# Patient Record
Sex: Male | Born: 1955 | Race: Black or African American | Hispanic: No | Marital: Married | Smoking: Current every day smoker
Health system: Southern US, Community
[De-identification: ages and names within clinical notes are randomized; demographics above are authoritative.]

## PROBLEM LIST (undated history)

## (undated) DIAGNOSIS — M1990 Unspecified osteoarthritis, unspecified site: Secondary | ICD-10-CM

## (undated) DIAGNOSIS — D649 Anemia, unspecified: Secondary | ICD-10-CM

## (undated) DIAGNOSIS — F419 Anxiety disorder, unspecified: Secondary | ICD-10-CM

## (undated) DIAGNOSIS — R7303 Prediabetes: Secondary | ICD-10-CM

## (undated) DIAGNOSIS — N186 End stage renal disease: Secondary | ICD-10-CM

## (undated) DIAGNOSIS — F1910 Other psychoactive substance abuse, uncomplicated: Secondary | ICD-10-CM

## (undated) DIAGNOSIS — M549 Dorsalgia, unspecified: Secondary | ICD-10-CM

## (undated) DIAGNOSIS — G4730 Sleep apnea, unspecified: Secondary | ICD-10-CM

## (undated) DIAGNOSIS — J189 Pneumonia, unspecified organism: Secondary | ICD-10-CM

## (undated) DIAGNOSIS — I2510 Atherosclerotic heart disease of native coronary artery without angina pectoris: Secondary | ICD-10-CM

## (undated) DIAGNOSIS — I10 Essential (primary) hypertension: Secondary | ICD-10-CM

## (undated) DIAGNOSIS — U071 COVID-19: Secondary | ICD-10-CM

## (undated) DIAGNOSIS — Z992 Dependence on renal dialysis: Secondary | ICD-10-CM

## (undated) DIAGNOSIS — R0600 Dyspnea, unspecified: Secondary | ICD-10-CM

## (undated) DIAGNOSIS — J449 Chronic obstructive pulmonary disease, unspecified: Secondary | ICD-10-CM

## (undated) DIAGNOSIS — J40 Bronchitis, not specified as acute or chronic: Secondary | ICD-10-CM

## (undated) DIAGNOSIS — I219 Acute myocardial infarction, unspecified: Secondary | ICD-10-CM

## (undated) DIAGNOSIS — N189 Chronic kidney disease, unspecified: Secondary | ICD-10-CM

## (undated) DIAGNOSIS — R011 Cardiac murmur, unspecified: Secondary | ICD-10-CM

## (undated) DIAGNOSIS — K219 Gastro-esophageal reflux disease without esophagitis: Secondary | ICD-10-CM

## (undated) HISTORY — PX: BACK SURGERY: SHX140

---

## 2011-08-12 DIAGNOSIS — K219 Gastro-esophageal reflux disease without esophagitis: Secondary | ICD-10-CM | POA: Insufficient documentation

## 2013-02-22 ENCOUNTER — Ambulatory Visit: Payer: Self-pay

## 2013-02-28 ENCOUNTER — Ambulatory Visit: Payer: Self-pay | Attending: Family Medicine

## 2013-03-25 ENCOUNTER — Ambulatory Visit: Payer: Self-pay

## 2013-05-05 ENCOUNTER — Ambulatory Visit: Payer: No Typology Code available for payment source | Admitting: Family Medicine

## 2013-05-20 ENCOUNTER — Emergency Department (HOSPITAL_COMMUNITY): Payer: No Typology Code available for payment source

## 2013-05-20 ENCOUNTER — Encounter (HOSPITAL_COMMUNITY): Payer: Self-pay | Admitting: Emergency Medicine

## 2013-05-20 ENCOUNTER — Emergency Department (HOSPITAL_COMMUNITY)
Admission: EM | Admit: 2013-05-20 | Discharge: 2013-05-20 | Disposition: A | Discharge status: Home / Self Care | Payer: No Typology Code available for payment source | Attending: Emergency Medicine | Admitting: Emergency Medicine

## 2013-05-20 DIAGNOSIS — R509 Fever, unspecified: Secondary | ICD-10-CM | POA: Insufficient documentation

## 2013-05-20 DIAGNOSIS — R05 Cough: Secondary | ICD-10-CM | POA: Insufficient documentation

## 2013-05-20 DIAGNOSIS — R52 Pain, unspecified: Secondary | ICD-10-CM | POA: Insufficient documentation

## 2013-05-20 DIAGNOSIS — F17200 Nicotine dependence, unspecified, uncomplicated: Secondary | ICD-10-CM | POA: Insufficient documentation

## 2013-05-20 DIAGNOSIS — B9789 Other viral agents as the cause of diseases classified elsewhere: Secondary | ICD-10-CM | POA: Insufficient documentation

## 2013-05-20 DIAGNOSIS — R059 Cough, unspecified: Secondary | ICD-10-CM | POA: Insufficient documentation

## 2013-05-20 DIAGNOSIS — Z8719 Personal history of other diseases of the digestive system: Secondary | ICD-10-CM | POA: Insufficient documentation

## 2013-05-20 DIAGNOSIS — B349 Viral infection, unspecified: Secondary | ICD-10-CM

## 2013-05-20 DIAGNOSIS — Z79899 Other long term (current) drug therapy: Secondary | ICD-10-CM | POA: Insufficient documentation

## 2013-05-20 DIAGNOSIS — I10 Essential (primary) hypertension: Secondary | ICD-10-CM | POA: Insufficient documentation

## 2013-05-20 DIAGNOSIS — R197 Diarrhea, unspecified: Secondary | ICD-10-CM

## 2013-05-20 DIAGNOSIS — Z8709 Personal history of other diseases of the respiratory system: Secondary | ICD-10-CM | POA: Insufficient documentation

## 2013-05-20 HISTORY — DX: Gastro-esophageal reflux disease without esophagitis: K21.9

## 2013-05-20 HISTORY — DX: Essential (primary) hypertension: I10

## 2013-05-20 HISTORY — DX: Dorsalgia, unspecified: M54.9

## 2013-05-20 HISTORY — DX: Bronchitis, not specified as acute or chronic: J40

## 2013-05-20 LAB — COMPREHENSIVE METABOLIC PANEL
ALT: 36 U/L (ref 0–53)
BUN: 31 mg/dL — ABNORMAL HIGH (ref 6–23)
CO2: 22 mEq/L (ref 19–32)
Calcium: 9 mg/dL (ref 8.4–10.5)
Chloride: 95 mEq/L — ABNORMAL LOW (ref 96–112)
Creatinine, Ser: 1.84 mg/dL — ABNORMAL HIGH (ref 0.50–1.35)
GFR calc Af Amer: 46 mL/min — ABNORMAL LOW (ref >90)
GFR calc non Af Amer: 40 mL/min — ABNORMAL LOW (ref >90)
Sodium: 130 mEq/L — ABNORMAL LOW (ref 135–145)
Total Bilirubin: 0.5 mg/dL (ref 0.3–1.2)
Total Protein: 7.6 g/dL (ref 6.0–8.3)

## 2013-05-20 LAB — GI PATHOGEN PANEL BY PCR, STOOL
Campylobacter by PCR: NEGATIVE
E coli (ETEC) LT/ST: NEGATIVE
E coli (STEC): NEGATIVE
G lamblia by PCR: NEGATIVE
Norovirus GI/GII: NEGATIVE
Rotavirus A by PCR: NEGATIVE
Salmonella by PCR: NEGATIVE

## 2013-05-20 LAB — URINE MICROSCOPIC-ADD ON

## 2013-05-20 LAB — URINALYSIS, ROUTINE W REFLEX MICROSCOPIC
Leukocytes, UA: NEGATIVE
Nitrite: NEGATIVE
Protein, ur: 30 mg/dL — AB
Specific Gravity, Urine: 1.02 (ref 1.005–1.030)
Urobilinogen, UA: 1 mg/dL (ref 0.0–1.0)

## 2013-05-20 LAB — CBC WITH DIFFERENTIAL/PLATELET
Eosinophils Absolute: 0 10*3/uL (ref 0.0–0.7)
Eosinophils Relative: 1 % (ref 0–5)
HCT: 40.2 % (ref 39.0–52.0)
Lymphocytes Relative: 12 % (ref 12–46)
Lymphs Abs: 0.5 10*3/uL — ABNORMAL LOW (ref 0.7–4.0)
MCH: 28.5 pg (ref 26.0–34.0)
MCV: 82.4 fL (ref 78.0–100.0)
Monocytes Absolute: 0.3 10*3/uL (ref 0.1–1.0)
Monocytes Relative: 7 % (ref 3–12)
Neutro Abs: 3.2 10*3/uL (ref 1.7–7.7)
Neutrophils Relative %: 79 % — ABNORMAL HIGH (ref 43–77)
RDW: 13.7 % (ref 11.5–15.5)
WBC Morphology: INCREASED
WBC: 4 10*3/uL (ref 4.0–10.5)

## 2013-05-20 MED ORDER — ONDANSETRON 8 MG PO TBDP: 8.0000 mg | ORAL_TABLET | Freq: Three times a day (TID) | ORAL | Status: DC | PRN

## 2013-05-20 MED ORDER — SODIUM CHLORIDE 0.9 % IV BOLUS (SEPSIS)
1000.0000 mL | Freq: Once | INTRAVENOUS | Status: AC
  Administered 2013-05-20: 1000 mL via INTRAVENOUS

## 2013-05-20 NOTE — ED Notes (Signed)
Bed: [id number] Expected date:  Expected time:  Means of arrival:  Comments: EMS/fever >104

## 2013-05-20 NOTE — ED Provider Notes (Signed)
CSN: [id number]     Arrival date & time 05/20/13  0236 History   First MD Initiated Contact with Patient 05/20/13 0254     Chief Complaint  Patient presents with  . Fever  . Diarrhea  . Generalized Body Aches   (Consider location/radiation/quality/duration/timing/severity/associated sxs/prior Treatment) HPI 57 yo male presents to the ER from home via EMS with complaint of fever, body aches, cough, and diarrhea.  Sxs ongoing for the last 3 days.  No travel, no sick contacts.  Pt received his flu shot 7 days ago.  No recent abx.  Pt has been taking tylenol for fevers.  When fever was almost 104, he and wife became concerned.  He reports recent steroid injection for his chronic back pain.  He reports decreased appetite over the last day.  No n/v.  Diffuse abd pain from frequent diarrhea, worse with coughing.  No prior h/o diverticulitis.  No blood in stool.   Past Medical History  Diagnosis Date  . Hypertension   . GERD (gastroesophageal reflux disease)   . Bronchitis   . Back pain    History reviewed. No pertinent past surgical history. History reviewed. No pertinent family history. History  Substance Use Topics  . Smoking status: Current Every Day Smoker    Types: Cigarettes  . Smokeless tobacco: Never Used  . Alcohol Use: 0.6 oz/week    1 Cans of beer per week     Comment: 24 oz can of beer per day    Review of Systems  All other systems reviewed and are negative.    Allergies  Review of patient's allergies indicates no known allergies.  Home Medications   Current Outpatient Rx  Name  Route  Sig  Dispense  Refill  . cloNIDine (CATAPRES) 0.1 MG tablet   Oral   Take 0.1 mg by mouth daily.         Marland Kitchen DM-APAP-CPM (CORICIDIN HBP FLU) 15-500-2 MG TABS   Oral   Take 1 tablet by mouth every 6 (six) hours as needed (flu symptoms).         . enalapril (VASOTEC) 20 MG tablet   Oral   Take 20 mg by mouth 2 (two) times daily.         Marland Kitchen etodolac (LODINE XL) 600 MG 24 hr  tablet   Oral   Take 600 mg by mouth daily.         Marland Kitchen gabapentin (NEURONTIN) 300 MG capsule   Oral   Take 300 mg by mouth 3 (three) times daily.         . hydrochlorothiazide (HYDRODIURIL) 25 MG tablet   Oral   Take 25 mg by mouth daily.          BP 141/66  Pulse 93  Temp(Src) 100.2 F (37.9 C) (Oral)  Resp 18  SpO2 94% Physical Exam  Nursing note and vitals reviewed. Constitutional: He is oriented to person, place, and time. He appears well-developed and well-nourished.  HENT:  Head: Normocephalic and atraumatic.  Right Ear: External ear normal.  Left Ear: External ear normal.  Nose: Nose normal.  Mouth/Throat: Oropharynx is clear and moist.  Eyes: Conjunctivae and EOM are normal. Pupils are equal, round, and reactive to light.  Neck: Normal range of motion. Neck supple. No JVD present. No tracheal deviation present. No thyromegaly present.  Cardiovascular: Normal rate, regular rhythm, normal heart sounds and intact distal pulses.  Exam reveals no gallop and no friction rub.   No murmur  heard. Pulmonary/Chest: Effort normal and breath sounds normal. No stridor. No respiratory distress. He has no wheezes. He has no rales. He exhibits no tenderness.  cough  Abdominal: Soft. He exhibits no distension and no mass. There is tenderness. There is no rebound and no guarding.  Increased bowel sounds, diffuse mild abd pain with palpation  Musculoskeletal: Normal range of motion. He exhibits no edema and no tenderness.  Lymphadenopathy:    He has no cervical adenopathy.  Neurological: He is alert and oriented to person, place, and time. He has normal reflexes. No cranial nerve deficit. He exhibits normal muscle tone. Coordination normal.  Skin: Skin is warm and dry. No rash noted. No erythema. No pallor.  Psychiatric: He has a normal mood and affect. His behavior is normal. Judgment and thought content normal.    ED Course  Procedures (including critical care time) Labs  Review Labs Reviewed  CBC WITH DIFFERENTIAL - Abnormal; Notable for the following:    Neutrophils Relative % 79 (*)    Lymphs Abs 0.5 (*)    All other components within normal limits  COMPREHENSIVE METABOLIC PANEL - Abnormal; Notable for the following:    Sodium 130 (*)    Chloride 95 (*)    Glucose, Bld 115 (*)    BUN 31 (*)    Creatinine, Ser 1.84 (*)    Albumin 3.4 (*)    GFR calc non Af Amer 40 (*)    GFR calc Af Amer 46 (*)    All other components within normal limits  URINALYSIS, ROUTINE W REFLEX MICROSCOPIC - Abnormal; Notable for the following:    APPearance CLOUDY (*)    Bilirubin Urine LARGE (*)    Protein, ur 30 (*)    All other components within normal limits  URINE MICROSCOPIC-ADD ON - Abnormal; Notable for the following:    Casts GRANULAR CAST (*)    All other components within normal limits  CULTURE, BLOOD (ROUTINE X 2)  CULTURE, BLOOD (ROUTINE X 2)  GI PATHOGEN PANEL BY PCR, STOOL   Imaging Review Dg Chest 2 View  05/20/2013   *RADIOLOGY REPORT*  Clinical Data: Fever and cough.  CHEST - 2 VIEW  Comparison: None.  Findings: The lungs are well-aerated and clear.  There is no evidence of focal opacification, pleural effusion or pneumothorax.  The heart is normal in size; the mediastinal contour is within normal limits.  No acute osseous abnormalities are seen. Anterior bridging osteophytes are noted along the thoracic spine, compatible with DISH.  IMPRESSION: No acute cardiopulmonary process seen.   Original Report Authenticated By: Santa Lighter, M.D.    EKG Interpretation   None       MDM   1. Viral syndrome   2. Fever   3. Diarrhea    57 yo male with fever, diarrhea, myalgias.  Will check labs, cxr.  Will give ns bolus.  Fever already improving with tylenol given by EMS  6:02 AM Pt feeling better.  Blood cultures and stool studies pending.  Some increased bands and elevated bun/creatinine.  No prior to compare.  Pt given precautions for return, reports  he is seen at [facility] in [place].    Kalman Drape, MD 05/20/13 [phone]

## 2013-05-20 NOTE — ED Notes (Signed)
Pt c/o fever, body aches, chills, diarrhea which started about 3 days ago. Pt states he had flu vaccine last week. Pt was given tylenol 500 mg po per ems for fever of 103.2. Pt also took 325mg  tylenol at [place] himself with benadryl.

## 2013-05-26 LAB — CULTURE, BLOOD (ROUTINE X 2): Culture: NO GROWTH

## 2013-05-30 ENCOUNTER — Ambulatory Visit: Payer: No Typology Code available for payment source | Attending: Internal Medicine | Admitting: Internal Medicine

## 2013-05-30 ENCOUNTER — Encounter: Payer: Self-pay | Admitting: Internal Medicine

## 2013-05-30 VITALS — BP 156/81 | HR 84 | Temp 98.4°F | Resp 16 | Ht 69.0 in | Wt 201.0 lb

## 2013-05-30 DIAGNOSIS — R52 Pain, unspecified: Secondary | ICD-10-CM

## 2013-05-30 DIAGNOSIS — G609 Hereditary and idiopathic neuropathy, unspecified: Secondary | ICD-10-CM

## 2013-05-30 DIAGNOSIS — G629 Polyneuropathy, unspecified: Secondary | ICD-10-CM | POA: Insufficient documentation

## 2013-05-30 DIAGNOSIS — I10 Essential (primary) hypertension: Secondary | ICD-10-CM | POA: Insufficient documentation

## 2013-05-30 DIAGNOSIS — K029 Dental caries, unspecified: Secondary | ICD-10-CM | POA: Insufficient documentation

## 2013-05-30 DIAGNOSIS — N189 Chronic kidney disease, unspecified: Secondary | ICD-10-CM | POA: Insufficient documentation

## 2013-05-30 DIAGNOSIS — M545 Low back pain: Secondary | ICD-10-CM

## 2013-05-30 DIAGNOSIS — M5412 Radiculopathy, cervical region: Secondary | ICD-10-CM | POA: Insufficient documentation

## 2013-05-30 DIAGNOSIS — G8929 Other chronic pain: Secondary | ICD-10-CM | POA: Insufficient documentation

## 2013-05-30 DIAGNOSIS — I129 Hypertensive chronic kidney disease with stage 1 through stage 4 chronic kidney disease, or unspecified chronic kidney disease: Secondary | ICD-10-CM | POA: Insufficient documentation

## 2013-05-30 MED ORDER — TRAMADOL HCL 50 MG PO TABS: 50.0000 mg | ORAL_TABLET | Freq: Three times a day (TID) | ORAL | Status: DC | PRN

## 2013-05-30 NOTE — Progress Notes (Signed)
Patient ID: Ronald Reeves, male   DOB: 1956-07-05, 57 y.o.   MRN: [id number] Patient Demographics  Ronald Reeves, is a 57 y.o. male  [id number]  [id number]  DOB - Jul 10, 1956  CC:  Chief Complaint  Patient presents with  . Establish Care       HPI: Ronald Reeves is a 57 y.o. male here today to establish medical care. Patient has extensive medical history significant for hypertension, GERD, bronchitis, chronic back pain, chronic kidney disease and peripheral neuropathy. His major concern today is for referral to orthopedic surgery for his low back pain. He has been advised in the past that he needs fusion surgery, he thinks he is ready now. He also wants a referral to a dentist. His current medications are listed below. He smokes about half a pack per day, drinks alcohol daily.  Patient has No headache, No chest pain, No abdominal pain - No Nausea, No new weakness tingling or numbness, No Cough - SOB.  No Known Allergies Past Medical History  Diagnosis Date  . Hypertension   . GERD (gastroesophageal reflux disease)   . Bronchitis   . Back pain    Current Outpatient Prescriptions on File Prior to Visit  Medication Sig Dispense Refill  . cloNIDine (CATAPRES) 0.1 MG tablet Take 0.1 mg by mouth daily.      . enalapril (VASOTEC) 20 MG tablet Take 20 mg by mouth 2 (two) times daily.      Marland Kitchen etodolac (LODINE XL) 600 MG 24 hr tablet Take 600 mg by mouth daily.      Marland Kitchen gabapentin (NEURONTIN) 300 MG capsule Take 300 mg by mouth 3 (three) times daily.      . hydrochlorothiazide (HYDRODIURIL) 25 MG tablet Take 25 mg by mouth daily.      Marland Kitchen DM-APAP-CPM (CORICIDIN HBP FLU) 15-500-2 MG TABS Take 1 tablet by mouth every 6 (six) hours as needed (flu symptoms).      . ondansetron (ZOFRAN ODT) 8 MG disintegrating tablet Take 1 tablet (8 mg total) by mouth every 8 (eight) hours as needed for nausea.  20 tablet  0   No current facility-administered medications on file prior to visit.   History reviewed. No  pertinent family history. History   Social History  . Marital Status: Single    Spouse Name: N/A    Number of Children: N/A  . Years of Education: N/A   Occupational History  . Not on file.   Social History Main Topics  . Smoking status: Current Every Day Smoker -- 0.50 packs/day    Types: Cigarettes  . Smokeless tobacco: Never Used  . Alcohol Use: 0.6 oz/week    1 Cans of beer per week     Comment: 24 oz can of beer per day  . Drug Use: Yes    Special: Cocaine     Comment: pt states he uses crack cocaine at least 2xweek.   Marland Kitchen Sexual Activity: Yes   Other Topics Concern  . Not on file   Social History Narrative  . No narrative on file    Review of Systems: Constitutional: Negative for fever, chills, diaphoresis, activity change, appetite change and fatigue. HENT: Negative for ear pain, nosebleeds, congestion, facial swelling, rhinorrhea, neck pain, neck stiffness and ear discharge.  Eyes: Negative for pain, discharge, redness, itching and visual disturbance. Respiratory: Negative for cough, choking, chest tightness, shortness of breath, wheezing and stridor.  Cardiovascular: Negative for chest pain, palpitations and leg swelling. Gastrointestinal: Negative for abdominal distention.  Genitourinary: Negative for dysuria, urgency, frequency, hematuria, flank pain, decreased urine volume, difficulty urinating and dyspareunia.  Musculoskeletal: Negative for back pain, joint swelling, arthralgia and gait problem. Neurological: Negative for dizziness, tremors, seizures, syncope, facial asymmetry, speech difficulty, weakness, light-headedness, numbness and headaches.  Hematological: Negative for adenopathy. Does not bruise/bleed easily. Psychiatric/Behavioral: Negative for hallucinations, behavioral problems, confusion, dysphoric mood, decreased concentration and agitation.    Objective:   Filed Vitals:   05/30/13 1728  BP: 156/81  Pulse: 84  Temp: 98.4 F (36.9 C)  Resp:  16    Physical Exam: Constitutional: Patient appears well-developed and well-nourished. No distress. HENT: Normocephalic, atraumatic, External right and left ear normal. Oropharynx is clear and moist. Dental caries Eyes: Conjunctivae and EOM are normal. PERRLA, no scleral icterus. Neck: Normal ROM. Neck supple. No JVD. No tracheal deviation. No thyromegaly. CVS: RRR, S1/S2 +, no murmurs, no gallops, no carotid bruit.  Pulmonary: Effort and breath sounds normal, no stridor, rhonchi, wheezes, rales.  Abdominal: Soft. BS +, no distension, tenderness, rebound or guarding.  Musculoskeletal: Point tenderness at the low lumbar spine area  Lymphadenopathy: No lymphadenopathy noted, cervical, inguinal or axillary Neuro: Alert. Normal reflexes, muscle tone coordination. No cranial nerve deficit. Skin: Skin is warm and dry. No rash noted. Not diaphoretic. No erythema. No pallor. Psychiatric: Normal mood and affect. Behavior, judgment, thought content normal.  Lab Results  Component Value Date   WBC 4.0 05/20/2013   HGB 13.9 05/20/2013   HCT 40.2 05/20/2013   MCV 82.4 05/20/2013   PLT PLATELET CLUMPS NOTED ON SMEAR 05/20/2013   Lab Results  Component Value Date   CREATININE 1.84* 05/20/2013   BUN 31* 05/20/2013   NA 130* 05/20/2013   K 3.7 05/20/2013   CL 95* 05/20/2013   CO2 22 05/20/2013    No results found for this basename: HGBA1C   Lipid Panel  No results found for this basename: chol, trig, hdl, cholhdl, vldl, ldlcalc       Assessment and plan:   Patient Active Problem List   Diagnosis Date Noted  . Peripheral neuropathy 05/30/2013  . Chronic kidney disease 05/30/2013  . Essential hypertension, malignant 05/30/2013  . Cervical radiculopathy 05/30/2013  . Acute exacerbation of chronic low back pain 05/30/2013  . Dental caries 05/30/2013    Plan: Comprehensive metabolic panel today to reevaluate for the previous abnormal results  Patient encouraged to be compliant  with medications Blood pressure goals would discuss in the clinic today as well as complications of uncontrolled hypertension and consequences of noncompliance Patient was extensively counseled about smoking cessation Patient was counseled about nutrition and exercise  Ambulatory referral to orthopedic surgery Ambulatory referral to dentistry     Follow up in 2 months or when necessary  The patient was given clear instructions to go to ER or return to medical center if symptoms don't improve, worsen or new problems develop. The patient verbalized understanding. The patient was told to call to get lab results if they haven't heard anything in the next week.     Angelica Chessman, MD, [place], [place], [place], [place]   05/30/2013, 5:54 PM

## 2013-05-30 NOTE — Patient Instructions (Signed)
Back Pain, Adult Low back pain is very common. About 1 in 5 people have back pain.The cause of low back pain is rarely dangerous. The pain often gets better over time.About half of people with a sudden onset of back pain feel better in just 2 weeks. About 8 in 10 people feel better by 6 weeks.  CAUSES Some common causes of back pain include:  Strain of the muscles or ligaments supporting the spine.  Wear and tear (degeneration) of the spinal discs.  Arthritis.  Direct injury to the back. DIAGNOSIS Most of the time, the direct cause of low back pain is not known.However, back pain can be treated effectively even when the exact cause of the pain is unknown.Answering your caregiver's questions about your overall health and symptoms is one of the most accurate ways to make sure the cause of your pain is not dangerous. If your caregiver needs more information, he or she may order lab work or imaging tests (X-rays or MRIs).However, even if imaging tests show changes in your back, this usually does not require surgery. HOME CARE INSTRUCTIONS For many people, back pain returns.Since low back pain is rarely dangerous, it is often a condition that people can learn to [facility] their own.   Remain active. It is stressful on the back to sit or stand in one place. Do not sit, drive, or stand in one place for more than 30 minutes at a time. Take short walks on level surfaces as soon as pain allows.Try to increase the length of time you walk each day.  Do not stay in bed.Resting more than 1 or 2 days can delay your recovery.  Do not avoid exercise or work.Your body is made to move.It is not dangerous to be active, even though your back may hurt.Your back will likely heal faster if you return to being active before your pain is gone.  Pay attention to your body when you bend and lift. Many people have less discomfortwhen lifting if they bend their knees, keep the load close to their bodies,and  avoid twisting. Often, the most comfortable positions are those that put less stress on your recovering back.  Find a comfortable position to sleep. Use a firm mattress and lie on your side with your knees slightly bent. If you lie on your back, put a pillow under your knees.  Only take over-the-counter or prescription medicines as directed by your caregiver. Over-the-counter medicines to reduce pain and inflammation are often the most helpful.Your caregiver may prescribe muscle relaxant drugs.These medicines help dull your pain so you can more quickly return to your normal activities and healthy exercise.  Put ice on the injured area.  Put ice in a plastic bag.  Place a towel between your skin and the bag.  Leave the ice on for 15-20 minutes, 3-4 times a day for the first 2 to 3 days. After that, ice and heat may be alternated to reduce pain and spasms.  Ask your caregiver about trying back exercises and gentle massage. This may be of some benefit.  Avoid feeling anxious or stressed.Stress increases muscle tension and can worsen back pain.It is important to recognize when you are anxious or stressed and learn ways to manage it.Exercise is a great option. SEEK MEDICAL CARE IF:  You have pain that is not relieved with rest or medicine.  You have pain that does not improve in 1 week.  You have new symptoms.  You are generally not feeling well. SEEK  IMMEDIATE MEDICAL CARE IF:   You have pain that radiates from your back into your legs.  You develop new bowel or bladder control problems.  You have unusual weakness or numbness in your arms or legs.  You develop nausea or vomiting.  You develop abdominal pain.  You feel faint. Document Released: 07/28/2005 Document Revised: 01/27/2012 Document Reviewed: 12/16/2010 [facility] Patient Information 2014 [place], [place]. Cervical Radiculopathy Cervical radiculopathy happens when a nerve in the neck is pinched or bruised by a slipped  (herniated) disk or by arthritic changes in the bones of the cervical spine. This can occur due to an injury or as part of the normal aging process. Pressure on the cervical nerves can cause pain or numbness that runs from your neck all the way down into your arm and fingers. CAUSES  There are many possible causes, including:  Injury.  Muscle tightness in the neck from overuse.  Swollen, painful joints (arthritis).  Breakdown or degeneration in the bones and joints of the spine (spondylosis) due to aging.  Bone spurs that may develop near the cervical nerves. SYMPTOMS  Symptoms include pain, weakness, or numbness in the affected arm and hand. Pain can be severe or irritating. Symptoms may be worse when extending or turning the neck. DIAGNOSIS  Your caregiver will ask about your symptoms and do a physical exam. He or she may test your strength and reflexes. X-rays, CT scans, and MRI scans may be needed in cases of injury or if the symptoms do not go away after a period of time. Electromyography (EMG) or nerve conduction testing may be done to study how your nerves and muscles are working. TREATMENT  Your caregiver may recommend certain exercises to help relieve your symptoms. Cervical radiculopathy can, and often does, get better with time and treatment. If your problems continue, treatment options may include:  Wearing a soft collar for short periods of time.  Physical therapy to strengthen the neck muscles.  Medicines, such as nonsteroidal anti-inflammatory drugs (NSAIDs), oral corticosteroids, or spinal injections.  Surgery. Different types of surgery may be done depending on the cause of your problems. HOME CARE INSTRUCTIONS   Put ice on the affected area.  Put ice in a plastic bag.  Place a towel between your skin and the bag.  Leave the ice on for 15-20 minutes, 3-4 times a day or as directed by your caregiver.  If ice does not help, you can try using heat. Take a warm  shower or bath, or use a hot water bottle as directed by your caregiver.  You may try a gentle neck and shoulder massage.  Use a flat pillow when you sleep.  Only take over-the-counter or prescription medicines for pain, discomfort, or fever as directed by your caregiver.  If physical therapy was prescribed, follow your caregiver's directions.  If a soft collar was prescribed, use it as directed. SEEK IMMEDIATE MEDICAL CARE IF:   Your pain gets much worse and cannot be controlled with medicines.  You have weakness or numbness in your hand, arm, face, or leg.  You have a high fever or a stiff, rigid neck.  You lose bowel or bladder control (incontinence).  You have trouble with walking, balance, or speaking. MAKE SURE YOU:   Understand these instructions.  Will watch your condition.  Will get help right away if you are not doing well or get worse. Document Released: 04/22/2001 Document Revised: 10/20/2011 Document Reviewed: 03/11/2011 [facility] Patient Information 2014 [place], [place].

## 2013-05-30 NOTE — Progress Notes (Signed)
Pt is here to establish care. Pt is requesting a referral to a dentist and an orthopedic doctor. Pt has back surgery scheduled of Nov 17th. Pt has pinched nerves in his neck and lower back. His lower back has been giving him pain since 2005.  Now the pain is in his arms and knees.

## 2013-05-31 ENCOUNTER — Encounter: Payer: Self-pay | Admitting: Internal Medicine

## 2013-05-31 LAB — CMP AND LIVER
Albumin: 4.3 g/dL (ref 3.5–5.2)
Alkaline Phosphatase: 101 U/L (ref 39–117)
BUN: 16 mg/dL (ref 6–23)
Calcium: 9.5 mg/dL (ref 8.4–10.5)
Chloride: 103 mEq/L (ref 96–112)
Creat: 1.23 mg/dL (ref 0.50–1.35)
Glucose, Bld: 111 mg/dL — ABNORMAL HIGH (ref 70–99)
Indirect Bilirubin: 0.3 mg/dL (ref 0.0–0.9)
Potassium: 4.4 mEq/L (ref 3.5–5.3)

## 2013-06-03 ENCOUNTER — Telehealth: Payer: Self-pay

## 2013-06-03 NOTE — Telephone Encounter (Signed)
Patient is aware of his lab results 

## 2013-06-03 NOTE — Telephone Encounter (Signed)
Message copied by Dorothe Pea on Fri Jun 03, 2013  4:46 PM ------      Message from: Tresa Garter      Created: Fri Jun 03, 2013  4:25 PM       Please inform patient that his lab results come back mostly normal ------

## 2013-07-11 ENCOUNTER — Ambulatory Visit: Payer: No Typology Code available for payment source | Admitting: Sports Medicine

## 2013-09-01 ENCOUNTER — Ambulatory Visit: Payer: No Typology Code available for payment source | Attending: Internal Medicine | Admitting: Internal Medicine

## 2013-09-01 ENCOUNTER — Ambulatory Visit: Payer: No Typology Code available for payment source | Attending: Internal Medicine

## 2013-09-01 ENCOUNTER — Encounter: Payer: Self-pay | Admitting: Internal Medicine

## 2013-09-01 VITALS — BP 172/102 | HR 80 | Temp 97.6°F | Resp 16 | Wt 200.0 lb

## 2013-09-01 DIAGNOSIS — M25569 Pain in unspecified knee: Secondary | ICD-10-CM | POA: Insufficient documentation

## 2013-09-01 DIAGNOSIS — I10 Essential (primary) hypertension: Secondary | ICD-10-CM | POA: Insufficient documentation

## 2013-09-01 LAB — COMPLETE METABOLIC PANEL WITH GFR
ALT: 20 U/L (ref 0–53)
AST: 20 U/L (ref 0–37)
Albumin: 4.2 g/dL (ref 3.5–5.2)
Alkaline Phosphatase: 107 U/L (ref 39–117)
BUN: 12 mg/dL (ref 6–23)
CHLORIDE: 103 meq/L (ref 96–112)
CO2: 28 mEq/L (ref 19–32)
CREATININE: 1.08 mg/dL (ref 0.50–1.35)
Calcium: 9.3 mg/dL (ref 8.4–10.5)
GFR, Est African American: 89 mL/min
GFR, Est Non African American: 77 mL/min
GLUCOSE: 113 mg/dL — AB (ref 70–99)
Potassium: 4.3 mEq/L (ref 3.5–5.3)
Sodium: 137 mEq/L (ref 135–145)
Total Bilirubin: 0.5 mg/dL (ref 0.3–1.2)
Total Protein: 7.5 g/dL (ref 6.0–8.3)

## 2013-09-01 MED ORDER — CLONIDINE HCL 0.1 MG PO TABS
0.2000 mg | ORAL_TABLET | Freq: Once | ORAL | Status: AC
  Administered 2013-09-01: 0.2 mg via ORAL

## 2013-09-01 MED ORDER — HYDROCHLOROTHIAZIDE 25 MG PO TABS: 25.0000 mg | ORAL_TABLET | Freq: Every day | ORAL | Status: DC

## 2013-09-01 MED ORDER — AMOXICILLIN-POT CLAVULANATE 875-125 MG PO TABS: 1.0000 | ORAL_TABLET | Freq: Two times a day (BID) | ORAL | Status: DC

## 2013-09-01 MED ORDER — CLONIDINE HCL 0.1 MG PO TABS: 0.1000 mg | ORAL_TABLET | Freq: Every day | ORAL | Status: DC

## 2013-09-01 MED ORDER — ENALAPRIL MALEATE 20 MG PO TABS: 20.0000 mg | ORAL_TABLET | Freq: Two times a day (BID) | ORAL | Status: DC

## 2013-09-01 NOTE — Progress Notes (Signed)
Patient ID: Ronald Reeves, male   DOB: 22-Jan-1956, 58 y.o.   MRN: [id number]   CC:  HPI: 58 year old male with a history of chronic back pain, chronic bilateral knee pain, who is here for a followup. He states that he had a laminectomy L2 L3-L4 at the [facility] in [place]. He has a primary care doctor at the [place]. He is trying to extend the amount of his disability from the [place], and needs to see an orthopedic surgeon outside of the [place] system who will give an independent opinion about the patient's condition of the knee, and possible disability. He claims that he has injured his left leg in the line of duty. He has a limp, he uses a brace and a cane to ambulate. He also claims that he has right knee pain. According to him he had plain radiographs done at the [facility]. Currently he is on gabapentin and etodolac for his back surgery.    No Known Allergies Past Medical History  Diagnosis Date  . Hypertension   . GERD (gastroesophageal reflux disease)   . Bronchitis   . Back pain    Current Outpatient Prescriptions on File Prior to Visit  Medication Sig Dispense Refill  . DM-APAP-CPM (CORICIDIN HBP FLU) 15-500-2 MG TABS Take 1 tablet by mouth every 6 (six) hours as needed (flu symptoms).      Marland Kitchen etodolac (LODINE XL) 600 MG 24 hr tablet Take 600 mg by mouth daily.      Marland Kitchen gabapentin (NEURONTIN) 300 MG capsule Take 300 mg by mouth 3 (three) times daily.      . ondansetron (ZOFRAN ODT) 8 MG disintegrating tablet Take 1 tablet (8 mg total) by mouth every 8 (eight) hours as needed for nausea.  20 tablet  0  . traMADol (ULTRAM) 50 MG tablet Take 1 tablet (50 mg total) by mouth every 8 (eight) hours as needed for pain.  45 tablet  0   No current facility-administered medications on file prior to visit.   History reviewed. No pertinent family history. History   Social History  . Marital Status: Single    Spouse Name: N/A    Number of Children: N/A  . Years of Education: N/A   Occupational History   . Not on file.   Social History Main Topics  . Smoking status: Current Every Day Smoker -- 0.50 packs/day    Types: Cigarettes  . Smokeless tobacco: Never Used  . Alcohol Use: 0.6 oz/week    1 Cans of beer per week     Comment: 24 oz can of beer per day  . Drug Use: Yes    Special: Cocaine     Comment: pt states he uses crack cocaine at least 2xweek.   Marland Kitchen Sexual Activity: Yes   Other Topics Concern  . Not on file   Social History Narrative  . No narrative on file    Review of Systems  Constitutional: As in history of present illness HENT: Negative for ear pain, nosebleeds, congestion, facial swelling, rhinorrhea, neck pain, neck stiffness and ear discharge.   Eyes: Negative for pain, discharge, redness, itching and visual disturbance.  Respiratory: Negative for cough, choking, chest tightness, shortness of breath, wheezing and stridor.   Cardiovascular: Negative for chest pain, palpitations and leg swelling.  Gastrointestinal: Negative for abdominal distention.  Genitourinary: Negative for dysuria, urgency, frequency, hematuria, flank pain, decreased urine volume, difficulty urinating and dyspareunia.  Musculoskeletal: As in history of present illness Neurological: Negative for  dizziness, tremors, seizures, syncope, facial asymmetry, speech difficulty, weakness, light-headedness, numbness and headaches.  Hematological: Negative for adenopathy. Does not bruise/bleed easily.  Psychiatric/Behavioral: Negative for hallucinations, behavioral problems, confusion, dysphoric mood, decreased concentration and agitation.    Objective:   Filed Vitals:   09/01/13 1559  BP: 172/102  Pulse: 80  Temp: 97.6 F (36.4 C)  Resp: 16    Physical Exam  Constitutional: Appears well-developed and well-nourished. No distress.  HENT: Normocephalic. External right and left ear normal. Oropharynx is clear and moist.  Eyes: Conjunctivae and EOM are normal. PERRLA, no scleral icterus.  Neck:  Normal ROM. Neck supple. No JVD. No tracheal deviation. No thyromegaly.  CVS: RRR, S1/S2 +, no murmurs, no gallops, no carotid bruit.  Pulmonary: Effort and breath sounds normal, no stridor, rhonchi, wheezes, rales.  Abdominal: Soft. BS +,  no distension, tenderness, rebound or guarding.  Musculoskeletal: Normal range of motion. No edema and no tenderness.  Lymphadenopathy: No lymphadenopathy noted, cervical, inguinal. Neuro: Alert. Normal reflexes, muscle tone coordination. No cranial nerve deficit. Skin: Skin is warm and dry. No rash noted. Not diaphoretic. No erythema. No pallor.  Psychiatric: Normal mood and affect. Behavior, judgment, thought content normal.   Lab Results  Component Value Date   WBC 4.0 05/20/2013   HGB 13.9 05/20/2013   HCT 40.2 05/20/2013   MCV 82.4 05/20/2013   PLT PLATELET CLUMPS NOTED ON SMEAR 05/20/2013   Lab Results  Component Value Date   CREATININE 1.23 05/30/2013   BUN 16 05/30/2013   NA 137 05/30/2013   K 4.4 05/30/2013   CL 103 05/30/2013   CO2 25 05/30/2013    No results found for this basename: HGBA1C   Lipid Panel  No results found for this basename: chol, trig, hdl, cholhdl, vldl, ldlcalc       Assessment and plan:   Patient Active Problem List   Diagnosis Date Noted  . Peripheral neuropathy 05/30/2013  . Chronic kidney disease 05/30/2013  . Essential hypertension, malignant 05/30/2013  . Cervical radiculopathy 05/30/2013  . Acute exacerbation of chronic low back pain 05/30/2013  . Dental caries 05/30/2013       Bilateral knee pain Will not repeat x-rays of the patient had x-ray in October at the [facility] He has an appointment with an orthopedic surgeon at the [facility] in [place] on 2/3 He is requesting an orthopedic surgeon outside of the hospital Provide him the referral Pain appears to be controlled  Hypertension uncontrolled Give 0.2 mg of clonidine Refills on his antihypertensive medications Patient endorses  compliance Renal panel   Dentist referral for dental cavity, Augmentin for 10 days   Followup in 2-3 months    The patient was given clear instructions to go to ER or return to medical center if symptoms don't improve, worsen or new problems develop. The patient verbalized understanding. The patient was told to call to get any lab results if not heard anything in the next week.

## 2013-09-01 NOTE — Progress Notes (Signed)
Patient here for follow up Needs referral to ortho and dentist

## 2013-09-03 ENCOUNTER — Telehealth: Payer: Self-pay | Admitting: *Deleted

## 2013-09-03 NOTE — Telephone Encounter (Signed)
Left a voicemail for pt to give us a call back. 

## 2013-09-03 NOTE — Telephone Encounter (Signed)
Message copied by Arlind Klingerman, [place] R on Sat Sep 03, 2013  9:49 AM ------      Message from: Allyson Sabal MD, Ascencion Dike      Created: Fri Sep 02, 2013  5:13 PM       Notify patient if labs are normal ------

## 2013-09-19 ENCOUNTER — Ambulatory Visit: Payer: No Typology Code available for payment source | Admitting: Family Medicine

## 2013-10-07 ENCOUNTER — Ambulatory Visit: Payer: No Typology Code available for payment source | Admitting: Sports Medicine

## 2013-10-17 ENCOUNTER — Ambulatory Visit: Payer: No Typology Code available for payment source | Admitting: Sports Medicine

## 2013-11-30 ENCOUNTER — Ambulatory Visit: Payer: No Typology Code available for payment source | Admitting: Internal Medicine

## 2014-07-05 ENCOUNTER — Emergency Department (HOSPITAL_COMMUNITY)
Admission: EM | Admit: 2014-07-05 | Discharge: 2014-07-05 | Disposition: A | Discharge status: Home / Self Care | Payer: Non-veteran care | Attending: Emergency Medicine | Admitting: Emergency Medicine

## 2014-07-05 ENCOUNTER — Encounter (HOSPITAL_COMMUNITY): Payer: Self-pay | Admitting: Emergency Medicine

## 2014-07-05 DIAGNOSIS — Z79899 Other long term (current) drug therapy: Secondary | ICD-10-CM | POA: Insufficient documentation

## 2014-07-05 DIAGNOSIS — M545 Low back pain: Secondary | ICD-10-CM | POA: Insufficient documentation

## 2014-07-05 DIAGNOSIS — Z792 Long term (current) use of antibiotics: Secondary | ICD-10-CM | POA: Insufficient documentation

## 2014-07-05 DIAGNOSIS — IMO0001 Reserved for inherently not codable concepts without codable children: Secondary | ICD-10-CM

## 2014-07-05 DIAGNOSIS — Z9889 Other specified postprocedural states: Secondary | ICD-10-CM | POA: Insufficient documentation

## 2014-07-05 DIAGNOSIS — Z8719 Personal history of other diseases of the digestive system: Secondary | ICD-10-CM | POA: Insufficient documentation

## 2014-07-05 DIAGNOSIS — Z791 Long term (current) use of non-steroidal anti-inflammatories (NSAID): Secondary | ICD-10-CM | POA: Insufficient documentation

## 2014-07-05 DIAGNOSIS — R030 Elevated blood-pressure reading, without diagnosis of hypertension: Secondary | ICD-10-CM

## 2014-07-05 DIAGNOSIS — Z720 Tobacco use: Secondary | ICD-10-CM | POA: Insufficient documentation

## 2014-07-05 DIAGNOSIS — Z8709 Personal history of other diseases of the respiratory system: Secondary | ICD-10-CM | POA: Insufficient documentation

## 2014-07-05 DIAGNOSIS — I10 Essential (primary) hypertension: Secondary | ICD-10-CM | POA: Insufficient documentation

## 2014-07-05 DIAGNOSIS — G8929 Other chronic pain: Secondary | ICD-10-CM

## 2014-07-05 DIAGNOSIS — M549 Dorsalgia, unspecified: Secondary | ICD-10-CM

## 2014-07-05 MED ORDER — OXYCODONE HCL 5 MG PO TABS: 5.0000 mg | ORAL_TABLET | ORAL | Status: DC | PRN

## 2014-07-05 NOTE — ED Provider Notes (Signed)
CSN: [id number]     Arrival date & time 07/05/14  1649 History   First MD Initiated Contact with Patient 07/05/14 1713     Chief Complaint  Patient presents with  . Back Pain     (Consider location/radiation/quality/duration/timing/severity/associated sxs/prior Treatment) HPI Comments: Patient with a history of chronic lower back pain presents today with lower back pain.  Pain has been present for years, but worsened just prior to arrival when he was arrested by [employer] for a probation violation.  He reports that during the arrest he was handcuffed, which made his back pain worse and also pain in his left arm.  Back pain radiates down his left leg, which he also reports is chronic.  He does have a history of previous laminectomy of the lumbar spine that was done at the [facility] last year.  He also reports that he had surgery on his cervical spine two months ago.  He denies any new numbness or tingling.  He denies fever, chills, or bowel/bladder incontinence.  He has not taken anything for pain prior to arrival.  He states that he has taken Oxycodone for his pain in the past, but ran out.  Patient also found to have an elevated blood pressure, but states that he has not taken his medication in 2 days.  Denies headache, vision changes, or chest pain.    Patient is a 58 y.o. male presenting with back pain. The history is provided by the patient.  Back Pain   Past Medical History  Diagnosis Date  . Hypertension   . GERD (gastroesophageal reflux disease)   . Bronchitis   . Back pain    History reviewed. No pertinent past surgical history. History reviewed. No pertinent family history. History  Substance Use Topics  . Smoking status: Current Every Day Smoker -- 0.50 packs/day    Types: Cigarettes  . Smokeless tobacco: Never Used  . Alcohol Use: 0.6 oz/week    1 Cans of beer per week     Comment: 24 oz can of beer per day    Review of Systems  Musculoskeletal: Positive for back pain.   All other systems reviewed and are negative.     Allergies  Review of patient's allergies indicates no known allergies.  Home Medications   Prior to Admission medications   Medication Sig Start Date End Date Taking? Authorizing Provider  cloNIDine (CATAPRES) 0.1 MG tablet Take 1 tablet (0.1 mg total) by mouth daily. 09/01/13  Yes Reyne Dumas, MD  enalapril (VASOTEC) 20 MG tablet Take 1 tablet (20 mg total) by mouth 2 (two) times daily. 09/01/13  Yes Reyne Dumas, MD  etodolac (LODINE XL) 600 MG 24 hr tablet Take 600 mg by mouth daily.   Yes Historical Provider, MD  gabapentin (NEURONTIN) 300 MG capsule Take 300 mg by mouth 3 (three) times daily.   Yes Historical Provider, MD  hydrochlorothiazide (HYDRODIURIL) 25 MG tablet Take 1 tablet (25 mg total) by mouth daily. 09/01/13  Yes Reyne Dumas, MD  ondansetron (ZOFRAN ODT) 8 MG disintegrating tablet Take 1 tablet (8 mg total) by mouth every 8 (eight) hours as needed for nausea. 05/20/13  Yes Kalman Drape, MD  oxycodone (OXY-IR) 5 MG capsule Take 5 mg by mouth every 4 (four) hours as needed for pain.   Yes Historical Provider, MD  amoxicillin-clavulanate (AUGMENTIN) 875-125 MG per tablet Take 1 tablet by mouth 2 (two) times daily. Patient not taking: Reported on 07/05/2014 09/01/13   Reyne Dumas,  MD  traMADol (ULTRAM) 50 MG tablet Take 1 tablet (50 mg total) by mouth every 8 (eight) hours as needed for pain. Patient not taking: Reported on 07/05/2014 05/30/13   Tresa Garter, MD   BP 189/86 mmHg  Pulse 67  Temp(Src) 97.6 F (36.4 C) (Oral)  Resp 18  SpO2 100% Physical Exam  Constitutional: He appears well-developed and well-nourished.  HENT:  Head: Normocephalic and atraumatic.  Mouth/Throat: Oropharynx is clear and moist.  Neck: Normal range of motion. Neck supple.  Cardiovascular: Normal rate, regular rhythm and normal heart sounds.   Pulmonary/Chest: Effort normal and breath sounds normal.  Musculoskeletal: Normal range of  motion.       Lumbar back: He exhibits tenderness and bony tenderness. He exhibits normal range of motion, no swelling, no edema and no deformity.  Decreased ROM of the left shoulder with abduction, which patient reports is baseline.   Neurological: He is alert. Gait normal.  Distal sensation of both feet intact Muscle strength lower extremities equal and symmetric.  Skin: Skin is warm and dry.  Nursing note and vitals reviewed.   ED Course  Procedures (including critical care time) Labs Review Labs Reviewed - No data to display  Imaging Review No results found.   EKG Interpretation None      MDM   Final diagnoses:  None   Patient with chronic lower back pain.  No neurological deficits and normal neuro exam.  Patient can walk but states is painful.  No loss of bowel or bladder control.  No concern for cauda equina.  No fever, night sweats, weight loss, h/o cancer, IVDU.  RICE protocol and pain medicine indicated and discussed with patient. Patient stable for discharge.  Return precautions given.     Hyman Bible, PA-C 07/06/14 [id number]  Ephraim Hamburger, MD 07/06/14 [phone]
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
## 2014-07-05 NOTE — Discharge Instructions (Signed)
Followup with orthopedics if symptoms continue. Use conservative methods at home including heat therapy and cold therapy as we discussed. More information on cold therapy is listed below.  It is not recommended to use heat treatment directly after an acute injury.  Take pain medication as needed.  Do not drive or operate heavy machinery for 4-6 hours after taking medication.  SEEK IMMEDIATE MEDICAL ATTENTION IF: New numbness, tingling, weakness, or problem with the use of your arms or legs.  Severe back pain not relieved with medications.  Change in bowel or bladder control.  Increasing pain in any areas of the body (such as chest or abdominal pain).  Shortness of breath, dizziness or fainting.  Nausea (feeling sick to your stomach), vomiting, fever, or sweats.  COLD THERAPY DIRECTIONS:  Ice or gel packs can be used to reduce both pain and swelling. Ice is the most helpful within the first 24 to 48 hours after an injury or flareup from overusing a muscle or joint.  Ice is effective, has very few side effects, and is safe for most people to use.   If you expose your skin to cold temperatures for too long or without the proper protection, you can damage your skin or nerves. Watch for signs of skin damage due to cold.   HOME CARE INSTRUCTIONS  Follow these tips to use ice and cold packs safely.  Place a dry or damp towel between the ice and skin. A damp towel will cool the skin more quickly, so you may need to shorten the time that the ice is used.  For a more rapid response, add gentle compression to the ice.  Ice for no more than 10 to 20 minutes at a time. The bonier the area you are icing, the less time it will take to get the benefits of ice.  Check your skin after 5 minutes to make sure there are no signs of a poor response to cold or skin damage.  Rest 20 minutes or more in between uses.  Once your skin is numb, you can end your treatment. You can test numbness by very lightly touching your  skin. The touch should be so light that you do not see the skin dimple from the pressure of your fingertip. When using ice, most people will feel these normal sensations in this order: cold, burning, aching, and numbness.  Do not use ice on someone who cannot communicate their responses to pain, such as small children or people with dementia.   HOW TO MAKE AN ICE PACK  To make an ice pack, do one of the following:  Place crushed ice or a bag of frozen vegetables in a sealable plastic bag. Squeeze out the excess air. Place this bag inside another plastic bag. Slide the bag into a pillowcase or place a damp towel between your skin and the bag.  Mix 3 parts water with 1 part rubbing alcohol. Freeze the mixture in a sealable plastic bag. When you remove the mixture from the freezer, it will be slushy. Squeeze out the excess air. Place this bag inside another plastic bag. Slide the bag into a pillowcase or place a damp towel between your skin and the bag.   SEEK MEDICAL CARE IF:  You develop white spots on your skin. This may give the skin a blotchy (mottled) appearance.  Your skin turns blue or pale.  Your skin becomes waxy or hard.  Your swelling gets worse.  MAKE SURE YOU:  Understand  these instructions.  Will watch your condition.  Will get help right away if you are not doing well or get worse.    Chronic Pain Discharge Instructions  Emergency care providers appreciate that many patients coming to [place] are in severe pain and we wish to address their pain in the safest, most responsible manner.  It is important to recognize however, that the proper treatment of chronic pain differs from that of the pain of injuries and acute illnesses.  Our goal is to provide quality, safe, personalized care and we thank you for giving [place] the opportunity to serve you. The use of narcotics and related agents for chronic pain syndromes may lead to additional physical and psychological problems.  Nearly as many people  die from prescription narcotics each year as die from car crashes.  Additionally, this risk is increased if such prescriptions are obtained from a variety of sources.  Therefore, only your primary care physician or a pain management specialist is able to safely treat such syndromes with narcotic medications long-term.    Documentation revealing such prescriptions have been sought from multiple sources may prohibit [place] from providing a refill or different narcotic medication.  Your name may be checked first through the [facility].  This database is a record of controlled substance medication prescriptions that the patient has received.  This has been established by [facility] in an effort to eliminate the dangerous, and often life threatening, practice of obtaining multiple prescriptions from different medical providers.   If you have a chronic pain syndrome (i.e. chronic headaches, recurrent back or neck pain, dental pain, abdominal or pelvis pain without a specific diagnosis, or neuropathic pain such as fibromyalgia) or recurrent visits for the same condition without an acute diagnosis, you may be treated with non-narcotics and other non-addictive medicines.  Allergic reactions or negative side effects that may be reported by a patient to such medications will not typically lead to the use of a narcotic analgesic or other controlled substance as an alternative.   Patients managing chronic pain with a personal physician should have provisions in place for breakthrough pain.  If you are in crisis, you should call your physician.  If your physician directs you to the emergency department, please have the doctor call and speak to our attending physician concerning your care.   When patients come to the Emergency Department (ED) with acute medical conditions in which the Emergency Department physician feels appropriate to prescribe narcotic or sedating pain  medication, the physician will prescribe these in very limited quantities.  The amount of these medications will last only until you can see your primary care physician in his/her office.  Any patient who returns to the ED seeking refills should expect only non-narcotic pain medications.   In the event of an acute medical condition exists and the emergency physician feels it is necessary that the patient be given a narcotic or sedating medication -  a responsible adult driver should be present in the room prior to the medication being given by the nurse.   Prescriptions for narcotic or sedating medications that have been lost, stolen or expired will not be refilled in the Emergency Department.    Patients who have chronic pain may receive non-narcotic prescriptions until seen by their primary care physician.  It is every patients personal responsibility to maintain active prescriptions with his or her primary care physician or specialist.

## 2014-07-05 NOTE — ED Notes (Signed)
Per EMS- picked up by [employer] d/t breaking warrant. History of chronic lower back pain. Was pulled to standing position-began c/o lower back pain radiating to bilateral lower pain. [employer] reports pain increased per patient when EMS drove up. Takes oxycodone for pain. Did drink alcohol today. Hx C-spine surgery and lower back surgery in 2015. Has been off HTN medication x2 days. VS: BP 208/90 HR 80 RR 18 CBG 79.

## 2014-08-09 ENCOUNTER — Encounter (HOSPITAL_COMMUNITY): Payer: Self-pay | Admitting: Emergency Medicine

## 2014-08-09 ENCOUNTER — Emergency Department (HOSPITAL_COMMUNITY)
Admission: EM | Admit: 2014-08-09 | Discharge: 2014-08-10 | Disposition: A | Discharge status: Home / Self Care | Payer: Non-veteran care | Attending: Emergency Medicine | Admitting: Emergency Medicine

## 2014-08-09 DIAGNOSIS — Z79899 Other long term (current) drug therapy: Secondary | ICD-10-CM | POA: Diagnosis not present

## 2014-08-09 DIAGNOSIS — W010XXA Fall on same level from slipping, tripping and stumbling without subsequent striking against object, initial encounter: Secondary | ICD-10-CM | POA: Insufficient documentation

## 2014-08-09 DIAGNOSIS — Z720 Tobacco use: Secondary | ICD-10-CM | POA: Diagnosis not present

## 2014-08-09 DIAGNOSIS — Y9289 Other specified places as the place of occurrence of the external cause: Secondary | ICD-10-CM | POA: Diagnosis not present

## 2014-08-09 DIAGNOSIS — W108XXA Fall (on) (from) other stairs and steps, initial encounter: Secondary | ICD-10-CM | POA: Diagnosis not present

## 2014-08-09 DIAGNOSIS — Z9889 Other specified postprocedural states: Secondary | ICD-10-CM | POA: Diagnosis not present

## 2014-08-09 DIAGNOSIS — M549 Dorsalgia, unspecified: Secondary | ICD-10-CM

## 2014-08-09 DIAGNOSIS — Y9389 Activity, other specified: Secondary | ICD-10-CM | POA: Diagnosis not present

## 2014-08-09 DIAGNOSIS — Y998 Other external cause status: Secondary | ICD-10-CM | POA: Insufficient documentation

## 2014-08-09 DIAGNOSIS — Z8719 Personal history of other diseases of the digestive system: Secondary | ICD-10-CM | POA: Diagnosis not present

## 2014-08-09 DIAGNOSIS — S3992XA Unspecified injury of lower back, initial encounter: Secondary | ICD-10-CM | POA: Diagnosis not present

## 2014-08-09 DIAGNOSIS — S199XXA Unspecified injury of neck, initial encounter: Secondary | ICD-10-CM | POA: Insufficient documentation

## 2014-08-09 DIAGNOSIS — I10 Essential (primary) hypertension: Secondary | ICD-10-CM | POA: Diagnosis not present

## 2014-08-09 DIAGNOSIS — Z791 Long term (current) use of non-steroidal anti-inflammatories (NSAID): Secondary | ICD-10-CM | POA: Insufficient documentation

## 2014-08-09 DIAGNOSIS — Z8709 Personal history of other diseases of the respiratory system: Secondary | ICD-10-CM | POA: Diagnosis not present

## 2014-08-09 DIAGNOSIS — W19XXXA Unspecified fall, initial encounter: Secondary | ICD-10-CM

## 2014-08-09 DIAGNOSIS — Z792 Long term (current) use of antibiotics: Secondary | ICD-10-CM | POA: Insufficient documentation

## 2014-08-09 NOTE — ED Notes (Signed)
Pt reports that he fell at 2130, slipped going down 2-3 stairs coming off of a porch. Pt reports that he hurt his back, previous surgery to upper and lower back. Pt placed brace on that he had from home after fall. Pt ambulatory with cane without difficulty at this time.

## 2014-08-10 ENCOUNTER — Emergency Department (HOSPITAL_COMMUNITY): Payer: Non-veteran care

## 2014-08-10 MED ORDER — HYDROMORPHONE HCL 1 MG/ML IJ SOLN
1.0000 mg | Freq: Once | INTRAMUSCULAR | Status: AC
  Administered 2014-08-10: 1 mg via INTRAMUSCULAR
  Filled 2014-08-10: qty 1

## 2014-08-10 MED ORDER — DIAZEPAM 5 MG PO TABS
5.0000 mg | ORAL_TABLET | Freq: Once | ORAL | Status: AC
  Administered 2014-08-10: 5 mg via ORAL
  Filled 2014-08-10: qty 1

## 2014-08-10 MED ORDER — METHOCARBAMOL 500 MG PO TABS: 500.0000 mg | ORAL_TABLET | Freq: Two times a day (BID) | ORAL | Status: DC

## 2014-08-10 MED ORDER — OXYCODONE HCL 5 MG PO TABS: 5.0000 mg | ORAL_TABLET | ORAL | Status: DC | PRN

## 2014-08-10 NOTE — ED Provider Notes (Signed)
CSN: [id number]     Arrival date & time 08/09/14  2346 History   First MD Initiated Contact with Patient 08/10/14 0015     Chief Complaint  Patient presents with  . Fall    (Consider location/radiation/quality/duration/timing/severity/associated sxs/prior Treatment) HPI Comments: Patient is a 58 year old male with a history of hypertension, GERD, and back pain s/p back surgery at the [facility] hospital. He presents to the emergency department today for worsening back pain secondary to a fall prior to arrival. Patient states that he slipped on some stairs at 2130 and came down on his low back. He states that he has had constant pain since this time that has not been relieved with NSAIDs and Robaxin. Patient states the pain will intermittently travel down his left leg. Pain is worse with ambulation. He has been able to ambulate with a cane; states he intermittently would use this cane prior to the fall. He is also complaining of some neck pain from the fall. This is on the left side of his neck and has been improving since onset. He denies any associated fever, LOC, bowel/bladder incontinence, extremity numbness. No hx of CA, no hx of IVDU.  The history is provided by the patient. No language interpreter was used.    Past Medical History  Diagnosis Date  . Hypertension   . GERD (gastroesophageal reflux disease)   . Bronchitis   . Back pain    Past Surgical History  Procedure Laterality Date  . Back surgery     History reviewed. No pertinent family history. History  Substance Use Topics  . Smoking status: Current Every Day Smoker -- 0.50 packs/day    Types: Cigarettes  . Smokeless tobacco: Never Used  . Alcohol Use: 0.6 oz/week    1 Cans of beer per week     Comment: 24 oz can of beer per day    Review of Systems  Constitutional: Negative for fever.  Genitourinary:       Negative for incontinence  Musculoskeletal: Positive for back pain and neck pain.  Neurological: Negative for  numbness.  All other systems reviewed and are negative.   Allergies  Review of patient's allergies indicates no known allergies.  Home Medications   Prior to Admission medications   Medication Sig Start Date End Date Taking? Authorizing Provider  amoxicillin-clavulanate (AUGMENTIN) 875-125 MG per tablet Take 1 tablet by mouth 2 (two) times daily. Patient not taking: Reported on 07/05/2014 09/01/13   Reyne Dumas, MD  cloNIDine (CATAPRES) 0.1 MG tablet Take 1 tablet (0.1 mg total) by mouth daily. 09/01/13   Reyne Dumas, MD  enalapril (VASOTEC) 20 MG tablet Take 1 tablet (20 mg total) by mouth 2 (two) times daily. 09/01/13   Reyne Dumas, MD  etodolac (LODINE XL) 600 MG 24 hr tablet Take 600 mg by mouth daily.    Historical Provider, MD  gabapentin (NEURONTIN) 300 MG capsule Take 300 mg by mouth 3 (three) times daily.    Historical Provider, MD  hydrochlorothiazide (HYDRODIURIL) 25 MG tablet Take 1 tablet (25 mg total) by mouth daily. 09/01/13   Reyne Dumas, MD  methocarbamol (ROBAXIN) 500 MG tablet Take 1 tablet (500 mg total) by mouth 2 (two) times daily. 08/10/14   Antonietta Breach, PA-C  ondansetron (ZOFRAN ODT) 8 MG disintegrating tablet Take 1 tablet (8 mg total) by mouth every 8 (eight) hours as needed for nausea. 05/20/13   Kalman Drape, MD  oxyCODONE (ROXICODONE) 5 MG immediate release tablet Take 1 tablet (  5 mg total) by mouth every 4 (four) hours as needed for severe pain. 08/10/14   Antonietta Breach, PA-C  traMADol (ULTRAM) 50 MG tablet Take 1 tablet (50 mg total) by mouth every 8 (eight) hours as needed for pain. Patient not taking: Reported on 07/05/2014 05/30/13   Tresa Garter, MD   BP 164/83 mmHg  Pulse 67  Temp(Src) 97.9 F (36.6 C) (Oral)  Resp 18  Ht 5\' 11"  (1.803 m)  Wt 190 lb (86.183 kg)  BMI 26.51 kg/m2  SpO2 100%   Physical Exam  Constitutional: He is oriented to person, place, and time. He appears well-developed and well-nourished. No distress.  Nontoxic/nonseptic  appearing  HENT:  Head: Normocephalic and atraumatic.  Eyes: Conjunctivae and EOM are normal. No scleral icterus.  Neck: Normal range of motion.  No cervical midline tenderness, bony deformities, step-off, or crepitus  Cardiovascular: Normal rate, regular rhythm and intact distal pulses.   DP and PT pulses 2+ b/l  Pulmonary/Chest: Effort normal. No respiratory distress.  Respirations even and unlabored  Musculoskeletal: Normal range of motion. He exhibits tenderness.       Thoracic back: Normal.       Lumbar back: He exhibits tenderness, bony tenderness and pain. He exhibits no swelling, no edema, no deformity, no laceration and no spasm.       Back:  Well healed midline incision to lumbar midline. TTP to lumbar midline at L3/4. No bony deformities, step offs, or crepitus.  Neurological: He is alert and oriented to person, place, and time. He exhibits normal muscle tone. Coordination normal.  Sensation to light touch intact in b/l lower extremities. Patient ambulatory with cane with steady gait. Patellar and Achilles reflexes 2+.  Skin: Skin is warm and dry. No rash noted. He is not diaphoretic. No erythema. No pallor.  Psychiatric: He has a normal mood and affect. His behavior is normal.  Nursing note and vitals reviewed.   ED Course  Procedures (including critical care time) Labs Review Labs Reviewed - No data to display  Imaging Review Dg Lumbar Spine Complete  08/10/2014   CLINICAL DATA:  Patient fell down steps 12/30 late pm, low back pain with left leg numbness since, patient had back surgery about one year ago.  EXAM: LUMBAR SPINE - COMPLETE 4+ VIEW  COMPARISON:  None.  FINDINGS: Sacroiliac joints are symmetric. The attempted lateral view is mildly oblique. Vertebral body height is maintained. No gross malalignment is identified. Loss of intervertebral disc height at L4-5 and L5-S1. There may be a transitional S1 vertebral body with a diminutive S1-2 disc. Advanced spondylosis,  with prominent endplate osteophytes. Facet arthropathy at L4 through S1.  IMPRESSION: Advanced spondylosis, without acute vertebral body height loss.  Mild degradation secondary to obliquity on the lateral view.   Electronically Signed   By: Abigail Miyamoto M.D.   On: 08/10/2014 01:20     EKG Interpretation None      MDM   Final diagnoses:  Fall  Back pain    58 year old male presents to the emergency department for further evaluation of back pain secondary to a fall at 2130. Patient also complaining of left-sided neck pain which has been improving since his fall. Patient is neurovascularly intact. No red flags or signs concerning for cauda equina. No tenderness to the cervical midline; patient clears Nexus criteria. He has tenderness to his lumbar midline without bony deformities, step-offs, or crepitus. Imaging today shows advanced spondylosis without vertebral body height loss. No evidence  of acute fracture or bony deformity.  Patient treated in ED with Dilaudid and Valium. Will manage symptoms as outpatient at this time with oxycodone and Robaxin. No indication for further emergent workup or imaging at this time. Strict return precautions discussed with the patient. These were also provided at discharge. Patient agreeable to plan with no unaddressed concerns. Patient discharged in good condition; VSS.   Filed Vitals:   08/09/14 2349 08/10/14 0146  BP: 177/81 164/83  Pulse: 86 67  Temp: 97.9 F (36.6 C)   TempSrc: Oral   Resp: 20 18  Height: 5\' 11"  (1.803 m)   Weight: 190 lb (86.183 kg)   SpO2: 98% 100%      Antonietta Breach, PA-C 08/10/14 2058  Carmin Muskrat, MD 08/11/14 0002

## 2014-08-10 NOTE — Discharge Instructions (Signed)
Recommend that you take Oxicodone and Robaxin for symptoms. Apply ice and heat to your back 3-4 times per day. Follow up with your neurosurgeon. Return to the ED if you lose sensation in your legs, are unable to walk, experience bowel/bladder incontinence, or develop fever.  Back Pain, Adult Low back pain is very common. About 1 in 5 people have back pain.The cause of low back pain is rarely dangerous. The pain often gets better over time.About half of people with a sudden onset of back pain feel better in just 2 weeks. About 8 in 10 people feel better by 6 weeks.  CAUSES Some common causes of back pain include:  Strain of the muscles or ligaments supporting the spine.  Wear and tear (degeneration) of the spinal discs.  Arthritis.  Direct injury to the back. DIAGNOSIS Most of the time, the direct cause of low back pain is not known.However, back pain can be treated effectively even when the exact cause of the pain is unknown.Answering your caregiver's questions about your overall health and symptoms is one of the most accurate ways to make sure the cause of your pain is not dangerous. If your caregiver needs more information, he or she may order lab work or imaging tests (X-rays or MRIs).However, even if imaging tests show changes in your back, this usually does not require surgery. HOME CARE INSTRUCTIONS For many people, back pain returns.Since low back pain is rarely dangerous, it is often a condition that people can learn to [facility] their own.   Remain active. It is stressful on the back to sit or stand in one place. Do not sit, drive, or stand in one place for more than 30 minutes at a time. Take short walks on level surfaces as soon as pain allows.Try to increase the length of time you walk each day.  Do not stay in bed.Resting more than 1 or 2 days can delay your recovery.  Do not avoid exercise or work.Your body is made to move.It is not dangerous to be active, even though  your back may hurt.Your back will likely heal faster if you return to being active before your pain is gone.  Pay attention to your body when you bend and lift. Many people have less discomfortwhen lifting if they bend their knees, keep the load close to their bodies,and avoid twisting. Often, the most comfortable positions are those that put less stress on your recovering back.  Find a comfortable position to sleep. Use a firm mattress and lie on your side with your knees slightly bent. If you lie on your back, put a pillow under your knees.  Only take over-the-counter or prescription medicines as directed by your caregiver. Over-the-counter medicines to reduce pain and inflammation are often the most helpful.Your caregiver may prescribe muscle relaxant drugs.These medicines help dull your pain so you can more quickly return to your normal activities and healthy exercise.  Put ice on the injured area.  Put ice in a plastic bag.  Place a towel between your skin and the bag.  Leave the ice on for 15-20 minutes, 03-04 times a day for the first 2 to 3 days. After that, ice and heat may be alternated to reduce pain and spasms.  Ask your caregiver about trying back exercises and gentle massage. This may be of some benefit.  Avoid feeling anxious or stressed.Stress increases muscle tension and can worsen back pain.It is important to recognize when you are anxious or stressed and learn ways  to manage it.Exercise is a great option. SEEK MEDICAL CARE IF:  You have pain that is not relieved with rest or medicine.  You have pain that does not improve in 1 week.  You have new symptoms.  You are generally not feeling well. SEEK IMMEDIATE MEDICAL CARE IF:   You have pain that radiates from your back into your legs.  You develop new bowel or bladder control problems.  You have unusual weakness or numbness in your arms or legs.  You develop nausea or vomiting.  You develop abdominal  pain.  You feel faint. Document Released: 07/28/2005 Document Revised: 01/27/2012 Document Reviewed: 11/29/2013 [facility] Patient Information 2015 [place], [place]. This information is not intended to replace advice given to you by your health care provider. Make sure you discuss any questions you have with your health care provider.  Back Injury Prevention Back injuries can be extremely painful and difficult to heal. After having one back injury, you are much more likely to experience another later on. It is important to learn how to avoid injuring or re-injuring your back. The following tips can help you to prevent a back injury. PHYSICAL FITNESS  Exercise regularly and try to develop good tone in your abdominal muscles. Your abdominal muscles provide a lot of the support needed by your back.  Do aerobic exercises (walking, jogging, biking, swimming) regularly.  Do exercises that increase balance and strength (tai chi, yoga) regularly. This can decrease your risk of falling and injuring your back.  Stretch before and after exercising.  Maintain a healthy weight. The more you weigh, the more stress is placed on your back. For every pound of weight, 10 times that amount of pressure is placed on the back. DIET  Talk to your caregiver about how much calcium and vitamin D you need per day. These nutrients help to prevent weakening of the bones (osteoporosis). Osteoporosis can cause broken (fractured) bones that lead to back pain.  Include good sources of calcium in your diet, such as dairy products, green, leafy vegetables, and products with calcium added (fortified).  Include good sources of vitamin D in your diet, such as milk and foods that are fortified with vitamin D.  Consider taking a nutritional supplement or a multivitamin if needed.  Stop smoking if you smoke. POSTURE  Sit and stand up straight. Avoid leaning forward when you sit or hunching over when you stand.  Choose chairs  with good low back (lumbar) support.  If you work at a desk, sit close to your work so you do not need to lean over. Keep your chin tucked in. Keep your neck drawn back and elbows bent at a right angle. Your arms should look like the letter "L."  Sit high and close to the steering wheel when you drive. Add a lumbar support to your car seat if needed.  Avoid sitting or standing in one position for too long. Take breaks to get up, stretch, and walk around at least once every hour. Take breaks if you are driving for long periods of time.  Sleep on your side with your knees slightly bent, or sleep on your back with a pillow under your knees. Do not sleep on your stomach. LIFTING, TWISTING, AND REACHING  Avoid heavy lifting, especially repetitive lifting. If you must do heavy lifting:  Stretch before lifting.  Work slowly.  Rest between lifts.  Use carts and dollies to move objects when possible.  Make several small trips instead of carrying  1 heavy load.  Ask for help when you need it.  Ask for help when moving big, awkward objects.  Follow these steps when lifting:  Stand with your feet shoulder-width apart.  Get as close to the object as you can. Do not try to pick up heavy objects that are far from your body.  Use handles or lifting straps if they are available.  Bend at your knees. Squat down, but keep your heels off the floor.  Keep your shoulders pulled back, your chin tucked in, and your back straight.  Lift the object slowly, tightening the muscles in your legs, abdomen, and buttocks. Keep the object as close to the center of your body as possible.  When you put a load down, use these same guidelines in reverse.  Do not:  Lift the object above your waist.  Twist at the waist while lifting or carrying a load. Move your feet if you need to turn, not your waist.  Bend over without bending at your knees.  Avoid reaching over your head, across a table, or for an  object on a high surface. OTHER TIPS  Avoid wet floors and keep sidewalks clear of ice to prevent falls.  Do not sleep on a mattress that is too soft or too hard.  Keep items that are used frequently within easy reach.  Put heavier objects on shelves at waist level and lighter objects on lower or higher shelves.  Find ways to decrease your stress, such as exercise, massage, or relaxation techniques. Stress can build up in your muscles. Tense muscles are more vulnerable to injury.  Seek treatment for depression or anxiety if needed. These conditions can increase your risk of developing back pain. SEEK MEDICAL CARE IF:  You injure your back.  You have questions about diet, exercise, or other ways to prevent back injuries. MAKE SURE YOU:  Understand these instructions.  Will watch your condition.  Will get help right away if you are not doing well or get worse. Document Released: 09/04/2004 Document Revised: 10/20/2011 Document Reviewed: 09/08/2011 [facility] Patient Information 2015 [place], [place]. This information is not intended to replace advice given to you by your health care provider. Make sure you discuss any questions you have with your health care provider.

## 2015-05-04 ENCOUNTER — Emergency Department (HOSPITAL_COMMUNITY)
Admission: EM | Admit: 2015-05-04 | Discharge: 2015-05-04 | Disposition: A | Discharge status: Home / Self Care | Payer: No Typology Code available for payment source | Attending: Emergency Medicine | Admitting: Emergency Medicine

## 2015-05-04 DIAGNOSIS — Y998 Other external cause status: Secondary | ICD-10-CM | POA: Insufficient documentation

## 2015-05-04 DIAGNOSIS — S3992XA Unspecified injury of lower back, initial encounter: Secondary | ICD-10-CM | POA: Diagnosis present

## 2015-05-04 DIAGNOSIS — Y9389 Activity, other specified: Secondary | ICD-10-CM | POA: Diagnosis not present

## 2015-05-04 DIAGNOSIS — Z8719 Personal history of other diseases of the digestive system: Secondary | ICD-10-CM | POA: Insufficient documentation

## 2015-05-04 DIAGNOSIS — Z9889 Other specified postprocedural states: Secondary | ICD-10-CM | POA: Diagnosis not present

## 2015-05-04 DIAGNOSIS — Y92410 Unspecified street and highway as the place of occurrence of the external cause: Secondary | ICD-10-CM | POA: Diagnosis not present

## 2015-05-04 DIAGNOSIS — I10 Essential (primary) hypertension: Secondary | ICD-10-CM | POA: Insufficient documentation

## 2015-05-04 DIAGNOSIS — M545 Low back pain, unspecified: Secondary | ICD-10-CM

## 2015-05-04 DIAGNOSIS — Z8709 Personal history of other diseases of the respiratory system: Secondary | ICD-10-CM | POA: Insufficient documentation

## 2015-05-04 MED ORDER — CYCLOBENZAPRINE HCL 10 MG PO TABS: 10.0000 mg | ORAL_TABLET | Freq: Three times a day (TID) | ORAL | Status: DC | PRN

## 2015-05-04 MED ORDER — IBUPROFEN 600 MG PO TABS: 600.0000 mg | ORAL_TABLET | Freq: Three times a day (TID) | ORAL | Status: DC | PRN
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
## 2015-05-04 NOTE — Discharge Instructions (Signed)
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
## 2015-12-09 ENCOUNTER — Emergency Department (HOSPITAL_COMMUNITY): Payer: Non-veteran care

## 2015-12-09 ENCOUNTER — Emergency Department (HOSPITAL_COMMUNITY)
Admission: EM | Admit: 2015-12-09 | Discharge: 2015-12-09 | Disposition: A | Discharge status: Home / Self Care | Payer: Non-veteran care | Attending: Emergency Medicine | Admitting: Emergency Medicine

## 2015-12-09 ENCOUNTER — Encounter (HOSPITAL_COMMUNITY): Payer: Self-pay | Admitting: *Deleted

## 2015-12-09 DIAGNOSIS — I10 Essential (primary) hypertension: Secondary | ICD-10-CM | POA: Insufficient documentation

## 2015-12-09 DIAGNOSIS — M545 Low back pain: Secondary | ICD-10-CM | POA: Diagnosis not present

## 2015-12-09 DIAGNOSIS — Z8719 Personal history of other diseases of the digestive system: Secondary | ICD-10-CM | POA: Insufficient documentation

## 2015-12-09 DIAGNOSIS — Z79899 Other long term (current) drug therapy: Secondary | ICD-10-CM | POA: Insufficient documentation

## 2015-12-09 DIAGNOSIS — Z8709 Personal history of other diseases of the respiratory system: Secondary | ICD-10-CM | POA: Diagnosis not present

## 2015-12-09 DIAGNOSIS — F17210 Nicotine dependence, cigarettes, uncomplicated: Secondary | ICD-10-CM | POA: Insufficient documentation

## 2015-12-09 DIAGNOSIS — M549 Dorsalgia, unspecified: Secondary | ICD-10-CM

## 2015-12-09 LAB — URINALYSIS, ROUTINE W REFLEX MICROSCOPIC
Bilirubin Urine: NEGATIVE
GLUCOSE, UA: NEGATIVE mg/dL
Hgb urine dipstick: NEGATIVE
KETONES UR: NEGATIVE mg/dL
LEUKOCYTES UA: NEGATIVE
NITRITE: NEGATIVE
PH: 5.5 (ref 5.0–8.0)
Protein, ur: >300 mg/dL — AB
Specific Gravity, Urine: 1.027 (ref 1.005–1.030)

## 2015-12-09 LAB — URINE MICROSCOPIC-ADD ON

## 2015-12-09 MED ORDER — HYDROCODONE-ACETAMINOPHEN 5-325 MG PO TABS: ORAL_TABLET | ORAL | Status: DC

## 2015-12-09 MED ORDER — HYDROCODONE-ACETAMINOPHEN 5-325 MG PO TABS
1.0000 | ORAL_TABLET | Freq: Once | ORAL | Status: AC
  Administered 2015-12-09: 1 via ORAL
  Filled 2015-12-09: qty 1

## 2015-12-09 MED ORDER — HYDROCODONE-ACETAMINOPHEN 5-325 MG PO TABS: 1.0000 | ORAL_TABLET | Freq: Once | ORAL | Status: DC

## 2015-12-09 NOTE — ED Notes (Addendum)
Pt complains of a flair up in back pain radiating down leg starting 2 days ago that is worse with movement. Pt states he has hx of back surgery in 2015 and has had intermittent back pain since.

## 2015-12-09 NOTE — ED Notes (Signed)
Patient was alert, oriented and stable upon discharge. RN went over AVS and patient had no further questions.  

## 2015-12-09 NOTE — ED Provider Notes (Signed)
CSN: [id number]     Arrival date & time 12/09/15  1214 History  By signing my name below, I, Julien Nordmann, attest that this documentation has been prepared under the direction and in the presence of [employer], PA-C. Electronically Signed: Julien Nordmann, ED Scribe. 12/09/2015. 1:00 PM.    Chief Complaint  Patient presents with  . Back Pain      The history is provided by the patient. No language interpreter was used.   HPI Comments: Ronald Reeves is a 60 y.o. male who has PMHx of back pain and HTN presents to the Emergency Department complaining of a constant, gradual worsening, moderate, left sided lower back pain that radiates to his LLQ and into his left leg onset one week ago. Pt notes he has a mild issue with urinary frequency and holding his urine. He states he had back surgery two years ago through the [place]. Pt ambulates with a cane and he expresses increased pain upon exacerbation. Pt takes gabapentin, methocarbamol and cyclobenzaprine to alleviate his pain with no relief. He denies bladder incontinence, burning with urination and urinary urgency. Pt is a smoker.   Past Medical History  Diagnosis Date  . Hypertension   . GERD (gastroesophageal reflux disease)   . Bronchitis   . Back pain    Past Surgical History  Procedure Laterality Date  . Back surgery     No family history on file. Social History  Substance Use Topics  . Smoking status: Current Every Day Smoker -- 0.50 packs/day    Types: Cigarettes  . Smokeless tobacco: Never Used  . Alcohol Use: 0.6 oz/week    1 Cans of beer per week     Comment: 24 oz can of beer per day    Review of Systems  A complete 10 system review of systems was obtained and all systems are negative except as noted in the HPI and PMH.    Allergies  Review of patient's allergies indicates no known allergies.  Home Medications   Prior to Admission medications   Medication Sig Start Date End Date Taking? Authorizing Provider   cloNIDine (CATAPRES) 0.1 MG tablet Take 1 tablet (0.1 mg total) by mouth daily. 09/01/13   Reyne Dumas, MD  cyclobenzaprine (FLEXERIL) 10 MG tablet Take 1 tablet (10 mg total) by mouth 3 (three) times daily as needed for muscle spasms. 05/04/15   Jola Schmidt, MD  enalapril (VASOTEC) 20 MG tablet Take 1 tablet (20 mg total) by mouth 2 (two) times daily. 09/01/13   Reyne Dumas, MD  gabapentin (NEURONTIN) 300 MG capsule Take 300 mg by mouth 3 (three) times daily.    Historical Provider, MD  hydrochlorothiazide (HYDRODIURIL) 25 MG tablet Take 1 tablet (25 mg total) by mouth daily. 09/01/13   Reyne Dumas, MD  ibuprofen (ADVIL,MOTRIN) 600 MG tablet Take 1 tablet (600 mg total) by mouth every 8 (eight) hours as needed. 05/04/15   Jola Schmidt, MD  ondansetron (ZOFRAN ODT) 8 MG disintegrating tablet Take 1 tablet (8 mg total) by mouth every 8 (eight) hours as needed for nausea. 05/20/13   Linton Flemings, MD   Triage vitals: BP 189/83 mmHg  Pulse 82  Temp(Src) 97.8 F (36.6 C) (Oral)  Resp 19  SpO2 96% Physical Exam  Constitutional: He is oriented to person, place, and time. He appears well-developed and well-nourished. No distress.  HENT:  Head: Normocephalic.  Mouth/Throat: Oropharynx is clear and moist.  Eyes: Conjunctivae and EOM are normal.  Neck: Normal range  of motion.  Cardiovascular: Normal rate, regular rhythm and intact distal pulses.   Pulmonary/Chest: Effort normal and breath sounds normal. No stridor. No respiratory distress. He has no wheezes. He has no rales. He exhibits no tenderness.  Abdominal: Soft. Bowel sounds are normal. He exhibits no distension and no mass. There is no tenderness. There is no rebound and no guarding.  Musculoskeletal: Normal range of motion. He exhibits tenderness.       Back:  Neurological: He is alert and oriented to person, place, and time.  No point tenderness to percussion of lumbar spinal processes.  No TTP or paraspinal muscular spasm. Strength is 5  out of 5 to bilateral lower extremities at hip and knee; extensor hallucis longus 5 out of 5. Ankle strength 5 out of 5, no clonus, neurovascularly intact. No saddle anaesthesia. Patellar reflexes are 2+ bilaterally.    In place with cane, straight leg raise is positive bilaterally at approximately 45.   Psychiatric: He has a normal mood and affect.  Nursing note and vitals reviewed.   ED Course  Procedures  DIAGNOSTIC STUDIES: Oxygen Saturation is 96% on RA, normal by my interpretation.  COORDINATION OF CARE:  12:53 PM Will order CT of back. Discussed treatment plan which includes urinalysis with pt at bedside and pt agreed to plan.  Labs Review Labs Reviewed - No data to display  Imaging Review No results found. I have personally reviewed and evaluated these images and lab results as part of my medical decision-making.   EKG Interpretation None      MDM   Final diagnoses:  Back pain    Filed Vitals:   12/09/15 1220  BP: 189/83  Pulse: 82  Temp: 97.8 F (36.6 C)  TempSrc: Oral  Resp: 19  SpO2: 96%    Medications  HYDROcodone-acetaminophen (NORCO/VICODIN) 5-325 MG per tablet 1 tablet (1 tablet Oral Given 12/09/15 1312)    Ronald Reeves is 60 y.o. male presenting with What initially described as exacerbation of his chronic low back pain, however, he also states is radiating around to the left groin area which isn't typical for him, abdominal exam is benign with no tenderness to palpation or pulsatile masses appreciated: Distally neurovascular intact with equal pulses to the bilateral lower extremities. Patient reports urinary frequency which is not normal for him. Will check CT renal stone and UA, patient given Vicodin for pain control.  CT without acute abnormality, UA without signs of infection or hematuria. Patient given prescription for Vicodin, advised him it is safe to combine it with his typical pain medications. Advised him to follow closely with primary care  physician. Patient verbalizes understanding, we have had an extended discussion about fall precautions.  Evaluation does not show pathology that would require ongoing emergent intervention or inpatient treatment. Pt is hemodynamically stable and mentating appropriately. Discussed findings and plan with patient/guardian, who agrees with care plan. All questions answered. Return precautions discussed and outpatient follow up given.   New Prescriptions   HYDROCODONE-ACETAMINOPHEN (NORCO/VICODIN) 5-325 MG TABLET    Take 1-2 tablets by mouth every 6 hours as needed for pain.     I personally performed the services described in this documentation, which was scribed in my presence. The recorded information has been reviewed and is accurate.   Monico Blitz, PA-C 12/09/15 [place], MD 12/09/15 [phone]

## 2015-12-09 NOTE — Discharge Instructions (Signed)
Take vicodin for breakthrough pain, do not drink alcohol, drive, care for children or do other critical tasks while taking vicodin.  Please be very careful not to fall! The pain medication and back pain puts you at risk for falls. Please rest as much as possible and try to not stay alone.   Please follow with your primary care doctor in the next 2 days for a check-up. They must obtain records for further management.   Do not hesitate to return to the Emergency Department for any new, worsening or concerning symptoms.    Back Pain, Adult Back pain is very common in adults.The cause of back pain is rarely dangerous and the pain often gets better over time.The cause of your back pain may not be known. Some common causes of back pain include:  Strain of the muscles or ligaments supporting the spine.  Wear and tear (degeneration) of the spinal disks.  Arthritis.  Direct injury to the back. For many people, back pain may return. Since back pain is rarely dangerous, most people can learn to manage this condition on their own. HOME CARE INSTRUCTIONS Watch your back pain for any changes. The following actions may help to lessen any discomfort you are feeling:  Remain active. It is stressful on your back to sit or stand in one place for long periods of time. Do not sit, drive, or stand in one place for more than 30 minutes at a time. Take short walks on even surfaces as soon as you are able.Try to increase the length of time you walk each day.  Exercise regularly as directed by your health care provider. Exercise helps your back heal faster. It also helps avoid future injury by keeping your muscles strong and flexible.  Do not stay in bed.Resting more than 1-2 days can delay your recovery.  Pay attention to your body when you bend and lift. The most comfortable positions are those that put less stress on your recovering back. Always use proper lifting techniques, including:  Bending your  knees.  Keeping the load close to your body.  Avoiding twisting.  Find a comfortable position to sleep. Use a firm mattress and lie on your side with your knees slightly bent. If you lie on your back, put a pillow under your knees.  Avoid feeling anxious or stressed.Stress increases muscle tension and can worsen back pain.It is important to recognize when you are anxious or stressed and learn ways to manage it, such as with exercise.  Take medicines only as directed by your health care provider. Over-the-counter medicines to reduce pain and inflammation are often the most helpful.Your health care provider may prescribe muscle relaxant drugs.These medicines help dull your pain so you can more quickly return to your normal activities and healthy exercise.  Apply ice to the injured area:  Put ice in a plastic bag.  Place a towel between your skin and the bag.  Leave the ice on for 20 minutes, 2-3 times a day for the first 2-3 days. After that, ice and heat may be alternated to reduce pain and spasms.  Maintain a healthy weight. Excess weight puts extra stress on your back and makes it difficult to maintain good posture. SEEK MEDICAL CARE IF:  You have pain that is not relieved with rest or medicine.  You have increasing pain going down into the legs or buttocks.  You have pain that does not improve in one week.  You have night pain.  You lose  weight.  You have a fever or chills. SEEK IMMEDIATE MEDICAL CARE IF:   You develop new bowel or bladder control problems.  You have unusual weakness or numbness in your arms or legs.  You develop nausea or vomiting.  You develop abdominal pain.  You feel faint.   This information is not intended to replace advice given to you by your health care provider. Make sure you discuss any questions you have with your health care provider.   Document Released: 07/28/2005 Document Revised: 08/18/2014 Document Reviewed: 11/29/2013 Elsevier  Interactive Patient Education [employer].

## 2017-04-30 ENCOUNTER — Encounter: Payer: Self-pay | Admitting: *Deleted

## 2017-04-30 DIAGNOSIS — Z23 Encounter for immunization: Secondary | ICD-10-CM

## 2017-08-05 DIAGNOSIS — M4802 Spinal stenosis, cervical region: Secondary | ICD-10-CM | POA: Insufficient documentation

## 2017-08-05 DIAGNOSIS — M47812 Spondylosis without myelopathy or radiculopathy, cervical region: Secondary | ICD-10-CM | POA: Insufficient documentation

## 2017-08-20 ENCOUNTER — Encounter (HOSPITAL_COMMUNITY): Payer: Self-pay | Admitting: Emergency Medicine

## 2017-08-20 ENCOUNTER — Other Ambulatory Visit: Payer: Self-pay

## 2017-08-20 ENCOUNTER — Inpatient Hospital Stay (HOSPITAL_COMMUNITY)
Admission: EM | Admit: 2017-08-20 | Discharge: 2017-08-22 | DRG: 313 | Disposition: A | Discharge status: Home / Self Care | Payer: Non-veteran care | Attending: Family Medicine | Admitting: Family Medicine

## 2017-08-20 ENCOUNTER — Emergency Department (HOSPITAL_COMMUNITY): Payer: Self-pay

## 2017-08-20 DIAGNOSIS — I10 Essential (primary) hypertension: Secondary | ICD-10-CM | POA: Diagnosis not present

## 2017-08-20 DIAGNOSIS — N189 Chronic kidney disease, unspecified: Secondary | ICD-10-CM | POA: Diagnosis not present

## 2017-08-20 DIAGNOSIS — R079 Chest pain, unspecified: Secondary | ICD-10-CM | POA: Diagnosis not present

## 2017-08-20 DIAGNOSIS — F1490 Cocaine use, unspecified, uncomplicated: Secondary | ICD-10-CM | POA: Diagnosis present

## 2017-08-20 DIAGNOSIS — M549 Dorsalgia, unspecified: Secondary | ICD-10-CM | POA: Diagnosis present

## 2017-08-20 DIAGNOSIS — G8929 Other chronic pain: Secondary | ICD-10-CM | POA: Diagnosis not present

## 2017-08-20 DIAGNOSIS — K219 Gastro-esophageal reflux disease without esophagitis: Secondary | ICD-10-CM | POA: Diagnosis present

## 2017-08-20 DIAGNOSIS — F1410 Cocaine abuse, uncomplicated: Secondary | ICD-10-CM | POA: Diagnosis present

## 2017-08-20 DIAGNOSIS — M545 Low back pain: Secondary | ICD-10-CM | POA: Diagnosis not present

## 2017-08-20 DIAGNOSIS — I129 Hypertensive chronic kidney disease with stage 1 through stage 4 chronic kidney disease, or unspecified chronic kidney disease: Secondary | ICD-10-CM | POA: Diagnosis present

## 2017-08-20 DIAGNOSIS — N183 Chronic kidney disease, stage 3 (moderate): Secondary | ICD-10-CM | POA: Diagnosis present

## 2017-08-20 DIAGNOSIS — Z7982 Long term (current) use of aspirin: Secondary | ICD-10-CM

## 2017-08-20 DIAGNOSIS — Z791 Long term (current) use of non-steroidal anti-inflammatories (NSAID): Secondary | ICD-10-CM

## 2017-08-20 DIAGNOSIS — F17210 Nicotine dependence, cigarettes, uncomplicated: Secondary | ICD-10-CM | POA: Diagnosis present

## 2017-08-20 DIAGNOSIS — Z79899 Other long term (current) drug therapy: Secondary | ICD-10-CM

## 2017-08-20 LAB — CBC
HEMATOCRIT: 42.9 % (ref 39.0–52.0)
HEMOGLOBIN: 14.5 g/dL (ref 13.0–17.0)
MCH: 28.4 pg (ref 26.0–34.0)
MCHC: 33.8 g/dL (ref 30.0–36.0)
MCV: 84 fL (ref 78.0–100.0)
Platelets: 201 10*3/uL (ref 150–400)
RBC: 5.11 MIL/uL (ref 4.22–5.81)
RDW: 14.5 % (ref 11.5–15.5)
WBC: 5.7 10*3/uL (ref 4.0–10.5)

## 2017-08-20 LAB — TROPONIN I
Troponin I: 0.06 ng/mL (ref <0.03)
Troponin I: 0.05 ng/mL (ref <0.03)
Troponin I: 0.05 ng/mL (ref <0.03)

## 2017-08-20 LAB — BASIC METABOLIC PANEL
ANION GAP: 4 — AB (ref 5–15)
BUN: 24 mg/dL — ABNORMAL HIGH (ref 6–20)
CO2: 24 mmol/L (ref 22–32)
Calcium: 9.1 mg/dL (ref 8.9–10.3)
Chloride: 109 mmol/L (ref 101–111)
Creatinine, Ser: 1.7 mg/dL — ABNORMAL HIGH (ref 0.61–1.24)
GFR calc Af Amer: 49 mL/min — ABNORMAL LOW (ref >60)
GFR calc non Af Amer: 42 mL/min — ABNORMAL LOW (ref >60)
GLUCOSE: 123 mg/dL — AB (ref 65–99)
POTASSIUM: 4.1 mmol/L (ref 3.5–5.1)
Sodium: 137 mmol/L (ref 135–145)

## 2017-08-20 LAB — I-STAT TROPONIN, ED: Troponin i, poc: 0.04 ng/mL (ref 0.00–0.08)

## 2017-08-20 MED ORDER — ASPIRIN 81 MG PO CHEW
324.0000 mg | CHEWABLE_TABLET | Freq: Once | ORAL | Status: AC
  Administered 2017-08-20: 324 mg via ORAL
  Filled 2017-08-20: qty 4

## 2017-08-20 MED ORDER — HYDROCHLOROTHIAZIDE 25 MG PO TABS
25.0000 mg | ORAL_TABLET | Freq: Every day | ORAL | Status: DC
  Administered 2017-08-20: 25 mg via ORAL
  Filled 2017-08-20: qty 1

## 2017-08-20 MED ORDER — AMLODIPINE BESYLATE 5 MG PO TABS
5.0000 mg | ORAL_TABLET | Freq: Every day | ORAL | Status: DC
  Administered 2017-08-20: 5 mg via ORAL
  Filled 2017-08-20: qty 1

## 2017-08-20 MED ORDER — ONDANSETRON HCL 4 MG/2ML IJ SOLN: 4.0000 mg | Freq: Four times a day (QID) | INTRAMUSCULAR | Status: DC | PRN

## 2017-08-20 MED ORDER — ENALAPRIL MALEATE 10 MG PO TABS
20.0000 mg | ORAL_TABLET | Freq: Two times a day (BID) | ORAL | Status: DC
  Administered 2017-08-20: 20 mg via ORAL
  Filled 2017-08-20 (×2): qty 2

## 2017-08-20 MED ORDER — ACETAMINOPHEN 325 MG PO TABS: 650.0000 mg | ORAL_TABLET | ORAL | Status: DC | PRN

## 2017-08-20 MED ORDER — ASPIRIN EC 81 MG PO TBEC
81.0000 mg | DELAYED_RELEASE_TABLET | Freq: Every day | ORAL | Status: DC
  Administered 2017-08-20 – 2017-08-22 (×3): 81 mg via ORAL
  Filled 2017-08-20 (×3): qty 1

## 2017-08-20 MED ORDER — SODIUM CHLORIDE 0.9 % IV SOLN
INTRAVENOUS | Status: DC
  Administered 2017-08-20: 16:00:00 via INTRAVENOUS

## 2017-08-20 MED ORDER — GABAPENTIN 300 MG PO CAPS
300.0000 mg | ORAL_CAPSULE | Freq: Three times a day (TID) | ORAL | Status: DC
  Administered 2017-08-20 – 2017-08-22 (×4): 300 mg via ORAL
  Filled 2017-08-20 (×4): qty 1

## 2017-08-20 MED ORDER — METHOCARBAMOL 500 MG PO TABS: 500.0000 mg | ORAL_TABLET | Freq: Three times a day (TID) | ORAL | Status: DC | PRN

## 2017-08-20 NOTE — H&P (Signed)
History and Physical    Ronald Reeves PNT:[id number] DOB: [birth year] DOA: 08/20/2017  PCP: Gerome Sam, MD  Patient coming from:  home  Chief Complaint:  Chest pressure and sob  HPI: [name] is a 62 y.o. male with medical history significant of HTN, GERD comes in with progressive worsening substernal chest pressure with associated sob since September 2018 that occurs and is worse with exertion , relieved with rest.  These episodes are happening more frequently and is to the point of happening almost daily when he walks up his stairs in his home.  The pressure lasts up to 15 minutes and is always relieved with rest.  No radiation of pain.  No fevers. No cough.  No le edema or pain.  Pt has seen his PCP at the [place] and has had a recent normal myoview stress test in October 2018, but his symptoms still have worsened.  Pt is chest pain free now, the last episode was this am.  He is being referred for admission for consideration of further definitive cardiac work up.  Review of Systems: As per HPI otherwise 10 point review of systems negative.   Past Medical History:  Diagnosis Date  . Back pain   . Bronchitis   . GERD (gastroesophageal reflux disease)   . Hypertension     Past Surgical History:  Procedure Laterality Date  . BACK SURGERY       reports that he has been smoking cigarettes.  He has been smoking about 0.50 packs per day. he has never used smokeless tobacco. He reports that he drinks about 0.6 oz of alcohol per week. He reports that he uses drugs. Drug: Cocaine.  No Known Allergies  No family history on file.  No premature CAD  Prior to Admission medications   Medication Sig Start Date End Date Taking? Authorizing Provider  amLODipine (NORVASC) 10 MG tablet Take 5 mg by mouth daily.   Yes [provider]  aspirin EC 81 MG tablet Take 81 mg by mouth daily.   Yes [provider]  enalapril (VASOTEC) 20 MG tablet Take 1 tablet (20 mg total) by mouth  2 (two) times daily. 09/01/13  Yes Reyne Dumas, MD  gabapentin (NEURONTIN) 300 MG capsule Take 300 mg by mouth 3 (three) times daily. For moods and anxiety-may also help with chronic pain   Yes [provider]  hydrochlorothiazide (HYDRODIURIL) 25 MG tablet Take 1 tablet (25 mg total) by mouth daily. 09/01/13  Yes Reyne Dumas, MD  meloxicam (MOBIC) 15 MG tablet Take 15 mg by mouth daily.   Yes [provider]  methocarbamol (ROBAXIN) 750 MG tablet Take 500 mg by mouth every 8 (eight) hours as needed for muscle spasms (neck and back pain).    Yes [provider]  cloNIDine (CATAPRES) 0.1 MG tablet Take 1 tablet (0.1 mg total) by mouth daily. Patient not taking: Reported on 08/20/2017 09/01/13   Reyne Dumas, MD  HYDROcodone-acetaminophen (NORCO/VICODIN) 5-325 MG tablet Take 1-2 tablets by mouth every 6 hours as needed for pain. Patient not taking: Reported on 08/20/2017 12/09/15   Waynetta Pean    Physical Exam: Vitals:   08/20/17 0930 08/20/17 1123 08/20/17 1319  BP: (!) 165/76 (!) 158/74 (!) 148/79  Pulse: 74 62 63  Resp: 19 19 16   Temp: 97.6 F (36.4 C) (!) 97.4 F (36.3 C)   TempSrc: Oral Oral   SpO2: 100% 99% 99%      Constitutional: NAD, calm, comfortable Vitals:  08/20/17 0930 08/20/17 1123 08/20/17 1319  BP: (!) 165/76 (!) 158/74 (!) 148/79  Pulse: 74 62 63  Resp: 19 19 16   Temp: 97.6 F (36.4 C) (!) 97.4 F (36.3 C)   TempSrc: Oral Oral   SpO2: 100% 99% 99%   Eyes: PERRL, lids and conjunctivae normal ENMT: Mucous membranes are moist. Posterior pharynx clear of any exudate or lesions.Normal dentition.  Neck: normal, supple, no masses, no thyromegaly Respiratory: clear to auscultation bilaterally, no wheezing, no crackles. Normal respiratory effort. No accessory muscle use.  Cardiovascular: Regular rate and rhythm, no murmurs / rubs / gallops. No extremity edema. 2+ pedal pulses. No carotid bruits.  Abdomen: no tenderness, no  masses palpated. No hepatosplenomegaly. Bowel sounds positive.  Musculoskeletal: no clubbing / cyanosis. No joint deformity upper and lower extremities. Good ROM, no contractures. Normal muscle tone.  Skin: no rashes, lesions, ulcers. No induration Neurologic: CN 2-12 grossly intact. Sensation intact, DTR normal. Strength 5/5 in all 4.  Psychiatric: Normal judgment and insight. Alert and oriented x 3. Normal mood.    Labs on Admission: I have personally reviewed following labs and imaging studies  CBC: Recent Labs  Lab 08/20/17 0935  WBC 5.7  HGB 14.5  HCT 42.9  MCV 84.0  PLT 814   Basic Metabolic Panel: Recent Labs  Lab 08/20/17 0935  NA 137  K 4.1  CL 109  CO2 24  GLUCOSE 123*  BUN 24*  CREATININE 1.70*  CALCIUM 9.1   GFR: CrCl cannot be calculated (Unknown ideal weight.). Liver Function Tests: No results for input(s): AST, ALT, ALKPHOS, BILITOT, PROT, ALBUMIN in the last 168 hours. No results for input(s): LIPASE, AMYLASE in the last 168 hours. No results for input(s): AMMONIA in the last 168 hours. Coagulation Profile: No results for input(s): INR, PROTIME in the last 168 hours. Cardiac Enzymes: No results for input(s): CKTOTAL, CKMB, CKMBINDEX, TROPONINI in the last 168 hours. BNP (last 3 results) No results for input(s): PROBNP in the last 8760 hours. HbA1C: No results for input(s): HGBA1C in the last 72 hours. CBG: No results for input(s): GLUCAP in the last 168 hours. Lipid Profile: No results for input(s): CHOL, HDL, LDLCALC, TRIG, CHOLHDL, LDLDIRECT in the last 72 hours. Thyroid Function Tests: No results for input(s): TSH, T4TOTAL, FREET4, T3FREE, THYROIDAB in the last 72 hours. Anemia Panel: No results for input(s): VITAMINB12, FOLATE, FERRITIN, TIBC, IRON, RETICCTPCT in the last 72 hours. Urine analysis:    Component Value Date/Time   COLORURINE YELLOW 12/09/2015 [place] 12/09/2015 1358   LABSPEC 1.027 12/09/2015 1358    PHURINE 5.5 12/09/2015 1358   GLUCOSEU NEGATIVE 12/09/2015 1358   HGBUR NEGATIVE 12/09/2015 1358   BILIRUBINUR NEGATIVE 12/09/2015 1358   KETONESUR NEGATIVE 12/09/2015 1358   PROTEINUR >300 (A) 12/09/2015 1358   UROBILINOGEN 1.0 05/20/2013 0532   NITRITE NEGATIVE 12/09/2015 1358   LEUKOCYTESUR NEGATIVE 12/09/2015 1358   Sepsis Labs: !!!!!!!!!!!!!!!!!!!!!!!!!!!!!!!!!!!!!!!!!!!! @LABRCNTIP (procalcitonin:4,lacticidven:4) )No results found for this or any previous visit (from the past 240 hour(s)).   Radiological Exams on Admission: Dg Chest 2 View  Result Date: 08/20/2017 CLINICAL DATA:  62 year old male with new onset of mid left-sided chest pain and dizziness since 7:30 a.m. EXAM: CHEST  2 VIEW COMPARISON:  Chest x-ray 05/20/2013. FINDINGS: Lung volumes are normal. No consolidative airspace disease. No pleural effusions. No pneumothorax. No pulmonary nodule or mass noted. Pulmonary vasculature and the cardiomediastinal silhouette are within normal limits. Atherosclerosis in the thoracic aorta. IMPRESSION: 1.  No radiographic evidence of acute cardiopulmonary disease. 2. Aortic atherosclerosis. Electronically Signed   By: Vinnie Langton M.D.   On: 08/20/2017 09:54    EKG: Independently reviewed. nsr twi inflat leads Old records reviewed from [place] recent myoview done in oct was neg Case discussed with edp Case discussed with dr [name] with cardiology for nonemergent consultation  Assessment/Plan 62 yo male with HTN comes in with typical anginal symptoms with recent normal cardiac stress testing  Principal Problem:   Chest pain- aspirin.  Currently chest pain free.  ekg with tw inversions, initial trop in normal.  Have called dr [name] with cardiology who will do cath in the am.  Keep npo after midnight.  Ck flp in the am.  If his pain worsens while here or trop elevates start ntg and heparin.  Active Problems:   Chronic kidney disease-  Unknown baseline.  Cr 1.7.  ivf overnight  before cath tom   Essential hypertension, malignant- stable, cont home meds   Chronic back pain- cont home meds    DVT prophylaxis:  scds Code Status:  full Family Communication:  none  Disposition Plan:  Per day team Consults called:  cardiology Admission status:  observation   Elease Swarm A MD [facility]  If 7PM-7AM, please contact night-coverage [email or web address] Password [email or web address]  08/20/2017, 1:41 PM

## 2017-08-20 NOTE — ED Provider Notes (Signed)
[facility] Provider Note   CSN: [id number] Arrival date & time: 08/20/17  0915     History   Chief Complaint Chief Complaint  Patient presents with  . Chest Pain  . Shortness of Breath    HPI Ronald Reeves is a 62 y.o. male.  HPI   62yo male with history of hypertension, hyperlipidemia, smoking presents with chest pain.  Reports he has exertional chest pressure that has been going on since October. He had stress test done at the [place], was told by his PCP that he had "plaques" in his heart and she recommended medications for it. Has not seen Cardiology.  Reports that chest pressure has been becoming more severe and frequent. Today he was getting ready for work around 730AM and was walking up the stairs when he developed severe, pressing pain, lightheadedness, near-syncope and dyspnea.  Reports it lasted about 20 minutes prior to improving. Is CP free now.  Reports he has had CP, lightheadedness, diaphoresis with exertion over last few mos and it is worsening.   Past Medical History:  Diagnosis Date  . Back pain   . Bronchitis   . GERD (gastroesophageal reflux disease)   . Hypertension     Patient Active Problem List   Diagnosis Date Noted  . Chest pain 08/20/2017  . Chronic back pain 08/20/2017  . Peripheral neuropathy 05/30/2013  . Chronic kidney disease 05/30/2013  . Essential hypertension, malignant 05/30/2013  . Cervical radiculopathy 05/30/2013  . Acute exacerbation of chronic low back pain 05/30/2013  . Dental caries 05/30/2013    Past Surgical History:  Procedure Laterality Date  . BACK SURGERY         Home Medications    Prior to Admission medications   Medication Sig Start Date End Date Taking? Authorizing Provider  amLODipine (NORVASC) 10 MG tablet Take 5 mg by mouth daily.   Yes [provider]  aspirin EC 81 MG tablet Take 81 mg by mouth daily.   Yes [provider]  enalapril (VASOTEC) 20 MG  tablet Take 1 tablet (20 mg total) by mouth 2 (two) times daily. 09/01/13  Yes Reyne Dumas, MD  gabapentin (NEURONTIN) 300 MG capsule Take 300 mg by mouth 3 (three) times daily. For moods and anxiety-may also help with chronic pain   Yes [provider]  hydrochlorothiazide (HYDRODIURIL) 25 MG tablet Take 1 tablet (25 mg total) by mouth daily. 09/01/13  Yes Reyne Dumas, MD  meloxicam (MOBIC) 15 MG tablet Take 15 mg by mouth daily.   Yes [provider]  methocarbamol (ROBAXIN) 750 MG tablet Take 500 mg by mouth every 8 (eight) hours as needed for muscle spasms (neck and back pain).    Yes [provider]  cloNIDine (CATAPRES) 0.1 MG tablet Take 1 tablet (0.1 mg total) by mouth daily. Patient not taking: Reported on 08/20/2017 09/01/13   Reyne Dumas, MD  HYDROcodone-acetaminophen (NORCO/VICODIN) 5-325 MG tablet Take 1-2 tablets by mouth every 6 hours as needed for pain. Patient not taking: Reported on 08/20/2017 12/09/15   Pisciotta, Charna Elizabeth    Family History No family history on file.  Social History Social History   Tobacco Use  . Smoking status: Current Every Day Smoker    Packs/day: 0.50    Types: Cigarettes  . Smokeless tobacco: Never Used  Substance Use Topics  . Alcohol use: Yes    Alcohol/week: 0.6 oz    Types: 1 Cans of beer per  week    Comment: 24 oz can of beer per day  . Drug use: Yes    Types: Cocaine    Comment: pt states he uses crack cocaine at least 2xweek.      Allergies   Patient has no known allergies.   Review of Systems Review of Systems  Constitutional: Positive for diaphoresis and fatigue. Negative for fever.  HENT: Negative for sore throat.   Eyes: Negative for visual disturbance.  Respiratory: Positive for shortness of breath.   Cardiovascular: Positive for chest pain.  Gastrointestinal: Negative for abdominal pain, nausea and vomiting.  Genitourinary: Negative for difficulty urinating.  Musculoskeletal: Negative  for back pain and neck stiffness.  Skin: Negative for rash.  Neurological: Positive for light-headedness. Negative for syncope and headaches.     Physical Exam Updated Vital Signs BP (!) 159/77   Pulse 68   Temp 98.3 F (36.8 C)   Resp 18   Ht 5\' 10"  (1.778 m)   Wt 86.2 kg (190 lb)   SpO2 100%   BMI 27.26 kg/m   Physical Exam  Constitutional: He is oriented to person, place, and time. He appears well-developed and well-nourished. No distress.  HENT:  Head: Normocephalic and atraumatic.  Eyes: Conjunctivae and EOM are normal.  Neck: Normal range of motion.  Cardiovascular: Normal rate, regular rhythm, normal heart sounds and intact distal pulses. Exam reveals no gallop and no friction rub.  No murmur heard. Pulmonary/Chest: Effort normal and breath sounds normal. No respiratory distress. He has no wheezes. He has no rales.  Abdominal: Soft. He exhibits no distension. There is no tenderness. There is no guarding.  Musculoskeletal: He exhibits no edema.  Neurological: He is alert and oriented to person, place, and time.  Skin: Skin is warm and dry. He is not diaphoretic.  Nursing note and vitals reviewed.    ED Treatments / Results  Labs (all labs ordered are listed, but only abnormal results are displayed) Labs Reviewed  BASIC METABOLIC PANEL - Abnormal; Notable for the following components:      Result Value   Glucose, Bld 123 (*)    BUN 24 (*)    Creatinine, Ser 1.70 (*)    GFR calc non Af Amer 42 (*)    GFR calc Af Amer 49 (*)    Anion gap 4 (*)    All other components within normal limits  TROPONIN I - Abnormal; Notable for the following components:   Troponin I 0.06 (*)    All other components within normal limits  TROPONIN I - Abnormal; Notable for the following components:   Troponin I 0.05 (*)    All other components within normal limits  CBC  HIV ANTIBODY (ROUTINE TESTING)  TROPONIN I  LIPID PANEL  I-STAT TROPONIN, ED    EKG  EKG  Interpretation  Date/Time:  Thursday August 20 2017 09:29:43 EST Ventricular Rate:  77 PR Interval:    QRS Duration: 93 QT Interval:  388 QTC Calculation: 440 R Axis:   46 Text Interpretation:  Sinus rhythm LVH with secondary repolarization abnormality Probable anterior infarct, age indeterminate No previous ECGs in our system, similar to [place] ECG seen at bedside Confirmed by Gareth Morgan [phone]) on 08/20/2017 10:12:21 AM       Radiology Dg Chest 2 View  Result Date: 08/20/2017 CLINICAL DATA:  62 year old male with new onset of mid left-sided chest pain and dizziness since 7:30 a.m. EXAM: CHEST  2 VIEW COMPARISON:  Chest x-ray 05/20/2013. FINDINGS:  Lung volumes are normal. No consolidative airspace disease. No pleural effusions. No pneumothorax. No pulmonary nodule or mass noted. Pulmonary vasculature and the cardiomediastinal silhouette are within normal limits. Atherosclerosis in the thoracic aorta. IMPRESSION: 1.  No radiographic evidence of acute cardiopulmonary disease. 2. Aortic atherosclerosis. Electronically Signed   By: Vinnie Langton M.D.   On: 08/20/2017 09:54    Procedures Procedures (including critical care time)  Medications Ordered in ED Medications  amLODipine (NORVASC) tablet 5 mg (5 mg Oral Given 08/20/17 1730)  aspirin EC tablet 81 mg (81 mg Oral Given 08/20/17 1730)  enalapril (VASOTEC) tablet 20 mg (not administered)  gabapentin (NEURONTIN) capsule 300 mg (not administered)  hydrochlorothiazide (HYDRODIURIL) tablet 25 mg (25 mg Oral Given 08/20/17 1730)  methocarbamol (ROBAXIN) tablet 500 mg (not administered)  acetaminophen (TYLENOL) tablet 650 mg (not administered)  ondansetron (ZOFRAN) injection 4 mg (not administered)  0.9 %  sodium chloride infusion ( Intravenous New Bag/Given 08/20/17 1623)  aspirin chewable tablet 324 mg (324 mg Oral Given 08/20/17 1150)     Initial Impression / Assessment and Plan / ED Course  I have reviewed the triage vital signs  and the nursing notes.  Pertinent labs & imaging results that were available during my care of the patient were reviewed by me and considered in my medical decision making (see chart for details).     62yo male with history of hypertension, hyperlipidemia, smoking presents with chest pain. EKG shows LVH. Troponin negative. History, physical, not consistent with PE or dissection.  History concerning for escalating anginal symptoms. He has had stress test, however catheterization is likely next evaluation given worsening exertional symptoms.  Given aspirin. Chest pain free at this time. He is high risk HEART score. Will admit for further chest pain evaluation.   Final Clinical Impressions(s) / ED Diagnoses   Final diagnoses:  Chest pain with high risk for cardiac etiology    ED Discharge Orders    None       Gareth Morgan, MD 08/20/17 2208

## 2017-08-20 NOTE — ED Triage Notes (Signed)
Pt complaint of left chest pain and SOB with exertion onset 0730. Called primary care related to hx of "heart problems.

## 2017-08-20 NOTE — ED Notes (Signed)
ED TO INPATIENT HANDOFF REPORT  Name/Age/Gender Ronald Reeves 62 y.o. male  Code Status Code Status History    This patient does not have a recorded code status. Please follow your organizational policy for patients in this situation.    Advance Directive Documentation     Most Recent Value  Type of Advance Directive  Healthcare Power of Attorney  Pre-existing out of facility DNR order (yellow form or pink MOST form)  No data  "MOST" Form in Place?  No data      Home/SNF/Other Home  Chief Complaint chest pain, sob  Level of Care/Admitting Diagnosis ED Disposition    ED Disposition Condition Comment   Admit  Hospital Area: [facility] [[id number]]  Level of Care: Telemetry [5]  Admit to tele based on following criteria: Monitor for Ischemic changes  Diagnosis: Chest pain [093235]  Admitting Physician: Phillips Grout [[id number]]  Attending Physician: Derrill Kay A [[id number]]  PT Class (Do Not Modify): Observation [104]  PT Acc Code (Do Not Modify): Observation [10022]       Medical History Past Medical History:  Diagnosis Date  . Back pain   . Bronchitis   . GERD (gastroesophageal reflux disease)   . Hypertension     Allergies No Known Allergies  IV Location/Drains/Wounds Patient Lines/Drains/Airways Status   Active Line/Drains/Airways    Name:   Placement date:   Placement time:   Site:   Days:   Peripheral IV 08/20/17 Right Hand   08/20/17    1512    Hand   less than 1          Labs/Imaging Results for orders placed or performed during the hospital encounter of 08/20/17 (from the past 48 hour(s))  Basic metabolic panel     Status: Abnormal   Collection Time: 08/20/17  9:35 AM  Result Value Ref Range   Sodium 137 135 - 145 mmol/L   Potassium 4.1 3.5 - 5.1 mmol/L   Chloride 109 101 - 111 mmol/L   CO2 24 22 - 32 mmol/L   Glucose, Bld 123 (H) 65 - 99 mg/dL   BUN 24 (H) 6 - 20 mg/dL   Creatinine, Ser 1.70 (H) 0.61 - 1.24 mg/dL   Calcium 9.1  8.9 - 10.3 mg/dL   GFR calc non Af Amer 42 (L) >60 mL/min   GFR calc Af Amer 49 (L) >60 mL/min    Comment: (NOTE) The eGFR has been calculated using the CKD EPI equation. This calculation has not been validated in all clinical situations. eGFR's persistently <60 mL/min signify possible Chronic Kidney Disease.    Anion gap 4 (L) 5 - 15  CBC     Status: None   Collection Time: 08/20/17  9:35 AM  Result Value Ref Range   WBC 5.7 4.0 - 10.5 K/uL   RBC 5.11 4.22 - 5.81 MIL/uL   Hemoglobin 14.5 13.0 - 17.0 g/dL   HCT 42.9 39.0 - 52.0 %   MCV 84.0 78.0 - 100.0 fL   MCH 28.4 26.0 - 34.0 pg   MCHC 33.8 30.0 - 36.0 g/dL   RDW 14.5 11.5 - 15.5 %   Platelets 201 150 - 400 K/uL  I-stat troponin, ED     Status: None   Collection Time: 08/20/17  9:46 AM  Result Value Ref Range   Troponin i, poc 0.04 0.00 - 0.08 ng/mL   Comment 3            Comment: Due to  the release kinetics of cTnI, a negative result within the first hours of the onset of symptoms does not rule out myocardial infarction with certainty. If myocardial infarction is still suspected, repeat the test at appropriate intervals.    Dg Chest 2 View  Result Date: 08/20/2017 CLINICAL DATA:  63 year old male with new onset of mid left-sided chest pain and dizziness since 7:30 a.m. EXAM: CHEST  2 VIEW COMPARISON:  Chest x-ray 05/20/2013. FINDINGS: Lung volumes are normal. No consolidative airspace disease. No pleural effusions. No pneumothorax. No pulmonary nodule or mass noted. Pulmonary vasculature and the cardiomediastinal silhouette are within normal limits. Atherosclerosis in the thoracic aorta. IMPRESSION: 1.  No radiographic evidence of acute cardiopulmonary disease. 2. Aortic atherosclerosis. Electronically Signed   By: Vinnie Langton M.D.   On: 08/20/2017 09:54    Pending Labs [employer] (From admission, onward)   Start     Ordered   Signed and Held  HIV antibody (Routine Testing)  Once,   R     Signed and Held    Signed and Held  Troponin I-serum (0, 3, 6 hours)  Now then every 3 hours,   TIMED     Signed and Held   Signed and Held  Lipid panel  Tomorrow morning,   R     Signed and Held      Vitals/Pain Today's Vitals   08/20/17 1319 08/20/17 1345 08/20/17 1430 08/20/17 1515  BP: (!) 148/79 (!) 167/81 (!) 160/79 (!) 172/76  Pulse: 63 64 (!) 56 (!) 59  Resp: '16 19 15 13  '$ Temp:      TempSrc:      SpO2: 99% 100% 99% 100%  PainSc:        Isolation Precautions No active isolations  Medications Medications  0.9 %  sodium chloride infusion (not administered)  aspirin chewable tablet 324 mg (324 mg Oral Given 08/20/17 1150)    Mobility walks with device

## 2017-08-20 NOTE — ED Notes (Signed)
RN called [facility] to ask them to please send the results of the echocardigram and the stress test.

## 2017-08-20 NOTE — Progress Notes (Signed)
Critical lab value: troponin = 0.06 Notified: MD Steward Ros at [phone] Action taken: acknowledged

## 2017-08-21 ENCOUNTER — Encounter (HOSPITAL_COMMUNITY): Payer: Self-pay | Admitting: Cardiology

## 2017-08-21 ENCOUNTER — Observation Stay (HOSPITAL_BASED_OUTPATIENT_CLINIC_OR_DEPARTMENT_OTHER)
Admit: 2017-08-21 | Discharge: 2017-08-21 | Disposition: A | Discharge status: Home / Self Care | Payer: Self-pay | Attending: Cardiology | Admitting: Cardiology

## 2017-08-21 ENCOUNTER — Ambulatory Visit (HOSPITAL_COMMUNITY)
Admit: 2017-08-21 | Discharge: 2017-08-21 | Disposition: A | Discharge status: Home / Self Care | Payer: Self-pay | Attending: Cardiology | Admitting: Cardiology

## 2017-08-21 DIAGNOSIS — R079 Chest pain, unspecified: Principal | ICD-10-CM

## 2017-08-21 DIAGNOSIS — R072 Precordial pain: Secondary | ICD-10-CM

## 2017-08-21 DIAGNOSIS — I10 Essential (primary) hypertension: Secondary | ICD-10-CM

## 2017-08-21 DIAGNOSIS — I351 Nonrheumatic aortic (valve) insufficiency: Secondary | ICD-10-CM

## 2017-08-21 DIAGNOSIS — M545 Low back pain: Secondary | ICD-10-CM

## 2017-08-21 DIAGNOSIS — N183 Chronic kidney disease, stage 3 (moderate): Secondary | ICD-10-CM

## 2017-08-21 DIAGNOSIS — G8929 Other chronic pain: Secondary | ICD-10-CM

## 2017-08-21 LAB — COMPREHENSIVE METABOLIC PANEL
ALK PHOS: 115 U/L (ref 38–126)
ALT: 18 U/L (ref 17–63)
ANION GAP: 5 (ref 5–15)
AST: 24 U/L (ref 15–41)
Albumin: 3.2 g/dL — ABNORMAL LOW (ref 3.5–5.0)
BUN: 22 mg/dL — ABNORMAL HIGH (ref 6–20)
CALCIUM: 8.4 mg/dL — AB (ref 8.9–10.3)
CO2: 22 mmol/L (ref 22–32)
Chloride: 108 mmol/L (ref 101–111)
Creatinine, Ser: 1.73 mg/dL — ABNORMAL HIGH (ref 0.61–1.24)
GFR, EST AFRICAN AMERICAN: 48 mL/min — AB (ref >60)
GFR, EST NON AFRICAN AMERICAN: 41 mL/min — AB (ref >60)
Glucose, Bld: 99 mg/dL (ref 65–99)
Potassium: 4.2 mmol/L (ref 3.5–5.1)
Sodium: 135 mmol/L (ref 135–145)
TOTAL PROTEIN: 6.5 g/dL (ref 6.5–8.1)
Total Bilirubin: 0.5 mg/dL (ref 0.3–1.2)

## 2017-08-21 LAB — NM MYOCAR MULTI W/SPECT W/WALL MOTION / EF
CHL CUP MPHR: 161 {beats}/min
CHL CUP RESTING HR STRESS: 50 {beats}/min
Estimated workload: 1 METS
Exercise duration (min): 5 min
Peak HR: 77 {beats}/min
Percent HR: 47 %

## 2017-08-21 LAB — ECHOCARDIOGRAM COMPLETE
HEIGHTINCHES: 70 in
WEIGHTICAEL: 3040 [oz_av]

## 2017-08-21 LAB — LIPID PANEL
CHOL/HDL RATIO: 2.9 ratio
Cholesterol: 148 mg/dL (ref 0–200)
HDL: 51 mg/dL (ref >40)
LDL CALC: 86 mg/dL (ref 0–99)
Triglycerides: 55 mg/dL (ref <150)
VLDL: 11 mg/dL (ref 0–40)

## 2017-08-21 LAB — TSH: TSH: 1.02 u[IU]/mL (ref 0.350–4.500)

## 2017-08-21 LAB — HIV ANTIBODY (ROUTINE TESTING W REFLEX): HIV SCREEN 4TH GENERATION: NONREACTIVE

## 2017-08-21 LAB — MAGNESIUM: MAGNESIUM: 2 mg/dL (ref 1.7–2.4)

## 2017-08-21 MED ORDER — ENOXAPARIN SODIUM 40 MG/0.4ML ~~LOC~~ SOLN
40.0000 mg | Freq: Every day | SUBCUTANEOUS | Status: DC
  Administered 2017-08-21: 40 mg via SUBCUTANEOUS
  Filled 2017-08-21 (×2): qty 0.4

## 2017-08-21 MED ORDER — ATORVASTATIN CALCIUM 40 MG PO TABS
40.0000 mg | ORAL_TABLET | Freq: Every day | ORAL | Status: DC
  Administered 2017-08-21: 40 mg via ORAL
  Filled 2017-08-21: qty 1

## 2017-08-21 MED ORDER — TECHNETIUM TC 99M TETROFOSMIN IV KIT
10.0000 | PACK | Freq: Once | INTRAVENOUS | Status: AC | PRN
  Administered 2017-08-21: 10 via INTRAVENOUS

## 2017-08-21 MED ORDER — ISOSORBIDE MONONITRATE ER 60 MG PO TB24
60.0000 mg | ORAL_TABLET | Freq: Every day | ORAL | Status: DC
  Administered 2017-08-21 – 2017-08-22 (×2): 60 mg via ORAL
  Filled 2017-08-21 (×2): qty 1

## 2017-08-21 MED ORDER — AMLODIPINE BESYLATE 10 MG PO TABS
10.0000 mg | ORAL_TABLET | Freq: Every day | ORAL | Status: DC
  Administered 2017-08-22: 10 mg via ORAL
  Filled 2017-08-21 (×2): qty 1

## 2017-08-21 MED ORDER — REGADENOSON 0.4 MG/5ML IV SOLN
INTRAVENOUS | Status: AC
  Filled 2017-08-21: qty 5

## 2017-08-21 MED ORDER — HYDRALAZINE HCL 20 MG/ML IJ SOLN
10.0000 mg | INTRAMUSCULAR | Status: DC | PRN
  Administered 2017-08-21: 10 mg via INTRAVENOUS
  Filled 2017-08-21: qty 1

## 2017-08-21 MED ORDER — ISOSORBIDE MONONITRATE ER 30 MG PO TB24: 30.0000 mg | ORAL_TABLET | Freq: Every day | ORAL | Status: DC

## 2017-08-21 MED ORDER — REGADENOSON 0.4 MG/5ML IV SOLN
0.4000 mg | Freq: Once | INTRAVENOUS | Status: AC
  Administered 2017-08-21: 0.4 mg via INTRAVENOUS

## 2017-08-21 MED ORDER — TECHNETIUM TC 99M TETROFOSMIN IV KIT
30.0000 | PACK | Freq: Once | INTRAVENOUS | Status: AC | PRN
  Administered 2017-08-21: 30 via INTRAVENOUS

## 2017-08-21 NOTE — Progress Notes (Signed)
  Echocardiogram 2D Echocardiogram has been performed.  Merrie Roof F 08/21/2017, 1:31 PM

## 2017-08-21 NOTE — Consult Note (Signed)
Cardiology Consultation:   Patient ID: Ronald Reeves; [id number]; [birth year]   Admit date: 08/20/2017 Date of Consult: 08/21/2017  Primary Care Provider: Gerome Sam, MD Primary Cardiologist: No primary care provider on file. new Dr. [name] Primary Electrophysiologist:  NA    Patient Profile:   Ronald Reeves is a 62 y.o. male with a hx of HTN, GERD, and back pain who is being seen today for the evaluation of chest pain and SOB  at the request of Dr. Bonner Puna.  History of Present Illness:   Ronald Reeves has a hx of HTN, GERD, and back pain now presents with chest pain 08/20/17.  He began having chest pressure with SOB 04/2017 increases with exertion and relieved with rest.  This has progressed to almost daily occurrence with walking up stairs in his home. Pt tells me he had Stress test and echo due to abnormal EKG and pre-op clearance for his back surgery though he is difficult historian at times.  Yesterday was coming down stairs and developed chest pressure and SOB and his head felt strange and with some confusion, he fell or had to sit on stairs.  The pressure lasted 10-20 min.      He did have a normal myoview stress test in 05/2017.   [facility] also told him he had a thick heart and ? Valve calcification.  He dose smoke- small cigars 5-7 per day and has used cocaine last time 1 week ago.  + FH premature CAD with sister who died of MI at 29.  EKG I personally reviewed with SR LVH, Q waves V1-3 and T wave inversion lateral leads may be related to LVH.  Today SB at 64 LVH and T waves no longer inverted. Tele I personally reviewed SR to SB with 5 beats of tachycardia.      Troponin poc 0.04 Troponin I 0.06; 0.05; 0.05 K+ 4.1, Cr 1.70 Hgb 14.5 LDL 86 HDL 51  CXR 2 V with no cardiopulmonary disease, +aortic atherosclerosis   Currently no chest pain, complains of hunger.  No SOB or feeling confused now.  We discussed stopping tobacco and cocaine. Fluids at 75 cc/hr overnight.    Past  Medical History:  Diagnosis Date  . Back pain   . Bronchitis   . GERD (gastroesophageal reflux disease)   . Hypertension     Past Surgical History:  Procedure Laterality Date  . BACK SURGERY       Home Medications:  Prior to Admission medications   Medication Sig Start Date End Date Taking? Authorizing Provider  amLODipine (NORVASC) 10 MG tablet Take 5 mg by mouth daily.   Yes [provider]  aspirin EC 81 MG tablet Take 81 mg by mouth daily.   Yes [provider]  enalapril (VASOTEC) 20 MG tablet Take 1 tablet (20 mg total) by mouth 2 (two) times daily. 09/01/13  Yes Reyne Dumas, MD  gabapentin (NEURONTIN) 300 MG capsule Take 300 mg by mouth 3 (three) times daily. For moods and anxiety-may also help with chronic pain   Yes [provider]  hydrochlorothiazide (HYDRODIURIL) 25 MG tablet Take 1 tablet (25 mg total) by mouth daily. 09/01/13  Yes Reyne Dumas, MD  meloxicam (MOBIC) 15 MG tablet Take 15 mg by mouth daily.   Yes [provider]  methocarbamol (ROBAXIN) 750 MG tablet Take 500 mg by mouth every 8 (eight) hours as needed for muscle spasms (neck and back pain).    Yes [provider]  cloNIDine (CATAPRES) 0.1 MG tablet Take 1 tablet (0.1 mg total) by mouth daily. Patient not taking: Reported on 08/20/2017 09/01/13   Reyne Dumas, MD  HYDROcodone-acetaminophen (NORCO/VICODIN) 5-325 MG tablet Take 1-2 tablets by mouth every 6 hours as needed for pain. Patient not taking: Reported on 08/20/2017 12/09/15   Pisciotta, Elmyra Ricks, PA-C    Inpatient Medications: Scheduled Meds: . amLODipine  5 mg Oral Daily  . aspirin EC  81 mg Oral Daily  . enalapril  20 mg Oral BID  . gabapentin  300 mg Oral TID  . hydrochlorothiazide  25 mg Oral Daily   Continuous Infusions: . sodium chloride 75 mL/hr at 08/20/17 1623   PRN Meds: acetaminophen, methocarbamol, ondansetron (ZOFRAN) IV  Allergies:   No Known Allergies  Social History:   Social  History   Socioeconomic History  . Marital status: Single    Spouse name: Not on file  . Number of children: Not on file  . Years of education: Not on file  . Highest education level: Not on file  Social Needs  . Financial resource strain: Not on file  . Food insecurity - worry: Not on file  . Food insecurity - inability: Not on file  . Transportation needs - medical: Not on file  . Transportation needs - non-medical: Not on file  Occupational History  . Not on file  Tobacco Use  . Smoking status: Current Every Day Smoker    Packs/day: 0.50    Types: Cigarettes  . Smokeless tobacco: Never Used  Substance and Sexual Activity  . Alcohol use: Yes    Alcohol/week: 4.2 oz    Types: 7 Cans of beer per week    Comment: 24 oz can of beer per day  . Drug use: Yes    Types: Cocaine    Comment: pt states he uses crack cocaine at least 2xweek.   Marland Kitchen Sexual activity: Yes  Other Topics Concern  . Not on file  Social History Narrative  . Not on file    Family History:    Family History  Problem Relation Age of Onset  . Heart disease Mother   . Heart attack Sister 69     ROS:  Please see the history of present illness.  ROS  General:no colds or fevers, no weight changes Skin:no rashes or ulcers HEENT:no blurred vision, no congestion CV:see HPI PUL:see HPI GI:no diarrhea constipation or melena, no indigestion GU:no hematuria, no dysuria MS:no joint pain, no claudication Neuro:no syncope, no lightheadedness Endo:no diabetes, no thyroid disease    Physical Exam/Data:   Vitals:   08/20/17 1623 08/20/17 1746 08/20/17 2118 08/21/17 0527  BP: (!) 159/77 (!) 159/77 (!) 156/67 (!) 160/80  Pulse: 68 68 60 (!) 57  Resp: 18 18 20 18   Temp: 98.3 F (36.8 C) 98.3 F (36.8 C) 98.2 F (36.8 C) 98.1 F (36.7 C)  TempSrc: Oral  Oral Oral  SpO2: 100%  99% 100%  Weight:  190 lb (86.2 kg)    Height:  5\' 10"  (1.778 m)      Intake/Output Summary (Last 24 hours) at 08/21/2017  0941 Last data filed at 08/21/2017 0839 Gross per 24 hour  Intake 1776.25 ml  Output 775 ml  Net 1001.25 ml   Filed Weights   08/20/17 1746  Weight: 190 lb (86.2 kg)   Body mass index is 27.26 kg/m.  General:  Well nourished, well developed, in no acute distress, uses cane to walk for balance   HEENT:  normal Lymph: no adenopathy Neck: no JVD Endocrine:  No thryomegaly Vascular: No carotid bruits; 2+ pedal pulses bil Cardiac:  normal S1, S2; RRR; no murmur gallup rub or click Lungs:  clear to auscultation bilaterally, no wheezing, rhonchi or rales  Abd: soft, nontender, no hepatomegaly  Ext: no edema Musculoskeletal:  No deformities, BUE and BLE strength normal and equal Skin: warm and dry  Neuro:  Alert and oriented X 3 MAE follows commands, no focal abnormalities noted Psych:  Normal affect    Relevant CV Studies: None here - done at [facility]   Laboratory Data:  Chemistry Recent Labs  Lab 08/20/17 0935  NA 137  K 4.1  CL 109  CO2 24  GLUCOSE 123*  BUN 24*  CREATININE 1.70*  CALCIUM 9.1  GFRNONAA 42*  GFRAA 49*  ANIONGAP 4*    No results for input(s): PROT, ALBUMIN, AST, ALT, ALKPHOS, BILITOT in the last 168 hours. Hematology Recent Labs  Lab 08/20/17 0935  WBC 5.7  RBC 5.11  HGB 14.5  HCT 42.9  MCV 84.0  MCH 28.4  MCHC 33.8  RDW 14.5  PLT 201   Cardiac Enzymes Recent Labs  Lab 08/20/17 1629 08/20/17 1914 08/20/17 2203  TROPONINI 0.06* 0.05* 0.05*    Recent Labs  Lab 08/20/17 0946  TROPIPOC 0.04    BNPNo results for input(s): BNP, PROBNP in the last 168 hours.  DDimer No results for input(s): DDIMER in the last 168 hours.  Radiology/Studies:  Dg Chest 2 View  Result Date: 08/20/2017 CLINICAL DATA:  62 year old male with new onset of mid left-sided chest pain and dizziness since 7:30 a.m. EXAM: CHEST  2 VIEW COMPARISON:  Chest x-ray 05/20/2013. FINDINGS: Lung volumes are normal. No consolidative airspace disease. No pleural effusions. No  pneumothorax. No pulmonary nodule or mass noted. Pulmonary vasculature and the cardiomediastinal silhouette are within normal limits. Atherosclerosis in the thoracic aorta. IMPRESSION: 1.  No radiographic evidence of acute cardiopulmonary disease. 2. Aortic atherosclerosis. Electronically Signed   By: Vinnie Langton M.D.   On: 08/20/2017 09:54    Assessment and Plan:   1. Chest pain with mild bump in troponin, has had nuc study last year that is reported as normal.  ? Cath  Add statin, hold any BB due to bradycardia - is on ASA 81 mg 2. HTN elevated today on amlodipine; vasotec; HCTZ at home- will hold the vasotec and HCTZ for now for possible cath.   Will add hydralazine and imdur for now.  3. Tobacco use and cocaine use 4. LVH on EKG evaluate -echo - 5. CKD-3 holding vasotec and HCTZ- had fluids at 75/hr overnight his Cr is baseline.  6. HLD , stable currently but will add statin for now    For questions or updates, please contact [place] Please consult [email or web address] for contact info under Cardiology/STEMI.   Signed, Cecilie Kicks, NP  08/21/2017 9:41 AM As above, patient seen and examined.  Briefly he is a 62 year old male with past medical history of hypertension, gastroesophageal reflux disease, ongoing substance abuse, noncompliance for evaluation of chest pain.  Patient is an extremely difficult historian.  He apparently has chest pressure with vigorous activities at times relieved with rest.  However he does not have this at all times.  He had chest pressure with climbing stairs yesterday and apparently fell but denies syncope.  He states he was "disoriented".  He has been admitted and cardiology asked to evaluate.  Note he states he uses cocaine every  1-2 weeks.  He also states he does not take his medications occasionally. Creatinine is 1.73.  Troponins are 0.04, 0.06, 0.05 and 0.05.  Electrocardiogram shows sinus rhythm, left ventricular hypertrophy with repolarization  abnormality and prior septal infarct cannot be excluded. 1 chest pain-difficult situation.  Patient is a poor historian.  He does sound to have exertional angina but has not had symptoms at rest.  His enzymes are not consistent with an acute coronary syndrome.  I have considered cardiac catheterization.  Given baseline renal insufficiency he would be at risk for contrast nephropathy.  More importantly he has a history of substance abuse including cocaine use every 1-2 weeks.  He also states he occasionally does not take his medications.  If he underwent cardiac catheterization followed by PCI and did not take dual antiplatelet therapy he would be at risk for stent thrombosis.  I would therefore favor proceeding with a [place] nuclear study for risk stratification.  We will also check echocardiogram for LV function.  If nuclear study is normal or low risk would treat medically and have patient follow-up in office.  If he can avoid cocaine and demonstrates compliance we could proceed with catheterization in the future.  For now would treat with aspirin and statin.  Increase amlodipine to 10 mg daily.  Add isosorbide 60 mg daily.  We will avoid beta blockade given continuing cocaine use.  2 cocaine abuse-patient counseled on discontinuing.  3 tobacco abuse-patient counseled on discontinuing.  4 Hypertension-blood pressure is elevated. Increase amlodipine to 10 mg daily and add isosorbide 60 mg daily.  This should help both with blood pressure and as antianginals.  5 Chronic stage III kidney disease-patient would be at  risk for contrast nephropathy   If catheterization pursued.  His prognosis will be poor if he continues to abuse cocaine.  Kirk Ruths, MD

## 2017-08-21 NOTE — Progress Notes (Signed)
Patient was transferred to [facility] for echocardiogram and heart study at [facility]

## 2017-08-21 NOTE — Progress Notes (Signed)
Interim progress note:   Echo and stress test reviewed with Dr. Percival Spanish. No active reversible ischemia, but high risk. EF diminished. Will keep inpatient tonight, continue monitoring BP and treating prn in addition to new medications started today. I've made him NPO after midnight and will recheck Cr in AM in case decision for catheterization is made.   Vance Gather, MD 08/21/2017 5:20 PM

## 2017-08-21 NOTE — Progress Notes (Signed)
Patient came back to 5E at 1640; alert and oriented; vital signs was taken; SB and BP elevated; medication given as ordered. Will monitor patient closely.

## 2017-08-21 NOTE — Care Management Note (Signed)
Case Management Note  Patient Details  Name: Ronald Reeves MRN: [id number] Date of Birth: [birth year]  Subjective/Objective:                  Chest pain with exertion/shortness of breath.  Action/Plan: Date:  August 21, 2017 Chart reviewed for concurrent status and case management needs.  Will continue to follow patient progress.  Discharge Planning: following for needs.  None present at this time of review. Expected discharge date: August 24 2017 Velva Harman, BSN, [place], [place]   Expected Discharge Date:  (unknown)               Expected Discharge Plan:  Home/Self Care  In-House Referral:     Discharge planning Services  CM Consult  Post Acute Care Choice:    Choice offered to:     DME Arranged:    DME Agency:     HH Arranged:    HH Agency:     Status of Service:  In process, will continue to follow  If discussed at Long Length of Stay Meetings, dates discussed:    Additional Comments:  Leeroy Cha, RN 08/21/2017, 9:58 AM

## 2017-08-21 NOTE — Progress Notes (Signed)
[facility] PROGRESS NOTE  Ronald Reeves  QXI:[id number] DOB: 11-Nov-1955 DOA: 08/20/2017 PCP: Gerome Sam, MD; [place]  Brief Narrative: Ronald Reeves is a 62 y.o. male with a history of HTN, GERD, tobacco and cocaine use and incomplete medical adherence who presented to the ED with months of worsening central chest tightness becoming more frequently associated with exertion that is relieved by rest and radiates to the left chest. Episodes increasing in frequency, but worst just PTA when climbing stairs in home and an episode associated with dyspnea, and fall due to weakness and lightheadedness without LOC. He had been evaluated by PCP with reportedly normal stress test Oct 2018. On evaluation in ED here, ECG demonstrated LVH with septal q waves and lateral TWI, troponin 0.04 > 0.06. CXR demonstrated aortic atherosclerosis but no active disease. Cardiology was consulted and considered catheterization, opting instead for echocardiogram and stress myoview due to SCr 1.7.   Subjective: Hungry, no chest pain. Smokes a few black & milds per day, not interested in quitting. Uses cocaine about weekly. Has been smoking cigarettes ~1/3 - 1/2 ppd since age 66. + FH of CAD w/sister dying at 39 of MI.   Objective: BP (!) 153/71 (BP Location: Right Arm)   Pulse (!) 56   Temp 98.7 F (37.1 C) (Oral)   Resp 18   Ht 5\' 10"  (1.778 m)   Wt 86.2 kg (190 lb)   SpO2 100%   BMI 27.26 kg/m   Gen: Pleasant 61yo gentleman with cane at bedside Pulm: Clear and nonlabored on room air  CV: RRR, no murmur, no JVD, no edema GI: Soft, NT, ND, +BS  Neuro: Alert and oriented. No focal deficits. Ext: Warm, no deformities Skin: No rashes, lesions or ulcers  Assessment & Plan: Chest pain: Troponins flat maxed at 0.06. ECG with initial TWI's in lateral leads which have resolved.  - Echocardiogram and stress myoview per cardiology.  - LDL 86 > statin added per cards. - Continue ASA, no hypoxia or active  chest pain.  - Starting imdur and increasing norvasc as anti-anginals/BP control.   Stage III CKD: Presumptive Dx given no improvement w/IVF's, doubt AKI.  - Renally dose medications, HCTZ may not be effective anti-hypertensive.  - Avoid NSAIDs  HTN:  - Started imdur, increased norvasc as above - Holding enalapril and HCTZ currently. Pt not taking clonidine.  Tobacco use: Precontemplative - Brief cessation counseling provided. Urged to follow up with PCP.   Cocaine use:  - Cessation counseling provided - Avoiding beta blocker.   Chronic back pain:  - Continue neurontin, robaxin  Vance Gather, MD [facility] Pager [phone]  If 7PM-7AM, please contact night-coverage [email or web address] Password [facility] 08/21/2017, 2:29 PM

## 2017-08-22 DIAGNOSIS — I208 Other forms of angina pectoris: Secondary | ICD-10-CM

## 2017-08-22 LAB — BASIC METABOLIC PANEL
ANION GAP: 6 (ref 5–15)
BUN: 24 mg/dL — ABNORMAL HIGH (ref 6–20)
CALCIUM: 8.5 mg/dL — AB (ref 8.9–10.3)
CO2: 24 mmol/L (ref 22–32)
CREATININE: 1.9 mg/dL — AB (ref 0.61–1.24)
Chloride: 108 mmol/L (ref 101–111)
GFR, EST AFRICAN AMERICAN: 43 mL/min — AB (ref >60)
GFR, EST NON AFRICAN AMERICAN: 37 mL/min — AB (ref >60)
Glucose, Bld: 109 mg/dL — ABNORMAL HIGH (ref 65–99)
Potassium: 3.9 mmol/L (ref 3.5–5.1)
SODIUM: 138 mmol/L (ref 135–145)

## 2017-08-22 MED ORDER — HYDRALAZINE HCL 25 MG PO TABS: 25.0000 mg | ORAL_TABLET | Freq: Two times a day (BID) | ORAL | 0 refills | Status: DC

## 2017-08-22 MED ORDER — HYDRALAZINE HCL 25 MG PO TABS
25.0000 mg | ORAL_TABLET | Freq: Two times a day (BID) | ORAL | Status: DC
  Administered 2017-08-22: 25 mg via ORAL
  Filled 2017-08-22: qty 1

## 2017-08-22 MED ORDER — AMLODIPINE BESYLATE 10 MG PO TABS: 10.0000 mg | ORAL_TABLET | Freq: Every day | ORAL | 0 refills | Status: DC

## 2017-08-22 MED ORDER — ISOSORBIDE MONONITRATE ER 60 MG PO TB24: 60.0000 mg | ORAL_TABLET | Freq: Every day | ORAL | 0 refills | Status: DC

## 2017-08-22 NOTE — Progress Notes (Signed)
Progress Note  Patient Name: Ronald Reeves Date of Encounter: 08/22/2017  Primary Cardiologist: Glenetta Hew  Subjective   NO CP  NO SOB   Inpatient Medications    Scheduled Meds: . amLODipine  10 mg Oral Daily  . aspirin EC  81 mg Oral Daily  . atorvastatin  40 mg Oral q1800  . enoxaparin (LOVENOX) injection  40 mg Subcutaneous Daily  . gabapentin  300 mg Oral TID  . isosorbide mononitrate  60 mg Oral Daily   Continuous Infusions:  PRN Meds: acetaminophen, hydrALAZINE, methocarbamol, ondansetron (ZOFRAN) IV   Vital Signs    Vitals:   08/21/17 1645 08/21/17 1800 08/21/17 2222 08/22/17 0500  BP: (!) 186/73 (!) 154/54 (!) 146/73 136/62  Pulse: (!) 57 70 62 66  Resp:  18 18 18   Temp: 98.4 F (36.9 C)  97.8 F (36.6 C) 98.1 F (36.7 C)  TempSrc: Oral  Oral Oral  SpO2: 100% 100% 100% 100%  Weight:      Height:        Intake/Output Summary (Last 24 hours) at 08/22/2017 1040 Last data filed at 08/22/2017 0900 Gross per 24 hour  Intake 1015 ml  Output -  Net 1015 ml   Filed Weights   08/20/17 1746  Weight: 190 lb (86.2 kg)    Telemetry    SR - Personally Reviewed  ECG     Physical Exam   GEN: No acute distress.   Neck: No JVD Cardiac: RRR, no murmurs, rubs, or gallops.  Respiratory: Clear to auscultation bilaterally. GI: Soft, nontender, non-distended  MS: No edema; No deformity. Neuro:  Nonfocal  Psych: Normal affect   Labs    Chemistry Recent Labs  Lab 08/20/17 0935 08/21/17 0606 08/22/17 0608  NA 137 135 138  K 4.1 4.2 3.9  CL 109 108 108  CO2 24 22 24   GLUCOSE 123* 99 109*  BUN 24* 22* 24*  CREATININE 1.70* 1.73* 1.90*  CALCIUM 9.1 8.4* 8.5*  PROT  --  6.5  --   ALBUMIN  --  3.2*  --   AST  --  24  --   ALT  --  18  --   ALKPHOS  --  115  --   BILITOT  --  0.5  --   GFRNONAA 42* 41* 37*  GFRAA 49* 48* 43*  ANIONGAP 4* 5 6     Hematology Recent Labs  Lab 08/20/17 0935  WBC 5.7  RBC 5.11  HGB 14.5  HCT 42.9  MCV  84.0  MCH 28.4  MCHC 33.8  RDW 14.5  PLT 201    Cardiac Enzymes Recent Labs  Lab 08/20/17 1629 08/20/17 1914 08/20/17 2203  TROPONINI 0.06* 0.05* 0.05*    Recent Labs  Lab 08/20/17 0946  TROPIPOC 0.04     BNPNo results for input(s): BNP, PROBNP in the last 168 hours.   DDimer No results for input(s): DDIMER in the last 168 hours.   Radiology    Nm Myocar Multi W/spect W/wall Motion / Ef  Result Date: 08/21/2017 CLINICAL DATA:  Chest pain. History of hypertension. Shortness of breath. EXAM: MYOCARDIAL IMAGING WITH SPECT (REST AND PHARMACOLOGIC-STRESS) GATED LEFT VENTRICULAR WALL MOTION STUDY LEFT VENTRICULAR EJECTION FRACTION TECHNIQUE: Standard myocardial SPECT imaging was performed after resting intravenous injection of 10 mCi Tc-55m tetrofosmin. Subsequently, intravenous infusion of Lexiscan was performed under the supervision of the Cardiology staff. At peak effect of the drug, 30 mCi Tc-78m tetrofosmin was injected intravenously  and standard myocardial SPECT imaging was performed. Quantitative gated imaging was also performed to evaluate left ventricular wall motion, and estimate left ventricular ejection fraction. COMPARISON:  Chest radiographs 08/20/2017. FINDINGS: Perfusion: Large fixed inferior wall perfusion defect extending from the apex to the base. No definite reversible components allowing for adjacent subdiaphragmatic activity on the rest images. Wall Motion: Decreased wall motion and myocardial thickening in the inferior wall and septum. Moderately dilated left ventricle. Left Ventricular Ejection Fraction: 38 % End diastolic volume 549 ml End systolic volume 826 ml IMPRESSION: 1. Large area of scarring in the inferior wall consistent with previous myocardial infarction. No definite reversible components to suggest pharmacologically induced myocardial ischemia. 2. Septal and inferior wall hypokinesis. 3. Left ventricular ejection fraction 38% 4. Non invasive risk  stratification*: High *2012 Appropriate Use Criteria for Coronary Revascularization Focused Update: J Am Coll Cardiol. 4158;30(9):407-680. http://content.airportbarriers.com.aspx?articleid=[email or web address] Electronically Signed   By: Richardean Sale M.D.   On: 08/21/2017 17:05    Cardiac Studies   Echo:  LVEF 45 to 505  Hypokinesis of the base/mid inferolateral and inferior wall  Mild AI    Patient Profile     62 y.o. male history of HTN, GERD, back pain, CKD, substance abuse Presented with CP and SOB    Assessment & Plan    1  CP  Plan for lexiscan myovue   Echo yesterday   I have reviewed images  LVEF probably 50 to 55% wth mild basal inferior/inferolateral hypokinesis   Nuclear scan above consistent with inferior scar (EF underestamated   SOme may reflect diaphragm attenuation) I have reviewed with pt medical vs invasivve eval  I have stressed with him that both require compliance but especially invasive if stent placed  He understands pros/cons Pt would like to pursue medical Rx first  If has symptoms on medical therapy then would set up for cath/ possible intervention.   2  Cocaine abuse  Counselled on importance of not using  Wants to get help 3  Tobacco abuse  Counselled on quitting    4  HTN  Amlodipine increased and isorbid added 60  Will add hydralazine 25 bid    5  CKD  Stage III.  Will need to follow as outpt  WOrk on BP    6  HL  On statin now  Will follow as outpt  WIll make sure he has f/u as outpt     For questions or updates, please contact [place] Please consult [email or web address] for contact info under Cardiology/STEMI.      Signed, Dorris Carnes, MD  08/22/2017, 10:40 AM

## 2017-08-22 NOTE — Discharge Summary (Signed)
Physician Discharge Summary  Tregan Read ELF:[id number] DOB: 1955/12/16 DOA: 08/20/2017  PCP: Gerome Sam, MD  Admit date: 08/20/2017 Discharge date: 08/22/2017  Admitted From: Home Disposition: Home   Recommendations for Outpatient Follow-up:  1. Follow up with PCP in 1-2 weeks 2. Please obtain BMP/CBC in one week 3. Follow up with cardiology as an outpatient, either at the [facility] or at [facility]. If symptoms continue despite medical therapy, will need invasive evaluation.   Home Health: None Equipment/Devices: None Discharge Condition: Stable CODE STATUS: Full Diet recommendation: Heart healthy  Brief/Interim Summary: Zyair Russi is a 62 y.o. male with a history of HTN, GERD, tobacco and cocaine use and incomplete medical adherence who presented to the ED with months of worsening central chest tightness becoming more frequently associated with exertion that is relieved by rest and radiates to the left chest. Episodes increasing in frequency, but worst just PTA when climbing stairs in home and an episode associated with dyspnea, and fall due to weakness and lightheadedness without LOC. He had been evaluated by PCP with reportedly normal stress test Oct 2018. On evaluation in ED here, ECG demonstrated LVH with septal q waves and lateral TWI, troponin 0.04 > 0.06. CXR demonstrated aortic atherosclerosis but no active disease. Cardiology was consulted and considered catheterization, opting instead for echocardiogram and stress myoview due to SCr 1.7. Without reversible ischemia on stress test and resolution of symptoms, the patient has opted for medical therapy trial prior to proceeding with catheterization.   Discharge Diagnoses:  Principal Problem:   Chest pain Active Problems:   Chronic kidney disease   Essential hypertension, malignant   Chronic back pain  Chest pain: Troponins flat maxed at 0.06. ECG with initial TWI's in lateral leads which have resolved. Chest pain has  resolved. Stress test showed an area of scarring without reversibility.  - ASA, statin, started antianginals imdur, increase norvasc.  - Catheterization was considered based on stress test findings, though pt has remained asymptomatic, SCr up to 1.9 (risk of contrast nephropathy), and there is concern about thrombosis of a potential stent with h/o nonadherence and ongoing cocaine use. After discussion of options by cardiology, the patient opts for medical treatment. See their discussion below:  - Per Dr. Alan Ripper note 08/22/2017: "Echo yesterday   I have reviewed images  LVEF probably 50 to 55% wth mild basal inferior/inferolateral hypokinesis   Nuclear scan above consistent with inferior scar (EF underestamated   SOme may reflect diaphragm attenuation) I have reviewed with pt medical vs invasivve eval  I have stressed with him that both require compliance but especially invasive if stent placed  He understands pros/cons Pt would like to pursue medical Rx first  If has symptoms on medical therapy then would set up for cath/ possible intervention."    Stage III CKD: SCr 1.7 - 1.9.  - Hold ACE inhibitor until follow up with PCP. Avoid NSAIDs.   HTN:  - Started imdur, hydralazine, increased norvasc as above - Holding enalapril and HCTZ currently. Pt not taking clonidine.  Tobacco use: Precontemplative - Brief cessation counseling provided. Urged to follow up with PCP.   Cocaine use:  - Cessation counseling provided - Avoiding beta blocker.   Chronic back pain:  - Continue neurontin, robaxin  Discharge Instructions Discharge Instructions    Diet - low sodium heart healthy   Complete by:  As directed    Discharge instructions   Complete by:  As directed    You were admitted with chest pain  and found to have a scar on the inferior side of the heart, likely from an old heart attack. There was no evidence of a current heart attack and your symptoms have resolved. You will discharge today with  the following recommendations:  - STOP taking HCTZ, enelapril, and mobic - START taking hydralazine twice daily and imdur once daily - INCREASE norvasc dose from 5mg  daily to 10mg  daily.  - New prescriptions have been printed and provided at discharge.  - You will need to follow up with your PCP in the next 1 - 2 weeks for reevaluation.  - Stop using tobacco and cocaine - If your symptoms return, seek medical attention right away.   Increase activity slowly   Complete by:  As directed      Allergies as of 08/22/2017   No Known Allergies     Medication List    STOP taking these medications   cloNIDine 0.1 MG tablet Commonly known as:  CATAPRES   enalapril 20 MG tablet Commonly known as:  VASOTEC   hydrochlorothiazide 25 MG tablet Commonly known as:  HYDRODIURIL   HYDROcodone-acetaminophen 5-325 MG tablet Commonly known as:  NORCO/VICODIN   meloxicam 15 MG tablet Commonly known as:  MOBIC     TAKE these medications   amLODipine 10 MG tablet Commonly known as:  NORVASC Take 1 tablet (10 mg total) by mouth daily. What changed:  how much to take   aspirin EC 81 MG tablet Take 81 mg by mouth daily.   gabapentin 300 MG capsule Commonly known as:  NEURONTIN Take 300 mg by mouth 3 (three) times daily. For moods and anxiety-may also help with chronic pain   hydrALAZINE 25 MG tablet Commonly known as:  APRESOLINE Take 1 tablet (25 mg total) by mouth 2 (two) times daily.   isosorbide mononitrate 60 MG 24 hr tablet Commonly known as:  IMDUR Take 1 tablet (60 mg total) by mouth daily. Start taking on:  08/23/2017   methocarbamol 750 MG tablet Commonly known as:  ROBAXIN Take 500 mg by mouth every 8 (eight) hours as needed for muscle spasms (neck and back pain).      Follow-up Information    Gerome Sam, MD Follow up.   Specialty:  Internal Medicine Contact information: [address] [address] [phone]          No Known  Allergies  Consultations:  Cardiology, [facility], Drs. Crenshaw and [place].   Procedures/Studies: Dg Chest 2 View  Result Date: 08/20/2017 CLINICAL DATA:  62 year old male with new onset of mid left-sided chest pain and dizziness since 7:30 a.m. EXAM: CHEST  2 VIEW COMPARISON:  Chest x-ray 05/20/2013. FINDINGS: Lung volumes are normal. No consolidative airspace disease. No pleural effusions. No pneumothorax. No pulmonary nodule or mass noted. Pulmonary vasculature and the cardiomediastinal silhouette are within normal limits. Atherosclerosis in the thoracic aorta. IMPRESSION: 1.  No radiographic evidence of acute cardiopulmonary disease. 2. Aortic atherosclerosis. Electronically Signed   By: Vinnie Langton M.D.   On: 08/20/2017 09:54   Nm Myocar Multi W/spect W/wall Motion / Ef  Result Date: 08/21/2017 CLINICAL DATA:  Chest pain. History of hypertension. Shortness of breath. EXAM: MYOCARDIAL IMAGING WITH SPECT (REST AND PHARMACOLOGIC-STRESS) GATED LEFT VENTRICULAR WALL MOTION STUDY LEFT VENTRICULAR EJECTION FRACTION TECHNIQUE: Standard myocardial SPECT imaging was performed after resting intravenous injection of 10 mCi Tc-42m tetrofosmin. Subsequently, intravenous infusion of Lexiscan was performed under the supervision of the Cardiology staff. At peak effect  of the drug, 30 mCi Tc-25m tetrofosmin was injected intravenously and standard myocardial SPECT imaging was performed. Quantitative gated imaging was also performed to evaluate left ventricular wall motion, and estimate left ventricular ejection fraction. COMPARISON:  Chest radiographs 08/20/2017. FINDINGS: Perfusion: Large fixed inferior wall perfusion defect extending from the apex to the base. No definite reversible components allowing for adjacent subdiaphragmatic activity on the rest images. Wall Motion: Decreased wall motion and myocardial thickening in the inferior wall and septum. Moderately dilated left ventricle. Left Ventricular  Ejection Fraction: 38 % End diastolic volume 335 ml End systolic volume 456 ml IMPRESSION: 1. Large area of scarring in the inferior wall consistent with previous myocardial infarction. No definite reversible components to suggest pharmacologically induced myocardial ischemia. 2. Septal and inferior wall hypokinesis. 3. Left ventricular ejection fraction 38% 4. Non invasive risk stratification*: High *2012 Appropriate Use Criteria for Coronary Revascularization Focused Update: J Am Coll Cardiol. 2563;89(3):734-287. http://content.airportbarriers.com.aspx?articleid=[email or web address] Electronically Signed   By: Richardean Sale M.D.   On: 08/21/2017 17:05   Echocardiogram 08/21/2017: - Left ventricle: The cavity size was normal. There was moderate   concentric hypertrophy. Systolic function was mildly reduced. The   estimated ejection fraction was in the range of 45% to 50%. There   is hypokinesis of the basal-midinferolateral and inferior   myocardium. The study is not technically sufficient to allow   evaluation of LV diastolic function. - Aortic valve: There was mild regurgitation. - Mitral valve: There was trivial regurgitation. - Left atrium: The atrium was mildly dilated. - Pulmonic valve: There was trivial regurgitation.  Subjective: Feels well, wants to go home. No chest pain or dyspnea or dizziness or weakness.   Discharge Exam: Vitals:   08/21/17 2222 08/22/17 0500  BP: (!) 146/73 136/62  Pulse: 62 66  Resp: 18 18  Temp: 97.8 F (36.6 C) 98.1 F (36.7 C)  SpO2: 100% 100%   General: Pt is alert, awake, not in acute distress Cardiovascular: RRR, S1/S2 +, no rubs, no gallops Respiratory: CTA bilaterally, no wheezing, no rhonchi Abdominal: Soft, NT, ND, bowel sounds + Extremities: No edema, no cyanosis  Labs: Basic Metabolic Panel: Recent Labs  Lab 08/20/17 0935 08/21/17 0606 08/22/17 0608  NA 137 135 138  K 4.1 4.2 3.9  CL 109 108 108  CO2 24 22 24   GLUCOSE 123* 99 109*  BUN  24* 22* 24*  CREATININE 1.70* 1.73* 1.90*  CALCIUM 9.1 8.4* 8.5*  MG  --  2.0  --    Liver Function Tests: Recent Labs  Lab 08/21/17 0606  AST 24  ALT 18  ALKPHOS 115  BILITOT 0.5  PROT 6.5  ALBUMIN 3.2*   CBC: Recent Labs  Lab 08/20/17 0935  WBC 5.7  HGB 14.5  HCT 42.9  MCV 84.0  PLT 201   Cardiac Enzymes: Recent Labs  Lab 08/20/17 1629 08/20/17 1914 08/20/17 2203  TROPONINI 0.06* 0.05* 0.05*   Lipid Profile Recent Labs    08/21/17 0606  CHOL 148  HDL 51  LDLCALC 86  TRIG 55  CHOLHDL 2.9   Thyroid function studies Recent Labs    08/21/17 0606  TSH 1.020    Time coordinating discharge: Approximately 40 minutes  Vance Gather, MD  [facility] 08/22/2017, 12:17 PM Pager [phone]

## 2017-08-26 LAB — PULMONARY FUNCTION TEST
DLCO: 24.3 ml/mmHg sec
FEV1/FVC: 71 %
FEV1: 3.38 L
FVC: 4.79 L
TLC: 6.91

## 2017-09-01 ENCOUNTER — Ambulatory Visit: Payer: Non-veteran care | Admitting: Cardiovascular Disease

## 2017-09-07 ENCOUNTER — Telehealth: Payer: Self-pay | Admitting: Physician Assistant

## 2017-09-07 ENCOUNTER — Encounter: Payer: Self-pay | Admitting: *Deleted

## 2017-09-07 NOTE — Telephone Encounter (Signed)
Pt says he ineeds aletter stating that he has an appointment with Eulas Post on 09-24-17. Please call,he will give you the details if you have any questions.Pt says he need this letter asap please.

## 2017-09-07 NOTE — Telephone Encounter (Signed)
Spoke with pt, letter generated and placed at the front desk for patient pick up for the [place].

## 2017-09-15 ENCOUNTER — Ambulatory Visit: Payer: Non-veteran care | Admitting: Cardiology

## 2017-09-22 ENCOUNTER — Ambulatory Visit (INDEPENDENT_AMBULATORY_CARE_PROVIDER_SITE_OTHER): Payer: Non-veteran care | Admitting: Physician Assistant

## 2017-09-22 ENCOUNTER — Encounter: Payer: Self-pay | Admitting: Physician Assistant

## 2017-09-22 VITALS — BP 140/68 | HR 64 | Ht 71.0 in | Wt 180.6 lb

## 2017-09-22 DIAGNOSIS — I10 Essential (primary) hypertension: Secondary | ICD-10-CM | POA: Diagnosis not present

## 2017-09-22 DIAGNOSIS — N183 Chronic kidney disease, stage 3 unspecified: Secondary | ICD-10-CM

## 2017-09-22 DIAGNOSIS — R9439 Abnormal result of other cardiovascular function study: Secondary | ICD-10-CM | POA: Diagnosis not present

## 2017-09-22 LAB — BASIC METABOLIC PANEL
BUN / CREAT RATIO: 7 — AB (ref 9–20)
BUN: 12 mg/dL (ref 6–24)
CHLORIDE: 105 mmol/L (ref 96–106)
CO2: 21 mmol/L (ref 20–29)
Calcium: 9.1 mg/dL (ref 8.7–10.2)
Creatinine, Ser: 1.71 mg/dL — ABNORMAL HIGH (ref 0.76–1.27)
GFR calc non Af Amer: 43 mL/min/{1.73_m2} — ABNORMAL LOW (ref >59)
GFR, EST AFRICAN AMERICAN: 50 mL/min/{1.73_m2} — AB (ref >59)
GLUCOSE: 110 mg/dL — AB (ref 65–99)
Potassium: 4.4 mmol/L (ref 3.5–5.2)
SODIUM: 140 mmol/L (ref 134–144)

## 2017-09-22 MED ORDER — ISOSORBIDE MONONITRATE ER 30 MG PO TB24: 30.0000 mg | ORAL_TABLET | Freq: Every day | ORAL | 5 refills | Status: DC

## 2017-09-22 MED ORDER — LABETALOL HCL 100 MG PO TABS: 100.0000 mg | ORAL_TABLET | Freq: Two times a day (BID) | ORAL | 5 refills | Status: DC

## 2017-09-22 NOTE — Progress Notes (Signed)
Cardiology Office Note   Date:  09/22/2017   ID:  Ronald Reeves, DOB 1956-05-17, MRN [id number]  PCP:  Gerome Sam, MD  Cardiologist: Dr. [name], 08/21/2017 in hospital Rosaria Ferries, PA-C   Chief Complaint  Patient presents with  . Follow-up    History of Present Illness: Ronald Reeves is a 62 y.o. male with a history of GERD, HTN, back pain, cocaine use, tobacco use.  Admitted 1/10-1/06/2018 with chest pain and shortness of breath.  He had an echocardiogram and a Myoview.  Results are below.  Dr. Harrington Challenger reviewed the results.  She felt his EF was at least 50% with mild basal infero-/inferolateral hypokinesis.  She also felt his EF was underestimated on the Myoview but he does have an inferior scar although it may be partly diaphragmatic attenuation.  The patient was offered invasive evaluation but Cr 1.90 and compliance was stressed if a stent was placed and he wished to pursue medical therapy.  Ronald Reeves presents for cardiology follow up.  Pt has gotten light-headed when he exerts himself (with yard work) or with orthostatic changes. Sometimes he has to sit down and rest. He also notices pounding in his head when he exerts himself.   He has had some lower L chest pain, burning (The CP in the hospital was heaviness, mid-chest). The pain does not last long, not exertional. He has rx for Pepcid, takes that.   He has not had any of the chest heaviness since he came out of the hospital.   He is not pushing himself, gets sx when he does that.   He is having problems with cramping pain in his legs when he gets up in the morning.  The pain is in his inner thighs and goes down to his feet when he gets out of bed.  It resolves with him moving around and does not return.  He has not had numbness or calf pain with ambulation.  He does not have a days of the week pill box, but states he is compliant with his medications.  He denies cocaine use since discharge.   Past Medical  History:  Diagnosis Date  . Back pain   . Bronchitis   . GERD (gastroesophageal reflux disease)   . Hypertension     Past Surgical History:  Procedure Laterality Date  . BACK SURGERY      Current Outpatient Medications  Medication Sig Dispense Refill  . amLODipine (NORVASC) 10 MG tablet Take 1 tablet (10 mg total) by mouth daily. 30 tablet 0  . aspirin EC 81 MG tablet Take 81 mg by mouth daily.    Marland Kitchen gabapentin (NEURONTIN) 300 MG capsule Take 300 mg by mouth 3 (three) times daily. For moods and anxiety-may also help with chronic pain    . hydrALAZINE (APRESOLINE) 25 MG tablet Take 1 tablet (25 mg total) by mouth 2 (two) times daily. 60 tablet 0  . isosorbide mononitrate (IMDUR) 60 MG 24 hr tablet Take 1 tablet (60 mg total) by mouth daily. 30 tablet 0  . methocarbamol (ROBAXIN) 750 MG tablet Take 500 mg by mouth every 8 (eight) hours as needed for muscle spasms (neck and back pain).      No current facility-administered medications for this visit.     Allergies:   Patient has no known allergies.    Social History:  The patient  reports that he has been smoking cigarettes.  He has been smoking about 0.50 packs per day. he has  never used smokeless tobacco. He reports that he drinks about 4.2 oz of alcohol per week. He reports that he uses drugs. Drug: Cocaine.   Family History:  The patient's family history includes Heart attack (age of onset: 59) in his sister; Heart disease in his mother.    ROS:  Please see the history of present illness. All other systems are reviewed and negative.    PHYSICAL EXAM: VS:  BP 140/68 (BP Location: Left Arm)   Pulse 64   Ht 5\' 11"  (1.803 m)   Wt 180 lb 9.6 oz (81.9 kg)   BMI 25.19 kg/m  , BMI Body mass index is 25.19 kg/m. GEN: Well nourished, well developed, male in no acute distress  HEENT: normal for age  Neck: no JVD, no carotid bruit, no masses Cardiac: RRR; no murmur, no rubs, or gallops Respiratory:  clear to auscultation  bilaterally, normal work of breathing GI: soft, nontender, nondistended, + BS MS: no deformity or atrophy; no edema; distal pulses are 2+ in all 4 extremities   Skin: warm and dry, no rash Neuro:  Strength and sensation are intact Psych: euthymic mood, full affect   EKG:  EKG is not ordered today.   ECHO: 08/21/2017 - Left ventricle: The cavity size was normal. There was moderate   concentric hypertrophy. Systolic function was mildly reduced. The   estimated ejection fraction was in the range of 45% to 50%. There   is hypokinesis of the basal-midinferolateral and inferior   myocardium. The study is not technically sufficient to allow   evaluation of LV diastolic function. - Aortic valve: There was mild regurgitation. - Mitral valve: There was trivial regurgitation. - Left atrium: The atrium was mildly dilated. - Pulmonic valve: There was trivial regurgitation.  MYOVIEW: 08/21/2017 IMPRESSION: 1. Large area of scarring in the inferior wall consistent with previous myocardial infarction. No definite reversible components to suggest pharmacologically induced myocardial ischemia. 2. Septal and inferior wall hypokinesis. 3. Left ventricular ejection fraction 38% 4. Non invasive risk stratification*: High   Recent Labs: 08/20/2017: Hemoglobin 14.5; Platelets 201 08/21/2017: ALT 18; Magnesium 2.0; TSH 1.020 08/22/2017: BUN 24; Creatinine, Ser 1.90; Potassium 3.9; Sodium 138    Lipid Panel    Component Value Date/Time   CHOL 148 08/21/2017 0606   TRIG 55 08/21/2017 0606   HDL 51 08/21/2017 0606   CHOLHDL 2.9 08/21/2017 0606   VLDL 11 08/21/2017 0606   LDLCALC 86 08/21/2017 0606     Wt Readings from Last 3 Encounters:  09/22/17 180 lb 9.6 oz (81.9 kg)  08/20/17 190 lb (86.2 kg)  08/09/14 190 lb (86.2 kg)     Other studies Reviewed: Additional studies/ records that were reviewed today include: Hospital records and testing.  ASSESSMENT AND PLAN:  1.  Abnormal Myoview:  Patient has not had any of the chest pain that made him go to the hospital.  He reports compliance with aspirin and Imdur.  However, because of orthostatic changes in feelings of pounding in his head, I will decrease the Imdur to 30 mg a day.  He is to report if the symptoms get better.  He is encouraged to use My Chart to communicate with [place] to keep from having to come in so often as it finances are an issue.  2.  Hypertension: I reviewed his history and medications with the pharmacist.  His blood pressure is above target although it was improved on a recheck.  With the decrease in the Imdur, I will  add labetalol 100 mg twice daily.  He is encouraged to be compliant with his medications and a daily pillbox may help with this.  He thinks he has one.  He is encouraged to follow-up with the [facility] for hypertension management.  That will be cheaper for him.  If he continues to have orthostatic symptoms, the hydralazine may need to be discontinued  3.  Cocaine use: He states he has not done cocaine since he left the hospital.  He was advised that cocaine can kill him and is encouraged not to use it again.  Because of the cocaine use, a nonselective beta-blocker was selected.  4.  Chronic kidney disease: His creatinine was up on admission, the last labs we have are from 2015, when his creatinine was normal.  Recheck this today, but it is likely chronic kidney disease from uncontrolled hypertension.   Current medicines are reviewed at length with the patient today.  The patient does not have concerns regarding medicines.  The following changes have been made: Decrease Imdur, add labetalol  Labs/ tests ordered today include:   Orders Placed This Encounter  Procedures  . Basic metabolic panel     Disposition:   FU with Dr. [name]  Signed, Rosaria Ferries, PA-C  09/22/2017 10:43 AM    [facility] Phone: [phone]; Fax: [phone]  This note was written with the  assistance of speech recognition software. Please excuse any transcriptional errors.

## 2017-09-22 NOTE — Patient Instructions (Addendum)
Medication Instructions:  START Labetalol 100 mg take 1 tablet twice a day  DECREASE Imdur to 30mg  Take 1 tablet once a day  Labwork: Your physician recommends that you return for lab work in: TODAY-BMP  Testing/Procedures: None   Follow-Up: Your physician recommends that you schedule a follow-up appointment in: 3 months with Dr [name].  [facility]  MyChart instructions  DO NOT USE COCAINE.  Any Other Special Instructions Will Be Listed Below (If Applicable). If you need a refill on your cardiac medications before your next appointment, please call your pharmacy.

## 2017-09-24 ENCOUNTER — Ambulatory Visit: Payer: Non-veteran care | Admitting: Physician Assistant

## 2017-10-05 ENCOUNTER — Telehealth: Payer: Self-pay | Admitting: Physician Assistant

## 2017-10-05 NOTE — Telephone Encounter (Signed)
   Received a request to call the patient and talked to him about a heart catheterization.  History of abnormal Myoview recently, scar but no ischemia.  Called the patient.  He is concerned because it takes so long to get procedures approved through the [place].  If he needs a heart catheterization he wants to go ahead and start the process.  Explained that although he may have coronary artery disease based on his stress test, medical therapy is the best option.  This is because of his renal function.  To give him dye would stress his kidneys and we do not wish to do that unless it is absolutely necessary.  I explained that if medical therapy does not work, we can discuss cardiac catheterization again.  Otherwise, we will continue medical therapy and he is to let us know if he additional symptoms.  Rosaria Ferries, PA-C 10/05/2017 11:55 AM Beeper [phone]

## 2017-10-27 ENCOUNTER — Ambulatory Visit: Payer: Non-veteran care | Admitting: Physician Assistant

## 2017-12-23 NOTE — Progress Notes (Signed)
HPI: Follow-up chest pain.  Patient was seen January 2019 with chest pain.  Enzymes showed no clear infarct.  It was noted patient has baseline renal insufficiency and would be at risk for contrast nephropathy if catheterization performed.  It was also noted that he was not compliant with his medications at all times and therefore PCI would not be possible if he did not take dual antiplatelet therapy as prescribed if stent required. We felt if patient could avoid cocaine and demonstrate compliance then we could consider cardiac catheterization in the future.` Echocardiogram performed January 2019 interpreted as ejection fraction 45 to 50% by Dr. Harrington Challenger felt 50 to 55%.  Hypokinesis of the basal inferolateral and inferior myocardium.  Mild aortic insufficiency and mild left atrial enlargement.  Nuclear study January 2019 showed ejection fraction 38%, scar involving the inferior wall but no ischemia.  Options were reviewed with patient and he elected medical therapy.  Since last seen patient denies dyspnea, chest pain or syncope.  He states he did cocaine 1 week ago.  He continues to smoke.  He has missed his statin as he states he ran out and is waiting for prescription from [place].  Current Outpatient Medications  Medication Sig Dispense Refill  . amLODipine (NORVASC) 10 MG tablet Take 1 tablet (10 mg total) by mouth daily. 30 tablet 0  . aspirin EC 81 MG tablet Take 81 mg by mouth daily.    Marland Kitchen gabapentin (NEURONTIN) 300 MG capsule Take 300 mg by mouth 3 (three) times daily. For moods and anxiety-may also help with chronic pain    . hydrALAZINE (APRESOLINE) 25 MG tablet Take 50 mg by mouth every morning.    Marland Kitchen HYDROcodone-acetaminophen (NORCO) 5-325 MG tablet Take 1-2 tablets by mouth every 4 (four) hours as needed for severe pain. 10 tablet 0  . isosorbide mononitrate (IMDUR) 30 MG 24 hr tablet Take 1 tablet (30 mg total) by mouth daily. 30 tablet 5  . labetalol (NORMODYNE) 100 MG tablet Take 200 mg by  mouth every morning.    . methocarbamol (ROBAXIN) 500 MG tablet Take 1 tablet (500 mg total) by mouth 2 (two) times daily as needed for muscle spasms. 10 tablet 0   No current facility-administered medications for this visit.      Past Medical History:  Diagnosis Date  . Back pain   . Bronchitis   . GERD (gastroesophageal reflux disease)   . Hypertension     Past Surgical History:  Procedure Laterality Date  . BACK SURGERY      Social History   Socioeconomic History  . Marital status: Single    Spouse name: Not on file  . Number of children: Not on file  . Years of education: Not on file  . Highest education level: Not on file  Occupational History  . Not on file  Social Needs  . Financial resource strain: Not on file  . Food insecurity:    Worry: Not on file    Inability: Not on file  . Transportation needs:    Medical: Not on file    Non-medical: Not on file  Tobacco Use  . Smoking status: Current Every Day Smoker    Packs/day: 0.50    Types: Cigarettes  . Smokeless tobacco: Never Used  Substance and Sexual Activity  . Alcohol use: Yes    Alcohol/week: 4.2 oz    Types: 7 Cans of beer per week    Comment: 24 oz can of  beer per day  . Drug use: Yes    Types: Cocaine    Comment: pt states he uses crack cocaine at least 2xweek.   Marland Kitchen Sexual activity: Yes  Lifestyle  . Physical activity:    Days per week: Not on file    Minutes per session: Not on file  . Stress: Not on file  Relationships  . Social connections:    Talks on phone: Not on file    Gets together: Not on file    Attends religious service: Not on file    Active member of club or organization: Not on file    Attends meetings of clubs or organizations: Not on file    Relationship status: Not on file  . Intimate partner violence:    Fear of current or ex partner: Not on file    Emotionally abused: Not on file    Physically abused: Not on file    Forced sexual activity: Not on file  Other  Topics Concern  . Not on file  Social History Narrative  . Not on file    Family History  Problem Relation Age of Onset  . Heart disease Mother   . Heart attack Sister 64    ROS: no fevers or chills, productive cough, hemoptysis, dysphasia, odynophagia, melena, hematochezia, dysuria, hematuria, rash, seizure activity, orthopnea, PND, pedal edema, claudication. Remaining systems are negative.  Physical Exam: Well-developed well-nourished in no acute distress.  Skin is warm and dry.  HEENT is normal.  Neck is supple.  Chest is clear to auscultation with normal expansion.  Cardiovascular exam is regular rate and rhythm.  Abdominal exam nontender or distended. No masses palpated. Extremities show no edema. neuro grossly intact  A/P  1 chest pain-no further symptoms.  Previous nuclear study showed infarct but no ischemia.  We have elected to treat medically.  I am concerned about his ongoing substance abuse.  He also has a history of noncompliance with medications intermittently.  He has not been taking his statin as he states he ran out.  He is taking hydralazine and labetalol once daily instead of twice daily.  Therefore if patient underwent cardiac catheterization followed by PCI I would be concerned about risk of stent thrombosis if he was not compliant with dual antiplatelet therapy.  We can consider in the future if he demonstrates full compliance.  2 abnormal nuclear study-plan medical therapy as outlined under #1.  3 history of cocaine abuse-patient counseled on avoiding.  4 hypertension-blood pressure has improved.  I asked him to take his labetalol as prescribed at 100 mg twice daily and hydralazine 25 mg twice daily.  We can advance regimen as needed.  5 chronic stage III kidney disease-last creatinine 1.71.  6 tobacco abuse-patient counseled on discontinuing.  7 hyperlipidemia-resume Lipitor 40 mg daily.  Check lipids and liver in 4 weeks.  Kirk Ruths, MD

## 2017-12-31 ENCOUNTER — Emergency Department (HOSPITAL_COMMUNITY): Payer: Non-veteran care

## 2017-12-31 ENCOUNTER — Other Ambulatory Visit: Payer: Self-pay

## 2017-12-31 ENCOUNTER — Encounter (HOSPITAL_COMMUNITY): Payer: Self-pay

## 2017-12-31 ENCOUNTER — Emergency Department (HOSPITAL_COMMUNITY)
Admission: EM | Admit: 2017-12-31 | Discharge: 2018-01-01 | Disposition: A | Discharge status: Home / Self Care | Payer: Non-veteran care | Attending: Emergency Medicine | Admitting: Emergency Medicine

## 2017-12-31 DIAGNOSIS — I129 Hypertensive chronic kidney disease with stage 1 through stage 4 chronic kidney disease, or unspecified chronic kidney disease: Secondary | ICD-10-CM | POA: Insufficient documentation

## 2017-12-31 DIAGNOSIS — S39012A Strain of muscle, fascia and tendon of lower back, initial encounter: Secondary | ICD-10-CM | POA: Diagnosis not present

## 2017-12-31 DIAGNOSIS — Z7982 Long term (current) use of aspirin: Secondary | ICD-10-CM | POA: Insufficient documentation

## 2017-12-31 DIAGNOSIS — Y999 Unspecified external cause status: Secondary | ICD-10-CM | POA: Insufficient documentation

## 2017-12-31 DIAGNOSIS — F17210 Nicotine dependence, cigarettes, uncomplicated: Secondary | ICD-10-CM | POA: Diagnosis not present

## 2017-12-31 DIAGNOSIS — S161XXA Strain of muscle, fascia and tendon at neck level, initial encounter: Secondary | ICD-10-CM | POA: Diagnosis not present

## 2017-12-31 DIAGNOSIS — Y939 Activity, unspecified: Secondary | ICD-10-CM | POA: Diagnosis not present

## 2017-12-31 DIAGNOSIS — N189 Chronic kidney disease, unspecified: Secondary | ICD-10-CM | POA: Diagnosis not present

## 2017-12-31 DIAGNOSIS — Y92410 Unspecified street and highway as the place of occurrence of the external cause: Secondary | ICD-10-CM | POA: Diagnosis not present

## 2017-12-31 DIAGNOSIS — R51 Headache: Secondary | ICD-10-CM | POA: Diagnosis not present

## 2017-12-31 DIAGNOSIS — Z79899 Other long term (current) drug therapy: Secondary | ICD-10-CM | POA: Diagnosis not present

## 2017-12-31 DIAGNOSIS — S199XXA Unspecified injury of neck, initial encounter: Secondary | ICD-10-CM | POA: Diagnosis present

## 2017-12-31 MED ORDER — HYDROCODONE-ACETAMINOPHEN 5-325 MG PO TABS: 1.0000 | ORAL_TABLET | ORAL | 0 refills | Status: DC | PRN

## 2017-12-31 MED ORDER — METHOCARBAMOL 500 MG PO TABS: 500.0000 mg | ORAL_TABLET | Freq: Two times a day (BID) | ORAL | 0 refills | Status: DC | PRN
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
## 2017-12-31 NOTE — Discharge Instructions (Addendum)
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
## 2018-01-01 ENCOUNTER — Ambulatory Visit (INDEPENDENT_AMBULATORY_CARE_PROVIDER_SITE_OTHER): Payer: Non-veteran care | Admitting: Cardiology

## 2018-01-01 ENCOUNTER — Encounter: Payer: Self-pay | Admitting: Cardiology

## 2018-01-01 VITALS — BP 140/64 | HR 62 | Ht 70.0 in | Wt 183.8 lb

## 2018-01-01 DIAGNOSIS — I10 Essential (primary) hypertension: Secondary | ICD-10-CM | POA: Diagnosis not present

## 2018-01-01 DIAGNOSIS — I2510 Atherosclerotic heart disease of native coronary artery without angina pectoris: Secondary | ICD-10-CM

## 2018-01-01 DIAGNOSIS — F1410 Cocaine abuse, uncomplicated: Secondary | ICD-10-CM

## 2018-01-01 DIAGNOSIS — E7800 Pure hypercholesterolemia, unspecified: Secondary | ICD-10-CM

## 2018-01-01 MED ORDER — LABETALOL HCL 100 MG PO TABS: 200.0000 mg | ORAL_TABLET | ORAL | 3 refills | Status: DC

## 2018-01-01 MED ORDER — AMLODIPINE BESYLATE 10 MG PO TABS: 10.0000 mg | ORAL_TABLET | Freq: Every day | ORAL | 3 refills | Status: DC

## 2018-01-01 MED ORDER — ATORVASTATIN CALCIUM 40 MG PO TABS: 40.0000 mg | ORAL_TABLET | Freq: Every day | ORAL | 3 refills | Status: DC

## 2018-01-01 MED ORDER — HYDRALAZINE HCL 25 MG PO TABS: 50.0000 mg | ORAL_TABLET | ORAL | 3 refills | Status: DC

## 2018-01-01 MED ORDER — ISOSORBIDE MONONITRATE ER 30 MG PO TB24: 30.0000 mg | ORAL_TABLET | Freq: Every day | ORAL | 3 refills | Status: DC

## 2018-01-01 NOTE — Patient Instructions (Signed)
Medication Instructions:   RESTART ATORVASTATIN 40 MG ONCE DAILY  INCREASE LABETALOL TO 100 MG TWICE DAILY  INCREASE HYDRALAZINE TO 25 MG TWICE DAILY  Labwork:  Your physician recommends that you return for lab work in: [place]:  Your physician wants you to follow-up in: [place] will receive a reminder letter in the mail two months in advance. If you don't receive a letter, please call our office to schedule the follow-up appointment.   If you need a refill on your cardiac medications before your next appointment, please call your pharmacy.

## 2018-02-23 ENCOUNTER — Encounter: Payer: Self-pay | Admitting: *Deleted

## 2020-01-25 DIAGNOSIS — R809 Proteinuria, unspecified: Secondary | ICD-10-CM | POA: Insufficient documentation

## 2020-06-08 ENCOUNTER — Other Ambulatory Visit: Payer: Self-pay

## 2020-06-08 DIAGNOSIS — W2209XA Striking against other stationary object, initial encounter: Secondary | ICD-10-CM | POA: Diagnosis not present

## 2020-06-08 DIAGNOSIS — S93601A Unspecified sprain of right foot, initial encounter: Secondary | ICD-10-CM | POA: Insufficient documentation

## 2020-06-08 DIAGNOSIS — Z79899 Other long term (current) drug therapy: Secondary | ICD-10-CM | POA: Insufficient documentation

## 2020-06-08 DIAGNOSIS — I129 Hypertensive chronic kidney disease with stage 1 through stage 4 chronic kidney disease, or unspecified chronic kidney disease: Secondary | ICD-10-CM | POA: Diagnosis not present

## 2020-06-08 DIAGNOSIS — M79671 Pain in right foot: Secondary | ICD-10-CM | POA: Diagnosis present

## 2020-06-08 DIAGNOSIS — Z7982 Long term (current) use of aspirin: Secondary | ICD-10-CM | POA: Insufficient documentation

## 2020-06-08 DIAGNOSIS — N189 Chronic kidney disease, unspecified: Secondary | ICD-10-CM | POA: Diagnosis not present

## 2020-06-08 DIAGNOSIS — F17210 Nicotine dependence, cigarettes, uncomplicated: Secondary | ICD-10-CM | POA: Diagnosis not present

## 2020-06-09 ENCOUNTER — Emergency Department (HOSPITAL_COMMUNITY)
Admission: EM | Admit: 2020-06-09 | Discharge: 2020-06-09 | Disposition: A | Discharge status: Home / Self Care | Payer: No Typology Code available for payment source | Attending: Emergency Medicine | Admitting: Emergency Medicine

## 2020-06-09 ENCOUNTER — Other Ambulatory Visit: Payer: Self-pay

## 2020-06-09 ENCOUNTER — Emergency Department (HOSPITAL_COMMUNITY): Payer: No Typology Code available for payment source

## 2020-06-09 ENCOUNTER — Encounter (HOSPITAL_COMMUNITY): Payer: Self-pay

## 2020-06-09 DIAGNOSIS — S93601A Unspecified sprain of right foot, initial encounter: Secondary | ICD-10-CM

## 2020-06-09 NOTE — ED Triage Notes (Signed)
Pt reports he stumped his toe going to the bathroom 2 days ago.

## 2020-06-09 NOTE — ED Provider Notes (Signed)
[facility] DEPT Provider Note   CSN: [id number] Arrival date & time: 06/08/20  2342     History Chief Complaint  Patient presents with  . Foot Pain    Ronald Reeves is a 64 y.o. male.  Patient presents to the emergency department with a chief complaint of right foot pain.  He states that he stubbed his foot against a chair the other day.  He states that he has been taking ibuprofen for the pain.  States that he has had some swelling.  Reports that the pain persists.  He wants to ensure that nothing is broken.  He denies any other problems tonight.  Symptoms are worsened with palpation and movement.  The history is provided by the patient. No language interpreter was used.       Past Medical History:  Diagnosis Date  . Back pain   . Bronchitis   . GERD (gastroesophageal reflux disease)   . Hypertension     Patient Active Problem List   Diagnosis Date Noted  . Chest pain 08/20/2017  . Chronic back pain 08/20/2017  . Peripheral neuropathy 05/30/2013  . Chronic kidney disease 05/30/2013  . Essential hypertension, malignant 05/30/2013  . Cervical radiculopathy 05/30/2013  . Acute exacerbation of chronic low back pain 05/30/2013  . Dental caries 05/30/2013    Past Surgical History:  Procedure Laterality Date  . BACK SURGERY         Family History  Problem Relation Age of Onset  . Heart disease Mother   . Heart attack Sister 49    Social History   Tobacco Use  . Smoking status: Current Every Day Smoker    Packs/day: 0.50    Types: Cigarettes  . Smokeless tobacco: Never Used  Vaping Use  . Vaping Use: Never used  Substance Use Topics  . Alcohol use: Yes    Alcohol/week: 7.0 standard drinks    Types: 7 Cans of beer per week    Comment: 24 oz can of beer per day  . Drug use: Yes    Types: Cocaine    Comment: pt states he uses crack cocaine at least 2xweek.     Home Medications Prior to Admission medications   Medication  Sig Start Date End Date Taking? Authorizing Provider  amLODipine (NORVASC) 10 MG tablet Take 1 tablet (10 mg total) by mouth daily. 01/01/18   Lelon Perla, MD  aspirin EC 81 MG tablet Take 81 mg by mouth daily.    [provider]  atorvastatin (LIPITOR) 40 MG tablet Take 1 tablet (40 mg total) by mouth daily. 01/01/18 04/01/18  Lelon Perla, MD  gabapentin (NEURONTIN) 300 MG capsule Take 300 mg by mouth 3 (three) times daily. For moods and anxiety-may also help with chronic pain    [provider]  hydrALAZINE (APRESOLINE) 25 MG tablet Take 2 tablets (50 mg total) by mouth every morning. 01/01/18   Lelon Perla, MD  HYDROcodone-acetaminophen (NORCO) 5-325 MG tablet Take 1-2 tablets by mouth every 4 (four) hours as needed for severe pain. 12/31/17   Margarita Mail, PA-C  isosorbide mononitrate (IMDUR) 30 MG 24 hr tablet Take 1 tablet (30 mg total) by mouth daily. 01/01/18   Lelon Perla, MD  labetalol (NORMODYNE) 100 MG tablet Take 2 tablets (200 mg total) by mouth every morning. 01/01/18   Lelon Perla, MD  methocarbamol (ROBAXIN) 500 MG tablet Take 1 tablet (500 mg total) by mouth 2 (two) times daily  as needed for muscle spasms. 12/31/17   Margarita Mail, PA-C    Allergies    Patient has no known allergies.  Review of Systems   Review of Systems  Constitutional: Negative for chills and fever.  Musculoskeletal: Positive for arthralgias and joint swelling. Negative for gait problem.    Physical Exam Updated Vital Signs BP (!) 127/99 (BP Location: Left Arm)   Pulse 88   Temp 98.4 F (36.9 C) (Oral)   Resp 18   Ht 5\' 10"  (1.778 m)   Wt 83.9 kg   SpO2 97%   BMI 26.54 kg/m   Physical Exam Nursing note and vitals reviewed.  Constitutional: Pt appears well-developed and well-nourished. No distress.  HENT:  Head: Normocephalic and atraumatic.  Eyes: Conjunctivae are normal.  Neck: Normal range of motion.  Cardiovascular: Normal rate, regular  rhythm. Intact distal pulses.   Capillary refill < 3 sec.  Pulmonary/Chest: Effort normal and breath sounds normal.  Musculoskeletal:  Right foot Pt exhibits mild swelling and tenderness to palpation anteriorly, no bony abnormality or deformity.   ROM: 5/5  Strength: 5/5 Neurological: Pt  is alert. Coordination normal.  Sensation: 5/5 Skin: Skin is warm and dry. Pt is not diaphoretic.  No evidence of open wound or skin tenting Psychiatric: Pt has a normal mood and affect.    ED Results / Procedures / Treatments   Labs (all labs ordered are listed, but only abnormal results are displayed) Labs Reviewed - No data to display  EKG None  Radiology DG Foot Complete Right  Result Date: 06/09/2020 CLINICAL DATA:  Foot pain EXAM: RIGHT FOOT COMPLETE - 3+ VIEW COMPARISON:  None. FINDINGS: Three view radiograph right foot demonstrates normal alignment. No fracture or dislocation. Mild midfoot degenerative arthritis. Vascular calcifications are noted. There is moderate soft tissue swelling of the right forefoot. IMPRESSION: Soft tissue swelling.  No fracture or dislocation. Electronically Signed   By: Fidela Salisbury MD   On: 06/09/2020 00:28    Procedures Procedures (including critical care time)  Medications Ordered in ED Medications - No data to display  ED Course  I have reviewed the triage vital signs and the nursing notes.  Pertinent labs & imaging results that were available during my care of the patient were reviewed by me and considered in my medical decision making (see chart for details).    MDM Rules/Calculators/A&P                          Patient presents with injury to right foot.  DDx includes, fracture, strain, or sprain.  Consultants: None  Plain films reveal no fracture dislocation.  Pt advised to follow up with PCP and/or orthopedics. Patient given postop shoe while in ED, conservative therapy such as RICE recommended and discussed.   Patient will be  discharged home & is agreeable with above plan. Returns precautions discussed. Pt appears safe for discharge.  Final Clinical Impression(s) / ED Diagnoses Final diagnoses:  Sprain of right foot, initial encounter    Rx / DC Orders ED Discharge Orders    None       Montine Circle, PA-C 06/09/20 [id number]    Orpah Greek, MD 06/09/20 [phone]

## 2020-06-09 NOTE — ED Notes (Signed)
Pt ambulated from triage to Room 18 gait steady. No assistance

## 2020-06-21 ENCOUNTER — Encounter: Payer: Self-pay | Admitting: Cardiology

## 2020-06-21 ENCOUNTER — Telehealth: Payer: Self-pay | Admitting: Cardiology

## 2020-06-21 NOTE — Telephone Encounter (Signed)
  Recall expunge letter sent 

## 2020-07-17 ENCOUNTER — Emergency Department (HOSPITAL_COMMUNITY): Payer: No Typology Code available for payment source

## 2020-07-17 ENCOUNTER — Inpatient Hospital Stay (HOSPITAL_COMMUNITY)
Admission: EM | Admit: 2020-07-17 | Discharge: 2020-07-18 | DRG: 683 | Discharge status: Against Medical Advice | Payer: No Typology Code available for payment source | Attending: Internal Medicine | Admitting: Internal Medicine

## 2020-07-17 ENCOUNTER — Other Ambulatory Visit: Payer: Self-pay

## 2020-07-17 DIAGNOSIS — N179 Acute kidney failure, unspecified: Secondary | ICD-10-CM | POA: Diagnosis present

## 2020-07-17 DIAGNOSIS — R531 Weakness: Secondary | ICD-10-CM

## 2020-07-17 DIAGNOSIS — Z8249 Family history of ischemic heart disease and other diseases of the circulatory system: Secondary | ICD-10-CM

## 2020-07-17 DIAGNOSIS — I10 Essential (primary) hypertension: Secondary | ICD-10-CM | POA: Diagnosis present

## 2020-07-17 DIAGNOSIS — Z79899 Other long term (current) drug therapy: Secondary | ICD-10-CM

## 2020-07-17 DIAGNOSIS — K219 Gastro-esophageal reflux disease without esophagitis: Secondary | ICD-10-CM | POA: Diagnosis present

## 2020-07-17 DIAGNOSIS — N184 Chronic kidney disease, stage 4 (severe): Secondary | ICD-10-CM | POA: Diagnosis present

## 2020-07-17 DIAGNOSIS — R319 Hematuria, unspecified: Secondary | ICD-10-CM | POA: Diagnosis present

## 2020-07-17 DIAGNOSIS — I129 Hypertensive chronic kidney disease with stage 1 through stage 4 chronic kidney disease, or unspecified chronic kidney disease: Secondary | ICD-10-CM | POA: Diagnosis present

## 2020-07-17 DIAGNOSIS — Z20822 Contact with and (suspected) exposure to covid-19: Secondary | ICD-10-CM | POA: Diagnosis present

## 2020-07-17 DIAGNOSIS — R197 Diarrhea, unspecified: Secondary | ICD-10-CM

## 2020-07-17 DIAGNOSIS — D631 Anemia in chronic kidney disease: Secondary | ICD-10-CM | POA: Diagnosis present

## 2020-07-17 DIAGNOSIS — Z7982 Long term (current) use of aspirin: Secondary | ICD-10-CM

## 2020-07-17 DIAGNOSIS — R0602 Shortness of breath: Secondary | ICD-10-CM | POA: Diagnosis present

## 2020-07-17 DIAGNOSIS — D6959 Other secondary thrombocytopenia: Secondary | ICD-10-CM | POA: Diagnosis present

## 2020-07-17 DIAGNOSIS — D638 Anemia in other chronic diseases classified elsewhere: Secondary | ICD-10-CM | POA: Diagnosis present

## 2020-07-17 DIAGNOSIS — E785 Hyperlipidemia, unspecified: Secondary | ICD-10-CM | POA: Diagnosis present

## 2020-07-17 DIAGNOSIS — D649 Anemia, unspecified: Secondary | ICD-10-CM | POA: Diagnosis present

## 2020-07-17 DIAGNOSIS — F17210 Nicotine dependence, cigarettes, uncomplicated: Secondary | ICD-10-CM | POA: Diagnosis present

## 2020-07-17 DIAGNOSIS — K921 Melena: Secondary | ICD-10-CM | POA: Diagnosis present

## 2020-07-17 LAB — CBC
HCT: 34.9 % — ABNORMAL LOW (ref 39.0–52.0)
Hemoglobin: 10.9 g/dL — ABNORMAL LOW (ref 13.0–17.0)
MCH: 26.8 pg (ref 26.0–34.0)
MCHC: 31.2 g/dL (ref 30.0–36.0)
MCV: 85.7 fL (ref 80.0–100.0)
Platelets: 146 10*3/uL — ABNORMAL LOW (ref 150–400)
RBC: 4.07 MIL/uL — ABNORMAL LOW (ref 4.22–5.81)
RDW: 14.3 % (ref 11.5–15.5)
WBC: 7.7 10*3/uL (ref 4.0–10.5)
nRBC: 0.3 % — ABNORMAL HIGH (ref 0.0–0.2)

## 2020-07-17 LAB — BASIC METABOLIC PANEL
Anion gap: 11 (ref 5–15)
BUN: 40 mg/dL — ABNORMAL HIGH (ref 8–23)
CO2: 22 mmol/L (ref 22–32)
Calcium: 8.6 mg/dL — ABNORMAL LOW (ref 8.9–10.3)
Chloride: 99 mmol/L (ref 98–111)
Creatinine, Ser: 4.8 mg/dL — ABNORMAL HIGH (ref 0.61–1.24)
GFR, Estimated: 13 mL/min — ABNORMAL LOW (ref >60)
Glucose, Bld: 138 mg/dL — ABNORMAL HIGH (ref 70–99)
Potassium: 3.8 mmol/L (ref 3.5–5.1)
Sodium: 132 mmol/L — ABNORMAL LOW (ref 135–145)

## 2020-07-17 LAB — BRAIN NATRIURETIC PEPTIDE: B Natriuretic Peptide: 243.9 pg/mL — ABNORMAL HIGH (ref 0.0–100.0)

## 2020-07-17 LAB — RESP PANEL BY RT-PCR (FLU A&B, COVID) ARPGX2
Influenza A by PCR: NEGATIVE
Influenza B by PCR: NEGATIVE
SARS Coronavirus 2 by RT PCR: NEGATIVE

## 2020-07-17 LAB — TROPONIN I (HIGH SENSITIVITY): Troponin I (High Sensitivity): 52 ng/L — ABNORMAL HIGH (ref <18)

## 2020-07-17 MED ORDER — LACTATED RINGERS IV BOLUS
1000.0000 mL | Freq: Once | INTRAVENOUS | Status: AC
  Administered 2020-07-17: 1000 mL via INTRAVENOUS

## 2020-07-17 NOTE — ED Provider Notes (Signed)
[facility]ERGENCY DEPARTMENT Provider Note   CSN: [id number] Arrival date & time: 07/17/20  1255     History Chief Complaint  Patient presents with  . Weakness  . Shortness of Breath    Ronald Reeves is a 65 y.o. male.  The history is provided by the patient. No language interpreter was used.  Weakness Severity:  Moderate Onset quality:  Gradual Timing:  Constant Progression:  Worsening Chronicity:  New Relieved by:  Nothing Worsened by:  Nothing Ineffective treatments:  None tried Associated symptoms: shortness of breath   Shortness of Breath Pt complains of cough and shortness of breath  Pt reports he has had diarrhea several times     Past Medical History:  Diagnosis Date  . Back pain   . Bronchitis   . GERD (gastroesophageal reflux disease)   . Hypertension     Patient Active Problem List   Diagnosis Date Noted  . Chest pain 08/20/2017  . Chronic back pain 08/20/2017  . Peripheral neuropathy 05/30/2013  . Chronic kidney disease 05/30/2013  . Essential hypertension, malignant 05/30/2013  . Cervical radiculopathy 05/30/2013  . Acute exacerbation of chronic low back pain 05/30/2013  . Dental caries 05/30/2013    Past Surgical History:  Procedure Laterality Date  . BACK SURGERY         Family History  Problem Relation Age of Onset  . Heart disease Mother   . Heart attack Sister 82    Social History   Tobacco Use  . Smoking status: Current Every Day Smoker    Packs/day: 0.50    Types: Cigarettes  . Smokeless tobacco: Never Used  Vaping Use  . Vaping Use: Never used  Substance Use Topics  . Alcohol use: Yes    Alcohol/week: 7.0 standard drinks    Types: 7 Cans of beer per week    Comment: 24 oz can of beer per day  . Drug use: Yes    Types: Cocaine    Comment: pt states he uses crack cocaine at least 2xweek.     Home Medications Prior to Admission medications   Medication Sig Start Date End Date Taking? Authorizing  Provider  amLODipine (NORVASC) 10 MG tablet Take 1 tablet (10 mg total) by mouth daily. 01/01/18   Lelon Perla, MD  aspirin EC 81 MG tablet Take 81 mg by mouth daily.    [provider]  atorvastatin (LIPITOR) 40 MG tablet Take 1 tablet (40 mg total) by mouth daily. 01/01/18 04/01/18  Lelon Perla, MD  gabapentin (NEURONTIN) 300 MG capsule Take 300 mg by mouth 3 (three) times daily. For moods and anxiety-may also help with chronic pain    [provider]  hydrALAZINE (APRESOLINE) 25 MG tablet Take 2 tablets (50 mg total) by mouth every morning. 01/01/18   Lelon Perla, MD  HYDROcodone-acetaminophen (NORCO) 5-325 MG tablet Take 1-2 tablets by mouth every 4 (four) hours as needed for severe pain. 12/31/17   Margarita Mail, PA-C  isosorbide mononitrate (IMDUR) 30 MG 24 hr tablet Take 1 tablet (30 mg total) by mouth daily. 01/01/18   Lelon Perla, MD  labetalol (NORMODYNE) 100 MG tablet Take 2 tablets (200 mg total) by mouth every morning. 01/01/18   [name], Denice Bors, MD  methocarbamol (ROBAXIN) 500 MG tablet Take 1 tablet (500 mg total) by mouth 2 (two) times daily as needed for muscle spasms. 12/31/17   Margarita Mail, PA-C    Allergies  Patient has no known allergies.  Review of Systems   Review of Systems  Respiratory: Positive for shortness of breath.   Neurological: Positive for weakness.  All other systems reviewed and are negative.   Physical Exam Updated Vital Signs BP (!) 166/71   Pulse 84   Temp 98.4 F (36.9 C) (Oral)   Resp (!) 24   SpO2 97%   Physical Exam Vitals and nursing note reviewed.  Constitutional:      Appearance: He is well-developed.  HENT:     Head: Normocephalic.  Cardiovascular:     Rate and Rhythm: Normal rate and regular rhythm.  Pulmonary:     Effort: Pulmonary effort is normal.     Breath sounds: No decreased breath sounds.  Chest:     Chest wall: No mass.  Abdominal:     General: There is no distension.   Musculoskeletal:        General: Normal range of motion.     Cervical back: Normal range of motion.  Skin:    General: Skin is warm.  Neurological:     Mental Status: He is alert and oriented to person, place, and time.     ED Results / Procedures / Treatments   Labs (all labs ordered are listed, but only abnormal results are displayed) Labs Reviewed  BASIC METABOLIC PANEL - Abnormal; Notable for the following components:      Result Value   Sodium 132 (*)    Glucose, Bld 138 (*)    BUN 40 (*)    Creatinine, Ser 4.80 (*)    Calcium 8.6 (*)    GFR, Estimated 13 (*)    All other components within normal limits  CBC - Abnormal; Notable for the following components:   RBC 4.07 (*)    Hemoglobin 10.9 (*)    HCT 34.9 (*)    Platelets 146 (*)    nRBC 0.3 (*)    All other components within normal limits  RESP PANEL BY RT-PCR (FLU A&B, COVID) ARPGX2  URINALYSIS, ROUTINE W REFLEX MICROSCOPIC  BRAIN NATRIURETIC PEPTIDE  CBG MONITORING, ED  TROPONIN I (HIGH SENSITIVITY)    EKG EKG Interpretation  Date/Time:  Tuesday July 17 2020 13:26:09 EST Ventricular Rate:  74 PR Interval:  172 QRS Duration: 90 QT Interval:  392 QTC Calculation: 435 R Axis:   32 Text Interpretation: Normal sinus rhythm Right atrial enlargement Moderate voltage criteria for LVH, may be normal variant ( Sokolow-Lyon , Cornell product ) Anteroseptal infarct , age undetermined Abnormal ECG No significant change since prior 1/19 Confirmed by Aletta Edouard [phone]) on 07/17/2020 7:14:52 PM   Radiology DG Chest 2 View  Result Date: 07/17/2020 CLINICAL DATA:  Chest pain, shortness of breath EXAM: CHEST - 2 VIEW COMPARISON:  08/20/2017 FINDINGS: The heart size and mediastinal contours are within normal limits. No focal airspace consolidation, pleural effusion, or pneumothorax. Anterior bridging osteophytes throughout the thoracic spine. IMPRESSION: No active cardiopulmonary disease. Electronically Signed    By: Davina Poke D.O.   On: 07/17/2020 14:51    Procedures Procedures (including critical care time)  Medications Ordered in ED Medications - No data to display  ED Course  I have reviewed the triage vital signs and the nursing notes.  Pertinent labs & imaging results that were available during my care of the patient were reviewed by me and considered in my medical decision making (see chart for details).  Clinical Course as of Jul 17 2129  Tue Jul 17, 6024  3242 64 year old male with complaint of poor intake weakness and diarrhea going on for over a week.  Fairly nontoxic-appearing.  Labs showing a new AKI.  Will need admission for further management of this.   [MB]    Clinical Course User Index [MB] Hayden Rasmussen, MD   MDM Rules/Calculators/A&P                          MDM:  Pt's care turned over to Dr. Melina Copa at 10 pm.  Troponin pending.  Final Clinical Impression(s) / ED Diagnoses Final diagnoses:  Weakness  Diarrhea, unspecified type    Rx / DC Orders ED Discharge Orders    None       Sidney Ace 07/17/20 2212    Hayden Rasmussen, MD 07/18/20 1210

## 2020-07-17 NOTE — ED Triage Notes (Signed)
Pt reports shob, weakness, and dizziness since Friday. Endorses abdominal pain a few days ago, but none today.

## 2020-07-18 ENCOUNTER — Encounter (HOSPITAL_COMMUNITY): Payer: Self-pay | Admitting: Internal Medicine

## 2020-07-18 ENCOUNTER — Observation Stay (HOSPITAL_COMMUNITY): Payer: No Typology Code available for payment source

## 2020-07-18 DIAGNOSIS — I10 Essential (primary) hypertension: Secondary | ICD-10-CM

## 2020-07-18 DIAGNOSIS — R197 Diarrhea, unspecified: Secondary | ICD-10-CM | POA: Diagnosis present

## 2020-07-18 DIAGNOSIS — I129 Hypertensive chronic kidney disease with stage 1 through stage 4 chronic kidney disease, or unspecified chronic kidney disease: Secondary | ICD-10-CM | POA: Diagnosis present

## 2020-07-18 DIAGNOSIS — Z8249 Family history of ischemic heart disease and other diseases of the circulatory system: Secondary | ICD-10-CM | POA: Diagnosis not present

## 2020-07-18 DIAGNOSIS — N179 Acute kidney failure, unspecified: Principal | ICD-10-CM

## 2020-07-18 DIAGNOSIS — D638 Anemia in other chronic diseases classified elsewhere: Secondary | ICD-10-CM | POA: Diagnosis present

## 2020-07-18 DIAGNOSIS — F17210 Nicotine dependence, cigarettes, uncomplicated: Secondary | ICD-10-CM | POA: Diagnosis present

## 2020-07-18 DIAGNOSIS — Z79899 Other long term (current) drug therapy: Secondary | ICD-10-CM | POA: Diagnosis not present

## 2020-07-18 DIAGNOSIS — R319 Hematuria, unspecified: Secondary | ICD-10-CM | POA: Diagnosis present

## 2020-07-18 DIAGNOSIS — Z7982 Long term (current) use of aspirin: Secondary | ICD-10-CM | POA: Diagnosis not present

## 2020-07-18 DIAGNOSIS — E785 Hyperlipidemia, unspecified: Secondary | ICD-10-CM | POA: Diagnosis present

## 2020-07-18 DIAGNOSIS — K921 Melena: Secondary | ICD-10-CM | POA: Diagnosis present

## 2020-07-18 DIAGNOSIS — D649 Anemia, unspecified: Secondary | ICD-10-CM | POA: Diagnosis present

## 2020-07-18 DIAGNOSIS — N184 Chronic kidney disease, stage 4 (severe): Secondary | ICD-10-CM | POA: Diagnosis present

## 2020-07-18 DIAGNOSIS — D631 Anemia in chronic kidney disease: Secondary | ICD-10-CM | POA: Diagnosis present

## 2020-07-18 DIAGNOSIS — R0602 Shortness of breath: Secondary | ICD-10-CM | POA: Diagnosis present

## 2020-07-18 DIAGNOSIS — Z20822 Contact with and (suspected) exposure to covid-19: Secondary | ICD-10-CM | POA: Diagnosis present

## 2020-07-18 DIAGNOSIS — D6959 Other secondary thrombocytopenia: Secondary | ICD-10-CM | POA: Diagnosis present

## 2020-07-18 DIAGNOSIS — K219 Gastro-esophageal reflux disease without esophagitis: Secondary | ICD-10-CM | POA: Diagnosis present

## 2020-07-18 LAB — TROPONIN I (HIGH SENSITIVITY)
Troponin I (High Sensitivity): 53 ng/L — ABNORMAL HIGH (ref <18)
Troponin I (High Sensitivity): 57 ng/L — ABNORMAL HIGH (ref <18)
Troponin I (High Sensitivity): 39 ng/L — ABNORMAL HIGH (ref <18)

## 2020-07-18 LAB — URINALYSIS, ROUTINE W REFLEX MICROSCOPIC
Bilirubin Urine: NEGATIVE
Glucose, UA: NEGATIVE mg/dL
Ketones, ur: NEGATIVE mg/dL
Nitrite: NEGATIVE
Protein, ur: 100 mg/dL — AB
Specific Gravity, Urine: 1.008 (ref 1.005–1.030)
WBC, UA: >50 WBC/hpf — ABNORMAL HIGH (ref 0–5)
pH: 6 (ref 5.0–8.0)

## 2020-07-18 LAB — HIV ANTIBODY (ROUTINE TESTING W REFLEX): HIV Screen 4th Generation wRfx: NONREACTIVE

## 2020-07-18 MED ORDER — ADULT MULTIVITAMIN W/MINERALS CH: 1.0000 | ORAL_TABLET | Freq: Every day | ORAL | Status: DC

## 2020-07-18 MED ORDER — AMLODIPINE BESYLATE 5 MG PO TABS: 10.0000 mg | ORAL_TABLET | Freq: Every day | ORAL | Status: DC

## 2020-07-18 MED ORDER — ACETAMINOPHEN 650 MG RE SUPP: 650.0000 mg | Freq: Four times a day (QID) | RECTAL | Status: DC | PRN

## 2020-07-18 MED ORDER — FOLIC ACID 1 MG PO TABS: 1.0000 mg | ORAL_TABLET | Freq: Every day | ORAL | Status: DC

## 2020-07-18 MED ORDER — THIAMINE HCL 100 MG/ML IJ SOLN: 100.0000 mg | Freq: Every day | INTRAMUSCULAR | Status: DC

## 2020-07-18 MED ORDER — THIAMINE HCL 100 MG PO TABS: 100.0000 mg | ORAL_TABLET | Freq: Every day | ORAL | Status: DC

## 2020-07-18 MED ORDER — LORAZEPAM 2 MG/ML IJ SOLN: 1.0000 mg | INTRAMUSCULAR | Status: DC | PRN

## 2020-07-18 MED ORDER — LABETALOL HCL 200 MG PO TABS: 200.0000 mg | ORAL_TABLET | Freq: Every day | ORAL | Status: DC

## 2020-07-18 MED ORDER — HYDRALAZINE HCL 50 MG PO TABS: 50.0000 mg | ORAL_TABLET | Freq: Every day | ORAL | Status: DC

## 2020-07-18 MED ORDER — SODIUM CHLORIDE 0.9 % IV SOLN: INTRAVENOUS | Status: DC

## 2020-07-18 MED ORDER — ATORVASTATIN CALCIUM 10 MG PO TABS: 40.0000 mg | ORAL_TABLET | Freq: Every day | ORAL | Status: DC

## 2020-07-18 MED ORDER — ACETAMINOPHEN 325 MG PO TABS: 650.0000 mg | ORAL_TABLET | Freq: Four times a day (QID) | ORAL | Status: DC | PRN

## 2020-07-18 MED ORDER — LORAZEPAM 1 MG PO TABS: 1.0000 mg | ORAL_TABLET | ORAL | Status: DC | PRN

## 2020-07-18 MED ORDER — ISOSORBIDE MONONITRATE ER 30 MG PO TB24: 30.0000 mg | ORAL_TABLET | Freq: Every day | ORAL | Status: DC

## 2020-07-18 NOTE — ED Notes (Signed)
Daughter picked patient up, MD made aware, left AMA, will follow up with [facility] hospital.

## 2020-07-18 NOTE — H&P (Addendum)
History and Physical    [place] Dani BCW:[id number] DOB: 22-Aug-1955 DOA: 07/17/2020  PCP: Gerome Sam, MD  Patient coming from: Home.  Chief Complaint: Weakness cough and diarrhea.  HPI: Laurie Lovejoy is a 64 y.o. male with history of hypertension, chronic kidney disease stage IV, anemia who had a recent prostate biopsy about 5 days ago 2 days later patient has been having some diarrhea weakness and cough with sore throat-like symptoms.  The symptoms have been ongoing for last 2 days with multiple episodes of diarrhea.  Patient did receive some antibiotics for the prostate biopsy day prior and on the day and after the biopsy.  He noticed some blood in the urine and stools.  Urine feeling weak and tired and feeling dehydrated poor oral intake.  Had no diarrhea yesterday.  ED Course: In the ER patient labs show creatinine of 4.8 which increased from 2.8 in June 2021 per Care Everywhere.  And hemoglobin around 10.9 which decreased from 11.4 in June 2021.  EKG shows LVH high sensitive troponin has been stable around 52 and 53.  BNP 243 Covid test negative patient started on fluids urine is still pending.  Patient admitted for acute renal failure.  Review of Systems: As per HPI, rest all negative.   Past Medical History:  Diagnosis Date  . Back pain   . Bronchitis   . GERD (gastroesophageal reflux disease)   . Hypertension     Past Surgical History:  Procedure Laterality Date  . BACK SURGERY       reports that he has been smoking cigarettes. He has been smoking about 0.50 packs per day. He has never used smokeless tobacco. He reports current alcohol use of about 7.0 standard drinks of alcohol per week. He reports current drug use. Drug: Cocaine.  No Known Allergies  Family History  Problem Relation Age of Onset  . Heart disease Mother   . Heart attack Sister 82    Prior to Admission medications   Medication Sig Start Date End Date Taking? Authorizing Provider  amLODipine  (NORVASC) 10 MG tablet Take 1 tablet (10 mg total) by mouth daily. 01/01/18   Lelon Perla, MD  aspirin EC 81 MG tablet Take 81 mg by mouth daily.    [provider]  atorvastatin (LIPITOR) 40 MG tablet Take 1 tablet (40 mg total) by mouth daily. 01/01/18 04/01/18  Lelon Perla, MD  gabapentin (NEURONTIN) 300 MG capsule Take 300 mg by mouth 3 (three) times daily. For moods and anxiety-may also help with chronic pain    [provider]  hydrALAZINE (APRESOLINE) 25 MG tablet Take 2 tablets (50 mg total) by mouth every morning. 01/01/18   Lelon Perla, MD  HYDROcodone-acetaminophen (NORCO) 5-325 MG tablet Take 1-2 tablets by mouth every 4 (four) hours as needed for severe pain. 12/31/17   Margarita Mail, PA-C  isosorbide mononitrate (IMDUR) 30 MG 24 hr tablet Take 1 tablet (30 mg total) by mouth daily. 01/01/18   Lelon Perla, MD  labetalol (NORMODYNE) 100 MG tablet Take 2 tablets (200 mg total) by mouth every morning. 01/01/18   [name], Denice Bors, MD  methocarbamol (ROBAXIN) 500 MG tablet Take 1 tablet (500 mg total) by mouth 2 (two) times daily as needed for muscle spasms. 12/31/17   Margarita Mail, PA-C    Physical Exam: Constitutional: Moderately built and nourished. Vitals:   07/18/20 0130 07/18/20 0145 07/18/20 0200 07/18/20 0245  BP: (!) 141/53 (!) 121/57 (!) 143/56 Marland Kitchen)  150/60  Pulse: 82 85 74 72  Resp: (!) 24 (!) 22 18 17   Temp:      TempSrc:      SpO2: 97% 98% 97% 97%   Eyes: Anicteric no pallor. ENMT: No discharge from the ears eyes nose or mouth. Neck: No mass felt.  No neck rigidity. Respiratory: No rhonchi or crepitations. Cardiovascular: S1-S2 heard. Abdomen: Soft nontender bowel sounds present. Musculoskeletal: No edema. Skin: No rash. Neurologic: Alert awake oriented to time place and person.  Moves all extremities. Psychiatric: Appears normal.  Normal affect.   Labs on Admission: I have personally reviewed following labs and imaging  studies  CBC: Recent Labs  Lab 07/17/20 1338  WBC 7.7  HGB 10.9*  HCT 34.9*  MCV 85.7  PLT 809*   Basic Metabolic Panel: Recent Labs  Lab 07/17/20 1338  NA 132*  K 3.8  CL 99  CO2 22  GLUCOSE 138*  BUN 40*  CREATININE 4.80*  CALCIUM 8.6*   GFR: CrCl cannot be calculated (Unknown ideal weight.). Liver Function Tests: No results for input(s): AST, ALT, ALKPHOS, BILITOT, PROT, ALBUMIN in the last 168 hours. No results for input(s): LIPASE, AMYLASE in the last 168 hours. No results for input(s): AMMONIA in the last 168 hours. Coagulation Profile: No results for input(s): INR, PROTIME in the last 168 hours. Cardiac Enzymes: No results for input(s): CKTOTAL, CKMB, CKMBINDEX, TROPONINI in the last 168 hours. BNP (last 3 results) No results for input(s): PROBNP in the last 8760 hours. HbA1C: No results for input(s): HGBA1C in the last 72 hours. CBG: No results for input(s): GLUCAP in the last 168 hours. Lipid Profile: No results for input(s): CHOL, HDL, LDLCALC, TRIG, CHOLHDL, LDLDIRECT in the last 72 hours. Thyroid Function Tests: No results for input(s): TSH, T4TOTAL, FREET4, T3FREE, THYROIDAB in the last 72 hours. Anemia Panel: No results for input(s): VITAMINB12, FOLATE, FERRITIN, TIBC, IRON, RETICCTPCT in the last 72 hours. Urine analysis:    Component Value Date/Time   COLORURINE YELLOW 12/09/2015 [place] 12/09/2015 1358   LABSPEC 1.027 12/09/2015 1358   PHURINE 5.5 12/09/2015 1358   GLUCOSEU NEGATIVE 12/09/2015 1358   HGBUR NEGATIVE 12/09/2015 1358   BILIRUBINUR NEGATIVE 12/09/2015 1358   KETONESUR NEGATIVE 12/09/2015 1358   PROTEINUR >300 (A) 12/09/2015 1358   UROBILINOGEN 1.0 05/20/2013 0532   NITRITE NEGATIVE 12/09/2015 1358   LEUKOCYTESUR NEGATIVE 12/09/2015 1358   Sepsis Labs: @LABRCNTIP (procalcitonin:4,lacticidven:4) ) Recent Results (from the past 240 hour(s))  Resp Panel by RT-PCR (Flu A&B, Covid) Nasopharyngeal Swab      Status: None   Collection Time: 07/17/20  1:38 PM   Specimen: Nasopharyngeal Swab; Nasopharyngeal(NP) swabs in vial transport medium  Result Value Ref Range Status   SARS Coronavirus 2 by RT PCR NEGATIVE NEGATIVE Final    Comment: (NOTE) SARS-CoV-2 target nucleic acids are NOT DETECTED.  The SARS-CoV-2 RNA is generally detectable in upper respiratory specimens during the acute phase of infection. The lowest concentration of SARS-CoV-2 viral copies this assay can detect is 138 copies/mL. A negative result does not preclude SARS-Cov-2 infection and should not be used as the sole basis for treatment or other patient management decisions. A negative result may occur with  improper specimen collection/handling, submission of specimen other than nasopharyngeal swab, presence of viral mutation(s) within the areas targeted by this assay, and inadequate number of viral copies(<138 copies/mL). A negative result must be combined with clinical observations, patient history, and epidemiological information. The expected result is  Negative.  Fact Sheet for Patients:  EntrepreneurPulse.com.au  Fact Sheet for Healthcare Providers:  IncredibleEmployment.be  This test is no t yet approved or cleared by the [place] FDA and  has been authorized for detection and/or diagnosis of SARS-CoV-2 by FDA under an Emergency Use Authorization (EUA). This EUA will remain  in effect (meaning this test can be used) for the duration of the COVID-19 declaration under Section 564(b)(1) of the Act, 21 U.S.C.section 360bbb-3(b)(1), unless the authorization is terminated  or revoked sooner.       Influenza A by PCR NEGATIVE NEGATIVE Final   Influenza B by PCR NEGATIVE NEGATIVE Final    Comment: (NOTE) The Xpert Xpress SARS-CoV-2/FLU/RSV plus assay is intended as an aid in the diagnosis of influenza from Nasopharyngeal swab specimens and should not be used as a sole basis  for treatment. Nasal washings and aspirates are unacceptable for Xpert Xpress SARS-CoV-2/FLU/RSV testing.  Fact Sheet for Patients: EntrepreneurPulse.com.au  Fact Sheet for Healthcare Providers: IncredibleEmployment.be  This test is not yet approved or cleared by the [place] FDA and has been authorized for detection and/or diagnosis of SARS-CoV-2 by FDA under an Emergency Use Authorization (EUA). This EUA will remain in effect (meaning this test can be used) for the duration of the COVID-19 declaration under Section 564(b)(1) of the Act, 21 U.S.C. section 360bbb-3(b)(1), unless the authorization is terminated or revoked.  Performed at [facility], [place] [address] [place], [place] [address]      Radiological Exams on Admission: DG Chest 2 View  Result Date: 07/17/2020 CLINICAL DATA:  Chest pain, shortness of breath EXAM: CHEST - 2 VIEW COMPARISON:  08/20/2017 FINDINGS: The heart size and mediastinal contours are within normal limits. No focal airspace consolidation, pleural effusion, or pneumothorax. Anterior bridging osteophytes throughout the thoracic spine. IMPRESSION: No active cardiopulmonary disease. Electronically Signed   By: Davina Poke D.O.   On: 07/17/2020 14:51    EKG: Independently reviewed.  Normal sinus rhythm LVH right atrial enlargement.  Assessment/Plan Principal Problem:   ARF (acute renal failure) (HCC) Active Problems:   Essential hypertension, malignant   Anemia    1. Acute renal failure on chronic kidney disease stage IV with history of nephrotic range proteinuria being followed at [facility] has had biopsy done previously likely worsened because of recent diarrhea for which we will gently hydrate UA still pending.  Given the patient had recent prostate biopsy and had some pain around the pelvic area will get a CT scan.  Follow intake output metabolic panel. 2. Upper respiratory tract symptoms.  Covid  test is negative.  Patient is afebrile.  Chest x-ray unremarkable.  Patient not hypoxic.  We will continue to monitor. 3. Diarrhea likely precipitated by recent use of antibiotics.  Has not had any diarrhea last 24 hours.  Any further diarrhea will need to check stool studies. 4. Hypertension we will continue home dose of Imdur labetalol and amlodipine. 5. Hyperlipidemia on statins. 6. Anemia likely from renal disease mildly worsened could be from recent prostate biopsy with mild loss of blood.  Follow CBC closely.  Follow CT scan of the abdomen. 7. Thrombocytopenia appears to be chronic and had thrombocytopenia in the labs done in June 2021. 8. Recent prostate biopsy about 5 days ago.  Had noticed some blood in the stool and urine.  Urine is pending.  CT scanning pending.   DVT prophylaxis: SCDs for now until we make sure that patient is not having any further  decline in hemoglobin and also pending CT scan results. Code Status: Full code. Family Communication: Discussed with patient. Disposition Plan: Home. Consults called: None. Admission status: Observation.   Rise Patience MD [facility] Pager [phone].  If 7PM-7AM, please contact night-coverage [email or web address] Password [facility]  07/18/2020, 3:43 AM

## 2020-07-18 NOTE — ED Notes (Signed)
Breakfast ordered 

## 2020-07-18 NOTE — Progress Notes (Signed)
PROGRESS NOTE  Ronald Reeves  DOB: 08-05-1956  PCP: Gerome Sam, MD CZY:[id number]  DOA: 07/17/2020  LOS: 0 days   Chief Complaint  Patient presents with  . Weakness  . Shortness of Breath    Brief narrative: Ronald Reeves is a 64 y.o. male with PMH significant for HTN, CKD 4, chronic anemia GERD, bronchitis, back pain with history of surgery.   On 12/2, patient had prostatic biopsy done by Dr. Gilford Rile at [facility] urology.  Next day, patient started having URI symptoms with productive cough, low-grade fever, sore throat, diarrhea, and weakness.   On 12/7, patient presented to the ED with persistence of the symptoms. In the ED, patient was afebrile, blood pressure was elevated up to 160s. Labs with creatinine elevated to 4.8 which is high compared to 2.8 in June 2021. EKG showed left ventricular hypertrophy.   High sensitive troponin slightly elevated to 50s.  BNP 243.   Patient was started on IV hydration and admitted to hospitalist service.  Subjective: Patient was seen and examined this morning.  Pleasant middle-aged African-American male.  Looks older for his age.  Not in distress.  Diarrhea has stopped.  Last bowel movement was 2 days ago. Chart reviewed. Blood pressure in 140s overnight. No repeat labs this morning.  Assessment/Plan: AKI on CKD 4 -Baseline creatinine 2.8 in June 2021 per [place] with an elevated creatinine of 4.8, likely secondary to fluid loss from diarrhea. -Currently on normal saline at 100 mill per hour.  Continue the same. -Continue to trend creatinine. -Patient has history of nephrotic range proteinuria and follows up at [place].  He reportedly had kidney biopsy done in the past as well. Recent Labs    07/17/20 1338  BUN 40*  CREATININE 4.80*   Diarrhea -Started after the use of 2 doses of antibiotics before and after prostate biopsy. -Diarrhea apparently is already stopped now.  Recent prostate biopsy -Underwent prostate  biopsy 5 days prior to condition.  Complains of pain around the pelvic area.  Pending CT scan.    Hypertension -Home meds include Imdur, hydralazine, amlodipine and labetalol, Lasix -Lasix on hold because of AKI.  Continue others.  Continue to monitor blood pressure.  Hyperlipidemia -Continue statin  Chronic anemia -Probably related to CKD. Recent Labs    07/17/20 1338  HGB 10.9*  MCV 85.7   Chronic thrombocytopenia  Recent Labs  Lab 07/17/20 1338  PLT 146*   Mobility: Encourage ambulation. Code Status:   Code Status: Full Code  Nutritional status: There is no height or weight on file to calculate BMI.     Diet Order            Diet Heart Room service appropriate? Yes; Fluid consistency: Thin  Diet effective now                 DVT prophylaxis: SCDs Start: 07/18/20 0342   Antimicrobials:  None Fluid: Normal saline at 100 mL/h Consultants: None Family Communication:  None at bedside  Status is: Observation  The patient will require care spanning > 2 midnights and should be moved to inpatient because: Needs to stay longer for renal function monitoring and improvement.  Dispo: The patient is from: Home              Anticipated d/c is to: Home              Anticipated d/c date is: 2 to 3 days  Patient currently is not medically stable to d/c.       Infusions:  . sodium chloride 100 mL/hr at 07/18/20 0424    Scheduled Meds: . amLODipine  10 mg Oral Daily  . atorvastatin  40 mg Oral Daily  . hydrALAZINE  50 mg Oral Daily  . isosorbide mononitrate  30 mg Oral Daily  . labetalol  200 mg Oral Daily    Antimicrobials: Anti-infectives (From admission, onward)   None      PRN meds: acetaminophen **OR** acetaminophen   Objective: Vitals:   07/18/20 0615 07/18/20 0815  BP:  (!) 163/77  Pulse: 79 84  Resp:  16  Temp:    SpO2: (!) 89% 100%    Intake/Output Summary (Last 24 hours) at 07/18/2020 0953 Last data filed at 07/18/2020  0716 Gross per 24 hour  Intake --  Output 1200 ml  Net -1200 ml   There were no vitals filed for this visit. Weight change:  There is no height or weight on file to calculate BMI.   Physical Exam: General exam: Pleasant.  Not in physical distress Skin: No rashes, lesions or ulcers. HEENT: Atraumatic, normocephalic, no obvious bleeding Lungs: Clear to auscultation bilaterally CVS: Regular rate and rhythm, no murmur GI/Abd soft, nontender, nondistended, bowel sound present CNS: Alert, awake, oriented x3 Psychiatry: Mood appropriate Extremities: No pedal edema, no calf tenderness  Data Review: I have personally reviewed the laboratory data and studies available.  Recent Labs  Lab 07/17/20 1338  WBC 7.7  HGB 10.9*  HCT 34.9*  MCV 85.7  PLT 146*   Recent Labs  Lab 07/17/20 1338  NA 132*  K 3.8  CL 99  CO2 22  GLUCOSE 138*  BUN 40*  CREATININE 4.80*  CALCIUM 8.6*    F/u labs ordered.  Signed, Terrilee Croak, MD [facility] 07/18/2020

## 2020-07-19 NOTE — Discharge Summary (Signed)
Discharge AGAINST MEDICAL ADVICE  Ronald Reeves IRW:[id number] DOB: 01-Apr-1956 DOA: 07/17/2020  PCP: Gerome Sam, MD  Admit date: 07/17/2020 Discharge date: 07/19/2020 left AMA  Admitted From: Home   Code Status: Prior   Discharge Diagnosis:   Principal Problem:   ARF (acute renal failure) ([place]) Active Problems:   Essential hypertension, malignant   Anemia    History of Present Illness / Brief narrative:  Ronald Reeves is a 64 y.o. male with PMH significant for HTN, CKD 4, chronic anemia GERD, bronchitis, back pain with history of surgery.   On 12/2, patient had prostatic biopsy done by Dr. Gilford Rile at [facility].  Next day, patient started having URI symptoms with productive cough, low-grade fever, sore throat, diarrhea, and weakness.   On 12/7, patient presented to the ED with persistence of the symptoms. In the ED, patient was afebrile, blood pressure was elevated up to 160s. Labs with creatinine elevated to 4.8 which is high compared to 2.8 in June 2021. EKG showed left ventricular hypertrophy.   High sensitive troponin slightly elevated to 50s.  BNP 243.   Patient was started on IV hydration and admitted to hospitalist service.  Hospital Course:  Patient left AGAINST MEDICAL ADVICE.  Please see the progress note of that day for further details on the assessment and plan made during the hospitalization. AKI on CKD 4 Diarrhea Hypertension  Medical Consultants:    none   Discharge Exam:  Not applicable.   Discharge Instructions:   The results of significant diagnostics from this hospitalization (including imaging, microbiology, ancillary and laboratory) are listed below for reference.    Procedures and Diagnostic Studies:   DG Chest 2 View  Result Date: 07/17/2020 CLINICAL DATA:  Chest pain, shortness of breath EXAM: CHEST - 2 VIEW COMPARISON:  08/20/2017 FINDINGS: The heart size and mediastinal contours are within normal limits. No focal airspace  consolidation, pleural effusion, or pneumothorax. Anterior bridging osteophytes throughout the thoracic spine. IMPRESSION: No active cardiopulmonary disease. Electronically Signed   By: Davina Poke D.O.   On: 07/17/2020 14:51   CT RENAL STONE STUDY  Result Date: 07/18/2020 CLINICAL DATA:  Hematuria with unknown cause. Shortness of breath, weakness, dizziness for 5 days. EXAM: CT ABDOMEN AND PELVIS WITHOUT CONTRAST TECHNIQUE: Multidetector CT imaging of the abdomen and pelvis was performed following the standard protocol without IV contrast. COMPARISON:  12/09/2015 FINDINGS: Lower chest:  No contributory findings. Hepatobiliary: No focal liver abnormality.No evidence of biliary obstruction or stone. Pancreas: Unremarkable. Spleen: Unremarkable. Adrenals/Urinary Tract: Negative adrenals. No hydronephrosis or stone. Prominent bladder wall thickness with perivesicular stranding which is mainly posterior. Stomach/Bowel: No obstruction. No appendicitis. Numerous colonic diverticula. Mild edematous appearance at the mesorectum likely related to the periprostatic inflammation. Vascular/Lymphatic: No acute vascular abnormality. No mass or adenopathy. Reproductive:Fat haziness around the prostate and seminal vesicles. Other: No ascites or pneumoperitoneum. Musculoskeletal: No acute abnormalities. Bulky spondylosis with multi-level ankylosis. L3-4 PLIF with solid arthrodesis. No acute osseous finding. IMPRESSION: 1. Fat inflammation in the pelvis, suspect prostatitis/cystitis. 2. No hydronephrosis or urolithiasis. 3. Colonic diverticulosis. Electronically Signed   By: Monte Fantasia M.D.   On: 07/18/2020 04:51     Labs:   Basic Metabolic Panel: Recent Labs  Lab 07/17/20 1338  NA 132*  K 3.8  CL 99  CO2 22  GLUCOSE 138*  BUN 40*  CREATININE 4.80*  CALCIUM 8.6*   GFR CrCl cannot be calculated (Unknown ideal weight.). Liver Function Tests: No results for input(s): AST, ALT, ALKPHOS,  BILITOT, PROT,  ALBUMIN in the last 168 hours. No results for input(s): LIPASE, AMYLASE in the last 168 hours. No results for input(s): AMMONIA in the last 168 hours. Coagulation profile No results for input(s): INR, PROTIME in the last 168 hours.  CBC: Recent Labs  Lab 07/17/20 1338  WBC 7.7  HGB 10.9*  HCT 34.9*  MCV 85.7  PLT 146*   Cardiac Enzymes: No results for input(s): CKTOTAL, CKMB, CKMBINDEX, TROPONINI in the last 168 hours. BNP: Invalid input(s): POCBNP CBG: No results for input(s): GLUCAP in the last 168 hours. D-Dimer No results for input(s): DDIMER in the last 72 hours. Hgb A1c No results for input(s): HGBA1C in the last 72 hours. Lipid Profile No results for input(s): CHOL, HDL, LDLCALC, TRIG, CHOLHDL, LDLDIRECT in the last 72 hours. Thyroid function studies No results for input(s): TSH, T4TOTAL, T3FREE, THYROIDAB in the last 72 hours.  Invalid input(s): FREET3 Anemia work up No results for input(s): VITAMINB12, FOLATE, FERRITIN, TIBC, IRON, RETICCTPCT in the last 72 hours. Microbiology Recent Results (from the past 240 hour(s))  Resp Panel by RT-PCR (Flu A&B, Covid) Nasopharyngeal Swab     Status: None   Collection Time: 07/17/20  1:38 PM   Specimen: Nasopharyngeal Swab; Nasopharyngeal(NP) swabs in vial transport medium  Result Value Ref Range Status   SARS Coronavirus 2 by RT PCR NEGATIVE NEGATIVE Final    Comment: (NOTE) SARS-CoV-2 target nucleic acids are NOT DETECTED.  The SARS-CoV-2 RNA is generally detectable in upper respiratory specimens during the acute phase of infection. The lowest concentration of SARS-CoV-2 viral copies this assay can detect is 138 copies/mL. A negative result does not preclude SARS-Cov-2 infection and should not be used as the sole basis for treatment or other patient management decisions. A negative result may occur with  improper specimen collection/handling, submission of specimen other than nasopharyngeal swab, presence of viral  mutation(s) within the areas targeted by this assay, and inadequate number of viral copies(<138 copies/mL). A negative result must be combined with clinical observations, patient history, and epidemiological information. The expected result is Negative.  Fact Sheet for Patients:  EntrepreneurPulse.com.au  Fact Sheet for Healthcare Providers:  IncredibleEmployment.be  This test is no t yet approved or cleared by the [place] FDA and  has been authorized for detection and/or diagnosis of SARS-CoV-2 by FDA under an Emergency Use Authorization (EUA). This EUA will remain  in effect (meaning this test can be used) for the duration of the COVID-19 declaration under Section 564(b)(1) of the Act, 21 U.S.C.section 360bbb-3(b)(1), unless the authorization is terminated  or revoked sooner.       Influenza A by PCR NEGATIVE NEGATIVE Final   Influenza B by PCR NEGATIVE NEGATIVE Final    Comment: (NOTE) The Xpert Xpress SARS-CoV-2/FLU/RSV plus assay is intended as an aid in the diagnosis of influenza from Nasopharyngeal swab specimens and should not be used as a sole basis for treatment. Nasal washings and aspirates are unacceptable for Xpert Xpress SARS-CoV-2/FLU/RSV testing.  Fact Sheet for Patients: EntrepreneurPulse.com.au  Fact Sheet for Healthcare Providers: IncredibleEmployment.be  This test is not yet approved or cleared by the [place] FDA and has been authorized for detection and/or diagnosis of SARS-CoV-2 by FDA under an Emergency Use Authorization (EUA). This EUA will remain in effect (meaning this test can be used) for the duration of the COVID-19 declaration under Section 564(b)(1) of the Act, 21 U.S.C. section 360bbb-3(b)(1), unless the authorization is terminated or revoked.  Performed at [facility]  [facility], [place] [address] [place], [place] [address]     Signed: Terrilee Croak  [facility] 07/19/2020, 3:55 PM

## 2021-03-06 ENCOUNTER — Other Ambulatory Visit: Payer: Self-pay

## 2021-03-06 ENCOUNTER — Emergency Department (HOSPITAL_COMMUNITY)
Admission: EM | Admit: 2021-03-06 | Discharge: 2021-03-07 | Disposition: A | Discharge status: Home / Self Care | Payer: No Typology Code available for payment source | Attending: Emergency Medicine | Admitting: Emergency Medicine

## 2021-03-06 ENCOUNTER — Encounter (HOSPITAL_COMMUNITY): Payer: Self-pay

## 2021-03-06 DIAGNOSIS — Z5321 Procedure and treatment not carried out due to patient leaving prior to being seen by health care provider: Secondary | ICD-10-CM | POA: Insufficient documentation

## 2021-03-06 DIAGNOSIS — T63441A Toxic effect of venom of bees, accidental (unintentional), initial encounter: Secondary | ICD-10-CM | POA: Insufficient documentation

## 2021-03-06 NOTE — ED Provider Notes (Signed)
Emergency Medicine Provider Triage Evaluation Note  Ronald Reeves , a 65 y.o. male  was evaluated in triage.  Pt complains of multiple bee stings to L foot. Happened a couple hours ago. States he was weeding. Denies any difficulty breathing, throat closing, rash .No hives. Put calamine lotion on it. No prior allergy to bee.   Review of Systems  Positive: Bee stings  Negative:   Physical Exam  BP (!) 156/74 (BP Location: Left Arm)   Pulse 78   Temp 97.8 F (36.6 C) (Oral)   Resp 16   Ht 5\' 10"  (1.778 m)   Wt 79.4 kg   SpO2 100%   BMI 25.11 kg/m  Gen:   Awake, no distress   Resp:  Normal effort  MSK:   Moves extremities without difficulty, no obvious bites noticeable to foot. No edema. DP 2+ Other:  No angioedema. No urticaria   Medical Decision Making  Medically screening exam initiated at 8:12 PM.  Appropriate orders placed.  Sharron Simpson was informed that the remainder of the evaluation will be completed by another provider, this initial triage assessment does not replace that evaluation, and the importance of remaining in the ED until their evaluation is complete.     Alfredia Client, PA-C 03/06/21 2015    Horton, Alvin Critchley, DO 03/07/21 0015

## 2021-03-06 NOTE — ED Triage Notes (Signed)
Pt reports being stung about 15 times by bees to his left lower leg. Pt endorses pain and burning. Pt denies throat swelling and trouble breathing. No obvious swelling.

## 2021-03-11 ENCOUNTER — Emergency Department (HOSPITAL_COMMUNITY): Payer: No Typology Code available for payment source

## 2021-03-11 ENCOUNTER — Emergency Department (HOSPITAL_COMMUNITY)
Admission: EM | Admit: 2021-03-11 | Discharge: 2021-03-12 | Disposition: A | Discharge status: Home / Self Care | Payer: No Typology Code available for payment source | Attending: Physician Assistant | Admitting: Physician Assistant

## 2021-03-11 DIAGNOSIS — R531 Weakness: Secondary | ICD-10-CM | POA: Insufficient documentation

## 2021-03-11 DIAGNOSIS — M791 Myalgia, unspecified site: Secondary | ICD-10-CM | POA: Insufficient documentation

## 2021-03-11 DIAGNOSIS — R509 Fever, unspecified: Secondary | ICD-10-CM | POA: Diagnosis not present

## 2021-03-11 DIAGNOSIS — R059 Cough, unspecified: Secondary | ICD-10-CM | POA: Diagnosis not present

## 2021-03-11 DIAGNOSIS — R0602 Shortness of breath: Secondary | ICD-10-CM | POA: Diagnosis present

## 2021-03-11 DIAGNOSIS — U071 COVID-19: Secondary | ICD-10-CM | POA: Diagnosis not present

## 2021-03-11 DIAGNOSIS — N189 Chronic kidney disease, unspecified: Secondary | ICD-10-CM | POA: Diagnosis not present

## 2021-03-11 NOTE — ED Triage Notes (Signed)
Pt c/o SHOB, productive cough. Tested positive for covid today. Vaccinated but not boosted. Was advised by PCP to come to hospital due to hx CKD

## 2021-03-11 NOTE — ED Provider Notes (Signed)
Emergency Medicine Provider Triage Evaluation Note  Ronald Reeves , a 65 y.o. male  was evaluated in triage.  Pt complains of COVID symptoms including myalgias, subjective fevers, productive cough, shortness of breath, generalized weakness onset 3 days ago.  Patient reports that he tested positive for COVID on a home antigen test today.  Many others in his family are also positive.  Reports when he called his primary care physician they recommended that he come here to the emergency department.  Patient has a history of CKD and acute renal failure.  Review of Systems  Positive: Cough, congestion, myalgias, diarrhea Negative: Syncope, dysuria  Physical Exam  BP (!) 162/72 (BP Location: Right Arm)   Pulse 72   Temp 98.9 F (37.2 C) (Oral)   Resp 20   SpO2 100%  Gen:   Awake, no distress   Resp:  Normal effort, clear breath sounds anteriorly MSK:   Moves extremities without difficulty  Other:  Abdomen soft, generally tender  Medical Decision Making  Medically screening exam initiated at 10:39 PM.  Appropriate orders placed.  Ronald Reeves was informed that the remainder of the evaluation will be completed by another provider, this initial triage assessment does not replace that evaluation, and the importance of remaining in the ED until their evaluation is complete.  COVID-positive.  Basic labs and testing ordered.  No tachycardia or hypoxia here in triage.   Ronald Reeves, Gwenlyn Perking 03/11/21 [place], [place], DO 03/11/21 2319

## 2021-03-12 ENCOUNTER — Other Ambulatory Visit: Payer: Self-pay

## 2021-03-12 DIAGNOSIS — N189 Chronic kidney disease, unspecified: Secondary | ICD-10-CM

## 2021-03-12 LAB — COMPREHENSIVE METABOLIC PANEL
ALT: 33 U/L (ref 0–44)
AST: 47 U/L — ABNORMAL HIGH (ref 15–41)
Albumin: 3.8 g/dL (ref 3.5–5.0)
Alkaline Phosphatase: 85 U/L (ref 38–126)
Anion gap: 12 (ref 5–15)
BUN: 40 mg/dL — ABNORMAL HIGH (ref 8–23)
CO2: 18 mmol/L — ABNORMAL LOW (ref 22–32)
Calcium: 8.9 mg/dL (ref 8.9–10.3)
Chloride: 107 mmol/L (ref 98–111)
Creatinine, Ser: 5.24 mg/dL — ABNORMAL HIGH (ref 0.61–1.24)
GFR, Estimated: 12 mL/min — ABNORMAL LOW (ref >60)
Glucose, Bld: 85 mg/dL (ref 70–99)
Potassium: 4.4 mmol/L (ref 3.5–5.1)
Sodium: 137 mmol/L (ref 135–145)
Total Bilirubin: 0.9 mg/dL (ref 0.3–1.2)
Total Protein: 7.9 g/dL (ref 6.5–8.1)

## 2021-03-12 LAB — CBC WITH DIFFERENTIAL/PLATELET
Abs Immature Granulocytes: 0.02 10*3/uL (ref 0.00–0.07)
Basophils Absolute: 0 10*3/uL (ref 0.0–0.1)
Basophils Relative: 1 %
Eosinophils Absolute: 0.2 10*3/uL (ref 0.0–0.5)
Eosinophils Relative: 4 %
HCT: 42.8 % (ref 39.0–52.0)
Hemoglobin: 13.7 g/dL (ref 13.0–17.0)
Immature Granulocytes: 1 %
Lymphocytes Relative: 32 %
Lymphs Abs: 1.4 10*3/uL (ref 0.7–4.0)
MCH: 27.2 pg (ref 26.0–34.0)
MCHC: 32 g/dL (ref 30.0–36.0)
MCV: 84.9 fL (ref 80.0–100.0)
Monocytes Absolute: 0.7 10*3/uL (ref 0.1–1.0)
Monocytes Relative: 15 %
Neutro Abs: 2.1 10*3/uL (ref 1.7–7.7)
Neutrophils Relative %: 47 %
Platelets: 117 10*3/uL — ABNORMAL LOW (ref 150–400)
RBC: 5.04 MIL/uL (ref 4.22–5.81)
RDW: 15.6 % — ABNORMAL HIGH (ref 11.5–15.5)
Smear Review: DECREASED
WBC: 4.4 10*3/uL (ref 4.0–10.5)
nRBC: 0 % (ref 0.0–0.2)

## 2021-03-12 LAB — LIPASE, BLOOD: Lipase: 53 U/L — ABNORMAL HIGH (ref 11–51)

## 2021-03-12 NOTE — ED Notes (Addendum)
Pt called 3x no answer  

## 2021-03-26 ENCOUNTER — Ambulatory Visit (HOSPITAL_COMMUNITY)
Admission: RE | Admit: 2021-03-26 | Discharge: 2021-03-26 | Disposition: A | Discharge status: Home / Self Care | Payer: No Typology Code available for payment source | Source: Ambulatory Visit | Attending: Vascular Surgery | Admitting: Vascular Surgery

## 2021-03-26 ENCOUNTER — Other Ambulatory Visit: Payer: Self-pay

## 2021-03-26 ENCOUNTER — Ambulatory Visit (INDEPENDENT_AMBULATORY_CARE_PROVIDER_SITE_OTHER): Payer: No Typology Code available for payment source | Admitting: Vascular Surgery

## 2021-03-26 ENCOUNTER — Encounter: Payer: Self-pay | Admitting: Vascular Surgery

## 2021-03-26 ENCOUNTER — Ambulatory Visit (INDEPENDENT_AMBULATORY_CARE_PROVIDER_SITE_OTHER)
Admission: RE | Admit: 2021-03-26 | Discharge: 2021-03-26 | Disposition: A | Discharge status: Home / Self Care | Payer: No Typology Code available for payment source | Source: Ambulatory Visit | Attending: Vascular Surgery | Admitting: Vascular Surgery

## 2021-03-26 VITALS — BP 134/67 | HR 64 | Temp 98.9°F | Resp 18 | Ht 70.0 in | Wt 159.0 lb

## 2021-03-26 DIAGNOSIS — N189 Chronic kidney disease, unspecified: Secondary | ICD-10-CM

## 2021-03-26 DIAGNOSIS — N184 Chronic kidney disease, stage 4 (severe): Secondary | ICD-10-CM | POA: Diagnosis not present

## 2021-03-26 NOTE — Progress Notes (Signed)
Patient name: Ronald Reeves MRN: [id number] DOB: [birth year] Sex: male  REASON FOR CONSULT: Evaluate for permanent dialysis access  HPI: Ronald Reeves is a 65 y.o. male, with history of hypertension and stage IV CKD as well as polysubstance abuse that presents for evaluation of permanent dialysis access.  Patient states he is right-handed.  He's had no access in the past.  He would prefer access in the left arm.  No chest wall implants.  No numbness or tingling in his arms or upper extremities.  Past Medical History:  Diagnosis Date   Back pain    Bronchitis    GERD (gastroesophageal reflux disease)    Hypertension     Past Surgical History:  Procedure Laterality Date   BACK SURGERY      Family History  Problem Relation Age of Onset   Heart disease Mother    Heart attack Sister 78    SOCIAL HISTORY: Social History   Socioeconomic History   Marital status: Single    Spouse name: Not on file   Number of children: Not on file   Years of education: Not on file   Highest education level: Not on file  Occupational History   Not on file  Tobacco Use   Smoking status: Every Day    Packs/day: 0.50    Types: Cigarettes   Smokeless tobacco: Never  Vaping Use   Vaping Use: Never used  Substance and Sexual Activity   Alcohol use: Yes    Alcohol/week: 7.0 standard drinks    Types: 7 Cans of beer per week    Comment: 24 oz can of beer per day   Drug use: Yes    Types: Cocaine    Comment: pt states he uses crack cocaine at least 2xweek.    Sexual activity: Yes  Other Topics Concern   Not on file  Social History Narrative   Not on file   Social Determinants of Health   Financial Resource Strain: Not on file  Food Insecurity: Not on file  Transportation Needs: Not on file  Physical Activity: Not on file  Stress: Not on file  Social Connections: Not on file  Intimate Partner Violence: Not on file    No Known Allergies  Current Outpatient Medications  Medication Sig  Dispense Refill   amLODipine (NORVASC) 10 MG tablet Take 1 tablet (10 mg total) by mouth daily. 90 tablet 3   aspirin EC 81 MG tablet Take 81 mg by mouth daily.     Cholecalciferol 25 MCG (1000 UT) tablet Take 1,000 Units by mouth daily.     furosemide (LASIX) 20 MG tablet Take 10 mg by mouth daily.     hydrALAZINE (APRESOLINE) 25 MG tablet Take 2 tablets (50 mg total) by mouth every morning. (Patient taking differently: Take 25 mg by mouth in the morning and at bedtime.) 180 tablet 3   isosorbide mononitrate (IMDUR) 30 MG 24 hr tablet Take 1 tablet (30 mg total) by mouth daily. 90 tablet 3   labetalol (NORMODYNE) 100 MG tablet Take 2 tablets (200 mg total) by mouth every morning. 180 tablet 3   rosuvastatin (CRESTOR) 40 MG tablet Take 40 mg by mouth daily.     atorvastatin (LIPITOR) 40 MG tablet Take 1 tablet (40 mg total) by mouth daily. (Patient not taking: Reported on 07/18/2020) 90 tablet 3   HYDROcodone-acetaminophen (NORCO) 5-325 MG tablet Take 1-2 tablets by mouth every 4 (four) hours as needed for severe pain. (Patient not  taking: No sig reported) 10 tablet 0   methocarbamol (ROBAXIN) 500 MG tablet Take 1 tablet (500 mg total) by mouth 2 (two) times daily as needed for muscle spasms. (Patient not taking: No sig reported) 10 tablet 0   No current facility-administered medications for this visit.    REVIEW OF SYSTEMS:  [X]  denotes positive finding, [ ]  denotes negative finding Cardiac  Comments:  Chest pain or chest pressure:    Shortness of breath upon exertion:    Short of breath when lying flat:    Irregular heart rhythm:        Vascular    Pain in calf, thigh, or hip brought on by ambulation: x   Pain in feet at night that wakes you up from your sleep:     Blood clot in your veins:    Leg swelling:         Pulmonary    Oxygen at home:    Productive cough:     Wheezing:         Neurologic    Sudden weakness in arms or legs:     Sudden numbness in arms or legs:      Sudden onset of difficulty speaking or slurred speech:    Temporary loss of vision in one eye:     Problems with dizziness:  x       Gastrointestinal    Blood in stool:     Vomited blood:         Genitourinary    Burning when urinating:     Blood in urine:        Psychiatric    Major depression:         Hematologic    Bleeding problems:    Problems with blood clotting too easily:        Skin    Rashes or ulcers:        Constitutional    Fever or chills:      PHYSICAL EXAM: Vitals:   03/26/21 1511  BP: 134/67  Pulse: 64  Resp: 18  Temp: 98.9 F (37.2 C)  TempSrc: Temporal  SpO2: 97%  Weight: 159 lb (72.1 kg)  Height: 5\' 10"  (1.778 m)    GENERAL: The patient is a well-nourished male, in no acute distress. The vital signs are documented above. CARDIAC: There is a regular rate and rhythm.  VASCULAR:  Palpable radial brachial pulses bilateral upper extremities No chest wall implants PULMONARY: No respiratory distress ABDOMEN: Soft and non-tender MUSCULOSKELETAL: There are no major deformities or cyanosis. NEUROLOGIC: No focal weakness or paresthesias are detected. SKIN: There are no ulcers or rashes noted. PSYCHIATRIC: The patient has a normal affect.  DATA:   Arterial duplex upper extremity shows triphasic waveforms bilaterally with a high bifurcation on the right  Upper extremity vein mapping shows a good basilic vein in the left arm which is his nondominant arm  Assessment/Plan:  65 year old male with stage IV CKD that presents for evaluation of permanent dialysis access.  I discussed plans for placement in the nondominant arm which would be his left arm.  Vein mapping shows usable basilic vein.  I discussed this typically be done in two stages.  I discussed this being done outpatient at [facility].  We discussed steps of surgery.  I discussed risk and benefits including bleeding, infection, risk of steal, failure to mature.  We will get him scheduled  today.  I discussed the fistula can take up to 3  months to mature after it is placed for it to be used,  Marty Heck, MD [facility] Office: [phone]

## 2021-03-27 ENCOUNTER — Other Ambulatory Visit: Payer: Self-pay

## 2021-04-10 ENCOUNTER — Encounter (HOSPITAL_COMMUNITY): Payer: Self-pay | Admitting: Vascular Surgery

## 2021-04-10 ENCOUNTER — Other Ambulatory Visit: Payer: Self-pay

## 2021-04-11 NOTE — Anesthesia Preprocedure Evaluation (Addendum)
Anesthesia Evaluation  Patient identified by MRN, date of birth, ID band Patient awake    Reviewed: Allergy & Precautions, NPO status , Patient's Chart, lab work & pertinent test results, reviewed documented beta blocker date and time   History of Anesthesia Complications Negative for: history of anesthetic complications  Airway Mallampati: II  TM Distance: >3 FB Neck ROM: Full    Dental  (+) Dental Advisory Given, Poor Dentition, Missing, Chipped   Pulmonary sleep apnea (does not use CPAP) , Current Smoker and Patient abstained from smoking.,    breath sounds clear to auscultation       Cardiovascular hypertension, Pt. on medications and Pt. 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)     Musculoskeletal   Abdominal   Peds  Hematology  (+) Blood dyscrasia (Hb 10.9), anemia ,   Anesthesia Other Findings   Reproductive/Obstetrics                           Anesthesia Physical Anesthesia Plan  ASA: 3  Anesthesia Plan: Regional and MAC   Post-op Pain Management:    Induction:   PONV Risk Score and Plan: 0 and Treatment may vary due to age or medical condition  Airway Management Planned: Natural Airway and Simple Face Mask  Additional Equipment: None  Intra-op Plan:   Post-operative Plan:   Informed Consent: I have reviewed the patients History and Physical, chart, labs and discussed the procedure including the risks, benefits and alternatives for the proposed anesthesia with the patient or authorized representative who has indicated his/her  understanding and acceptance.     Dental advisory given  Plan Discussed with: CRNA and Surgeon  Anesthesia Plan Comments: (Plan routine monitors, interscalene block)       Anesthesia Quick Evaluation

## 2021-04-12 ENCOUNTER — Ambulatory Visit (HOSPITAL_COMMUNITY): Payer: No Typology Code available for payment source | Admitting: Anesthesiology

## 2021-04-12 ENCOUNTER — Other Ambulatory Visit: Payer: Self-pay

## 2021-04-12 ENCOUNTER — Ambulatory Visit (HOSPITAL_COMMUNITY)
Admission: RE | Admit: 2021-04-12 | Discharge: 2021-04-12 | Disposition: A | Discharge status: Home / Self Care | Payer: No Typology Code available for payment source | Attending: Vascular Surgery | Admitting: Vascular Surgery

## 2021-04-12 ENCOUNTER — Encounter (HOSPITAL_COMMUNITY): Payer: Self-pay | Admitting: Vascular Surgery

## 2021-04-12 ENCOUNTER — Encounter (HOSPITAL_COMMUNITY)
Admission: RE | Disposition: A | Discharge status: Home / Self Care | Payer: Self-pay | Source: Home / Self Care | Attending: Vascular Surgery

## 2021-04-12 DIAGNOSIS — Z79899 Other long term (current) drug therapy: Secondary | ICD-10-CM | POA: Diagnosis not present

## 2021-04-12 DIAGNOSIS — I129 Hypertensive chronic kidney disease with stage 1 through stage 4 chronic kidney disease, or unspecified chronic kidney disease: Secondary | ICD-10-CM | POA: Diagnosis not present

## 2021-04-12 DIAGNOSIS — F17210 Nicotine dependence, cigarettes, uncomplicated: Secondary | ICD-10-CM | POA: Insufficient documentation

## 2021-04-12 DIAGNOSIS — N184 Chronic kidney disease, stage 4 (severe): Secondary | ICD-10-CM | POA: Insufficient documentation

## 2021-04-12 DIAGNOSIS — Z7982 Long term (current) use of aspirin: Secondary | ICD-10-CM | POA: Diagnosis not present

## 2021-04-12 HISTORY — DX: Acute myocardial infarction, unspecified: I21.9

## 2021-04-12 HISTORY — DX: Unspecified osteoarthritis, unspecified site: M19.90

## 2021-04-12 HISTORY — DX: Anemia, unspecified: D64.9

## 2021-04-12 HISTORY — DX: Sleep apnea, unspecified: G47.30

## 2021-04-12 HISTORY — DX: Atherosclerotic heart disease of native coronary artery without angina pectoris: I25.10

## 2021-04-12 HISTORY — DX: Chronic kidney disease, unspecified: N18.9

## 2021-04-12 HISTORY — PX: AV FISTULA PLACEMENT: SHX1204

## 2021-04-12 LAB — POCT I-STAT, CHEM 8
BUN: 49 mg/dL — ABNORMAL HIGH (ref 8–23)
Calcium, Ion: 1.16 mmol/L (ref 1.15–1.40)
Chloride: 111 mmol/L (ref 98–111)
Creatinine, Ser: 4.5 mg/dL — ABNORMAL HIGH (ref 0.61–1.24)
Glucose, Bld: 86 mg/dL (ref 70–99)
HCT: 32 % — ABNORMAL LOW (ref 39.0–52.0)
Hemoglobin: 10.9 g/dL — ABNORMAL LOW (ref 13.0–17.0)
Potassium: 4.7 mmol/L (ref 3.5–5.1)
Sodium: 141 mmol/L (ref 135–145)
TCO2: 21 mmol/L — ABNORMAL LOW (ref 22–32)

## 2021-04-12 SURGERY — ARTERIOVENOUS (AV) FISTULA CREATION
Anesthesia: Monitor Anesthesia Care | Site: Arm Upper | Laterality: Left

## 2021-04-12 MED ORDER — EPHEDRINE SULFATE-NACL 50-0.9 MG/10ML-% IV SOSY
PREFILLED_SYRINGE | INTRAVENOUS | Status: DC | PRN
  Administered 2021-04-12: 10 mg via INTRAVENOUS
  Administered 2021-04-12: 5 mg via INTRAVENOUS
  Administered 2021-04-12: 10 mg via INTRAVENOUS
  Administered 2021-04-12: 5 mg via INTRAVENOUS

## 2021-04-12 MED ORDER — FENTANYL CITRATE (PF) 250 MCG/5ML IJ SOLN
INTRAMUSCULAR | Status: AC
  Filled 2021-04-12: qty 5

## 2021-04-12 MED ORDER — HEPARIN SODIUM (PORCINE) 1000 UNIT/ML IJ SOLN
INTRAMUSCULAR | Status: DC | PRN
  Administered 2021-04-12 (×2): 3000 [IU] via INTRAVENOUS

## 2021-04-12 MED ORDER — HYDROCODONE-ACETAMINOPHEN 5-325 MG PO TABS: 1.0000 | ORAL_TABLET | Freq: Four times a day (QID) | ORAL | 0 refills | Status: DC | PRN

## 2021-04-12 MED ORDER — PHENYLEPHRINE HCL-NACL 20-0.9 MG/250ML-% IV SOLN
INTRAVENOUS | Status: DC | PRN
  Administered 2021-04-12: 20 ug/min via INTRAVENOUS

## 2021-04-12 MED ORDER — MIDAZOLAM HCL 2 MG/2ML IJ SOLN: INTRAMUSCULAR | Status: DC | PRN

## 2021-04-12 MED ORDER — LIDOCAINE HCL (PF) 1 % IJ SOLN
INTRAMUSCULAR | Status: AC
  Filled 2021-04-12: qty 30

## 2021-04-12 MED ORDER — HEPARIN 6000 UNIT IRRIGATION SOLUTION
Status: DC | PRN
  Administered 2021-04-12: 1

## 2021-04-12 MED ORDER — MIDAZOLAM HCL 2 MG/2ML IJ SOLN
INTRAMUSCULAR | Status: AC
  Filled 2021-04-12: qty 2

## 2021-04-12 MED ORDER — OXYCODONE-ACETAMINOPHEN 5-325 MG PO TABS: 1.0000 | ORAL_TABLET | Freq: Four times a day (QID) | ORAL | 0 refills | Status: DC | PRN

## 2021-04-12 MED ORDER — LIDOCAINE-EPINEPHRINE (PF) 1 %-1:200000 IJ SOLN
INTRAMUSCULAR | Status: AC
  Filled 2021-04-12: qty 30

## 2021-04-12 MED ORDER — CHLORHEXIDINE GLUCONATE 0.12 % MT SOLN
15.0000 mL | Freq: Once | OROMUCOSAL | Status: AC
  Administered 2021-04-12: 15 mL via OROMUCOSAL
  Filled 2021-04-12: qty 15

## 2021-04-12 MED ORDER — LIDOCAINE 2% (20 MG/ML) 5 ML SYRINGE
INTRAMUSCULAR | Status: DC | PRN
  Administered 2021-04-12: 40 mg via INTRAVENOUS

## 2021-04-12 MED ORDER — FENTANYL CITRATE (PF) 100 MCG/2ML IJ SOLN: 25.0000 ug | INTRAMUSCULAR | Status: DC | PRN

## 2021-04-12 MED ORDER — HEPARIN 6000 UNIT IRRIGATION SOLUTION
Status: AC
  Filled 2021-04-12: qty 500

## 2021-04-12 MED ORDER — LACTATED RINGERS IV SOLN: INTRAVENOUS | Status: DC

## 2021-04-12 MED ORDER — MEPIVACAINE HCL (PF) 2 % IJ SOLN
INTRAMUSCULAR | Status: DC | PRN
  Administered 2021-04-12: 20 mL

## 2021-04-12 MED ORDER — ONDANSETRON HCL 4 MG/2ML IJ SOLN
INTRAMUSCULAR | Status: DC | PRN
  Administered 2021-04-12: 4 mg via INTRAVENOUS

## 2021-04-12 MED ORDER — MIDAZOLAM HCL 2 MG/2ML IJ SOLN: 0.5000 mg | Freq: Once | INTRAMUSCULAR | Status: DC | PRN

## 2021-04-12 MED ORDER — OXYCODONE HCL 5 MG/5ML PO SOLN
5.0000 mg | Freq: Once | ORAL | Status: DC | PRN
Start: 2021-04-12 — End: 2021-04-12

## 2021-04-12 MED ORDER — ORAL CARE MOUTH RINSE: 15.0000 mL | Freq: Once | OROMUCOSAL | Status: AC

## 2021-04-12 MED ORDER — CHLORHEXIDINE GLUCONATE 4 % EX LIQD: 60.0000 mL | Freq: Once | CUTANEOUS | Status: DC

## 2021-04-12 MED ORDER — PROMETHAZINE HCL 25 MG/ML IJ SOLN: 6.2500 mg | INTRAMUSCULAR | Status: DC | PRN

## 2021-04-12 MED ORDER — MIDAZOLAM HCL 2 MG/2ML IJ SOLN
INTRAMUSCULAR | Status: DC | PRN
  Administered 2021-04-12: 1 mg via INTRAVENOUS

## 2021-04-12 MED ORDER — ALBUMIN HUMAN 5 % IV SOLN: INTRAVENOUS | Status: DC | PRN

## 2021-04-12 MED ORDER — PROPOFOL 10 MG/ML IV BOLUS
INTRAVENOUS | Status: AC
  Filled 2021-04-12: qty 20

## 2021-04-12 MED ORDER — PROPOFOL 10 MG/ML IV BOLUS
INTRAVENOUS | Status: DC | PRN
  Administered 2021-04-12: 10 mg via INTRAVENOUS
  Administered 2021-04-12: 20 mg via INTRAVENOUS

## 2021-04-12 MED ORDER — 0.9 % SODIUM CHLORIDE (POUR BTL) OPTIME
TOPICAL | Status: DC | PRN
  Administered 2021-04-12: 1000 mL

## 2021-04-12 MED ORDER — EPHEDRINE 5 MG/ML INJ
INTRAVENOUS | Status: AC
  Filled 2021-04-12: qty 5

## 2021-04-12 MED ORDER — CEFAZOLIN SODIUM-DEXTROSE 2-4 GM/100ML-% IV SOLN
2.0000 g | INTRAVENOUS | Status: AC
  Administered 2021-04-12: 2 g via INTRAVENOUS
  Filled 2021-04-12: qty 100

## 2021-04-12 MED ORDER — FENTANYL CITRATE (PF) 250 MCG/5ML IJ SOLN
INTRAMUSCULAR | Status: DC | PRN
  Administered 2021-04-12 (×2): 50 ug via INTRAVENOUS

## 2021-04-12 MED ORDER — SODIUM CHLORIDE 0.9 % IV SOLN: INTRAVENOUS | Status: DC

## 2021-04-12 MED ORDER — OXYCODONE HCL 5 MG PO TABS
5.0000 mg | ORAL_TABLET | Freq: Once | ORAL | Status: DC | PRN
Start: 2021-04-12 — End: 2021-04-12

## 2021-04-12 MED ORDER — PROPOFOL 500 MG/50ML IV EMUL
INTRAVENOUS | Status: DC | PRN
  Administered 2021-04-12: 60 ug/kg/min via INTRAVENOUS

## 2021-04-12 SURGICAL SUPPLY — 36 items
ADH SKN CLS APL DERMABOND .7 (GAUZE/BANDAGES/DRESSINGS) ×1
AGENT HMST SPONGE THK3/8 (HEMOSTASIS)
ARMBAND PINK RESTRICT EXTREMIT (MISCELLANEOUS) ×4 IMPLANT
BAG COUNTER SPONGE SURGICOUNT (BAG) ×2 IMPLANT
BAG SPNG CNTER NS LX DISP (BAG) ×1
BLADE CLIPPER SURG (BLADE) ×2 IMPLANT
CANISTER SUCT 3000ML PPV (MISCELLANEOUS) ×2 IMPLANT
CLIP VESOCCLUDE MED 6/CT (CLIP) ×2 IMPLANT
CLIP VESOCCLUDE SM WIDE 6/CT (CLIP) ×2 IMPLANT
COVER PROBE W GEL 5X96 (DRAPES) ×2 IMPLANT
DECANTER SPIKE VIAL GLASS SM (MISCELLANEOUS) ×2 IMPLANT
DERMABOND ADVANCED (GAUZE/BANDAGES/DRESSINGS) ×1
DERMABOND ADVANCED .7 DNX12 (GAUZE/BANDAGES/DRESSINGS) ×1 IMPLANT
ELECT REM PT RETURN 9FT ADLT (ELECTROSURGICAL) ×2
ELECTRODE REM PT RTRN 9FT ADLT (ELECTROSURGICAL) ×1 IMPLANT
GLOVE SRG 8 PF TXTR STRL LF DI (GLOVE) ×1 IMPLANT
GLOVE SURG ENC MOIS LTX SZ7.5 (GLOVE) ×2 IMPLANT
GLOVE SURG UNDER POLY LF SZ8 (GLOVE) ×2
GOWN STRL REUS W/ TWL LRG LVL3 (GOWN DISPOSABLE) ×2 IMPLANT
GOWN STRL REUS W/ TWL XL LVL3 (GOWN DISPOSABLE) ×2 IMPLANT
GOWN STRL REUS W/TWL LRG LVL3 (GOWN DISPOSABLE) ×4
GOWN STRL REUS W/TWL XL LVL3 (GOWN DISPOSABLE) ×4
HEMOSTAT SPONGE AVITENE ULTRA (HEMOSTASIS) IMPLANT
KIT BASIN OR (CUSTOM PROCEDURE TRAY) ×2 IMPLANT
KIT TURNOVER KIT B (KITS) ×2 IMPLANT
NS IRRIG 1000ML POUR BTL (IV SOLUTION) ×2 IMPLANT
PACK CV ACCESS (CUSTOM PROCEDURE TRAY) ×2 IMPLANT
PAD ARMBOARD 7.5X6 YLW CONV (MISCELLANEOUS) ×4 IMPLANT
SUT MNCRL AB 4-0 PS2 18 (SUTURE) ×2 IMPLANT
SUT PROLENE 6 0 BV (SUTURE) ×5 IMPLANT
SUT PROLENE 7 0 BV 1 (SUTURE) IMPLANT
SUT VIC AB 3-0 SH 27 (SUTURE) ×2
SUT VIC AB 3-0 SH 27X BRD (SUTURE) ×1 IMPLANT
TOWEL GREEN STERILE (TOWEL DISPOSABLE) ×2 IMPLANT
UNDERPAD 30X36 HEAVY ABSORB (UNDERPADS AND DIAPERS) ×2 IMPLANT
WATER STERILE IRR 1000ML POUR (IV SOLUTION) ×2 IMPLANT

## 2021-04-12 NOTE — Anesthesia Procedure Notes (Signed)
Procedure Name: MAC Date/Time: 04/12/2021 7:36 AM Performed by: Janace Litten, CRNA Pre-anesthesia Checklist: Patient identified, Emergency Drugs available, Suction available and Patient being monitored Patient Re-evaluated:Patient Re-evaluated prior to induction Oxygen Delivery Method: Nasal cannula

## 2021-04-12 NOTE — Anesthesia Procedure Notes (Signed)
Anesthesia Regional Block: Interscalene brachial plexus block   Pre-Anesthetic Checklist: , timeout performed,  Correct Patient, Correct Site, Correct Laterality,  Correct Procedure, Correct Position, site marked,  Risks and benefits discussed,  Surgical consent,  Pre-op evaluation,  At surgeon's request and post-op pain management  Laterality: Left and Upper  Prep: chloraprep       Needles:  Injection technique: Single-shot  Needle Type: Echogenic Needle     Needle Length: 9cm  Needle Gauge: 21     Additional Needles:   Procedures:,,,, ultrasound used (permanent image in chart),,    Narrative:  Start time: 04/12/2021 7:10 AM End time: 04/12/2021 7:16 AM Injection made incrementally with aspirations every 5 mL.  Performed by: Personally  Anesthesiologist: Annye Asa, MD  Additional Notes: Pt identified in Holding room.  Monitors applied. Working IV access confirmed. Sterile prep L clavicle and neck.  #21ga ECHOgenic Arrow block needle to interscalene brachial plexus with US guidance.  20cc 2% Mepivacaine injected incrementally after negative test dose.  Patient asymptomatic, VSS, no heme aspirated, tolerated well.   Jenita Seashore, MD

## 2021-04-12 NOTE — Transfer of Care (Signed)
Immediate Anesthesia Transfer of Care Note  Patient: Ronald Reeves  Procedure(s) Performed: LEFT ARM ARTERIOVENOUS (AV) FISTULA CREATION (Left: Arm Upper)  Patient Location: PACU  Anesthesia Type:General  Level of Consciousness: drowsy, patient cooperative and responds to stimulation  Airway & Oxygen Therapy: Patient Spontanous Breathing and Patient connected to nasal cannula oxygen  Post-op Assessment: Report given to RN and Post -op Vital signs reviewed and stable  Post vital signs: Reviewed and stable  Last Vitals:  Vitals Value Taken Time  BP 135/68 04/12/21 0927  Temp    Pulse 52 04/12/21 0929  Resp 10 04/12/21 0929  SpO2 100 % 04/12/21 0929  Vitals shown include unvalidated device data.  Last Pain:  Vitals:   04/12/21 0639  TempSrc:   PainSc: 7       Patients Stated Pain Goal: 4 (31/43/88 8757)  Complications: No notable events documented.

## 2021-04-12 NOTE — H&P (Signed)
History and Physical Interval Note:  04/12/2021 7:34 AM  [name]  has presented today for surgery, with the diagnosis of Chronic Kidney Disease.  The various methods of treatment have been discussed with the patient and family. After consideration of risks, benefits and other options for treatment, the patient has consented to  Procedure(s): LEFT ARM ARTERIOVENOUS (AV) FISTULA CREATION (Left) as a surgical intervention.  The patient's history has been reviewed, patient examined, no change in status, stable for surgery.  I have reviewed the patient's chart and labs.  Questions were answered to the patient's satisfaction.     Marty Heck  Patient name: Ronald Reeves        MRN: [id number]        DOB: 02/16/1956          Sex: male   REASON FOR CONSULT: Evaluate for permanent dialysis access   HPI: Ronald Reeves is a 65 y.o. male, with history of hypertension and stage IV CKD as well as polysubstance abuse that presents for evaluation of permanent dialysis access.  Patient states he is right-handed.  He's had no access in the past.  He would prefer access in the left arm.  No chest wall implants.  No numbness or tingling in his arms or upper extremities.       Past Medical History:  Diagnosis Date   Back pain     Bronchitis     GERD (gastroesophageal reflux disease)     Hypertension             Past Surgical History:  Procedure Laterality Date   BACK SURGERY               Family History  Problem Relation Age of Onset   Heart disease Mother     Heart attack Sister 23      SOCIAL HISTORY: Social History         Socioeconomic History   Marital status: Single      Spouse name: Not on file   Number of children: Not on file   Years of education: Not on file   Highest education level: Not on file  Occupational History   Not on file  Tobacco Use   Smoking status: Every Day      Packs/day: 0.50      Types: Cigarettes   Smokeless tobacco: Never  Vaping Use   Vaping Use:  Never used  Substance and Sexual Activity   Alcohol use: Yes      Alcohol/week: 7.0 standard drinks      Types: 7 Cans of beer per week      Comment: 24 oz can of beer per day   Drug use: Yes      Types: Cocaine      Comment: pt states he uses crack cocaine at least 2xweek.    Sexual activity: Yes  Other Topics Concern   Not on file  Social History Narrative   Not on file    Social Determinants of Health    Financial Resource Strain: Not on file  Food Insecurity: Not on file  Transportation Needs: Not on file  Physical Activity: Not on file  Stress: Not on file  Social Connections: Not on file  Intimate Partner Violence: Not on file      No Known Allergies         Current Outpatient Medications  Medication Sig Dispense Refill   amLODipine (NORVASC) 10 MG tablet Take 1 tablet (10 mg  total) by mouth daily. 90 tablet 3   aspirin EC 81 MG tablet Take 81 mg by mouth daily.       Cholecalciferol 25 MCG (1000 UT) tablet Take 1,000 Units by mouth daily.       furosemide (LASIX) 20 MG tablet Take 10 mg by mouth daily.       hydrALAZINE (APRESOLINE) 25 MG tablet Take 2 tablets (50 mg total) by mouth every morning. (Patient taking differently: Take 25 mg by mouth in the morning and at bedtime.) 180 tablet 3   isosorbide mononitrate (IMDUR) 30 MG 24 hr tablet Take 1 tablet (30 mg total) by mouth daily. 90 tablet 3   labetalol (NORMODYNE) 100 MG tablet Take 2 tablets (200 mg total) by mouth every morning. 180 tablet 3   rosuvastatin (CRESTOR) 40 MG tablet Take 40 mg by mouth daily.       atorvastatin (LIPITOR) 40 MG tablet Take 1 tablet (40 mg total) by mouth daily. (Patient not taking: Reported on 07/18/2020) 90 tablet 3   HYDROcodone-acetaminophen (NORCO) 5-325 MG tablet Take 1-2 tablets by mouth every 4 (four) hours as needed for severe pain. (Patient not taking: No sig reported) 10 tablet 0   methocarbamol (ROBAXIN) 500 MG tablet Take 1 tablet (500 mg total) by mouth 2 (two) times  daily as needed for muscle spasms. (Patient not taking: No sig reported) 10 tablet 0    No current facility-administered medications for this visit.      REVIEW OF SYSTEMS:  [X]  denotes positive finding, [ ]  denotes negative finding Cardiac   Comments:  Chest pain or chest pressure:      Shortness of breath upon exertion:      Short of breath when lying flat:      Irregular heart rhythm:             Vascular      Pain in calf, thigh, or hip brought on by ambulation: x    Pain in feet at night that wakes you up from your sleep:       Blood clot in your veins:      Leg swelling:              Pulmonary      Oxygen at home:      Productive cough:       Wheezing:              Neurologic      Sudden weakness in arms or legs:       Sudden numbness in arms or legs:       Sudden onset of difficulty speaking or slurred speech:      Temporary loss of vision in one eye:       Problems with dizziness:  x           Gastrointestinal      Blood in stool:       Vomited blood:              Genitourinary      Burning when urinating:       Blood in urine:             Psychiatric      Major depression:              Hematologic      Bleeding problems:      Problems with blood clotting too easily:  Skin      Rashes or ulcers:             Constitutional      Fever or chills:          PHYSICAL EXAM:    Vitals:    03/26/21 1511  BP: 134/67  Pulse: 64  Resp: 18  Temp: 98.9 F (37.2 C)  TempSrc: Temporal  SpO2: 97%  Weight: 159 lb (72.1 kg)  Height: 5\' 10"  (1.778 m)      GENERAL: The patient is a well-nourished male, in no acute distress. The vital signs are documented above. CARDIAC: There is a regular rate and rhythm.  VASCULAR:  Palpable radial brachial pulses bilateral upper extremities No chest wall implants PULMONARY: No respiratory distress ABDOMEN: Soft and non-tender MUSCULOSKELETAL: There are no major deformities or cyanosis. NEUROLOGIC: No focal  weakness or paresthesias are detected. SKIN: There are no ulcers or rashes noted. PSYCHIATRIC: The patient has a normal affect.   DATA:    Arterial duplex upper extremity shows triphasic waveforms bilaterally with a high bifurcation on the right   Upper extremity vein mapping shows a good basilic vein in the left arm which is his nondominant arm   Assessment/Plan:   65 year old male with stage IV CKD that presents for evaluation of permanent dialysis access.  I discussed plans for placement in the nondominant arm which would be his left arm.  Vein mapping shows usable basilic vein.  I discussed this typically be done in two stages.  I discussed this being done outpatient at [facility].  We discussed steps of surgery.  I discussed risk and benefits including bleeding, infection, risk of steal, failure to mature.  We will get him scheduled today.  I discussed the fistula can take up to 3 months to mature after it is placed for it to be used,   Marty Heck, MD [facility] [place] Office: [phone]

## 2021-04-12 NOTE — Anesthesia Postprocedure Evaluation (Signed)
Anesthesia Post Note  Patient: Ronald Reeves  Procedure(s) Performed: LEFT ARM ARTERIOVENOUS (AV) FISTULA CREATION (Left: Arm Upper)     Patient location during evaluation: PACU Anesthesia Type: Regional Level of consciousness: awake and alert Pain management: pain level controlled Vital Signs Assessment: post-procedure vital signs reviewed and stable Respiratory status: spontaneous breathing, nonlabored ventilation and respiratory function stable Cardiovascular status: blood pressure returned to baseline and stable Postop Assessment: no apparent nausea or vomiting Anesthetic complications: no   No notable events documented.  Last Vitals:  Vitals:   04/12/21 0927 04/12/21 0945  BP: 135/68 138/67  Pulse:  (!) 52  Resp:  15  Temp: (!) 36.1 C   SpO2: 100% 100%    Last Pain:  Vitals:   04/12/21 0945  TempSrc:   PainSc: 0-No pain                 Lynda Rainwater

## 2021-04-12 NOTE — Op Note (Signed)
OPERATIVE NOTE   PROCEDURE: left radiocephaic arteriovenous fistula placement  PRE-OPERATIVE DIAGNOSIS: Stage IV CKD  POST-OPERATIVE DIAGNOSIS: same  SURGEON: Monica Martinez, MD  ASSISTANT(S): Roxy Horseman, PA  ANESTHESIA: regional  ESTIMATED BLOOD LOSS: Minimal  FINDING(S): 1.  Cephalic vein: 3 - 3.5 mm, acceptable 2.  Radial artery: 3 mm, disease free 3.  Venous outflow: palpable thrill  4.  Radial flow: palpable radial pulse  SPECIMEN(S):  none  INDICATIONS:   Ronald Reeves is a 65 y.o. male who presents with stage IV CKD and need for permanent hemodialysis access.  The patient is scheduled for left arm arteriovenous fistula placement.  The patient is aware the risks include but are not limited to: bleeding, infection, steal syndrome, nerve damage, ischemic monomelic neuropathy, failure to mature, and need for additional procedures.  The patient is aware of the risks of the procedure and elects to proceed forward.  DESCRIPTION: After full informed written consent was obtained from the patient, the patient was brought back to the operating room and placed supine upon the operating table.  Prior to induction, the patient received IV antibiotics.   After obtaining adequate anesthesia, the patient was then prepped and draped in the standard fashion for a left arm access procedure.   I turned my attention first to identifying the patient's distal cephalic vein and radial artery.  Using SonoSite guidance, the location of these vessels were marked out on the skin.   These appeared to be of usable caliber.  I made a longitudinal incision at the level of the wrist and dissected through the subcutaneous tissue and fascia to gain exposure of the radial artery.  This was noted to be 3 mm in diameter externally.  This was dissected out proximally and distally and controlled with vessel loops .  I then dissected out the cephalic vein.  This was noted to be 3.5 mm in diameter  externally.  The distal segment of the vein was ligated with a  2-0 silk, and the vein was transected.  The proximal segment was interrogated with serial dilators.  The vein accepted up to a 4 mm dilator without any difficulty.  I then instilled the heparinized saline into the vein and clamped it.  At this point, I reset my exposure of the radial artery.  The patient was given 3,000 units IV heparin.  I then placed the artery under tension proximally and distally.  I made an arteriotomy with a #11 blade, and then I extended the arteriotomy with a Potts scissor.  I injected heparinized saline proximal and distal to this arteriotomy.  The vein was then sewn to the artery in an end-to-side configuration with a running stitch of 6-0 Prolene.  Prior to completing this anastomosis, I allowed the vein and artery to backbleed.  There was no evidence of clot from any vessels.  I completed the anastomosis in the usual fashion and then released all vessel loops and clamps.  When I released the clamps the vein felt pulsatile.  I tried to free up any tension with no improvement.  I then elected to take-down the anastomosis and patient was given another 3,000 units IV heparin.  I then spatulated the cephalic vein more for a wider anastomosis.  I then redid the end to side configuration with 6-0 Prolene.  This time there was a palpable thrill in the venous outflow, and there was a palpable radial pulse.  At this point, I irrigated out the surgical wound.  There was no further active bleeding.  The subcutaneous tissue was reapproximated with a running stitch of 3-0 Vicryl.  The skin was then reapproximated with a running subcuticular stitch of 4-0 Monocryl.  The skin was then cleaned, dried, and reinforced with Dermabond.  The patient tolerated this procedure well.   COMPLICATIONS: None  CONDITION: Stable  Monica Martinez, MD [facility] Office: [place]   04/12/2021, 9:21 AM

## 2021-04-13 ENCOUNTER — Encounter (HOSPITAL_COMMUNITY): Payer: Self-pay | Admitting: Vascular Surgery

## 2021-04-17 ENCOUNTER — Telehealth: Payer: Self-pay

## 2021-04-17 NOTE — Telephone Encounter (Signed)
Patient had left AVF on 9/2 by Renfrow. He calls today to report that arm is swollen, numb, and painful. Sometimes it hurts/tingles from his shoulder to his fingers. He is able to move his fingers but is having difficulty grasping items. Says the incision looks okay. Advised him to continue elevation and using the hand and placed him on schedule tomorrow for evaluation.

## 2021-04-18 ENCOUNTER — Ambulatory Visit (INDEPENDENT_AMBULATORY_CARE_PROVIDER_SITE_OTHER): Payer: No Typology Code available for payment source | Admitting: Physician Assistant

## 2021-04-18 ENCOUNTER — Other Ambulatory Visit: Payer: Self-pay

## 2021-04-18 VITALS — BP 148/66 | HR 78 | Temp 98.0°F | Resp 14 | Ht 70.0 in | Wt 167.0 lb

## 2021-04-18 DIAGNOSIS — N184 Chronic kidney disease, stage 4 (severe): Secondary | ICD-10-CM

## 2021-04-18 NOTE — Progress Notes (Signed)
  POST OPERATIVE OFFICE NOTE    CC:  F/u for surgery  HPI:  This is a 65 y.o. male who is s/p left forearm AV fistula creation on 04/12/21 by Dr. Carlis Abbott.    Pt returns today for follow up.  Pt states he has had left shoulder pain with numbness.   He has a history of cervical fusion and shoulder injury on the left side.  He denise pain in the hand, loss of motor and coolness of the extremity.  No Known Allergies  Current Outpatient Medications  Medication Sig Dispense Refill   amLODipine (NORVASC) 10 MG tablet Take 1 tablet (10 mg total) by mouth daily. 90 tablet 3   aspirin EC 81 MG tablet Take 81 mg by mouth daily.     atorvastatin (LIPITOR) 40 MG tablet Take 1 tablet (40 mg total) by mouth daily. (Patient not taking: Reported on 07/18/2020) 90 tablet 3   Cholecalciferol 25 MCG (1000 UT) tablet Take 1,000 Units by mouth daily.     diclofenac Sodium (VOLTAREN) 1 % GEL Apply 2 g topically 3 (three) times daily as needed (pain).     furosemide (LASIX) 20 MG tablet Take 10 mg by mouth daily as needed for fluid or edema.     hydrALAZINE (APRESOLINE) 25 MG tablet Take 2 tablets (50 mg total) by mouth every morning. (Patient taking differently: Take 25 mg by mouth in the morning and at bedtime.) 180 tablet 3   isosorbide mononitrate (IMDUR) 30 MG 24 hr tablet Take 1 tablet (30 mg total) by mouth daily. 90 tablet 3   labetalol (NORMODYNE) 100 MG tablet Take 2 tablets (200 mg total) by mouth every morning. (Patient taking differently: Take 100 mg by mouth 2 (two) times daily.) 180 tablet 3   methocarbamol (ROBAXIN) 500 MG tablet Take 1 tablet (500 mg total) by mouth 2 (two) times daily as needed for muscle spasms. (Patient not taking: No sig reported) 10 tablet 0   Nutritional Supplements (ENSURE CLEAR) LIQD Take 1 Bottle by mouth in the morning and at bedtime.     oxyCODONE-acetaminophen (PERCOCET/ROXICET) 5-325 MG tablet Take 1 tablet by mouth every 6 (six) hours as needed. 12 tablet 0   rosuvastatin  (CRESTOR) 40 MG tablet Take 40 mg by mouth daily.     tamsulosin (FLOMAX) 0.4 MG CAPS capsule Take 0.4 mg by mouth daily.     No current facility-administered medications for this visit.     ROS:  See HPI  Physical Exam:    Incision:  well healed Extremities:  palpable radial pulse, palpable thrill in the fistula.  Doppler brisk ulnar and palmer signal.  No sign of ischemia or muscle waisting.  He has decreased sensation in the median nerve distribution    Assessment/Plan:  This is a 65 y.o. male who is s/p: s/p left forearm AV fistula creation on 04/12/21 by Dr. Carlis Abbott.    Based on his examine and his symptoms I do not think these are steal symptoms.   I gave him a squeeze ball to to help with fistula maturity and he will f/u at 4-6 weeks for duplex.   Roxy Horseman PA-C [facility] [phone]   Clinic MD:  Scot Dock

## 2021-05-09 ENCOUNTER — Other Ambulatory Visit: Payer: Self-pay

## 2021-05-09 DIAGNOSIS — N189 Chronic kidney disease, unspecified: Secondary | ICD-10-CM

## 2021-05-21 ENCOUNTER — Ambulatory Visit (INDEPENDENT_AMBULATORY_CARE_PROVIDER_SITE_OTHER): Payer: No Typology Code available for payment source | Admitting: Physician Assistant

## 2021-05-21 ENCOUNTER — Other Ambulatory Visit: Payer: Self-pay

## 2021-05-21 ENCOUNTER — Ambulatory Visit (HOSPITAL_COMMUNITY)
Admission: RE | Admit: 2021-05-21 | Discharge: 2021-05-21 | Disposition: A | Discharge status: Home / Self Care | Payer: No Typology Code available for payment source | Source: Ambulatory Visit | Attending: Vascular Surgery | Admitting: Vascular Surgery

## 2021-05-21 VITALS — BP 135/68 | HR 66 | Temp 98.2°F | Resp 16 | Ht 70.0 in | Wt 157.0 lb

## 2021-05-21 DIAGNOSIS — N189 Chronic kidney disease, unspecified: Secondary | ICD-10-CM | POA: Diagnosis present

## 2021-05-21 DIAGNOSIS — N184 Chronic kidney disease, stage 4 (severe): Secondary | ICD-10-CM

## 2021-05-21 NOTE — Progress Notes (Signed)
POST OPERATIVE DIALYSIS ACCESS OFFICE NOTE    CC:  F/u for dialysis access surgery  HPI:  This is a 65 y.o. male who is s/p left forearm radiocephalic AV fistula on April 12, 2021 by Dr. Carlis Abbott.  He presents today for routine follow-up.  He has a history of cervical fusion and left shoulder injury.  This has given him chronic tingling in his left fingers.  He denies left hand pain.  He is not yet requiring dialysis.   No Known Allergies  Current Outpatient Medications  Medication Sig Dispense Refill   amLODipine (NORVASC) 10 MG tablet Take 1 tablet (10 mg total) by mouth daily. 90 tablet 3   aspirin EC 81 MG tablet Take 81 mg by mouth daily.     calcitRIOL (ROCALTROL) 0.5 MCG capsule TAKE ONE CAPSULE BY MOUTH DAILY - STOP CHOLECALCIFEROL(VITAMIN D3)     Cholecalciferol 25 MCG (1000 UT) tablet Take 1,000 Units by mouth daily.     diclofenac Sodium (VOLTAREN) 1 % GEL Apply 2 g topically 3 (three) times daily as needed (pain).     furosemide (LASIX) 20 MG tablet Take 10 mg by mouth daily as needed for fluid or edema.     hydrALAZINE (APRESOLINE) 25 MG tablet Take 2 tablets (50 mg total) by mouth every morning. (Patient taking differently: Take 25 mg by mouth in the morning and at bedtime.) 180 tablet 3   isosorbide mononitrate (IMDUR) 30 MG 24 hr tablet Take 1 tablet (30 mg total) by mouth daily. 90 tablet 3   labetalol (NORMODYNE) 100 MG tablet Take 2 tablets (200 mg total) by mouth every morning. (Patient taking differently: Take 100 mg by mouth 2 (two) times daily.) 180 tablet 3   methocarbamol (ROBAXIN) 500 MG tablet Take 1 tablet (500 mg total) by mouth 2 (two) times daily as needed for muscle spasms. 10 tablet 0   Nutritional Supplements (ENSURE CLEAR) LIQD Take 1 Bottle by mouth in the morning and at bedtime.     rosuvastatin (CRESTOR) 40 MG tablet Take 40 mg by mouth daily.     tamsulosin (FLOMAX) 0.4 MG CAPS capsule Take 0.4 mg by mouth daily.     atorvastatin (LIPITOR) 40 MG  tablet Take 1 tablet (40 mg total) by mouth daily. (Patient not taking: Reported on 07/18/2020) 90 tablet 3   No current facility-administered medications for this visit.     ROS:  See HPI  BP 135/68 (BP Location: Right Arm, Patient Position: Sitting, Cuff Size: Normal)   Pulse 66   Temp 98.2 F (36.8 C) (Temporal)   Resp 16   Ht 5\' 10"  (1.778 m)   Wt 157 lb (71.2 kg)   SpO2 99%   BMI 22.53 kg/m    Physical Exam:  General appearance: awake, alert in NAD Respirations: unlabored; no dyspnea at rest Left upper extremity: Hand is warm with 4/5 grip strength. Motor function and sensation intact. Good bruit and thrill in fistula. 2+ radial pulse. Incision(s): Well approximated.   Dialysis duplex on 05/21/2021 Left AV fistula.  Depth of fistula averages 3 mm.  Average diameter is approximately 3.5  mm.  Assessment/Plan:   -pt does not have evidence of steal syndrome -dialysis duplex today reveals fistula to be of adequate depth for access, however diameter is still below 6 mm.  He is given an exercise ball and instructed on use. As he has not yet requiring hemodialysis, I will bring him back in 5 weeks for repeat duplex.  He  may require fistulogram with balloon angioplasty versus ligation of competing branches.  Barbie Banner, PA-C 05/21/2021 2:44 PM [facility] [phone]  Clinic MD: Dr. [name]

## 2021-05-22 ENCOUNTER — Other Ambulatory Visit: Payer: Self-pay

## 2021-05-22 DIAGNOSIS — N184 Chronic kidney disease, stage 4 (severe): Secondary | ICD-10-CM

## 2021-06-25 ENCOUNTER — Ambulatory Visit (HOSPITAL_COMMUNITY)
Admission: RE | Admit: 2021-06-25 | Discharge: 2021-06-25 | Disposition: A | Discharge status: Home / Self Care | Payer: No Typology Code available for payment source | Source: Ambulatory Visit | Attending: Vascular Surgery | Admitting: Vascular Surgery

## 2021-06-25 ENCOUNTER — Other Ambulatory Visit: Payer: Self-pay

## 2021-06-25 ENCOUNTER — Ambulatory Visit (INDEPENDENT_AMBULATORY_CARE_PROVIDER_SITE_OTHER): Payer: No Typology Code available for payment source | Admitting: Physician Assistant

## 2021-06-25 VITALS — BP 135/58 | HR 68 | Temp 98.6°F | Resp 16 | Ht 70.0 in | Wt 154.2 lb

## 2021-06-25 DIAGNOSIS — N184 Chronic kidney disease, stage 4 (severe): Secondary | ICD-10-CM | POA: Diagnosis not present

## 2021-06-25 NOTE — Progress Notes (Signed)
Established Dialysis Access   History of Present Illness   Ronald Reeves is a 65 y.o. (Jun 28, 1956) male who presents for follow up of left forearm radiocephalic AV fistula. This was created by Dr. Carlis Abbott on 04/12/21. On follow up he has not had any signs or symptoms of steal. He had some tingling in his left fingers secondary to cervical spine fusion and left shoulder surgery. His duplex showed adequate depth of his fistula but fistula had not fully matured. He was not yet initiated on dialysis. He was instructed to exercise his left upper arm and follow up in 1 month.   He presents today with repeat fistula duplex. He continues to have tingling in his left fingers. He says it is a little worse since the fistula was created. His fingers also tend to get cold easily. He also reports intermittent stabbing pains in area of incision in left forearm. He has been exercising his left arm. He is not currently on Hemodialysis.   The patient's PMH, PSH, SH, and FamHx were reviewed and are unchanged from his last visit on 05/21/21.  Current Outpatient Medications  Medication Sig Dispense Refill   amLODipine (NORVASC) 10 MG tablet Take 1 tablet (10 mg total) by mouth daily. 90 tablet 3   aspirin EC 81 MG tablet Take 81 mg by mouth daily.     calcitRIOL (ROCALTROL) 0.5 MCG capsule TAKE ONE CAPSULE BY MOUTH DAILY - STOP CHOLECALCIFEROL(VITAMIN D3)     Cholecalciferol 25 MCG (1000 UT) tablet Take 1,000 Units by mouth daily.     diclofenac Sodium (VOLTAREN) 1 % GEL Apply 2 g topically 3 (three) times daily as needed (pain).     furosemide (LASIX) 20 MG tablet Take 10 mg by mouth daily as needed for fluid or edema.     isosorbide mononitrate (IMDUR) 30 MG 24 hr tablet Take 1 tablet (30 mg total) by mouth daily. 90 tablet 3   labetalol (NORMODYNE) 100 MG tablet Take 2 tablets (200 mg total) by mouth every morning. (Patient taking differently: Take 100 mg by mouth 2 (two) times daily.) 180 tablet 3   methocarbamol  (ROBAXIN) 500 MG tablet Take 1 tablet (500 mg total) by mouth 2 (two) times daily as needed for muscle spasms. 10 tablet 0   Nutritional Supplements (ENSURE CLEAR) LIQD Take 1 Bottle by mouth in the morning and at bedtime.     rosuvastatin (CRESTOR) 40 MG tablet Take 40 mg by mouth daily.     tamsulosin (FLOMAX) 0.4 MG CAPS capsule Take 0.4 mg by mouth daily.     atorvastatin (LIPITOR) 40 MG tablet Take 1 tablet (40 mg total) by mouth daily. (Patient not taking: Reported on 07/18/2020) 90 tablet 3   hydrALAZINE (APRESOLINE) 25 MG tablet Take 2 tablets (50 mg total) by mouth every morning. (Patient not taking: Reported on 06/25/2021) 180 tablet 3   No current facility-administered medications for this visit.    On ROS today: Cervical neck and bilateral shoulder pain   Physical Examination   Vitals:   06/25/21 1433  BP: (!) 135/58  Pulse: 68  Resp: 16  Temp: 98.6 F (37 C)  TempSrc: Oral  SpO2: 99%  Weight: 154 lb 3.2 oz (69.9 kg)  Height: 5\' 10"  (1.778 m)   Body mass index is 22.13 kg/m.  General Alert, O x 3, WD, NAD  Pulmonary Non labored  Cardiac Regular rate and rhythm   Vascular Vessel Right Left  Radial Palpable Palpable  Brachial  Palpable Palpable  Ulnar Not palpable Not palpable    Musculo- skeletal Fistula is easily palpable in left forearm, good thrill. Can see competing branches in left forearm. Left hand warm M/S 5/5 throughout  , Extremities without ischemic changes    Neurologic A&O; CN grossly intact     Non-invasive Vascular Imaging   left Arm Access Duplex  (06/25/21):  Diameters:  0.33-.44 mm Depth:  <0.39 mm PSV:  381 c/s   Medical Decision Making   Hero Kulish is a 65 y.o. male who presents for follow up of left radiocephalic AV fistula. At his last visit fistula had not fully matured. Today he has some numbness and tingling in left fingers. Intermittent pains. I think these are both somewhat related to his fistula but also neuropathic in nature  from his cervical fusion and shoulder surgeries. His left upper extremity is well perfused with easily palpable radial pulse and warm hand. The repeat fistula duplex today shows no further maturation of the fistula. He has two competing branches identified on duplex. I have recommended branch ligation in the OR to hopefully improve maturation of this fistula. Will arrange this with Dr. Carlis Abbott in the near future.   Karoline Caldwell, PA-C [facility] [place] Office: [phone]  Clinic MD: Roxanne Mins

## 2021-07-02 ENCOUNTER — Other Ambulatory Visit: Payer: Self-pay

## 2021-07-12 ENCOUNTER — Encounter (HOSPITAL_COMMUNITY): Payer: Self-pay | Admitting: Vascular Surgery

## 2021-07-12 NOTE — Progress Notes (Addendum)
Mr. Maynez denies chest pain or shortness of breath. Patient denies having any s/s of Covid in his household.  Patient denies any known exposure to Covid.   Mr. Keeven is not on dialysis at this time.  I instructed patient to shower with antibiotic soap, if it is available.  Dry off with a clean towel. Do not put lotion, powder, cologne or deodorant or makeup.No jewelry or piercings. Men may shave their face and neck. Woman should not shave. No nail polish, artificial or acrylic nails. Wear clean clothes, brush your teeth. Glasses, contact lens,dentures or partials may not be worn in the OR. If you need to wear them, please bring a case for glasses, do not wear contacts or bring a case, the hospital does not have contact cases, dentures or partials will have to be removed , make sure they are clean, we will provide a denture cup to put them in. You will need some one to drive you home and a responsible person over the age of 30 to stay with you for the first 24 hours after surgery.   PCP is Dr Odella Aquas- Osval[facility] Human.

## 2021-07-15 ENCOUNTER — Ambulatory Visit (HOSPITAL_COMMUNITY): Payer: No Typology Code available for payment source | Admitting: Anesthesiology

## 2021-07-15 ENCOUNTER — Encounter (HOSPITAL_COMMUNITY)
Admission: RE | Disposition: A | Discharge status: Home / Self Care | Payer: Self-pay | Source: Home / Self Care | Attending: Vascular Surgery

## 2021-07-15 ENCOUNTER — Encounter (HOSPITAL_COMMUNITY): Payer: Self-pay | Admitting: Vascular Surgery

## 2021-07-15 ENCOUNTER — Other Ambulatory Visit: Payer: Self-pay

## 2021-07-15 ENCOUNTER — Ambulatory Visit (HOSPITAL_COMMUNITY)
Admission: RE | Admit: 2021-07-15 | Discharge: 2021-07-15 | Disposition: A | Discharge status: Home / Self Care | Payer: No Typology Code available for payment source | Attending: Vascular Surgery | Admitting: Vascular Surgery

## 2021-07-15 DIAGNOSIS — Y832 Surgical operation with anastomosis, bypass or graft as the cause of abnormal reaction of the patient, or of later complication, without mention of misadventure at the time of the procedure: Secondary | ICD-10-CM | POA: Diagnosis not present

## 2021-07-15 DIAGNOSIS — N186 End stage renal disease: Secondary | ICD-10-CM | POA: Insufficient documentation

## 2021-07-15 DIAGNOSIS — T82510A Breakdown (mechanical) of surgically created arteriovenous fistula, initial encounter: Secondary | ICD-10-CM | POA: Diagnosis present

## 2021-07-15 DIAGNOSIS — T82898A Other specified complication of vascular prosthetic devices, implants and grafts, initial encounter: Secondary | ICD-10-CM

## 2021-07-15 HISTORY — DX: Prediabetes: R73.03

## 2021-07-15 HISTORY — PX: LIGATION OF COMPETING BRANCHES OF ARTERIOVENOUS FISTULA: SHX5949

## 2021-07-15 HISTORY — PX: REVISON OF ARTERIOVENOUS FISTULA: SHX6074

## 2021-07-15 HISTORY — DX: Cardiac murmur, unspecified: R01.1

## 2021-07-15 HISTORY — DX: Other psychoactive substance abuse, uncomplicated: F19.10

## 2021-07-15 LAB — POCT I-STAT, CHEM 8
BUN: 44 mg/dL — ABNORMAL HIGH (ref 8–23)
Calcium, Ion: 1.07 mmol/L — ABNORMAL LOW (ref 1.15–1.40)
Chloride: 112 mmol/L — ABNORMAL HIGH (ref 98–111)
Creatinine, Ser: 5 mg/dL — ABNORMAL HIGH (ref 0.61–1.24)
Glucose, Bld: 120 mg/dL — ABNORMAL HIGH (ref 70–99)
HCT: 38 % — ABNORMAL LOW (ref 39.0–52.0)
Hemoglobin: 12.9 g/dL — ABNORMAL LOW (ref 13.0–17.0)
Potassium: 4.7 mmol/L (ref 3.5–5.1)
Sodium: 142 mmol/L (ref 135–145)
TCO2: 20 mmol/L — ABNORMAL LOW (ref 22–32)

## 2021-07-15 LAB — GLUCOSE, CAPILLARY: Glucose-Capillary: 137 mg/dL — ABNORMAL HIGH (ref 70–99)

## 2021-07-15 SURGERY — LIGATION OF COMPETING BRANCHES OF ARTERIOVENOUS FISTULA
Anesthesia: Monitor Anesthesia Care | Site: Arm Lower | Laterality: Left

## 2021-07-15 MED ORDER — CEFAZOLIN SODIUM-DEXTROSE 2-4 GM/100ML-% IV SOLN
2.0000 g | INTRAVENOUS | Status: AC
  Administered 2021-07-15: 2 g via INTRAVENOUS
  Filled 2021-07-15: qty 100

## 2021-07-15 MED ORDER — CHLORHEXIDINE GLUCONATE 4 % EX LIQD: 60.0000 mL | Freq: Once | CUTANEOUS | Status: DC

## 2021-07-15 MED ORDER — HYDROCODONE-ACETAMINOPHEN 5-325 MG PO TABS: 1.0000 | ORAL_TABLET | Freq: Four times a day (QID) | ORAL | 0 refills | Status: DC | PRN

## 2021-07-15 MED ORDER — LIDOCAINE HCL 1 % IJ SOLN: INTRAMUSCULAR | Status: DC | PRN

## 2021-07-15 MED ORDER — MEPIVACAINE HCL (PF) 2 % IJ SOLN
INTRAMUSCULAR | Status: DC | PRN
  Administered 2021-07-15: 20 mL

## 2021-07-15 MED ORDER — FENTANYL CITRATE (PF) 100 MCG/2ML IJ SOLN
INTRAMUSCULAR | Status: AC
  Administered 2021-07-15: 50 ug via INTRAVENOUS
  Filled 2021-07-15: qty 2

## 2021-07-15 MED ORDER — PHENYLEPHRINE HCL-NACL 20-0.9 MG/250ML-% IV SOLN
INTRAVENOUS | Status: DC | PRN
  Administered 2021-07-15: 50 ug/min via INTRAVENOUS

## 2021-07-15 MED ORDER — MIDAZOLAM HCL 2 MG/2ML IJ SOLN
INTRAMUSCULAR | Status: AC
  Administered 2021-07-15: 1 mg via INTRAVENOUS
  Filled 2021-07-15: qty 2

## 2021-07-15 MED ORDER — GLYCOPYRROLATE PF 0.2 MG/ML IJ SOSY
PREFILLED_SYRINGE | INTRAMUSCULAR | Status: DC | PRN
  Administered 2021-07-15: .2 mg via INTRAVENOUS

## 2021-07-15 MED ORDER — SODIUM CHLORIDE 0.9 % IV SOLN: INTRAVENOUS | Status: DC

## 2021-07-15 MED ORDER — HEPARIN 6000 UNIT IRRIGATION SOLUTION
Status: DC | PRN
  Administered 2021-07-15: 1

## 2021-07-15 MED ORDER — PROPOFOL 500 MG/50ML IV EMUL
INTRAVENOUS | Status: DC | PRN
  Administered 2021-07-15: 100 ug/kg/min via INTRAVENOUS

## 2021-07-15 MED ORDER — PROPOFOL 10 MG/ML IV BOLUS
INTRAVENOUS | Status: DC | PRN
  Administered 2021-07-15: 30 mg via INTRAVENOUS
  Administered 2021-07-15: 100 mg via INTRAVENOUS

## 2021-07-15 MED ORDER — LIDOCAINE HCL (PF) 1 % IJ SOLN
INTRAMUSCULAR | Status: AC
  Filled 2021-07-15: qty 30

## 2021-07-15 MED ORDER — FENTANYL CITRATE (PF) 100 MCG/2ML IJ SOLN: 50.0000 ug | Freq: Once | INTRAMUSCULAR | Status: AC

## 2021-07-15 MED ORDER — ACETAMINOPHEN 500 MG PO TABS
1000.0000 mg | ORAL_TABLET | Freq: Once | ORAL | Status: AC
  Administered 2021-07-15: 1000 mg via ORAL
  Filled 2021-07-15: qty 2

## 2021-07-15 MED ORDER — CHLORHEXIDINE GLUCONATE 0.12 % MT SOLN
15.0000 mL | Freq: Once | OROMUCOSAL | Status: AC
  Administered 2021-07-15: 15 mL via OROMUCOSAL
  Filled 2021-07-15: qty 15

## 2021-07-15 MED ORDER — ORAL CARE MOUTH RINSE: 15.0000 mL | Freq: Once | OROMUCOSAL | Status: AC

## 2021-07-15 MED ORDER — MIDAZOLAM HCL 2 MG/2ML IJ SOLN: 1.0000 mg | Freq: Once | INTRAMUSCULAR | Status: AC

## 2021-07-15 MED ORDER — 0.9 % SODIUM CHLORIDE (POUR BTL) OPTIME
TOPICAL | Status: DC | PRN
  Administered 2021-07-15: 1000 mL

## 2021-07-15 MED ORDER — HEPARIN 6000 UNIT IRRIGATION SOLUTION
Status: AC
  Filled 2021-07-15: qty 500

## 2021-07-15 SURGICAL SUPPLY — 37 items
ADH SKN CLS APL DERMABOND .7 (GAUZE/BANDAGES/DRESSINGS) ×1
AGENT HMST SPONGE THK3/8 (HEMOSTASIS)
ARMBAND PINK RESTRICT EXTREMIT (MISCELLANEOUS) ×3 IMPLANT
BAG COUNTER SPONGE SURGICOUNT (BAG) ×2 IMPLANT
BAG SPNG CNTER NS LX DISP (BAG) ×1
BAG SURGICOUNT SPONGE COUNTING (BAG) ×1
CANISTER SUCT 3000ML PPV (MISCELLANEOUS) ×3 IMPLANT
CLIP VESOCCLUDE MED 24/CT (CLIP) ×3 IMPLANT
CLIP VESOCCLUDE SM WIDE 24/CT (CLIP) ×3 IMPLANT
COVER PROBE W GEL 5X96 (DRAPES) ×3 IMPLANT
DERMABOND ADVANCED (GAUZE/BANDAGES/DRESSINGS) ×2
DERMABOND ADVANCED .7 DNX12 (GAUZE/BANDAGES/DRESSINGS) ×1 IMPLANT
ELECT REM PT RETURN 9FT ADLT (ELECTROSURGICAL) ×3
ELECTRODE REM PT RTRN 9FT ADLT (ELECTROSURGICAL) ×1 IMPLANT
GLOVE SRG 8 PF TXTR STRL LF DI (GLOVE) ×1 IMPLANT
GLOVE SURG ENC MOIS LTX SZ7.5 (GLOVE) ×5 IMPLANT
GLOVE SURG MICRO LTX SZ6 (GLOVE) ×2 IMPLANT
GLOVE SURG UNDER POLY LF SZ8 (GLOVE) ×3
GOWN STRL REUS W/ TWL LRG LVL3 (GOWN DISPOSABLE) ×2 IMPLANT
GOWN STRL REUS W/ TWL XL LVL3 (GOWN DISPOSABLE) ×2 IMPLANT
GOWN STRL REUS W/TWL LRG LVL3 (GOWN DISPOSABLE) ×6
GOWN STRL REUS W/TWL XL LVL3 (GOWN DISPOSABLE) ×6
HEMOSTAT SPONGE AVITENE ULTRA (HEMOSTASIS) IMPLANT
KIT BASIN OR (CUSTOM PROCEDURE TRAY) ×3 IMPLANT
KIT TURNOVER KIT B (KITS) ×3 IMPLANT
NS IRRIG 1000ML POUR BTL (IV SOLUTION) ×3 IMPLANT
PACK CV ACCESS (CUSTOM PROCEDURE TRAY) ×3 IMPLANT
PAD ARMBOARD 7.5X6 YLW CONV (MISCELLANEOUS) ×6 IMPLANT
SUT ETHILON 3 0 PS 1 (SUTURE) IMPLANT
SUT MNCRL AB 4-0 PS2 18 (SUTURE) ×6 IMPLANT
SUT PROLENE 6 0 BV (SUTURE) ×2 IMPLANT
SUT SILK 0 TIES 10X30 (SUTURE) ×3 IMPLANT
SUT VIC AB 3-0 SH 27 (SUTURE) ×6
SUT VIC AB 3-0 SH 27X BRD (SUTURE) ×1 IMPLANT
TOWEL GREEN STERILE (TOWEL DISPOSABLE) ×3 IMPLANT
UNDERPAD 30X36 HEAVY ABSORB (UNDERPADS AND DIAPERS) ×3 IMPLANT
WATER STERILE IRR 1000ML POUR (IV SOLUTION) ×3 IMPLANT

## 2021-07-15 NOTE — Anesthesia Preprocedure Evaluation (Signed)
Anesthesia Evaluation  Patient identified by MRN, date of birth, ID band Patient awake    Reviewed: Allergy & Precautions, NPO status , Patient's Chart, lab work & pertinent test results, reviewed documented beta blocker date and time   History of Anesthesia Complications Negative for: history of anesthetic complications  Airway Mallampati: II  TM Distance: >3 FB Neck ROM: Full    Dental  (+) Dental Advisory Given, Poor Dentition, Missing, Chipped   Pulmonary sleep apnea (does not use CPAP) , Current Smoker and Patient abstained from smoking.,    breath sounds clear to auscultation       Cardiovascular hypertension, Pt. on medications and Pt. on home beta blockers (-) angina+ CAD and + Past MI   Rhythm:Regular Rate:Normal  '19 stress: large inferior scar, EF 38%, no reversibility '19 ECHO: EF 45-50%, basal infero-lateral, inferior hypokinesis, mild MR   Neuro/Psych  Neuromuscular disease negative neurological ROS  negative psych ROS   GI/Hepatic Neg liver ROS, GERD  Controlled,  Endo/Other  negative endocrine ROS  Renal/GU ESRFRenal disease     Musculoskeletal  (+) Arthritis ,   Abdominal   Peds  Hematology negative hematology ROS (+) anemia ,   Anesthesia Other Findings Day of surgery medications reviewed with the patient.  Reproductive/Obstetrics                             Anesthesia Physical  Anesthesia Plan  ASA: 3  Anesthesia Plan: Regional and MAC   Post-op Pain Management: Regional block   Induction: Intravenous  PONV Risk Score and Plan: 0 and Treatment may vary due to age or medical condition and Propofol infusion  Airway Management Planned: Natural Airway and Simple Face Mask  Additional Equipment: None  Intra-op Plan:   Post-operative Plan:   Informed Consent: I have reviewed the patients History and Physical, chart, labs and discussed the procedure including  the risks, benefits and alternatives for the proposed anesthesia with the patient or authorized representative who has indicated his/her understanding and acceptance.     Dental advisory given  Plan Discussed with: CRNA and Surgeon  Anesthesia Plan Comments:         Anesthesia Quick Evaluation

## 2021-07-15 NOTE — Anesthesia Procedure Notes (Addendum)
Anesthesia Regional Block: Supraclavicular block   Pre-Anesthetic Checklist: , timeout performed,  Correct Patient, Correct Site, Correct Laterality,  Correct Procedure, Correct Position, site marked,  Risks and benefits discussed,  Surgical consent,  Pre-op evaluation,  At surgeon's request and post-op pain management  Laterality: Left  Prep: chloraprep       Needles:  Injection technique: Single-shot  Needle Type: Echogenic Needle     Needle Length: 9cm  Needle Gauge: 21     Additional Needles:   Procedures:,,,, ultrasound used (permanent image in chart),,    Narrative:  Start time: 07/15/2021 9:37 AM End time: 07/15/2021 9:43 AM Injection made incrementally with aspirations every 5 mL.  Performed by: Personally  Anesthesiologist: Catalina Gravel, MD  Additional Notes: No pain on injection. No increased resistance to injection. Injection made in 5cc increments.  Good needle visualization.  Patient tolerated procedure well.

## 2021-07-15 NOTE — Transfer of Care (Signed)
Immediate Anesthesia Transfer of Care Note  Patient: Ronald Reeves  Procedure(s) Performed: LIGATION OF COMPETING BRANCHES OF LEFT RADIOCEPHALIC ARTERIOVENOUS FISTULA TIMES TWO (Left: Arm Lower) REVISON OF LEFT ARTERIOVENOUS FISTULA (Left: Arm Lower)  Patient Location: PACU  Anesthesia Type:MAC combined with regional for post-op pain  Level of Consciousness: awake and alert   Airway & Oxygen Therapy: Patient Spontanous Breathing and Patient connected to nasal cannula oxygen  Post-op Assessment: Report given to RN and Post -op Vital signs reviewed and stable  Post vital signs: Reviewed and stable  Last Vitals:  Vitals Value Taken Time  BP    Temp    Pulse 63 07/15/21 1159  Resp 15 07/15/21 1159  SpO2 100 % 07/15/21 1159  Vitals shown include unvalidated device data.  Last Pain:  Vitals:   07/15/21 0851  TempSrc:   PainSc: 0-No pain         Complications: No notable events documented.

## 2021-07-15 NOTE — Discharge Instructions (Signed)
[facility]  Discharge Instructions  AV Fistula or Graft Surgery for Dialysis Access  Please refer to the following instructions for your post-procedure care. Your surgeon or physician assistant will discuss any changes with you.  Activity  You may drive the day following your surgery, if you are comfortable and no longer taking prescription pain medication. Resume full activity as the soreness in your incision resolves.  Bathing/Showering  You may shower after you go home. Keep your incision dry for 48 hours. Do not soak in a bathtub, hot tub, or swim until the incision heals completely. You may not shower if you have a hemodialysis catheter.  Incision Care  Clean your incision with mild soap and water after 48 hours. Pat the area dry with a clean towel. You do not need a bandage unless otherwise instructed. Do not apply any ointments or creams to your incision. You may have skin glue on your incision. Do not peel it off. It will come off on its own in about one week. Your arm may swell a bit after surgery. To reduce swelling use pillows to elevate your arm so it is above your heart. Your doctor will tell you if you need to lightly wrap your arm with an ACE bandage.  Diet  Resume your normal diet. There are not special food restrictions following this procedure. In order to heal from your surgery, it is CRITICAL to get adequate nutrition. Your body requires vitamins, minerals, and protein. Vegetables are the best source of vitamins and minerals. Vegetables also provide the perfect balance of protein. Processed food has little nutritional value, so try to avoid this.  Medications  Resume taking all of your medications. If your incision is causing pain, you may take over-the counter pain relievers such as acetaminophen (Tylenol). If you were prescribed a stronger pain medication, please be aware these medications can cause nausea and constipation. Prevent  nausea by taking the medication with a snack or meal. Avoid constipation by drinking plenty of fluids and eating foods with high amount of fiber, such as fruits, vegetables, and grains.  Do not take Tylenol if you are taking prescription pain medications.  Follow up Your surgeon may want to see you in the office following your access surgery. If so, this will be arranged at the time of your surgery.  Please call us immediately for any of the following conditions:  Increased pain, redness, drainage (pus) from your incision site Fever of 101 degrees or higher Severe or worsening pain at your incision site Hand pain or numbness.  Reduce your risk of vascular disease:  Stop smoking. If you would like help, call [facility] at 1-800-QUIT-NOW (559)297-0248) or [place] at [place] your cholesterol Maintain a desired weight Control your diabetes Keep your blood pressure down  Dialysis  It will take several weeks to several months for your new dialysis access to be ready for use. Your surgeon will determine when it is okay to use it. Your nephrologist will continue to direct your dialysis. You can continue to use your Permcath until your new access is ready for use.   07/15/2021 Ronald Reeves [id number] 05/25/1956  Surgeon(s): Marty Heck, MD  Procedure(s): LIGATION OF COMPETING BRANCHES OF LEFT RADIOCEPHALIC ARTERIOVENOUS FISTULA TIMES TWO REVISON OF LEFT ARTERIOVENOUS FISTULA   May stick graft immediately   May stick graft on designated area only:   x Do not stick 4weeks    If you have any questions,  please call the office at [phone].

## 2021-07-15 NOTE — Op Note (Signed)
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 Marty Heck, MD  Assistant: OR staff  Indications: Patient is a 65 year old male who has previously undergone a left radiocephalic AV fistula on 10/17/4663.  He was seen on follow-up and has a small cephalic vein that is only about 3 to 4 mm and has not matured adequately.  He was noted to have multiple sidebranches.  He presents today for sidebranch ligation of the forearm fistula after risks and benefits discussed.  Findings: Left radiocephalic AV fistula was evaluated with ultrasound and two sidebranches were found in the forearm.  One was quite large and the other was smaller.  Both of these were ligated and divided.  Good thrill at completion.  Anesthesia: Regional block  Details: Patient was taken the operating room after informed consent was obtained.  Placed on the operative table in supine position.  After anesthesia induced, the left arm was prepped draped in the standard sterile fashion.  Timeout was performed.  Antibiotics were given.  I evaluated the left radiocephalic AV fistula with ultrasound.  I found two side branches that were marked over the course of the cephalic vein in the left forearm.  I then made two small longitudinal incisions over each of the side branches and the side branches were dissected out and ligated between right angle clamps with 3-0 silk ties and divided.  Good thrill at completion.  I looked again with ultrasound and I did not see any other branches in the forearm up to the antecubital fossa.  Both incisions were irrigated out.  These were closed with 4-0 Monocryl and Dermabond.  Taken to PACU in stable condition.  Complication: None  Condition: Stable  Marty Heck, MD [facility] [place] Office:  [place]

## 2021-07-15 NOTE — H&P (Signed)
History and Physical Interval Note:  07/15/2021 10:08 AM  Ronald Reeves  has presented today for surgery, with the diagnosis of ESRD.  The various methods of treatment have been discussed with the patient and family. After consideration of risks, benefits and other options for treatment, the patient has consented to  Procedure(s): LIGATION OF COMPETING BRANCHES OF LEFT RADIOCEPHALIC ARTERIOVENOUS FISTULA (Left) as a surgical intervention.  The patient's history has been reviewed, patient examined, no change in status, stable for surgery.  I have reviewed the patient's chart and labs.  Questions were answered to the patient's satisfaction.     Marty Heck  Established Dialysis Access     History of Present Illness    Ronald Reeves is a 65 y.o. (15-Nov-1955) male who presents for follow up of left forearm radiocephalic AV fistula. This was created by Dr. Carlis Abbott on 04/12/21. On follow up he has not had any signs or symptoms of steal. He had some tingling in his left fingers secondary to cervical spine fusion and left shoulder surgery. His duplex showed adequate depth of his fistula but fistula had not fully matured. He was not yet initiated on dialysis. He was instructed to exercise his left upper arm and follow up in 1 month.    He presents today with repeat fistula duplex. He continues to have tingling in his left fingers. He says it is a little worse since the fistula was created. His fingers also tend to get cold easily. He also reports intermittent stabbing pains in area of incision in left forearm. He has been exercising his left arm. He is not currently on Hemodialysis.    The patient's PMH, PSH, SH, and FamHx were reviewed and are unchanged from his last visit on 05/21/21.         Current Outpatient Medications  Medication Sig Dispense Refill   amLODipine (NORVASC) 10 MG tablet Take 1 tablet (10 mg total) by mouth daily. 90 tablet 3   aspirin EC 81 MG tablet Take 81 mg by mouth daily.        calcitRIOL (ROCALTROL) 0.5 MCG capsule TAKE ONE CAPSULE BY MOUTH DAILY - STOP CHOLECALCIFEROL(VITAMIN D3)       Cholecalciferol 25 MCG (1000 UT) tablet Take 1,000 Units by mouth daily.       diclofenac Sodium (VOLTAREN) 1 % GEL Apply 2 g topically 3 (three) times daily as needed (pain).       furosemide (LASIX) 20 MG tablet Take 10 mg by mouth daily as needed for fluid or edema.       isosorbide mononitrate (IMDUR) 30 MG 24 hr tablet Take 1 tablet (30 mg total) by mouth daily. 90 tablet 3   labetalol (NORMODYNE) 100 MG tablet Take 2 tablets (200 mg total) by mouth every morning. (Patient taking differently: Take 100 mg by mouth 2 (two) times daily.) 180 tablet 3   methocarbamol (ROBAXIN) 500 MG tablet Take 1 tablet (500 mg total) by mouth 2 (two) times daily as needed for muscle spasms. 10 tablet 0   Nutritional Supplements (ENSURE CLEAR) LIQD Take 1 Bottle by mouth in the morning and at bedtime.       rosuvastatin (CRESTOR) 40 MG tablet Take 40 mg by mouth daily.       tamsulosin (FLOMAX) 0.4 MG CAPS capsule Take 0.4 mg by mouth daily.       atorvastatin (LIPITOR) 40 MG tablet Take 1 tablet (40 mg total) by mouth daily. (Patient not taking: Reported on 07/18/2020)  90 tablet 3   hydrALAZINE (APRESOLINE) 25 MG tablet Take 2 tablets (50 mg total) by mouth every morning. (Patient not taking: Reported on 06/25/2021) 180 tablet 3    No current facility-administered medications for this visit.      On ROS today: Cervical neck and bilateral shoulder pain     Physical Examination       Vitals:    06/25/21 1433  BP: (!) 135/58  Pulse: 68  Resp: 16  Temp: 98.6 F (37 C)  TempSrc: Oral  SpO2: 99%  Weight: 154 lb 3.2 oz (69.9 kg)  Height: 5\' 10"  (1.778 m)    Body mass index is 22.13 kg/m.   General Alert, O x 3, WD, NAD  Pulmonary Non labored  Cardiac Regular rate and rhythm   Vascular Vessel Right Left  Radial Palpable Palpable  Brachial Palpable Palpable  Ulnar Not palpable Not  palpable    Musculo- skeletal Fistula is easily palpable in left forearm, good thrill. Can see competing branches in left forearm. Left hand warm M/S 5/5 throughout  , Extremities without ischemic changes    Neurologic A&O; CN grossly intact        Non-invasive Vascular Imaging    left Arm Access Duplex  (06/25/21):  Diameters:  0.33-.44 mm Depth:  <0.39 mm PSV:  381 c/s     Medical Decision Making    Ronald Reeves is a 65 y.o. male who presents for follow up of left radiocephalic AV fistula. At his last visit fistula had not fully matured. Today he has some numbness and tingling in left fingers. Intermittent pains. I think these are both somewhat related to his fistula but also neuropathic in nature from his cervical fusion and shoulder surgeries. His left upper extremity is well perfused with easily palpable radial pulse and warm hand. The repeat fistula duplex today shows no further maturation of the fistula. He has two competing branches identified on duplex. I have recommended branch ligation in the OR to hopefully improve maturation of this fistula. Will arrange this with Dr. Carlis Abbott in the near future.    Karoline Caldwell, PA-C [facility] [place] Office: [phone]   Clinic MD: Roxanne Mins

## 2021-07-15 NOTE — Anesthesia Postprocedure Evaluation (Signed)
Anesthesia Post Note  Patient: Ronald Reeves  Procedure(s) Performed: LIGATION OF COMPETING BRANCHES OF LEFT RADIOCEPHALIC ARTERIOVENOUS FISTULA TIMES TWO (Left: Arm Lower) REVISON OF LEFT ARTERIOVENOUS FISTULA (Left: Arm Lower)     Patient location during evaluation: PACU Anesthesia Type: Regional Level of consciousness: awake and alert, awake and oriented Pain management: pain level controlled Vital Signs Assessment: post-procedure vital signs reviewed and stable Respiratory status: spontaneous breathing, nonlabored ventilation and respiratory function stable Cardiovascular status: stable and blood pressure returned to baseline Postop Assessment: no apparent nausea or vomiting Anesthetic complications: no   No notable events documented.  Last Vitals:  Vitals:   07/15/21 1230 07/15/21 1245  BP: (!) 166/87 (!) 163/98  Pulse: 63 74  Resp: 18 18  Temp: (!) 35.9 C   SpO2: 100% 100%    Last Pain:  Vitals:   07/15/21 1230  TempSrc:   PainSc: 0-No pain                 Catalina Gravel

## 2021-07-16 ENCOUNTER — Encounter (HOSPITAL_COMMUNITY): Payer: Self-pay | Admitting: Vascular Surgery

## 2021-07-22 ENCOUNTER — Other Ambulatory Visit: Payer: Self-pay | Admitting: Physician Assistant

## 2021-07-22 ENCOUNTER — Telehealth: Payer: Self-pay

## 2021-07-22 NOTE — Telephone Encounter (Signed)
See encounter note.

## 2021-07-22 NOTE — Progress Notes (Signed)
Patient called to report not getting his post operative pain medication because the [facility] sent it to the hospital instead of his house. While I was attempting to get further information, the phone cut out. Patient then walked in to the clinic. He had surgery on 12/5 and hydrocodone was sent to the [facility]. He states he went to the [facility] in person last Tuesday as well as called them this morning. Says they told him it was sent to [address] and he would have to file a police report. They also apparently told him to ask what we could do about it. I called the [facility] and was told I could not speak directly to the pharmacy, but just the main line. They have no record of patient calling since August. They also state that they have no tracking information available, but will send the request to the pharmacy and will get in touch with the patient within 24 hours. Main line representative was adamant that the RX would not have gone anywhere but to the veteran's address and that patient had not called the [facility] [place]. Patient was present during the call and is visibly upset about this. He would like his medication or at least a refund since the [facility] [place] charged him for it. I advised the patient to phone the [facility] again and request a refund and a more satisfactory response. He would like to know what documentation we need. Advised patient that if the [facility] had wrongly addressed the medication, they should be able to redress the issue themselves.

## 2021-08-13 ENCOUNTER — Ambulatory Visit (INDEPENDENT_AMBULATORY_CARE_PROVIDER_SITE_OTHER): Payer: No Typology Code available for payment source | Admitting: Physician Assistant

## 2021-08-13 ENCOUNTER — Encounter: Payer: Self-pay | Admitting: Physician Assistant

## 2021-08-13 ENCOUNTER — Other Ambulatory Visit: Payer: Self-pay

## 2021-08-13 VITALS — BP 120/61 | HR 67 | Temp 98.7°F | Resp 16 | Ht 70.0 in | Wt 152.0 lb

## 2021-08-13 DIAGNOSIS — N184 Chronic kidney disease, stage 4 (severe): Secondary | ICD-10-CM

## 2021-08-13 NOTE — Progress Notes (Signed)
° ° °  Postoperative Access Visit   History of Present Illness   Ronald Reeves is a 66 y.o. year old male who presents for postoperative follow-up for: Left radiocephalic AV fistula revision with side branch ligation x 2 by Dr. Carlis Abbott on 07/15/21.His fistula was created on 04/12/21. In follow up his fistula remained small and not maturing adequately with multiple side branches noted so it was recommended that he have these ligated in an attempt to salvage fistula and improve its maturation. The patient's wounds are well healed.  The patient notes no steal symptoms.  The patient is able to complete their activities of daily living. He does report sensitivity to cold in his left hand and a little numbness in left thumb. He says overall this is better than it was. He is not currently on Hemodialysis. He has follow up with his Nephrologist this month.  Physical Examination   Vitals:   08/13/21 1447  BP: 120/61  Pulse: 67  Resp: 16  Temp: 98.7 F (37.1 C)  TempSrc: Temporal  SpO2: 98%  Weight: 152 lb (68.9 kg)  Height: 5\' 10"  (1.778 m)   Body mass index is 21.81 kg/m.  left arm Incisions are well healed, 2+ radial pulse, hand grip is 5/5, sensation in digits is intact, faint palpable thrill, faint bruit can be auscultated.Fistula is hard to palpate in left forearm. Easier to feel thrill at [place] is a 66 y.o. year old male who presents s/p Left radiocephalic AV fistula revision with side branch ligation x 2 by Dr. Carlis Abbott on 07/15/21. Hard to tell clinically if fistula is any more mature following ligation of side branches. There is a thrill but it is faint. Patent is without signs or symptoms of steal syndrome. His previous symptoms are improved. I have recommended repeat dialysis duplex to re evaluate his fistula. He is currently not on HD. He will follow up in next 2-3 weeks with duplex.   Karoline Caldwell, PA-C [facility]  [place] Office: [phone]  Clinic MD: Roxanne Mins

## 2021-08-14 ENCOUNTER — Other Ambulatory Visit: Payer: Self-pay

## 2021-08-14 DIAGNOSIS — N184 Chronic kidney disease, stage 4 (severe): Secondary | ICD-10-CM

## 2021-09-03 ENCOUNTER — Ambulatory Visit (INDEPENDENT_AMBULATORY_CARE_PROVIDER_SITE_OTHER): Payer: No Typology Code available for payment source | Admitting: Physician Assistant

## 2021-09-03 ENCOUNTER — Other Ambulatory Visit: Payer: Self-pay

## 2021-09-03 ENCOUNTER — Ambulatory Visit (HOSPITAL_COMMUNITY)
Admission: RE | Admit: 2021-09-03 | Discharge: 2021-09-03 | Disposition: A | Discharge status: Home / Self Care | Payer: No Typology Code available for payment source | Source: Ambulatory Visit | Attending: Vascular Surgery | Admitting: Vascular Surgery

## 2021-09-03 VITALS — BP 149/59 | HR 61 | Temp 98.2°F | Ht 70.0 in | Wt 149.2 lb

## 2021-09-03 DIAGNOSIS — N184 Chronic kidney disease, stage 4 (severe): Secondary | ICD-10-CM

## 2021-09-03 NOTE — Progress Notes (Signed)
Office Note     CC:  follow up Requesting Provider:  Gerome Sam*  HPI: Ronald Reeves is a 66 y.o. (1955-09-29) male who presents for follow up of left AV fistula. He recently underwent left radiocephalic AV fistula revision with side branch ligation on 07/15/21 by Dr. Carlis Abbott. This was done to hope to help his slowly maturing fistula. His initial fistula was created on 04/12/21. His wounds are well healed. He has some sensitivity to cold in the left hand with some numbness in left thumb that has been present since the initial fistula creation. He says overall this does not interfere wit this day to day activities. He is not on Hemodialysis. He is unsure when he is to follow up with his Nephrologist  Past Medical History:  Diagnosis Date   Anemia    Arthritis    Back pain    Bronchitis    Chronic kidney disease    Coronary artery disease    GERD (gastroesophageal reflux disease)    not a current problem   Heart murmur    Hypertension    Myocardial infarction ([place])    Pre-diabetes    does not have CBG machine.   Substance abuse [facility])     Past Surgical History:  Procedure Laterality Date   AV FISTULA PLACEMENT Left 04/12/2021   Procedure: LEFT ARM ARTERIOVENOUS (AV) FISTULA CREATION;  Surgeon: Marty Heck, MD;  Location: [facility];  Service: Vascular;  Laterality: Left;   BACK SURGERY     LIGATION OF COMPETING BRANCHES OF ARTERIOVENOUS FISTULA Left 07/15/2021   Procedure: LIGATION OF COMPETING BRANCHES OF LEFT RADIOCEPHALIC ARTERIOVENOUS FISTULA TIMES TWO;  Surgeon: Marty Heck, MD;  Location: [facility] OR;  Service: Vascular;  Laterality: Left;   REVISON OF ARTERIOVENOUS FISTULA Left 07/15/2021   Procedure: REVISON OF LEFT ARTERIOVENOUS FISTULA;  Surgeon: Marty Heck, MD;  Location: [facility] OR;  Service: Vascular;  Laterality: Left;    Social History   Socioeconomic History   Marital status: Married    Spouse name: Not on file   Number of children: Not on file    Years of education: Not on file   Highest education level: Not on file  Occupational History   Not on file  Tobacco Use   Smoking status: Every Day    Types: Cigars, Cigarettes   Smokeless tobacco: Current   Tobacco comments:    3 black and mild a day  Vaping Use   Vaping Use: Never used  Substance and Sexual Activity   Alcohol use: Yes    Alcohol/week: 14.0 standard drinks    Types: 14 Cans of beer per week   Drug use: Not Currently    Frequency: 1.0 times per week    Types: Cocaine, Marijuana    Comment: marjuana-1 time a week for appetite. 07/12/21  no cocaine for 2 months   Sexual activity: Yes  Other Topics Concern   Not on file  Social History Narrative   Not on file   Social Determinants of Health   Financial Resource Strain: Not on file  Food Insecurity: Not on file  Transportation Needs: Not on file  Physical Activity: Not on file  Stress: Not on file  Social Connections: Not on file  Intimate Partner Violence: Not on file    Family History  Problem Relation Age of Onset   Heart disease Mother    Heart attack Sister 56    Current Outpatient Medications  Medication Sig Dispense Refill  amLODipine (NORVASC) 10 MG tablet Take 1 tablet (10 mg total) by mouth daily. 90 tablet 3   aspirin EC 81 MG tablet Take 81 mg by mouth daily.     diclofenac Sodium (VOLTAREN) 1 % GEL Apply 2 g topically 3 (three) times daily as needed (pain).     furosemide (LASIX) 20 MG tablet Take 10 mg by mouth daily.     hydrALAZINE (APRESOLINE) 25 MG tablet Take 2 tablets (50 mg total) by mouth every morning. (Patient taking differently: Take 25 mg by mouth 2 (two) times daily.) 180 tablet 3   isosorbide mononitrate (IMDUR) 30 MG 24 hr tablet Take 1 tablet (30 mg total) by mouth daily. 90 tablet 3   labetalol (NORMODYNE) 100 MG tablet Take 2 tablets (200 mg total) by mouth every morning. (Patient taking differently: Take 100 mg by mouth 2 (two) times daily.) 180 tablet 3   Nutritional  Supplements (ENSURE CLEAR) LIQD Take 1 Bottle by mouth in the morning and at bedtime.     Probiotic Product (PROBIOTIC PO) Take 1 capsule by mouth daily.     tamsulosin (FLOMAX) 0.4 MG CAPS capsule Take 0.4 mg by mouth daily.     HYDROcodone-acetaminophen (NORCO/VICODIN) 5-325 MG tablet Take 1 tablet by mouth every 6 (six) hours as needed for moderate pain. (Patient not taking: Reported on 09/03/2021) 10 tablet 0   ibuprofen (ADVIL) 200 MG tablet Take 400 mg by mouth every 6 (six) hours as needed for headache or moderate pain. (Patient not taking: Reported on 09/03/2021)     No current facility-administered medications for this visit.    No Known Allergies   REVIEW OF SYSTEMS:   [X]  denotes positive finding, [ ]  denotes negative finding Cardiac  Comments:  Chest pain or chest pressure:    Shortness of breath upon exertion:    Short of breath when lying flat:    Irregular heart rhythm:        Vascular    Pain in calf, thigh, or hip brought on by ambulation:    Pain in feet at night that wakes you up from your sleep:     Blood clot in your veins:    Leg swelling:         Pulmonary    Oxygen at home:    Productive cough:     Wheezing:         Neurologic    Sudden weakness in arms or legs:     Sudden numbness in arms or legs:     Sudden onset of difficulty speaking or slurred speech:    Temporary loss of vision in one eye:     Problems with dizziness:         Gastrointestinal    Blood in stool:     Vomited blood:         Genitourinary    Burning when urinating:     Blood in urine:        Psychiatric    Major depression:         Hematologic    Bleeding problems:    Problems with blood clotting too easily:        Skin    Rashes or ulcers:        Constitutional    Fever or chills:      PHYSICAL EXAMINATION:  Vitals:   09/03/21 1427  BP: (!) 149/59  Pulse: 61  Temp: 98.2 F (36.8 C)  Weight: 149 lb 3.2 oz (  67.7 kg)  Height: 5\' 10"  (1.778 m)    General:   WDWN in NAD; vital signs documented above Gait: Normal HENT: WNL, normocephalic Pulmonary: normal non-labored breathing, without wheezing Cardiac: regular HR, without Murmurs  Extremities: Left forearm incisions are well healed. 2+ radial pulse, left hand warm and well perfused. 5/5 grip strength. No palpable thrill or bruit in left forearm Neurologic: A&O X 3  Non-Invasive Vascular Imaging:  09/03/21 Findings:  +--------------------+----------+-----------------+----------+   AVF                  PSV (cm/s) Flow Vol (mL/min)  Comments    +--------------------+----------+-----------------+----------+   Native artery inflow     74            39                      +--------------------+----------+-----------------+----------+   AVF Anastomosis          0                        Thrombosed   +--------------------+----------+-----------------+----------+      +------------+----------+-------------+----------+-----------------------+   OUTFLOW VEIN PSV (cm/s) Diameter (cm) Depth (cm)        Describe           +------------+----------+-------------+----------+-----------------------+   AC Fossa                                              Compressible         +------------+----------+-------------+----------+-----------------------+   Prox Forearm                                     occluded and Thrombosed   +------------+----------+-------------+----------+-----------------------+   Mid Forearm                                      occluded and Thrombosed   +------------+----------+-------------+----------+-----------------------+   Dist Forearm                                     occluded and Thrombosed   +------------+----------+-------------+----------+-----------------------+       ASSESSMENT/PLAN:: 66 y.o. male here for follow up for left radiocephalic AV fistula. He recently underwent left radiocephalic AV fistula revision with side branch ligation on 07/15/21 by Dr. Carlis Abbott. This was  done to hope to help his slowly maturing fistula. His incisions are all healed. He has no significant steal symptoms. His duplex today unfortunately shows that his fistula is now occluded. He is right hand dominant. On prior vein mapping he had adequate bilateral basilic veins but this was performed in August so I will repeat this to see if he still has adequate conduit for a new fistula vs needing a graft - I have encouraged him to follow up with his Nephrologist  - he will return over the next couple weeks with bilateral upper extremity vein mapping   Karoline Caldwell, PA-C [facility] [phone]  Clinic MD:   Roxanne Mins

## 2021-09-10 ENCOUNTER — Other Ambulatory Visit: Payer: Self-pay | Admitting: *Deleted

## 2021-09-10 DIAGNOSIS — N184 Chronic kidney disease, stage 4 (severe): Secondary | ICD-10-CM

## 2021-09-26 ENCOUNTER — Telehealth: Payer: Self-pay | Admitting: *Deleted

## 2021-09-26 NOTE — Telephone Encounter (Signed)
Patient called states he has some soreness and throbbing in left arm. Patient denies any swelling or redness. Patient has a scheduled appt for 10/01/21 with [place]. Advised patient to keep his appt. If swelling gets worse advised patient to go to Urgent care or ER for evaluation. Patient verbalized understanding.

## 2021-09-27 NOTE — Progress Notes (Unsigned)
POST OPERATIVE OFFICE NOTE    CC:  F/u for surgery  HPI:  This is a 66 y.o. male with hx of ESRD who recently underwent left radiocephalic AV fistula revision with side branch ligation on 07/15/21 by Dr. Carlis Abbott. This was done to hope to help his slowly maturing fistula. His initial fistula was created on 04/12/21.   Pt was last seen on 09/03/2021 and at that time, unfortunately, his fistula was occluded.  He is right hand dominant.  On prior vein mapping he had adequate bilateral basilic veins but this was performed in August so BUE vein mapping was ordered as he needs new access.   Access Hx: Left RC AVF 04/12/2021 by Dr. Carlis Abbott   The pt *** on dialysis *** at *** location.   No Known Allergies  Current Outpatient Medications  Medication Sig Dispense Refill   amLODipine (NORVASC) 10 MG tablet Take 1 tablet (10 mg total) by mouth daily. 90 tablet 3   aspirin EC 81 MG tablet Take 81 mg by mouth daily.     diclofenac Sodium (VOLTAREN) 1 % GEL Apply 2 g topically 3 (three) times daily as needed (pain).     furosemide (LASIX) 20 MG tablet Take 10 mg by mouth daily.     hydrALAZINE (APRESOLINE) 25 MG tablet Take 2 tablets (50 mg total) by mouth every morning. (Patient taking differently: Take 25 mg by mouth 2 (two) times daily.) 180 tablet 3   HYDROcodone-acetaminophen (NORCO/VICODIN) 5-325 MG tablet Take 1 tablet by mouth every 6 (six) hours as needed for moderate pain. (Patient not taking: Reported on 09/03/2021) 10 tablet 0   ibuprofen (ADVIL) 200 MG tablet Take 400 mg by mouth every 6 (six) hours as needed for headache or moderate pain. (Patient not taking: Reported on 09/03/2021)     isosorbide mononitrate (IMDUR) 30 MG 24 hr tablet Take 1 tablet (30 mg total) by mouth daily. 90 tablet 3   labetalol (NORMODYNE) 100 MG tablet Take 2 tablets (200 mg total) by mouth every morning. (Patient taking differently: Take 100 mg by mouth 2 (two) times daily.) 180 tablet 3   Nutritional Supplements (ENSURE  CLEAR) LIQD Take 1 Bottle by mouth in the morning and at bedtime.     Probiotic Product (PROBIOTIC PO) Take 1 capsule by mouth daily.     tamsulosin (FLOMAX) 0.4 MG CAPS capsule Take 0.4 mg by mouth daily.     No current facility-administered medications for this visit.     ROS:  See HPI  Physical Exam:  ***  Incision:  *** Extremities:   There *** a palpable *** pulse.   Motor and sensory *** in tact.   There *** a thrill/bruit present.  The fistula/graft *** easily palpable   Dialysis Duplex on 09/27/2021: Diameter:  *** Depth:  ***   Assessment/Plan:  This is a 66 y.o. male who is s/p: Left RC AVF and ligation of side branches and now fistula is occluded and he is following up for new access with vein mapping.   -the pt does *** have evidence of steal. -the fistula/graft can be used ***. -If pt has a tunneled dialysis catheter and the access has been used successfully to the satisfaction of the dialysis center, the tunneled catheter can be scheduled to be removed at their discretion.   -discussed with pt that access does not last forever and will need intervention or even new access at some point.  -the pt will follow up ***  Leontine Locket, [facility] [phone]  Clinic MD:  Carlis Abbott

## 2021-09-30 ENCOUNTER — Other Ambulatory Visit (HOSPITAL_COMMUNITY): Payer: Self-pay | Admitting: Nephrology

## 2021-09-30 DIAGNOSIS — N185 Chronic kidney disease, stage 5: Secondary | ICD-10-CM

## 2021-10-01 ENCOUNTER — Ambulatory Visit: Payer: No Typology Code available for payment source

## 2021-10-01 ENCOUNTER — Encounter (HOSPITAL_COMMUNITY): Payer: No Typology Code available for payment source

## 2021-10-09 NOTE — Progress Notes (Signed)
3 HISTORY AND PHYSICAL     CC:  dialysis access Requesting Provider:  Gerome Sam*  HPI: This is a 66 y.o. male here for evaluation for new hemodialysis access.  Pt has hx of left RC AVF that was initially created 04/12/2021.  He was taken back to the OR on 07/15/2021 for side branch ligation to help with fistula maturation.  When he was seen back in the office, his fistula was thrombosed.  He was not on dialysis.    He was scheduled to return with repeat vein mapping and he is here today for that visit.  He states that they had to put a TDC in at [facility] and his first dialysis treatment was yesterday.    Dialysis access history: -Left RC AVF 04/12/2021 by Dr. Carlis Abbott -revision of left RC AVF with branch ligation 07/15/2021 by Dr. Carlis Abbott   The pt is right hand dominant.    Pt is on dialysis.   Days of dialysis if applicable:  T/T/S    HD center if applicable:  [place] location.   The pt is not on a statin for cholesterol management.  The pt is on a daily aspirin.  Other AC:  none The pt is on BB, CCB, hydralazine, diuretic for hypertension.  The pt is not diabetic.   Tobacco hx:  current  Past Medical History:  Diagnosis Date   Anemia    Arthritis    Back pain    Bronchitis    Chronic kidney disease    Coronary artery disease    GERD (gastroesophageal reflux disease)    not a current problem   Heart murmur    Hypertension    Myocardial infarction ([place])    Pre-diabetes    does not have CBG machine.   Substance abuse [facility])     Past Surgical History:  Procedure Laterality Date   AV FISTULA PLACEMENT Left 04/12/2021   Procedure: LEFT ARM ARTERIOVENOUS (AV) FISTULA CREATION;  Surgeon: Marty Heck, MD;  Location: [place];  Service: Vascular;  Laterality: Left;   BACK SURGERY     LIGATION OF COMPETING BRANCHES OF ARTERIOVENOUS FISTULA Left 07/15/2021   Procedure: LIGATION OF COMPETING BRANCHES OF LEFT RADIOCEPHALIC ARTERIOVENOUS FISTULA TIMES  TWO;  Surgeon: Marty Heck, MD;  Location: [place];  Service: Vascular;  Laterality: Left;   REVISON OF ARTERIOVENOUS FISTULA Left 07/15/2021   Procedure: REVISON OF LEFT ARTERIOVENOUS FISTULA;  Surgeon: Marty Heck, MD;  Location: [place];  Service: Vascular;  Laterality: Left;    No Known Allergies  Current Outpatient Medications  Medication Sig Dispense Refill   amLODipine (NORVASC) 10 MG tablet Take 1 tablet (10 mg total) by mouth daily. 90 tablet 3   aspirin EC 81 MG tablet Take 81 mg by mouth daily.     diclofenac Sodium (VOLTAREN) 1 % GEL Apply 2 g topically 3 (three) times daily as needed (pain).     furosemide (LASIX) 20 MG tablet Take 10 mg by mouth daily.     hydrALAZINE (APRESOLINE) 25 MG tablet Take 2 tablets (50 mg total) by mouth every morning. (Patient taking differently: Take 25 mg by mouth 2 (two) times daily.) 180 tablet 3   HYDROcodone-acetaminophen (NORCO/VICODIN) 5-325 MG tablet Take 1 tablet by mouth every 6 (six) hours as needed for moderate pain. (Patient not taking: Reported on 09/03/2021) 10 tablet 0   ibuprofen (ADVIL) 200 MG tablet Take 400 mg by mouth every 6 (six) hours as  needed for headache or moderate pain. (Patient not taking: Reported on 09/03/2021)     isosorbide mononitrate (IMDUR) 30 MG 24 hr tablet Take 1 tablet (30 mg total) by mouth daily. 90 tablet 3   labetalol (NORMODYNE) 100 MG tablet Take 2 tablets (200 mg total) by mouth every morning. (Patient taking differently: Take 100 mg by mouth 2 (two) times daily.) 180 tablet 3   Nutritional Supplements (ENSURE CLEAR) LIQD Take 1 Bottle by mouth in the morning and at bedtime.     Probiotic Product (PROBIOTIC PO) Take 1 capsule by mouth daily.     tamsulosin (FLOMAX) 0.4 MG CAPS capsule Take 0.4 mg by mouth daily.     No current facility-administered medications for this visit.    Family History  Problem Relation Age of Onset   Heart disease Mother    Heart attack Sister 59    Social  History   Socioeconomic History   Marital status: Married    Spouse name: Not on file   Number of children: Not on file   Years of education: Not on file   Highest education level: Not on file  Occupational History   Not on file  Tobacco Use   Smoking status: Every Day    Types: Cigars, Cigarettes   Smokeless tobacco: Current   Tobacco comments:    3 black and mild a day  Vaping Use   Vaping Use: Never used  Substance and Sexual Activity   Alcohol use: Yes    Alcohol/week: 14.0 standard drinks    Types: 14 Cans of beer per week   Drug use: Not Currently    Frequency: 1.0 times per week    Types: Cocaine, Marijuana    Comment: marjuana-1 time a week for appetite. 07/12/21  no cocaine for 2 months   Sexual activity: Yes  Other Topics Concern   Not on file  Social History Narrative   Not on file   Social Determinants of Health   Financial Resource Strain: Not on file  Food Insecurity: Not on file  Transportation Needs: Not on file  Physical Activity: Not on file  Stress: Not on file  Social Connections: Not on file  Intimate Partner Violence: Not on file     ROS: [x]  Positive   [ ]  Negative   [ ]  All sytems reviewed and are negative  Cardiac: []  chest pain/pressure []  SOB []  DOE  Vascular: []  pain in legs while walking []  pain in feet when lying flat []  hx of DVT []  swelling in legs  Pulmonary: []  asthma []  wheezing  Neurologic: []  weakness in []  arms []  legs []  numbness in []  arms []  legs [] difficulty speaking or slurred speech  Hematologic: []  bleeding problems  GI []  GERD  GU: [x]  CKD/renal failure  [x]  HD---[]  M/W/F [x]  T/T/S  Psychiatric: []  hx of major depression  Integumentary: []  rashes []  ulcers  Constitutional: []  fever []  chills   PHYSICAL EXAMINATION:  Today's Vitals   10/16/21 1021  BP: (!) 182/77  Pulse: 65  Temp: (!) 96.7 F (35.9 C)  Weight: 151 lb 11.2 oz (68.8 kg)  Height: 5\' 10"  (1.778 m)   Body mass index  is 21.77 kg/m.    General:  WDWN male in NAD Gait: Not observed HENT: WNL Pulmonary: normal non-labored breathing  Cardiac: regular, without carotid bruits Abdomen: soft, NT, no masses Skin: well healed incision left AC space Vascular Exam/Pulses:   Right Left  Radial 2+ (normal) 2+ (normal)  Ulnar 2+ (normal) 2+ (normal)   Extremities:  thrill not present LUA AVF  Neurologic: A&O X 3; Speech is fluent/normal  Non-Invasive Vascular Imaging:   Upper Extremity Vein Mapping on 10/16/2021: +-----------------+-------------+----------+--------------+   Right Cephalic    Diameter (cm) Depth (cm)    Findings      +-----------------+-------------+----------+--------------+   Prox upper arm        0.14                                  +-----------------+-------------+----------+--------------+   Mid upper arm                              not visualized   +-----------------+-------------+----------+--------------+   Dist upper arm                             not visualized   +-----------------+-------------+----------+--------------+   Antecubital fossa     0.15                                  +-----------------+-------------+----------+--------------+   Prox forearm          0.13                                  +-----------------+-------------+----------+--------------+   Mid forearm           0.18                                  +-----------------+-------------+----------+--------------+   Dist forearm          0.14                                  +-----------------+-------------+----------+--------------+   +-----------------+-------------+----------+--------------+   Right Basilic     Diameter (cm) Depth (cm)    Findings      +-----------------+-------------+----------+--------------+   Shoulder                                   not visualized   +-----------------+-------------+----------+--------------+   Prox upper arm                             not visualized    +-----------------+-------------+----------+--------------+   Mid upper arm         0.23                                  +-----------------+-------------+----------+--------------+   Dist upper arm        0.26                                  +-----------------+-------------+----------+--------------+   Antecubital fossa     0.30                                  +-----------------+-------------+----------+--------------+  Prox forearm          0.18                                  +-----------------+-------------+----------+--------------+   Mid forearm                                not visualized   +-----------------+-------------+----------+--------------+   Distal forearm                             not visualized   +-----------------+-------------+----------+--------------+   +-----------------+-------------+----------+--------------+   Left Cephalic     Diameter (cm) Depth (cm)    Findings      +-----------------+-------------+----------+--------------+   Prox upper arm                             not visualized   +-----------------+-------------+----------+--------------+   Mid upper arm                              not visualized   +-----------------+-------------+----------+--------------+   Dist upper arm                             not visualized   +-----------------+-------------+----------+--------------+   Antecubital fossa                          not visualized   +-----------------+-------------+----------+--------------+   Prox forearm                                 thrombosed     +-----------------+-------------+----------+--------------+   Mid forearm                                  thrombosed     +-----------------+-------------+----------+--------------+   Dist forearm                                    AVF         +-----------------+-------------+----------+--------------+   +-----------------+-------------+----------+--------+   Left Basilic      Diameter  (cm) Depth (cm) Findings   +-----------------+-------------+----------+--------+   Dist upper arm        0.29                            +-----------------+-------------+----------+--------+   Antecubital fossa     0.21                            +-----------------+-------------+----------+--------+   Prox forearm          0.11                            +-----------------+-------------+----------+--------+  Upper extremity arterial duplex 10/16/2021: Right Pre-Dialysis Findings:  +-----------------------+----------+--------------------+---------+--------   Location  PSV (cm/s) Intralum. Diam. (cm) Waveform   Comments   +-----------------------+----------+--------------------+---------+--------   Brachial Antecub. fossa 86         0.46                 triphasic           +-----------------------+----------+--------------------+---------+--------   Radial Art at Wrist     76         0.29                 triphasic           +-----------------------+----------+--------------------+---------+--------   Ulnar Art at Wrist      76         0.27                 triphasic           +-----------------------+----------+--------------------+---------+--------    Left Pre-Dialysis Findings:  +-----------------------+----------+--------------------+---------+--------   Location                PSV (cm/s) Intralum. Diam. (cm) Waveform   Comments   +-----------------------+----------+--------------------+---------+--------   Brachial Antecub. fossa 68         0.55                 triphasic           +-----------------------+----------+--------------------+---------+--------   Radial Art at Wrist     58         0.30                 triphasic           +-----------------------+----------+--------------------+---------+--------   Ulnar Art at Wrist      0          65.00                triphasic            +-----------------------+----------+--------------------+---------+--------  ASSESSMENT/PLAN: 66 y.o. male with thrombosed left RC AVF and here for evaluation for new hemodialysis access   -pt is now on HD as his first treatment was yesterday via [facility] that was placed at [facility].   -pt is on dialysis T/T/S -discussed with pt that access does not last forever and will need intervention or even new access at some point.  -Discussed pt's arterial and duplex study with Dr. Donzetta Matters and will plan for left arm fistula vs left arm AVG.  Discussed with pt that we would look at the basilic vein in the OR with u/s and if adequate, we would do a 1st stage.  He is aware that if the vein matures, he would need a 2nd stage operation.  He is also aware if vein is not adequate, he would require a graft.   -discussed with pt that access may not mature, may need future interventions or even fail and need new access.     Leontine Locket, [facility] [phone]  Clinic MD:   Donzetta Matters

## 2021-10-14 ENCOUNTER — Other Ambulatory Visit: Payer: Self-pay

## 2021-10-14 DIAGNOSIS — R600 Localized edema: Secondary | ICD-10-CM | POA: Insufficient documentation

## 2021-10-14 DIAGNOSIS — F1920 Other psychoactive substance dependence, uncomplicated: Secondary | ICD-10-CM | POA: Insufficient documentation

## 2021-10-14 DIAGNOSIS — I517 Cardiomegaly: Secondary | ICD-10-CM | POA: Insufficient documentation

## 2021-10-14 DIAGNOSIS — F1220 Cannabis dependence, uncomplicated: Secondary | ICD-10-CM | POA: Insufficient documentation

## 2021-10-14 DIAGNOSIS — R972 Elevated prostate specific antigen [PSA]: Secondary | ICD-10-CM | POA: Insufficient documentation

## 2021-10-14 DIAGNOSIS — H2513 Age-related nuclear cataract, bilateral: Secondary | ICD-10-CM | POA: Insufficient documentation

## 2021-10-14 DIAGNOSIS — IMO0002 Reserved for concepts with insufficient information to code with codable children: Secondary | ICD-10-CM | POA: Insufficient documentation

## 2021-10-14 DIAGNOSIS — Z122 Encounter for screening for malignant neoplasm of respiratory organs: Secondary | ICD-10-CM | POA: Insufficient documentation

## 2021-10-14 DIAGNOSIS — E785 Hyperlipidemia, unspecified: Secondary | ICD-10-CM | POA: Insufficient documentation

## 2021-10-14 DIAGNOSIS — Z720 Tobacco use: Secondary | ICD-10-CM | POA: Insufficient documentation

## 2021-10-14 DIAGNOSIS — I2510 Atherosclerotic heart disease of native coronary artery without angina pectoris: Secondary | ICD-10-CM | POA: Insufficient documentation

## 2021-10-14 DIAGNOSIS — F339 Major depressive disorder, recurrent, unspecified: Secondary | ICD-10-CM | POA: Insufficient documentation

## 2021-10-14 DIAGNOSIS — N400 Enlarged prostate without lower urinary tract symptoms: Secondary | ICD-10-CM | POA: Insufficient documentation

## 2021-10-14 DIAGNOSIS — R609 Edema, unspecified: Secondary | ICD-10-CM | POA: Insufficient documentation

## 2021-10-14 DIAGNOSIS — R0609 Other forms of dyspnea: Secondary | ICD-10-CM | POA: Insufficient documentation

## 2021-10-14 DIAGNOSIS — IMO0001 Reserved for inherently not codable concepts without codable children: Secondary | ICD-10-CM | POA: Insufficient documentation

## 2021-10-14 DIAGNOSIS — M1990 Unspecified osteoarthritis, unspecified site: Secondary | ICD-10-CM | POA: Insufficient documentation

## 2021-10-14 DIAGNOSIS — F1420 Cocaine dependence, uncomplicated: Secondary | ICD-10-CM | POA: Insufficient documentation

## 2021-10-14 DIAGNOSIS — M79671 Pain in right foot: Secondary | ICD-10-CM | POA: Insufficient documentation

## 2021-10-14 DIAGNOSIS — S6990XA Unspecified injury of unspecified wrist, hand and finger(s), initial encounter: Secondary | ICD-10-CM | POA: Insufficient documentation

## 2021-10-14 DIAGNOSIS — F1021 Alcohol dependence, in remission: Secondary | ICD-10-CM | POA: Insufficient documentation

## 2021-10-14 DIAGNOSIS — M25569 Pain in unspecified knee: Secondary | ICD-10-CM | POA: Insufficient documentation

## 2021-10-14 DIAGNOSIS — R0602 Shortness of breath: Secondary | ICD-10-CM | POA: Insufficient documentation

## 2021-10-14 DIAGNOSIS — R7303 Prediabetes: Secondary | ICD-10-CM | POA: Insufficient documentation

## 2021-10-14 DIAGNOSIS — J069 Acute upper respiratory infection, unspecified: Secondary | ICD-10-CM | POA: Insufficient documentation

## 2021-10-14 DIAGNOSIS — Z9114 Patient's other noncompliance with medication regimen: Secondary | ICD-10-CM | POA: Insufficient documentation

## 2021-10-14 DIAGNOSIS — F1020 Alcohol dependence, uncomplicated: Secondary | ICD-10-CM | POA: Insufficient documentation

## 2021-10-14 DIAGNOSIS — M542 Cervicalgia: Secondary | ICD-10-CM | POA: Insufficient documentation

## 2021-10-14 DIAGNOSIS — E559 Vitamin D deficiency, unspecified: Secondary | ICD-10-CM | POA: Insufficient documentation

## 2021-10-14 DIAGNOSIS — F1910 Other psychoactive substance abuse, uncomplicated: Secondary | ICD-10-CM | POA: Insufficient documentation

## 2021-10-14 DIAGNOSIS — U071 COVID-19: Secondary | ICD-10-CM | POA: Insufficient documentation

## 2021-10-14 NOTE — Progress Notes (Signed)
Error

## 2021-10-16 ENCOUNTER — Other Ambulatory Visit: Payer: Self-pay

## 2021-10-16 ENCOUNTER — Ambulatory Visit (INDEPENDENT_AMBULATORY_CARE_PROVIDER_SITE_OTHER): Payer: No Typology Code available for payment source | Admitting: Physician Assistant

## 2021-10-16 ENCOUNTER — Encounter: Payer: Self-pay | Admitting: Physician Assistant

## 2021-10-16 ENCOUNTER — Ambulatory Visit (HOSPITAL_COMMUNITY)
Admission: RE | Admit: 2021-10-16 | Discharge: 2021-10-16 | Disposition: A | Discharge status: Home / Self Care | Payer: No Typology Code available for payment source | Source: Ambulatory Visit | Attending: Physician Assistant | Admitting: Physician Assistant

## 2021-10-16 ENCOUNTER — Ambulatory Visit (HOSPITAL_COMMUNITY)
Admission: RE | Admit: 2021-10-16 | Discharge: 2021-10-16 | Disposition: A | Discharge status: Home / Self Care | Payer: No Typology Code available for payment source | Source: Ambulatory Visit | Attending: Vascular Surgery | Admitting: Vascular Surgery

## 2021-10-16 VITALS — BP 182/77 | HR 65 | Temp 96.7°F | Ht 70.0 in | Wt 151.7 lb

## 2021-10-16 DIAGNOSIS — N184 Chronic kidney disease, stage 4 (severe): Secondary | ICD-10-CM | POA: Insufficient documentation

## 2021-10-16 DIAGNOSIS — N186 End stage renal disease: Secondary | ICD-10-CM

## 2021-10-16 DIAGNOSIS — Z992 Dependence on renal dialysis: Secondary | ICD-10-CM

## 2021-10-17 ENCOUNTER — Other Ambulatory Visit: Payer: Self-pay

## 2021-10-17 ENCOUNTER — Encounter (HOSPITAL_COMMUNITY): Payer: Self-pay | Admitting: Vascular Surgery

## 2021-10-17 NOTE — Progress Notes (Signed)
PCP - Dr. Collins [place] ? ?Cardiologist - [place] ? ?EP- Denies ? ?Endocrine- Denies ? ?Pulm- Denies ? ?Chest x-ray - 03/11/21 (E) ? ?EKG - 10/18/21 -Day of surgery ? ?Stress Test - 08/21/17 (E) ? ?ECHO - 08/21/17 (E) ? ?Cardiac Cath - Denies ? ?AICD-na  ?PM-na ?LOOP-na ? ?Nerve Stimulator- Denies ? ?Dialysis- T-TH-Sat ? ?Sleep Study - Denies- Positive Stop [place] Lighter- sent to PCP ?CPAP - Denies ? ?LABS- 10/18/21: I-Stat 8 ? ?ASA- Cont. ? ?ERAS- Denies ? ?HA1C- 08/20/17(CE): 5.5 ?Fasting Blood Sugar - Does not check bs ? ? ?Anesthesia- No ? ?Pt denies having chest pain, sob, or fever during the pre-op phone call. All instructions explained to the pt, with a verbal understanding of the material including: as of today,  stop taking all Aspirin (unless instructed by your doctor) and Other Aspirin containing products, Vitamins, Fish oils, and Herbal medications. Also stop all NSAIDS i.e. Advil, Ibuprofen, Motrin, Aleve, Anaprox, Naproxen, BC, Goody Powders, and all Supplements.  Pt also instructed to wear a mask and social distance if he goes out. The opportunity to ask questions was provided.  ? ? ?Coronavirus Screening ? ?Have you experienced the following symptoms:  ?Cough yes/no: No ?Fever (>100.95F)  yes/no: No ?Runny nose yes/no: No ?Sore throat yes/no: No ?Difficulty breathing/shortness of breath  yes/no: No ? ?Have you or a family member traveled in the last 14 days and where? yes/no: No ? ? ?If the patient indicates "YES" to the above questions, their PAT will be rescheduled to limit the exposure to others and, the surgeon will be notified. THE PATIENT WILL NEED TO BE ASYMPTOMATIC FOR 14 DAYS.  ? ?If the patient is not experiencing any of these symptoms, the PAT nurse will instruct them to NOT bring anyone with them to their appointment since they may have these symptoms or traveled as well.  ? ?Please remind your patients and families that hospital visitation restrictions are in effect and the importance of the  restrictions.  ? ?

## 2021-10-17 NOTE — Progress Notes (Signed)
?   10/17/21 1903  ?OBSTRUCTIVE SLEEP APNEA  ?Have you ever been diagnosed with sleep apnea through a sleep study? No  ?Do you snore loudly (loud enough to be heard through closed doors)?  1  ?Do you often feel tired, fatigued, or sleepy during the daytime (such as falling asleep during driving or talking to someone)? 0  ?Has anyone observed you stop breathing during your sleep? 0  ?Do you have, or are you being treated for high blood pressure? 1  ?BMI more than 35 kg/m2? 1  ?Age > 30 (1-yes) 1  ?Male Gender (Yes=1) 1  ?Obstructive Sleep Apnea Score 5  ?Score 5 or greater  Results sent to PCP  ? ? ?

## 2021-10-18 ENCOUNTER — Ambulatory Visit (HOSPITAL_COMMUNITY): Payer: No Typology Code available for payment source | Admitting: Anesthesiology

## 2021-10-18 ENCOUNTER — Other Ambulatory Visit: Payer: Self-pay

## 2021-10-18 ENCOUNTER — Ambulatory Visit (HOSPITAL_BASED_OUTPATIENT_CLINIC_OR_DEPARTMENT_OTHER): Payer: No Typology Code available for payment source | Admitting: Anesthesiology

## 2021-10-18 ENCOUNTER — Encounter (HOSPITAL_COMMUNITY): Payer: Self-pay | Admitting: Vascular Surgery

## 2021-10-18 ENCOUNTER — Encounter (HOSPITAL_COMMUNITY)
Admission: RE | Disposition: A | Discharge status: Home / Self Care | Payer: Self-pay | Source: Home / Self Care | Attending: Vascular Surgery

## 2021-10-18 ENCOUNTER — Ambulatory Visit (HOSPITAL_COMMUNITY)
Admission: RE | Admit: 2021-10-18 | Discharge: 2021-10-18 | Disposition: A | Discharge status: Home / Self Care | Payer: No Typology Code available for payment source | Attending: Vascular Surgery | Admitting: Vascular Surgery

## 2021-10-18 DIAGNOSIS — E1122 Type 2 diabetes mellitus with diabetic chronic kidney disease: Secondary | ICD-10-CM | POA: Insufficient documentation

## 2021-10-18 DIAGNOSIS — N189 Chronic kidney disease, unspecified: Secondary | ICD-10-CM

## 2021-10-18 DIAGNOSIS — Z7982 Long term (current) use of aspirin: Secondary | ICD-10-CM | POA: Diagnosis not present

## 2021-10-18 DIAGNOSIS — F17210 Nicotine dependence, cigarettes, uncomplicated: Secondary | ICD-10-CM | POA: Insufficient documentation

## 2021-10-18 DIAGNOSIS — I120 Hypertensive chronic kidney disease with stage 5 chronic kidney disease or end stage renal disease: Secondary | ICD-10-CM | POA: Diagnosis not present

## 2021-10-18 DIAGNOSIS — I252 Old myocardial infarction: Secondary | ICD-10-CM | POA: Diagnosis not present

## 2021-10-18 DIAGNOSIS — Z992 Dependence on renal dialysis: Secondary | ICD-10-CM | POA: Diagnosis not present

## 2021-10-18 DIAGNOSIS — I2510 Atherosclerotic heart disease of native coronary artery without angina pectoris: Secondary | ICD-10-CM

## 2021-10-18 DIAGNOSIS — N185 Chronic kidney disease, stage 5: Secondary | ICD-10-CM | POA: Diagnosis not present

## 2021-10-18 DIAGNOSIS — N186 End stage renal disease: Secondary | ICD-10-CM | POA: Diagnosis not present

## 2021-10-18 DIAGNOSIS — Z79899 Other long term (current) drug therapy: Secondary | ICD-10-CM | POA: Diagnosis not present

## 2021-10-18 HISTORY — PX: AV FISTULA PLACEMENT: SHX1204

## 2021-10-18 LAB — POCT I-STAT, CHEM 8
BUN: 35 mg/dL — ABNORMAL HIGH (ref 8–23)
Calcium, Ion: 1.09 mmol/L — ABNORMAL LOW (ref 1.15–1.40)
Chloride: 103 mmol/L (ref 98–111)
Creatinine, Ser: 4 mg/dL — ABNORMAL HIGH (ref 0.61–1.24)
Glucose, Bld: 96 mg/dL (ref 70–99)
HCT: 32 % — ABNORMAL LOW (ref 39.0–52.0)
Hemoglobin: 10.9 g/dL — ABNORMAL LOW (ref 13.0–17.0)
Potassium: 4.2 mmol/L (ref 3.5–5.1)
Sodium: 140 mmol/L (ref 135–145)
TCO2: 28 mmol/L (ref 22–32)

## 2021-10-18 LAB — GLUCOSE, CAPILLARY: Glucose-Capillary: 141 mg/dL — ABNORMAL HIGH (ref 70–99)

## 2021-10-18 SURGERY — ARTERIOVENOUS (AV) FISTULA CREATION
Anesthesia: Regional | Laterality: Left

## 2021-10-18 MED ORDER — HEPARIN 6000 UNIT IRRIGATION SOLUTION
Status: AC
  Filled 2021-10-18: qty 500

## 2021-10-18 MED ORDER — LIDOCAINE 2% (20 MG/ML) 5 ML SYRINGE
INTRAMUSCULAR | Status: DC | PRN
  Administered 2021-10-18: 20 mg via INTRAVENOUS

## 2021-10-18 MED ORDER — EPHEDRINE 5 MG/ML INJ
INTRAVENOUS | Status: AC
  Filled 2021-10-18: qty 5

## 2021-10-18 MED ORDER — FENTANYL CITRATE (PF) 100 MCG/2ML IJ SOLN: 50.0000 ug | Freq: Once | INTRAMUSCULAR | Status: AC

## 2021-10-18 MED ORDER — LACTATED RINGERS IV SOLN: INTRAVENOUS | Status: DC

## 2021-10-18 MED ORDER — LIDOCAINE HCL 1 % IJ SOLN
INTRAMUSCULAR | Status: DC | PRN
  Administered 2021-10-18: 8.5 mL

## 2021-10-18 MED ORDER — PROPOFOL 10 MG/ML IV BOLUS
INTRAVENOUS | Status: DC | PRN
  Administered 2021-10-18: 20 mg via INTRAVENOUS
  Administered 2021-10-18: 10 mg via INTRAVENOUS

## 2021-10-18 MED ORDER — ONDANSETRON HCL 4 MG/2ML IJ SOLN
INTRAMUSCULAR | Status: DC | PRN
  Administered 2021-10-18: 4 mg via INTRAVENOUS

## 2021-10-18 MED ORDER — CEFAZOLIN SODIUM-DEXTROSE 2-4 GM/100ML-% IV SOLN
2.0000 g | INTRAVENOUS | Status: AC
  Administered 2021-10-18: 2 g via INTRAVENOUS
  Filled 2021-10-18: qty 100

## 2021-10-18 MED ORDER — 0.9 % SODIUM CHLORIDE (POUR BTL) OPTIME
TOPICAL | Status: DC | PRN
  Administered 2021-10-18: 1000 mL

## 2021-10-18 MED ORDER — HEPARIN 6000 UNIT IRRIGATION SOLUTION
Status: DC | PRN
  Administered 2021-10-18: 1

## 2021-10-18 MED ORDER — HEPARIN SODIUM (PORCINE) 1000 UNIT/ML IJ SOLN
INTRAMUSCULAR | Status: AC
  Filled 2021-10-18: qty 10

## 2021-10-18 MED ORDER — HEPARIN SODIUM (PORCINE) 1000 UNIT/ML IJ SOLN
INTRAMUSCULAR | Status: DC | PRN
  Administered 2021-10-18: 3000 [IU] via INTRAVENOUS

## 2021-10-18 MED ORDER — CLONIDINE HCL (ANALGESIA) 100 MCG/ML EP SOLN
EPIDURAL | Status: DC | PRN
  Administered 2021-10-18: 100 ug

## 2021-10-18 MED ORDER — MIDAZOLAM HCL 2 MG/2ML IJ SOLN: 1.0000 mg | Freq: Once | INTRAMUSCULAR | Status: AC

## 2021-10-18 MED ORDER — ORAL CARE MOUTH RINSE: 15.0000 mL | Freq: Once | OROMUCOSAL | Status: AC

## 2021-10-18 MED ORDER — PROPOFOL 500 MG/50ML IV EMUL
INTRAVENOUS | Status: DC | PRN
  Administered 2021-10-18: 75 ug/kg/min via INTRAVENOUS

## 2021-10-18 MED ORDER — EPHEDRINE SULFATE-NACL 50-0.9 MG/10ML-% IV SOSY
PREFILLED_SYRINGE | INTRAVENOUS | Status: DC | PRN
Start: 2021-10-18 — End: 2021-10-18
  Administered 2021-10-18 (×3): 5 mg via INTRAVENOUS

## 2021-10-18 MED ORDER — MIDAZOLAM HCL 2 MG/2ML IJ SOLN
INTRAMUSCULAR | Status: AC
  Administered 2021-10-18: 1 mg via INTRAVENOUS
  Filled 2021-10-18: qty 2

## 2021-10-18 MED ORDER — FENTANYL CITRATE (PF) 100 MCG/2ML IJ SOLN
INTRAMUSCULAR | Status: AC
  Administered 2021-10-18: 50 ug via INTRAVENOUS
  Filled 2021-10-18: qty 2

## 2021-10-18 MED ORDER — FENTANYL CITRATE (PF) 250 MCG/5ML IJ SOLN
INTRAMUSCULAR | Status: DC | PRN
  Administered 2021-10-18: 25 ug via INTRAVENOUS

## 2021-10-18 MED ORDER — HYDROCODONE-ACETAMINOPHEN 5-325 MG PO TABS: 1.0000 | ORAL_TABLET | Freq: Four times a day (QID) | ORAL | 0 refills | Status: DC | PRN

## 2021-10-18 MED ORDER — CHLORHEXIDINE GLUCONATE 4 % EX LIQD: 60.0000 mL | Freq: Once | CUTANEOUS | Status: DC

## 2021-10-18 MED ORDER — CHLORHEXIDINE GLUCONATE 0.12 % MT SOLN
15.0000 mL | Freq: Once | OROMUCOSAL | Status: AC
  Administered 2021-10-18: 15 mL via OROMUCOSAL
  Filled 2021-10-18: qty 15

## 2021-10-18 MED ORDER — ROPIVACAINE HCL 5 MG/ML IJ SOLN
INTRAMUSCULAR | Status: DC | PRN
  Administered 2021-10-18: 30 mL via PERINEURAL

## 2021-10-18 MED ORDER — FENTANYL CITRATE (PF) 250 MCG/5ML IJ SOLN
INTRAMUSCULAR | Status: AC
  Filled 2021-10-18: qty 5

## 2021-10-18 MED ORDER — ONDANSETRON HCL 4 MG/2ML IJ SOLN
INTRAMUSCULAR | Status: AC
  Filled 2021-10-18: qty 2

## 2021-10-18 MED ORDER — SODIUM CHLORIDE 0.9 % IV SOLN: INTRAVENOUS | Status: DC

## 2021-10-18 SURGICAL SUPPLY — 39 items
ADH SKN CLS APL DERMABOND .7 (GAUZE/BANDAGES/DRESSINGS) ×1
AGENT HMST SPONGE THK3/8 (HEMOSTASIS)
ARMBAND PINK RESTRICT EXTREMIT (MISCELLANEOUS) ×3 IMPLANT
BAG COUNTER SPONGE SURGICOUNT (BAG) ×1 IMPLANT
BAG SPNG CNTER NS LX DISP (BAG)
BLADE CLIPPER SURG (BLADE) ×2 IMPLANT
CANISTER SUCT 3000ML PPV (MISCELLANEOUS) ×2 IMPLANT
CLIP VESOCCLUDE MED 6/CT (CLIP) ×2 IMPLANT
CLIP VESOCCLUDE SM WIDE 6/CT (CLIP) ×2 IMPLANT
COVER PROBE W GEL 5X96 (DRAPES) ×2 IMPLANT
DECANTER SPIKE VIAL GLASS SM (MISCELLANEOUS) ×1 IMPLANT
DERMABOND ADVANCED (GAUZE/BANDAGES/DRESSINGS) ×1
DERMABOND ADVANCED .7 DNX12 (GAUZE/BANDAGES/DRESSINGS) ×1 IMPLANT
ELECT REM PT RETURN 9FT ADLT (ELECTROSURGICAL) ×2
ELECTRODE REM PT RTRN 9FT ADLT (ELECTROSURGICAL) ×1 IMPLANT
GLOVE SRG 8 PF TXTR STRL LF DI (GLOVE) ×1 IMPLANT
GLOVE SURG ENC MOIS LTX SZ7.5 (GLOVE) ×4 IMPLANT
GLOVE SURG UNDER POLY LF SZ8 (GLOVE) ×4
GOWN STRL REUS W/ TWL LRG LVL3 (GOWN DISPOSABLE) ×2 IMPLANT
GOWN STRL REUS W/ TWL XL LVL3 (GOWN DISPOSABLE) ×2 IMPLANT
GOWN STRL REUS W/TWL LRG LVL3 (GOWN DISPOSABLE) ×6
GOWN STRL REUS W/TWL XL LVL3 (GOWN DISPOSABLE) ×2
HEMOSTAT SPONGE AVITENE ULTRA (HEMOSTASIS) IMPLANT
KIT BASIN OR (CUSTOM PROCEDURE TRAY) ×2 IMPLANT
KIT TURNOVER KIT B (KITS) ×2 IMPLANT
NS IRRIG 1000ML POUR BTL (IV SOLUTION) ×2 IMPLANT
PACK CV ACCESS (CUSTOM PROCEDURE TRAY) ×2 IMPLANT
PAD ARMBOARD 7.5X6 YLW CONV (MISCELLANEOUS) ×4 IMPLANT
SLING ARM FOAM STRAP LRG (SOFTGOODS) ×1 IMPLANT
SLING ARM FOAM STRAP MED (SOFTGOODS) IMPLANT
SUT MNCRL AB 4-0 PS2 18 (SUTURE) ×2 IMPLANT
SUT PROLENE 6 0 BV (SUTURE) ×2 IMPLANT
SUT PROLENE 7 0 BV 1 (SUTURE) IMPLANT
SUT SILK 2 0 SH (SUTURE) ×1 IMPLANT
SUT VIC AB 3-0 SH 27 (SUTURE) ×2
SUT VIC AB 3-0 SH 27X BRD (SUTURE) ×1 IMPLANT
TOWEL GREEN STERILE (TOWEL DISPOSABLE) ×2 IMPLANT
UNDERPAD 30X36 HEAVY ABSORB (UNDERPADS AND DIAPERS) ×2 IMPLANT
WATER STERILE IRR 1000ML POUR (IV SOLUTION) ×2 IMPLANT

## 2021-10-18 NOTE — Transfer of Care (Signed)
Immediate Anesthesia Transfer of Care Note ? ?Patient: Ronald Reeves ? ?Procedure(s) Performed: LEFT ARM BRACHIOBASILIC FISTULA CREATION FIRST STAGE (Left) ? ?Patient Location: PACU ? ?Anesthesia Type:MAC and Regional ? ?Level of Consciousness: oriented, sedated and patient cooperative ? ?Airway & Oxygen Therapy: Patient Spontanous Breathing and Patient connected to face mask oxygen ? ?Post-op Assessment: Report given to RN and Post -op Vital signs reviewed and stable ? ?Post vital signs: Reviewed ? ?Last Vitals:  ?Vitals Value Taken Time  ?BP    ?Temp    ?Pulse 54 10/18/21 1020  ?Resp 10 10/18/21 1020  ?SpO2 100 % 10/18/21 1020  ?Vitals shown include unvalidated device data. ? ?Last Pain:  ?Vitals:  ? 10/18/21 0729  ?TempSrc: Oral  ?PainSc:   ?   ? ?Patients Stated Pain Goal: 2 (10/17/21 1905) ? ?Complications: No notable events documented. ?

## 2021-10-18 NOTE — Op Note (Signed)
? ? ?OPERATIVE NOTE ? ? ?PROCEDURE: left first stage basilic vein transposition (brachiobasilic arteriovenous fistula) placement ? ?PRE-OPERATIVE DIAGNOSIS: End stage renal disease ? ?POST-OPERATIVE DIAGNOSIS: same ? ?SURGEON: Marty Heck, MD ? ?ASSISTANT(S): Leontine Locket, PA ? ?ANESTHESIA: regional ? ?ESTIMATED BLOOD LOSS: Minimal ? ?FINDING(S): ?1.  Basilic vein: 3 mm, acceptable ?2.  Brachial artery: 4.5 mm,disease free ?3.  Venous outflow: palpable thrill  ?4.  Radial flow: palpable radial pulse ? ?SPECIMEN(S):  none ? ?INDICATIONS:   ?Ronald Reeves is a 66 y.o. male who presents with end stage renal disease and need for permanent hemodialysis access.  The patient is scheduled for left arm AVF versus AVG.  The patient is aware the risks include but are not limited to: bleeding, infection, steal syndrome, nerve damage, ischemic monomelic neuropathy, failure to mature, and need for additional procedures.  The patient is aware of the risks of the procedure and elects to proceed forward.  An assistant was needed for exposure and to expedite the case and for sewing the anastomosis. ? ? ?DESCRIPTION: ?After full informed written consent was obtained from the patient, the patient was brought back to the operating room and placed supine upon the operating table.  Prior to induction, the patient received IV antibiotics.   After obtaining adequate anesthesia, the patient was then prepped and draped in the standard fashion for a left arm access procedure.  I turned my attention first to identifying the patient's cephalic vein which was small in the upper arm.  The basilic vein was much larger at the antecubital fossa. ? ?Using SonoSite guidance, the location of these vessels were marked out on the skin.   At this point, I injected local anesthetic to obtain a field block of the antecubitum.  In total, I injected about 10 mL of 1% lidocaine without epinephrine.  I made a transverse incision at the level of the  antecubitum and dissected through the subcutaneous tissue and fascia to gain exposure of the brachial artery.  This was noted to be 4.5 mm in diameter externally.  This was dissected out proximally and distally and controlled with vessel loops .  I then dissected out the basilic vein.  This was noted to be 3 mm in diameter externally.  The distal segment of the vein was ligated with a  2-0 silk, and the vein was transected.  The proximal segment was interrogated with serial dilators.  The vein accepted up to a 5 mm dilator without any difficulty.  I then instilled the heparinized saline into the vein and clamped it.  At this point, I reset my exposure of the brachial artery.  The patient was given 3,000 units IV heparin.  I then placed the artery under tension proximally and distally.  I made an arteriotomy with a #11 blade, and then I extended the arteriotomy with a Potts scissor.  I injected heparinized saline proximal and distal to this arteriotomy.  The vein was then sewn to the artery in an end-to-side configuration with a running stitch of 6-0 Prolene with my assistant.  Prior to completing this anastomosis, I allowed the vein and artery to backbleed.  There was no evidence of clot from any vessels.  I completed the anastomosis in the usual fashion and then released all vessel loops and clamps.   ? ?There was a palpable thrill in the venous outflow, and there was a palpable radial pulse.  At this point, I irrigated out the surgical wound.  There was no  further active bleeding.  The subcutaneous tissue was reapproximated with a running stitch of 3-0 Vicryl.  The skin was then reapproximated with a running subcuticular stitch of 4-0 Monocryl.  The skin was then cleaned, dried, and reinforced with Dermabond.  The patient tolerated this procedure well.  ? ?COMPLICATIONS: None ? ?CONDITION: Stable ? ?Marty Heck MD ?[facility] ?Office: [phone] ? ?Marty Heck ? ? ?10/18/2021, 10:18 AM ? ? ?  ?

## 2021-10-18 NOTE — Anesthesia Postprocedure Evaluation (Signed)
Anesthesia Post Note ? ?Patient: Eddie Payette ? ?Procedure(s) Performed: LEFT ARM BRACHIOBASILIC FISTULA CREATION FIRST STAGE (Left) ? ?  ? ?Patient location during evaluation: PACU ?Anesthesia Type: Regional and MAC ?Level of consciousness: awake ?Pain management: pain level controlled ?Vital Signs Assessment: post-procedure vital signs reviewed and stable ?Respiratory status: spontaneous breathing ?Cardiovascular status: stable ?Postop Assessment: no apparent nausea or vomiting ?Anesthetic complications: no ? ? ?No notable events documented. ? ?Last Vitals:  ?Vitals:  ? 10/18/21 0850 10/18/21 1020  ?BP: (!) 149/63 130/63  ?Pulse: (!) 58 (!) 54  ?Resp: 11 (!) 7  ?Temp:  (!) 36.2 ?C  ?SpO2: 100% 98%  ?  ?Last Pain:  ?Vitals:  ? 10/18/21 1020  ?TempSrc:   ?PainSc: 0-No pain  ? ? ?  ?  ?  ?  ?  ?  ? ?Huston Foley ? ? ? ? ?

## 2021-10-18 NOTE — Anesthesia Preprocedure Evaluation (Addendum)
Anesthesia Evaluation  ?Patient identified by MRN, date of birth, ID band ?Patient awake ? ? ? ?Reviewed: ?Allergy & Precautions, NPO status , Patient's Chart, lab work & pertinent test results ? ?Airway ?Mallampati: II ? ?TM Distance: >3 FB ?Neck ROM: Full ? ? ? Dental ?no notable dental hx. ?(+) Teeth Intact, Dental Advisory Given ?  ?Pulmonary ?Current Smoker and Patient abstained from smoking.,  ?  ?Pulmonary exam normal ?breath sounds clear to auscultation ? ? ? ? ? ? Cardiovascular ?hypertension, Pt. on medications and Pt. on home beta blockers ?+ CAD and + Past MI  ?Normal cardiovascular exam ?Rhythm:Regular Rate:Normal ? ? ?  ?Neuro/Psych ?PSYCHIATRIC DISORDERS   ? GI/Hepatic ?GERD  ,  ?Endo/Other  ? ? Renal/GU ?DialysisRenal diseaseT, TH Sat. ? ?Lab Results ?     Component                Value               Date                 ?     WBC                      4.4                 03/11/2021           ?     HGB                      10.9 (L)            10/18/2021           ?     HCT                      32.0 (L)            10/18/2021           ?     MCV                      84.9                03/11/2021           ?     PLT                      117 (L)             03/11/2021           ?  ? ?  ?Musculoskeletal ? ?(+) Arthritis ,  ? Abdominal ?Normal abdominal exam  (+)   ?Peds ? Hematology ? ?(+) Blood dyscrasia, anemia , Lab Results ?     Component                Value               Date                 ?     WBC                      4.4                 03/11/2021           ?     HGB  10.9 (L)            10/18/2021           ?     HCT                      32.0 (L)            10/18/2021           ?     MCV                      84.9                03/11/2021           ?     PLT                      117 (L)             03/11/2021           ?   ?Anesthesia Other Findings ? ? Reproductive/Obstetrics ? ?  ? ? ? ? ? ? ? ? ? ? ? ? ? ?  ?  ? ? ? ? ? ? ?Anesthesia  Physical ?Anesthesia Plan ? ?ASA: 3 ? ?Anesthesia Plan: Regional and MAC  ? ?Post-op Pain Management: Regional block*  ? ?Induction:  ? ?PONV Risk Score and Plan: Midazolam, Ondansetron and Treatment may vary due to age or medical condition ? ?Airway Management Planned: Nasal Cannula and Natural Airway ? ?Additional Equipment: None ? ?Intra-op Plan:  ? ?Post-operative Plan:  ? ?Informed Consent: I have reviewed the patients History and Physical, chart, labs and discussed the procedure including the risks, benefits and alternatives for the proposed anesthesia with the patient or authorized representative who has indicated his/her understanding and acceptance.  ? ? ? ?Dental advisory given ? ?Plan Discussed with:  ? ?Anesthesia Plan Comments:   ? ? ? ? ? ?Anesthesia Quick Evaluation ? ?

## 2021-10-18 NOTE — Discharge Instructions (Signed)
? ?  [facility] ? ?Discharge Instructions ? ?AV Fistula or Graft Surgery for Dialysis Access ? ?Please refer to the following instructions for your post-procedure care. Your surgeon or physician assistant will discuss any changes with you. ? ?Activity ? ?You may drive the day following your surgery, if you are comfortable and no longer taking prescription pain medication. Resume full activity as the soreness in your incision resolves. ? ?Bathing/Showering ? ?You may shower after you go home. Keep your incision dry for 48 hours. Do not soak in a bathtub, hot tub, or swim until the incision heals completely. You may not shower if you have a hemodialysis catheter. ? ?Incision Care ? ?Clean your incision with mild soap and water after 48 hours. Pat the area dry with a clean towel. You do not need a bandage unless otherwise instructed. Do not apply any ointments or creams to your incision. You may have skin glue on your incision. Do not peel it off. It will come off on its own in about one week. Your arm may swell a bit after surgery. To reduce swelling use pillows to elevate your arm so it is above your heart. Your doctor will tell you if you need to lightly wrap your arm with an ACE bandage. ? ?Diet ? ?Resume your normal diet. There are not special food restrictions following this procedure. In order to heal from your surgery, it is CRITICAL to get adequate nutrition. Your body requires vitamins, minerals, and protein. Vegetables are the best source of vitamins and minerals. Vegetables also provide the perfect balance of protein. Processed food has little nutritional value, so try to avoid this. ? ?Medications ? ?Resume taking all of your medications. If your incision is causing pain, you may take over-the counter pain relievers such as acetaminophen (Tylenol). If you were prescribed a stronger pain medication, please be aware these medications can cause nausea and constipation. Prevent  nausea by taking the medication with a snack or meal. Avoid constipation by drinking plenty of fluids and eating foods with high amount of fiber, such as fruits, vegetables, and grains.  ?Do not take Tylenol if you are taking prescription pain medications. ? ?Follow up ?Your surgeon may want to see you in the office following your access surgery. If so, this will be arranged at the time of your surgery. ? ?Please call us immediately for any of the following conditions: ? ?Increased pain, redness, drainage (pus) from your incision site ?Fever of 101 degrees or higher ?Severe or worsening pain at your incision site ?Hand pain or numbness. ? ?Reduce your risk of vascular disease: ? ?Stop smoking. If you would like help, call [facility] at 1-800-QUIT-NOW 713-126-6693) or [facility] at (847)053-2824 ? ?Manage your cholesterol ?Maintain a desired weight ?Control your diabetes ?Keep your blood pressure down ? ?Dialysis ? ?It will take several weeks to several months for your new dialysis access to be ready for use. Your surgeon will determine when it is okay to use it. Your nephrologist will continue to direct your dialysis. You can continue to use your Permcath until your new access is ready for use. ? ? ?10/18/2021 ?Ronald Reeves ?[id number] ?1956/06/11 ? ?Surgeon(s): ?Marty Heck, MD ? ?Procedure(s): ?LEFT ARM BRACHIOBASILIC FISTULA CREATION FIRST STAGE ? ?x Do not stick fistula for 12 weeks  ? ? ?If you have any questions, please call the office at [phone]. ? ?

## 2021-10-18 NOTE — H&P (Signed)
History and Physical Interval Note:  10/18/2021 8:32 AM  Ronald Reeves  has presented today for surgery, with the diagnosis of ESRD.  The various methods of treatment have been discussed with the patient and family. After consideration of risks, benefits and other options for treatment, the patient has consented to  Procedure(s): LEFT ARM ARTERIOVENOUS (AV) FISTULA VERSUS ARTERIOVENOUS GRAFT CREATION (Left) as a surgical intervention.  The patient's history has been reviewed, patient examined, no change in status, stable for surgery.  I have reviewed the patient's chart and labs.  Questions were answered to the patient's satisfaction.    Left arm AVF vs graft  Marty Heck  CC:  dialysis access Requesting Provider:  Gerome Sam*   HPI: This is a 66 y.o. male here for evaluation for new hemodialysis access.  Pt has hx of left RC AVF that was initially created 04/12/2021.  He was taken back to the OR on 07/15/2021 for side branch ligation to help with fistula maturation.  When he was seen back in the office, his fistula was thrombosed.  He was not on dialysis.    He was scheduled to return with repeat vein mapping and he is here today for that visit.   He states that they had to put a TDC in at [facility] and his first dialysis treatment was yesterday.     Dialysis access history: -Left RC AVF 04/12/2021 by Dr. Carlis Abbott -revision of left RC AVF with branch ligation 07/15/2021 by Dr. Carlis Abbott     The pt is right hand dominant.    Pt is on dialysis.   Days of dialysis if applicable:  T/T/S    HD center if applicable:  [place] location.     The pt is not on a statin for cholesterol management.  The pt is on a daily aspirin.  Other AC:  none The pt is on BB, CCB, hydralazine, diuretic for hypertension.  The pt is not diabetic.   Tobacco hx:  current       Past Medical History:  Diagnosis Date   Anemia     Arthritis     Back pain     Bronchitis     Chronic  kidney disease     Coronary artery disease     GERD (gastroesophageal reflux disease)      not a current problem   Heart murmur     Hypertension     Myocardial infarction ([place])     Pre-diabetes      does not have CBG machine.   Substance abuse [facility])             Past Surgical History:  Procedure Laterality Date   AV FISTULA PLACEMENT Left 04/12/2021    Procedure: LEFT ARM ARTERIOVENOUS (AV) FISTULA CREATION;  Surgeon: Marty Heck, MD;  Location: [facility];  Service: Vascular;  Laterality: Left;   BACK SURGERY       LIGATION OF COMPETING BRANCHES OF ARTERIOVENOUS FISTULA Left 07/15/2021    Procedure: LIGATION OF COMPETING BRANCHES OF LEFT RADIOCEPHALIC ARTERIOVENOUS FISTULA TIMES TWO;  Surgeon: Marty Heck, MD;  Location: [facility];  Service: Vascular;  Laterality: Left;   REVISON OF ARTERIOVENOUS FISTULA Left 07/15/2021    Procedure: REVISON OF LEFT ARTERIOVENOUS FISTULA;  Surgeon: Marty Heck, MD;  Location: [facility];  Service: Vascular;  Laterality: Left;      No Known Allergies         Current Outpatient Medications  Medication Sig Dispense Refill   amLODipine (NORVASC) 10 MG tablet Take 1 tablet (10 mg total) by mouth daily. 90 tablet 3   aspirin EC 81 MG tablet Take 81 mg by mouth daily.       diclofenac Sodium (VOLTAREN) 1 % GEL Apply 2 g topically 3 (three) times daily as needed (pain).       furosemide (LASIX) 20 MG tablet Take 10 mg by mouth daily.       hydrALAZINE (APRESOLINE) 25 MG tablet Take 2 tablets (50 mg total) by mouth every morning. (Patient taking differently: Take 25 mg by mouth 2 (two) times daily.) 180 tablet 3   HYDROcodone-acetaminophen (NORCO/VICODIN) 5-325 MG tablet Take 1 tablet by mouth every 6 (six) hours as needed for moderate pain. (Patient not taking: Reported on 09/03/2021) 10 tablet 0   ibuprofen (ADVIL) 200 MG tablet Take 400 mg by mouth every 6 (six) hours as needed for headache or moderate pain. (Patient not taking: Reported on  09/03/2021)       isosorbide mononitrate (IMDUR) 30 MG 24 hr tablet Take 1 tablet (30 mg total) by mouth daily. 90 tablet 3   labetalol (NORMODYNE) 100 MG tablet Take 2 tablets (200 mg total) by mouth every morning. (Patient taking differently: Take 100 mg by mouth 2 (two) times daily.) 180 tablet 3   Nutritional Supplements (ENSURE CLEAR) LIQD Take 1 Bottle by mouth in the morning and at bedtime.       Probiotic Product (PROBIOTIC PO) Take 1 capsule by mouth daily.       tamsulosin (FLOMAX) 0.4 MG CAPS capsule Take 0.4 mg by mouth daily.        No current facility-administered medications for this visit.           Family History  Problem Relation Age of Onset   Heart disease Mother     Heart attack Sister 34      Social History         Socioeconomic History   Marital status: Married      Spouse name: Not on file   Number of children: Not on file   Years of education: Not on file   Highest education level: Not on file  Occupational History   Not on file  Tobacco Use   Smoking status: Every Day      Types: Cigars, Cigarettes   Smokeless tobacco: Current   Tobacco comments:      3 black and mild a day  Vaping Use   Vaping Use: Never used  Substance and Sexual Activity   Alcohol use: Yes      Alcohol/week: 14.0 standard drinks      Types: 14 Cans of beer per week   Drug use: Not Currently      Frequency: 1.0 times per week      Types: Cocaine, Marijuana      Comment: marjuana-1 time a week for appetite. 07/12/21  no cocaine for 2 months   Sexual activity: Yes  Other Topics Concern   Not on file  Social History Narrative   Not on file    Social Determinants of Health    Financial Resource Strain: Not on file  Food Insecurity: Not on file  Transportation Needs: Not on file  Physical Activity: Not on file  Stress: Not on file  Social Connections: Not on file  Intimate Partner Violence: Not on file        ROS: '[x]'$  Positive   '[ ]'$   Negative   '[ ]'$  All sytems reviewed  and are negative   Cardiac: '[]'$  chest pain/pressure '[]'$  SOB '[]'$  DOE   Vascular: '[]'$  pain in legs while walking '[]'$  pain in feet when lying flat '[]'$  hx of DVT '[]'$  swelling in legs   Pulmonary: '[]'$  asthma '[]'$  wheezing   Neurologic: '[]'$  weakness in '[]'$  arms '[]'$  legs '[]'$  numbness in '[]'$  arms '[]'$  legs '[]'$ difficulty speaking or slurred speech   Hematologic: '[]'$  bleeding problems   GI '[]'$  GERD   GU: '[x]'$  CKD/renal failure  '[x]'$  HD---'[]'$  M/W/F '[x]'$  T/T/S   Psychiatric: '[]'$  hx of major depression   Integumentary: '[]'$  rashes '[]'$  ulcers   Constitutional: '[]'$  fever '[]'$  chills     PHYSICAL EXAMINATION:      Today's Vitals    10/16/21 1021  BP: (!) 182/77  Pulse: 65  Temp: (!) 96.7 F (35.9 C)  Weight: 151 lb 11.2 oz (68.8 kg)  Height: '5\' 10"'$  (1.778 m)    Body mass index is 21.77 kg/m.       General:  WDWN male in NAD Gait: Not observed HENT: WNL Pulmonary: normal non-labored breathing  Cardiac: regular, without carotid bruits Abdomen: soft, NT, no masses Skin: well healed incision left AC space Vascular Exam/Pulses:    Right Left  Radial 2+ (normal) 2+ (normal)  Ulnar 2+ (normal) 2+ (normal)    Extremities:  thrill not present LUA AVF        Neurologic: A&O X 3; Speech is fluent/normal   Non-Invasive Vascular Imaging:   Upper Extremity Vein Mapping on 10/16/2021: +-----------------+-------------+----------+--------------+   Right Cephalic    Diameter (cm) Depth (cm)    Findings      +-----------------+-------------+----------+--------------+   Prox upper arm        0.14                                  +-----------------+-------------+----------+--------------+   Mid upper arm                              not visualized   +-----------------+-------------+----------+--------------+   Dist upper arm                             not visualized   +-----------------+-------------+----------+--------------+   Antecubital fossa     0.15                                   +-----------------+-------------+----------+--------------+   Prox forearm          0.13                                  +-----------------+-------------+----------+--------------+   Mid forearm           0.18                                  +-----------------+-------------+----------+--------------+   Dist forearm          0.14                                  +-----------------+-------------+----------+--------------+   +-----------------+-------------+----------+--------------+  Right Basilic     Diameter (cm) Depth (cm)    Findings      +-----------------+-------------+----------+--------------+   Shoulder                                   not visualized   +-----------------+-------------+----------+--------------+   Prox upper arm                             not visualized   +-----------------+-------------+----------+--------------+   Mid upper arm         0.23                                  +-----------------+-------------+----------+--------------+   Dist upper arm        0.26                                  +-----------------+-------------+----------+--------------+   Antecubital fossa     0.30                                  +-----------------+-------------+----------+--------------+   Prox forearm          0.18                                  +-----------------+-------------+----------+--------------+   Mid forearm                                not visualized   +-----------------+-------------+----------+--------------+   Distal forearm                             not visualized   +-----------------+-------------+----------+--------------+   +-----------------+-------------+----------+--------------+   Left Cephalic     Diameter (cm) Depth (cm)    Findings      +-----------------+-------------+----------+--------------+   Prox upper arm                             not visualized   +-----------------+-------------+----------+--------------+   Mid upper arm                               not visualized   +-----------------+-------------+----------+--------------+   Dist upper arm                             not visualized   +-----------------+-------------+----------+--------------+   Antecubital fossa                          not visualized   +-----------------+-------------+----------+--------------+   Prox forearm                                 thrombosed     +-----------------+-------------+----------+--------------+   Mid forearm  thrombosed     +-----------------+-------------+----------+--------------+   Dist forearm                                    AVF         +-----------------+-------------+----------+--------------+   +-----------------+-------------+----------+--------+   Left Basilic      Diameter (cm) Depth (cm) Findings   +-----------------+-------------+----------+--------+   Dist upper arm        0.29                            +-----------------+-------------+----------+--------+   Antecubital fossa     0.21                            +-----------------+-------------+----------+--------+   Prox forearm          0.11                            +-----------------+-------------+----------+--------+   Upper extremity arterial duplex 10/16/2021: Right Pre-Dialysis Findings:  +-----------------------+----------+--------------------+---------+--------   Location                PSV (cm/s) Intralum. Diam. (cm) Waveform   Comments   +-----------------------+----------+--------------------+---------+--------   Brachial Antecub. fossa 86         0.46                 triphasic           +-----------------------+----------+--------------------+---------+--------   Radial Art at Wrist     76         0.29                 triphasic           +-----------------------+----------+--------------------+---------+--------   Ulnar Art at Wrist      76         0.27                 triphasic            +-----------------------+----------+--------------------+---------+--------     Left Pre-Dialysis Findings:  +-----------------------+----------+--------------------+---------+--------   Location                PSV (cm/s) Intralum. Diam. (cm) Waveform   Comments   +-----------------------+----------+--------------------+---------+--------   Brachial Antecub. fossa 68         0.55                 triphasic           +-----------------------+----------+--------------------+---------+--------   Radial Art at Wrist     58         0.30                 triphasic           +-----------------------+----------+--------------------+---------+--------   Ulnar Art at Wrist      0          65.00                triphasic           +-----------------------+----------+--------------------+---------+--------   ASSESSMENT/PLAN: 66 y.o. male with thrombosed left RC AVF and here for evaluation for new hemodialysis access    -pt is now on HD as his first treatment was yesterday via [facility] that was placed at [facility].   -  pt is on dialysis T/T/S -discussed with pt that access does not last forever and will need intervention or even new access at some point.  -Discussed pt's arterial and duplex study with Dr. Donzetta Matters and will plan for left arm fistula vs left arm AVG.  Discussed with pt that we would look at the basilic vein in the OR with u/s and if adequate, we would do a 1st stage.  He is aware that if the vein matures, he would need a 2nd stage operation.  He is also aware if vein is not adequate, he would require a graft.   -discussed with pt that access may not mature, may need future interventions or even fail and need new access.       Leontine Locket, [facility] [phone]   Clinic MD:   Donzetta Matters

## 2021-10-18 NOTE — Anesthesia Procedure Notes (Signed)
Procedure Name: [place] ?Date/Time: 10/18/2021 9:12 AM ?Performed by: Jenne Campus, CRNA ?Pre-anesthesia Checklist: Patient identified, Emergency Drugs available, Suction available and Patient being monitored ?Oxygen Delivery Method: Simple face mask ? ? ? ? ?

## 2021-10-18 NOTE — Anesthesia Procedure Notes (Signed)
Anesthesia Regional Block: Interscalene brachial plexus block  ? ?Pre-Anesthetic Checklist: , timeout performed,  Correct Patient, Correct Site, Correct Laterality,  Correct Procedure, Correct Position, site marked,  Risks and benefits discussed,  Surgical consent,  Pre-op evaluation,  At surgeon's request and post-op pain management ? ?Laterality: Upper and Left ? ?Prep: Maximum Sterile Barrier Precautions used, chloraprep     ?  ?Needles:  ?Injection technique: Single-shot ? ?Needle Type: Echogenic Needle   ? ? ?Needle Length: 5cm  ?Needle Gauge: 21  ? ? ? ?Additional Needles: ? ? ?Procedures:,,,, ultrasound used (permanent image in chart),,    ?Narrative:  ?Start time: 10/18/2021 8:37 AM ?End time: 10/18/2021 8:44 AM ?Injection made incrementally with aspirations every 5 mL. ? ?Performed by: Personally  ?Anesthesiologist: Barnet Glasgow, MD ? ?Additional Notes: ?Block assessed prior to procedure. Patient tolerated procedure well. ? ? ? ? ?

## 2021-10-19 ENCOUNTER — Other Ambulatory Visit: Payer: Self-pay | Admitting: Vascular Surgery

## 2021-10-19 ENCOUNTER — Encounter (HOSPITAL_COMMUNITY): Payer: Self-pay | Admitting: Vascular Surgery

## 2021-10-19 MED ORDER — HYDROCODONE-ACETAMINOPHEN 5-325 MG PO TABS: 1.0000 | ORAL_TABLET | Freq: Four times a day (QID) | ORAL | 0 refills | Status: DC | PRN

## 2021-10-21 ENCOUNTER — Telehealth: Payer: Self-pay

## 2021-10-21 ENCOUNTER — Other Ambulatory Visit: Payer: Self-pay | Admitting: Vascular Surgery

## 2021-10-21 MED ORDER — HYDROCODONE-ACETAMINOPHEN 5-325 MG PO TABS: 1.0000 | ORAL_TABLET | Freq: Four times a day (QID) | ORAL | 0 refills | Status: DC | PRN

## 2021-10-21 NOTE — Telephone Encounter (Signed)
Pt called to say [facility] does not have his pain meds but wants it sent to either [facility] or [facility].  Dr. Carlis Abbott paged and will send electronically.  Pt aware. ?

## 2021-11-20 ENCOUNTER — Telehealth: Payer: Self-pay

## 2021-11-20 NOTE — Telephone Encounter (Signed)
NOTES SCANNED TO REFERRAL 

## 2021-11-28 ENCOUNTER — Other Ambulatory Visit: Payer: Self-pay

## 2021-11-28 DIAGNOSIS — N186 End stage renal disease: Secondary | ICD-10-CM

## 2021-12-03 ENCOUNTER — Encounter: Payer: No Typology Code available for payment source | Admitting: Vascular Surgery

## 2021-12-03 ENCOUNTER — Inpatient Hospital Stay (HOSPITAL_COMMUNITY): Admission: RE | Admit: 2021-12-03 | Payer: No Typology Code available for payment source | Source: Ambulatory Visit

## 2022-01-07 ENCOUNTER — Ambulatory Visit (INDEPENDENT_AMBULATORY_CARE_PROVIDER_SITE_OTHER): Payer: No Typology Code available for payment source | Admitting: Vascular Surgery

## 2022-01-07 ENCOUNTER — Ambulatory Visit (HOSPITAL_COMMUNITY)
Admission: RE | Admit: 2022-01-07 | Discharge: 2022-01-07 | Disposition: A | Discharge status: Home / Self Care | Payer: No Typology Code available for payment source | Source: Ambulatory Visit | Attending: Vascular Surgery | Admitting: Vascular Surgery

## 2022-01-07 ENCOUNTER — Encounter: Payer: Self-pay | Admitting: Vascular Surgery

## 2022-01-07 DIAGNOSIS — Z992 Dependence on renal dialysis: Secondary | ICD-10-CM | POA: Insufficient documentation

## 2022-01-07 DIAGNOSIS — N186 End stage renal disease: Secondary | ICD-10-CM | POA: Insufficient documentation

## 2022-01-07 NOTE — Progress Notes (Signed)
Patient name: Ronald Reeves MRN: [id number] DOB: July 09, 1956 Sex: male  REASON FOR CONSULT: Postop check after left first stage basilic vein fistula  HPI: Ronald Reeves is a 66 y.o. male, with end-stage renal disease on dialysis Tuesday, Thursday Saturday that presents for postop check after left first stage basilic vein fistula that was done on 10/18/2021.  He previously had a thrombosed left radiocephalic after sidebranch ligation last year.  He has a little bit of numbness in the left hand that is tolerable.  His incision has healed.  Ready to proceed with step two.  Past Medical History:  Diagnosis Date   Anemia    Arthritis    Back pain    Bronchitis    Chronic kidney disease    Coronary artery disease    GERD (gastroesophageal reflux disease)    not a current problem   Heart murmur    Hypertension    Myocardial infarction ([place])    Pre-diabetes    does not have CBG machine.   Substance abuse [facility])     Past Surgical History:  Procedure Laterality Date   AV FISTULA PLACEMENT Left 04/12/2021   Procedure: LEFT ARM ARTERIOVENOUS (AV) FISTULA CREATION;  Surgeon: Marty Heck, MD;  Location: [place];  Service: Vascular;  Laterality: Left;   AV FISTULA PLACEMENT Left 10/18/2021   Procedure: LEFT ARM BRACHIOBASILIC FISTULA CREATION FIRST STAGE;  Surgeon: Marty Heck, MD;  Location: [place];  Service: Vascular;  Laterality: Left;   BACK SURGERY     LIGATION OF COMPETING BRANCHES OF ARTERIOVENOUS FISTULA Left 07/15/2021   Procedure: LIGATION OF COMPETING BRANCHES OF LEFT RADIOCEPHALIC ARTERIOVENOUS FISTULA TIMES TWO;  Surgeon: Marty Heck, MD;  [facility];  Service: Vascular;  Laterality: Left;   REVISON OF ARTERIOVENOUS FISTULA Left 07/15/2021   Procedure: REVISON OF LEFT ARTERIOVENOUS FISTULA;  Surgeon: Marty Heck, MD;  Location: [place];  Service: Vascular;  Laterality: Left;    Family History  Problem Relation Age of Onset   Heart disease Mother     Heart attack Sister 72    SOCIAL HISTORY: Social History   Socioeconomic History   Marital status: Married    Spouse name: Not on file   Number of children: Not on file   Years of education: Not on file   Highest education level: Not on file  Occupational History   Not on file  Tobacco Use   Smoking status: Every Day    Types: Cigars   Smokeless tobacco: Current   Tobacco comments:    3 black and mild a day  Vaping Use   Vaping Use: Never used  Substance and Sexual Activity   Alcohol use: Yes    Alcohol/week: 14.0 standard drinks    Types: 14 Cans of beer per week    Comment: Daily   Drug use: Not Currently    Frequency: 1.0 times per week    Types: Cocaine, Marijuana    Comment: marijuana daily   Sexual activity: Yes  Other Topics Concern   Not on file  Social History Narrative   Not on file   Social Determinants of Health   Financial Resource Strain: Not on file  Food Insecurity: Not on file  Transportation Needs: Not on file  Physical Activity: Not on file  Stress: Not on file  Social Connections: Not on file  Intimate Partner Violence: Not on file    No Known Allergies  Current Outpatient Medications  Medication Sig  Dispense Refill   amLODipine (NORVASC) 10 MG tablet Take 1 tablet (10 mg total) by mouth daily. 90 tablet 3   aspirin EC 81 MG tablet Take 81 mg by mouth daily.     calcitRIOL (ROCALTROL) 0.5 MCG capsule Take 0.5 mcg by mouth daily.     diclofenac Sodium (VOLTAREN) 1 % GEL Apply 2 g topically 3 (three) times daily as needed (pain).     furosemide (LASIX) 20 MG tablet Take 10 mg by mouth daily.     hydrALAZINE (APRESOLINE) 25 MG tablet Take 2 tablets (50 mg total) by mouth every morning. (Patient taking differently: Take 25 mg by mouth 2 (two) times daily.) 180 tablet 3   HYDROcodone-acetaminophen (NORCO) 5-325 MG tablet Take 1 tablet by mouth every 6 (six) hours as needed for moderate pain. 20 tablet 0   isosorbide mononitrate (IMDUR) 30 MG  24 hr tablet Take 1 tablet (30 mg total) by mouth daily. 90 tablet 3   labetalol (NORMODYNE) 100 MG tablet Take 2 tablets (200 mg total) by mouth every morning. (Patient taking differently: Take 100 mg by mouth 2 (two) times daily.) 180 tablet 3   Nutritional Supplements (ENSURE CLEAR) LIQD Take 1 Bottle by mouth in the morning and at bedtime.     Probiotic Product (PROBIOTIC PO) Take 1 capsule by mouth daily.     tamsulosin (FLOMAX) 0.4 MG CAPS capsule Take 0.4 mg by mouth daily.     No current facility-administered medications for this visit.    REVIEW OF SYSTEMS:  '[X]'$  denotes positive finding, '[ ]'$  denotes negative finding Cardiac  Comments:  Chest pain or chest pressure:    Shortness of breath upon exertion:    Short of breath when lying flat:    Irregular heart rhythm:        Vascular    Pain in calf, thigh, or hip brought on by ambulation:    Pain in feet at night that wakes you up from your sleep:     Blood clot in your veins:    Leg swelling:         Pulmonary    Oxygen at home:    Productive cough:     Wheezing:         Neurologic    Sudden weakness in arms or legs:     Sudden numbness in arms or legs:     Sudden onset of difficulty speaking or slurred speech:    Temporary loss of vision in one eye:     Problems with dizziness:         Gastrointestinal    Blood in stool:     Vomited blood:         Genitourinary    Burning when urinating:     Blood in urine:        Psychiatric    Major depression:         Hematologic    Bleeding problems:    Problems with blood clotting too easily:        Skin    Rashes or ulcers:        Constitutional    Fever or chills:      PHYSICAL EXAM: Vitals:   01/07/22 1534  BP: (!) 152/72  Pulse: 66  Resp: 14  Temp: 98.4 F (36.9 C)  TempSrc: Temporal  SpO2: 97%  Weight: 141 lb (64 kg)  Height: '5\' 10"'$  (1.778 m)    GENERAL: The patient is a well-nourished male,  in no acute distress. The vital signs are documented  above. CARDIAC: There is a regular rate and rhythm.  VASCULAR:  Left brachiobasilic fistula with excellent thrill Left radial pulse palpable Left arm incision healed   DATA:   Left arm fistula duplex shows 6 mm vein with excellent flow volumes  Assessment/Plan:  66 year old male with end-stage renal disease that presents for postop check after left first stage basilic vein fistula on 3/77/9396.  This has matured nicely and measures over 6 mm with great flow volumes on duplex today.  Excellent thrill on exam.  Discussed plan for second stage transposition in the OR.  All questions answered.  We will get him scheduled.  Risks and benefits discussed.   Marty Heck, MD [facility] [place] Office: [phone]

## 2022-01-15 ENCOUNTER — Other Ambulatory Visit: Payer: Self-pay

## 2022-01-15 DIAGNOSIS — N186 End stage renal disease: Secondary | ICD-10-CM

## 2022-01-24 ENCOUNTER — Encounter (HOSPITAL_COMMUNITY): Payer: Self-pay | Admitting: Vascular Surgery

## 2022-01-24 ENCOUNTER — Other Ambulatory Visit: Payer: Self-pay

## 2022-01-24 NOTE — Progress Notes (Signed)
PCP - Collins Scotland, MD Cardiologist - Jule Ser, MD  PPM/ICD - denies Device Orders - n/a Rep Notified - n/a  Chest x-ray - 03/11/2021 EKG - 10/18/2021 Stress Test - denies ECHO - 08/21/2017 Cardiac Cath - denies  CPAP - denies  Fasting Blood Sugar - n/a  Blood Thinner Instructions: n/a Aspirin Instructions: continue Aspirin  ERAS Protcol - n/a  COVID TEST- n/a  Anesthesia review: yes - cardiac history  Patient verbally denies any shortness of breath, fever, cough and chest pain during phone call   -------------  SDW INSTRUCTIONS given:  Your procedure is scheduled on Monday, June 19th, 2023.  Report to [facility] Main Entrance "A" at 09:30 A.M., and check in at the Admitting office.  Call this number if you have problems the morning of surgery:  [phone]   Remember:  Do not eat or drink after midnight the night before your surgery    Take these medicines the morning of surgery with A SIP OF WATER Norvasc, Hydralazine, Imdur, Labetalol, Crestor, Aspirin  As of today, STOP taking any Aspirin (unless otherwise instructed by your surgeon) Aleve, Naproxen, Ibuprofen, Motrin, Advil, Goody's, BC's, all herbal medications, fish oil, and all vitamins.   The day of surgery:                     Do not wear jewelry            Do not wear lotions, powders, colognes, or deodorant.            Men may shave face and neck.            Do not bring valuables to the hospital.            [facility] is not responsible for any belongings or valuables.  Do NOT Smoke (Tobacco/Vaping) 24 hours prior to your procedure If you use a CPAP at night, you may bring all equipment for your overnight stay.   Contacts, glasses, dentures or bridgework may not be worn into surgery.      For patients admitted to the hospital, discharge time will be determined by your treatment team.   Patients discharged the day of surgery will not be allowed to drive home, and someone needs to stay with  them for 24 hours.  Special instructions:   [place]- Preparing For Surgery  Before surgery, you can play an important role. Because skin is not sterile, your skin needs to be as free of germs as possible. You can reduce the number of germs on your skin by washing with CHG (chlorahexidine gluconate) Soap before surgery.  CHG is an antiseptic cleaner which kills germs and bonds with the skin to continue killing germs even after washing.    Oral Hygiene is also important to reduce your risk of infection.  Remember - BRUSH YOUR TEETH THE MORNING OF SURGERY WITH YOUR REGULAR TOOTHPASTE  Please do not use if you have an allergy to CHG or antibacterial soaps. If your skin becomes reddened/irritated stop using the CHG.  Do not shave (including legs and underarms) for at least 48 hours prior to first CHG shower. It is OK to shave your face.  Please follow these instructions carefully.   Shower the NIGHT BEFORE SURGERY and the MORNING OF SURGERY with DIAL Soap.   Pat yourself dry with a CLEAN TOWEL.  Wear CLEAN PAJAMAS to bed the night before surgery  Place CLEAN SHEETS on your bed the night of your  first shower and DO NOT SLEEP WITH PETS.   Day of Surgery: Please shower morning of surgery  Wear Clean/Comfortable clothing the morning of surgery Do not apply any deodorants/lotions.   Remember to brush your teeth WITH YOUR REGULAR TOOTHPASTE.   Questions were answered. Patient verbalized understanding of instructions.

## 2022-01-27 ENCOUNTER — Encounter (HOSPITAL_COMMUNITY): Payer: Self-pay | Admitting: Vascular Surgery

## 2022-01-27 ENCOUNTER — Other Ambulatory Visit: Payer: Self-pay

## 2022-01-27 ENCOUNTER — Ambulatory Visit (HOSPITAL_BASED_OUTPATIENT_CLINIC_OR_DEPARTMENT_OTHER): Payer: No Typology Code available for payment source | Admitting: Physician Assistant

## 2022-01-27 ENCOUNTER — Telehealth: Payer: Self-pay

## 2022-01-27 ENCOUNTER — Ambulatory Visit (HOSPITAL_COMMUNITY)
Admission: RE | Admit: 2022-01-27 | Discharge: 2022-01-27 | Disposition: A | Discharge status: Home / Self Care | Payer: No Typology Code available for payment source | Attending: Vascular Surgery | Admitting: Vascular Surgery

## 2022-01-27 ENCOUNTER — Encounter (HOSPITAL_COMMUNITY)
Admission: RE | Disposition: A | Discharge status: Home / Self Care | Payer: Self-pay | Source: Home / Self Care | Attending: Vascular Surgery

## 2022-01-27 ENCOUNTER — Ambulatory Visit (HOSPITAL_COMMUNITY): Payer: No Typology Code available for payment source | Admitting: Physician Assistant

## 2022-01-27 DIAGNOSIS — I2510 Atherosclerotic heart disease of native coronary artery without angina pectoris: Secondary | ICD-10-CM

## 2022-01-27 DIAGNOSIS — I120 Hypertensive chronic kidney disease with stage 5 chronic kidney disease or end stage renal disease: Secondary | ICD-10-CM

## 2022-01-27 DIAGNOSIS — E1122 Type 2 diabetes mellitus with diabetic chronic kidney disease: Secondary | ICD-10-CM | POA: Diagnosis not present

## 2022-01-27 DIAGNOSIS — Z992 Dependence on renal dialysis: Secondary | ICD-10-CM | POA: Diagnosis not present

## 2022-01-27 DIAGNOSIS — G4730 Sleep apnea, unspecified: Secondary | ICD-10-CM | POA: Diagnosis not present

## 2022-01-27 DIAGNOSIS — N186 End stage renal disease: Secondary | ICD-10-CM | POA: Insufficient documentation

## 2022-01-27 DIAGNOSIS — F17290 Nicotine dependence, other tobacco product, uncomplicated: Secondary | ICD-10-CM | POA: Diagnosis not present

## 2022-01-27 DIAGNOSIS — M1990 Unspecified osteoarthritis, unspecified site: Secondary | ICD-10-CM | POA: Insufficient documentation

## 2022-01-27 DIAGNOSIS — K219 Gastro-esophageal reflux disease without esophagitis: Secondary | ICD-10-CM | POA: Insufficient documentation

## 2022-01-27 DIAGNOSIS — I252 Old myocardial infarction: Secondary | ICD-10-CM | POA: Diagnosis not present

## 2022-01-27 DIAGNOSIS — N185 Chronic kidney disease, stage 5: Secondary | ICD-10-CM | POA: Diagnosis not present

## 2022-01-27 HISTORY — PX: BASCILIC VEIN TRANSPOSITION: SHX5742

## 2022-01-27 LAB — POCT I-STAT, CHEM 8
BUN: 32 mg/dL — ABNORMAL HIGH (ref 8–23)
Calcium, Ion: 1.08 mmol/L — ABNORMAL LOW (ref 1.15–1.40)
Chloride: 101 mmol/L (ref 98–111)
Creatinine, Ser: 5.9 mg/dL — ABNORMAL HIGH (ref 0.61–1.24)
Glucose, Bld: 102 mg/dL — ABNORMAL HIGH (ref 70–99)
HCT: 38 % — ABNORMAL LOW (ref 39.0–52.0)
Hemoglobin: 12.9 g/dL — ABNORMAL LOW (ref 13.0–17.0)
Potassium: 3.6 mmol/L (ref 3.5–5.1)
Sodium: 138 mmol/L (ref 135–145)
TCO2: 26 mmol/L (ref 22–32)

## 2022-01-27 SURGERY — TRANSPOSITION, VEIN, BASILIC
Anesthesia: General | Laterality: Left

## 2022-01-27 MED ORDER — HEPARIN SODIUM (PORCINE) 1000 UNIT/ML IJ SOLN
INTRAMUSCULAR | Status: DC | PRN
  Administered 2022-01-27: 3000 [IU] via INTRAVENOUS

## 2022-01-27 MED ORDER — EPHEDRINE SULFATE-NACL 50-0.9 MG/10ML-% IV SOSY
PREFILLED_SYRINGE | INTRAVENOUS | Status: DC | PRN
  Administered 2022-01-27: 10 mg via INTRAVENOUS
  Administered 2022-01-27: 5 mg via INTRAVENOUS
  Administered 2022-01-27: 10 mg via INTRAVENOUS

## 2022-01-27 MED ORDER — ORAL CARE MOUTH RINSE: 15.0000 mL | Freq: Once | OROMUCOSAL | Status: AC

## 2022-01-27 MED ORDER — PROPOFOL 10 MG/ML IV BOLUS
INTRAVENOUS | Status: AC
  Filled 2022-01-27: qty 20

## 2022-01-27 MED ORDER — ONDANSETRON HCL 4 MG/2ML IJ SOLN: 4.0000 mg | Freq: Once | INTRAMUSCULAR | Status: DC | PRN

## 2022-01-27 MED ORDER — 0.9 % SODIUM CHLORIDE (POUR BTL) OPTIME
TOPICAL | Status: DC | PRN
  Administered 2022-01-27: 1000 mL

## 2022-01-27 MED ORDER — FENTANYL CITRATE (PF) 250 MCG/5ML IJ SOLN
INTRAMUSCULAR | Status: AC
  Filled 2022-01-27: qty 5

## 2022-01-27 MED ORDER — FENTANYL CITRATE (PF) 100 MCG/2ML IJ SOLN: 25.0000 ug | INTRAMUSCULAR | Status: DC | PRN

## 2022-01-27 MED ORDER — PROPOFOL 10 MG/ML IV BOLUS
INTRAVENOUS | Status: DC | PRN
  Administered 2022-01-27: 90 mg via INTRAVENOUS

## 2022-01-27 MED ORDER — CHLORHEXIDINE GLUCONATE 0.12 % MT SOLN
15.0000 mL | Freq: Once | OROMUCOSAL | Status: AC
  Administered 2022-01-27: 15 mL via OROMUCOSAL
  Filled 2022-01-27: qty 15

## 2022-01-27 MED ORDER — CHLORHEXIDINE GLUCONATE 4 % EX LIQD: 60.0000 mL | Freq: Once | CUTANEOUS | Status: DC

## 2022-01-27 MED ORDER — PHENYLEPHRINE 80 MCG/ML (10ML) SYRINGE FOR IV PUSH (FOR BLOOD PRESSURE SUPPORT)
PREFILLED_SYRINGE | INTRAVENOUS | Status: DC | PRN
  Administered 2022-01-27 (×6): 80 ug via INTRAVENOUS

## 2022-01-27 MED ORDER — ACETAMINOPHEN 500 MG PO TABS
1000.0000 mg | ORAL_TABLET | Freq: Once | ORAL | Status: AC
  Administered 2022-01-27: 1000 mg via ORAL
  Filled 2022-01-27: qty 2

## 2022-01-27 MED ORDER — SODIUM CHLORIDE 0.9 % IV SOLN: INTRAVENOUS | Status: DC

## 2022-01-27 MED ORDER — LIDOCAINE 2% (20 MG/ML) 5 ML SYRINGE
INTRAMUSCULAR | Status: DC | PRN
  Administered 2022-01-27: 80 mg via INTRAVENOUS

## 2022-01-27 MED ORDER — CEFAZOLIN SODIUM-DEXTROSE 2-4 GM/100ML-% IV SOLN
2.0000 g | INTRAVENOUS | Status: DC
  Filled 2022-01-27: qty 100

## 2022-01-27 MED ORDER — FENTANYL CITRATE (PF) 250 MCG/5ML IJ SOLN
INTRAMUSCULAR | Status: DC | PRN
  Administered 2022-01-27 (×2): 25 ug via INTRAVENOUS

## 2022-01-27 MED ORDER — OXYCODONE-ACETAMINOPHEN 5-325 MG PO TABS: 1.0000 | ORAL_TABLET | Freq: Four times a day (QID) | ORAL | 0 refills | Status: DC | PRN

## 2022-01-27 MED ORDER — HEPARIN 6000 UNIT IRRIGATION SOLUTION
Status: DC | PRN
  Administered 2022-01-27: 1

## 2022-01-27 SURGICAL SUPPLY — 44 items
ADH SKN CLS APL DERMABOND .7 (GAUZE/BANDAGES/DRESSINGS) ×1
ARMBAND PINK RESTRICT EXTREMIT (MISCELLANEOUS) ×2 IMPLANT
BAG COUNTER SPONGE SURGICOUNT (BAG) ×2 IMPLANT
BAG SPNG CNTER NS LX DISP (BAG) ×1
BNDG ELASTIC 3X5.8 VLCR STR LF (GAUZE/BANDAGES/DRESSINGS) ×1 IMPLANT
BNDG GAUZE DERMACEA FLUFF (GAUZE/BANDAGES/DRESSINGS) ×1
BNDG GAUZE DERMACEA FLUFF 4 (GAUZE/BANDAGES/DRESSINGS) IMPLANT
BNDG GZE DERMACEA 4 6PLY (GAUZE/BANDAGES/DRESSINGS) ×1
CANISTER SUCT 3000ML PPV (MISCELLANEOUS) ×2 IMPLANT
CLIP VESOCCLUDE MED 24/CT (CLIP) ×2 IMPLANT
CLIP VESOCCLUDE SM WIDE 24/CT (CLIP) ×2 IMPLANT
COVER PROBE W GEL 5X96 (DRAPES) ×2 IMPLANT
DERMABOND ADVANCED (GAUZE/BANDAGES/DRESSINGS) ×1
DERMABOND ADVANCED .7 DNX12 (GAUZE/BANDAGES/DRESSINGS) ×1 IMPLANT
DRAPE IMP U-DRAPE 54X76 (DRAPES) ×1 IMPLANT
ELECT REM PT RETURN 9FT ADLT (ELECTROSURGICAL) ×2
ELECTRODE REM PT RTRN 9FT ADLT (ELECTROSURGICAL) ×1 IMPLANT
GLOVE BIO SURGEON STRL SZ7.5 (GLOVE) ×2 IMPLANT
GLOVE BIOGEL PI IND STRL 8 (GLOVE) ×1 IMPLANT
GLOVE BIOGEL PI INDICATOR 8 (GLOVE) ×1
GOWN STRL REUS W/ TWL LRG LVL3 (GOWN DISPOSABLE) ×2 IMPLANT
GOWN STRL REUS W/ TWL XL LVL3 (GOWN DISPOSABLE) ×2 IMPLANT
GOWN STRL REUS W/TWL LRG LVL3 (GOWN DISPOSABLE) ×4
GOWN STRL REUS W/TWL XL LVL3 (GOWN DISPOSABLE) ×4
KIT BASIN OR (CUSTOM PROCEDURE TRAY) ×2 IMPLANT
KIT TURNOVER KIT B (KITS) ×2 IMPLANT
NDL HYPO 25GX1X1/2 BEV (NEEDLE) ×1 IMPLANT
NEEDLE HYPO 25GX1X1/2 BEV (NEEDLE) ×2 IMPLANT
NS IRRIG 1000ML POUR BTL (IV SOLUTION) ×2 IMPLANT
PACK CV ACCESS (CUSTOM PROCEDURE TRAY) ×2 IMPLANT
PAD ARMBOARD 7.5X6 YLW CONV (MISCELLANEOUS) ×4 IMPLANT
POWDER SURGICEL 3.0 GRAM (HEMOSTASIS) ×1 IMPLANT
SLING ARM FOAM STRAP LRG (SOFTGOODS) IMPLANT
SLING ARM FOAM STRAP MED (SOFTGOODS) IMPLANT
SPIKE FLUID TRANSFER (MISCELLANEOUS) ×2 IMPLANT
SUT MNCRL AB 4-0 PS2 18 (SUTURE) ×5 IMPLANT
SUT PROLENE 6 0 BV (SUTURE) ×3 IMPLANT
SUT PROLENE 7 0 BV 1 (SUTURE) IMPLANT
SUT SILK 2 0 SH (SUTURE) IMPLANT
SUT VIC AB 3-0 SH 27 (SUTURE) ×14
SUT VIC AB 3-0 SH 27X BRD (SUTURE) ×2 IMPLANT
TOWEL GREEN STERILE (TOWEL DISPOSABLE) ×2 IMPLANT
UNDERPAD 30X36 HEAVY ABSORB (UNDERPADS AND DIAPERS) ×2 IMPLANT
WATER STERILE IRR 1000ML POUR (IV SOLUTION) ×2 IMPLANT

## 2022-01-27 NOTE — Anesthesia Preprocedure Evaluation (Signed)
Anesthesia Evaluation  Patient identified by MRN, date of birth, ID band Patient awake    Reviewed: Allergy & Precautions, NPO status , Patient's Chart, lab work & pertinent test results, reviewed documented beta blocker date and time   History of Anesthesia Complications Negative for: history of anesthetic complications  Airway Mallampati: II  TM Distance: >3 FB Neck ROM: Full    Dental  (+) Dental Advisory Given, Poor Dentition, Missing, Chipped   Pulmonary sleep apnea (does not use CPAP) , Current Smoker and Patient abstained from smoking.,    breath sounds clear to auscultation       Cardiovascular hypertension, Pt. on medications and Pt. on home beta blockers (-) angina+ CAD and + Past MI   Rhythm:Regular Rate:Normal  '19 stress: large inferior scar, EF 38%, no reversibility '19 ECHO: EF 45-50%, basal infero-lateral, inferior hypokinesis, mild MR   Neuro/Psych PSYCHIATRIC DISORDERS Depression  Neuromuscular disease negative neurological ROS     GI/Hepatic Neg liver ROS, GERD  Controlled,  Endo/Other  negative endocrine ROS  Renal/GU ESRF and DialysisRenal disease (K+ 3.6; TTHSAT)     Musculoskeletal  (+) Arthritis ,   Abdominal   Peds  Hematology  (+) Blood dyscrasia, anemia ,   Anesthesia Other Findings Day of surgery medications reviewed with the patient.  Reproductive/Obstetrics                             Anesthesia Physical  Anesthesia Plan  ASA: 3  Anesthesia Plan: General   Post-op Pain Management: Tylenol PO (pre-op)*   Induction: Intravenous  PONV Risk Score and Plan: 1 and Dexamethasone and Ondansetron  Airway Management Planned: LMA  Additional Equipment: None  Intra-op Plan:   Post-operative Plan:   Informed Consent: I have reviewed the patients History and Physical, chart, labs and discussed the procedure including the risks, benefits and alternatives for  the proposed anesthesia with the patient or authorized representative who has indicated his/her understanding and acceptance.     Dental advisory given  Plan Discussed with: CRNA  Anesthesia Plan Comments:         Anesthesia Quick Evaluation

## 2022-01-27 NOTE — H&P (Signed)
History and Physical Interval Note:  01/27/2022 11:51 AM  Ronald Reeves  has presented today for surgery, with the diagnosis of END STAGE RENAL DISEASE.  The various methods of treatment have been discussed with the patient and family. After consideration of risks, benefits and other options for treatment, the patient has consented to  Procedure(s): LEFT SECOND STAGE BASILIC VEIN TRANSPOSITION (Left) as a surgical intervention.  The patient's history has been reviewed, patient examined, no change in status, stable for surgery.  I have reviewed the patient's chart and labs.  Questions were answered to the patient's satisfaction.     Marty Heck  Patient name: Ronald Reeves        MRN: [id number]        DOB: Jul 22, 1956          Sex: male   REASON FOR CONSULT: Postop check after left first stage basilic vein fistula   HPI: Ronald Reeves is a 66 y.o. male, with end-stage renal disease on dialysis Tuesday, Thursday Saturday that presents for postop check after left first stage basilic vein fistula that was done on 10/18/2021.  He previously had a thrombosed left radiocephalic after sidebranch ligation last year.  He has a little bit of numbness in the left hand that is tolerable.  His incision has healed.  Ready to proceed with step two.       Past Medical History:  Diagnosis Date   Anemia     Arthritis     Back pain     Bronchitis     Chronic kidney disease     Coronary artery disease     GERD (gastroesophageal reflux disease)      not a current problem   Heart murmur     Hypertension     Myocardial infarction ([place])     Pre-diabetes      does not have CBG machine.   Substance abuse [facility])             Past Surgical History:  Procedure Laterality Date   AV FISTULA PLACEMENT Left 04/12/2021    Procedure: LEFT ARM ARTERIOVENOUS (AV) FISTULA CREATION;  Surgeon: Marty Heck, MD;  Location: [place];  Service: Vascular;  Laterality: Left;   AV FISTULA PLACEMENT Left 10/18/2021     Procedure: LEFT ARM BRACHIOBASILIC FISTULA CREATION FIRST STAGE;  Surgeon: Marty Heck, MD;  Location: [place];  Service: Vascular;  Laterality: Left;   BACK SURGERY       LIGATION OF COMPETING BRANCHES OF ARTERIOVENOUS FISTULA Left 07/15/2021    Procedure: LIGATION OF COMPETING BRANCHES OF LEFT RADIOCEPHALIC ARTERIOVENOUS FISTULA TIMES TWO;  Surgeon: Marty Heck, MD;  Location: [facility] OR;  Service: Vascular;  Laterality: Left;   REVISON OF ARTERIOVENOUS FISTULA Left 07/15/2021    Procedure: REVISON OF LEFT ARTERIOVENOUS FISTULA;  Surgeon: Marty Heck, MD;  Location: [facility] OR;  Service: Vascular;  Laterality: Left;           Family History  Problem Relation Age of Onset   Heart disease Mother     Heart attack Sister 92      SOCIAL HISTORY: Social History         Socioeconomic History   Marital status: Married      Spouse name: Not on file   Number of children: Not on file   Years of education: Not on file   Highest education level: Not on file  Occupational History   Not on file  Tobacco Use  Smoking status: Every Day      Types: Cigars   Smokeless tobacco: Current   Tobacco comments:      3 black and mild a day  Vaping Use   Vaping Use: Never used  Substance and Sexual Activity   Alcohol use: Yes      Alcohol/week: 14.0 standard drinks      Types: 14 Cans of beer per week      Comment: Daily   Drug use: Not Currently      Frequency: 1.0 times per week      Types: Cocaine, Marijuana      Comment: marijuana daily   Sexual activity: Yes  Other Topics Concern   Not on file  Social History Narrative   Not on file    Social Determinants of Health    Financial Resource Strain: Not on file  Food Insecurity: Not on file  Transportation Needs: Not on file  Physical Activity: Not on file  Stress: Not on file  Social Connections: Not on file  Intimate Partner Violence: Not on file      No Known Allergies         Current Outpatient Medications   Medication Sig Dispense Refill   amLODipine (NORVASC) 10 MG tablet Take 1 tablet (10 mg total) by mouth daily. 90 tablet 3   aspirin EC 81 MG tablet Take 81 mg by mouth daily.       calcitRIOL (ROCALTROL) 0.5 MCG capsule Take 0.5 mcg by mouth daily.       diclofenac Sodium (VOLTAREN) 1 % GEL Apply 2 g topically 3 (three) times daily as needed (pain).       furosemide (LASIX) 20 MG tablet Take 10 mg by mouth daily.       hydrALAZINE (APRESOLINE) 25 MG tablet Take 2 tablets (50 mg total) by mouth every morning. (Patient taking differently: Take 25 mg by mouth 2 (two) times daily.) 180 tablet 3   HYDROcodone-acetaminophen (NORCO) 5-325 MG tablet Take 1 tablet by mouth every 6 (six) hours as needed for moderate pain. 20 tablet 0   isosorbide mononitrate (IMDUR) 30 MG 24 hr tablet Take 1 tablet (30 mg total) by mouth daily. 90 tablet 3   labetalol (NORMODYNE) 100 MG tablet Take 2 tablets (200 mg total) by mouth every morning. (Patient taking differently: Take 100 mg by mouth 2 (two) times daily.) 180 tablet 3   Nutritional Supplements (ENSURE CLEAR) LIQD Take 1 Bottle by mouth in the morning and at bedtime.       Probiotic Product (PROBIOTIC PO) Take 1 capsule by mouth daily.       tamsulosin (FLOMAX) 0.4 MG CAPS capsule Take 0.4 mg by mouth daily.        No current facility-administered medications for this visit.      REVIEW OF SYSTEMS:  '[X]'$  denotes positive finding, '[ ]'$  denotes negative finding Cardiac   Comments:  Chest pain or chest pressure:      Shortness of breath upon exertion:      Short of breath when lying flat:      Irregular heart rhythm:             Vascular      Pain in calf, thigh, or hip brought on by ambulation:      Pain in feet at night that wakes you up from your sleep:       Blood clot in your veins:      Leg swelling:  Pulmonary      Oxygen at home:      Productive cough:       Wheezing:              Neurologic      Sudden weakness in arms or  legs:       Sudden numbness in arms or legs:       Sudden onset of difficulty speaking or slurred speech:      Temporary loss of vision in one eye:       Problems with dizziness:              Gastrointestinal      Blood in stool:       Vomited blood:              Genitourinary      Burning when urinating:       Blood in urine:             Psychiatric      Major depression:              Hematologic      Bleeding problems:      Problems with blood clotting too easily:             Skin      Rashes or ulcers:             Constitutional      Fever or chills:          PHYSICAL EXAM:    Vitals:    01/07/22 1534  BP: (!) 152/72  Pulse: 66  Resp: 14  Temp: 98.4 F (36.9 C)  TempSrc: Temporal  SpO2: 97%  Weight: 141 lb (64 kg)  Height: '5\' 10"'$  (1.778 m)      GENERAL: The patient is a well-nourished male, in no acute distress. The vital signs are documented above. CARDIAC: There is a regular rate and rhythm.  VASCULAR:  Left brachiobasilic fistula with excellent thrill Left radial pulse palpable Left arm incision healed     DATA:    Left arm fistula duplex shows 6 mm vein with excellent flow volumes   Assessment/Plan:   66 year old male with end-stage renal disease that presents for postop check after left first stage basilic vein fistula on 3/79/0240.  This has matured nicely and measures over 6 mm with great flow volumes on duplex today.  Excellent thrill on exam.  Discussed plan for second stage transposition in the OR.  All questions answered.  We will get him scheduled.  Risks and benefits discussed.     Marty Heck, MD [facility] [place] Office: [phone]

## 2022-01-27 NOTE — Discharge Instructions (Signed)
[facility]  Discharge Instructions  AV Fistula or Graft Surgery for Dialysis Access  Please refer to the following instructions for your post-procedure care. Your surgeon or physician assistant will discuss any changes with you.  Activity  You may drive the day following your surgery, if you are comfortable and no longer taking prescription pain medication. Resume full activity as the soreness in your incision resolves.  Bathing/Showering  You may shower after you go home. Keep your incision dry for 48 hours. Do not soak in a bathtub, hot tub, or swim until the incision heals completely. You may not shower if you have a hemodialysis catheter.  Incision Care  Clean your incision with mild soap and water after 48 hours. Pat the area dry with a clean towel. You do not need a bandage unless otherwise instructed. Do not apply any ointments or creams to your incision. You may have skin glue on your incision. Do not peel it off. It will come off on its own in about one week. Your arm may swell a bit after surgery. To reduce swelling use pillows to elevate your arm so it is above your heart. Your doctor will tell you if you need to lightly wrap your arm with an ACE bandage.  Diet  Resume your normal diet. There are not special food restrictions following this procedure. In order to heal from your surgery, it is CRITICAL to get adequate nutrition. Your body requires vitamins, minerals, and protein. Vegetables are the best source of vitamins and minerals. Vegetables also provide the perfect balance of protein. Processed food has little nutritional value, so try to avoid this.  Medications  Resume taking all of your medications. If your incision is causing pain, you may take over-the counter pain relievers such as acetaminophen (Tylenol). If you were prescribed a stronger pain medication, please be aware these medications can cause nausea and constipation. Prevent  nausea by taking the medication with a snack or meal. Avoid constipation by drinking plenty of fluids and eating foods with high amount of fiber, such as fruits, vegetables, and grains.  Do not take Tylenol if you are taking prescription pain medications.  Follow up Your surgeon may want to see you in the office following your access surgery. If so, this will be arranged at the time of your surgery.  Please call us immediately for any of the following conditions:  Increased pain, redness, drainage (pus) from your incision site Fever of 101 degrees or higher Severe or worsening pain at your incision site Hand pain or numbness.  Reduce your risk of vascular disease:  Stop smoking. If you would like help, call [facility] at 1-800-QUIT-NOW 754-468-5992) or [facility] your cholesterol Maintain a desired weight Control your diabetes Keep your blood pressure down  Dialysis  It will take several weeks to several months for your new dialysis access to be ready for use. Your surgeon will determine when it is okay to use it. Your nephrologist will continue to direct your dialysis. You can continue to use your Permcath until your new access is ready for use.   01/27/2022 Ronald Reeves [id number] 04/30/1956  Surgeon(s): Marty Heck, MD  Procedure(s): LEFT SECOND STAGE BASILIC VEIN TRANSPOSITION   May stick graft immediately   May stick graft on designated area only:   x Do not stick fistula for 4 weeks    If you have any questions, please call the office at [phone].

## 2022-01-27 NOTE — Anesthesia Procedure Notes (Signed)
Procedure Name: LMA Insertion Date/Time: 01/27/2022 12:38 PM  Performed by: Babs Bertin, CRNAPre-anesthesia Checklist: Patient identified, Emergency Drugs available, Suction available and Patient being monitored Patient Re-evaluated:Patient Re-evaluated prior to induction Oxygen Delivery Method: Circle System Utilized Preoxygenation: Pre-oxygenation with 100% oxygen Induction Type: IV induction Ventilation: Mask ventilation without difficulty LMA: LMA inserted LMA Size: 4.0 Number of attempts: 1 Placement Confirmation: positive ETCO2 Tube secured with: Tape Dental Injury: Teeth and Oropharynx as per pre-operative assessment

## 2022-01-27 NOTE — Op Note (Signed)
    OPERATIVE NOTE   PROCEDURE: left second stage basilic vein transposition (brachiobasilic arteriovenous fistula) placement  PRE-OPERATIVE DIAGNOSIS: End stage renal disease  POST-OPERATIVE DIAGNOSIS: same  SURGEON: Marty Heck, MD  ASSISTANT(S): Vicente Serene, PA  ANESTHESIA:  LMA  ESTIMATED BLOOD LOSS: <75 mL  FINDING(S): Basilic vein was mobilized through three skip incisions in the left upper arm.  This appeared to be of good caliber.  #5 dilator passed without resistance.  It was transected near the antecubital fossa and transposed through a new skin tunnel and a new end-to-end primary anastomosis was performed.  Good thrill at completion.  SPECIMEN(S):  None  INDICATIONS:   Ronald Reeves is a 66 y.o. male who presents with end stage renal disease and need for permanent hemodialysis access.  The patient is scheduled for left second stage basilic vein transposition.  The patient is aware the risks include but are not limited to: bleeding, infection, steal syndrome, nerve damage, ischemic monomelic neuropathy, failure to mature, and need for additional procedures.  The patient is aware of the risks of the procedure and elects to proceed forward.   DESCRIPTION: After full informed written consent was obtained from the patient, the patient was brought back to the operating room and placed supine upon the operating table.  Prior to induction, the patient received IV antibiotics.   After obtaining adequate anesthesia, the patient was then prepped and draped in the standard fashion for a left arm access procedure.  I turned my attention first to identifying the patient's brachiobasilic arteriovenous fistula.  Using SonoSite guidance, the location of this fistula was marked out on the skin.    This was an excellent caliber vein.  I made three longitudinal incisions on the medial aspect of the left upper arm.  Through these incisions I dissected out circumferentially the basilic  vein, taking care to protect the nerve.  Once the vein was fully mobilized, all side branches were ligated between silk ties.  The vein was marked for orientation.  I then used a curved tunneler to create a subcutaneous tunnel.  The patient was given 3,000 units IV heparin.  The vein was then transected near the antecubital crease.  It was then brought to the previously created tunnel making sure to maintain proper orientation.  A primary anastomosis was then performed between the two cut ends of the vein with a running 6-0 Prolene.  Once this was done the clamps were released.  There was excellent flow through the fistula.  Hemostasis was then achieved.  The wound was irrigated.  The incision was closed with a deep layer of 3-0 Vicryl followed by a subcutaneous 4-0 Monocryl and Dermabond.  There were no immediate complications.  COMPLICATIONS: None  CONDITION: Stable  Marty Heck, MD [facility] Office: [place]   01/27/2022, 2:29 PM

## 2022-01-27 NOTE — Telephone Encounter (Signed)
Ronald Reeves with [facility] called stating that the prescribed Percocet was on back order.  Returned call. Informed Ronald Reeves that the pt was in the hospital and a msg was sent to the provider to either prescribe a different strength or send it to another pharmacy. Confirmed understanding.

## 2022-01-27 NOTE — Transfer of Care (Signed)
Immediate Anesthesia Transfer of Care Note  Patient: Ronald Reeves  Procedure(s) Performed: LEFT SECOND STAGE BASILIC VEIN TRANSPOSITION (Left)  Patient Location: PACU  Anesthesia Type:General  Level of Consciousness: responds to stimulation  Airway & Oxygen Therapy: Patient Spontanous Breathing  Post-op Assessment: Report given to RN and Post -op Vital signs reviewed and stable  Post vital signs: Reviewed and stable  Last Vitals:  Vitals Value Taken Time  BP    Temp    Pulse    Resp 14 01/27/22 1450  SpO2    Vitals shown include unvalidated device data.  Last Pain:  Vitals:   01/27/22 1007  TempSrc:   PainSc: 0-No pain         Complications: No notable events documented.

## 2022-01-28 ENCOUNTER — Telehealth: Payer: Self-pay

## 2022-01-28 ENCOUNTER — Other Ambulatory Visit: Payer: Self-pay | Admitting: Physician Assistant

## 2022-01-28 ENCOUNTER — Emergency Department (HOSPITAL_COMMUNITY)
Admission: EM | Admit: 2022-01-28 | Discharge: 2022-01-28 | Disposition: A | Discharge status: Home / Self Care | Payer: No Typology Code available for payment source | Attending: Emergency Medicine | Admitting: Emergency Medicine

## 2022-01-28 ENCOUNTER — Other Ambulatory Visit: Payer: Self-pay

## 2022-01-28 ENCOUNTER — Encounter (HOSPITAL_COMMUNITY): Payer: Self-pay | Admitting: Vascular Surgery

## 2022-01-28 DIAGNOSIS — Z5189 Encounter for other specified aftercare: Secondary | ICD-10-CM

## 2022-01-28 DIAGNOSIS — Z4801 Encounter for change or removal of surgical wound dressing: Secondary | ICD-10-CM | POA: Diagnosis present

## 2022-01-28 DIAGNOSIS — Z992 Dependence on renal dialysis: Secondary | ICD-10-CM | POA: Insufficient documentation

## 2022-01-28 DIAGNOSIS — Z7982 Long term (current) use of aspirin: Secondary | ICD-10-CM | POA: Diagnosis not present

## 2022-01-28 DIAGNOSIS — N186 End stage renal disease: Secondary | ICD-10-CM | POA: Diagnosis not present

## 2022-01-28 LAB — CBC WITH DIFFERENTIAL/PLATELET
Abs Immature Granulocytes: 0.02 10*3/uL (ref 0.00–0.07)
Basophils Absolute: 0 10*3/uL (ref 0.0–0.1)
Basophils Relative: 0 %
Eosinophils Absolute: 0.3 10*3/uL (ref 0.0–0.5)
Eosinophils Relative: 4 %
HCT: 34.8 % — ABNORMAL LOW (ref 39.0–52.0)
Hemoglobin: 11.7 g/dL — ABNORMAL LOW (ref 13.0–17.0)
Immature Granulocytes: 0 %
Lymphocytes Relative: 27 %
Lymphs Abs: 1.8 10*3/uL (ref 0.7–4.0)
MCH: 29.2 pg (ref 26.0–34.0)
MCHC: 33.6 g/dL (ref 30.0–36.0)
MCV: 86.8 fL (ref 80.0–100.0)
Monocytes Absolute: 0.7 10*3/uL (ref 0.1–1.0)
Monocytes Relative: 10 %
Neutro Abs: 3.9 10*3/uL (ref 1.7–7.7)
Neutrophils Relative %: 59 %
Platelets: 126 10*3/uL — ABNORMAL LOW (ref 150–400)
RBC: 4.01 MIL/uL — ABNORMAL LOW (ref 4.22–5.81)
RDW: 13.6 % (ref 11.5–15.5)
WBC: 6.7 10*3/uL (ref 4.0–10.5)
nRBC: 0 % (ref 0.0–0.2)

## 2022-01-28 LAB — BASIC METABOLIC PANEL
Anion gap: 10 (ref 5–15)
BUN: 26 mg/dL — ABNORMAL HIGH (ref 8–23)
CO2: 28 mmol/L (ref 22–32)
Calcium: 8.9 mg/dL (ref 8.9–10.3)
Chloride: 101 mmol/L (ref 98–111)
Creatinine, Ser: 4.71 mg/dL — ABNORMAL HIGH (ref 0.61–1.24)
GFR, Estimated: 13 mL/min — ABNORMAL LOW (ref >60)
Glucose, Bld: 83 mg/dL (ref 70–99)
Potassium: 3.5 mmol/L (ref 3.5–5.1)
Sodium: 139 mmol/L (ref 135–145)

## 2022-01-28 LAB — ABO/RH: ABO/RH(D): B POS

## 2022-01-28 LAB — PROTIME-INR
INR: 1.1 (ref 0.8–1.2)
Prothrombin Time: 13.6 seconds (ref 11.4–15.2)

## 2022-01-28 LAB — TYPE AND SCREEN
ABO/RH(D): B POS
Antibody Screen: NEGATIVE

## 2022-01-28 MED ORDER — OXYCODONE-ACETAMINOPHEN 10-325 MG PO TABS: 0.5000 | ORAL_TABLET | Freq: Four times a day (QID) | ORAL | 0 refills | Status: DC | PRN

## 2022-01-28 NOTE — Telephone Encounter (Signed)
Called pt to inform him that his Percocet prescription was resent to [facility] with a different dosage d/t being on back order and how to take. Confirmed with pharmacy that it was in stock and they are filling now. Pt to pu asap.

## 2022-01-28 NOTE — ED Triage Notes (Signed)
Pt reports having a fistula placed yesterday to his left arm and states that it is hurting and bleeding.

## 2022-01-28 NOTE — ED Provider Notes (Signed)
[facility] DEPT Provider Note   CSN: [id number] Arrival date & time: 01/28/22  2002     History  Chief Complaint  Patient presents with   Post-op Problem    Ronald Reeves is a 66 y.o. male.  Patient with a history of ESRD on dialysis here with draining from his surgical incision.  Yesterday he underwent left second stage basilic vein transposition (brachiobasilic arteriovenous fistula) placement by Dr. Carlis Abbott of vascular surgery.  He states earlier today there was some drainage coming from his upper arm going down his chest wall with increased pain.  He is not certain if this was blood or not.  His wife states the discharge was yellow and his entire shirt was soaked.  They did not see any bright red bleeding.  He states he is having increased pain in the arm and is concerned that he had some bleeding from the area.  Denies any blood thinner use other than aspirin.  No difficulty breathing or chest pain.  No weakness, numbness or tingling.  No dizziness or lightheadedness.  He is due for dialysis in the morning    The history is provided by the patient and a relative.       Home Medications Prior to Admission medications   Medication Sig Start Date End Date Taking? Authorizing Provider  amLODipine (NORVASC) 10 MG tablet Take 1 tablet (10 mg total) by mouth daily. 01/01/18   Lelon Perla, MD  aspirin EC 81 MG tablet Take 81 mg by mouth daily.    [provider]  diclofenac Sodium (VOLTAREN) 1 % GEL Apply 2 g topically 3 (three) times daily as needed (pain).    [provider]  furosemide (LASIX) 20 MG tablet Take 10 mg by mouth daily. 11/14/19   [provider]  hydrALAZINE (APRESOLINE) 25 MG tablet Take 2 tablets (50 mg total) by mouth every morning. Patient taking differently: Take 25 mg by mouth 2 (two) times daily. 01/01/18   Lelon Perla, MD  isosorbide mononitrate (IMDUR) 30 MG 24 hr tablet Take 1 tablet (30 mg total)  by mouth daily. 01/01/18   Lelon Perla, MD  labetalol (NORMODYNE) 100 MG tablet Take 2 tablets (200 mg total) by mouth every morning. Patient taking differently: Take 100 mg by mouth 2 (two) times daily. 01/01/18   Lelon Perla, MD  multivitamin (RENA-VIT) TABS tablet Take 1 tablet by mouth daily. 11/12/21   [provider]  Nutritional Supplements (ENSURE CLEAR) LIQD Take 1 Bottle by mouth in the morning and at bedtime.    [provider]  oxyCODONE-acetaminophen (PERCOCET) 10-325 MG tablet Take 0.5 tablets by mouth every 6 (six) hours as needed for pain. 01/28/22 01/28/23  Schuh, McKenzi P, PA-C  rosuvastatin (CRESTOR) 40 MG tablet Take 20 mg by mouth daily. 02/04/21   [provider]      Allergies    Patient has no known allergies.    Review of Systems   Review of Systems  Constitutional:  Negative for activity change, appetite change and fever.  HENT:  Negative for congestion.   Respiratory:  Negative for cough, chest tightness and shortness of breath.   Cardiovascular:  Negative for chest pain.  Gastrointestinal:  Negative for abdominal pain, nausea and vomiting.  Genitourinary:  Negative for dysuria, penile swelling and scrotal swelling.  Musculoskeletal:  Positive for arthralgias and myalgias.  Skin:  Positive for wound.  Neurological:  Negative for dizziness, weakness and headaches.  all other systems are negative except as noted in the HPI and PMH.    Physical Exam Updated Vital Signs BP (!) 147/65 (BP Location: Left Arm)   Pulse 70   Temp 98.6 F (37 C) (Oral)   Resp 14   Ht '5\' 10"'$  (1.778 m)   Wt 68 kg   SpO2 100%   BMI 21.52 kg/m  Physical Exam Vitals and nursing note reviewed.  Constitutional:      General: He is not in acute distress.    Appearance: He is well-developed.  HENT:     Head: Normocephalic and atraumatic.     Mouth/Throat:     Pharynx: No oropharyngeal exudate.  Eyes:     Conjunctiva/sclera: Conjunctivae normal.      Pupils: Pupils are equal, round, and reactive to light.  Neck:     Comments: No meningismus. Cardiovascular:     Rate and Rhythm: Normal rate and regular rhythm.     Heart sounds: Normal heart sounds. No murmur heard.    Comments: Dialysis catheter right chest appears clean Pulmonary:     Effort: Pulmonary effort is normal. No respiratory distress.     Breath sounds: Normal breath sounds.  Abdominal:     Palpations: Abdomen is soft.     Tenderness: There is no abdominal tenderness. There is no guarding or rebound.  Musculoskeletal:        General: Swelling and tenderness present.     Cervical back: Normal range of motion and neck supple.     Comments: Dressing taken down to left upper arm.  Surgical sites appear to be clean, dry and intact.  No active bleeding.  Intact thrill and bruit. Intact radial pulse and cardinal hand movements.  Skin:    General: Skin is warm.  Neurological:     Mental Status: He is alert and oriented to person, place, and time.     Cranial Nerves: No cranial nerve deficit.     Motor: No abnormal muscle tone.     Coordination: Coordination normal.     Comments:  5/5 strength throughout. CN 2-12 intact.Equal grip strength.   Psychiatric:        Behavior: Behavior normal.     ED Results / Procedures / Treatments   Labs (all labs ordered are listed, but only abnormal results are displayed) Labs Reviewed  CBC WITH DIFFERENTIAL/PLATELET - Abnormal; Notable for the following components:      Result Value   RBC 4.01 (*)    Hemoglobin 11.7 (*)    HCT 34.8 (*)    Platelets 126 (*)    All other components within normal limits  BASIC METABOLIC PANEL - Abnormal; Notable for the following components:   BUN 26 (*)    Creatinine, Ser 4.71 (*)    GFR, Estimated 13 (*)    All other components within normal limits  PROTIME-INR  TYPE AND SCREEN  ABO/RH    EKG None  Radiology No results found.  Procedures Procedures    Medications Ordered in  ED Medications - No data to display  ED Course/ Medical Decision Making/ A&P                           Medical Decision Making Amount and/or Complexity of Data Reviewed Labs: ordered. Decision-making details documented in ED Course. Radiology: ordered and independent interpretation performed. Decision-making details documented in ED Course. ECG/medicine tests: ordered and independent interpretation performed. Decision-making details documented in  ED Course.  Drainage from left upper arm today after vascular surgery yesterday.  Vitals are stable, no distress.  No active bleeding on arrival.  Patient did not see the drainage but this sounds more like it was clear serous drainage rather than bleeding. Surgical sites appear to be intact without bleeding.  There is a intact thrill and bruit. Arm is neurovascular intact distally.  D/w Dr. Scot Dock of vascular surgery.  He agrees patient stable for discharge if wound appears to be hemostatic and clean.  Recommends 4 x 4 dressing with loose Kerlix and Ace wrap and follow-up with Dr. Carlis Abbott.  Labs today show no need for emergent dialysis.  No hypoxia or increased work of breathing.  Potassium is normal  Wound is dressed.  Patient to follow-up with Dr. Carlis Abbott later this week.  He has dialysis scheduled tomorrow. Return precautions discussed.        Final Clinical Impression(s) / ED Diagnoses Final diagnoses:  Visit for wound check    Rx / DC Orders ED Discharge Orders     None         Auriella Wieand, Annie Main, MD 01/28/22 2339

## 2022-01-28 NOTE — Discharge Instructions (Signed)
Keep your surgical wound clean, and dry and follow-up with Dr. Carlis Abbott this week.  Do not wrap the wound too tightly.  Keep arm elevated.  Return to the ED with increased pain, drainage, bleeding, fever, weakness, numbness, tingling, other concerns.

## 2022-01-28 NOTE — Anesthesia Postprocedure Evaluation (Signed)
Anesthesia Post Note  Patient: Ronald Reeves  Procedure(s) Performed: LEFT SECOND STAGE BASILIC VEIN TRANSPOSITION (Left)     Patient location during evaluation: PACU Anesthesia Type: General Level of consciousness: awake and alert Pain management: pain level controlled Vital Signs Assessment: post-procedure vital signs reviewed and stable Respiratory status: spontaneous breathing, nonlabored ventilation, respiratory function stable and patient connected to nasal cannula oxygen Cardiovascular status: blood pressure returned to baseline, stable and bradycardic Postop Assessment: no apparent nausea or vomiting Anesthetic complications: no   No notable events documented.  Last Vitals:  Vitals:   01/27/22 1505 01/27/22 1520  BP: (!) 155/70 (!) 176/72  Pulse: (!) 55 (!) 55  Resp: 12 14  Temp:  36.4 C  SpO2: 96% 95%    Last Pain:  Vitals:   01/27/22 1520  TempSrc:   PainSc: 0-No pain                 Santa Lighter

## 2022-01-29 ENCOUNTER — Telehealth: Payer: Self-pay

## 2022-01-29 ENCOUNTER — Encounter: Payer: Self-pay | Admitting: Neurology

## 2022-01-29 ENCOUNTER — Other Ambulatory Visit: Payer: Self-pay

## 2022-01-29 DIAGNOSIS — R202 Paresthesia of skin: Secondary | ICD-10-CM

## 2022-01-29 NOTE — Telephone Encounter (Signed)
Pt called stating that he went to the ED yesterday d/t bleeding from his surgical wound and he was told to f/u with Dr Carlis Abbott.  Reviewed pt's chart, appt made for 1st available with Dr Carlis Abbott, returned pt's call, no answer, left vm with detailed msg.

## 2022-01-29 NOTE — Telephone Encounter (Signed)
Pt returned my vm to surgery line.  Called pt to inform him of appt scheduled. Pt stated that he has HD on T, Th, and S from 12-4 PM. He will ask them at HD tomorrow if he can come from 8-12 AM so he can make his appt. Pt to call back.

## 2022-02-01 ENCOUNTER — Emergency Department (HOSPITAL_COMMUNITY)
Admission: EM | Admit: 2022-02-01 | Discharge: 2022-02-02 | Disposition: A | Discharge status: Home / Self Care | Payer: No Typology Code available for payment source | Attending: Emergency Medicine | Admitting: Emergency Medicine

## 2022-02-01 ENCOUNTER — Encounter (HOSPITAL_COMMUNITY): Payer: Self-pay

## 2022-02-01 ENCOUNTER — Other Ambulatory Visit: Payer: Self-pay

## 2022-02-01 DIAGNOSIS — I120 Hypertensive chronic kidney disease with stage 5 chronic kidney disease or end stage renal disease: Secondary | ICD-10-CM | POA: Insufficient documentation

## 2022-02-01 DIAGNOSIS — Z7982 Long term (current) use of aspirin: Secondary | ICD-10-CM | POA: Insufficient documentation

## 2022-02-01 DIAGNOSIS — N186 End stage renal disease: Secondary | ICD-10-CM | POA: Diagnosis not present

## 2022-02-01 DIAGNOSIS — D631 Anemia in chronic kidney disease: Secondary | ICD-10-CM | POA: Insufficient documentation

## 2022-02-01 DIAGNOSIS — Z992 Dependence on renal dialysis: Secondary | ICD-10-CM | POA: Diagnosis not present

## 2022-02-01 DIAGNOSIS — Z79899 Other long term (current) drug therapy: Secondary | ICD-10-CM | POA: Diagnosis not present

## 2022-02-01 DIAGNOSIS — R200 Anesthesia of skin: Secondary | ICD-10-CM | POA: Diagnosis present

## 2022-02-01 DIAGNOSIS — N189 Chronic kidney disease, unspecified: Secondary | ICD-10-CM

## 2022-02-01 DIAGNOSIS — E876 Hypokalemia: Secondary | ICD-10-CM | POA: Diagnosis not present

## 2022-02-01 DIAGNOSIS — R202 Paresthesia of skin: Secondary | ICD-10-CM

## 2022-02-01 NOTE — ED Triage Notes (Signed)
Pt had fistula placed in left arm on the 19th of June. Yesterday at an unknown time pt began having numbness and pain that is spreading down left arm. Pain is 5/10. No numbness anywhere else.

## 2022-02-02 LAB — CBC WITH DIFFERENTIAL/PLATELET
Abs Immature Granulocytes: 0.01 10*3/uL (ref 0.00–0.07)
Basophils Absolute: 0 10*3/uL (ref 0.0–0.1)
Basophils Relative: 1 %
Eosinophils Absolute: 0.4 10*3/uL (ref 0.0–0.5)
Eosinophils Relative: 8 %
HCT: 34.8 % — ABNORMAL LOW (ref 39.0–52.0)
Hemoglobin: 11.3 g/dL — ABNORMAL LOW (ref 13.0–17.0)
Immature Granulocytes: 0 %
Lymphocytes Relative: 30 %
Lymphs Abs: 1.7 10*3/uL (ref 0.7–4.0)
MCH: 28.9 pg (ref 26.0–34.0)
MCHC: 32.5 g/dL (ref 30.0–36.0)
MCV: 89 fL (ref 80.0–100.0)
Monocytes Absolute: 0.7 10*3/uL (ref 0.1–1.0)
Monocytes Relative: 13 %
Neutro Abs: 2.8 10*3/uL (ref 1.7–7.7)
Neutrophils Relative %: 48 %
Platelets: 160 10*3/uL (ref 150–400)
RBC: 3.91 MIL/uL — ABNORMAL LOW (ref 4.22–5.81)
RDW: 13.9 % (ref 11.5–15.5)
WBC: 5.6 10*3/uL (ref 4.0–10.5)
nRBC: 0 % (ref 0.0–0.2)

## 2022-02-02 LAB — MAGNESIUM: Magnesium: 2.2 mg/dL (ref 1.7–2.4)

## 2022-02-02 LAB — BASIC METABOLIC PANEL
Anion gap: 8 (ref 5–15)
BUN: 13 mg/dL (ref 8–23)
CO2: 32 mmol/L (ref 22–32)
Calcium: 8.4 mg/dL — ABNORMAL LOW (ref 8.9–10.3)
Chloride: 99 mmol/L (ref 98–111)
Creatinine, Ser: 4.22 mg/dL — ABNORMAL HIGH (ref 0.61–1.24)
GFR, Estimated: 15 mL/min — ABNORMAL LOW (ref >60)
Glucose, Bld: 144 mg/dL — ABNORMAL HIGH (ref 70–99)
Potassium: 2.9 mmol/L — ABNORMAL LOW (ref 3.5–5.1)
Sodium: 139 mmol/L (ref 135–145)

## 2022-02-02 MED ORDER — POTASSIUM CHLORIDE CRYS ER 20 MEQ PO TBCR
40.0000 meq | EXTENDED_RELEASE_TABLET | Freq: Once | ORAL | Status: AC
  Administered 2022-02-02: 40 meq via ORAL
  Filled 2022-02-02: qty 2

## 2022-02-02 MED ORDER — OXYCODONE-ACETAMINOPHEN 5-325 MG PO TABS
1.0000 | ORAL_TABLET | Freq: Once | ORAL | Status: AC
  Administered 2022-02-02: 1 via ORAL
  Filled 2022-02-02: qty 1

## 2022-02-02 MED ORDER — POTASSIUM CHLORIDE CRYS ER 20 MEQ PO TBCR: 40.0000 meq | EXTENDED_RELEASE_TABLET | Freq: Two times a day (BID) | ORAL | 0 refills | Status: DC

## 2022-02-02 MED ORDER — OXYCODONE-ACETAMINOPHEN 10-325 MG PO TABS: 0.5000 | ORAL_TABLET | Freq: Four times a day (QID) | ORAL | 0 refills | Status: DC | PRN

## 2022-02-04 ENCOUNTER — Ambulatory Visit (INDEPENDENT_AMBULATORY_CARE_PROVIDER_SITE_OTHER): Payer: No Typology Code available for payment source | Admitting: Vascular Surgery

## 2022-02-04 ENCOUNTER — Encounter: Payer: Self-pay | Admitting: Vascular Surgery

## 2022-02-04 VITALS — BP 136/65 | HR 64 | Temp 98.8°F | Resp 16 | Ht 70.0 in | Wt 141.0 lb

## 2022-02-04 DIAGNOSIS — Z992 Dependence on renal dialysis: Secondary | ICD-10-CM

## 2022-02-04 DIAGNOSIS — N186 End stage renal disease: Secondary | ICD-10-CM

## 2022-04-03 ENCOUNTER — Ambulatory Visit (INDEPENDENT_AMBULATORY_CARE_PROVIDER_SITE_OTHER): Payer: No Typology Code available for payment source | Admitting: Neurology

## 2022-04-03 DIAGNOSIS — R202 Paresthesia of skin: Secondary | ICD-10-CM

## 2022-04-03 DIAGNOSIS — M5412 Radiculopathy, cervical region: Secondary | ICD-10-CM

## 2022-04-03 DIAGNOSIS — G5622 Lesion of ulnar nerve, left upper limb: Secondary | ICD-10-CM

## 2022-04-03 DIAGNOSIS — G5603 Carpal tunnel syndrome, bilateral upper limbs: Secondary | ICD-10-CM

## 2022-04-03 NOTE — Procedures (Signed)
[facility]  [place], [address]  [place], [place] [address] Tel: [phone] Fax:  [phone] Test Date:  04/03/2022  Patient: Ronald Reeves DOB: [birth year] Physician: Kai Levins, MD  Sex: Male Height: '5\' 10"'$  Ref Phys: Gerome Sam  ID#: [id number]   Technician:    Patient Complaints: This is a 66 year old male with numbness and tingling in his left upper extremity after fistula placement.  NCV & EMG Findings: Extensive electrodiagnostic evaluation of the left upper extremity with additional nerve conduction studies of the right upper extremity shows: The median sensory responses are absent bilaterally. The left ulnar sensory response is absent while the right ulnar sensory response is within normal limits. The left radial sensory response is reduced in terms of amplitude. The left median motor response shows a prolonged distal onset latency (5.5 ms), reduced amplitude (3.2 mV), and decreased conduction velocity (Elbow-Wrist, 41 m/s). The right median motor nerve shows prolonged distal onset latency (4.3 ms) but normal amplitude. The left ulnar motor nerve shows reduced amplitude (3.3 mV), decreased conduction velocity (B Elbow-Wrist, 44 m/s), and decreased conduction velocity (A Elbow-B Elbow, 40 m/s). The left ulnar F Wave response is absent. Needle examination of the left upper extremity shows chronic motor axon loss changes in all muscles located below the elbow (first dorsal interosseous, extensor indicis, abductor pollicis brevis, flexor pollicis longus, pronator teres, and flexor digitorum profundus to digits 4,5) without accompanying active denervation changes.  Impression: This is a complex electrodiagnostic evaluation of the left upper extremity with comparison nerve conduction studies of the right upper extremity. 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  The asymmetric ulnar sensory and motor responses with chronic motor axon loss changes in left ulnar innervated muscles could be the result of a left ulnar mononeuropathy localized proximal to the take off for the left flexor digitorum profundus 4,5, axon loss in type, severe in degree electrically (versus #1 above). The chronic motor axon loss changes in proximal left median innervated muscles could be the result of a proximal left median mononeuropathy, axon loss in type, at least moderate in degree electrically (versus #1 above). Superimposed residuals of an old intraspinal canal lesion (ie: motor radiculopathy) at the left C6 and C8 roots or segments, moderate in degree electrically, cannot be completely excluded (versus #1 above). Bilateral median mononeuropathy, at or distal to the wrist, consistent with the diagnosis of carpal tunnel syndrome, severe in degree electrically bilaterally.   ___________________________ Kai Levins, MD    Nerve Conduction Studies Anti Sensory Summary Table   Stim Site NR Peak (ms) Norm Peak (ms) P-T Amp (V) Norm O-P Amp  Left Median Anti Sensory (2nd Digit)  35C  Wrist NR  <3.8  >10  Right Median Anti Sensory (2nd Digit)  35C  Wrist NR  <3.8  >10  Left Radial Anti Sensory (Base 1st Digit)  35C  Wrist    2.8 <2.8 2.9 >10  Left Ulnar Anti Sensory (5th Digit)  35C  Wrist NR  <3.2  >5  Right Ulnar Anti Sensory (5th Digit)  35C  Wrist    3.2 <3.2 6.1 >5   Motor Summary Table   Stim Site NR Onset (ms) Norm Onset (ms) O-P Amp (mV) Norm O-P  Amp Site1 Site2 Delta-0 (ms) Dist (cm) Vel (m/s) Norm Vel (m/s)  Left Median Motor (Abd Poll Brev)  35C  Wrist    5.5 <4.0 3.2 >5 Elbow Wrist 7.3 30.0 41 >50  Elbow    12.8  3.1         Right Median Motor (Abd Poll Brev)  35C  Wrist    4.3 <4.0 5.0 >5 Elbow Wrist 5.9 30.0 51 >50  Elbow    10.2  4.7         Left Ulnar Motor (Abd Dig Minimi)  35C  Wrist    3.1 <3.1 3.3 >7 B Elbow Wrist 5.7 25.0 44 >50  B Elbow    8.8  3.1  A Elbow B Elbow 2.5 10.0 40 >50  A Elbow    11.3  3.0         Right Ulnar Motor (Abd Dig Minimi)  35C  Wrist    3.0 <3.1 8.1 >7 B Elbow Wrist 4.9 24.0 49 >50  B Elbow    7.9  7.2  A Elbow B Elbow 2.3 10.0 43 >50  A Elbow    10.2  7.3          F Wave Studies   NR F-Lat (ms) Lat Norm (ms) L-R F-Lat (ms)  Left Ulnar (Mrkrs) (Abd Dig Min)  35C  NR  <33    EMG   Side Muscle Ins Act Fibs Fasc Recrt Dur. Amp. Poly. Activation Comment  Left 1stDorInt Nml 1+ 1+ 2- 1+ 1+ 1+ Nml N/A  Left Ext Indicis Nml Nml Nml 2- 1+ 1+ Nml Nml N/A  Left Abd Poll Brev Nml Nml Nml 2- 1+ 1+ 1+ Nml N/A  Left PronatorTeres Nml Nml Nml 1- 1+ 1+ Nml Nml N/A  Left Biceps Nml Nml Nml Nml Nml Nml Nml Nml N/A  Left Triceps Nml Nml Nml Nml Nml Nml Nml Nml N/A  Left Deltoid Nml Nml Nml Nml Nml Nml Nml Nml N/A  Left FlexDigProf 4,5 Nml Nml Nml 3- 1+ 1+ 1+ Nml N/A  Left FlexPolLong Nml Nml Nml 2- 1+ 1+ 1+ Nml N/A      Waveforms:

## 2022-04-18 ENCOUNTER — Telehealth: Payer: Self-pay | Admitting: Neurology

## 2022-04-18 NOTE — Telephone Encounter (Signed)
Pt called in and left a message with the access nurse. He would like to see if he can get his EMG results

## 2022-04-18 NOTE — Telephone Encounter (Signed)
Called patient and left a message that he will need to call his referring provider to receive his EMG results.

## 2022-04-28 ENCOUNTER — Encounter: Payer: No Typology Code available for payment source | Admitting: Neurology

## 2022-04-30 ENCOUNTER — Encounter (HOSPITAL_COMMUNITY): Payer: Self-pay | Admitting: *Deleted

## 2022-04-30 ENCOUNTER — Emergency Department (HOSPITAL_COMMUNITY)
Admission: RE | Admit: 2022-04-30 | Discharge: 2022-04-30 | Disposition: A | Discharge status: Home / Self Care | Payer: No Typology Code available for payment source | Attending: Physician Assistant | Admitting: Physician Assistant

## 2022-04-30 ENCOUNTER — Encounter (HOSPITAL_COMMUNITY): Payer: Self-pay | Admitting: Anesthesiology

## 2022-04-30 ENCOUNTER — Other Ambulatory Visit: Payer: Self-pay

## 2022-04-30 ENCOUNTER — Emergency Department (HOSPITAL_COMMUNITY): Payer: No Typology Code available for payment source

## 2022-04-30 DIAGNOSIS — N185 Chronic kidney disease, stage 5: Secondary | ICD-10-CM | POA: Diagnosis not present

## 2022-04-30 DIAGNOSIS — Z5321 Procedure and treatment not carried out due to patient leaving prior to being seen by health care provider: Secondary | ICD-10-CM | POA: Diagnosis not present

## 2022-04-30 DIAGNOSIS — R2232 Localized swelling, mass and lump, left upper limb: Secondary | ICD-10-CM | POA: Diagnosis not present

## 2022-04-30 DIAGNOSIS — T82868A Thrombosis of vascular prosthetic devices, implants and grafts, initial encounter: Secondary | ICD-10-CM | POA: Diagnosis not present

## 2022-04-30 DIAGNOSIS — T82898A Other specified complication of vascular prosthetic devices, implants and grafts, initial encounter: Secondary | ICD-10-CM | POA: Diagnosis not present

## 2022-04-30 LAB — COMPREHENSIVE METABOLIC PANEL
ALT: 24 U/L (ref 0–44)
AST: 26 U/L (ref 15–41)
Albumin: 3.4 g/dL — ABNORMAL LOW (ref 3.5–5.0)
Alkaline Phosphatase: 68 U/L (ref 38–126)
Anion gap: 11 (ref 5–15)
BUN: 46 mg/dL — ABNORMAL HIGH (ref 8–23)
CO2: 24 mmol/L (ref 22–32)
Calcium: 8.7 mg/dL — ABNORMAL LOW (ref 8.9–10.3)
Chloride: 106 mmol/L (ref 98–111)
Creatinine, Ser: 5.8 mg/dL — ABNORMAL HIGH (ref 0.61–1.24)
GFR, Estimated: 10 mL/min — ABNORMAL LOW (ref >60)
Glucose, Bld: 107 mg/dL — ABNORMAL HIGH (ref 70–99)
Potassium: 4.5 mmol/L (ref 3.5–5.1)
Sodium: 141 mmol/L (ref 135–145)
Total Bilirubin: 0.5 mg/dL (ref 0.3–1.2)
Total Protein: 6.5 g/dL (ref 6.5–8.1)

## 2022-04-30 LAB — CBC WITH DIFFERENTIAL/PLATELET
Abs Immature Granulocytes: 0.01 10*3/uL (ref 0.00–0.07)
Basophils Absolute: 0 10*3/uL (ref 0.0–0.1)
Basophils Relative: 0 %
Eosinophils Absolute: 0.1 10*3/uL (ref 0.0–0.5)
Eosinophils Relative: 1 %
HCT: 26 % — ABNORMAL LOW (ref 39.0–52.0)
Hemoglobin: 8.3 g/dL — ABNORMAL LOW (ref 13.0–17.0)
Immature Granulocytes: 0 %
Lymphocytes Relative: 23 %
Lymphs Abs: 1.6 10*3/uL (ref 0.7–4.0)
MCH: 29.5 pg (ref 26.0–34.0)
MCHC: 31.9 g/dL (ref 30.0–36.0)
MCV: 92.5 fL (ref 80.0–100.0)
Monocytes Absolute: 0.6 10*3/uL (ref 0.1–1.0)
Monocytes Relative: 8 %
Neutro Abs: 4.7 10*3/uL (ref 1.7–7.7)
Neutrophils Relative %: 68 %
Platelets: 130 10*3/uL — ABNORMAL LOW (ref 150–400)
RBC: 2.81 MIL/uL — ABNORMAL LOW (ref 4.22–5.81)
RDW: 14.9 % (ref 11.5–15.5)
WBC: 7 10*3/uL (ref 4.0–10.5)
nRBC: 0 % (ref 0.0–0.2)

## 2022-04-30 LAB — LACTIC ACID, PLASMA: Lactic Acid, Venous: 0.8 mmol/L (ref 0.5–1.9)

## 2022-04-30 LAB — TYPE AND SCREEN
ABO/RH(D): B POS
Antibody Screen: NEGATIVE

## 2022-04-30 MED ORDER — ORAL CARE MOUTH RINSE: 15.0000 mL | Freq: Once | OROMUCOSAL | Status: AC

## 2022-04-30 MED ORDER — ACETAMINOPHEN 500 MG PO TABS
ORAL_TABLET | ORAL | Status: AC
  Administered 2022-04-30: 1000 mg via ORAL
  Filled 2022-04-30: qty 1

## 2022-04-30 MED ORDER — SODIUM CHLORIDE 0.9 % IV SOLN: INTRAVENOUS | Status: DC

## 2022-04-30 MED ORDER — ACETAMINOPHEN 500 MG PO TABS
1000.0000 mg | ORAL_TABLET | Freq: Once | ORAL | Status: AC
  Filled 2022-04-30: qty 2

## 2022-04-30 MED ORDER — CHLORHEXIDINE GLUCONATE 0.12 % MT SOLN
15.0000 mL | Freq: Once | OROMUCOSAL | Status: AC
  Administered 2022-04-30: 15 mL via OROMUCOSAL
  Filled 2022-04-30: qty 15

## 2022-04-30 NOTE — Progress Notes (Signed)
Patient was discharge per Dr. [name] verbal order. Patient was instructed to not eat or drink anything after midnight if the surgery will be scheduled for tomorrow; patient received the CHG soap to take shower the night before surgery and the morning of surgery. Patient verbalized understanding.  Patient was accompanied to the main entrance.

## 2022-04-30 NOTE — Anesthesia Preprocedure Evaluation (Deleted)
Anesthesia Evaluation    Reviewed: Allergy & Precautions, Patient's Chart, lab work & pertinent test results, reviewed documented beta blocker date and time   Airway        Dental   Pulmonary Current Smoker and Patient abstained from smoking.,           Cardiovascular hypertension, Pt. on medications and Pt. on home beta blockers + CAD, + Past MI and +CHF (LVEF 45-50%)  + Valvular Problems/Murmurs (mild AI) AI   Echo 2019 - Left ventricle: The cavity size was normal. There was moderate  concentric hypertrophy. Systolic function was mildly reduced. The  estimated ejection fraction was in the range of 45% to 50%. There  is hypokinesis of the basal-midinferolateral and inferior  myocardium. The study is not technically sufficient to allow  evaluation of LV diastolic function.  - Aortic valve: There was mild regurgitation.  - Mitral valve: There was trivial regurgitation.  - Left atrium: The atrium was mildly dilated.  - Pulmonic valve: There was trivial regurgitation.    Neuro/Psych PSYCHIATRIC DISORDERS Depression negative neurological ROS     GI/Hepatic GERD  Controlled,(+)       alcohol use,   Endo/Other  diabetes (pre-diabetic)  Renal/GU ESRF and DialysisRenal diseaseClotted fistula K 4.5 today  negative genitourinary   Musculoskeletal  (+) Arthritis , Osteoarthritis,    Abdominal   Peds  Hematology  (+) Blood dyscrasia, anemia , Hb 8.3, plt 130   Anesthesia Other Findings   Reproductive/Obstetrics negative OB ROS                             Anesthesia Physical Anesthesia Plan  ASA: 3  Anesthesia Plan: General   Post-op Pain Management: Tylenol PO (pre-op)*   Induction: Intravenous  PONV Risk Score and Plan: 1 and Ondansetron, Dexamethasone and Treatment may vary due to age or medical condition  Airway Management Planned: Oral ETT  Additional Equipment:  None  Intra-op Plan:   Post-operative Plan: Extubation in OR  Informed Consent:   Plan Discussed with:   Anesthesia Plan Comments:         Anesthesia Quick Evaluation

## 2022-04-30 NOTE — Progress Notes (Signed)
Patient arrived on short stay, alert and oriented, able to verbalized his needs. Patient verbalized that at 12:00 o'clock patient ate a pack of skittles and he drank a soda. Patient verbalized that he thought he finished and he can eat and drink. Dr. [name] and Dr. Doroteo Glassman were notified.

## 2022-04-30 NOTE — H&P (View-Only) (Signed)
VASCULAR AND VEIN SPECIALISTS OF [place]  ASSESSMENT / PLAN: 66 y.o. male with tense hematoma in the left arm after traumatic cannulation of left basilic vein fistula in dialysis clinic Monday.  He has end-stage renal disease, dialyzing Monday, Wednesday, and Friday.  I think his fistula will be lost if we allow conservative management.  I will plan to take him to the operating room for washout and placement of a tunneled dialysis catheter today.  CHIEF COMPLAINT: Left arm painful and tense after dialysis  HISTORY OF PRESENT ILLNESS: Ronald Reeves is a 66 y.o. male referred to ER for evaluation of tense left upper arm after traumatic cannulation at dialysis.  The patient has end-stage renal disease on dialysis Monday, Wednesday, and Friday via left arm second stage basilic vein fistula transposed on 01/27/2022.  Dialysis has been going well for him until this past Monday.  This arm is gotten steadily worse over the week.  He presented to dialysis clinic today and was referred urgently to the ER for evaluation. Past Medical History:  Diagnosis Date   Anemia    Arthritis    Back pain    Bronchitis    Chronic kidney disease    Coronary artery disease    GERD (gastroesophageal reflux disease)    not a current problem   Heart murmur    Hypertension    Myocardial infarction ([place])    Pre-diabetes    does not have CBG machine.   Substance abuse [facility])     Past Surgical History:  Procedure Laterality Date   AV FISTULA PLACEMENT Left 04/12/2021   Procedure: LEFT ARM ARTERIOVENOUS (AV) FISTULA CREATION;  Surgeon: Marty Heck, MD;  Location: [place];  Service: Vascular;  Laterality: Left;   AV FISTULA PLACEMENT Left 10/18/2021   Procedure: LEFT ARM BRACHIOBASILIC FISTULA CREATION FIRST STAGE;  Surgeon: Marty Heck, MD;  Location: [place];  Service: Vascular;  Laterality: Left;   [place] Left 01/27/2022   Procedure: LEFT SECOND STAGE BASILIC VEIN  TRANSPOSITION;  Surgeon: Marty Heck, MD;  Location: [place];  Service: Vascular;  Laterality: Left;   LIGATION OF COMPETING BRANCHES OF ARTERIOVENOUS FISTULA Left 07/15/2021   Procedure: LIGATION OF COMPETING BRANCHES OF LEFT RADIOCEPHALIC ARTERIOVENOUS FISTULA TIMES TWO;  Surgeon: Marty Heck, MD;  [facility];  Service: Vascular;  Laterality: Left;   REVISON OF ARTERIOVENOUS FISTULA Left 07/15/2021   Procedure: REVISON OF LEFT ARTERIOVENOUS FISTULA;  Surgeon: Marty Heck, MD;  Location: [facility] OR;  Service: Vascular;  Laterality: Left;    Family History  Problem Relation Age of Onset   Heart disease Mother    Heart attack Sister 23    Social History   Socioeconomic History   Marital status: Married    Spouse name: Not on file   Number of children: Not on file   Years of education: Not on file   Highest education level: Not on file  Occupational History   Not on file  Tobacco Use   Smoking status: Every Day    Types: Cigars   Smokeless tobacco: Current   Tobacco comments:    3 black and mild a day  Vaping Use   Vaping Use: Never used  Substance and Sexual Activity   Alcohol use: Yes    Alcohol/week: 14.0 standard drinks of alcohol    Types: 14 Cans of beer per week    Comment: Daily   Drug use: Not  Currently    Frequency: 1.0 times per week    Types: Cocaine, Marijuana    Comment: marijuana daily   Sexual activity: Yes  Other Topics Concern   Not on file  Social History Narrative   Not on file   Social Determinants of Health   Financial Resource Strain: Not on file  Food Insecurity: Not on file  Transportation Needs: Not on file  Physical Activity: Not on file  Stress: Not on file  Social Connections: Not on file  Intimate Partner Violence: Not on file    No Known Allergies  No current facility-administered medications for this encounter.   Current Outpatient Medications  Medication Sig Dispense Refill   amLODipine (NORVASC) 10  MG tablet Take 1 tablet (10 mg total) by mouth daily. 90 tablet 3   aspirin EC 81 MG tablet Take 81 mg by mouth daily.     diclofenac Sodium (VOLTAREN) 1 % GEL Apply 2 g topically 3 (three) times daily as needed (pain).     furosemide (LASIX) 20 MG tablet Take 20 mg by mouth daily.     hydrALAZINE (APRESOLINE) 25 MG tablet Take 2 tablets (50 mg total) by mouth every morning. (Patient taking differently: Take 25 mg by mouth 2 (two) times daily.) 180 tablet 3   isosorbide mononitrate (IMDUR) 30 MG 24 hr tablet Take 1 tablet (30 mg total) by mouth daily. 90 tablet 3   labetalol (NORMODYNE) 100 MG tablet Take 2 tablets (200 mg total) by mouth every morning. (Patient taking differently: Take 100 mg by mouth 2 (two) times daily.) 180 tablet 3   multivitamin (RENA-VIT) TABS tablet Take 1 tablet by mouth daily.     Nutritional Supplements (ENSURE CLEAR) LIQD Take 1 Bottle by mouth in the morning and at bedtime.     oxyCODONE-acetaminophen (PERCOCET) 10-325 MG tablet Take 0.5 tablets by mouth every 6 (six) hours as needed for pain. 10 tablet 0   potassium chloride SA (KLOR-CON M) 20 MEQ tablet Take 2 tablets (40 mEq total) by mouth 2 (two) times daily. 4 tablet 0   rosuvastatin (CRESTOR) 40 MG tablet Take 40 mg by mouth daily.      PHYSICAL EXAM Vitals:   04/30/22 1049  BP: (!) 162/84  Pulse: 78  Resp: 18  Temp: 98 F (36.7 C)  TempSrc: Oral  SpO2: 99%   Chronically ill appearing gentleman in no acute distress Regular rate and rhythm Unlabored breathing Left upper extremity with tense hematoma and left arm.  There is a pulsatile thrill in the fistula in the upper extremity.  He has an easily palpable radial pulse.  PERTINENT LABORATORY AND RADIOLOGIC DATA  Most recent CBC    Latest Ref Rng & Units 04/30/2022   10:51 AM 02/02/2022    4:07 AM 01/28/2022    8:37 PM  CBC  WBC 4.0 - 10.5 K/uL 7.0  5.6  6.7   Hemoglobin 13.0 - 17.0 g/dL 8.3  11.3  11.7   Hematocrit 39.0 - 52.0 % 26.0  34.8   34.8   Platelets 150 - 400 K/uL 130  160  126      Most recent CMP    Latest Ref Rng & Units 04/30/2022   10:51 AM 02/02/2022    4:07 AM 01/28/2022    8:37 PM  CMP  Glucose 70 - 99 mg/dL 107  144  83   BUN 8 - 23 mg/dL 46  13  26   Creatinine 0.61 - 1.24 mg/dL  5.80  4.22  4.71   Sodium 135 - 145 mmol/L 141  139  139   Potassium 3.5 - 5.1 mmol/L 4.5  2.9  3.5   Chloride 98 - 111 mmol/L 106  99  101   CO2 22 - 32 mmol/L 24  32  28   Calcium 8.9 - 10.3 mg/dL 8.7  8.4  8.9   Total Protein 6.5 - 8.1 g/dL 6.5     Total Bilirubin 0.3 - 1.2 mg/dL 0.5     Alkaline Phos 38 - 126 U/L 68     AST 15 - 41 U/L 26     ALT 0 - 44 U/L 24       Renal function CrCl cannot be calculated (Unknown ideal weight.).  No results found for: "HGBA1C"  LDL Cholesterol  Date Value Ref Range Status  08/21/2017 86 0 - 99 mg/dL Final    Comment:           Total Cholesterol/HDL:CHD Risk Coronary Heart Disease Risk Table                     Men   Women  1/2 Average Risk   3.4   3.3  Average Risk       5.0   4.4  2 X Average Risk   9.6   7.1  3 X Average Risk  23.4   11.0        Use the calculated Patient Ratio above and the CHD Risk Table to determine the patient's CHD Risk.        ATP III CLASSIFICATION (LDL):  <100     mg/dL   Optimal  100-129  mg/dL   Near or Above                    Optimal  130-159  mg/dL   Borderline  160-189  mg/dL   High  >190     mg/dL   Very High      Ronald Reeves N. [name], MD Vascular and Vein Specialists of [facility] Phone Number: [phone] 04/30/2022 1:15 PM  Total time spent on preparing this encounter including chart review, data review, collecting history, examining the patient, coordinating care for this established patient, 40 minutes.  Portions of this report may have been transcribed using voice recognition software.  Every effort has been made to ensure accuracy; however, inadvertent computerized transcription errors may still be present.

## 2022-04-30 NOTE — Progress Notes (Signed)
Patient was called tonight and informed that he must be at the hospital tomorrow morning, at 07:30 o'clock. Patient was instructed to not eat or drink after midnight , but to take in the morning with a small sip of water Amlodipine, Hydralazine, Imdur, Labetalol, Crestor, Flomax and inhaler as needed.  Patient was instructed to take shower tonight and tomorrow morning with the soap he received today in short stay. Patient verbalized understanding.

## 2022-04-30 NOTE — Consult Note (Signed)
VASCULAR AND VEIN SPECIALISTS OF [place]  ASSESSMENT / PLAN: 67 y.o. male with tense hematoma in the left arm after traumatic cannulation of left basilic vein fistula in dialysis clinic Monday.  He has end-stage renal disease, dialyzing Monday, Wednesday, and Friday.  I think his fistula will be lost if we allow conservative management.  I will plan to take him to the operating room for washout and placement of a tunneled dialysis catheter today.  CHIEF COMPLAINT: Left arm painful and tense after dialysis  HISTORY OF PRESENT ILLNESS: Ronald Reeves is a 66 y.o. male referred to ER for evaluation of tense left upper arm after traumatic cannulation at dialysis.  The patient has end-stage renal disease on dialysis Monday, Wednesday, and Friday via left arm second stage basilic vein fistula transposed on 01/27/2022.  Dialysis has been going well for him until this past Monday.  This arm is gotten steadily worse over the week.  He presented to dialysis clinic today and was referred urgently to the ER for evaluation. Past Medical History:  Diagnosis Date   Anemia    Arthritis    Back pain    Bronchitis    Chronic kidney disease    Coronary artery disease    GERD (gastroesophageal reflux disease)    not a current problem   Heart murmur    Hypertension    Myocardial infarction ([place])    Pre-diabetes    does not have CBG machine.   Substance abuse [facility])     Past Surgical History:  Procedure Laterality Date   AV FISTULA PLACEMENT Left 04/12/2021   Procedure: LEFT ARM ARTERIOVENOUS (AV) FISTULA CREATION;  Surgeon: Marty Heck, MD;  Location: [place];  Service: Vascular;  Laterality: Left;   AV FISTULA PLACEMENT Left 10/18/2021   Procedure: LEFT ARM BRACHIOBASILIC FISTULA CREATION FIRST STAGE;  Surgeon: Marty Heck, MD;  Location: [place];  Service: Vascular;  Laterality: Left;   [place] Left 01/27/2022   Procedure: LEFT SECOND STAGE BASILIC VEIN  TRANSPOSITION;  Surgeon: Marty Heck, MD;  Location: [place];  Service: Vascular;  Laterality: Left;   LIGATION OF COMPETING BRANCHES OF ARTERIOVENOUS FISTULA Left 07/15/2021   Procedure: LIGATION OF COMPETING BRANCHES OF LEFT RADIOCEPHALIC ARTERIOVENOUS FISTULA TIMES TWO;  Surgeon: Marty Heck, MD;  [facility] OR;  Service: Vascular;  Laterality: Left;   REVISON OF ARTERIOVENOUS FISTULA Left 07/15/2021   Procedure: REVISON OF LEFT ARTERIOVENOUS FISTULA;  Surgeon: Marty Heck, MD;  Location: [facility] OR;  Service: Vascular;  Laterality: Left;    Family History  Problem Relation Age of Onset   Heart disease Mother    Heart attack Sister 81    Social History   Socioeconomic History   Marital status: Married    Spouse name: Not on file   Number of children: Not on file   Years of education: Not on file   Highest education level: Not on file  Occupational History   Not on file  Tobacco Use   Smoking status: Every Day    Types: Cigars   Smokeless tobacco: Current   Tobacco comments:    3 black and mild a day  Vaping Use   Vaping Use: Never used  Substance and Sexual Activity   Alcohol use: Yes    Alcohol/week: 14.0 standard drinks of alcohol    Types: 14 Cans of beer per week    Comment: Daily   Drug use: Not  Currently    Frequency: 1.0 times per week    Types: Cocaine, Marijuana    Comment: marijuana daily   Sexual activity: Yes  Other Topics Concern   Not on file  Social History Narrative   Not on file   Social Determinants of Health   Financial Resource Strain: Not on file  Food Insecurity: Not on file  Transportation Needs: Not on file  Physical Activity: Not on file  Stress: Not on file  Social Connections: Not on file  Intimate Partner Violence: Not on file    No Known Allergies  No current facility-administered medications for this encounter.   Current Outpatient Medications  Medication Sig Dispense Refill   amLODipine (NORVASC) 10  MG tablet Take 1 tablet (10 mg total) by mouth daily. 90 tablet 3   aspirin EC 81 MG tablet Take 81 mg by mouth daily.     diclofenac Sodium (VOLTAREN) 1 % GEL Apply 2 g topically 3 (three) times daily as needed (pain).     furosemide (LASIX) 20 MG tablet Take 20 mg by mouth daily.     hydrALAZINE (APRESOLINE) 25 MG tablet Take 2 tablets (50 mg total) by mouth every morning. (Patient taking differently: Take 25 mg by mouth 2 (two) times daily.) 180 tablet 3   isosorbide mononitrate (IMDUR) 30 MG 24 hr tablet Take 1 tablet (30 mg total) by mouth daily. 90 tablet 3   labetalol (NORMODYNE) 100 MG tablet Take 2 tablets (200 mg total) by mouth every morning. (Patient taking differently: Take 100 mg by mouth 2 (two) times daily.) 180 tablet 3   multivitamin (RENA-VIT) TABS tablet Take 1 tablet by mouth daily.     Nutritional Supplements (ENSURE CLEAR) LIQD Take 1 Bottle by mouth in the morning and at bedtime.     oxyCODONE-acetaminophen (PERCOCET) 10-325 MG tablet Take 0.5 tablets by mouth every 6 (six) hours as needed for pain. 10 tablet 0   potassium chloride SA (KLOR-CON M) 20 MEQ tablet Take 2 tablets (40 mEq total) by mouth 2 (two) times daily. 4 tablet 0   rosuvastatin (CRESTOR) 40 MG tablet Take 40 mg by mouth daily.      PHYSICAL EXAM Vitals:   04/30/22 1049  BP: (!) 162/84  Pulse: 78  Resp: 18  Temp: 98 F (36.7 C)  TempSrc: Oral  SpO2: 99%   Chronically ill appearing gentleman in no acute distress Regular rate and rhythm Unlabored breathing Left upper extremity with tense hematoma and left arm.  There is a pulsatile thrill in the fistula in the upper extremity.  He has an easily palpable radial pulse.  PERTINENT LABORATORY AND RADIOLOGIC DATA  Most recent CBC    Latest Ref Rng & Units 04/30/2022   10:51 AM 02/02/2022    4:07 AM 01/28/2022    8:37 PM  CBC  WBC 4.0 - 10.5 K/uL 7.0  5.6  6.7   Hemoglobin 13.0 - 17.0 g/dL 8.3  11.3  11.7   Hematocrit 39.0 - 52.0 % 26.0  34.8   34.8   Platelets 150 - 400 K/uL 130  160  126      Most recent CMP    Latest Ref Rng & Units 04/30/2022   10:51 AM 02/02/2022    4:07 AM 01/28/2022    8:37 PM  CMP  Glucose 70 - 99 mg/dL 107  144  83   BUN 8 - 23 mg/dL 46  13  26   Creatinine 0.61 - 1.24 mg/dL  5.80  4.22  4.71   Sodium 135 - 145 mmol/L 141  139  139   Potassium 3.5 - 5.1 mmol/L 4.5  2.9  3.5   Chloride 98 - 111 mmol/L 106  99  101   CO2 22 - 32 mmol/L 24  32  28   Calcium 8.9 - 10.3 mg/dL 8.7  8.4  8.9   Total Protein 6.5 - 8.1 g/dL 6.5     Total Bilirubin 0.3 - 1.2 mg/dL 0.5     Alkaline Phos 38 - 126 U/L 68     AST 15 - 41 U/L 26     ALT 0 - 44 U/L 24       Renal function CrCl cannot be calculated (Unknown ideal weight.).  No results found for: "HGBA1C"  LDL Cholesterol  Date Value Ref Range Status  08/21/2017 86 0 - 99 mg/dL Final    Comment:           Total Cholesterol/HDL:CHD Risk Coronary Heart Disease Risk Table                     Men   Women  1/2 Average Risk   3.4   3.3  Average Risk       5.0   4.4  2 X Average Risk   9.6   7.1  3 X Average Risk  23.4   11.0        Use the calculated Patient Ratio above and the CHD Risk Table to determine the patient's CHD Risk.        ATP III CLASSIFICATION (LDL):  <100     mg/dL   Optimal  100-129  mg/dL   Near or Above                    Optimal  130-159  mg/dL   Borderline  160-189  mg/dL   High  >190     mg/dL   Very High      Onesimo Lingard N. [name], MD [facility]ascular and Vein Specialists of [facility] Phone Number: [phone] 04/30/2022 1:15 PM  Total time spent on preparing this encounter including chart review, data review, collecting history, examining the patient, coordinating care for this established patient, 40 minutes.  Portions of this report may have been transcribed using voice recognition software.  Every effort has been made to ensure accuracy; however, inadvertent computerized transcription errors may still be present.

## 2022-04-30 NOTE — ED Provider Triage Note (Signed)
Emergency Medicine Provider Triage Evaluation Note  Ronald Reeves , a 66 y.o. male  was evaluated in triage.  Pt complains of left upper arm swelling for the past 2 days.  He is a dialysis patient Monday Wednesday Friday last dialyzed on Monday.  Experiencing some shortness of breath as well.  Febrile at home with a temperature 101.7.  Evaluated by vascular Dr. Luan Reeves while in triage.  Review of Systems  Positive: Fever, arm swelling,sob Negative: Chest pain  Physical Exam  There were no vitals taken for this visit. Gen:   Awake, no distress   Resp:  Normal effort  MSK:   Moves extremities without difficulty  Other:  Thickened swelling to the left upper arm, with some changes in the skin along with ecchymosis beginning to form. Pulses present. Good capillary refill  Medical Decision Making  Medically screening exam initiated at 10:45 AM.  Appropriate orders placed.  Ronald Reeves was informed that the remainder of the evaluation will be completed by another provider, this initial triage assessment does not replace that evaluation, and the importance of remaining in the ED until their evaluation is complete.    Janeece Fitting, PA-C 04/30/22 1048

## 2022-04-30 NOTE — Progress Notes (Signed)
Patient ate [name] in the waiting room of the ER. I have been informed his case cannot proceed until 6PM. I offered [name] surgery tomorrow or waiting until 6PM. He prefers to go home and return to preop area tomorrow for surgery. Will write discharge orders, and have him return in the morning.  Ronald Reeves. [name], MD [facility]ascular [facility] Ve[facility] Special[facility] of [facility] Phone Number: [phone] 04/30/2022 2:56 PM

## 2022-04-30 NOTE — ED Triage Notes (Signed)
Pt. Stated, I have a clotted fistulae that is clotted and it hurts and swollen. Started Monday

## 2022-04-30 NOTE — Anesthesia Preprocedure Evaluation (Signed)
Anesthesia Evaluation  Patient identified by MRN, date of birth, ID band Patient awake    Reviewed: Allergy & Precautions, NPO status , Patient's Chart, lab work & pertinent test results, reviewed documented beta blocker date and time   History of Anesthesia Complications Negative for: history of anesthetic complications  Airway Mallampati: II  TM Distance: >3 FB Neck ROM: Full    Dental no notable dental hx. (+) Dental Advisory Given   Pulmonary Current Smoker and Patient abstained from smoking.,    Pulmonary exam normal        Cardiovascular hypertension, Pt. on medications and Pt. on home beta blockers + CAD, + Past MI and +CHF (LVEF 45-50%)  Normal cardiovascular exam+ Valvular Problems/Murmurs (mild AI) AI   Echo 2019 - Left ventricle: The cavity size was normal. There was moderate  concentric hypertrophy. Systolic function was mildly reduced. The  estimated ejection fraction was in the range of 45% to 50%. There  is hypokinesis of the basal-midinferolateral and inferior  myocardium. The study is not technically sufficient to allow  evaluation of LV diastolic function.  - Aortic valve: There was mild regurgitation.  - Mitral valve: There was trivial regurgitation.  - Left atrium: The atrium was mildly dilated.  - Pulmonic valve: There was trivial regurgitation.    Neuro/Psych PSYCHIATRIC DISORDERS Depression negative neurological ROS     GI/Hepatic GERD  Controlled,  Endo/Other  diabetes (pre-diabetic)  Renal/GU ESRF and DialysisRenal diseaseClotted fistula K 4.5 today  negative genitourinary   Musculoskeletal  (+) Arthritis , Osteoarthritis,    Abdominal   Peds  Hematology  (+) Blood dyscrasia, anemia , Hb 8.3, plt 130   Anesthesia Other Findings   Reproductive/Obstetrics negative OB ROS                            Anesthesia Physical  Anesthesia Plan  ASA:  3  Anesthesia Plan: General   Post-op Pain Management: Tylenol PO (pre-op)*   Induction: Intravenous  PONV Risk Score and Plan: 1 and Ondansetron, Dexamethasone and Treatment may vary due to age or medical condition  Airway Management Planned:   Additional Equipment: None  Intra-op Plan:   Post-operative Plan: Extubation in OR  Informed Consent: I have reviewed the patients History and Physical, chart, labs and discussed the procedure including the risks, benefits and alternatives for the proposed anesthesia with the patient or authorized representative who has indicated his/her understanding and acceptance.       Plan Discussed with: Anesthesiologist and CRNA  Anesthesia Plan Comments:        Anesthesia Quick Evaluation

## 2022-05-01 ENCOUNTER — Inpatient Hospital Stay (HOSPITAL_COMMUNITY): Payer: No Typology Code available for payment source | Admitting: Anesthesiology

## 2022-05-01 ENCOUNTER — Ambulatory Visit (HOSPITAL_COMMUNITY)
Admission: AD | Admit: 2022-05-01 | Discharge: 2022-05-01 | Disposition: A | Discharge status: Home / Self Care | Payer: No Typology Code available for payment source | Source: Ambulatory Visit | Attending: Vascular Surgery | Admitting: Vascular Surgery

## 2022-05-01 ENCOUNTER — Encounter (HOSPITAL_COMMUNITY)
Admission: AD | Disposition: A | Discharge status: Home / Self Care | Payer: Self-pay | Source: Ambulatory Visit | Attending: Vascular Surgery

## 2022-05-01 ENCOUNTER — Inpatient Hospital Stay (HOSPITAL_COMMUNITY): Payer: No Typology Code available for payment source

## 2022-05-01 ENCOUNTER — Inpatient Hospital Stay (HOSPITAL_BASED_OUTPATIENT_CLINIC_OR_DEPARTMENT_OTHER): Payer: No Typology Code available for payment source | Admitting: Anesthesiology

## 2022-05-01 ENCOUNTER — Other Ambulatory Visit: Payer: Self-pay

## 2022-05-01 ENCOUNTER — Other Ambulatory Visit (HOSPITAL_COMMUNITY): Payer: Self-pay

## 2022-05-01 DIAGNOSIS — Y288XXA Contact with other sharp object, undetermined intent, initial encounter: Secondary | ICD-10-CM | POA: Insufficient documentation

## 2022-05-01 DIAGNOSIS — Z992 Dependence on renal dialysis: Secondary | ICD-10-CM | POA: Diagnosis not present

## 2022-05-01 DIAGNOSIS — I252 Old myocardial infarction: Secondary | ICD-10-CM | POA: Diagnosis not present

## 2022-05-01 DIAGNOSIS — I2510 Atherosclerotic heart disease of native coronary artery without angina pectoris: Secondary | ICD-10-CM | POA: Diagnosis not present

## 2022-05-01 DIAGNOSIS — I132 Hypertensive heart and chronic kidney disease with heart failure and with stage 5 chronic kidney disease, or end stage renal disease: Secondary | ICD-10-CM

## 2022-05-01 DIAGNOSIS — S41112A Laceration without foreign body of left upper arm, initial encounter: Secondary | ICD-10-CM | POA: Diagnosis not present

## 2022-05-01 DIAGNOSIS — I120 Hypertensive chronic kidney disease with stage 5 chronic kidney disease or end stage renal disease: Secondary | ICD-10-CM | POA: Diagnosis not present

## 2022-05-01 DIAGNOSIS — R7303 Prediabetes: Secondary | ICD-10-CM | POA: Diagnosis not present

## 2022-05-01 DIAGNOSIS — I509 Heart failure, unspecified: Secondary | ICD-10-CM | POA: Diagnosis not present

## 2022-05-01 DIAGNOSIS — T82867A Thrombosis of cardiac prosthetic devices, implants and grafts, initial encounter: Secondary | ICD-10-CM | POA: Diagnosis not present

## 2022-05-01 DIAGNOSIS — S40022A Contusion of left upper arm, initial encounter: Secondary | ICD-10-CM | POA: Diagnosis not present

## 2022-05-01 DIAGNOSIS — N186 End stage renal disease: Secondary | ICD-10-CM

## 2022-05-01 DIAGNOSIS — D631 Anemia in chronic kidney disease: Secondary | ICD-10-CM

## 2022-05-01 DIAGNOSIS — F17210 Nicotine dependence, cigarettes, uncomplicated: Secondary | ICD-10-CM

## 2022-05-01 HISTORY — PX: INSERTION OF DIALYSIS CATHETER: SHX1324

## 2022-05-01 HISTORY — PX: REVISON OF ARTERIOVENOUS FISTULA: SHX6074

## 2022-05-01 LAB — POCT I-STAT, CHEM 8
BUN: 46 mg/dL — ABNORMAL HIGH (ref 8–23)
Calcium, Ion: 1.04 mmol/L — ABNORMAL LOW (ref 1.15–1.40)
Chloride: 106 mmol/L (ref 98–111)
Creatinine, Ser: 6.6 mg/dL — ABNORMAL HIGH (ref 0.61–1.24)
Glucose, Bld: 94 mg/dL (ref 70–99)
HCT: 26 % — ABNORMAL LOW (ref 39.0–52.0)
Hemoglobin: 8.8 g/dL — ABNORMAL LOW (ref 13.0–17.0)
Potassium: 4.2 mmol/L (ref 3.5–5.1)
Sodium: 139 mmol/L (ref 135–145)
TCO2: 26 mmol/L (ref 22–32)

## 2022-05-01 SURGERY — ARTERIOVENOUS (AV) FISTULA CREATION
Anesthesia: Choice

## 2022-05-01 SURGERY — REVISON OF ARTERIOVENOUS FISTULA
Anesthesia: General | Site: Chest | Laterality: Right

## 2022-05-01 MED ORDER — HEPARIN 6000 UNIT IRRIGATION SOLUTION
Status: AC
  Filled 2022-05-01: qty 500

## 2022-05-01 MED ORDER — LIDOCAINE 2% (20 MG/ML) 5 ML SYRINGE
INTRAMUSCULAR | Status: DC | PRN
  Administered 2022-05-01: 100 mg via INTRAVENOUS

## 2022-05-01 MED ORDER — 0.9 % SODIUM CHLORIDE (POUR BTL) OPTIME
TOPICAL | Status: DC | PRN
  Administered 2022-05-01: 1000 mL

## 2022-05-01 MED ORDER — LIDOCAINE HCL (PF) 1 % IJ SOLN
INTRAMUSCULAR | Status: AC
  Filled 2022-05-01: qty 30

## 2022-05-01 MED ORDER — MIDAZOLAM HCL 2 MG/2ML IJ SOLN
INTRAMUSCULAR | Status: AC
  Filled 2022-05-01: qty 2

## 2022-05-01 MED ORDER — ORAL CARE MOUTH RINSE: 15.0000 mL | Freq: Once | OROMUCOSAL | Status: AC

## 2022-05-01 MED ORDER — SODIUM CHLORIDE 0.9 % IV SOLN: INTRAVENOUS | Status: DC | PRN

## 2022-05-01 MED ORDER — CHLORHEXIDINE GLUCONATE 0.12 % MT SOLN: 15.0000 mL | Freq: Once | OROMUCOSAL | Status: AC

## 2022-05-01 MED ORDER — GLYCOPYRROLATE 0.2 MG/ML IJ SOLN
INTRAMUSCULAR | Status: DC | PRN
  Administered 2022-05-01: .2 mg via INTRAVENOUS

## 2022-05-01 MED ORDER — FENTANYL CITRATE (PF) 250 MCG/5ML IJ SOLN
INTRAMUSCULAR | Status: AC
  Filled 2022-05-01: qty 5

## 2022-05-01 MED ORDER — MIDAZOLAM HCL 2 MG/2ML IJ SOLN
INTRAMUSCULAR | Status: DC | PRN
  Administered 2022-05-01: 1 mg via INTRAVENOUS

## 2022-05-01 MED ORDER — OXYCODONE-ACETAMINOPHEN 10-325 MG PO TABS
0.5000 | ORAL_TABLET | Freq: Four times a day (QID) | ORAL | 0 refills | Status: DC | PRN
  Filled 2022-05-01: qty 20, 10d supply, fill #0

## 2022-05-01 MED ORDER — CEFAZOLIN SODIUM-DEXTROSE 2-3 GM-%(50ML) IV SOLR
INTRAVENOUS | Status: DC | PRN
  Administered 2022-05-01: 2 g via INTRAVENOUS

## 2022-05-01 MED ORDER — FENTANYL CITRATE (PF) 100 MCG/2ML IJ SOLN: 25.0000 ug | INTRAMUSCULAR | Status: DC | PRN

## 2022-05-01 MED ORDER — ONDANSETRON HCL 4 MG/2ML IJ SOLN: 4.0000 mg | Freq: Once | INTRAMUSCULAR | Status: DC | PRN

## 2022-05-01 MED ORDER — CHLORHEXIDINE GLUCONATE 0.12 % MT SOLN
OROMUCOSAL | Status: AC
  Administered 2022-05-01: 15 mL via OROMUCOSAL
  Filled 2022-05-01: qty 15

## 2022-05-01 MED ORDER — PHENYLEPHRINE HCL-NACL 20-0.9 MG/250ML-% IV SOLN
INTRAVENOUS | Status: DC | PRN
  Administered 2022-05-01: 50 ug/min via INTRAVENOUS

## 2022-05-01 MED ORDER — PROPOFOL 10 MG/ML IV BOLUS
INTRAVENOUS | Status: DC | PRN
  Administered 2022-05-01: 200 mg via INTRAVENOUS

## 2022-05-01 MED ORDER — ONDANSETRON HCL 4 MG/2ML IJ SOLN
INTRAMUSCULAR | Status: DC | PRN
  Administered 2022-05-01: 4 mg via INTRAVENOUS

## 2022-05-01 MED ORDER — HEPARIN SODIUM (PORCINE) 1000 UNIT/ML IJ SOLN
INTRAMUSCULAR | Status: AC
  Filled 2022-05-01: qty 10

## 2022-05-01 MED ORDER — ALBUMIN HUMAN 5 % IV SOLN: INTRAVENOUS | Status: DC | PRN

## 2022-05-01 MED ORDER — EPHEDRINE SULFATE-NACL 50-0.9 MG/10ML-% IV SOSY
PREFILLED_SYRINGE | INTRAVENOUS | Status: DC | PRN
  Administered 2022-05-01: 7.5 mg via INTRAVENOUS
  Administered 2022-05-01 (×2): 5 mg via INTRAVENOUS

## 2022-05-01 MED ORDER — HEPARIN 6000 UNIT IRRIGATION SOLUTION
Status: DC | PRN
  Administered 2022-05-01: 1

## 2022-05-01 MED ORDER — DEXAMETHASONE SODIUM PHOSPHATE 10 MG/ML IJ SOLN
INTRAMUSCULAR | Status: DC | PRN
  Administered 2022-05-01: 10 mg via INTRAVENOUS

## 2022-05-01 MED ORDER — FENTANYL CITRATE (PF) 250 MCG/5ML IJ SOLN
INTRAMUSCULAR | Status: DC | PRN
  Administered 2022-05-01: 50 ug via INTRAVENOUS

## 2022-05-01 MED ORDER — HEPARIN SODIUM (PORCINE) 1000 UNIT/ML IJ SOLN
INTRAMUSCULAR | Status: DC | PRN
  Administered 2022-05-01: 3200 [IU]

## 2022-05-01 SURGICAL SUPPLY — 76 items
ADH SKN CLS LQ APL DERMABOND (GAUZE/BANDAGES/DRESSINGS) ×2
APL PRP STRL LF DISP 70% ISPRP (MISCELLANEOUS) ×2
APL SKNCLS STERI-STRIP NONHPOA (GAUZE/BANDAGES/DRESSINGS) ×2
ARMBAND PINK RESTRICT EXTREMIT (MISCELLANEOUS) ×2 IMPLANT
BAG COUNTER SPONGE SURGICOUNT (BAG) ×2 IMPLANT
BAG DECANTER FOR FLEXI CONT (MISCELLANEOUS) ×2 IMPLANT
BAG SPNG CNTER NS LX DISP (BAG) ×2
BENZOIN TINCTURE PRP APPL 2/3 (GAUZE/BANDAGES/DRESSINGS) ×2 IMPLANT
BIOPATCH BLUE 3/4IN DISK W/1.5 (GAUZE/BANDAGES/DRESSINGS) IMPLANT
BIOPATCH RED 1 DISK 7.0 (GAUZE/BANDAGES/DRESSINGS) ×2 IMPLANT
BNDG CMPR MED 10X6 ELC LF (GAUZE/BANDAGES/DRESSINGS) ×2
BNDG ELASTIC 4X5.8 VLCR STR LF (GAUZE/BANDAGES/DRESSINGS) IMPLANT
BNDG ELASTIC 6X10 VLCR STRL LF (GAUZE/BANDAGES/DRESSINGS) IMPLANT
BNDG GAUZE DERMACEA FLUFF 4 (GAUZE/BANDAGES/DRESSINGS) IMPLANT
BNDG GZE DERMACEA 4 6PLY (GAUZE/BANDAGES/DRESSINGS) ×2
CANISTER PREVENA PLUS 150 (CANNISTER) IMPLANT
CANISTER SUCT 3000ML PPV (MISCELLANEOUS) ×2 IMPLANT
CANNULA VESSEL 3MM 2 BLNT TIP (CANNULA) ×2 IMPLANT
CATH PALINDROME-P 19CM W/VT (CATHETERS) IMPLANT
CATH PALINDROME-P 23CM W/VT (CATHETERS) IMPLANT
CATH PALINDROME-P 28CM W/VT (CATHETERS) IMPLANT
CHLORAPREP W/TINT 26 (MISCELLANEOUS) ×2 IMPLANT
COVER PROBE W GEL 5X96 (DRAPES) ×2 IMPLANT
COVER SURGICAL LIGHT HANDLE (MISCELLANEOUS) ×2 IMPLANT
DERMABOND ADVANCED .7 DNX6 (GAUZE/BANDAGES/DRESSINGS) IMPLANT
DRAIN PENROSE 12X.25 LTX STRL (MISCELLANEOUS) IMPLANT
DRAPE C-ARM 42X72 X-RAY (DRAPES) ×2 IMPLANT
DRAPE CHEST BREAST 15X10 FENES (DRAPES) ×2 IMPLANT
DRESSING PEEL AND PLC PRVNA 13 (GAUZE/BANDAGES/DRESSINGS) IMPLANT
DRSG COVADERM 4X6 (GAUZE/BANDAGES/DRESSINGS) IMPLANT
DRSG PEEL AND PLACE PREVENA 13 (GAUZE/BANDAGES/DRESSINGS) ×2
ELECT REM PT RETURN 9FT ADLT (ELECTROSURGICAL) ×2
ELECTRODE REM PT RTRN 9FT ADLT (ELECTROSURGICAL) ×2 IMPLANT
GAUZE 4X4 16PLY ~~LOC~~+RFID DBL (SPONGE) ×2 IMPLANT
GAUZE SPONGE 4X4 12PLY STRL (GAUZE/BANDAGES/DRESSINGS) ×2 IMPLANT
GLOVE BIO SURGEON STRL SZ8 (GLOVE) ×2 IMPLANT
GLOVE ECLIPSE 6.5 STRL STRAW (GLOVE) IMPLANT
GOWN STRL REUS W/ TWL LRG LVL3 (GOWN DISPOSABLE) ×4 IMPLANT
GOWN STRL REUS W/ TWL XL LVL3 (GOWN DISPOSABLE) ×2 IMPLANT
GOWN STRL REUS W/TWL LRG LVL3 (GOWN DISPOSABLE) ×4
GOWN STRL REUS W/TWL XL LVL3 (GOWN DISPOSABLE) ×2
INSERT FOGARTY SM (MISCELLANEOUS) IMPLANT
KIT BASIN OR (CUSTOM PROCEDURE TRAY) ×2 IMPLANT
KIT DRSG PREVENA PLUS 7DAY 125 (MISCELLANEOUS) IMPLANT
KIT PALINDROME-P 55CM (CATHETERS) IMPLANT
KIT TURNOVER KIT B (KITS) ×2 IMPLANT
MARKER SKIN DUAL TIP RULER LAB (MISCELLANEOUS) IMPLANT
NDL 18GX1X1/2 (RX/OR ONLY) (NEEDLE) ×2 IMPLANT
NDL HYPO 25GX1X1/2 BEV (NEEDLE) ×2 IMPLANT
NEEDLE 18GX1X1/2 (RX/OR ONLY) (NEEDLE) ×2 IMPLANT
NEEDLE HYPO 25GX1X1/2 BEV (NEEDLE) ×2 IMPLANT
NS IRRIG 1000ML POUR BTL (IV SOLUTION) ×2 IMPLANT
PACK BASIC III (CUSTOM PROCEDURE TRAY) ×2
PACK CV ACCESS (CUSTOM PROCEDURE TRAY) ×2 IMPLANT
PACK SRG BSC III STRL LF ECLPS (CUSTOM PROCEDURE TRAY) ×2 IMPLANT
PAD ARMBOARD 7.5X6 YLW CONV (MISCELLANEOUS) ×4 IMPLANT
SET MICROPUNCTURE 5F STIFF (MISCELLANEOUS) ×2 IMPLANT
SLING ARM FOAM STRAP LRG (SOFTGOODS) IMPLANT
SLING ARM FOAM STRAP MED (SOFTGOODS) IMPLANT
SOAP 2 % CHG 4 OZ (WOUND CARE) ×2 IMPLANT
STAPLER VISISTAT 35W (STAPLE) IMPLANT
STRIP CLOSURE SKIN 1/2X4 (GAUZE/BANDAGES/DRESSINGS) ×2 IMPLANT
SUT ETHILON 3 0 PS 1 (SUTURE) ×2 IMPLANT
SUT MNCRL AB 4-0 PS2 18 (SUTURE) ×2 IMPLANT
SUT PROLENE 6 0 BV (SUTURE) ×2 IMPLANT
SUT VIC AB 3-0 SH 27 (SUTURE) ×2
SUT VIC AB 3-0 SH 27X BRD (SUTURE) ×2 IMPLANT
SYR 10ML LL (SYRINGE) ×2 IMPLANT
SYR 20ML LL LF (SYRINGE) ×4 IMPLANT
SYR 3ML LL SCALE MARK (SYRINGE) ×2 IMPLANT
SYR 5ML LL (SYRINGE) ×2 IMPLANT
SYR CONTROL 10ML LL (SYRINGE) ×2 IMPLANT
TOWEL GREEN STERILE (TOWEL DISPOSABLE) ×2 IMPLANT
TOWEL GREEN STERILE FF (TOWEL DISPOSABLE) ×4 IMPLANT
UNDERPAD 30X36 HEAVY ABSORB (UNDERPADS AND DIAPERS) ×2 IMPLANT
WATER STERILE IRR 1000ML POUR (IV SOLUTION) ×2 IMPLANT

## 2022-05-01 NOTE — Transfer of Care (Signed)
Immediate Anesthesia Transfer of Care Note  Patient: Eaton Folmar  Procedure(s) Performed: ARTERIOVENOUS FISTULA WASHOUT OF ARM HEMATOMA (Left: Arm Upper) INSERTION OF DIALYSIS CATHETER (Right: Chest)  Patient Location: PACU  Anesthesia Type:General  Level of Consciousness: awake, alert  and sedated  Airway & Oxygen Therapy: Patient connected to face mask oxygen  Post-op Assessment: Post -op Vital signs reviewed and stable  Post vital signs: stable  Last Vitals:  Vitals Value Taken Time  BP    Temp    Pulse    Resp 22 05/01/22 1102  SpO2    Vitals shown include unvalidated device data.  Last Pain:  Vitals:   05/01/22 0807  TempSrc:   PainSc: 8          Complications: No notable events documented.

## 2022-05-01 NOTE — Progress Notes (Signed)
Contacted by staff regarding pt's need for out-pt HD at clinic this afternoon. Contacted [facility] and spoke to [place]. Clinic staff report that pt's wife has been in contact with clinic staff and confirmed that pt is expected at clinic this afternoon for out-pt HD. Clinic is expecting pt around 1:00. Vascular staff paged and made aware of the above info. Vascular staff states that plan should work out and to speak with pt to make him aware of leaving hospital to proceed straight to HD clinic for treatment.   Melven Sartorius Renal Navigator [phone]

## 2022-05-01 NOTE — Discharge Instructions (Signed)
° °  [facility] ° °Discharge Instructions ° °AV Fistula or Graft Surgery for Dialysis Access ° °Please refer to the following instructions for your post-procedure care. Your surgeon or physician assistant will discuss any changes with you. ° °Activity ° °You may drive the day following your surgery, if you are comfortable and no longer taking prescription pain medication. Resume full activity as the soreness in your incision resolves. ° °Bathing/Showering ° °You may shower after you go home. Keep your incision dry for 48 hours. Do not soak in a bathtub, hot tub, or swim until the incision heals completely. You may not shower if you have a hemodialysis catheter. ° °Incision Care ° °Clean your incision with mild soap and water after 48 hours. Pat the area dry with a clean towel. You do not need a bandage unless otherwise instructed. Do not apply any ointments or creams to your incision. You may have skin glue on your incision. Do not peel it off. It will come off on its own in about one week. Your arm may swell a bit after surgery. To reduce swelling use pillows to elevate your arm so it is above your heart. Your doctor will tell you if you need to lightly wrap your arm with an ACE bandage. ° °Diet ° °Resume your normal diet. There are not special food restrictions following this procedure. In order to heal from your surgery, it is CRITICAL to get adequate nutrition. Your body requires vitamins, minerals, and protein. Vegetables are the best source of vitamins and minerals. Vegetables also provide the perfect balance of protein. Processed food has little nutritional value, so try to avoid this. ° °Medications ° °Resume taking all of your medications. If your incision is causing pain, you may take over-the counter pain relievers such as acetaminophen (Tylenol). If you were prescribed a stronger pain medication, please be aware these medications can cause nausea and constipation. Prevent  nausea by taking the medication with a snack or meal. Avoid constipation by drinking plenty of fluids and eating foods with high amount of fiber, such as fruits, vegetables, and grains. Do not take Tylenol if you are taking prescription pain medications. ° ° ° ° °Follow up °Your surgeon may want to see you in the office following your access surgery. If so, this will be arranged at the time of your surgery. ° °Please call us immediately for any of the following conditions: ° °Increased pain, redness, drainage (pus) from your incision site °Fever of 101 degrees or higher °Severe or worsening pain at your incision site °Hand pain or numbness. ° °Reduce your risk of vascular disease: ° °Stop smoking. If you would like help, call [facility] at 1-800-QUIT-NOW (1-800-784-8669) or [facility] at 336-586-4000 ° °Manage your cholesterol °Maintain a desired weight °Control your diabetes °Keep your blood pressure down ° °Dialysis ° °It will take several weeks to several months for your new dialysis access to be ready for use. Your surgeon will determine when it is OK to use it. Your nephrologist will continue to direct your dialysis. You can continue to use your Permcath until your new access is ready for use. ° °If you have any questions, please call the office at [phone]. ° °

## 2022-05-01 NOTE — Interval H&P Note (Signed)
History and Physical Interval Note:  05/01/2022 9:00 AM  Ronald Reeves  has presented today for surgery, with the diagnosis of End stage renal disease.  The various methods of treatment have been discussed with the patient and family. After consideration of risks, benefits and other options for treatment, the patient has consented to  Procedure(s): ARTERIOVENOUS FISTULA WASHOUT OF ARM HEMATOMA (Left) INSERTION OF DIALYSIS CATHETER (N/A) as a surgical intervention.  The patient's history has been reviewed, patient examined, no change in status, stable for surgery.  I have reviewed the patient's chart and labs.  Questions were answered to the patient's satisfaction.     [name]

## 2022-05-01 NOTE — Anesthesia Procedure Notes (Signed)
Procedure Name: LMA Insertion Date/Time: 05/01/2022 9:49 AM  Performed by: Lavell Luster, CRNAPre-anesthesia Checklist: Patient identified, Emergency Drugs available, Suction available, Patient being monitored and Timeout performed Patient Re-evaluated:Patient Re-evaluated prior to induction Oxygen Delivery Method: Circle system utilized Preoxygenation: Pre-oxygenation with 100% oxygen Induction Type: IV induction LMA: LMA flexible inserted LMA Size: 4.0 Placement Confirmation: breath sounds checked- equal and bilateral and positive ETCO2 Tube secured with: Tape Dental Injury: Teeth and Oropharynx as per pre-operative assessment

## 2022-05-01 NOTE — Op Note (Signed)
DATE OF SERVICE: 05/01/2022  PATIENT:  Ronald Reeves  66 y.o. male  PRE-OPERATIVE DIAGNOSIS:  hematoma in left arm after traumatic cannulation for dialysis  POST-OPERATIVE DIAGNOSIS:  Same  PROCEDURE:   1) placement of right internal jugular tunneled dialysis catheter (19cm) 2) evacuation of left arm hematoma  3) direct repair of left brachiobasilic arteriovenous fistula  SURGEON:  Surgeon(s) and Role:    * [name], MD - Primary  ASSISTANT: none  ANESTHESIA:   general  EBL: minimal from OR; >319m hematoma evacuated from arm  BLOOD ADMINISTERED:none  DRAINS: Penrose drain in the hematoma cavity    LOCAL MEDICATIONS USED:  NONE  SPECIMEN:  none  COUNTS: confirmed correct.  TOURNIQUET:  none  PATIENT DISPOSITION:  PACU - hemodynamically stable.   Delay start of Pharmacological VTE agent (>24hrs) due to surgical blood loss or risk of bleeding: no  INDICATION FOR PROCEDURE: Ronald Reeves a 66y.o. male with end-stage renal disease.  He suffered a traumatic cannulation on Monday, 04/28/2022.  He presented to care yesterday, but ate before surgical care could be delivered.  I arrange for outpatient intervention today. After careful discussion of risks, benefits, and alternatives the patient was offered evacuation of hematoma and placement of tunneled dialysis catheter. The patient understood and wished to proceed.  OPERATIVE FINDINGS: Unremarkable placement of right internal jugular tunneled dialysis catheter.  Catheter tested and working appropriately at completion.  Greater than 300 cc of old hematoma was evacuated from the arm.  Laceration repaired with simple Prolene suture. A Penrose drain was left in the cavity and a Prevena dressing applied.  DESCRIPTION OF PROCEDURE: After identification of the patient in the pre-operative holding area, the patient was transferred to the operating room. The patient was positioned supine on the operating room table. Anesthesia was  induced. The neck, chest, and left arm was prepped and draped in standard fashion. A surgical pause was performed confirming correct patient, procedure, and operative location.  Using ultrasound guidance the right internal jugular vein was accessed with micropuncture technique.  Through the micropuncture sheath a floppy J-wire was advanced into the superior vena cava.  A small incision was made around the skin access point.  The access point was serially dilated under direct fluoroscopic guidance.  A peel-away sheath was introduced into the superior vena cava under fluoroscopic guidance.  A counterincision was made in the chest under the clavicle.  A 23 cm tunnel dialysis catheter was then tunneled under the skin, over the clavicle into the incision in the neck.  The tunneling device was removed and the catheter fed through the peel-away sheath into the superior vena cava.  The peel-away sheath was removed and the catheter gently pulled back.  Adequate position was confirmed with x-ray.  The catheter was tested and found to flush and draw back well.  Catheter was heparin locked.  Caps were applied.  Catheter was sutured to the skin.  The neck incision was closed with 4-0 Monocryl.  Using intraoperative ultrasound, the course of the left arm fistula was marked in the skin.  The apex of the hematoma was marked and a several centimeter incision made.  The hematoma was evacuated.  Greater than 300 cc of old blood was encountered.  Laceration consistent with traumatic cannulation was encountered in the fistula.  This was repaired with 6-0 Prolene.  A Penrose drain was left in the hematoma cavity after hemostasis was achieved.  The wound was closed in layers using 3-0 Vicryl  and a skin stapler.  A Prevena dressing was applied to the incision.  The arm was wrapped with Kerlix, 4 inch Ace wrap, 6 inch Ace wrap.  Upon completion of the case instrument and sharps counts were confirmed correct. The patient was  transferred to the PACU in good condition. I was present for all portions of the procedure.  Ronald Reeves. [name], MD [facility] Phone Number: [phone] 05/01/2022 10:51 AM

## 2022-05-02 ENCOUNTER — Encounter (HOSPITAL_COMMUNITY): Payer: Self-pay | Admitting: Vascular Surgery

## 2022-05-02 NOTE — Anesthesia Postprocedure Evaluation (Signed)
Anesthesia Post Note  Patient: Ronald Reeves  Procedure(s) Performed: ARTERIOVENOUS FISTULA WASHOUT OF ARM HEMATOMA (Left: Arm Upper) INSERTION OF DIALYSIS CATHETER (Right: Chest)     Patient location during evaluation: PACU Anesthesia Type: General Level of consciousness: sedated Pain management: pain level controlled Vital Signs Assessment: post-procedure vital signs reviewed and stable Respiratory status: spontaneous breathing and respiratory function stable Cardiovascular status: stable Postop Assessment: no apparent nausea or vomiting Anesthetic complications: no   No notable events documented.  Last Vitals:  Vitals:   05/01/22 1115 05/01/22 1130  BP: (!) 151/67 (!) 160/66  Pulse: 63 65  Resp: 17 16  Temp:    SpO2: 98% 100%    Last Pain:  Vitals:   05/01/22 1130  TempSrc:   PainSc: 0-No pain                 [name]

## 2022-05-05 ENCOUNTER — Encounter: Payer: Self-pay | Admitting: Nurse Practitioner

## 2022-05-06 ENCOUNTER — Encounter (HOSPITAL_COMMUNITY): Payer: Self-pay | Admitting: Emergency Medicine

## 2022-05-06 ENCOUNTER — Other Ambulatory Visit: Payer: Self-pay

## 2022-05-06 ENCOUNTER — Emergency Department (HOSPITAL_COMMUNITY): Payer: No Typology Code available for payment source

## 2022-05-06 ENCOUNTER — Emergency Department (HOSPITAL_COMMUNITY)
Admission: EM | Admit: 2022-05-06 | Discharge: 2022-05-07 | Disposition: A | Discharge status: Home / Self Care | Payer: No Typology Code available for payment source | Attending: Emergency Medicine | Admitting: Emergency Medicine

## 2022-05-06 DIAGNOSIS — S59912D Unspecified injury of left forearm, subsequent encounter: Secondary | ICD-10-CM | POA: Diagnosis present

## 2022-05-06 DIAGNOSIS — Z5189 Encounter for other specified aftercare: Secondary | ICD-10-CM

## 2022-05-06 DIAGNOSIS — Z7982 Long term (current) use of aspirin: Secondary | ICD-10-CM | POA: Diagnosis not present

## 2022-05-06 DIAGNOSIS — S51802D Unspecified open wound of left forearm, subsequent encounter: Secondary | ICD-10-CM | POA: Insufficient documentation

## 2022-05-06 DIAGNOSIS — W231XXD Caught, crushed, jammed, or pinched between stationary objects, subsequent encounter: Secondary | ICD-10-CM | POA: Insufficient documentation

## 2022-05-06 DIAGNOSIS — Z4800 Encounter for change or removal of nonsurgical wound dressing: Secondary | ICD-10-CM | POA: Diagnosis not present

## 2022-05-06 LAB — CULTURE, BLOOD (ROUTINE X 2): Culture: NO GROWTH

## 2022-05-06 NOTE — ED Triage Notes (Signed)
Patient was taking garbage out and caught the tubing on the garbage can and the wound vac came off his left upper arm where he had a hematoma evacuated.  Wound seen, staples intact at would.  Patient had a penrose drain in also which has also come out.  No bleeding, no drainage seen at staple sites.

## 2022-05-06 NOTE — ED Provider Triage Note (Signed)
Emergency Medicine Provider Triage Evaluation Note  Ronald Reeves , a 66 y.o. male  was evaluated in triage.  Pt complains of wound check.  Patient had hematoma drained to his left fistula on the 21st of this month.  Patient reports that he got his wound VAC caught on the garbage can last night and pulled the wound VAC out.  Patient did have dialysis today.  They recommended patient come to the ER for further evaluation.  Reports some mild pain to the incision site over the left bicep.  No fevers or chills..  Review of Systems  Positive: Wound Negative: Fever  Physical Exam  BP (!) 148/60 (BP Location: Right Arm)   Pulse 75   Temp 98.3 F (36.8 C) (Oral)   Resp 18   SpO2 100%  Gen:   Awake, no distress   Resp:  Normal effort  MSK:   Moves extremities without difficulty  Other:  Patient has a healing incision to the left bicep with staples in place.  The wound is very well-appearing.  There is no erythema, warmth, drainage, bleeding, fluctuance, induration  Medical Decision Making  Medically screening exam initiated at 8:21 PM.  Appropriate orders placed.  Ronald Reeves was informed that the remainder of the evaluation will be completed by another provider, this initial triage assessment does not replace that evaluation, and the importance of remaining in the ED until their evaluation is complete.  Patient presents for wound check.  Patient pulled out his wound VAC.  Wound is very well-appearing.  No signs of cellulitis or deep space infection.  X-ray pending at this time.   Doristine Devoid, PA-C 05/06/22 2022

## 2022-05-07 MED ORDER — HYDROCODONE-ACETAMINOPHEN 5-325 MG PO TABS
1.0000 | ORAL_TABLET | Freq: Once | ORAL | Status: AC
  Administered 2022-05-07: 1 via ORAL
  Filled 2022-05-07: qty 1

## 2022-05-07 NOTE — ED Notes (Signed)
Vascular provider at bedside

## 2022-05-07 NOTE — Discharge Instructions (Addendum)
Follow-up in the office as discussed with your vascular surgeon

## 2022-05-07 NOTE — ED Notes (Signed)
Pt called 2x no answer 

## 2022-05-07 NOTE — ED Provider Notes (Signed)
[facility] EMERGENCY DEPARTMENT Provider Note   CSN: [id number] Arrival date & time: 05/06/22  1840     History  Chief Complaint  Patient presents with   wound vac problem    Ronald Reeves is a 66 y.o. male.  HPI   Patient presents with issue for his left arm wound.  Patient underwent evacuation of the hematoma of his left arm on September 21 .  He reports that earlier in the evening he accidentally pulled off the wound VAC when he was moving a garbage can.  He reports increased pain and swelling over the area of his arm. No fevers or vomiting.  He reports he recently underwent dialysis.  Home Medications Prior to Admission medications   Medication Sig Start Date End Date Taking? Authorizing Provider  albuterol (VENTOLIN HFA) 108 (90 Base) MCG/ACT inhaler Inhale 2 puffs into the lungs every 6 (six) hours as needed for wheezing or shortness of breath.    [provider]  amLODipine (NORVASC) 10 MG tablet Take 1 tablet (10 mg total) by mouth daily. 01/01/18   Lelon Perla, MD  aspirin EC 81 MG tablet Take 81 mg by mouth daily.    [provider]  diclofenac Sodium (VOLTAREN) 1 % GEL Apply 2 g topically 3 (three) times daily as needed (to affected areas for pain).    [provider]  furosemide (LASIX) 20 MG tablet Take 10 mg by mouth daily as needed for fluid or edema. 11/14/19   [provider]  hydrALAZINE (APRESOLINE) 25 MG tablet Take 2 tablets (50 mg total) by mouth every morning. Patient taking differently: Take 25 mg by mouth 2 (two) times daily. 01/01/18   Lelon Perla, MD  ibuprofen (ADVIL) 800 MG tablet Take 800 mg by mouth every 8 (eight) hours as needed for mild pain.    [provider]  isosorbide mononitrate (IMDUR) 30 MG 24 hr tablet Take 1 tablet (30 mg total) by mouth daily. 01/01/18   Lelon Perla, MD  labetalol (NORMODYNE) 100 MG tablet Take 2 tablets (200 mg total) by mouth every  morning. Patient taking differently: Take 100 mg by mouth 2 (two) times daily. 01/01/18   Lelon Perla, MD  multivitamin (RENA-VIT) TABS tablet Take 1 tablet by mouth daily. 11/12/21   [provider]  Nicotine (NICODERM CQ TD) Place 1 patch onto the skin daily as needed (for smoking cessation). Patient not taking: Reported on 04/30/2022    [provider]  Nutritional Supplements (ENSURE CLEAR) LIQD Take 1 Bottle by mouth in the morning and at bedtime.    [provider]  oxyCODONE-acetaminophen (PERCOCET) 10-325 MG tablet Take 0.5 tablets by mouth every 6 (six) hours as needed for pain. 05/01/22 05/01/23  Dagoberto Ligas, PA-C  potassium chloride SA (KLOR-CON M) 20 MEQ tablet Take 2 tablets (40 mEq total) by mouth 2 (two) times daily. Patient not taking: Reported on 11/17/8117 1/47/82   Delora Fuel, MD  rosuvastatin (CRESTOR) 40 MG tablet Take 40 mg by mouth every other day. 02/04/21   [provider]  tamsulosin (FLOMAX) 0.4 MG CAPS capsule Take 0.8 mg by mouth in the morning.    [provider]      Allergies    Patient has no known allergies.    Review of Systems   Review of Systems  Constitutional:  Negative for fever.  Skin:  Positive for wound.    Physical Exam Updated Vital Signs BP Marland Kitchen)  182/96   Pulse 64   Temp 97.8 F (36.6 C) (Oral)   Resp 16   SpO2 100%  Physical Exam CONSTITUTIONAL: Chronically ill-appearing, no acute distress HEAD: Normocephalic/atraumatic EYES: EOMI ENMT: Mucous membranes moist NECK: supple no meningeal signs NEURO: Pt is awake/alert/appropriate, moves all extremitiesx4.  No facial droop.   EXTREMITIES: pulses normal/equal, full ROM Distal pulses intact in left arm.  Patient has tenderness, swelling and firmness noted over the left bicep.  No active bleeding or drainage.  No crepitus SKIN: warm, color normal PSYCH: Mildly anxious     ED Results / Procedures / Treatments   Labs (all labs ordered  are listed, but only abnormal results are displayed) Labs Reviewed - No data to display  EKG None  Radiology DG Humerus Left  Result Date: 05/06/2022 CLINICAL DATA:  Left arm pain, pulled out wound VAC. EXAM: LEFT HUMERUS - 2+ VIEW COMPARISON:  None Available. FINDINGS: No acute fracture or dislocation. Moderate degenerative changes are noted at the acromioclavicular joint and humeral head. Subcutaneous fat stranding and surgical clips are noted in the medial aspect of the mid upper arm. Multiple surgical clips are seen in the subcutaneous tissues in the left upper extremity. IMPRESSION: No acute osseous abnormality. Electronically Signed   By: Brett Fairy M.D.   On: 05/06/2022 20:46    Procedures Procedures    Medications Ordered in ED Medications  HYDROcodone-acetaminophen (NORCO/VICODIN) 5-325 MG per tablet 1 tablet (1 tablet Oral Given 05/07/22 0630)    ED Course/ Medical Decision Making/ A&P Clinical Course as of 05/07/22 0703  Wed May 07, 2022  0609 Discussed with Dr. Scot Dock with vascular surgery.  He will see the patient [DW]  0703 Patient seen by vascular surgery Dr. Scot Dock.  He feels patient is safe for discharge home and follow-up.  No intervention is required [DW]    Clinical Course User Index [DW] Ripley Fraise, MD                           Medical Decision Making Risk Prescription drug management.           Final Clinical Impression(s) / ED Diagnoses Final diagnoses:  Visit for wound check    Rx / DC Orders ED Discharge Orders     None         Ripley Fraise, MD 05/07/22 [phone]

## 2022-05-07 NOTE — Consult Note (Signed)
ASSESSMENT & PLAN   S/P REPAIR OF LEFT UPPER ARM FISTULA: This patient had a large infiltrate and required evacuation of a large hematoma and repair of the fistula on 05/01/2022.  At the same time a catheter was placed for dialysis.  A Prevena dressing was placed on the wound.  He presented to the emergency department because the [facility] had come off.  The graft is pulsatile but does have a bruit.  There is no significant swelling.  I will make sure that he has arrangements to have his staples removed in the office.  However currently I do not see any issues of concern with his left upper arm fistula.  REASON FOR CONSULT:    Swelling in left arm  HPI:   Ronald Reeves is a 66 y.o. male with end-stage renal disease.  He had an infiltrate in his left arm fistula on 04/28/2022.  On 05/01/2022 he had placement of a right IJ tunneled dialysis catheter, evacuation of the hematoma in the left arm, and repair of the left brachial basilic AV fistula.  300 cc of blood were evacuated.  An injury to the fistula was repaired with a 6-0 Prolene.  A Penrose drain was left in place.  A Prevena dressing was applied.  He accidentally pulled off his Prevena dressing and presented to the emergency department.  Past Medical History:  Diagnosis Date   Anemia    Arthritis    Back pain    Bronchitis    Chronic kidney disease    Coronary artery disease    GERD (gastroesophageal reflux disease)    not a current problem   Heart murmur    Hypertension    Myocardial infarction ([place])    Pre-diabetes    does not have CBG machine.   Substance abuse ([place])     Family History  Problem Relation Age of Onset   Heart disease Mother    Heart attack Sister 58    SOCIAL HISTORY: Social History   Tobacco Use   Smoking status: Every Day    Types: Cigars   Smokeless tobacco: Current   Tobacco comments:    3 black and mild a day  Substance Use Topics   Alcohol use: Yes    Alcohol/week: 14.0 standard drinks of  alcohol    Types: 14 Cans of beer per week    Comment: Daily    No Known Allergies  Current Facility-Administered Medications  Medication Dose Route Frequency Provider Last Rate Last Admin   HYDROcodone-acetaminophen (NORCO/VICODIN) 5-325 MG per tablet 1 tablet  1 tablet Oral Once Ripley Fraise, MD       Current Outpatient Medications  Medication Sig Dispense Refill   albuterol (VENTOLIN HFA) 108 (90 Base) MCG/ACT inhaler Inhale 2 puffs into the lungs every 6 (six) hours as needed for wheezing or shortness of breath.     amLODipine (NORVASC) 10 MG tablet Take 1 tablet (10 mg total) by mouth daily. 90 tablet 3   aspirin EC 81 MG tablet Take 81 mg by mouth daily.     diclofenac Sodium (VOLTAREN) 1 % GEL Apply 2 g topically 3 (three) times daily as needed (to affected areas for pain).     furosemide (LASIX) 20 MG tablet Take 10 mg by mouth daily as needed for fluid or edema.     hydrALAZINE (APRESOLINE) 25 MG tablet Take 2 tablets (50 mg total) by mouth every morning. (Patient taking differently: Take 25 mg by mouth 2 (two) times daily.)  180 tablet 3   ibuprofen (ADVIL) 800 MG tablet Take 800 mg by mouth every 8 (eight) hours as needed for mild pain.     isosorbide mononitrate (IMDUR) 30 MG 24 hr tablet Take 1 tablet (30 mg total) by mouth daily. 90 tablet 3   labetalol (NORMODYNE) 100 MG tablet Take 2 tablets (200 mg total) by mouth every morning. (Patient taking differently: Take 100 mg by mouth 2 (two) times daily.) 180 tablet 3   multivitamin (RENA-VIT) TABS tablet Take 1 tablet by mouth daily.     Nicotine (NICODERM CQ TD) Place 1 patch onto the skin daily as needed (for smoking cessation). (Patient not taking: Reported on 04/30/2022)     Nutritional Supplements (ENSURE CLEAR) LIQD Take 1 Bottle by mouth in the morning and at bedtime.     oxyCODONE-acetaminophen (PERCOCET) 10-325 MG tablet Take 0.5 tablets by mouth every 6 (six) hours as needed for pain. 20 tablet 0   potassium chloride  SA (KLOR-CON M) 20 MEQ tablet Take 2 tablets (40 mEq total) by mouth 2 (two) times daily. (Patient not taking: Reported on 04/30/2022) 4 tablet 0   rosuvastatin (CRESTOR) 40 MG tablet Take 40 mg by mouth every other day.     tamsulosin (FLOMAX) 0.4 MG CAPS capsule Take 0.8 mg by mouth in the morning.      REVIEW OF SYSTEMS:  '[X]'$  denotes positive finding, '[ ]'$  denotes negative finding Cardiac  Comments:  Chest pain or chest pressure:    Shortness of breath upon exertion:    Short of breath when lying flat:    Irregular heart rhythm:        Vascular    Pain in calf, thigh, or hip brought on by ambulation:    Pain in feet at night that wakes you up from your sleep:     Blood clot in your veins:    Leg swelling:         Pulmonary    Oxygen at home:    Productive cough:     Wheezing:         Neurologic    Sudden weakness in arms or legs:     Sudden numbness in arms or legs:     Sudden onset of difficulty speaking or slurred speech:    Temporary loss of vision in one eye:     Problems with dizziness:         Gastrointestinal    Blood in stool:     Vomited blood:         Genitourinary    Burning when urinating:     Blood in urine:        Psychiatric    Major depression:         Hematologic    Bleeding problems:    Problems with blood clotting too easily:        Skin    Rashes or ulcers:        Constitutional    Fever or chills:    -  PHYSICAL EXAM:   Vitals:   05/06/22 1905 05/06/22 2217 05/07/22 0153  BP: (!) 148/60 135/87 (!) 160/62  Pulse: 75 82 73  Resp: '18 17 17  '$ Temp: 98.3 F (36.8 C) 98.9 F (37.2 C) 98.4 F (36.9 C)  TempSrc: Oral  Oral  SpO2: 100% 98% 100%   There is no height or weight on file to calculate BMI. GENERAL: The patient is a well-nourished male, in no acute distress.  The vital signs are documented above. CARDIAC: There is a regular rate and rhythm.  VASCULAR: His fistula in the left upper arm is slightly pulsatile but does have a  bruit.   He has a palpable left radial pulse. PULMONARY: There is good air exchange bilaterally without wheezing or rales.  DATA:    No new data.  Deitra Mayo [facility]

## 2022-05-08 IMAGING — CR DG FOOT COMPLETE 3+V*R*
3 series · 3 of 3 positions shown · non-contrast
Comparison: None.

CLINICAL DATA: Foot pain

EXAM:
RIGHT FOOT COMPLETE - 3+ VIEW

[x foot ap right]
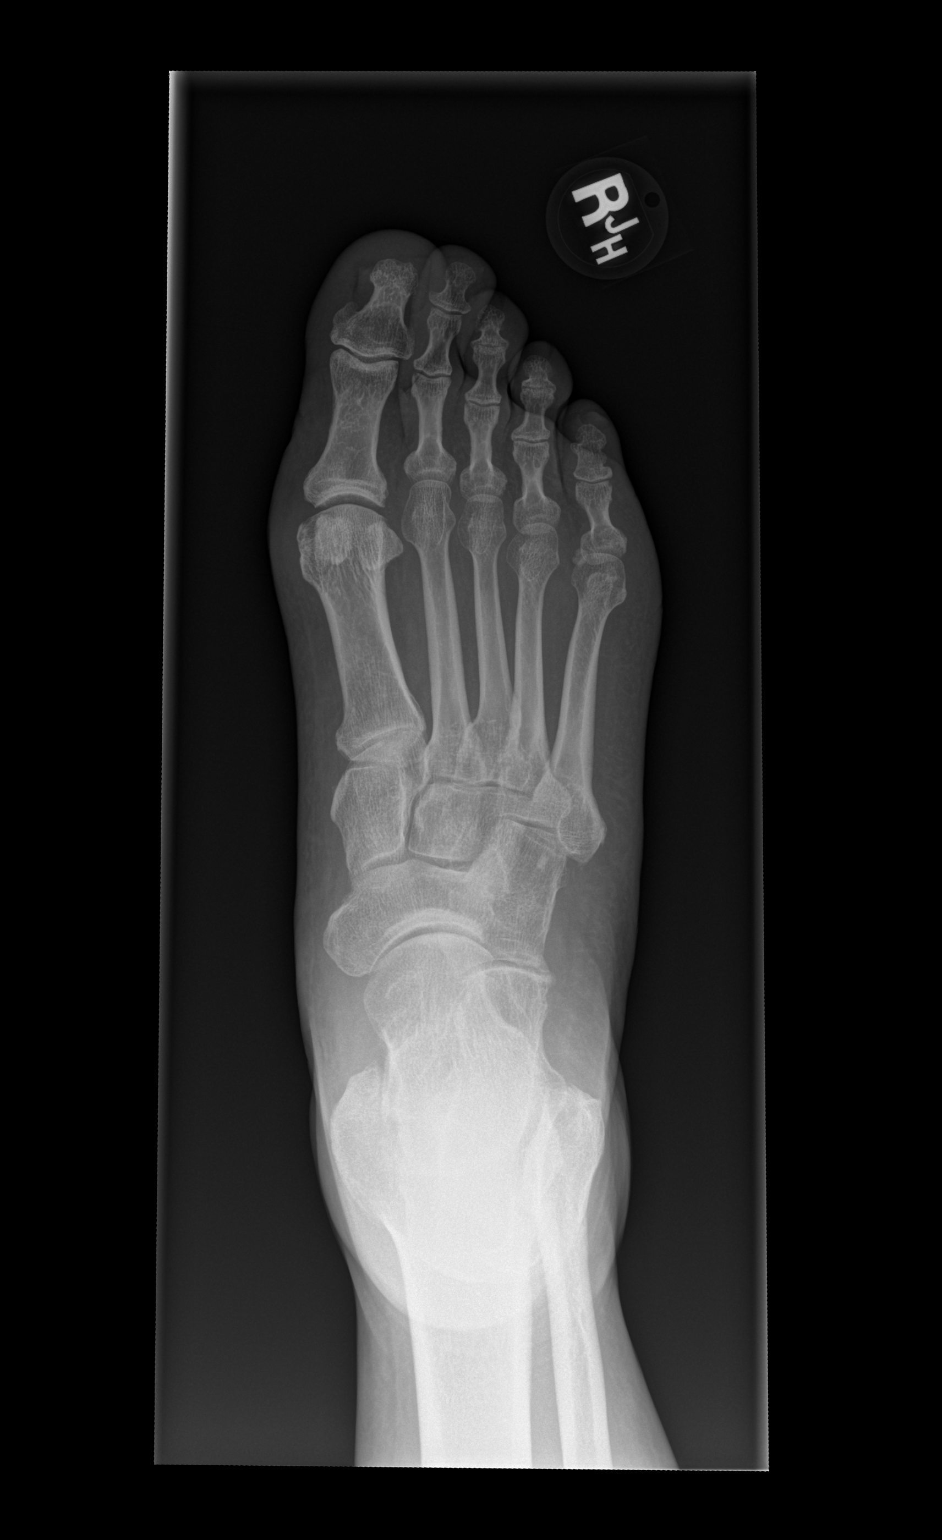

[x foot obl right]
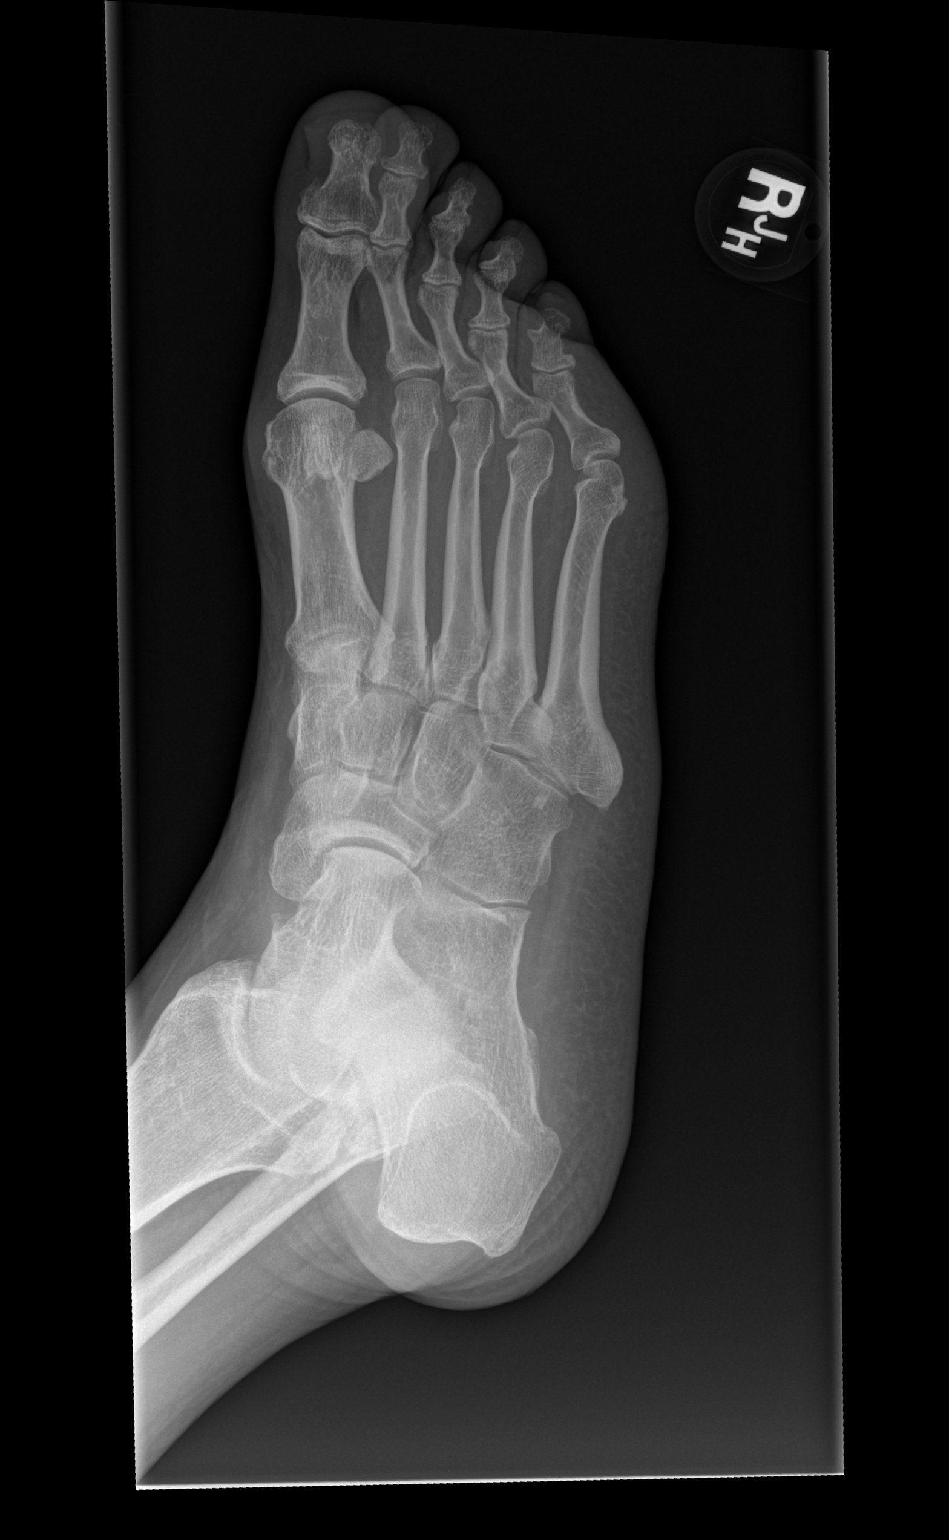

[x foot lat right]
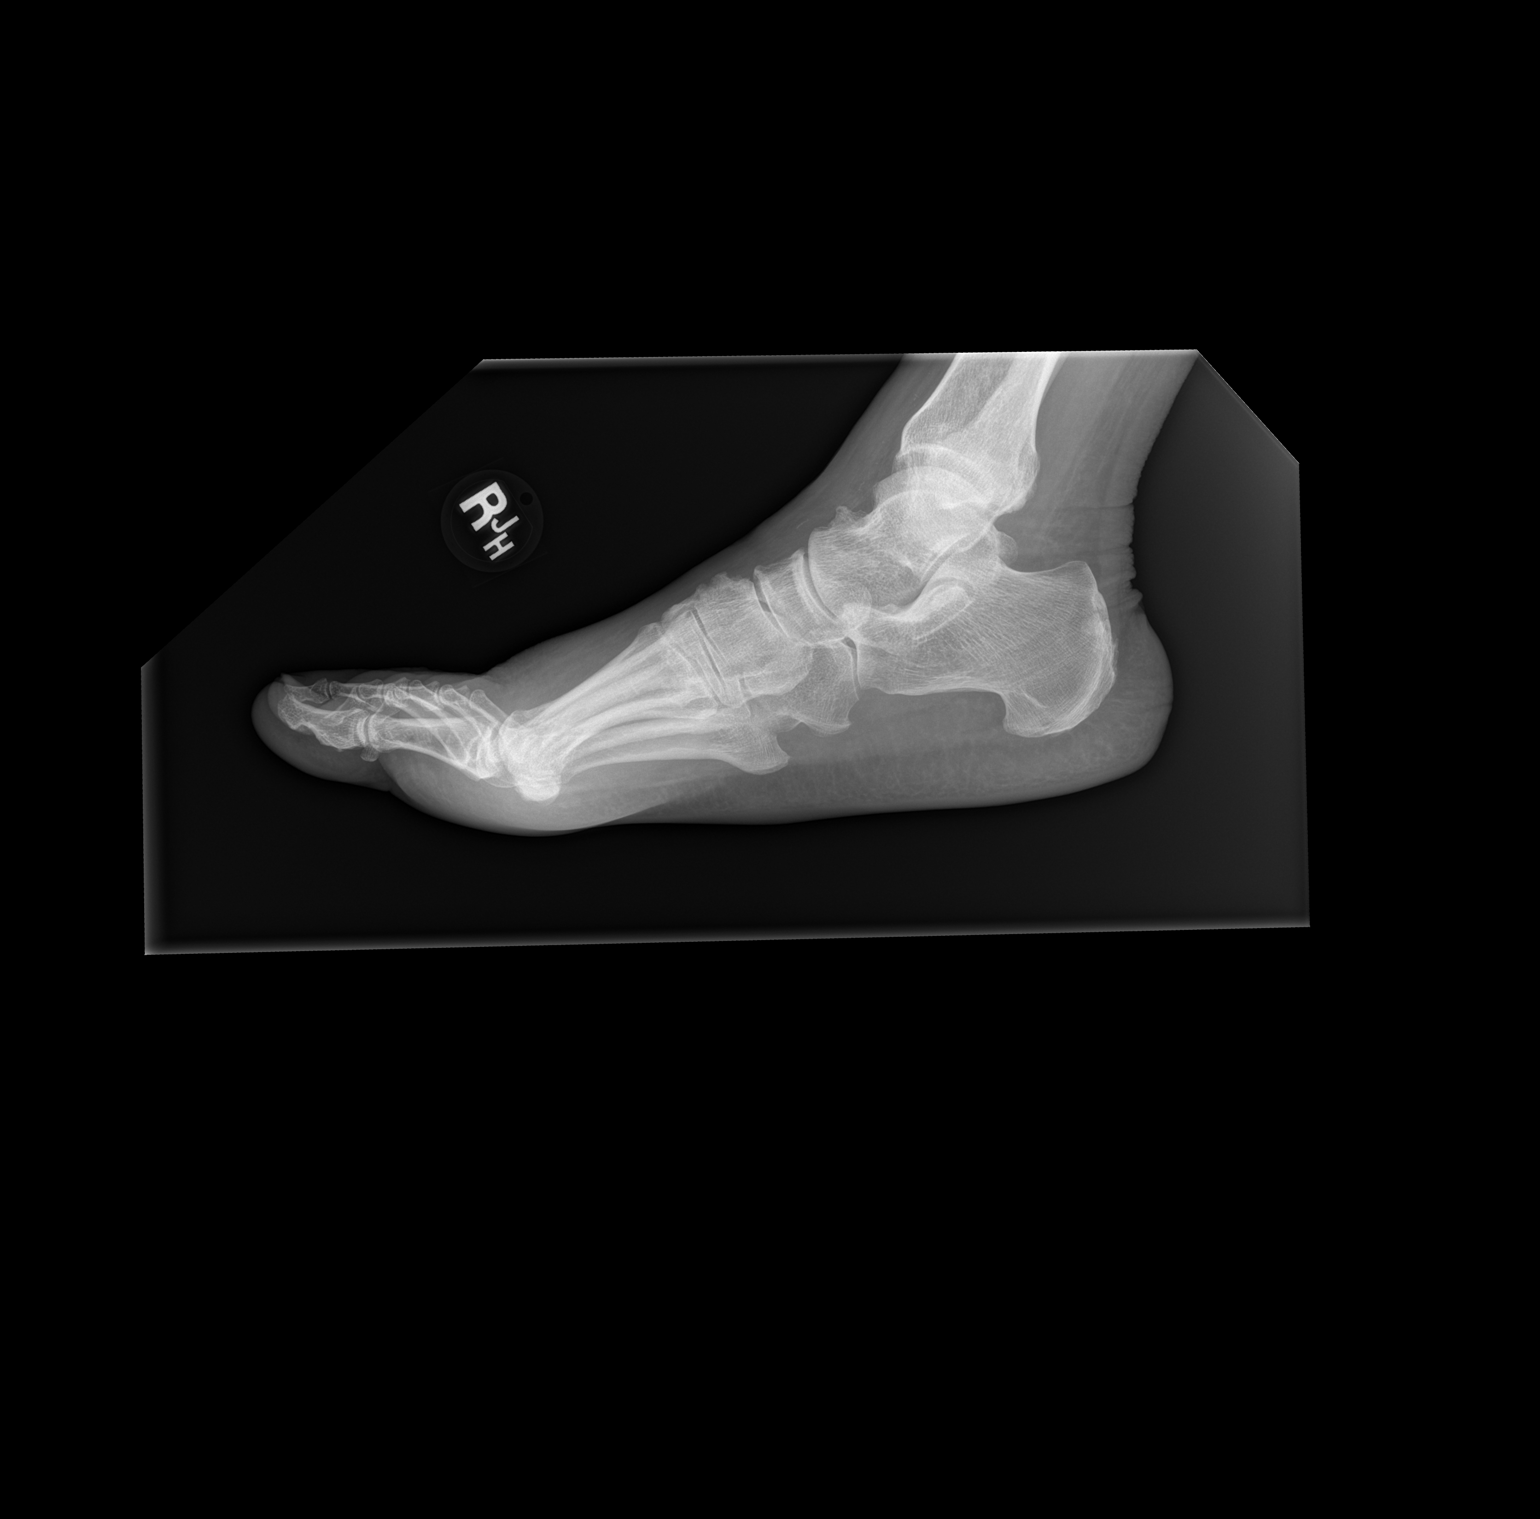

[3 of 3 positions shown; findings below may reference images not displayed]

FINDINGS: Three view radiograph right foot demonstrates normal alignment. No
fracture or dislocation. Mild midfoot degenerative arthritis.
Vascular calcifications are noted. There is moderate soft tissue
swelling of the right forefoot.
IMPRESSION: Soft tissue swelling.  No fracture or dislocation.

## 2022-05-08 NOTE — Progress Notes (Signed)
POST OPERATIVE OFFICE NOTE    CC:  F/u for surgery  HPI:  This is a 66 y.o. male who is s/p [facility] placement and  evacuation of left arm hematoma with direct repair of left brachiobasilic arteriovenous fistula on 05/01/22 by Dr. [name].  He was seen by Dr. Scot Dock in the ED with reports of the  Prevena dressing accidentally coming off.  He has a maintained Penrose drain.    Pt returns today for follow up.  Pt states he states the penrose drain feel out.  He denies drainage from the incision.  He has soreness in the arm and decreased ulnar nerve sensation in the forearm on the left.  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  He denies loss of motor or cold hand on the left.     No Known Allergies  Current Outpatient Medications  Medication Sig Dispense Refill   albuterol (VENTOLIN HFA) 108 (90 Base) MCG/ACT inhaler Inhale 2 puffs into the lungs every 6 (six) hours as needed for wheezing or shortness of breath.     amLODipine (NORVASC) 10 MG tablet Take 1 tablet (10 mg total) by mouth daily. 90 tablet 3   aspirin EC 81 MG tablet Take 81 mg by mouth daily.     diclofenac Sodium (VOLTAREN) 1 % GEL Apply 2 g topically 3 (three) times daily as needed (to affected areas for pain).     furosemide (LASIX) 20 MG tablet Take 10 mg by mouth daily as needed for fluid or edema.     hydrALAZINE (APRESOLINE) 25 MG tablet Take 2 tablets (50 mg total) by mouth every morning. (Patient taking differently: Take 25 mg by mouth 2 (two) times daily.) 180 tablet 3   ibuprofen (ADVIL) 800 MG tablet Take 800 mg by mouth every 8 (eight) hours as needed for mild pain.     isosorbide mononitrate (IMDUR) 30 MG 24 hr tablet Take 1 tablet (30 mg total) by mouth daily. 90 tablet 3   labetalol (NORMODYNE) 100 MG tablet Take 2 tablets (200 mg total) by mouth every morning. (Patient taking differently: Take 100 mg by mouth 2 (two) times daily.) 180 tablet 3   multivitamin (RENA-VIT) TABS tablet Take 1 tablet by  mouth daily.     Nicotine (NICODERM CQ TD) Place 1 patch onto the skin daily as needed (for smoking cessation). (Patient not taking: Reported on 04/30/2022)     Nutritional Supplements (ENSURE CLEAR) LIQD Take 1 Bottle by mouth in the morning and at bedtime.     oxyCODONE-acetaminophen (PERCOCET) 10-325 MG tablet Take 0.5 tablets by mouth every 6 (six) hours as needed for pain. 20 tablet 0   potassium chloride SA (KLOR-CON M) 20 MEQ tablet Take 2 tablets (40 mEq total) by mouth 2 (two) times daily. (Patient not taking: Reported on 04/30/2022) 4 tablet 0   rosuvastatin (CRESTOR) 40 MG tablet Take 40 mg by mouth every other day.     tamsulosin (FLOMAX) 0.4 MG CAPS capsule Take 0.8 mg by mouth in the morning.     No current facility-administered medications for this visit.     ROS:  See HPI  Physical Exam:     Incision:  healing well maintain staples, palpable thrill in fistula, no edema, no erythema  Extremities:  grip 5/5 equal Neuro: motor intact     Assessment/Plan:  This is a 66 y.o. male who is s/p:right TDC and hematoma evacuated from arm hematoma in left arm after traumatic  cannulation for dialysis.   I will have him return in 1-2 weeks for staple removal.     Roxy Horseman PA-C [facility] [phone]   Clinic MD:  Virl Cagey

## 2022-05-09 ENCOUNTER — Ambulatory Visit (INDEPENDENT_AMBULATORY_CARE_PROVIDER_SITE_OTHER): Payer: No Typology Code available for payment source | Admitting: Physician Assistant

## 2022-05-09 ENCOUNTER — Encounter: Payer: Self-pay | Admitting: Physician Assistant

## 2022-05-09 VITALS — BP 127/58 | HR 71 | Temp 98.2°F | Resp 18 | Ht 70.0 in | Wt 142.0 lb

## 2022-05-09 DIAGNOSIS — N186 End stage renal disease: Secondary | ICD-10-CM

## 2022-05-09 DIAGNOSIS — Z992 Dependence on renal dialysis: Secondary | ICD-10-CM

## 2022-05-15 NOTE — Progress Notes (Signed)
POST OPERATIVE OFFICE NOTE    CC:  F/u for surgery  HPI:  This is a 66 y.o. male who is s/p direct repair of left brachiobasilic AVF and TDC placement on 05/01/2022 by Dr. [name] for hematoma in left arm after traumatic cannulation for dialysis.     Pt was seen on 05/09/2022 and at that time, his penrose drain had fallen out.  He was not having drainage from his incision.  He was sore and had decreased ulnar nerve sensation in the forearm.  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 He was asked to return in a couple of weeks for staple removal and he comes in today for that visit.   Pt states he does  have pain/numbness in the left hand around the 2nd and 3rd fingers that have been present since having fistula placed.    The pt is on dialysis  T/T/S at [place] location.   No Known Allergies  Current Outpatient Medications  Medication Sig Dispense Refill   albuterol (VENTOLIN HFA) 108 (90 Base) MCG/ACT inhaler Inhale 2 puffs into the lungs every 6 (six) hours as needed for wheezing or shortness of breath.     amLODipine (NORVASC) 10 MG tablet Take 1 tablet (10 mg total) by mouth daily. 90 tablet 3   aspirin EC 81 MG tablet Take 81 mg by mouth daily.     diclofenac Sodium (VOLTAREN) 1 % GEL Apply 2 g topically 3 (three) times daily as needed (to affected areas for pain).     furosemide (LASIX) 20 MG tablet Take 10 mg by mouth daily as needed for fluid or edema.     hydrALAZINE (APRESOLINE) 25 MG tablet Take 2 tablets (50 mg total) by mouth every morning. (Patient taking differently: Take 25 mg by mouth 2 (two) times daily.) 180 tablet 3   ibuprofen (ADVIL) 800 MG tablet Take 800 mg by mouth every 8 (eight) hours as needed for mild pain.     isosorbide mononitrate (IMDUR) 30 MG 24 hr tablet Take 1 tablet (30 mg total) by mouth daily. 90 tablet 3   labetalol (NORMODYNE) 100 MG tablet Take 2 tablets (200 mg total) by mouth every morning. (Patient taking differently:  Take 100 mg by mouth 2 (two) times daily.) 180 tablet 3   multivitamin (RENA-VIT) TABS tablet Take 1 tablet by mouth daily.     Nicotine (NICODERM CQ TD) Place 1 patch onto the skin daily as needed (for smoking cessation). (Patient not taking: Reported on 05/09/2022)     Nutritional Supplements (ENSURE CLEAR) LIQD Take 1 Bottle by mouth in the morning and at bedtime.     oxyCODONE-acetaminophen (PERCOCET) 10-325 MG tablet Take 0.5 tablets by mouth every 6 (six) hours as needed for pain. (Patient not taking: Reported on 05/09/2022) 20 tablet 0   potassium chloride SA (KLOR-CON M) 20 MEQ tablet Take 2 tablets (40 mEq total) by mouth 2 (two) times daily. 4 tablet 0   rosuvastatin (CRESTOR) 40 MG tablet Take 40 mg by mouth every other day.     tamsulosin (FLOMAX) 0.4 MG CAPS capsule Take 0.8 mg by mouth in the morning.     No current facility-administered medications for this visit.     ROS:  See HPI  Physical Exam:  Today's Vitals   05/23/22 0832 05/23/22 0837  BP: (!) 180/84 (!) 187/79  Pulse: 71   Resp: 18   Temp: 98.5 F (36.9 C)   TempSrc:  Oral   SpO2: 96%   Weight: 148 lb 11.2 oz (67.4 kg)   Height: '5\' 10"'$  (1.778 m)    Body mass index is 21.34 kg/m.   Incision:  well healed Extremities:   There is a palpable left radial pulse.   Motor and sensory are in tact.   There is a thrill/bruit present.  Access is  easily palpable     Assessment/Plan:  This is a 66 y.o. male who is s/p: direct repair of left brachiobasilic AVF and TDC placement on 05/01/2022 by Dr. [name] for hematoma in left arm after traumatic cannulation for dialysis.     -the pt does have some forearm numbness and some numbness of the left 2nd and 3rd fingers but no non healing sores.  -pt's access below the incision can be used now.  Do not stick over incision for about 6 weeks.. -if pt has tunneled catheter, this can be removed at the discretion of the dialysis center once the pt's access has been  successfully cannulated to their satisfaction.  -discussed with pt that access does not last forever and will need intervention or even new access at some point.  -the pt will follow up as needed   Leontine Locket, [facility] [phone]  Clinic MD:  Virl Cagey

## 2022-05-23 ENCOUNTER — Ambulatory Visit (INDEPENDENT_AMBULATORY_CARE_PROVIDER_SITE_OTHER): Payer: No Typology Code available for payment source | Admitting: Physician Assistant

## 2022-05-23 VITALS — BP 187/79 | HR 71 | Temp 98.5°F | Resp 18 | Ht 70.0 in | Wt 148.7 lb

## 2022-05-23 DIAGNOSIS — N186 End stage renal disease: Secondary | ICD-10-CM

## 2022-05-23 DIAGNOSIS — Z992 Dependence on renal dialysis: Secondary | ICD-10-CM

## 2022-06-06 ENCOUNTER — Ambulatory Visit (INDEPENDENT_AMBULATORY_CARE_PROVIDER_SITE_OTHER): Payer: No Typology Code available for payment source | Admitting: Nurse Practitioner

## 2022-06-06 ENCOUNTER — Encounter: Payer: Self-pay | Admitting: Nurse Practitioner

## 2022-06-06 ENCOUNTER — Other Ambulatory Visit (INDEPENDENT_AMBULATORY_CARE_PROVIDER_SITE_OTHER): Payer: No Typology Code available for payment source

## 2022-06-06 VITALS — BP 146/64 | HR 85 | Ht 70.0 in | Wt 141.0 lb

## 2022-06-06 DIAGNOSIS — R112 Nausea with vomiting, unspecified: Secondary | ICD-10-CM | POA: Diagnosis not present

## 2022-06-06 DIAGNOSIS — N186 End stage renal disease: Secondary | ICD-10-CM

## 2022-06-06 DIAGNOSIS — Z992 Dependence on renal dialysis: Secondary | ICD-10-CM

## 2022-06-06 DIAGNOSIS — K219 Gastro-esophageal reflux disease without esophagitis: Secondary | ICD-10-CM | POA: Diagnosis not present

## 2022-06-06 DIAGNOSIS — D509 Iron deficiency anemia, unspecified: Secondary | ICD-10-CM | POA: Diagnosis not present

## 2022-06-06 LAB — IBC + FERRITIN
Ferritin: 299.6 ng/mL (ref 22.0–322.0)
Iron: 49 ug/dL (ref 42–165)
Saturation Ratios: 20.8 % (ref 20.0–50.0)
TIBC: 235.2 ug/dL — ABNORMAL LOW (ref 250.0–450.0)
Transferrin: 168 mg/dL — ABNORMAL LOW (ref 212.0–360.0)

## 2022-06-06 LAB — CBC WITH DIFFERENTIAL/PLATELET
Basophils Absolute: 0.1 10*3/uL (ref 0.0–0.1)
Basophils Relative: 1 % (ref 0.0–3.0)
Eosinophils Absolute: 0.4 10*3/uL (ref 0.0–0.7)
Eosinophils Relative: 5.6 % — ABNORMAL HIGH (ref 0.0–5.0)
HCT: 38.5 % — ABNORMAL LOW (ref 39.0–52.0)
Hemoglobin: 12.4 g/dL — ABNORMAL LOW (ref 13.0–17.0)
Lymphocytes Relative: 24.9 % (ref 12.0–46.0)
Lymphs Abs: 1.7 10*3/uL (ref 0.7–4.0)
MCHC: 32.2 g/dL (ref 30.0–36.0)
MCV: 90.1 fl (ref 78.0–100.0)
Monocytes Absolute: 1 10*3/uL (ref 0.1–1.0)
Monocytes Relative: 14.1 % — ABNORMAL HIGH (ref 3.0–12.0)
Neutro Abs: 3.7 10*3/uL (ref 1.4–7.7)
Neutrophils Relative %: 54.4 % (ref 43.0–77.0)
Platelets: 178 10*3/uL (ref 150.0–400.0)
RBC: 4.28 Mil/uL (ref 4.22–5.81)
RDW: 14.4 % (ref 11.5–15.5)
WBC: 6.8 10*3/uL (ref 4.0–10.5)

## 2022-06-06 LAB — COMPREHENSIVE METABOLIC PANEL
ALT: 25 U/L (ref 0–53)
AST: 22 U/L (ref 0–37)
Albumin: 3.8 g/dL (ref 3.5–5.2)
Alkaline Phosphatase: 128 U/L — ABNORMAL HIGH (ref 39–117)
BUN: 33 mg/dL — ABNORMAL HIGH (ref 6–23)
CO2: 32 mEq/L (ref 19–32)
Calcium: 8.6 mg/dL (ref 8.4–10.5)
Chloride: 100 mEq/L (ref 96–112)
Creatinine, Ser: 4.76 mg/dL (ref 0.40–1.50)
GFR: 12.22 mL/min — CL (ref >60.00)
Glucose, Bld: 89 mg/dL (ref 70–99)
Potassium: 3.7 mEq/L (ref 3.5–5.1)
Sodium: 140 mEq/L (ref 135–145)
Total Bilirubin: 0.3 mg/dL (ref 0.2–1.2)
Total Protein: 7.3 g/dL (ref 6.0–8.3)

## 2022-06-06 MED ORDER — OMEPRAZOLE 40 MG PO CPDR: 40.0000 mg | DELAYED_RELEASE_CAPSULE | Freq: Every day | ORAL | 2 refills | Status: DC

## 2022-06-06 NOTE — Progress Notes (Signed)
+    06/06/2022 Ronald Reeves [id number] October 06, 1955   CHIEF COMPLAINT: Acid reflux, vomiting and black stools  HISTORY OF PRESENT ILLNESS: Ronald Reeves is a 66 year old male with a past medical history of arthritis, hypertension, coronary artery disease (NM stress test showed past inferior MI), prediabetes, back pain, cocaine abuse, ESRD on hemodialysis every Tu/Th/Sat, chronic anemia and GERD. He presents to our office today as referred by his [place] provider to schedule a colonoscopy.  He is also concerned regarding increased reflux symptoms with recurrent nausea and vomiting.  He is a challenging historian.  He complains of having acid reflux for many years.  He stopped taking Zantac 2 to 3 years ago.  He endorses having active reflux symptoms 2 to 3 days weekly.  He has intermittent nausea and vomits up partially digested food once weekly.  He has frequent dry heaves in the morning.  He denies having any hematemesis or coffee-ground emesis.  He ate a steak biscuit earlier this morning and vomited it up.  He intermittently passes black solid or loose stool which comes and goes for day or 2 for the past several years.  He denies Pepto-Bismol use.  No oral iron supplement but he believes he receives IV iron intermittently during dialysis.  He denies receiving any blood transfusions during dialysis.  He underwent a colonoscopy about 10 years ago at the [place] which she reported showed a few polyps which were removed without evidence of colorectal cancer.  He lost 20 pounds over the past 6 months.  No fever, sweats or chills.  He takes aspirin 81 mg once daily.  Infrequent Ibuprofen use.      Latest Ref Rng & Units 05/01/2022    8:17 AM 04/30/2022   10:51 AM 02/02/2022    4:07 AM  CBC  WBC 4.0 - 10.5 K/uL  7.0  5.6   Hemoglobin 13.0 - 17.0 g/dL 8.8  8.3  11.3   Hematocrit 39.0 - 52.0 % 26.0  26.0  34.8   Platelets 150 - 400 K/uL  130  160        Latest Ref Rng & Units 05/01/2022    8:17 AM 04/30/2022   10:51  AM 02/02/2022    4:07 AM  CMP  Glucose 70 - 99 mg/dL 94  107  144   BUN 8 - 23 mg/dL 46  46  13   Creatinine 0.61 - 1.24 mg/dL 6.60  5.80  4.22   Sodium 135 - 145 mmol/L 139  141  139   Potassium 3.5 - 5.1 mmol/L 4.2  4.5  2.9   Chloride 98 - 111 mmol/L 106  106  99   CO2 22 - 32 mmol/L  24  32   Calcium 8.9 - 10.3 mg/dL  8.7  8.4   Total Protein 6.5 - 8.1 g/dL  6.5    Total Bilirubin 0.3 - 1.2 mg/dL  0.5    Alkaline Phos 38 - 126 U/L  68    AST 15 - 41 U/L  26    ALT 0 - 44 U/L  24      NM stress test 08/21/2017: 1. Large area of scarring in the inferior wall consistent with previous myocardial infarction. No definite reversible components to suggest pharmacologically induced myocardial ischemia.   2. Septal and inferior wall hypokinesis.   3. Left ventricular ejection fraction 38%   4. Non invasive risk stratification*: High  ECHO 08/21/2017: - Left ventricle: The cavity size was normal. There  was moderate    concentric hypertrophy. Systolic function was mildly reduced. The    estimated ejection fraction was in the range of 45% to 50%. There    is hypokinesis of the basal-midinferolateral and inferior    myocardium. The study is not technically sufficient to allow    evaluation of LV diastolic function.  - Aortic valve: There was mild regurgitation.  - Mitral valve: There was trivial regurgitation.  - Left atrium: The atrium was mildly dilated.  - Pulmonic valve: There was trivial regurgitation.   ECHO 07/15/2017: LV EF 60 - 65%   Past Medical History:  Diagnosis Date   Anemia    Arthritis    Back pain    Bronchitis    Chronic kidney disease    Coronary artery disease    GERD (gastroesophageal reflux disease)    not a current problem   Heart murmur    Hypertension    Myocardial infarction ([place])    Pre-diabetes    does not have CBG machine.   Substance abuse [facility])    Past Surgical History:  Procedure Laterality Date   AV FISTULA PLACEMENT Left 04/12/2021    Procedure: LEFT ARM ARTERIOVENOUS (AV) FISTULA CREATION;  Surgeon: Marty Heck, MD;  Location: [place];  Service: Vascular;  Laterality: Left;   AV FISTULA PLACEMENT Left 10/18/2021   Procedure: LEFT ARM BRACHIOBASILIC FISTULA CREATION FIRST STAGE;  Surgeon: Marty Heck, MD;  Location: [place];  Service: Vascular;  Laterality: Left;   [place] Left 01/27/2022   Procedure: LEFT SECOND STAGE BASILIC VEIN TRANSPOSITION;  Surgeon: Marty Heck, MD;  Location: [place];  Service: Vascular;  Laterality: Left;   INSERTION OF DIALYSIS CATHETER Right 05/01/2022   Procedure: INSERTION OF DIALYSIS CATHETER;  Surgeon: [name], MD;  Location: [facility];  Service: Vascular;  Laterality: Right;   LIGATION OF COMPETING BRANCHES OF ARTERIOVENOUS FISTULA Left 07/15/2021   Procedure: LIGATION OF COMPETING BRANCHES OF LEFT RADIOCEPHALIC ARTERIOVENOUS FISTULA TIMES TWO;  Surgeon: Marty Heck, MD;  Location: [place];  Service: Vascular;  Laterality: Left;   REVISON OF ARTERIOVENOUS FISTULA Left 07/15/2021   Procedure: REVISON OF LEFT ARTERIOVENOUS FISTULA;  Surgeon: Marty Heck, MD;  Location: [place];  Service: Vascular;  Laterality: Left;   REVISON OF ARTERIOVENOUS FISTULA Left 05/01/2022   Procedure: ARTERIOVENOUS FISTULA WASHOUT OF ARM HEMATOMA;  Surgeon: [name], MD;  Location: [facility];  Service: Vascular;  Laterality: Left;   Social History: He is married.  He has 1 son and 3 daughters.  He is retired.  He smokes 2 cigars daily.  He drinks 32 ounces of beer daily.  He smokes marijuana daily for many years.  He last used cocaine 2 months ago.  Family History: No known family history of esophageal, gastric or colon cancer.  Mother with history of heart disease.  Sister with history of heart disease/MI.  No Known Allergies   Outpatient Encounter Medications as of 06/06/2022  Medication Sig   albuterol (VENTOLIN HFA) 108 (90 Base) MCG/ACT  inhaler Inhale 2 puffs into the lungs every 6 (six) hours as needed for wheezing or shortness of breath.   amLODipine (NORVASC) 10 MG tablet Take 1 tablet (10 mg total) by mouth daily.   aspirin EC 81 MG tablet Take 81 mg by mouth daily.   diclofenac Sodium (VOLTAREN) 1 % GEL Apply 2 g topically 3 (three) times daily as needed (to  affected areas for pain).   furosemide (LASIX) 20 MG tablet Take 10 mg by mouth daily as needed for fluid or edema.   hydrALAZINE (APRESOLINE) 25 MG tablet Take 2 tablets (50 mg total) by mouth every morning. (Patient taking differently: Take 25 mg by mouth 2 (two) times daily.)   ibuprofen (ADVIL) 800 MG tablet Take 800 mg by mouth every 8 (eight) hours as needed for mild pain.   isosorbide mononitrate (IMDUR) 30 MG 24 hr tablet Take 1 tablet (30 mg total) by mouth daily.   labetalol (NORMODYNE) 100 MG tablet Take 2 tablets (200 mg total) by mouth every morning. (Patient taking differently: Take 100 mg by mouth 2 (two) times daily.)   multivitamin (RENA-VIT) TABS tablet Take 1 tablet by mouth daily.   Nicotine (NICODERM CQ TD) Place 1 patch onto the skin daily as needed (for smoking cessation). (Patient not taking: Reported on 05/09/2022)   Nutritional Supplements (ENSURE CLEAR) LIQD Take 1 Bottle by mouth in the morning and at bedtime.   oxyCODONE-acetaminophen (PERCOCET) 10-325 MG tablet Take 0.5 tablets by mouth every 6 (six) hours as needed for pain. (Patient not taking: Reported on 05/09/2022)   potassium chloride SA (KLOR-CON M) 20 MEQ tablet Take 2 tablets (40 mEq total) by mouth 2 (two) times daily.   rosuvastatin (CRESTOR) 40 MG tablet Take 40 mg by mouth every other day.   tamsulosin (FLOMAX) 0.4 MG CAPS capsule Take 0.8 mg by mouth in the morning.   No facility-administered encounter medications on file as of 06/06/2022.    REVIEW OF SYSTEMS:  Gen: Denies fever, sweats or chills. + 20 lb weight loss past 6 months.  CV: Denies chest pain, palpitations or  edema. Resp: Denies cough, shortness of breath of hemoptysis.  GI: See HPI.   GU : ESRD on hemodialysis. MS: Denies joint pain, muscles aches or weakness. Derm: Denies rash, itchiness, skin lesions or unhealing ulcers. Psych: Denies depression, anxiety or significant memory loss. Heme: Denies bruising, easy bleeding. Neuro:  Denies headaches, dizziness or paresthesias. Endo:  Denies any problems with DM, thyroid or adrenal function.  PHYSICAL EXAM: BP (!) 146/64   Pulse 85   Ht '5\' 10"'$  (1.778 m)   Wt 141 lb (64 kg)   BMI 20.23 kg/m   General: 66 year old male in no acute distress. Head: Normocephalic and atraumatic. Eyes:  Sclerae non-icteric, conjunctive pink. Ears: Normal auditory acuity. Mouth: Dentition intact. No ulcers or lesions.  Neck: Supple, no lymphadenopathy or thyromegaly.  Lungs: Clear bilaterally to auscultation without wheezes, crackles or rhonchi. Heart: Regular rate and rhythm. No murmur, rub or gallop appreciated.  Abdomen: Soft, nontender, non distended. No masses. No hepatosplenomegaly. Normoactive bowel sounds x 4 quadrants.  Rectal: Deferred. Musculoskeletal: Symmetrical with no gross deformities. Skin: Warm and dry. No rash or lesions on visible extremities. Extremities: No edema.  Left upper extremity AV fistula with positive bruit and thrill. Neurological: Alert oriented x 4, no focal deficits.  Psychological:  Alert and cooperative. Normal mood and affect.  ASSESSMENT AND PLAN:  66) 66 year old male with GERD symptoms, intermittent black stools concerning for upper GI bleeding. -GERD diet reviewed -Omeprazole 40 mg once daily -EGD at [facility] benefits and risks discussed including risk with sedation, risk of bleeding, perforation and infection  -Patient instructed to go to the emergency room if he develops frequent loose black stools  2) Chronic nausea and vomiting -See plan in #1  3) Chronic anemia secondary to ESRD and possible chronic GI blood  loss.   -EGD and colonoscopy at [facility], timing to be determined by Dr. Bryan Lemma.  -Note, patient has dialysis on Tu/Th/Sat. EGD and colonoscopy to be scheduled on a nondialysis day -CBC, CMP, IBC + ferritin level   4) ESRD on HD  5) Coronary artery disease, NM stress test in 2019 showed evidence of a past inferior MI.  No chest pain, palpitations, dizziness or shortness of breath.  No longer followed by cardiology.  6) Unintentional 20 pound weight loss, likely due to upper GI symptoms as noted above -Recommend CTAP if EGD and colonoscopy unrevealing  7) Alcohol use disorder -Recommended complete alcohol cessation  8) History of cocaine abuse, last used cocaine approximately 2 months ago -Commended complete cocaine cessation  9) History of colon polyps.  Patient reported undergoing a colonoscopy at the [place] 10 years ago and a few polyps were removed from the colon. -Colonoscopy as ordered above       CC:  Gerome Sam*

## 2022-06-06 NOTE — Patient Instructions (Addendum)
Your provider has requested that you go to the basement level for lab work before leaving today. Press "B" on the elevator. The lab is located at the first door on the left as you exit the elevator.   We have sent the following medications to your pharmacy for you to pick up at your convenience: Omeprazole 40 mg 1 by mouth daily  Go to the ER if you develop frequent loose black stools or severe abdominal pain.  Recommends no alcohol or drug use.   Due to recent changes in healthcare laws, you may see the results of your imaging and laboratory studies on MyChart before your provider has had a chance to review them.  We understand that in some cases there may be results that are confusing or concerning to you. Not all laboratory results come back in the same time frame and the provider may be waiting for multiple results in order to interpret others.  Please give [place] 48 hours in order for your provider to thoroughly review all the results before contacting the office for clarification of your results.    It was a pleasure to see you today!  Thank you for trusting me with your gastrointestinal care!

## 2022-06-11 NOTE — Progress Notes (Signed)
Agree with the assessment and plan as outlined by Carl Best, NP.   Agree with proceeding with EGD/colonoscopy at [facility] due to elevated periprocedural risks from his underlying comorbidities, to include ESRD on HD.  Repeat CBC reviewed and uptrending with now normal iron indices.  Normal liver enzymes aside from elevated alkaline phosphatase, commonly attributed to ESRD.  Plan for EGD/colonoscopy for evaluation of multiple GI symptoms, colon polyp surveillance, and potentially therapeutic intent with history of IDA in the past.   Gerrit Heck, DO, [facility]

## 2022-06-11 NOTE — Progress Notes (Signed)
Remo Lipps, pls contact patient to schedule EGD and colonoscopy at [facility] with Dr. Bryan Lemma. Refer to Dr. Vivia Ewing addendum. THX.

## 2022-06-16 ENCOUNTER — Other Ambulatory Visit: Payer: Self-pay

## 2022-06-16 DIAGNOSIS — K219 Gastro-esophageal reflux disease without esophagitis: Secondary | ICD-10-CM

## 2022-06-16 DIAGNOSIS — D509 Iron deficiency anemia, unspecified: Secondary | ICD-10-CM

## 2022-06-16 DIAGNOSIS — R112 Nausea with vomiting, unspecified: Secondary | ICD-10-CM

## 2022-06-17 ENCOUNTER — Emergency Department (HOSPITAL_BASED_OUTPATIENT_CLINIC_OR_DEPARTMENT_OTHER): Payer: No Typology Code available for payment source | Admitting: Radiology

## 2022-06-17 ENCOUNTER — Encounter (HOSPITAL_BASED_OUTPATIENT_CLINIC_OR_DEPARTMENT_OTHER): Payer: Self-pay | Admitting: Emergency Medicine

## 2022-06-17 ENCOUNTER — Emergency Department (HOSPITAL_BASED_OUTPATIENT_CLINIC_OR_DEPARTMENT_OTHER)
Admission: EM | Admit: 2022-06-17 | Discharge: 2022-06-17 | Discharge status: Against Medical Advice | Payer: No Typology Code available for payment source | Attending: Emergency Medicine | Admitting: Emergency Medicine

## 2022-06-17 ENCOUNTER — Encounter (HOSPITAL_COMMUNITY): Payer: Self-pay

## 2022-06-17 DIAGNOSIS — N186 End stage renal disease: Secondary | ICD-10-CM | POA: Insufficient documentation

## 2022-06-17 DIAGNOSIS — Z992 Dependence on renal dialysis: Secondary | ICD-10-CM | POA: Insufficient documentation

## 2022-06-17 DIAGNOSIS — Z79899 Other long term (current) drug therapy: Secondary | ICD-10-CM | POA: Diagnosis not present

## 2022-06-17 DIAGNOSIS — R531 Weakness: Secondary | ICD-10-CM

## 2022-06-17 DIAGNOSIS — Z7982 Long term (current) use of aspirin: Secondary | ICD-10-CM | POA: Insufficient documentation

## 2022-06-17 DIAGNOSIS — R627 Adult failure to thrive: Secondary | ICD-10-CM | POA: Diagnosis not present

## 2022-06-17 DIAGNOSIS — I120 Hypertensive chronic kidney disease with stage 5 chronic kidney disease or end stage renal disease: Secondary | ICD-10-CM | POA: Insufficient documentation

## 2022-06-17 DIAGNOSIS — R0602 Shortness of breath: Secondary | ICD-10-CM

## 2022-06-17 DIAGNOSIS — I2510 Atherosclerotic heart disease of native coronary artery without angina pectoris: Secondary | ICD-10-CM | POA: Diagnosis not present

## 2022-06-17 DIAGNOSIS — F17290 Nicotine dependence, other tobacco product, uncomplicated: Secondary | ICD-10-CM | POA: Insufficient documentation

## 2022-06-17 DIAGNOSIS — F1410 Cocaine abuse, uncomplicated: Secondary | ICD-10-CM | POA: Diagnosis not present

## 2022-06-17 LAB — CBC WITH DIFFERENTIAL/PLATELET
Abs Immature Granulocytes: 0.01 10*3/uL (ref 0.00–0.07)
Basophils Absolute: 0.1 10*3/uL (ref 0.0–0.1)
Basophils Relative: 1 %
Eosinophils Absolute: 0.3 10*3/uL (ref 0.0–0.5)
Eosinophils Relative: 4 %
HCT: 38.6 % — ABNORMAL LOW (ref 39.0–52.0)
Hemoglobin: 12.1 g/dL — ABNORMAL LOW (ref 13.0–17.0)
Immature Granulocytes: 0 %
Lymphocytes Relative: 17 %
Lymphs Abs: 1.1 10*3/uL (ref 0.7–4.0)
MCH: 28.6 pg (ref 26.0–34.0)
MCHC: 31.3 g/dL (ref 30.0–36.0)
MCV: 91.3 fL (ref 80.0–100.0)
Monocytes Absolute: 0.5 10*3/uL (ref 0.1–1.0)
Monocytes Relative: 7 %
Neutro Abs: 4.8 10*3/uL (ref 1.7–7.7)
Neutrophils Relative %: 71 %
Platelets: 145 10*3/uL — ABNORMAL LOW (ref 150–400)
RBC: 4.23 MIL/uL (ref 4.22–5.81)
RDW: 14.9 % (ref 11.5–15.5)
WBC: 6.8 10*3/uL (ref 4.0–10.5)
nRBC: 0 % (ref 0.0–0.2)

## 2022-06-17 LAB — BASIC METABOLIC PANEL
Anion gap: 15 (ref 5–15)
BUN: 58 mg/dL — ABNORMAL HIGH (ref 8–23)
CO2: 24 mmol/L (ref 22–32)
Calcium: 8.3 mg/dL — ABNORMAL LOW (ref 8.9–10.3)
Chloride: 104 mmol/L (ref 98–111)
Creatinine, Ser: 6.32 mg/dL — ABNORMAL HIGH (ref 0.61–1.24)
GFR, Estimated: 9 mL/min — ABNORMAL LOW (ref >60)
Glucose, Bld: 108 mg/dL — ABNORMAL HIGH (ref 70–99)
Potassium: 4.3 mmol/L (ref 3.5–5.1)
Sodium: 143 mmol/L (ref 135–145)

## 2022-06-17 LAB — RAPID URINE DRUG SCREEN, HOSP PERFORMED
Amphetamines: NOT DETECTED
Barbiturates: NOT DETECTED
Benzodiazepines: NOT DETECTED
Cocaine: POSITIVE — AB
Opiates: NOT DETECTED
Tetrahydrocannabinol: NOT DETECTED

## 2022-06-17 MED ORDER — LABETALOL HCL 100 MG PO TABS
100.0000 mg | ORAL_TABLET | Freq: Two times a day (BID) | ORAL | Status: DC
  Administered 2022-06-17: 100 mg via ORAL
  Filled 2022-06-17: qty 1

## 2022-06-17 MED ORDER — ASPIRIN 81 MG PO TBEC
81.0000 mg | DELAYED_RELEASE_TABLET | Freq: Every day | ORAL | Status: DC
  Administered 2022-06-17: 81 mg via ORAL
  Filled 2022-06-17: qty 1

## 2022-06-17 MED ORDER — ISOSORBIDE MONONITRATE ER 30 MG PO TB24: 30.0000 mg | ORAL_TABLET | Freq: Every day | ORAL | Status: DC

## 2022-06-17 MED ORDER — ALBUTEROL SULFATE HFA 108 (90 BASE) MCG/ACT IN AERS: 2.0000 | INHALATION_SPRAY | Freq: Four times a day (QID) | RESPIRATORY_TRACT | Status: DC | PRN

## 2022-06-17 MED ORDER — ALBUTEROL SULFATE (2.5 MG/3ML) 0.083% IN NEBU: 2.5000 mg | INHALATION_SOLUTION | Freq: Four times a day (QID) | RESPIRATORY_TRACT | Status: DC | PRN

## 2022-06-17 MED ORDER — AMLODIPINE BESYLATE 5 MG PO TABS
10.0000 mg | ORAL_TABLET | Freq: Every day | ORAL | Status: DC
  Administered 2022-06-17: 10 mg via ORAL
  Filled 2022-06-17: qty 2

## 2022-06-17 MED ORDER — HYDRALAZINE HCL 25 MG PO TABS
25.0000 mg | ORAL_TABLET | Freq: Two times a day (BID) | ORAL | Status: DC
  Administered 2022-06-17: 25 mg via ORAL
  Filled 2022-06-17: qty 1

## 2022-06-17 NOTE — ED Triage Notes (Addendum)
Pt presents for SOB, worse at night, for the past month. Receives HD T-Th-Sat and states that he has been requiring oxygen intermittently during treatments. Has not missed any HD treatments. States that he is not able to walk due to shortness of breath and lightheadedness Denies swelling, fever, CP.   Has an inhaler but has not helped. Last cocaine use today.

## 2022-06-17 NOTE — ED Notes (Signed)
Pt still having some shortness of breath. Adamant about leaving for his 12:00 Noon dialysis appointment. MD and I both spoke with patient and wife regarding this not being safe to discharge. Pt aware and signed out AMA. IV removed and pt tolerated well.

## 2022-06-17 NOTE — ED Notes (Signed)
pt visitor out to the nurses station reporting patient nauseated however states he is nauseous d/t needing to eat -- Per Dr. Florina Ou patient is ok to eat and drink; pt requesting coffee and frozen tv dinner (chicken with mashed potatoes)

## 2022-06-17 NOTE — ED Notes (Signed)
Pt and significant other asking about admission. Discussed once again to patient reason for being admitted and the process. Also, explained to patient he would be transported via [facility] d/t oxygen requirements and monitoring. MD spoke with them this morning as well.

## 2022-06-17 NOTE — ED Provider Notes (Addendum)
[facility] Provider Note: Georgena Spurling, MD, FACEP  CSN: [id number] MRN: [id number] ARRIVAL: 06/17/22 at [place]: [id number]   CHIEF COMPLAINT  Shortness of Breath   HISTORY OF PRESENT ILLNESS  06/17/22 5:40 AM Ronald Reeves is a 66 y.o. male with end-stage renal disease on hemodialysis Tuesday, Thursday and Saturday.  He is here with shortness of breath, worse at night, for the past month.  It is also worse when he ambulates.  The shortness of breath and now lightheadedness have made it difficult for him to perform activities of daily living.  The symptoms have worsened over the past 2 days.  He has not gotten relief from his albuterol inhaler.  Has had a poor appetite and poor oral intake.  He has had generalized weakness.  He denies chest pain.  He has not had a fever.  He admits to recent cocaine use.  His oxygen saturation was 94% on room air on arrival but was noted to drop into the 80s with exertion.   Past Medical History:  Diagnosis Date   Anemia    Arthritis    Back pain    Bronchitis    Chronic kidney disease    Coronary artery disease    GERD (gastroesophageal reflux disease)    not a current problem   Heart murmur    Hypertension    Myocardial infarction ([place])    Pre-diabetes    does not have CBG machine.   Substance abuse [facility])     Past Surgical History:  Procedure Laterality Date   AV FISTULA PLACEMENT Left 04/12/2021   Procedure: LEFT ARM ARTERIOVENOUS (AV) FISTULA CREATION;  Surgeon: Marty Heck, MD;  Location: [place];  Service: Vascular;  Laterality: Left;   AV FISTULA PLACEMENT Left 10/18/2021   Procedure: LEFT ARM BRACHIOBASILIC FISTULA CREATION FIRST STAGE;  Surgeon: Marty Heck, MD;  Location: [place];  Service: Vascular;  Laterality: Left;   [place] Left 01/27/2022   Procedure: LEFT SECOND STAGE BASILIC VEIN TRANSPOSITION;  Surgeon: Marty Heck, MD;  Location: [place];  Service:  Vascular;  Laterality: Left;   INSERTION OF DIALYSIS CATHETER Right 05/01/2022   Procedure: INSERTION OF DIALYSIS CATHETER;  Surgeon: [name], MD;  Location[facility];  Service: Vascular;  Laterality: Right;   LIGATION OF COMPETING BRANCHES OF ARTERIOVENOUS FISTULA Left 07/15/2021   Procedure: LIGATION OF COMPETING BRANCHES OF LEFT RADIOCEPHALIC ARTERIOVENOUS FISTULA TIMES TWO;  Surgeon: Marty Heck, MD;  Location: [place];  Service: Vascular;  Laterality: Left;   REVISON OF ARTERIOVENOUS FISTULA Left 07/15/2021   Procedure: REVISON OF LEFT ARTERIOVENOUS FISTULA;  Surgeon: Marty Heck, MD;  Location: [place];  Service: Vascular;  Laterality: Left;   REVISON OF ARTERIOVENOUS FISTULA Left 05/01/2022   Procedure: ARTERIOVENOUS FISTULA WASHOUT OF ARM HEMATOMA;  Surgeon: [name], MD;  Location: [facility];  Service: Vascular;  Laterality: Left;    Family History  Problem Relation Age of Onset   Heart disease Mother    Heart attack Sister 40    Social History   Tobacco Use   Smoking status: Every Day    Types: Cigars   Smokeless tobacco: Current   Tobacco comments:    3 black and mild a day  Vaping Use   Vaping Use: Never used  Substance Use Topics   Alcohol use: Yes    Alcohol/week: 14.0 standard drinks of alcohol    Types:  14 Cans of beer per week    Comment: Daily   Drug use: Yes    Frequency: 1.0 times per week    Types: Marijuana, Cocaine    Comment: marijuana daily    Prior to Admission medications   Medication Sig Start Date End Date Taking? Authorizing Provider  albuterol (VENTOLIN HFA) 108 (90 Base) MCG/ACT inhaler Inhale 2 puffs into the lungs every 6 (six) hours as needed for wheezing or shortness of breath.    [provider]  amLODipine (NORVASC) 10 MG tablet Take 1 tablet (10 mg total) by mouth daily. 01/01/18   Lelon Perla, MD  aspirin EC 81 MG tablet Take 81 mg by mouth daily.    [provider]  diclofenac Sodium  (VOLTAREN) 1 % GEL Apply 2 g topically 3 (three) times daily as needed (to affected areas for pain).    [provider]  furosemide (LASIX) 20 MG tablet Take 10 mg by mouth daily as needed for fluid or edema. 11/14/19   [provider]  hydrALAZINE (APRESOLINE) 25 MG tablet Take 2 tablets (50 mg total) by mouth every morning. Patient taking differently: Take 25 mg by mouth 2 (two) times daily. 01/01/18   Lelon Perla, MD  ibuprofen (ADVIL) 800 MG tablet Take 800 mg by mouth every 8 (eight) hours as needed for mild pain.    [provider]  isosorbide mononitrate (IMDUR) 30 MG 24 hr tablet Take 1 tablet (30 mg total) by mouth daily. 01/01/18   Lelon Perla, MD  labetalol (NORMODYNE) 100 MG tablet Take 2 tablets (200 mg total) by mouth every morning. Patient taking differently: Take 100 mg by mouth 2 (two) times daily. 01/01/18   Lelon Perla, MD  multivitamin (RENA-VIT) TABS tablet Take 1 tablet by mouth daily. 11/12/21   [provider]  Nutritional Supplements (ENSURE CLEAR) LIQD Take 1 Bottle by mouth in the morning and at bedtime.    [provider]  omeprazole (PRILOSEC) 40 MG capsule Take 1 capsule (40 mg total) by mouth daily. 06/06/22   Noralyn Pick, NP  rosuvastatin (CRESTOR) 40 MG tablet Take 40 mg by mouth every other day. 02/04/21   [provider]  tamsulosin (FLOMAX) 0.4 MG CAPS capsule Take 0.8 mg by mouth in the morning.    [provider]    Allergies Patient has no known allergies.   REVIEW OF SYSTEMS  Negative except as noted here or in the History of Present Illness.   PHYSICAL EXAMINATION  Initial Vital Signs Blood pressure (!) 197/92, pulse 88, temperature 98 F (36.7 C), resp. rate (!) 24, weight 68 kg, SpO2 94 %.  Examination General: Well-developed, well-nourished male in no acute distress; appearance consistent with age of record HENT: normocephalic; atraumatic Eyes: pupils equal,  round and reactive to light; extraocular muscles intact; arcus senilis bilaterally Neck: supple Heart: regular rate and rhythm Lungs: Rales throughout; tachypnea Abdomen: soft; nondistended; nontender; bowel sounds present Extremities: No deformity; full range of motion; pulses normal Neurologic: Awake but drowsy; motor function intact in all extremities and symmetric; no facial droop Skin: Warm and dry Psychiatric: Flat affect   RESULTS  Summary of this visit's results, reviewed and interpreted by myself:   EKG Interpretation  Date/Time:  Tuesday June 17 2022 04:52:29 EST Ventricular Rate:  81 PR Interval:  186 QRS Duration: 94 QT Interval:  444 QTC Calculation: 515 R Axis:   80 Text Interpretation: Sinus rhythm with frequent Premature  ventricular complexes Biatrial enlargement Left ventricular hypertrophy Anteroseptal infarct (cited on or before 21-Aug-2017) ST & T wave abnormality, consider lateral ischemia Prolonged QT Abnormal ECG When compared with ECG of 18-Oct-2021 07:30, Premature ventricular complexes are now Present Confirmed by Shanon Rosser [id number]) on 06/17/2022 4:59:20 AM       Laboratory Studies: Results for orders placed or performed during the hospital encounter of 06/17/22 (from the past 24 hour(s))  CBC with Differential     Status: Abnormal   Collection Time: 06/17/22  5:28 AM  Result Value Ref Range   WBC 6.8 4.0 - 10.5 K/uL   RBC 4.23 4.22 - 5.81 MIL/uL   Hemoglobin 12.1 (L) 13.0 - 17.0 g/dL   HCT 38.6 (L) 39.0 - 52.0 %   MCV 91.3 80.0 - 100.0 fL   MCH 28.6 26.0 - 34.0 pg   MCHC 31.3 30.0 - 36.0 g/dL   RDW 14.9 11.5 - 15.5 %   Platelets 145 (L) 150 - 400 K/uL   nRBC 0.0 0.0 - 0.2 %   Neutrophils Relative % 71 %   Neutro Abs 4.8 1.7 - 7.7 K/uL   Lymphocytes Relative 17 %   Lymphs Abs 1.1 0.7 - 4.0 K/uL   Monocytes Relative 7 %   Monocytes Absolute 0.5 0.1 - 1.0 K/uL   Eosinophils Relative 4 %   Eosinophils Absolute 0.3 0.0 - 0.5 K/uL    Basophils Relative 1 %   Basophils Absolute 0.1 0.0 - 0.1 K/uL   Immature Granulocytes 0 %   Abs Immature Granulocytes 0.01 0.00 - 0.07 K/uL  Basic metabolic panel     Status: Abnormal   Collection Time: 06/17/22  5:28 AM  Result Value Ref Range   Sodium 143 135 - 145 mmol/L   Potassium 4.3 3.5 - 5.1 mmol/L   Chloride 104 98 - 111 mmol/L   CO2 24 22 - 32 mmol/L   Glucose, Bld 108 (H) 70 - 99 mg/dL   BUN 58 (H) 8 - 23 mg/dL   Creatinine, Ser 6.32 (H) 0.61 - 1.24 mg/dL   Calcium 8.3 (L) 8.9 - 10.3 mg/dL   GFR, Estimated 9 (L) >60 mL/min   Anion gap 15 5 - 15   Imaging Studies: DG Chest 2 View  Result Date: 06/17/2022 CLINICAL DATA:  66 year old male with shortness of breath. Dialysis patient. EXAM: CHEST - 2 VIEW COMPARISON:  Portable chest 05/01/2022 and earlier. FINDINGS: PA and lateral views at 0503 hours. Stable right chest dual lumen dialysis catheter. Mild-to-moderate cardiomegaly is increased since 2019, cardiac silhouette stable since September. Increased and fairly symmetric bilateral pulmonary interstitial opacity since September, with indistinct pulmonary vasculature. Probable small or trace layering pleural effusions with trace fluid in the bilateral pleural fissures. No pneumothorax or consolidation. Stable visualized osseous structures. Partially visible lumbar fusion hardware. Negative visible bowel gas. IMPRESSION: 1. Evidence of acute pulmonary edema with small or trace pleural effusions. 2. Mild to moderate cardiomegaly, stable since September. And stable right chest dialysis catheter. Electronically Signed   By: Genevie Ann M.D.   On: 06/17/2022 05:16    ED COURSE and MDM  Nursing notes, initial and subsequent vitals signs, including pulse oximetry, reviewed and interpreted by myself.  Vitals:   06/17/22 0443 06/17/22 0444 06/17/22 0600  BP:  (!) 197/92 (!) 182/92  Pulse:  88 82  Resp:  (!) 24 (!) 21  Temp:  98 F (36.7 C)   SpO2:  94% 97%  Weight: 68 kg  Medications - No data to display  6:03 AM We will have patient admitted to the hospitalist service for essentially failure to thrive.  He has had progressive dyspnea and weakness over the past month.  He is a [place] patient and was seen at the [place] recently and they told him that if he worsened he should go to the nearest emergency department.  6:37 AM Dr. Nevada Crane accepts for admission to hospitalist service.  PROCEDURES  Procedures   ED DIAGNOSES     ICD-10-CM   1. Shortness of breath  R06.02     2. Generalized weakness  R53.1     3. Cocaine abuse ([place])  F14.10          Chizara Mena, Jenny Reichmann, MD 06/17/22 5072    Shanon Rosser, MD 06/17/22 2575    Shanon Rosser, MD 06/17/22 [phone]

## 2022-06-18 ENCOUNTER — Telehealth: Payer: Self-pay

## 2022-06-18 ENCOUNTER — Other Ambulatory Visit: Payer: Self-pay

## 2022-06-18 DIAGNOSIS — K219 Gastro-esophageal reflux disease without esophagitis: Secondary | ICD-10-CM

## 2022-06-18 DIAGNOSIS — R112 Nausea with vomiting, unspecified: Secondary | ICD-10-CM

## 2022-06-18 DIAGNOSIS — D509 Iron deficiency anemia, unspecified: Secondary | ICD-10-CM

## 2022-06-18 DIAGNOSIS — R634 Abnormal weight loss: Secondary | ICD-10-CM

## 2022-06-18 DIAGNOSIS — Z86010 Personal history of colonic polyps: Secondary | ICD-10-CM

## 2022-06-18 DIAGNOSIS — K921 Melena: Secondary | ICD-10-CM

## 2022-06-18 MED ORDER — NA SULFATE-K SULFATE-MG SULF 17.5-3.13-1.6 GM/177ML PO SOLN: 1.0000 | Freq: Once | ORAL | 0 refills | Status: AC

## 2022-06-18 NOTE — Telephone Encounter (Signed)
See 10/27 CBC notes for hospital scheduling information.

## 2022-06-18 NOTE — Telephone Encounter (Signed)
-----   Message from Gillermina Hu, RN sent at 06/13/2022  1:57 PM EDT -----  ----- Message ----- From: Noralyn Pick, NP Sent: 06/11/2022   2:57 PM EDT To: Gillermina Hu, RN     ----- Message ----- From: Lavena Bullion, DO Sent: 06/11/2022   2:34 PM EDT To: Noralyn Pick, NP     ----- Message ----- From: Noralyn Pick, NP Sent: 06/06/2022   4:45 PM EDT To: Lavena Bullion, DO  Dr. Bryan Lemma, please review today's consults and verify when you can do an EGD and colonoscopy at [facility].  Let me know if you want any abdominal imaging prior to proceeding with EGD/colonoscopy.  THX

## 2022-06-30 NOTE — Telephone Encounter (Signed)
Yes

## 2022-06-30 NOTE — Telephone Encounter (Signed)
Inbound call from [place] with the [place] pharmacy in regards to prep medication. Does not carry suprep asking for go lightly. Please advise at [phone].  Thank you

## 2022-06-30 NOTE — Telephone Encounter (Signed)
Updated instructions sent to pt's mychart and mailed. Called pt and let him know that updated instructions were mailed and sent to his mychart.

## 2022-06-30 NOTE — Telephone Encounter (Signed)
Is it okay for pt to use golytely? pt is on dialysis.

## 2022-06-30 NOTE — Telephone Encounter (Signed)
Called pharmacy back and let them know pt can use golytely.

## 2022-07-04 ENCOUNTER — Emergency Department (HOSPITAL_COMMUNITY): Payer: No Typology Code available for payment source

## 2022-07-04 ENCOUNTER — Emergency Department (HOSPITAL_COMMUNITY)
Admission: EM | Admit: 2022-07-04 | Discharge: 2022-07-04 | Discharge status: Against Medical Advice | Payer: No Typology Code available for payment source | Attending: Emergency Medicine | Admitting: Emergency Medicine

## 2022-07-04 ENCOUNTER — Other Ambulatory Visit: Payer: Self-pay

## 2022-07-04 DIAGNOSIS — N186 End stage renal disease: Secondary | ICD-10-CM | POA: Insufficient documentation

## 2022-07-04 DIAGNOSIS — R0600 Dyspnea, unspecified: Secondary | ICD-10-CM

## 2022-07-04 DIAGNOSIS — R7309 Other abnormal glucose: Secondary | ICD-10-CM | POA: Diagnosis not present

## 2022-07-04 DIAGNOSIS — R0602 Shortness of breath: Secondary | ICD-10-CM | POA: Diagnosis present

## 2022-07-04 DIAGNOSIS — J811 Chronic pulmonary edema: Secondary | ICD-10-CM | POA: Diagnosis not present

## 2022-07-04 DIAGNOSIS — Z992 Dependence on renal dialysis: Secondary | ICD-10-CM | POA: Insufficient documentation

## 2022-07-04 DIAGNOSIS — I509 Heart failure, unspecified: Secondary | ICD-10-CM | POA: Insufficient documentation

## 2022-07-04 DIAGNOSIS — Z7982 Long term (current) use of aspirin: Secondary | ICD-10-CM | POA: Diagnosis not present

## 2022-07-04 DIAGNOSIS — R0989 Other specified symptoms and signs involving the circulatory and respiratory systems: Secondary | ICD-10-CM

## 2022-07-04 DIAGNOSIS — Z5329 Procedure and treatment not carried out because of patient's decision for other reasons: Secondary | ICD-10-CM | POA: Diagnosis not present

## 2022-07-04 LAB — BASIC METABOLIC PANEL
Anion gap: 9 (ref 5–15)
BUN: 35 mg/dL — ABNORMAL HIGH (ref 8–23)
CO2: 27 mmol/L (ref 22–32)
Calcium: 9.1 mg/dL (ref 8.9–10.3)
Chloride: 104 mmol/L (ref 98–111)
Creatinine, Ser: 5.14 mg/dL — ABNORMAL HIGH (ref 0.61–1.24)
GFR, Estimated: 12 mL/min — ABNORMAL LOW (ref >60)
Glucose, Bld: 110 mg/dL — ABNORMAL HIGH (ref 70–99)
Potassium: 3.9 mmol/L (ref 3.5–5.1)
Sodium: 140 mmol/L (ref 135–145)

## 2022-07-04 LAB — CBC WITH DIFFERENTIAL/PLATELET
Abs Immature Granulocytes: 0.05 10*3/uL (ref 0.00–0.07)
Basophils Absolute: 0.1 10*3/uL (ref 0.0–0.1)
Basophils Relative: 1 %
Eosinophils Absolute: 0.3 10*3/uL (ref 0.0–0.5)
Eosinophils Relative: 5 %
HCT: 38.5 % — ABNORMAL LOW (ref 39.0–52.0)
Hemoglobin: 12 g/dL — ABNORMAL LOW (ref 13.0–17.0)
Immature Granulocytes: 1 %
Lymphocytes Relative: 17 %
Lymphs Abs: 1.3 10*3/uL (ref 0.7–4.0)
MCH: 28.6 pg (ref 26.0–34.0)
MCHC: 31.2 g/dL (ref 30.0–36.0)
MCV: 91.9 fL (ref 80.0–100.0)
Monocytes Absolute: 0.6 10*3/uL (ref 0.1–1.0)
Monocytes Relative: 8 %
Neutro Abs: 4.9 10*3/uL (ref 1.7–7.7)
Neutrophils Relative %: 68 %
Platelets: 179 10*3/uL (ref 150–400)
RBC: 4.19 MIL/uL — ABNORMAL LOW (ref 4.22–5.81)
RDW: 14.2 % (ref 11.5–15.5)
WBC: 7.2 10*3/uL (ref 4.0–10.5)
nRBC: 0 % (ref 0.0–0.2)

## 2022-07-04 LAB — CBG MONITORING, ED: Glucose-Capillary: 105 mg/dL — ABNORMAL HIGH (ref 70–99)

## 2022-07-04 MED ORDER — ISOSORBIDE MONONITRATE ER 30 MG PO TB24
30.0000 mg | ORAL_TABLET | Freq: Every day | ORAL | Status: DC
  Filled 2022-07-04: qty 1

## 2022-07-04 MED ORDER — HYDRALAZINE HCL 25 MG PO TABS
50.0000 mg | ORAL_TABLET | ORAL | Status: DC
  Filled 2022-07-04: qty 2

## 2022-07-04 NOTE — ED Provider Notes (Signed)
[facility] DEPT Provider Note   CSN: [id number] Arrival date & time: 07/04/22  4782     History  Chief Complaint  Patient presents with  . Shortness of Breath    Jasmeet Gehl is a 66 y.o. male.  HPI     66 year old male comes in with chief complaint of shortness of breath.  Patient has history of ESRD on hemodialysis TTS, also has CHF history.  Patient's last hemodialysis was on Wednesday.  Patient states that he started feeling short of breath last night.  He went to hemodialysis today, but was sent to the ER because they did not have any open unit.  Patient has worsening shortness of breath with laying flat.  He denies any chest pain.  His hemodialysis session is scheduled for tomorrow.  Home Medications Prior to Admission medications   Medication Sig Start Date End Date Taking? Authorizing Provider  albuterol (VENTOLIN HFA) 108 (90 Base) MCG/ACT inhaler Inhale 2 puffs into the lungs every 6 (six) hours as needed for wheezing or shortness of breath.    [provider]  amLODipine (NORVASC) 10 MG tablet Take 1 tablet (10 mg total) by mouth daily. 01/01/18   Lelon Perla, MD  aspirin EC 81 MG tablet Take 81 mg by mouth daily.    [provider]  diclofenac Sodium (VOLTAREN) 1 % GEL Apply 2 g topically 3 (three) times daily as needed (to affected areas for pain).    [provider]  hydrALAZINE (APRESOLINE) 25 MG tablet Take 2 tablets (50 mg total) by mouth every morning. Patient taking differently: Take 25 mg by mouth 2 (two) times daily. 01/01/18   Lelon Perla, MD  isosorbide mononitrate (IMDUR) 30 MG 24 hr tablet Take 1 tablet (30 mg total) by mouth daily. 01/01/18   Lelon Perla, MD  labetalol (NORMODYNE) 100 MG tablet Take 2 tablets (200 mg total) by mouth every morning. Patient taking differently: Take 100 mg by mouth 2 (two) times daily. 01/01/18   Lelon Perla, MD  Nutritional Supplements (ENSURE  CLEAR) LIQD Take 1 Bottle by mouth in the morning and at bedtime.    [provider]  omeprazole (PRILOSEC) 40 MG capsule Take 1 capsule (40 mg total) by mouth daily. 06/06/22   Noralyn Pick, NP  rosuvastatin (CRESTOR) 40 MG tablet Take 40 mg by mouth every other day. 02/04/21   [provider]  tamsulosin (FLOMAX) 0.4 MG CAPS capsule Take 0.8 mg by mouth in the morning.    [provider]      Allergies    Patient has no known allergies.    Review of Systems   Review of Systems  Physical Exam Updated Vital Signs BP (!) 169/82 (BP Location: Right Arm)   Pulse 84   Temp 98 F (36.7 C) (Oral)   Resp 16   SpO2 97%  Physical Exam Vitals and nursing note reviewed.  Constitutional:      Appearance: He is well-developed.  HENT:     Head: Atraumatic.  Cardiovascular:     Rate and Rhythm: Normal rate.  Pulmonary:     Effort: Pulmonary effort is normal.     Breath sounds: Examination of the right-middle field reveals rales. Examination of the left-middle field reveals rales. Examination of the right-lower field reveals rales. Examination of the left-lower field reveals rales. Rales present. No decreased breath sounds.  Musculoskeletal:     Cervical back: Neck supple.  Skin:  General: Skin is warm.  Neurological:     Mental Status: He is alert and oriented to person, place, and time.     ED Results / Procedures / Treatments   Labs (all labs ordered are listed, but only abnormal results are displayed) Labs Reviewed  BASIC METABOLIC PANEL - Abnormal; Notable for the following components:      Result Value   Glucose, Bld 110 (*)    BUN 35 (*)    Creatinine, Ser 5.14 (*)    GFR, Estimated 12 (*)    All other components within normal limits  CBC WITH DIFFERENTIAL/PLATELET - Abnormal; Notable for the following components:   RBC 4.19 (*)    Hemoglobin 12.0 (*)    HCT 38.5 (*)    All other components within normal limits  CBG MONITORING, ED  - Abnormal; Notable for the following components:   Glucose-Capillary 105 (*)    All other components within normal limits    EKG EKG Interpretation  Date/Time:  Friday July 04 2022 07:31:23 EST Ventricular Rate:  85 PR Interval:  183 QRS Duration: 103 QT Interval:  393 QTC Calculation: 468 R Axis:   63 Text Interpretation: Sinus rhythm Ventricular bigeminy Right atrial enlargement LVH with secondary repolarization abnormality Anterior infarct, old Nonspecific ST and T wave abnormality Confirmed by Varney Biles [phone]) on 07/04/2022 7:58:25 AM  Radiology DG Chest Port 1 View  Result Date: 07/04/2022 CLINICAL DATA:  Shortness of breath, dialysis patient, however unable to get scheduled dialysis yesterday EXAM: PORTABLE CHEST 1 VIEW COMPARISON:  Chest x-ray June 17, 2022 FINDINGS: Stable positioning of the right central venous catheter, terminating in the low SVC. Stable cardiomegaly. Unchanged mediastinal contours, including calcified atherosclerosis of the aortic arch. Prominent perihilar markings but no focal pulmonary opacity. No large pleural effusion or pneumothorax. The visualized upper abdomen is unremarkable. No acute osseous abnormality. IMPRESSION: 1. Evidence of pulmonary vascular congestion without overt pulmonary edema. 2. Unchanged cardiomegaly. Electronically Signed   By: Beryle Flock M.D.   On: 07/04/2022 07:55    Procedures .Critical Care  Performed by: Varney Biles, MD Authorized by: Varney Biles, MD   Critical care provider statement:    Critical care time (minutes):  32   Critical care was necessary to treat or prevent imminent or life-threatening deterioration of the following conditions:  Respiratory failure   Critical care was time spent personally by me on the following activities:  Development of treatment plan with patient or surrogate, discussions with consultants, evaluation of patient's response to treatment, examination of patient,  ordering and review of laboratory studies, ordering and review of radiographic studies, ordering and performing treatments and interventions, pulse oximetry, re-evaluation of patient's condition, review of old charts and obtaining history from patient or surrogate     Medications Ordered in ED Medications  hydrALAZINE (APRESOLINE) tablet 50 mg (has no administration in time range)  isosorbide mononitrate (IMDUR) 24 hr tablet 30 mg (has no administration in time range)    ED Course/ Medical Decision Making/ A&P Clinical Course as of 07/04/22 0902  Fri Jul 04, 2022  0800 DG Chest [place] 1 View X-ray reviewed and interpreted independently.  There is evidence of pulmonary vascular congestion, no pleural effusion. [AN]  0859 Patient's lab results are reassuring.  No hyperkalemia.  Patient does not have hyperacute T waves on EKG.  BP slightly elevated.  Home meds ordered.  I consulted nephrology, and they were able to facilitate hemodialysis at a hemodialysis center for 11:15 AM.  Patient reassessed.  He is tachypneic, still no hypoxia, still no respiratory distress.  Advised that he remain in the ER until closer to 11 AM, and go straight from here to his hemodialysis center.  Patient however wants to leave.  He wants to eat something before he goes for his dialysis which takes several hours.  Advised that he does not eat anything salty.  He understands that he will or can get worse in terms of his respiration status and, in which case he like to call 911.  Patient's significant other with Mr. This conversation. [AN]  0900 Patient wants to leave against medical advice. Patient understands that with his actions he is at risk of complications of flash pulmonary edema, respiratory distress or respiratory arrest, which includes morbidity and mortality.  Alternative options discussed waiting until 10 AM Opportunity to change mind given Discussion witnessed by significant other Patient is demonstrating  good capacity to make decision. Patient understands that he/she needs to return to the ER immediately if his symptoms get worse.   [AN]    Clinical Course User Index [AN] Varney Biles, MD                           Medical Decision Making Amount and/or Complexity of Data Reviewed Labs: ordered. Radiology: ordered. Decision-making details documented in ED Course.  Risk Prescription drug management.  66 year old patient comes in with chief complaint of shortness of breath. Patient has history of ESRD on hemodialysis, CHF.  Last hemodialysis session was on Wednesday instead of Thursday due to Thanksgiving.  Shortness of breath started yesterday.  He has orthopnea.  No leg swelling.  No chest pain.  On exam patient has bibasilar rales up to the middle of the lungs.  Differential diagnosis includes pulmonary edema secondary to ESRD, CHF, pleural effusion, pneumonia.  X-ray of the chest, basic labs ordered.  Patient currently not requiring BiPAP, but his respiratory rate is noted to be in the low 20s, upper teens.    Final Clinical Impression(s) / ED Diagnoses Final diagnoses:  ESRD needing dialysis ([place])  Dyspnea, unspecified type  Pulmonary congestion    Rx / DC Orders ED Discharge Orders     None         Varney Biles, MD 07/04/22 [phone]

## 2022-07-04 NOTE — Discharge Instructions (Signed)
Please go to your [facility] dialysis center at 11 AM.  They said that they will be able to accommodate you around 1115 for hemodialysis.  We had wanted you to stay in the emergency room until you are closer to your dialysis time.  However you have preferred leaving sooner, understanding that you might get worse.  If you start having worsening shortness of breath, call 911.

## 2022-07-04 NOTE — ED Notes (Signed)
Pt stated he had hydralazine at home and would take it there as he no longer wished to wait. As pt is leaving AMA he was not required to stay for medication to arrive. Pt ambulatory out of department.

## 2022-07-04 NOTE — ED Triage Notes (Signed)
Patient coming to ED for evaluation of shortness of breath.  Reports he receives dialysis on T-Th-Sat.  Went to dialysis center this morning and was not scheduled for treatment (was unable to have dialysis yesterday due to holiday).  Had dialysis on Monday and Wednesday.  Scheduled for dialysis on Saturday.  Went to center this morning due to increased [place].  States dialysis center sent him here for evaluation, dialysis, and "they didn't have an empty chair."  Pt reports he has had increasing SHOB recently and has been evaluated for the same.

## 2022-08-02 ENCOUNTER — Encounter (HOSPITAL_BASED_OUTPATIENT_CLINIC_OR_DEPARTMENT_OTHER): Payer: Self-pay | Admitting: Emergency Medicine

## 2022-08-02 ENCOUNTER — Emergency Department (HOSPITAL_BASED_OUTPATIENT_CLINIC_OR_DEPARTMENT_OTHER): Payer: No Typology Code available for payment source

## 2022-08-02 ENCOUNTER — Emergency Department (HOSPITAL_BASED_OUTPATIENT_CLINIC_OR_DEPARTMENT_OTHER)
Admission: EM | Admit: 2022-08-02 | Discharge: 2022-08-03 | Disposition: A | Discharge status: Home / Self Care | Payer: No Typology Code available for payment source | Attending: Emergency Medicine | Admitting: Emergency Medicine

## 2022-08-02 DIAGNOSIS — I120 Hypertensive chronic kidney disease with stage 5 chronic kidney disease or end stage renal disease: Secondary | ICD-10-CM | POA: Diagnosis not present

## 2022-08-02 DIAGNOSIS — J210 Acute bronchiolitis due to respiratory syncytial virus: Secondary | ICD-10-CM | POA: Diagnosis present

## 2022-08-02 DIAGNOSIS — D631 Anemia in chronic kidney disease: Secondary | ICD-10-CM

## 2022-08-02 DIAGNOSIS — N186 End stage renal disease: Secondary | ICD-10-CM | POA: Insufficient documentation

## 2022-08-02 DIAGNOSIS — R062 Wheezing: Secondary | ICD-10-CM

## 2022-08-02 DIAGNOSIS — Z79899 Other long term (current) drug therapy: Secondary | ICD-10-CM | POA: Insufficient documentation

## 2022-08-02 MED ORDER — ALBUTEROL SULFATE (2.5 MG/3ML) 0.083% IN NEBU
2.5000 mg | INHALATION_SOLUTION | Freq: Once | RESPIRATORY_TRACT | Status: AC
  Administered 2022-08-02: 2.5 mg via RESPIRATORY_TRACT
  Filled 2022-08-02: qty 3

## 2022-08-02 MED ORDER — IPRATROPIUM-ALBUTEROL 0.5-2.5 (3) MG/3ML IN SOLN
3.0000 mL | Freq: Once | RESPIRATORY_TRACT | Status: AC
  Administered 2022-08-02: 3 mL via RESPIRATORY_TRACT
  Filled 2022-08-02: qty 3

## 2022-08-02 MED ORDER — ALBUTEROL SULFATE HFA 108 (90 BASE) MCG/ACT IN AERS: 2.0000 | INHALATION_SPRAY | RESPIRATORY_TRACT | Status: DC | PRN

## 2022-08-02 MED ORDER — AEROCHAMBER PLUS FLO-VU MEDIUM MISC
1.0000 | Freq: Once | Status: AC
  Administered 2022-08-02: 1
  Filled 2022-08-02: qty 1

## 2022-08-02 MED ORDER — ALBUTEROL SULFATE (2.5 MG/3ML) 0.083% IN NEBU: 5.0000 mg | INHALATION_SOLUTION | Freq: Once | RESPIRATORY_TRACT | Status: DC

## 2022-08-02 MED ORDER — ALBUTEROL SULFATE HFA 108 (90 BASE) MCG/ACT IN AERS
INHALATION_SPRAY | RESPIRATORY_TRACT | Status: AC
  Administered 2022-08-02: 2 via RESPIRATORY_TRACT
  Filled 2022-08-02: qty 6.7

## 2022-08-02 MED ORDER — IPRATROPIUM BROMIDE 0.02 % IN SOLN: 0.5000 mg | Freq: Once | RESPIRATORY_TRACT | Status: DC

## 2022-08-02 MED ORDER — PREDNISONE 50 MG PO TABS
60.0000 mg | ORAL_TABLET | Freq: Once | ORAL | Status: AC
  Administered 2022-08-02: 60 mg via ORAL
  Filled 2022-08-02: qty 1

## 2022-08-02 NOTE — ED Notes (Signed)
RT educated pt on proper use of MDI w/spacer. Pt able to perform w/out difficulty. Respiratory status stable on RA w/no distress noted at this time. Pt verbalizes understanding of teaching.

## 2022-08-02 NOTE — ED Provider Notes (Signed)
Care assumed from Dr. Maryan Rued, patient with RSV, getting nebulizer treatment, labs pending.  I have reviewed and interpreted his laboratory test, and my interpretation is anemia which is likely secondary to his end-stage renal disease.  Also, mild hyperglycemia.  He is feeling better following nebulizer treatment.  He was able to ambulate in the emergency department without any oxygen desaturation and is felt to be safe for discharge.  He is given an inhaler to take home and instructed to use it every 4 hours as needed, I am discharging him with a prescription for a 5-day course of prednisone.  Return precautions discussed.  Results for orders placed or performed during the hospital encounter of 08/02/22  CBC with Differential/Platelet  Result Value Ref Range   WBC 6.9 4.0 - 10.5 K/uL   RBC 3.87 (L) 4.22 - 5.81 MIL/uL   Hemoglobin 10.8 (L) 13.0 - 17.0 g/dL   HCT 33.0 (L) 39.0 - 52.0 %   MCV 85.3 80.0 - 100.0 fL   MCH 27.9 26.0 - 34.0 pg   MCHC 32.7 30.0 - 36.0 g/dL   RDW 14.7 11.5 - 15.5 %   Platelets 151 150 - 400 K/uL   nRBC 0.0 0.0 - 0.2 %   Neutrophils Relative % 67 %   Neutro Abs 4.7 1.7 - 7.7 K/uL   Lymphocytes Relative 17 %   Lymphs Abs 1.2 0.7 - 4.0 K/uL   Monocytes Relative 12 %   Monocytes Absolute 0.8 0.1 - 1.0 K/uL   Eosinophils Relative 3 %   Eosinophils Absolute 0.2 0.0 - 0.5 K/uL   Basophils Relative 1 %   Basophils Absolute 0.1 0.0 - 0.1 K/uL   Immature Granulocytes 0 %   Abs Immature Granulocytes 0.03 0.00 - 0.07 K/uL  Comprehensive metabolic panel  Result Value Ref Range   Sodium 138 135 - 145 mmol/L   Potassium 3.6 3.5 - 5.1 mmol/L   Chloride 100 98 - 111 mmol/L   CO2 25 22 - 32 mmol/L   Glucose, Bld 145 (H) 70 - 99 mg/dL   BUN 47 (H) 8 - 23 mg/dL   Creatinine, Ser 6.54 (H) 0.61 - 1.24 mg/dL   Calcium 8.8 (L) 8.9 - 10.3 mg/dL   Total Protein 6.8 6.5 - 8.1 g/dL   Albumin 3.4 (L) 3.5 - 5.0 g/dL   AST 38 15 - 41 U/L   ALT 28 0 - 44 U/L   Alkaline  Phosphatase 89 38 - 126 U/L   Total Bilirubin 0.3 0.3 - 1.2 mg/dL   GFR, Estimated 9 (L) >60 mL/min   Anion gap 13 5 - 15   DG Chest Port 1 View  Result Date: 08/02/2022 CLINICAL DATA:  Dyspnea EXAM: PORTABLE CHEST 1 VIEW COMPARISON:  07/04/2022 FINDINGS: The lungs are symmetrically well inflated. Mild bibasilar reticulonodular infiltrate has developed, possibly related to changes of acute infection or aspiration. No pneumothorax or pleural effusion. Right internal jugular hemodialysis catheter tip noted within the superior vena cava. Cardiac size is at the upper limits of normal. Pulmonary vascularity is normal. No acute bone abnormality. IMPRESSION: 1. Interval development of mild bibasilar reticulonodular infiltrate, possibly related to acute infection or aspiration. Electronically Signed   By: Fidela Salisbury M.D.   On: 08/02/2022 22:14   DG Chest Port 1 View  Result Date: 07/04/2022 CLINICAL DATA:  Shortness of breath, dialysis patient, however unable to get scheduled dialysis yesterday EXAM: PORTABLE CHEST 1 VIEW COMPARISON:  Chest x-ray June 17, 2022 FINDINGS: Stable  positioning of the right central venous catheter, terminating in the low SVC. Stable cardiomegaly. Unchanged mediastinal contours, including calcified atherosclerosis of the aortic arch. Prominent perihilar markings but no focal pulmonary opacity. No large pleural effusion or pneumothorax. The visualized upper abdomen is unremarkable. No acute osseous abnormality. IMPRESSION: 1. Evidence of pulmonary vascular congestion without overt pulmonary edema. 2. Unchanged cardiomegaly. Electronically Signed   By: Beryle Flock M.D.   On: 07/62/2633 35:45      Delora Fuel, MD 62/56/38 [id number]

## 2022-08-02 NOTE — ED Triage Notes (Signed)
Pt  here from home dx with rsv on Tuesday pt is a dialysis pt ( went today ) pt is requesting meds for cough and congestion

## 2022-08-03 LAB — COMPREHENSIVE METABOLIC PANEL
ALT: 28 U/L (ref 0–44)
AST: 38 U/L (ref 15–41)
Albumin: 3.4 g/dL — ABNORMAL LOW (ref 3.5–5.0)
Alkaline Phosphatase: 89 U/L (ref 38–126)
Anion gap: 13 (ref 5–15)
BUN: 47 mg/dL — ABNORMAL HIGH (ref 8–23)
CO2: 25 mmol/L (ref 22–32)
Calcium: 8.8 mg/dL — ABNORMAL LOW (ref 8.9–10.3)
Chloride: 100 mmol/L (ref 98–111)
Creatinine, Ser: 6.54 mg/dL — ABNORMAL HIGH (ref 0.61–1.24)
GFR, Estimated: 9 mL/min — ABNORMAL LOW (ref >60)
Glucose, Bld: 145 mg/dL — ABNORMAL HIGH (ref 70–99)
Potassium: 3.6 mmol/L (ref 3.5–5.1)
Sodium: 138 mmol/L (ref 135–145)
Total Bilirubin: 0.3 mg/dL (ref 0.3–1.2)
Total Protein: 6.8 g/dL (ref 6.5–8.1)

## 2022-08-03 LAB — CBC WITH DIFFERENTIAL/PLATELET
Abs Immature Granulocytes: 0.03 10*3/uL (ref 0.00–0.07)
Basophils Absolute: 0.1 10*3/uL (ref 0.0–0.1)
Basophils Relative: 1 %
Eosinophils Absolute: 0.2 10*3/uL (ref 0.0–0.5)
Eosinophils Relative: 3 %
HCT: 33 % — ABNORMAL LOW (ref 39.0–52.0)
Hemoglobin: 10.8 g/dL — ABNORMAL LOW (ref 13.0–17.0)
Immature Granulocytes: 0 %
Lymphocytes Relative: 17 %
Lymphs Abs: 1.2 10*3/uL (ref 0.7–4.0)
MCH: 27.9 pg (ref 26.0–34.0)
MCHC: 32.7 g/dL (ref 30.0–36.0)
MCV: 85.3 fL (ref 80.0–100.0)
Monocytes Absolute: 0.8 10*3/uL (ref 0.1–1.0)
Monocytes Relative: 12 %
Neutro Abs: 4.7 10*3/uL (ref 1.7–7.7)
Neutrophils Relative %: 67 %
Platelets: 151 10*3/uL (ref 150–400)
RBC: 3.87 MIL/uL — ABNORMAL LOW (ref 4.22–5.81)
RDW: 14.7 % (ref 11.5–15.5)
WBC: 6.9 10*3/uL (ref 4.0–10.5)
nRBC: 0 % (ref 0.0–0.2)

## 2022-08-03 MED ORDER — PREDNISONE 50 MG PO TABS: 50.0000 mg | ORAL_TABLET | Freq: Every day | ORAL | 0 refills | Status: DC

## 2022-08-03 NOTE — ED Notes (Signed)
Pt ambulated. O2 sat 96% on room air HR 95.

## 2022-08-03 NOTE — Discharge Instructions (Addendum)
Use the inhaler every 4 hours as needed.  When you use the inhaler, you should take 2 puffs at a time.  Make sure that you go for your scheduled dialysis.  Return if you are having any problems.

## 2022-08-03 NOTE — ED Provider Notes (Signed)
[facility] EMERGENCY DEPT Provider Note   CSN: [id number] Arrival date & time: 08/02/22  1836     History  No chief complaint on file.   Ronald Reeves is a 66 y.o. male.  Patient is a 66 year old male with a history of hypertension, GERD, ESRD on dialysis Tuesday Thursday Saturday, and polysubstance abuse in the past who is presenting today with complaint of worsening cough, shortness of breath and productive cough, with fever.  Patient reports that his symptoms started earlier this week he went to the [place] and was seen by his doctor and tested positive for RSV.  He reports his doctor called in some medication did an x-ray told him he had some additional fluid on his chest and discharged him home.  He reports he never got the medication his doctor prescribed he did go to dialysis on Wednesday and Friday and had a full course and reports had a 2-hour course today because he had missed his regular sessions and he was on a holiday schedule.  He came today because he felt like his cough and shortness of breath was getting worse.  He has been using an inhaler at home but it only works sometimes.  He denies any abdominal pain or new swelling in his extremities.  The history is provided by the patient.       Home Medications Prior to Admission medications   Medication Sig Start Date End Date Taking? Authorizing Provider  albuterol (VENTOLIN HFA) 108 (90 Base) MCG/ACT inhaler Inhale 2 puffs into the lungs every 6 (six) hours as needed for wheezing or shortness of breath.    [provider]  amLODipine (NORVASC) 10 MG tablet Take 1 tablet (10 mg total) by mouth daily. 01/01/18   Lelon Perla, MD  aspirin EC 81 MG tablet Take 81 mg by mouth daily.    [provider]  diclofenac Sodium (VOLTAREN) 1 % GEL Apply 2 g topically 3 (three) times daily as needed (to affected areas for pain).    [provider]  hydrALAZINE (APRESOLINE) 25 MG tablet Take 2 tablets  (50 mg total) by mouth every morning. Patient taking differently: Take 25 mg by mouth 2 (two) times daily. 01/01/18   Lelon Perla, MD  isosorbide mononitrate (IMDUR) 30 MG 24 hr tablet Take 1 tablet (30 mg total) by mouth daily. 01/01/18   Lelon Perla, MD  labetalol (NORMODYNE) 100 MG tablet Take 2 tablets (200 mg total) by mouth every morning. Patient taking differently: Take 100 mg by mouth 2 (two) times daily. 01/01/18   Lelon Perla, MD  Nutritional Supplements (ENSURE CLEAR) LIQD Take 1 Bottle by mouth in the morning and at bedtime.    [provider]  omeprazole (PRILOSEC) 40 MG capsule Take 1 capsule (40 mg total) by mouth daily. 06/06/22   Noralyn Pick, NP  rosuvastatin (CRESTOR) 40 MG tablet Take 40 mg by mouth every other day. 02/04/21   [provider]  tamsulosin (FLOMAX) 0.4 MG CAPS capsule Take 0.8 mg by mouth in the morning.    [provider]      Allergies    Patient has no known allergies.    Review of Systems   Review of Systems  Physical Exam Updated Vital Signs BP 135/68   Pulse 78   Temp 99.9 F (37.7 C)   Resp 18   SpO2 98%  Physical Exam Vitals and nursing note reviewed.  Constitutional:  General: He is not in acute distress.    Appearance: He is well-developed.  HENT:     Head: Normocephalic and atraumatic.  Eyes:     Conjunctiva/sclera: Conjunctivae normal.     Pupils: Pupils are equal, round, and reactive to light.  Cardiovascular:     Rate and Rhythm: Normal rate and regular rhythm.     Heart sounds: No murmur heard. Pulmonary:     Effort: Pulmonary effort is normal. Tachypnea present. No respiratory distress.     Breath sounds: Wheezing present. No rales.     Comments: PermCath present in the right upper chest. Abdominal:     General: There is no distension.     Palpations: Abdomen is soft.     Tenderness: There is no abdominal tenderness. There is no guarding or rebound.   Musculoskeletal:        General: No tenderness. Normal range of motion.     Cervical back: Normal range of motion and neck supple.     Right lower leg: No edema.     Left lower leg: No edema.  Skin:    General: Skin is warm and dry.     Findings: No erythema or rash.  Neurological:     Mental Status: He is alert and oriented to person, place, and time.  Psychiatric:        Behavior: Behavior normal.     ED Results / Procedures / Treatments   Labs (all labs ordered are listed, but only abnormal results are displayed) Labs Reviewed  CBC WITH DIFFERENTIAL/PLATELET  COMPREHENSIVE METABOLIC PANEL    EKG None  Radiology DG Chest Port 1 View  Result Date: 08/02/2022 CLINICAL DATA:  Dyspnea EXAM: PORTABLE CHEST 1 VIEW COMPARISON:  07/04/2022 FINDINGS: The lungs are symmetrically well inflated. Mild bibasilar reticulonodular infiltrate has developed, possibly related to changes of acute infection or aspiration. No pneumothorax or pleural effusion. Right internal jugular hemodialysis catheter tip noted within the superior vena cava. Cardiac size is at the upper limits of normal. Pulmonary vascularity is normal. No acute bone abnormality. IMPRESSION: 1. Interval development of mild bibasilar reticulonodular infiltrate, possibly related to acute infection or aspiration. Electronically Signed   By: Fidela Salisbury M.D.   On: 08/02/2022 22:14    Procedures Procedures    Medications Ordered in ED Medications  albuterol (VENTOLIN HFA) 108 (90 Base) MCG/ACT inhaler 2 puff (2 puffs Inhalation Given 08/02/22 2246)  predniSONE (DELTASONE) tablet 60 mg (60 mg Oral Given 08/02/22 2250)  ipratropium-albuterol (DUONEB) 0.5-2.5 (3) MG/3ML nebulizer solution 3 mL (3 mLs Nebulization Given 08/02/22 2233)  albuterol (PROVENTIL) (2.5 MG/3ML) 0.083% nebulizer solution 2.5 mg (2.5 mg Nebulization Given 08/02/22 2233)  AeroChamber Plus Flo-Vu Medium MISC 1 each (1 each Other Given 08/02/22 2248)    ED  Course/ Medical Decision Making/ A&P                           Medical Decision Making Amount and/or Complexity of Data Reviewed Labs: ordered. Radiology: ordered.  Risk Prescription drug management.   Pt with multiple medical problems and comorbidities and presenting today with a complaint that caries a high risk for morbidity and mortality.  Here today with worsening shortness of breath, productive cough and low-grade fevers.  Patient is known end-stage renal disease as well but has been compliant with his dialysis.  Patient does not look excessively fluid overloaded today.  He is also known tested positive for RSV earlier  this week.  Concern for bronchitis for persistent RSV and viral causes versus pneumonia.  Patient is mentating normally but is mildly tachypneic with diffuse wheezing on exam.  Will give albuterol, Atrovent, prednisone.  I have independently visualized and interpreted pt's images today.  Chest x-ray today with concern for possible infection.  Labs are pending.  Patient may need multiple nebs.  Will need to ambulate with a pulse ox.  On room air without walking oxygen is 98%.  He does not wear oxygen at home.  Does report that he has plenty of inhaler at home.          Final Clinical Impression(s) / ED Diagnoses Final diagnoses:  RSV (acute bronchiolitis due to respiratory syncytial virus)  Wheezing    Rx / DC Orders ED Discharge Orders     None         Blanchie Dessert, MD 08/03/22 [phone]

## 2022-08-05 ENCOUNTER — Encounter (HOSPITAL_COMMUNITY): Payer: Self-pay | Admitting: Emergency Medicine

## 2022-08-05 ENCOUNTER — Emergency Department (HOSPITAL_COMMUNITY)
Admission: EM | Admit: 2022-08-05 | Discharge: 2022-08-05 | Discharge status: Against Medical Advice | Payer: No Typology Code available for payment source | Attending: Student | Admitting: Student

## 2022-08-05 ENCOUNTER — Emergency Department (HOSPITAL_COMMUNITY): Payer: No Typology Code available for payment source

## 2022-08-05 DIAGNOSIS — Z5321 Procedure and treatment not carried out due to patient leaving prior to being seen by health care provider: Secondary | ICD-10-CM | POA: Diagnosis not present

## 2022-08-05 DIAGNOSIS — R1032 Left lower quadrant pain: Secondary | ICD-10-CM | POA: Insufficient documentation

## 2022-08-05 LAB — COMPREHENSIVE METABOLIC PANEL
ALT: 59 U/L — ABNORMAL HIGH (ref 0–44)
AST: 74 U/L — ABNORMAL HIGH (ref 15–41)
Albumin: 2.7 g/dL — ABNORMAL LOW (ref 3.5–5.0)
Alkaline Phosphatase: 97 U/L (ref 38–126)
Anion gap: 13 (ref 5–15)
BUN: 62 mg/dL — ABNORMAL HIGH (ref 8–23)
CO2: 24 mmol/L (ref 22–32)
Calcium: 8.6 mg/dL — ABNORMAL LOW (ref 8.9–10.3)
Chloride: 105 mmol/L (ref 98–111)
Creatinine, Ser: 7.46 mg/dL — ABNORMAL HIGH (ref 0.61–1.24)
GFR, Estimated: 8 mL/min — ABNORMAL LOW (ref >60)
Glucose, Bld: 159 mg/dL — ABNORMAL HIGH (ref 70–99)
Potassium: 4.1 mmol/L (ref 3.5–5.1)
Sodium: 142 mmol/L (ref 135–145)
Total Bilirubin: 0.6 mg/dL (ref 0.3–1.2)
Total Protein: 6.5 g/dL (ref 6.5–8.1)

## 2022-08-05 LAB — CBC WITH DIFFERENTIAL/PLATELET
Abs Immature Granulocytes: 0.07 10*3/uL (ref 0.00–0.07)
Basophils Absolute: 0 10*3/uL (ref 0.0–0.1)
Basophils Relative: 0 %
Eosinophils Absolute: 0 10*3/uL (ref 0.0–0.5)
Eosinophils Relative: 0 %
HCT: 32.6 % — ABNORMAL LOW (ref 39.0–52.0)
Hemoglobin: 10.6 g/dL — ABNORMAL LOW (ref 13.0–17.0)
Immature Granulocytes: 1 %
Lymphocytes Relative: 9 %
Lymphs Abs: 0.7 10*3/uL (ref 0.7–4.0)
MCH: 28 pg (ref 26.0–34.0)
MCHC: 32.5 g/dL (ref 30.0–36.0)
MCV: 86 fL (ref 80.0–100.0)
Monocytes Absolute: 0.3 10*3/uL (ref 0.1–1.0)
Monocytes Relative: 4 %
Neutro Abs: 6.5 10*3/uL (ref 1.7–7.7)
Neutrophils Relative %: 86 %
Platelets: 206 10*3/uL (ref 150–400)
RBC: 3.79 MIL/uL — ABNORMAL LOW (ref 4.22–5.81)
RDW: 15 % (ref 11.5–15.5)
WBC: 7.6 10*3/uL (ref 4.0–10.5)
nRBC: 0 % (ref 0.0–0.2)

## 2022-08-05 LAB — LIPASE, BLOOD: Lipase: 47 U/L (ref 11–51)

## 2022-08-05 NOTE — ED Notes (Signed)
Called for pt last call.

## 2022-08-05 NOTE — ED Notes (Signed)
Called pt x2 for vitals, no response. °

## 2022-08-05 NOTE — ED Provider Triage Note (Signed)
Emergency Medicine Provider Triage Evaluation Note  Hakeem Frazzini , a 66 y.o. male  was evaluated in triage.  Pt complains of sharp left lower quadrant abdominal pain.  Patient states he was diagnosed with RSV a week ago.  He went to the [place] for evaluation today of his abdominal pain and they recommended he come to [facility] for further evaluation.  Patient was supposed to have dialysis at noon today.  Patient does still make urine.  Patient denies nausea, vomiting, shortness of breath, chest pain.  Review of Systems  Positive: As above Negative: As above  Physical Exam  BP 124/79   Pulse 73   Temp (!) 97.3 F (36.3 C) (Oral)   Resp 18   Ht '5\' 10"'$  (1.778 m)   Wt 68 kg   SpO2 99%   BMI 21.51 kg/m  Gen:   Awake, no distress   Resp:  Normal effort  MSK:   Moves extremities without difficulty  Other:  LLQ tender to palpation  Medical Decision Making  Medically screening exam initiated at 12:58 PM.  Appropriate orders placed.  Santosh Petter was informed that the remainder of the evaluation will be completed by another provider, this initial triage assessment does not replace that evaluation, and the importance of remaining in the ED until their evaluation is complete.     Dorothyann Peng, PA-C 08/05/22 1259

## 2022-08-05 NOTE — ED Triage Notes (Signed)
Pt sent from the [place] with RSV. Pt endorses sharp abd pain for 3 days. Pt due for dialysis today.

## 2022-08-05 NOTE — ED Notes (Signed)
Called pt x4 for vitals, no response. 

## 2022-08-14 ENCOUNTER — Encounter (HOSPITAL_COMMUNITY): Payer: Self-pay | Admitting: Gastroenterology

## 2022-08-14 NOTE — Progress Notes (Signed)
Attempted to obtain medical history via telephone, unable to reach at this time. HIPAA compliant voicemail message left requesting return call to pre surgical testing department. 

## 2022-08-18 ENCOUNTER — Other Ambulatory Visit: Payer: Self-pay

## 2022-08-18 ENCOUNTER — Telehealth: Payer: Self-pay

## 2022-08-18 MED ORDER — PEG 3350-KCL-NA BICARB-NACL 420 G PO SOLR: 4000.0000 mL | Freq: Once | ORAL | 0 refills | Status: AC

## 2022-08-18 NOTE — Telephone Encounter (Signed)
Ronald Reeves from [place] called stating this patient is scheduled for a colon on 08-20-22 and needs an Rx for his prep.  This needs to be sent to [facility].

## 2022-08-18 NOTE — Telephone Encounter (Signed)
Called pharmacy and spoke with [place]. [place] stated that golytely was delivered by fed[employer] to pt's home address on 07/05/22. Pt states he does not have prep. New prescription sent to [facility]. Called pt and left voicemail on pt's phone letting him know that prep was sent to his pharmacy.

## 2022-08-19 ENCOUNTER — Other Ambulatory Visit: Payer: Self-pay

## 2022-08-19 MED ORDER — PEG 3350-KCL-NA BICARB-NACL 420 G PO SOLR: 4000.0000 mL | Freq: Once | ORAL | 0 refills | Status: AC

## 2022-08-19 NOTE — Anesthesia Preprocedure Evaluation (Addendum)
Anesthesia Evaluation  Patient identified by MRN, date of birth, ID band Patient awake    Reviewed: Allergy & Precautions, NPO status , Patient's Chart, lab work & pertinent test results, reviewed documented beta blocker date and time   History of Anesthesia Complications Negative for: history of anesthetic complications  Airway Mallampati: II  TM Distance: >3 FB Neck ROM: Full    Dental no notable dental hx. (+) Dental Advisory Given   Pulmonary Current Smoker and Patient abstained from smoking. Pt wheezing.  After placing on Ste[place]      rales    Cardiovascular hypertension, Pt. on medications and Pt. on home beta blockers + CAD, + Past MI and +CHF (LVEF 45-50%)  + Valvular Problems/Murmurs (mild AI) AI  Rhythm:Regular Rate:Normal  Echo 2019 - Left ventricle: The cavity size was normal. There was moderate  concentric hypertrophy. Systolic function was mildly reduced. The  estimated ejection fraction was in the range of 45% to 50%. There  is hypokinesis of the basal-midinferolateral and inferior  myocardium. The study is not technically sufficient to allow  evaluation of LV diastolic function.  - Aortic valve: There was mild regurgitation.  - Mitral valve: There was trivial regurgitation.  - Left atrium: The atrium was mildly dilated.  - Pulmonic valve: There was trivial regurgitation.    Neuro/Psych  PSYCHIATRIC DISORDERS  Depression    negative neurological ROS     GI/Hepatic ,GERD  Controlled,,(+)     substance abuse  cocaine use and marijuana useAdmits to taking cocaine 3 days ago    Endo/Other  diabetes (pre-diabetic)    Renal/GU ESRF and DialysisRenal diseaseClotted fistula  Pt Tu Thu SA dialysis Pt did not go to dialysis yesterday because of diarrhea  negative genitourinary   Musculoskeletal  (+) Arthritis , Osteoarthritis,    Abdominal   Peds  Hematology  (+) Blood dyscrasia, anemia Hb  8.3, plt 130    Anesthesia Other Findings   Reproductive/Obstetrics negative OB ROS                             Anesthesia Physical Anesthesia Plan  ASA: 4  Anesthesia Plan: MAC   Post-op Pain Management:    Induction: Intravenous  PONV Risk Score and Plan: 1 and Treatment may vary due to age or medical condition  Airway Management Planned: Natural Airway and Nasal Cannula  Additional Equipment: None  Intra-op Plan:   Post-operative Plan:   Informed Consent: I have reviewed the patients History and Physical, chart, labs and discussed the procedure including the risks, benefits and alternatives for the proposed anesthesia with the patient or authorized representative who has indicated his/her understanding and acceptance.     Dental advisory given  Plan Discussed with: Anesthesiologist and CRNA  Anesthesia Plan Comments: (EGD + Colonoscopy for IDA, Colon Polyps, N & V  Pt was unable to complete dialysis yesterday because of diarrhea and bowel prep.   Will call dialysis to try to do after procedure)        Anesthesia Quick Evaluation

## 2022-08-19 NOTE — Telephone Encounter (Signed)
PT was to have a RX sent to [facility] in [place] and they have advised him that there hasn't been one sent. Please advise.

## 2022-08-19 NOTE — Telephone Encounter (Signed)
Resent prescription to [facility]. Called and spoke with call center and asked to speak with pharmacy to verify prescription went through and they stated they could not give me any information. Called and spoke with call center again and asked to be transferred to pharmacy. This time I was transferred to voicemail. Left detailed voicemail asking to verify if prescription went through or if there is a fax number I can send prescription to.   Called pt and updated him on what was going on. Pt stated I could send prescription in to [facility]. Prescription sent to [facility].

## 2022-08-19 NOTE — Addendum Note (Signed)
Addended by: Berniece Salines A on: 08/19/2022 02:44 PM   Modules accepted: Orders

## 2022-08-20 ENCOUNTER — Ambulatory Visit (HOSPITAL_BASED_OUTPATIENT_CLINIC_OR_DEPARTMENT_OTHER): Payer: No Typology Code available for payment source | Admitting: Anesthesiology

## 2022-08-20 ENCOUNTER — Encounter (HOSPITAL_COMMUNITY): Payer: Self-pay | Admitting: Gastroenterology

## 2022-08-20 ENCOUNTER — Encounter (HOSPITAL_COMMUNITY)
Admission: RE | Disposition: A | Discharge status: Home / Self Care | Payer: Self-pay | Source: Home / Self Care | Attending: Gastroenterology

## 2022-08-20 ENCOUNTER — Ambulatory Visit (HOSPITAL_COMMUNITY): Payer: No Typology Code available for payment source | Admitting: Anesthesiology

## 2022-08-20 ENCOUNTER — Ambulatory Visit (HOSPITAL_COMMUNITY)
Admission: RE | Admit: 2022-08-20 | Discharge: 2022-08-20 | Disposition: A | Discharge status: Home / Self Care | Payer: No Typology Code available for payment source | Attending: Gastroenterology | Admitting: Gastroenterology

## 2022-08-20 DIAGNOSIS — F17290 Nicotine dependence, other tobacco product, uncomplicated: Secondary | ICD-10-CM | POA: Diagnosis not present

## 2022-08-20 DIAGNOSIS — I509 Heart failure, unspecified: Secondary | ICD-10-CM | POA: Insufficient documentation

## 2022-08-20 DIAGNOSIS — M1990 Unspecified osteoarthritis, unspecified site: Secondary | ICD-10-CM | POA: Diagnosis not present

## 2022-08-20 DIAGNOSIS — E1122 Type 2 diabetes mellitus with diabetic chronic kidney disease: Secondary | ICD-10-CM | POA: Diagnosis not present

## 2022-08-20 DIAGNOSIS — K2950 Unspecified chronic gastritis without bleeding: Secondary | ICD-10-CM | POA: Diagnosis not present

## 2022-08-20 DIAGNOSIS — K2970 Gastritis, unspecified, without bleeding: Secondary | ICD-10-CM | POA: Diagnosis not present

## 2022-08-20 DIAGNOSIS — Z6821 Body mass index (BMI) 21.0-21.9, adult: Secondary | ICD-10-CM | POA: Insufficient documentation

## 2022-08-20 DIAGNOSIS — I132 Hypertensive heart and chronic kidney disease with heart failure and with stage 5 chronic kidney disease, or end stage renal disease: Secondary | ICD-10-CM | POA: Diagnosis not present

## 2022-08-20 DIAGNOSIS — F32A Depression, unspecified: Secondary | ICD-10-CM | POA: Diagnosis not present

## 2022-08-20 DIAGNOSIS — R194 Change in bowel habit: Secondary | ICD-10-CM

## 2022-08-20 DIAGNOSIS — N186 End stage renal disease: Secondary | ICD-10-CM | POA: Diagnosis not present

## 2022-08-20 DIAGNOSIS — R112 Nausea with vomiting, unspecified: Secondary | ICD-10-CM

## 2022-08-20 DIAGNOSIS — I351 Nonrheumatic aortic (valve) insufficiency: Secondary | ICD-10-CM | POA: Diagnosis not present

## 2022-08-20 DIAGNOSIS — K219 Gastro-esophageal reflux disease without esophagitis: Secondary | ICD-10-CM

## 2022-08-20 DIAGNOSIS — Z7952 Long term (current) use of systemic steroids: Secondary | ICD-10-CM | POA: Insufficient documentation

## 2022-08-20 DIAGNOSIS — K5730 Diverticulosis of large intestine without perforation or abscess without bleeding: Secondary | ICD-10-CM | POA: Diagnosis not present

## 2022-08-20 DIAGNOSIS — D509 Iron deficiency anemia, unspecified: Secondary | ICD-10-CM | POA: Insufficient documentation

## 2022-08-20 DIAGNOSIS — Z79899 Other long term (current) drug therapy: Secondary | ICD-10-CM | POA: Diagnosis not present

## 2022-08-20 DIAGNOSIS — F1290 Cannabis use, unspecified, uncomplicated: Secondary | ICD-10-CM | POA: Diagnosis not present

## 2022-08-20 DIAGNOSIS — I2510 Atherosclerotic heart disease of native coronary artery without angina pectoris: Secondary | ICD-10-CM | POA: Diagnosis not present

## 2022-08-20 DIAGNOSIS — F1490 Cocaine use, unspecified, uncomplicated: Secondary | ICD-10-CM | POA: Diagnosis not present

## 2022-08-20 DIAGNOSIS — I252 Old myocardial infarction: Secondary | ICD-10-CM | POA: Insufficient documentation

## 2022-08-20 DIAGNOSIS — K635 Polyp of colon: Secondary | ICD-10-CM | POA: Diagnosis not present

## 2022-08-20 DIAGNOSIS — D125 Benign neoplasm of sigmoid colon: Secondary | ICD-10-CM | POA: Diagnosis not present

## 2022-08-20 DIAGNOSIS — R634 Abnormal weight loss: Secondary | ICD-10-CM | POA: Insufficient documentation

## 2022-08-20 HISTORY — PX: COLONOSCOPY WITH PROPOFOL: SHX5780

## 2022-08-20 HISTORY — PX: BIOPSY: SHX5522

## 2022-08-20 HISTORY — PX: ESOPHAGOGASTRODUODENOSCOPY (EGD) WITH PROPOFOL: SHX5813

## 2022-08-20 HISTORY — PX: POLYPECTOMY: SHX5525

## 2022-08-20 LAB — POCT I-STAT, CHEM 8
BUN: 45 mg/dL — ABNORMAL HIGH (ref 8–23)
Calcium, Ion: 0.91 mmol/L — ABNORMAL LOW (ref 1.15–1.40)
Chloride: 109 mmol/L (ref 98–111)
Creatinine, Ser: 9.1 mg/dL — ABNORMAL HIGH (ref 0.61–1.24)
Glucose, Bld: 97 mg/dL (ref 70–99)
HCT: 36 % — ABNORMAL LOW (ref 39.0–52.0)
Hemoglobin: 12.2 g/dL — ABNORMAL LOW (ref 13.0–17.0)
Potassium: 4.9 mmol/L (ref 3.5–5.1)
Sodium: 140 mmol/L (ref 135–145)
TCO2: 22 mmol/L (ref 22–32)

## 2022-08-20 SURGERY — ESOPHAGOGASTRODUODENOSCOPY (EGD) WITH PROPOFOL
Anesthesia: Monitor Anesthesia Care

## 2022-08-20 MED ORDER — PROPOFOL 10 MG/ML IV BOLUS
INTRAVENOUS | Status: DC | PRN
  Administered 2022-08-20: 30 mg via INTRAVENOUS
  Administered 2022-08-20: 20 mg via INTRAVENOUS

## 2022-08-20 MED ORDER — PROPOFOL 1000 MG/100ML IV EMUL
INTRAVENOUS | Status: AC
  Filled 2022-08-20: qty 100

## 2022-08-20 MED ORDER — PHENYLEPHRINE 80 MCG/ML (10ML) SYRINGE FOR IV PUSH (FOR BLOOD PRESSURE SUPPORT)
PREFILLED_SYRINGE | INTRAVENOUS | Status: DC | PRN
  Administered 2022-08-20 (×2): 160 ug via INTRAVENOUS

## 2022-08-20 MED ORDER — SODIUM CHLORIDE 0.9 % IV SOLN: INTRAVENOUS | Status: DC

## 2022-08-20 MED ORDER — PROPOFOL 500 MG/50ML IV EMUL
INTRAVENOUS | Status: DC | PRN
  Administered 2022-08-20: 125 ug/kg/min via INTRAVENOUS

## 2022-08-20 MED ORDER — LIDOCAINE 2% (20 MG/ML) 5 ML SYRINGE
INTRAMUSCULAR | Status: DC | PRN
  Administered 2022-08-20: 80 mg via INTRAVENOUS

## 2022-08-20 SURGICAL SUPPLY — 25 items

## 2022-08-20 NOTE — H&P (Signed)
GASTROENTEROLOGY PROCEDURE H&P NOTE   Primary Care Physician: Gerome Sam, MD    Reason for Procedure:  GERD, nausea/vomiting, dark stools, history of colon polyps, weight loss, anemia  Plan:    EGD, colonoscopy  Patient is appropriate for endoscopic procedure(s) at [facility] Endoscopy unit.  The nature of the procedure, as well as the risks, benefits, and alternatives were carefully and thoroughly reviewed with the patient.  I had a long conversation with the patient today regarding the increased risks of sedation with his underlying comorbidities, missed dialysis, and cocaine use, and he would very much like to proceed with procedures today.  Ample time for discussion and questions allowed. The patient understood, was satisfied, and agreed to proceed.     HPI: Ronald Reeves is a 67 y.o. male who presents for EGD and colonoscopy for evaluation of GERD, nausea/vomiting, dark stools, weight loss, anemia, and colon polyp surveillance.  He did miss his dialysis session yesterday.  Does have some shortness of breath that he attributes to his missed dialysis session.  Does admit to cocaine use 3 days ago.  I-STAT checked in preoperative area and K4.5.  He has been arranged for dialysis to immediately follow procedures today.  Past Medical History:  Diagnosis Date   Anemia    Arthritis    Back pain    Bronchitis    Chronic kidney disease    Coronary artery disease    GERD (gastroesophageal reflux disease)    not a current problem   Heart murmur    Hypertension    Myocardial infarction ([place])    Pre-diabetes    does not have CBG machine.   Substance abuse [facility])     Past Surgical History:  Procedure Laterality Date   AV FISTULA PLACEMENT Left 04/12/2021   Procedure: LEFT ARM ARTERIOVENOUS (AV) FISTULA CREATION;  Surgeon: Marty Heck, MD;  Location: [place];  Service: Vascular;  Laterality: Left;   AV FISTULA PLACEMENT Left 10/18/2021   Procedure: LEFT  ARM BRACHIOBASILIC FISTULA CREATION FIRST STAGE;  Surgeon: Marty Heck, MD;  Location: [place];  Service: Vascular;  Laterality: Left;   [place] Left 01/27/2022   Procedure: LEFT SECOND STAGE BASILIC VEIN TRANSPOSITION;  Surgeon: Marty Heck, MD;  Location: [place];  Service: Vascular;  Laterality: Left;   INSERTION OF DIALYSIS CATHETER Right 05/01/2022   Procedure: INSERTION OF DIALYSIS CATHETER;  Surgeon: [name], MD;  Location: [place];  Service: Vascular;  Laterality: Right;   LIGATION OF COMPETING BRANCHES OF ARTERIOVENOUS FISTULA Left 07/15/2021   Procedure: LIGATION OF COMPETING BRANCHES OF LEFT RADIOCEPHALIC ARTERIOVENOUS FISTULA TIMES TWO;  Surgeon: Marty Heck, MD;  Location: [place];  Service: Vascular;  Laterality: Left;   REVISON OF ARTERIOVENOUS FISTULA Left 07/15/2021   Procedure: REVISON OF LEFT ARTERIOVENOUS FISTULA;  Surgeon: Marty Heck, MD;  Location: [place];  Service: Vascular;  Laterality: Left;   REVISON OF ARTERIOVENOUS FISTULA Left 05/01/2022   Procedure: ARTERIOVENOUS FISTULA WASHOUT OF ARM HEMATOMA;  Surgeon: [name], MD;  Location: [place];  Service: Vascular;  Laterality: Left;    Prior to Admission medications   Medication Sig Start Date End Date Taking? Authorizing Provider  albuterol (VENTOLIN HFA) 108 (90 Base) MCG/ACT inhaler Inhale 2 puffs into the lungs every 6 (six) hours as needed for wheezing or shortness of breath.   Yes [provider]  amLODipine (NORVASC) 10 MG tablet Take  1 tablet (10 mg total) by mouth daily. 01/01/18  Yes Lelon Perla, MD  aspirin EC 81 MG tablet Take 81 mg by mouth daily.   Yes [provider]  calcitRIOL (ROCALTROL) 0.5 MCG capsule Take 0.5 mcg by mouth daily.   Yes [provider]  diclofenac Sodium (VOLTAREN) 1 % GEL Apply 2 g topically 3 (three) times daily as needed (to affected areas for pain).   Yes [provider]  Ergocalciferol (VITAMIN D2 PO) Take by mouth.   Yes [provider]  furosemide (LASIX) 80 MG tablet Take 80 mg by mouth daily. 06/17/22  Yes [provider]  hydrALAZINE (APRESOLINE) 25 MG tablet Take 2 tablets (50 mg total) by mouth every morning. Patient taking differently: Take 25 mg by mouth 2 (two) times daily. 01/01/18  Yes Lelon Perla, MD  isosorbide mononitrate (IMDUR) 30 MG 24 hr tablet Take 1 tablet (30 mg total) by mouth daily. 01/01/18  Yes Lelon Perla, MD  labetalol (NORMODYNE) 100 MG tablet Take 2 tablets (200 mg total) by mouth every morning. Patient taking differently: Take 100 mg by mouth 2 (two) times daily. 01/01/18  Yes Lelon Perla, MD  multivitamin (RENA-VIT) TABS tablet Take 1 tablet by mouth daily. 06/28/22  Yes [provider]  Nutritional Supplements (ENSURE CLEAR) LIQD Take 1 Bottle by mouth daily.   Yes [provider]  omeprazole (PRILOSEC) 40 MG capsule Take 1 capsule (40 mg total) by mouth daily. 06/06/22  Yes Noralyn Pick, NP  tamsulosin (FLOMAX) 0.4 MG CAPS capsule Take 0.8 mg by mouth in the morning.   Yes [provider]  predniSONE (DELTASONE) 50 MG tablet Take 1 tablet (50 mg total) by mouth daily. Patient not taking: Reported on 7/0/2637 85/88/50   Delora Fuel, MD  rosuvastatin (CRESTOR) 40 MG tablet Take 40 mg by mouth every other day. Patient not taking: Reported on 08/19/2022 02/04/21   [provider]    Current Facility-Administered Medications  Medication Dose Route Frequency Provider Last Rate Last Admin   0.9 %  sodium chloride infusion   Intravenous Continuous Rainey Rodger V, DO 20 mL/hr at 08/20/22 0826 New Bag at 08/20/22 0826    Allergies as of 06/16/2022   (No Known Allergies)    Family History  Problem Relation Age of Onset   Heart disease Mother    Heart attack Sister 1    Social History   Socioeconomic History   Marital status: Married     Spouse name: Not on file   Number of children: Not on file   Years of education: Not on file   Highest education level: Not on file  Occupational History   Not on file  Tobacco Use   Smoking status: Every Day    Types: Cigars   Smokeless tobacco: Current   Tobacco comments:    3 black and mild a day  Vaping Use   Vaping Use: Never used  Substance and Sexual Activity   Alcohol use: Yes    Alcohol/week: 14.0 standard drinks of alcohol    Types: 14 Cans of beer per week    Comment: Daily   Drug use: Yes    Frequency: 1.0 times per week    Types: Marijuana, Cocaine    Comment: marijuana daily   Sexual activity: Yes  Other Topics Concern   Not on file  Social History Narrative   Not on file   Social Determinants of Health  Financial Resource Strain: Not on file  Food Insecurity: Not on file  Transportation Needs: Not on file  Physical Activity: Not on file  Stress: Not on file  Social Connections: Not on file  Intimate Partner Violence: Not on file    Physical Exam: Vital signs in last 24 hours: '@BP'$  (!) 174/90   Pulse 69   Temp 97.6 F (36.4 C) (Temporal)   Resp (!) 29   Ht '5\' 10"'$  (1.778 m)   Wt 68 kg   SpO2 100%   BMI 21.51 kg/m  GEN: NAD EYE: Sclerae anicteric ENT: MMM CV: Non-tachycardic Pulm: CTA b/l GI: Soft, NT/ND NEURO:  Alert & Oriented x 3   Gerrit Heck, DO [facility]   08/20/2022 8:28 AM

## 2022-08-20 NOTE — Discharge Instructions (Signed)

## 2022-08-20 NOTE — Op Note (Signed)
[facility] Patient Name: Ronald Reeves Procedure Date: 08/20/2022 MRN: [id number] Attending MD: Gerrit Heck , MD, [id number] Date of Birth: 07-13-1956 CSN: [id number] Age: 67 Admit Type: Outpatient Procedure:                Upper GI endoscopy Indications:              Iron deficiency anemia, Esophageal reflux, Nausea                            with vomiting, Weight loss Providers:                Gerrit Heck, MD, Foye Clock, Technician, [facility], CRNA Referring MD:             Noralyn Pick Medicines:                Monitored Anesthesia Care Complications:            No immediate complications. Estimated Blood Loss:     Estimated blood loss was minimal. Procedure:                Pre-Anesthesia Assessment:                           - Prior to the procedure, a History and Physical                            was performed, and patient medications and                            allergies were reviewed. The patient's tolerance of                            previous anesthesia was also reviewed. The risks                            and benefits of the procedure and the sedation                            options and risks were discussed with the patient.                            All questions were answered, and informed consent                            was obtained. Prior Anticoagulants: The patient has                            taken no anticoagulant or antiplatelet agents                            except for aspirin. ASA Grade Assessment: IV - A  patient with severe systemic disease that is a                            constant threat to life. After reviewing the risks                            and benefits, the patient was deemed in                            satisfactory condition to undergo the procedure.                           After obtaining informed  consent, the endoscope was                            passed under direct vision. Throughout the                            procedure, the patient's blood pressure, pulse, and                            oxygen saturations were monitored continuously. The                            [id number]-[id number] ([id number]) Olympus endoscope was introduced                            through the mouth, and advanced to the second part                            of duodenum. The upper GI endoscopy was                            accomplished without difficulty. The patient                            tolerated the procedure well. Scope In: Scope Out: Findings:      The examined esophagus was normal.      The Z-line was regular and was found 40 cm from the incisors.      Diffuse moderate inflammation characterized by congestion (edema) and       erythema was found in the entire examined stomach. Biopsies were taken       with a cold forceps for histology and Helicobacter pylori testing.       Estimated blood loss was minimal.      The examined duodenum was normal. Biopsies were taken with a cold       forceps for histology. Estimated blood loss was minimal. Impression:               - Normal esophagus.                           - Z-line regular, 40 cm from the incisors.                           -  Moderate, non-ulcer gastritis. Biopsied.                           - Normal examined duodenum. Biopsied. Moderate Sedation:      Not Applicable - Patient had care per Anesthesia. Recommendation:           - Continue present medications.                           - Await pathology results.                           - Perform a colonoscopy today. Procedure Code(s):        --- Professional ---                           [id number], Esophagogastroduodenoscopy, flexible,                            transoral; with biopsy, single or multiple Diagnosis Code(s):        --- Professional ---                           K29.70, Gastritis,  unspecified, without bleeding                           D50.9, Iron deficiency anemia, unspecified                           K21.9, Gastro-esophageal reflux disease without                            esophagitis                           R11.2, Nausea with vomiting, unspecified                           R63.4, Abnormal weight loss CPT copyright 2022 American Medical Association. All rights reserved. The codes documented in this report are preliminary and upon coder review may  be revised to meet current compliance requirements. Gerrit Heck, MD 08/20/2022 9:12:59 AM Number of Addenda: 0

## 2022-08-20 NOTE — Anesthesia Postprocedure Evaluation (Signed)
Anesthesia Post Note  Patient: Ronald Reeves  Procedure(s) Performed: ESOPHAGOGASTRODUODENOSCOPY (EGD) WITH PROPOFOL COLONOSCOPY WITH PROPOFOL BIOPSY POLYPECTOMY     Patient location during evaluation: Endoscopy Anesthesia Type: MAC Level of consciousness: awake and alert Pain management: pain level controlled Vital Signs Assessment: post-procedure vital signs reviewed and stable Respiratory status: spontaneous breathing, nonlabored ventilation, respiratory function stable and patient connected to nasal cannula oxygen Cardiovascular status: blood pressure returned to baseline and stable Postop Assessment: no apparent nausea or vomiting Anesthetic complications: no  No notable events documented.  Last Vitals:  Vitals:   08/20/22 0930 08/20/22 0940  BP: (!) 152/74 (!) 160/75  Pulse: 66 68  Resp: 13 (!) 23  Temp:    SpO2: 100% 99%    Last Pain:  Vitals:   08/20/22 0940  TempSrc:   PainSc: 0-No pain                 Barnet Glasgow

## 2022-08-20 NOTE — Op Note (Signed)
[facility] Patient Name: Ronald Reeves Procedure Date: 08/20/2022 MRN: [id number] Attending MD: Gerrit Heck , MD, [id number] Date of Birth: July 31, 1956 CSN: [id number] Age: 67 Admit Type: Outpatient Procedure:                Colonoscopy Indications:              Iron deficiency anemia, Change in bowel habits,                            Weight loss, History of colon polyps on prior                            colonoscopy 10 years ago (unkown histology) Providers:                Gerrit Heck, MD, Autumn Perrin Smack, Technician, [facility], CRNA Referring MD:             Noralyn Pick Medicines:                Monitored Anesthesia Care Complications:            No immediate complications. Estimated Blood Loss:     Estimated blood loss was minimal. Procedure:                Pre-Anesthesia Assessment:                           - Prior to the procedure, a History and Physical                            was performed, and patient medications and                            allergies were reviewed. The patient's tolerance of                            previous anesthesia was also reviewed. The risks                            and benefits of the procedure and the sedation                            options and risks were discussed with the patient.                            All questions were answered, and informed consent                            was obtained. Prior Anticoagulants: The patient has                            taken no anticoagulant or antiplatelet agents  except for aspirin. ASA Grade Assessment: IV - A                            patient with severe systemic disease that is a                            constant threat to life. After reviewing the risks                            and benefits, the patient was deemed in                            satisfactory condition to  undergo the procedure.                           After obtaining informed consent, the colonoscope                            was passed under direct vision. Throughout the                            procedure, the patient's blood pressure, pulse, and                            oxygen saturations were monitored continuously. The                            [id number]-HQ190L ([id number]) Olympus colonoscope was                            introduced through the anus and advanced to the the                            cecum, identified by appendiceal orifice and                            ileocecal valve. The colonoscopy was performed                            without difficulty. The patient tolerated the                            procedure well. The quality of the bowel                            preparation was fair. The ileocecal valve,                            appendiceal orifice, and rectum were photographed. Scope In: 8:49:31 AM Scope Out: 9:05:12 AM Scope Withdrawal Time: 0 hours 8 minutes 10 seconds  Total Procedure Duration: 0 hours 15 minutes 41 seconds  Findings:      The perianal and digital rectal examinations were normal.      A 2 mm polyp was found  in the sigmoid colon. The polyp was sessile. The       polyp was removed with a cold biopsy forceps. Resection and retrieval       were complete. Estimated blood loss was minimal.      Multiple medium-mouthed and small-mouthed diverticula were found in the       sigmoid colon and descending colon.      The visualized mucosa was otherwise normal appearing throughout the       colon. Biopsies for histology were taken with a cold forceps from the       right colon and left colon for evaluation of microscopic colitis.       Estimated blood loss was minimal.      A moderate amount of stool was found in the entire colon, interfering       with visualization. Lavage of the area was performed using copious       amounts of tap water, resulting in  clearance with fair visualization.      The retroflexed view of the distal rectum and anal verge was normal and       showed no anal or rectal abnormalities. Impression:               - Preparation of the colon was fair.                           - One 2 mm polyp in the sigmoid colon, removed with                            a cold biopsy forceps. Resected and retrieved.                           - Diverticulosis in the sigmoid colon and in the                            descending colon.                           - Normal mucosa in the entire examined colon.                            Biopsied.                           - Stool in the entire examined colon.                           - The distal rectum and anal verge are normal on                            retroflexion view. Moderate Sedation:      Not Applicable - Patient had care per Anesthesia. Recommendation:           - Patient has a contact number available for                            emergencies. The signs and symptoms of potential  delayed complications were discussed with the                            patient. Return to normal activities tomorrow.                            Written discharge instructions were provided to the                            patient.                           - Resume previous diet.                           - Continue present medications.                           - Await pathology results.                           - Repeat colonoscopy in 2 years because the bowel                            preparation was suboptimal and for surveillance.                           - Extended 2-day prep with repeat colonoscopy.                           - To go to dialysis today. Procedure Code(s):        --- Professional ---                           [phone], Colonoscopy, flexible; with biopsy, single                            or multiple Diagnosis Code(s):        --- Professional  ---                           D12.5, Benign neoplasm of sigmoid colon                           D50.9, Iron deficiency anemia, unspecified                           R19.4, Change in bowel habit                           K57.30, Diverticulosis of large intestine without                            perforation or abscess without bleeding CPT copyright 2022 American Medical Association. All rights reserved. The codes documented in this report are preliminary and upon coder review may  be revised to meet current compliance requirements. [employer],  MD 08/20/2022 9:19:40 AM Number of Addenda: 0

## 2022-08-20 NOTE — Transfer of Care (Signed)
Immediate Anesthesia Transfer of Care Note  Patient: Ronald Reeves  Procedure(s) Performed: ESOPHAGOGASTRODUODENOSCOPY (EGD) WITH PROPOFOL COLONOSCOPY WITH PROPOFOL  Patient Location: PACU  Anesthesia Type:MAC  Level of Consciousness: awake, alert , and oriented  Airway & Oxygen Therapy: Patient Spontanous Breathing and Patient connected to face mask oxygen  Post-op Assessment: Report given to RN and Post -op Vital signs reviewed and stable  Post vital signs: Reviewed and stable  Last Vitals:  Vitals Value Taken Time  BP    Temp    Pulse 71 08/20/22 0916  Resp 24 08/20/22 0916  SpO2 100 % 08/20/22 0916  Vitals shown include unvalidated device data.  Last Pain:  Vitals:   08/20/22 0815  TempSrc: Temporal  PainSc: 6       Patients Stated Pain Goal: 3 (75/88/32 5498)  Complications: No notable events documented.

## 2022-08-20 NOTE — Interval H&P Note (Signed)
History and Physical Interval Note:  08/20/2022 8:32 AM  Ronald Reeves  has presented today for surgery, with the diagnosis of IDA, colon polyp surveillance, nausea and vomiting.  The various methods of treatment have been discussed with the patient and family. After consideration of risks, benefits and other options for treatment, the patient has consented to  Procedure(s): ESOPHAGOGASTRODUODENOSCOPY (EGD) WITH PROPOFOL (N/A) COLONOSCOPY WITH PROPOFOL (N/A) as a surgical intervention.  The patient's history has been reviewed, patient examined, no change in status, stable for surgery.  I have reviewed the patient's chart and labs.  Questions were answered to the patient's satisfaction.     Dominic Pea Chevon Fomby

## 2022-08-20 NOTE — Progress Notes (Signed)
Pt arrived to endo unit with dyspnea on exertion when changing. Upon assessment pt states he skipped HD yesterday d/t colon prep. MD Valma Cava and MD Cirigliano made aware. Istat done. MDA would like pt to go to HD today after procedure since regular treatment was missed. Called pt's HDU and informed nurse there that patient needed to come in today for HD since he missed yesterday. Nurse at the HDU said for patient to come straight to their facility after recovery and they would fit him in for treatment today. MD Valma Cava and Cirgiliano made aware, pt's wife called and updated on POC.

## 2022-08-21 LAB — SURGICAL PATHOLOGY

## 2022-08-22 ENCOUNTER — Encounter (HOSPITAL_COMMUNITY): Payer: Self-pay | Admitting: Gastroenterology

## 2022-08-25 ENCOUNTER — Encounter: Payer: Self-pay | Admitting: Gastroenterology

## 2022-09-20 ENCOUNTER — Emergency Department (HOSPITAL_COMMUNITY)
Admission: EM | Admit: 2022-09-20 | Discharge: 2022-09-21 | Disposition: A | Discharge status: Home / Self Care | Payer: No Typology Code available for payment source | Attending: Emergency Medicine | Admitting: Emergency Medicine

## 2022-09-20 ENCOUNTER — Encounter (HOSPITAL_COMMUNITY): Payer: Self-pay | Admitting: *Deleted

## 2022-09-20 ENCOUNTER — Emergency Department (HOSPITAL_COMMUNITY): Payer: No Typology Code available for payment source

## 2022-09-20 ENCOUNTER — Other Ambulatory Visit: Payer: Self-pay

## 2022-09-20 DIAGNOSIS — N186 End stage renal disease: Secondary | ICD-10-CM | POA: Diagnosis not present

## 2022-09-20 DIAGNOSIS — I2510 Atherosclerotic heart disease of native coronary artery without angina pectoris: Secondary | ICD-10-CM | POA: Diagnosis not present

## 2022-09-20 DIAGNOSIS — Z992 Dependence on renal dialysis: Secondary | ICD-10-CM | POA: Diagnosis not present

## 2022-09-20 DIAGNOSIS — Z7982 Long term (current) use of aspirin: Secondary | ICD-10-CM | POA: Diagnosis not present

## 2022-09-20 DIAGNOSIS — R059 Cough, unspecified: Secondary | ICD-10-CM | POA: Diagnosis present

## 2022-09-20 DIAGNOSIS — Z79899 Other long term (current) drug therapy: Secondary | ICD-10-CM | POA: Insufficient documentation

## 2022-09-20 DIAGNOSIS — J189 Pneumonia, unspecified organism: Secondary | ICD-10-CM | POA: Insufficient documentation

## 2022-09-20 DIAGNOSIS — Z1152 Encounter for screening for COVID-19: Secondary | ICD-10-CM | POA: Insufficient documentation

## 2022-09-20 LAB — GROUP A STREP BY PCR: Group A Strep by PCR: NOT DETECTED

## 2022-09-20 LAB — RESP PANEL BY RT-PCR (RSV, FLU A&B, COVID)  RVPGX2
Influenza A by PCR: NEGATIVE
Influenza B by PCR: NEGATIVE
Resp Syncytial Virus by PCR: NEGATIVE
SARS Coronavirus 2 by RT PCR: NEGATIVE

## 2022-09-20 NOTE — ED Triage Notes (Signed)
The pt is here for a covid test  ughter  abd her child has covid   the pt is c/o cold symptoms with  sore throat for 2 days

## 2022-09-20 NOTE — ED Triage Notes (Signed)
The pt had dialysis today earlier he has a dialysis catheter in his  lt chest  no temp

## 2022-09-21 LAB — BASIC METABOLIC PANEL
Anion gap: 14 (ref 5–15)
BUN: 26 mg/dL — ABNORMAL HIGH (ref 8–23)
CO2: 25 mmol/L (ref 22–32)
Calcium: 9.2 mg/dL (ref 8.9–10.3)
Chloride: 101 mmol/L (ref 98–111)
Creatinine, Ser: 5.59 mg/dL — ABNORMAL HIGH (ref 0.61–1.24)
GFR, Estimated: 11 mL/min — ABNORMAL LOW (ref >60)
Glucose, Bld: 100 mg/dL — ABNORMAL HIGH (ref 70–99)
Potassium: 4.4 mmol/L (ref 3.5–5.1)
Sodium: 140 mmol/L (ref 135–145)

## 2022-09-21 LAB — CBC WITH DIFFERENTIAL/PLATELET
Abs Immature Granulocytes: 0.01 10*3/uL (ref 0.00–0.07)
Basophils Absolute: 0.1 10*3/uL (ref 0.0–0.1)
Basophils Relative: 1 %
Eosinophils Absolute: 0.3 10*3/uL (ref 0.0–0.5)
Eosinophils Relative: 6 %
HCT: 34.9 % — ABNORMAL LOW (ref 39.0–52.0)
Hemoglobin: 11.2 g/dL — ABNORMAL LOW (ref 13.0–17.0)
Immature Granulocytes: 0 %
Lymphocytes Relative: 18 %
Lymphs Abs: 1 10*3/uL (ref 0.7–4.0)
MCH: 26.6 pg (ref 26.0–34.0)
MCHC: 32.1 g/dL (ref 30.0–36.0)
MCV: 82.9 fL (ref 80.0–100.0)
Monocytes Absolute: 0.7 10*3/uL (ref 0.1–1.0)
Monocytes Relative: 13 %
Neutro Abs: 3.5 10*3/uL (ref 1.7–7.7)
Neutrophils Relative %: 62 %
Platelets: 158 10*3/uL (ref 150–400)
RBC: 4.21 MIL/uL — ABNORMAL LOW (ref 4.22–5.81)
RDW: 16.2 % — ABNORMAL HIGH (ref 11.5–15.5)
WBC: 5.6 10*3/uL (ref 4.0–10.5)
nRBC: 0 % (ref 0.0–0.2)

## 2022-09-21 MED ORDER — DOXYCYCLINE HYCLATE 100 MG PO CAPS: 100.0000 mg | ORAL_CAPSULE | Freq: Two times a day (BID) | ORAL | 0 refills | Status: DC

## 2022-09-21 NOTE — ED Provider Notes (Signed)
[facility] Provider Note   CSN: [id number] Arrival date & time: 09/20/22  2101     History  Chief Complaint  Patient presents with   wants a covid test    Ke Bungay is a 67 y.o. male.  The history is provided by the patient and medical records.   67 y.o. M with hx of ESRD on HD (Tue/Thur/Sat), alcohol abuse, anemia, CAD, presenting to the ED with cough.  States he has had a dry cough over the past few days.  He denies any associated chest pain or shortness of breath.  States his daughter called him today and told him that she and grandchild both were diagnosed with COVID-19.  He is requesting COVID testing.  He denies any fever or chills.  No vomiting or diarrhea.  He did have full dialysis session yesterday at his usual scheduled time.  Home Medications Prior to Admission medications   Medication Sig Start Date End Date Taking? Authorizing Provider  doxycycline (VIBRAMYCIN) 100 MG capsule Take 1 capsule (100 mg total) by mouth 2 (two) times daily. 09/21/22  Yes Larene Pickett, PA-C  albuterol (VENTOLIN HFA) 108 (90 Base) MCG/ACT inhaler Inhale 2 puffs into the lungs every 6 (six) hours as needed for wheezing or shortness of breath.    [provider]  amLODipine (NORVASC) 10 MG tablet Take 1 tablet (10 mg total) by mouth daily. 01/01/18   Lelon Perla, MD  aspirin EC 81 MG tablet Take 81 mg by mouth daily.    [provider]  calcitRIOL (ROCALTROL) 0.5 MCG capsule Take 0.5 mcg by mouth daily.    [provider]  diclofenac Sodium (VOLTAREN) 1 % GEL Apply 2 g topically 3 (three) times daily as needed (to affected areas for pain).    [provider]  Ergocalciferol (VITAMIN D2 PO) Take by mouth.    [provider]  furosemide (LASIX) 80 MG tablet Take 80 mg by mouth daily. 06/17/22   [provider]  hydrALAZINE (APRESOLINE) 25 MG tablet Take 2 tablets (50 mg total) by mouth every  morning. Patient taking differently: Take 25 mg by mouth 2 (two) times daily. 01/01/18   Lelon Perla, MD  isosorbide mononitrate (IMDUR) 30 MG 24 hr tablet Take 1 tablet (30 mg total) by mouth daily. 01/01/18   Lelon Perla, MD  labetalol (NORMODYNE) 100 MG tablet Take 2 tablets (200 mg total) by mouth every morning. Patient taking differently: Take 100 mg by mouth 2 (two) times daily. 01/01/18   Lelon Perla, MD  multivitamin (RENA-VIT) TABS tablet Take 1 tablet by mouth daily. 06/28/22   [provider]  Nutritional Supplements (ENSURE CLEAR) LIQD Take 1 Bottle by mouth daily.    [provider]  omeprazole (PRILOSEC) 40 MG capsule Take 1 capsule (40 mg total) by mouth daily. 06/06/22   Noralyn Pick, NP  predniSONE (DELTASONE) 50 MG tablet Take 1 tablet (50 mg total) by mouth daily. Patient not taking: Reported on [id number]   Delora Fuel, MD  rosuvastatin (CRESTOR) 40 MG tablet Take 40 mg by mouth every other day. 02/04/21   [provider]  tamsulosin (FLOMAX) 0.4 MG CAPS capsule Take 0.8 mg by mouth in the morning.    [provider]      Allergies    Patient has no known allergies.    Review of Systems   Review of Systems  Respiratory:  Positive  for cough.   All other systems reviewed and are negative.   Physical Exam Updated Vital Signs BP (!) 168/114   Pulse 83   Temp 98 F (36.7 C) (Oral)   Resp 18   Ht 5' 10"$  (1.778 m)   Wt 68 kg   SpO2 98%   BMI 21.51 kg/m   Physical Exam Vitals and nursing note reviewed.  Constitutional:      Appearance: He is well-developed.  HENT:     Head: Normocephalic and atraumatic.  Eyes:     Conjunctiva/sclera: Conjunctivae normal.     Pupils: Pupils are equal, round, and reactive to light.  Cardiovascular:     Rate and Rhythm: Normal rate and regular rhythm.     Heart sounds: Normal heart sounds.  Pulmonary:     Effort: Pulmonary effort is normal.     Breath  sounds: Normal breath sounds.  Chest:     Comments: Dialysis catheter left chest wall, clean, no signs of infection Abdominal:     General: Bowel sounds are normal.     Palpations: Abdomen is soft.  Musculoskeletal:        General: Normal range of motion.     Cervical back: Normal range of motion.  Skin:    General: Skin is warm and dry.  Neurological:     Mental Status: He is alert and oriented to person, place, and time.     ED Results / Procedures / Treatments   Labs (all labs ordered are listed, but only abnormal results are displayed) Labs Reviewed  CBC WITH DIFFERENTIAL/PLATELET - Abnormal; Notable for the following components:      Result Value   RBC 4.21 (*)    Hemoglobin 11.2 (*)    HCT 34.9 (*)    RDW 16.2 (*)    All other components within normal limits  BASIC METABOLIC PANEL - Abnormal; Notable for the following components:   Glucose, Bld 100 (*)    BUN 26 (*)    Creatinine, Ser 5.59 (*)    GFR, Estimated 11 (*)    All other components within normal limits  GROUP A STREP BY PCR  RESP PANEL BY RT-PCR (RSV, FLU A&B, COVID)  [id number]    EKG None  Radiology DG Chest 2 View  Result Date: 09/20/2022 CLINICAL DATA:  cold cough exposed to covid EXAM: CHEST - 2 VIEW COMPARISON:  Chest x-ray 08/02/2022 FINDINGS: Left chest wall dialysis catheter with tip overlying the expected region of the superior cavoatrial junction. The heart and mediastinal contours are within normal limits. Retrocardiac airspace opacity. No pulmonary edema. No pleural effusion. No pneumothorax. No acute osseous abnormality. IMPRESSION: Retrocardiac airspace opacity. Followup PA and lateral chest X-ray is recommended in 3-4 weeks following therapy to ensure resolution. Electronically Signed   By: Iven Finn M.D.   On: 09/20/2022 22:15    Procedures Procedures    Medications Ordered in ED Medications - No data to display  ED Course/ Medical Decision Making/ A&P                              Medical Decision Making Amount and/or Complexity of Data Reviewed Labs: ordered. Radiology: ordered and independent interpretation performed. ECG/medicine tests: ordered and independent interpretation performed.  Risk Prescription drug management.   66 year old male presenting to the ED with cough.  He is requesting COVID testing as he found out his daughter and grandchild were diagnosed with COVID-51  this morning.  He is afebrile and nontoxic in appearance.  He does not have any cough observed on my exam.  He did have COVID screen obtained from triage which is negative.  Chest x-ray does reveal retrocardiac opacity concerning for pneumonia.  He had full dialysis treatment this morning, labs today are overall reassuring.  He has no leukocytosis or hyperkalemia.  His EKG is nonischemic.  Does have remained stable here in the emergency department.  He is not hypoxic or having any signs of labored breathing so I feel he is safe for outpatient management.  Will start on course of doxycycline and have him follow-up closely with his primary care doctor.  He can return here for any new/acute changes.  Final Clinical Impression(s) / ED Diagnoses Final diagnoses:  Community acquired pneumonia, unspecified laterality    Rx / DC Orders ED Discharge Orders          Ordered    doxycycline (VIBRAMYCIN) 100 MG capsule  2 times daily        09/21/22 0515              Larene Pickett, PA-C 09/21/22 0542    Orpah Greek, MD 09/21/22 2306

## 2022-09-21 NOTE — Discharge Instructions (Addendum)
You were found to have pneumonia on your chest x-ray today.  Covid/flu testing was negative. Take the prescribed medication as directed. Follow-up with your primary care doctor. Return to the ED for new or worsening symptoms.

## 2022-09-26 ENCOUNTER — Other Ambulatory Visit: Payer: Self-pay | Admitting: *Deleted

## 2022-09-26 DIAGNOSIS — N186 End stage renal disease: Secondary | ICD-10-CM

## 2022-10-11 DIAGNOSIS — H2513 Age-related nuclear cataract, bilateral: Secondary | ICD-10-CM | POA: Diagnosis not present

## 2022-10-11 DIAGNOSIS — H40033 Anatomical narrow angle, bilateral: Secondary | ICD-10-CM | POA: Diagnosis not present

## 2022-10-14 ENCOUNTER — Encounter: Payer: Self-pay | Admitting: Vascular Surgery

## 2022-10-14 ENCOUNTER — Ambulatory Visit (HOSPITAL_COMMUNITY)
Admission: RE | Admit: 2022-10-14 | Discharge: 2022-10-14 | Disposition: A | Discharge status: Home / Self Care | Payer: No Typology Code available for payment source | Source: Ambulatory Visit | Attending: Vascular Surgery | Admitting: Vascular Surgery

## 2022-10-14 ENCOUNTER — Ambulatory Visit (INDEPENDENT_AMBULATORY_CARE_PROVIDER_SITE_OTHER)
Admission: RE | Admit: 2022-10-14 | Discharge: 2022-10-14 | Disposition: A | Discharge status: Home / Self Care | Payer: No Typology Code available for payment source | Source: Ambulatory Visit | Attending: Vascular Surgery | Admitting: Vascular Surgery

## 2022-10-14 ENCOUNTER — Telehealth: Payer: Self-pay

## 2022-10-14 ENCOUNTER — Ambulatory Visit (INDEPENDENT_AMBULATORY_CARE_PROVIDER_SITE_OTHER): Payer: No Typology Code available for payment source | Admitting: Vascular Surgery

## 2022-10-14 VITALS — BP 124/70 | HR 79 | Temp 98.1°F | Resp 16 | Ht 71.0 in | Wt 142.0 lb

## 2022-10-14 DIAGNOSIS — Z992 Dependence on renal dialysis: Secondary | ICD-10-CM

## 2022-10-14 DIAGNOSIS — N186 End stage renal disease: Secondary | ICD-10-CM

## 2022-10-14 NOTE — Progress Notes (Signed)
Patient name: Ronald Reeves MRN: [id number] DOB: [birth year] Sex: male  REASON FOR CONSULT: Discuss new permanent hemodialysis access.  HPI: Ronald Reeves is a 67 y.o. male, with ESRD that presents for evaluation of new permanent hemodialysis access.  Patient previously had a left radiocephalic fistula that required sidebranch ligation ultimately converted to a left brachiobasilic fistula as recently as 01/27/22.Marland Kitchen  Ultimately this worked well until he had a traumatic cannulation on 05/01/2022 requiring hematoma evacuation and repair of the fistula.    Patient now has a left IJ TDC.  He states that the fistula in his left arm stopped working in mid January.  Now using his catheter.   Past Medical History:  Diagnosis Date   Anemia    Arthritis    Back pain    Bronchitis    Chronic kidney disease    Coronary artery disease    GERD (gastroesophageal reflux disease)    not a current problem   Heart murmur    Hypertension    Myocardial infarction ([place])    Pre-diabetes    does not have CBG machine.   Substance abuse [facility])     Past Surgical History:  Procedure Laterality Date   AV FISTULA PLACEMENT Left 04/12/2021   Procedure: LEFT ARM ARTERIOVENOUS (AV) FISTULA CREATION;  Surgeon: Marty Heck, MD;  Location: [place];  Service: Vascular;  Laterality: Left;   AV FISTULA PLACEMENT Left 10/18/2021   Procedure: LEFT ARM BRACHIOBASILIC FISTULA CREATION FIRST STAGE;  Surgeon: Marty Heck, MD;  Location: [place];  Service: Vascular;  Laterality: Left;   [place] Left 01/27/2022   Procedure: LEFT SECOND STAGE BASILIC VEIN TRANSPOSITION;  Surgeon: Marty Heck, MD;  Location: [place];  Service: Vascular;  Laterality: Left;   BIOPSY  08/20/2022   Procedure: BIOPSY;  Surgeon: Lavena Bullion, DO;  Location: [facility];  Service: Gastroenterology;;   COLONOSCOPY WITH PROPOFOL N/A 08/20/2022   Procedure: COLONOSCOPY WITH PROPOFOL;  Surgeon:  Lavena Bullion, DO;  [facility];  Service: Gastroenterology;  Laterality: N/A;   ESOPHAGOGASTRODUODENOSCOPY (EGD) WITH PROPOFOL N/A 08/20/2022   Procedure: ESOPHAGOGASTRODUODENOSCOPY (EGD) WITH PROPOFOL;  Surgeon: Lavena Bullion, DO;  [facility];  Service: Gastroenterology;  Laterality: N/A;   INSERTION OF DIALYSIS CATHETER Right 05/01/2022   Procedure: INSERTION OF DIALYSIS CATHETER;  Surgeon: [name], MD;  Location: [place];  Service: Vascular;  Laterality: Right;   LIGATION OF COMPETING BRANCHES OF ARTERIOVENOUS FISTULA Left 07/15/2021   Procedure: LIGATION OF COMPETING BRANCHES OF LEFT RADIOCEPHALIC ARTERIOVENOUS FISTULA TIMES TWO;  Surgeon: Marty Heck, MD;  Location: [place];  Service: Vascular;  Laterality: Left;   POLYPECTOMY  08/20/2022   Procedure: POLYPECTOMY;  Surgeon: Lavena Bullion, DO;  [facility];  Service: Gastroenterology;;   REVISON OF ARTERIOVENOUS FISTULA Left 07/15/2021   Procedure: REVISON OF LEFT ARTERIOVENOUS FISTULA;  Surgeon: Marty Heck, MD;  Location: [place];  Service: Vascular;  Laterality: Left;   REVISON OF ARTERIOVENOUS FISTULA Left 05/01/2022   Procedure: ARTERIOVENOUS FISTULA WASHOUT OF ARM HEMATOMA;  Surgeon: [name], MD;  [facility];  Service: Vascular;  Laterality: Left;    Family History  Problem Relation Age of Onset   Heart disease Mother    Heart attack Sister 70    SOCIAL HISTORY: Social History   Socioeconomic History   Marital status: Married    Spouse name: Not on file  Number of children: Not on file   Years of education: Not on file   Highest education level: Not on file  Occupational History   Not on file  Tobacco Use   Smoking status: Every Day    Types: Cigars   Smokeless tobacco: Current   Tobacco comments:    3 black and mild a day  Vaping Use   Vaping Use: Never used  Substance and Sexual Activity   Alcohol use: Yes    Alcohol/week: 14.0  standard drinks of alcohol    Types: 14 Cans of beer per week    Comment: Daily   Drug use: Yes    Frequency: 1.0 times per week    Types: Marijuana, Cocaine    Comment: marijuana daily   Sexual activity: Yes  Other Topics Concern   Not on file  Social History Narrative   Not on file   Social Determinants of Health   Financial Resource Strain: Not on file  Food Insecurity: Not on file  Transportation Needs: Not on file  Physical Activity: Not on file  Stress: Not on file  Social Connections: Not on file  Intimate Partner Violence: Not on file    No Known Allergies  Current Outpatient Medications  Medication Sig Dispense Refill   albuterol (VENTOLIN HFA) 108 (90 Base) MCG/ACT inhaler Inhale 2 puffs into the lungs every 6 (six) hours as needed for wheezing or shortness of breath.     amLODipine (NORVASC) 10 MG tablet Take 1 tablet (10 mg total) by mouth daily. 90 tablet 3   aspirin EC 81 MG tablet Take 81 mg by mouth daily.     calcitRIOL (ROCALTROL) 0.5 MCG capsule Take 0.5 mcg by mouth daily.     diclofenac Sodium (VOLTAREN) 1 % GEL Apply 2 g topically 3 (three) times daily as needed (to affected areas for pain).     doxycycline (VIBRAMYCIN) 100 MG capsule Take 1 capsule (100 mg total) by mouth 2 (two) times daily. 20 capsule 0   Ergocalciferol (VITAMIN D2 PO) Take by mouth.     furosemide (LASIX) 80 MG tablet Take 80 mg by mouth daily.     hydrALAZINE (APRESOLINE) 25 MG tablet Take 2 tablets (50 mg total) by mouth every morning. (Patient taking differently: Take 25 mg by mouth 2 (two) times daily.) 180 tablet 3   isosorbide mononitrate (IMDUR) 30 MG 24 hr tablet Take 1 tablet (30 mg total) by mouth daily. 90 tablet 3   labetalol (NORMODYNE) 100 MG tablet Take 2 tablets (200 mg total) by mouth every morning. (Patient taking differently: Take 100 mg by mouth 2 (two) times daily.) 180 tablet 3   multivitamin (RENA-VIT) TABS tablet Take 1 tablet by mouth daily.     Nutritional  Supplements (ENSURE CLEAR) LIQD Take 1 Bottle by mouth daily.     omeprazole (PRILOSEC) 40 MG capsule Take 1 capsule (40 mg total) by mouth daily. 30 capsule 2   rosuvastatin (CRESTOR) 40 MG tablet Take 40 mg by mouth every other day.     tamsulosin (FLOMAX) 0.4 MG CAPS capsule Take 0.8 mg by mouth in the morning.     predniSONE (DELTASONE) 50 MG tablet Take 1 tablet (50 mg total) by mouth daily. 5 tablet 0   No current facility-administered medications for this visit.    REVIEW OF SYSTEMS:  '[X]'$  denotes positive finding, '[ ]'$  denotes negative finding Cardiac  Comments:  Chest pain or chest pressure:    Shortness of breath  upon exertion:    Short of breath when lying flat:    Irregular heart rhythm:        Vascular    Pain in calf, thigh, or hip brought on by ambulation:    Pain in feet at night that wakes you up from your sleep:     Blood clot in your veins:    Leg swelling:         Pulmonary    Oxygen at home:    Productive cough:     Wheezing:         Neurologic    Sudden weakness in arms or legs:     Sudden numbness in arms or legs:     Sudden onset of difficulty speaking or slurred speech:    Temporary loss of vision in one eye:     Problems with dizziness:         Gastrointestinal    Blood in stool:     Vomited blood:         Genitourinary    Burning when urinating:     Blood in urine:        Psychiatric    Major depression:         Hematologic    Bleeding problems:    Problems with blood clotting too easily:        Skin    Rashes or ulcers:        Constitutional    Fever or chills:      PHYSICAL EXAM: Vitals:   10/14/22 0902  BP: 124/70  Pulse: 79  Resp: 16  Temp: 98.1 F (36.7 C)  TempSrc: Temporal  SpO2: 96%  Weight: 142 lb (64.4 kg)  Height: '5\' 11"'$  (1.803 m)    GENERAL: The patient is a well-nourished male, in no acute distress. The vital signs are documented above. CARDIAC: There is a regular rate and rhythm.  VASCULAR:  Palpable  radial and brachial pulses bilateral upper extremities Left arm basilic vein fistula thrombosed with no thrill Left IJ TDC PULMONARY: No respiratory distress. ABDOMEN: Soft and non-tender. MUSCULOSKELETAL: There are no major deformities or cyanosis. NEUROLOGIC: No focal weakness or paresthesias are detected. PSYCHIATRIC: The patient has a normal affect.  DATA:   Upper extremity arterial duplex shows triphasic waveforms of both upper extremities  Vein mapping shows no usable surface vein in the left arm with a small cephalic on the right and a better basilic  Assessment/Plan:  67 year old male with end-stage renal disease on hemodialysis Tuesday Thursday Saturday that presents to discuss new permanent dialysis access.  He has previously had a left radiocephalic that failed and then had a left arm basilic vein fistula that worked until January of this year when it thrombosed.  We discussed options of an AV graft in the left arm versus going to his right arm where he has at least a usable basilic vein.  I did look at his axilla with ultrasound today and his brachial veins in the upper left arm appear very small.  As a result I think going to his right arm may be a better option particularly with his catheter on the left.  I discussed right arm AV fistula versus graft.  He wants to be scheduled on a Friday.  Risks-benefit is discussed.   Marty Heck, MD [facility] Office: [phone]

## 2022-10-14 NOTE — Telephone Encounter (Signed)
Attempted to reach pt to schedule his surgery. LVM for him to return our call.

## 2022-10-15 ENCOUNTER — Other Ambulatory Visit: Payer: Self-pay

## 2022-10-15 DIAGNOSIS — N186 End stage renal disease: Secondary | ICD-10-CM

## 2022-10-22 NOTE — Progress Notes (Signed)
This Probation officer called patient multiple times with questions and instructions for the surgery day with no success. LVM for him to return our call.

## 2022-10-23 ENCOUNTER — Encounter (HOSPITAL_COMMUNITY): Payer: Self-pay | Admitting: Emergency Medicine

## 2022-10-23 ENCOUNTER — Telehealth: Payer: Self-pay

## 2022-10-23 NOTE — Telephone Encounter (Signed)
Spoke with Lelon Frohlich at [facility] regarding cancellation for surgery on tomorrow. She requested to reschedule patient and provide him with information while he is currently at dialysis center. Surgery rescheduled for April 12. Instructions reviewed and she verbalized understanding.

## 2022-10-23 NOTE — Progress Notes (Signed)
Anesthesia Chart Review:  Pt is a same day work up   Case: [id number] Date/Time: 10/24/22 0915   Procedure: RIGHT ARM ARTERIOVENOUS (AV) FISTULA VERSUS ARTERIOVENOUS GRAFT CREATION (Right)   Anesthesia type: Choice   Pre-op diagnosis: ESRD   Location: [facility] OR ROOM 11 / [place] OR   Surgeons: Marty Heck, MD       DISCUSSION: Pt is 67 years old with hx CAD (2019 nuclear study shows large scar), HTN, ESRD (currently on HD via L IJ TDC)  CXR 09/20/22 showed pneumonia, treated with doxycycline  Given recent pneumonia, pt should wait 8 weeks from diagnosis before surgery. I notified Kea in Dr. Ainsley Spinner office.   PROVIDERS: - PCP is Gerome Sam, MD - Saw cardiology for chest pain in 2019, last office visit 01/01/18 with Kirk Ruths, MD - "nuclear study showed infarct but no ischemia. We have elected to treat medically. I am concerned about his ongoing substance abuse. He also has a history of noncompliance with medications intermittently." I do not see subsequent f/u with cardiology  LABS: Will be obtained day of surgery    IMAGES: CXR 09/20/22:  - Retrocardiac airspace opacity. Followup PA and lateral chest X-ray is recommended in 3-4 weeks following therapy to ensure resolution.   EKG 09/21/22: Sinus rhythm LAE, consider biatrial enlargement LVH with secondary repolarization abnormality Anterior infarct, old  CV: Nuclear stress test 08/21/17:  1. Large area of scarring in the inferior wall consistent with previous myocardial infarction. No definite reversible components to suggest pharmacologically induced myocardial ischemia. 2. Septal and inferior wall hypokinesis. 3. Left ventricular ejection fraction 38% 4. Non invasive risk stratification*: High  Echo 08/21/17:  - Left ventricle: The cavity size was normal. There was moderate concentric hypertrophy. Systolic function was mildly reduced. The estimated ejection fraction was in the range of 45% to 50%. There is  hypokinesis of the basal-midinferolateral and inferior myocardium. The study is not technically sufficient to allow evaluation of LV diastolic function.  - Aortic valve: There was mild regurgitation.  - Mitral valve: There was trivial regurgitation.  - Left atrium: The atrium was mildly dilated.  - Pulmonic valve: There was trivial regurgitation.  (Note: Dr. Lelon Perla note 01/01/18 documents EF felt to be 50-55%, not low as reported above)   Past Medical History:  Diagnosis Date   Anemia    Arthritis    Back pain    Bronchitis    Chronic kidney disease    Coronary artery disease    GERD (gastroesophageal reflux disease)    not a current problem   Heart murmur    Hypertension    Myocardial infarction ([place])    Pre-diabetes    does not have CBG machine.   Substance abuse [facility])     Past Surgical History:  Procedure Laterality Date   AV FISTULA PLACEMENT Left 04/12/2021   Procedure: LEFT ARM ARTERIOVENOUS (AV) FISTULA CREATION;  Surgeon: Marty Heck, MD;  Location: [place];  Service: Vascular;  Laterality: Left;   AV FISTULA PLACEMENT Left 10/18/2021   Procedure: LEFT ARM BRACHIOBASILIC FISTULA CREATION FIRST STAGE;  Surgeon: Marty Heck, MD;  Location: [place];  Service: Vascular;  Laterality: Left;   [place] Left 01/27/2022   Procedure: LEFT SECOND STAGE BASILIC VEIN TRANSPOSITION;  Surgeon: Marty Heck, MD;  Location: [place];  Service: Vascular;  Laterality: Left;   BIOPSY  08/20/2022   Procedure: BIOPSY;  Surgeon: Lavena Bullion,  DO;  [facility];  Service: Gastroenterology;;   COLONOSCOPY WITH PROPOFOL N/A 08/20/2022   Procedure: COLONOSCOPY WITH PROPOFOL;  Surgeon: Lavena Bullion, DO;  [facility];  Service: Gastroenterology;  Laterality: N/A;   ESOPHAGOGASTRODUODENOSCOPY (EGD) WITH PROPOFOL N/A 08/20/2022   Procedure: ESOPHAGOGASTRODUODENOSCOPY (EGD) WITH PROPOFOL;  Surgeon: Lavena Bullion, DO;  [facility];  Service: Gastroenterology;  Laterality: N/A;   INSERTION OF DIALYSIS CATHETER Right 05/01/2022   Procedure: INSERTION OF DIALYSIS CATHETER;  Surgeon: [name], MD;  Location: [place];  Service: Vascular;  Laterality: Right;   LIGATION OF COMPETING BRANCHES OF ARTERIOVENOUS FISTULA Left 07/15/2021   Procedure: LIGATION OF COMPETING BRANCHES OF LEFT RADIOCEPHALIC ARTERIOVENOUS FISTULA TIMES TWO;  Surgeon: Marty Heck, MD;  Location: [place];  Service: Vascular;  Laterality: Left;   POLYPECTOMY  08/20/2022   Procedure: POLYPECTOMY;  Surgeon: Lavena Bullion, DO;  [facility];  Service: Gastroenterology;;   REVISON OF ARTERIOVENOUS FISTULA Left 07/15/2021   Procedure: REVISON OF LEFT ARTERIOVENOUS FISTULA;  Surgeon: Marty Heck, MD;  Location: [place];  Service: Vascular;  Laterality: Left;   REVISON OF ARTERIOVENOUS FISTULA Left 05/01/2022   Procedure: ARTERIOVENOUS FISTULA WASHOUT OF ARM HEMATOMA;  Surgeon: [name], MD;  [facility];  Service: Vascular;  Laterality: Left;    MEDICATIONS: No current facility-administered medications for this encounter.    albuterol (VENTOLIN HFA) 108 (90 Base) MCG/ACT inhaler   amLODipine (NORVASC) 10 MG tablet   aspirin EC 81 MG tablet   calcitRIOL (ROCALTROL) 0.5 MCG capsule   diclofenac Sodium (VOLTAREN) 1 % GEL   doxycycline (VIBRAMYCIN) 100 MG capsule   Ergocalciferol (VITAMIN D2 PO)   furosemide (LASIX) 80 MG tablet   hydrALAZINE (APRESOLINE) 25 MG tablet   isosorbide mononitrate (IMDUR) 30 MG 24 hr tablet   labetalol (NORMODYNE) 100 MG tablet   multivitamin (RENA-VIT) TABS tablet   Nutritional Supplements (ENSURE CLEAR) LIQD   omeprazole (PRILOSEC) 40 MG capsule   predniSONE (DELTASONE) 50 MG tablet   rosuvastatin (CRESTOR) 40 MG tablet   tamsulosin (FLOMAX) 0.4 MG CAPS capsule    Willeen Cass, PhD, FNP-BC [facility]/Anesthesiology Phone:  [phone] 10/23/2022 12:22 PM

## 2022-10-23 NOTE — Telephone Encounter (Signed)
Received notification from anesthesia dept stating because pt was diagnosed with pneumonia 09/20/22 and per new anesthesia criteria, needs to wait 8 weeks from pneumonia diagnosis before having surgery. Exceptions are made for emergencies but it doesn't look like this surgery is emergent. They are requesting I push him back until 4/8 at the earliest for surgery.  Attempted to reach patient at both numbers listed and left vm to return call.

## 2022-11-19 ENCOUNTER — Encounter (HOSPITAL_COMMUNITY): Payer: Self-pay | Admitting: Vascular Surgery

## 2022-11-19 NOTE — Progress Notes (Signed)
PCP - [facility] Cardiologist - [facility]  Chest x-ray - 09/20/22  EKG - 09/21/22 Stress Test - 08/21/17 ECHO - 08/21/17 Cardiac Cath - n/a  ICD Pacemaker/Loop - n/a  Sleep Study -  n/a CPAP - none  Diabetes - n/a  Blood Thinner Instructions:  n/a  NPO  Anesthesia review: Yes  STOP now taking Aleve, Naproxen, Ibuprofen, Motrin, Advil, Goody's, BC's, all herbal medications, fish oil, and all vitamins.   Coronavirus Screening Do you have any of the following symptoms:  Cough yes/no: No Fever (>100.42F)  yes/no: No Runny nose yes/no: No Sore throat yes/no: No Difficulty breathing/shortness of breath  Occasional   Have you traveled in the last 14 days and where? yes/no: No  Patient verbalized understanding of instructions that were given via phone.

## 2022-11-20 ENCOUNTER — Telehealth: Payer: Self-pay

## 2022-11-20 NOTE — Telephone Encounter (Signed)
Attempted to reach patient multiple times at numbers listed in chart. Contacted dialysis center and told patient just left for the day.   Contacted patient's wife, [name]. Informed her that surgery would need to be canceled for now due until new authorization from the [place] has been received. She expressed her frustration regarding this, but stated she will inform patient.

## 2022-11-20 NOTE — Telephone Encounter (Signed)
Per [facility], Pt does not have an active authorization on file.  He was approved for surgery but had to postpone due to illness.  Now he is outside of his auth window (05/22/22 - 11/19/22).).  Auth will not be obtained by new surgery date (tomorrow).  We will call and notify patient.

## 2022-11-21 ENCOUNTER — Ambulatory Visit (HOSPITAL_COMMUNITY)
Admission: RE | Admit: 2022-11-21 | Payer: No Typology Code available for payment source | Source: Home / Self Care | Admitting: Vascular Surgery

## 2022-11-21 HISTORY — DX: Pneumonia, unspecified organism: J18.9

## 2022-11-21 HISTORY — DX: Dyspnea, unspecified: R06.00

## 2022-11-21 SURGERY — ARTERIOVENOUS (AV) FISTULA CREATION
Anesthesia: Choice | Laterality: Right

## 2022-11-24 NOTE — Telephone Encounter (Signed)
Patient returned call. Surgery rescheduled for 4/19. Instructions reviewed and he verbalized understanding.

## 2022-11-24 NOTE — Telephone Encounter (Signed)
Per Robet Leu, LPN, patient says he wants to use MCR A/B for surgery.   Attempted to reach patient to reschedule surgery, no answer. Left VM message on cell number. Unable to LVM on home number.

## 2022-11-27 ENCOUNTER — Other Ambulatory Visit: Payer: Self-pay

## 2022-11-27 ENCOUNTER — Encounter (HOSPITAL_COMMUNITY): Payer: Self-pay | Admitting: Vascular Surgery

## 2022-11-27 NOTE — Progress Notes (Signed)
Spoke with pt for pre-op call. Pt has hx of CAD with MI. Pt denies any recent chest pain. Pt states he is pre-diabetic.   Shower instructions given to pt.

## 2022-11-27 NOTE — Anesthesia Preprocedure Evaluation (Addendum)
Anesthesia Evaluation  Patient identified by MRN, date of birth, ID band Patient awake    Reviewed: Allergy & Precautions, NPO status , Patient's Chart, lab work & pertinent test results, reviewed documented beta blocker date and time   History of Anesthesia Complications Negative for: history of anesthetic complications  Airway Mallampati: II  TM Distance: >3 FB Neck ROM: Full    Dental  (+) Dental Advisory Given   Pulmonary COPD,  COPD inhaler, Current Smoker and Patient abstained from smoking.   Pulmonary exam normal        Cardiovascular hypertension, Pt. on medications and Pt. on home beta blockers + CAD and + Past MI  Normal cardiovascular exam     Neuro/Psych  PSYCHIATRIC DISORDERS  Depression     Neuromuscular disease    GI/Hepatic ,GERD  Medicated and Controlled,,(+)     substance abuse  alcohol use, cocaine use and marijuana use  Endo/Other   Pre-DM   Renal/GU ESRF and DialysisRenal disease     Musculoskeletal  (+) Arthritis ,    Abdominal   Peds  Hematology  (+) Blood dyscrasia, anemia   Anesthesia Other Findings   Reproductive/Obstetrics                             Anesthesia Physical Anesthesia Plan  ASA: 4  Anesthesia Plan: General   Post-op Pain Management: Tylenol PO (pre-op)* and Regional block*   Induction: Intravenous  PONV Risk Score and Plan: 1 and Treatment may vary due to age or medical condition, Ondansetron and Dexamethasone  Airway Management Planned: LMA  Additional Equipment: None  Intra-op Plan:   Post-operative Plan: Extubation in OR  Informed Consent: I have reviewed the patients History and Physical, chart, labs and discussed the procedure including the risks, benefits and alternatives for the proposed anesthesia with the patient or authorized representative who has indicated his/her understanding and acceptance.     Dental advisory  given  Plan Discussed with: CRNA and Anesthesiologist  Anesthesia Plan Comments:         Anesthesia Quick Evaluation

## 2022-11-28 ENCOUNTER — Other Ambulatory Visit: Payer: Self-pay

## 2022-11-28 ENCOUNTER — Ambulatory Visit (HOSPITAL_COMMUNITY)
Admission: RE | Admit: 2022-11-28 | Discharge: 2022-11-28 | Disposition: A | Discharge status: Home / Self Care | Payer: No Typology Code available for payment source | Attending: Vascular Surgery | Admitting: Vascular Surgery

## 2022-11-28 ENCOUNTER — Encounter (HOSPITAL_COMMUNITY)
Admission: RE | Disposition: A | Discharge status: Home / Self Care | Payer: Self-pay | Source: Home / Self Care | Attending: Vascular Surgery

## 2022-11-28 ENCOUNTER — Ambulatory Visit (HOSPITAL_BASED_OUTPATIENT_CLINIC_OR_DEPARTMENT_OTHER): Payer: No Typology Code available for payment source | Admitting: Certified Registered Nurse Anesthetist

## 2022-11-28 ENCOUNTER — Ambulatory Visit (HOSPITAL_COMMUNITY): Payer: No Typology Code available for payment source | Admitting: Certified Registered Nurse Anesthetist

## 2022-11-28 ENCOUNTER — Other Ambulatory Visit (HOSPITAL_COMMUNITY): Payer: Self-pay

## 2022-11-28 ENCOUNTER — Encounter (HOSPITAL_COMMUNITY): Payer: Self-pay | Admitting: Vascular Surgery

## 2022-11-28 DIAGNOSIS — D759 Disease of blood and blood-forming organs, unspecified: Secondary | ICD-10-CM | POA: Diagnosis not present

## 2022-11-28 DIAGNOSIS — M1990 Unspecified osteoarthritis, unspecified site: Secondary | ICD-10-CM | POA: Insufficient documentation

## 2022-11-28 DIAGNOSIS — I252 Old myocardial infarction: Secondary | ICD-10-CM | POA: Diagnosis not present

## 2022-11-28 DIAGNOSIS — N186 End stage renal disease: Secondary | ICD-10-CM

## 2022-11-28 DIAGNOSIS — Z992 Dependence on renal dialysis: Secondary | ICD-10-CM | POA: Insufficient documentation

## 2022-11-28 DIAGNOSIS — N185 Chronic kidney disease, stage 5: Secondary | ICD-10-CM | POA: Diagnosis not present

## 2022-11-28 DIAGNOSIS — I2510 Atherosclerotic heart disease of native coronary artery without angina pectoris: Secondary | ICD-10-CM | POA: Diagnosis not present

## 2022-11-28 DIAGNOSIS — I120 Hypertensive chronic kidney disease with stage 5 chronic kidney disease or end stage renal disease: Secondary | ICD-10-CM | POA: Diagnosis not present

## 2022-11-28 DIAGNOSIS — R7303 Prediabetes: Secondary | ICD-10-CM | POA: Insufficient documentation

## 2022-11-28 DIAGNOSIS — D631 Anemia in chronic kidney disease: Secondary | ICD-10-CM | POA: Diagnosis not present

## 2022-11-28 DIAGNOSIS — D649 Anemia, unspecified: Secondary | ICD-10-CM | POA: Insufficient documentation

## 2022-11-28 DIAGNOSIS — K219 Gastro-esophageal reflux disease without esophagitis: Secondary | ICD-10-CM | POA: Diagnosis not present

## 2022-11-28 DIAGNOSIS — F17290 Nicotine dependence, other tobacco product, uncomplicated: Secondary | ICD-10-CM | POA: Insufficient documentation

## 2022-11-28 DIAGNOSIS — F17210 Nicotine dependence, cigarettes, uncomplicated: Secondary | ICD-10-CM

## 2022-11-28 HISTORY — PX: AV FISTULA PLACEMENT: SHX1204

## 2022-11-28 HISTORY — DX: Chronic obstructive pulmonary disease, unspecified: J44.9

## 2022-11-28 HISTORY — DX: COVID-19: U07.1

## 2022-11-28 LAB — POCT I-STAT, CHEM 8
BUN: 35 mg/dL — ABNORMAL HIGH (ref 8–23)
Calcium, Ion: 1.11 mmol/L — ABNORMAL LOW (ref 1.15–1.40)
Chloride: 106 mmol/L (ref 98–111)
Creatinine, Ser: 6.8 mg/dL — ABNORMAL HIGH (ref 0.61–1.24)
Glucose, Bld: 93 mg/dL (ref 70–99)
HCT: 48 % (ref 39.0–52.0)
Hemoglobin: 16.3 g/dL (ref 13.0–17.0)
Potassium: 4.2 mmol/L (ref 3.5–5.1)
Sodium: 139 mmol/L (ref 135–145)
TCO2: 25 mmol/L (ref 22–32)

## 2022-11-28 SURGERY — ARTERIOVENOUS (AV) FISTULA CREATION
Anesthesia: General | Site: Arm Upper | Laterality: Right

## 2022-11-28 MED ORDER — CHLORHEXIDINE GLUCONATE 4 % EX LIQD: 60.0000 mL | Freq: Once | CUTANEOUS | Status: DC

## 2022-11-28 MED ORDER — PHENYLEPHRINE HCL-NACL 20-0.9 MG/250ML-% IV SOLN
INTRAVENOUS | Status: DC | PRN
  Administered 2022-11-28: 25 ug/min via INTRAVENOUS

## 2022-11-28 MED ORDER — 0.9 % SODIUM CHLORIDE (POUR BTL) OPTIME
TOPICAL | Status: DC | PRN
  Administered 2022-11-28: 1000 mL

## 2022-11-28 MED ORDER — EPHEDRINE 5 MG/ML INJ
INTRAVENOUS | Status: AC
  Filled 2022-11-28: qty 5

## 2022-11-28 MED ORDER — LIDOCAINE 2% (20 MG/ML) 5 ML SYRINGE
INTRAMUSCULAR | Status: DC | PRN
  Administered 2022-11-28: 40 mg via INTRAVENOUS

## 2022-11-28 MED ORDER — HEPARIN 6000 UNIT IRRIGATION SOLUTION
Status: AC
  Filled 2022-11-28: qty 500

## 2022-11-28 MED ORDER — CHLORHEXIDINE GLUCONATE 4 % EX LIQD
60.0000 mL | Freq: Once | CUTANEOUS | Status: DC
  Filled 2022-11-28: qty 60

## 2022-11-28 MED ORDER — CHLORHEXIDINE GLUCONATE 0.12 % MT SOLN
15.0000 mL | Freq: Once | OROMUCOSAL | Status: AC
  Administered 2022-11-28: 15 mL via OROMUCOSAL
  Filled 2022-11-28: qty 15

## 2022-11-28 MED ORDER — LIDOCAINE 2% (20 MG/ML) 5 ML SYRINGE
INTRAMUSCULAR | Status: AC
  Filled 2022-11-28: qty 5

## 2022-11-28 MED ORDER — LIDOCAINE HCL (PF) 1 % IJ SOLN
INTRAMUSCULAR | Status: AC
  Filled 2022-11-28: qty 30

## 2022-11-28 MED ORDER — HEPARIN SODIUM (PORCINE) 1000 UNIT/ML IJ SOLN
INTRAMUSCULAR | Status: AC
  Filled 2022-11-28: qty 10

## 2022-11-28 MED ORDER — FENTANYL CITRATE (PF) 250 MCG/5ML IJ SOLN
INTRAMUSCULAR | Status: AC
  Filled 2022-11-28: qty 5

## 2022-11-28 MED ORDER — DEXAMETHASONE SODIUM PHOSPHATE 10 MG/ML IJ SOLN
INTRAMUSCULAR | Status: AC
  Filled 2022-11-28: qty 1

## 2022-11-28 MED ORDER — HEPARIN 6000 UNIT IRRIGATION SOLUTION
Status: DC | PRN
  Administered 2022-11-28: 1

## 2022-11-28 MED ORDER — FENTANYL CITRATE (PF) 250 MCG/5ML IJ SOLN
INTRAMUSCULAR | Status: DC | PRN
  Administered 2022-11-28 (×2): 25 ug via INTRAVENOUS

## 2022-11-28 MED ORDER — SODIUM CHLORIDE 0.9 % IV SOLN: INTRAVENOUS | Status: DC

## 2022-11-28 MED ORDER — PHENYLEPHRINE 80 MCG/ML (10ML) SYRINGE FOR IV PUSH (FOR BLOOD PRESSURE SUPPORT)
PREFILLED_SYRINGE | INTRAVENOUS | Status: AC
  Filled 2022-11-28: qty 10

## 2022-11-28 MED ORDER — MIDAZOLAM HCL 2 MG/2ML IJ SOLN
INTRAMUSCULAR | Status: AC
  Filled 2022-11-28: qty 2

## 2022-11-28 MED ORDER — DEXAMETHASONE SODIUM PHOSPHATE 10 MG/ML IJ SOLN
INTRAMUSCULAR | Status: DC | PRN
  Administered 2022-11-28: 10 mg via INTRAVENOUS

## 2022-11-28 MED ORDER — HEPARIN SODIUM (PORCINE) 1000 UNIT/ML IJ SOLN
INTRAMUSCULAR | Status: DC | PRN
  Administered 2022-11-28: 3000 [IU] via INTRAVENOUS

## 2022-11-28 MED ORDER — PHENYLEPHRINE 80 MCG/ML (10ML) SYRINGE FOR IV PUSH (FOR BLOOD PRESSURE SUPPORT)
PREFILLED_SYRINGE | INTRAVENOUS | Status: DC | PRN
  Administered 2022-11-28: 160 ug via INTRAVENOUS
  Administered 2022-11-28 (×2): 80 ug via INTRAVENOUS
  Administered 2022-11-28: 160 ug via INTRAVENOUS
  Administered 2022-11-28: 80 ug via INTRAVENOUS
  Administered 2022-11-28: 160 ug via INTRAVENOUS
  Administered 2022-11-28: 80 ug via INTRAVENOUS

## 2022-11-28 MED ORDER — OXYCODONE HCL 5 MG PO TABS
ORAL_TABLET | ORAL | Status: AC
  Filled 2022-11-28: qty 1

## 2022-11-28 MED ORDER — PROPOFOL 10 MG/ML IV BOLUS
INTRAVENOUS | Status: DC | PRN
  Administered 2022-11-28: 140 mg via INTRAVENOUS

## 2022-11-28 MED ORDER — ALBUMIN HUMAN 5 % IV SOLN: INTRAVENOUS | Status: DC | PRN

## 2022-11-28 MED ORDER — MIDAZOLAM HCL 2 MG/2ML IJ SOLN
INTRAMUSCULAR | Status: DC | PRN
  Administered 2022-11-28: 1 mg via INTRAVENOUS

## 2022-11-28 MED ORDER — ONDANSETRON HCL 4 MG/2ML IJ SOLN
INTRAMUSCULAR | Status: DC | PRN
  Administered 2022-11-28: 4 mg via INTRAVENOUS

## 2022-11-28 MED ORDER — CEFAZOLIN SODIUM-DEXTROSE 2-4 GM/100ML-% IV SOLN
2.0000 g | INTRAVENOUS | Status: AC
  Administered 2022-11-28: 2 g via INTRAVENOUS
  Filled 2022-11-28: qty 100

## 2022-11-28 MED ORDER — ONDANSETRON HCL 4 MG/2ML IJ SOLN
INTRAMUSCULAR | Status: AC
  Filled 2022-11-28: qty 2

## 2022-11-28 MED ORDER — OXYCODONE HCL 5 MG PO TABS
5.0000 mg | ORAL_TABLET | Freq: Once | ORAL | Status: AC
  Administered 2022-11-28: 5 mg via ORAL

## 2022-11-28 MED ORDER — OXYCODONE-ACETAMINOPHEN 5-325 MG PO TABS
1.0000 | ORAL_TABLET | Freq: Four times a day (QID) | ORAL | 0 refills | Status: DC | PRN
  Filled 2022-11-28: qty 8, 2d supply, fill #0

## 2022-11-28 MED ORDER — EPHEDRINE SULFATE-NACL 50-0.9 MG/10ML-% IV SOSY
PREFILLED_SYRINGE | INTRAVENOUS | Status: DC | PRN
  Administered 2022-11-28 (×2): 10 mg via INTRAVENOUS
  Administered 2022-11-28: 5 mg via INTRAVENOUS

## 2022-11-28 MED ORDER — PROPOFOL 10 MG/ML IV BOLUS
INTRAVENOUS | Status: AC
  Filled 2022-11-28: qty 20

## 2022-11-28 MED ORDER — ORAL CARE MOUTH RINSE: 15.0000 mL | Freq: Once | OROMUCOSAL | Status: AC

## 2022-11-28 MED ORDER — LACTATED RINGERS IV SOLN: INTRAVENOUS | Status: DC

## 2022-11-28 MED ORDER — FENTANYL CITRATE (PF) 100 MCG/2ML IJ SOLN
INTRAMUSCULAR | Status: AC
  Filled 2022-11-28: qty 2

## 2022-11-28 SURGICAL SUPPLY — 38 items
ADH SKN CLS APL DERMABOND .7 (GAUZE/BANDAGES/DRESSINGS) ×1
AGENT HMST SPONGE THK3/8 (HEMOSTASIS)
ARMBAND PINK RESTRICT EXTREMIT (MISCELLANEOUS) ×2 IMPLANT
BAG COUNTER SPONGE SURGICOUNT (BAG) ×1 IMPLANT
BAG SPNG CNTER NS LX DISP (BAG) ×1
BLADE CLIPPER SURG (BLADE) ×1 IMPLANT
CANISTER SUCT 3000ML PPV (MISCELLANEOUS) ×1 IMPLANT
CLIP TI MEDIUM 6 (CLIP) ×1 IMPLANT
CLIP TI WIDE RED SMALL 6 (CLIP) ×1 IMPLANT
COVER PROBE W GEL 5X96 (DRAPES) ×1 IMPLANT
DERMABOND ADVANCED .7 DNX12 (GAUZE/BANDAGES/DRESSINGS) ×1 IMPLANT
ELECT REM PT RETURN 9FT ADLT (ELECTROSURGICAL) ×1
ELECTRODE REM PT RTRN 9FT ADLT (ELECTROSURGICAL) ×1 IMPLANT
GLOVE BIO SURGEON STRL SZ7.5 (GLOVE) ×1 IMPLANT
GLOVE BIOGEL PI IND STRL 8 (GLOVE) ×1 IMPLANT
GOWN STRL REUS W/ TWL LRG LVL3 (GOWN DISPOSABLE) ×2 IMPLANT
GOWN STRL REUS W/ TWL XL LVL3 (GOWN DISPOSABLE) ×2 IMPLANT
GOWN STRL REUS W/TWL LRG LVL3 (GOWN DISPOSABLE) ×2
GOWN STRL REUS W/TWL XL LVL3 (GOWN DISPOSABLE) ×2
HEMOSTAT SPONGE AVITENE ULTRA (HEMOSTASIS) IMPLANT
KIT BASIN OR (CUSTOM PROCEDURE TRAY) ×1 IMPLANT
KIT TURNOVER KIT B (KITS) ×1 IMPLANT
LOOP VASCULAR MINI 18 RED (MISCELLANEOUS) ×1
NS IRRIG 1000ML POUR BTL (IV SOLUTION) ×1 IMPLANT
PACK CV ACCESS (CUSTOM PROCEDURE TRAY) ×1 IMPLANT
PAD ARMBOARD 7.5X6 YLW CONV (MISCELLANEOUS) ×2 IMPLANT
SLING ARM FOAM STRAP LRG (SOFTGOODS) IMPLANT
SLING ARM FOAM STRAP MED (SOFTGOODS) IMPLANT
SPIKE FLUID TRANSFER (MISCELLANEOUS) ×1 IMPLANT
SUT MNCRL AB 4-0 PS2 18 (SUTURE) ×1 IMPLANT
SUT PROLENE 6 0 BV (SUTURE) ×1 IMPLANT
SUT PROLENE 7 0 BV 1 (SUTURE) IMPLANT
SUT VIC AB 3-0 SH 27 (SUTURE) ×1
SUT VIC AB 3-0 SH 27X BRD (SUTURE) ×1 IMPLANT
TOWEL GREEN STERILE (TOWEL DISPOSABLE) ×1 IMPLANT
UNDERPAD 30X36 HEAVY ABSORB (UNDERPADS AND DIAPERS) ×1 IMPLANT
VASCULAR TIE MINI RED 18IN STL (MISCELLANEOUS) IMPLANT
WATER STERILE IRR 1000ML POUR (IV SOLUTION) ×1 IMPLANT

## 2022-11-28 NOTE — Anesthesia Postprocedure Evaluation (Signed)
Anesthesia Post Note  Patient: Ronald Reeves  Procedure(s) Performed: RIGHT ARM FIRST STAGE BRACHIOBASILIC FISTULA CREATION (Right: Arm Upper)     Patient location during evaluation: PACU Anesthesia Type: General Level of consciousness: awake and alert Pain management: pain level controlled Vital Signs Assessment: post-procedure vital signs reviewed and stable Respiratory status: spontaneous breathing, nonlabored ventilation and respiratory function stable Cardiovascular status: stable and blood pressure returned to baseline Anesthetic complications: no   No notable events documented.  Last Vitals:  Vitals:   11/28/22 1015 11/28/22 1045  BP: (!) 112/53 130/73  Pulse: 72 71  Resp: 12 (!) 25  Temp:    SpO2: 100% 100%    Last Pain:  Vitals:   11/28/22 1045  TempSrc:   PainSc: 4                  Beryle Lathe

## 2022-11-28 NOTE — Op Note (Signed)
OPERATIVE NOTE  DATE: November 28, 2022  PROCEDURE: right first stage basilic vein transposition (brachiobasilic arteriovenous fistula) placement  PRE-OPERATIVE DIAGNOSIS: end stage renal disease  POST-OPERATIVE DIAGNOSIS: same  SURGEON: Cephus Shelling, MD  ASSISTANT(S): Doreatha Massed, PA  ANESTHESIA:  LMA  ESTIMATED BLOOD LOSS: Minimal  FINDING(S): 1.  Basilic vein: 3 mm, acceptable 2.  Brachial artery: high bifurcation, 3 mm, atherosclerotic disease evident 3.  Venous outflow: palpable thrill  4.  Radial flow: palpable radial pulse  SPECIMEN(S):  none  INDICATIONS:   Ronald Reeves is a 67 y.o. male who presents with end stage renal disease and the need for permanent hemodialysis access.  The patient is scheduled for right arm AVF versus graft.  The patient is aware the risks include but are not limited to: bleeding, infection, steal syndrome, nerve damage, ischemic monomelic neuropathy, failure to mature, and need for additional procedures.  The patient is aware of the risks of the procedure and elects to proceed forward.   DESCRIPTION: After full informed written consent was obtained from the patient, the patient was brought back to the operating room and placed supine upon the operating table.  Prior to induction, the patient received IV antibiotics.   After obtaining adequate anesthesia, the patient was then prepped and draped in the standard fashion for a right arm access procedure.  I turned my attention first to identifying the patient's cephalic vein which was small.  The basilic vein looked better as indicated on pre-op vein mapping.  The brachial artery had a high bifurcation and I selected the larger branch.     Using SonoSite guidance, the location of these vessels were marked out on the skin at the antecubital fossa.  I made a transverse incision at the level of the antecubitum and dissected through the subcutaneous tissue and fascia to gain exposure of the  brachial artery.  This was noted to be 3 mm in diameter externally.  This was dissected out proximally and distally and controlled with vessel loops .  I then dissected out the basilic vein.  This was noted to be 3 mm in diameter externally.  The distal segment of the vein was ligated with a  2-0 silk, and the vein was transected.  The proximal segment was interrogated with serial dilators.  The vein accepted up to a 4.5 mm dilator without any difficulty.  I then instilled the heparinized saline into the vein and clamped it.  At this point, I reset my exposure of the brachial artery.  The patient was given 3,000 units IV heparin.  I then placed the artery under tension proximally and distally.  I made an arteriotomy with a #11 blade, and then I extended the arteriotomy with a Potts scissor.  I injected heparinized saline proximal and distal to this arteriotomy.  The vein was then sewn to the artery in an end-to-side configuration with a running stitch of 6-0 Prolene.  Prior to completing this anastomosis, I allowed the vein and artery to backbleed.  There was no evidence of clot from any vessels.  I completed the anastomosis in the usual fashion and then released all vessel loops and clamps.    There was a palpable thrill in the venous outflow, and there was a palpable radial pulse.  At this point, I irrigated out the surgical wound.  There was no further active bleeding.  The subcutaneous tissue was reapproximated with a running stitch of 3-0 Vicryl.  The skin was then reapproximated  with a running subcuticular stitch of 4-0 Monocryll.  The skin was then cleaned, dried, and reinforced with Dermabond.  The patient tolerated this procedure well.   COMPLICATIONS: None  CONDITION: Stable  Cephus Shelling MD [facility] Office: [phone]  Cephus Shelling   11/28/2022, 9:26 AM

## 2022-11-28 NOTE — Anesthesia Procedure Notes (Signed)
Procedure Name: LMA Insertion Date/Time: 11/28/2022 8:25 AM  Performed by: Garfield Cornea, CRNAPre-anesthesia Checklist: Patient identified, Emergency Drugs available, Suction available and Patient being monitored Patient Re-evaluated:Patient Re-evaluated prior to induction Oxygen Delivery Method: Circle System Utilized Preoxygenation: Pre-oxygenation with 100% oxygen Induction Type: IV induction LMA: LMA inserted LMA Size: 4.0 Number of attempts: 1 Placement Confirmation: positive ETCO2 Tube secured with: Tape Dental Injury: Teeth and Oropharynx as per pre-operative assessment

## 2022-11-28 NOTE — H&P (Signed)
  Patient name: Ronald Reeves MRN: [id number] DOB: 09/19/1955 Sex: male  REASON FOR CONSULT: Discuss new permanent hemodialysis access.  HPI: Eliakim Milliron is a 67 y.o. male, with ESRD that presents for evaluation of new permanent hemodialysis access.  Patient previously had a left radiocephalic fistula that required sidebranch ligation ultimately converted to a left brachiobasilic fistula as recently as 01/27/22..  Ultimately this worked well until he had a traumatic cannulation on 05/01/2022 requiring hematoma evacuation and repair of the fistula.    Patient now has a left IJ TDC.  He states that the fistula in his left arm stopped working in mid January.  Now using his catheter.   Past Medical History:  Diagnosis Date   Anemia    Arthritis    Back pain    Bronchitis    Chronic kidney disease    Coronary artery disease    GERD (gastroesophageal reflux disease)    not a current problem   Heart murmur    Hypertension    Myocardial infarction (HCC)    Pre-diabetes    does not have CBG machine.   Substance abuse (HCC)     Past Surgical History:  Procedure Laterality Date   AV FISTULA PLACEMENT Left 04/12/2021   Procedure: LEFT ARM ARTERIOVENOUS (AV) FISTULA CREATION;  Surgeon: Arthor Gorter J, MD;  Location: [facility] OR;  Service: Vascular;  Laterality: Left;   AV FISTULA PLACEMENT Left 10/18/2021   Procedure: LEFT ARM BRACHIOBASILIC FISTULA CREATION FIRST STAGE;  Surgeon: Eliu Batch J, MD;  Location: [facility] OR;  Service: Vascular;  Laterality: Left;   BACK SURGERY     BASCILIC VEIN TRANSPOSITION Left 01/27/2022   Procedure: LEFT SECOND STAGE BASILIC VEIN TRANSPOSITION;  Surgeon: Tris Howell J, MD;  Location: [facility] OR;  Service: Vascular;  Laterality: Left;   BIOPSY  08/20/2022   Procedure: BIOPSY;  Surgeon: Cirigliano, Vito V, DO;  Location: [facility];  Service: Gastroenterology;;   COLONOSCOPY WITH PROPOFOL N/A 08/20/2022   Procedure: COLONOSCOPY WITH PROPOFOL;  Surgeon:  Cirigliano, Vito V, DO;  Location: [facility];  Service: Gastroenterology;  Laterality: N/A;   ESOPHAGOGASTRODUODENOSCOPY (EGD) WITH PROPOFOL N/A 08/20/2022   Procedure: ESOPHAGOGASTRODUODENOSCOPY (EGD) WITH PROPOFOL;  Surgeon: Cirigliano, Vito V, DO;  Location: [facility];  Service: Gastroenterology;  Laterality: N/A;   INSERTION OF DIALYSIS CATHETER Right 05/01/2022   Procedure: INSERTION OF DIALYSIS CATHETER;  Surgeon: Hawken, Thomas N, MD;  Location: [facility];  Service: Vascular;  Laterality: Right;   LIGATION OF COMPETING BRANCHES OF ARTERIOVENOUS FISTULA Left 07/15/2021   Procedure: LIGATION OF COMPETING BRANCHES OF LEFT RADIOCEPHALIC ARTERIOVENOUS FISTULA TIMES TWO;  Surgeon: Bonnie Overdorf J, MD;  Location: [facility];  Service: Vascular;  Laterality: Left;   POLYPECTOMY  08/20/2022   Procedure: POLYPECTOMY;  Surgeon: Cirigliano, Vito V, DO;  Location: [facility];  Service: Gastroenterology;;   REVISON OF ARTERIOVENOUS FISTULA Left 07/15/2021   Procedure: REVISON OF LEFT ARTERIOVENOUS FISTULA;  Surgeon: Allysson Rinehimer J, MD;  Location: [facility];  Service: Vascular;  Laterality: Left;   REVISON OF ARTERIOVENOUS FISTULA Left 05/01/2022   Procedure: ARTERIOVENOUS FISTULA WASHOUT OF ARM HEMATOMA;  Surgeon: Hawken, Thomas N, MD;  Location: [facility];  Service: Vascular;  Laterality: Left;    Family History  Problem Relation Age of Onset   Heart disease Mother    Heart attack Sister 53    SOCIAL HISTORY: Social History   Socioeconomic History   Marital status: Married    Spouse name: Not on file     Number of children: Not on file   Years of education: Not on file   Highest education level: Not on file  Occupational History   Not on file  Tobacco Use   Smoking status: Every Day    Types: Cigars   Smokeless tobacco: Current   Tobacco comments:    3 black and mild a day  Vaping Use   Vaping Use: Never used  Substance and Sexual Activity   Alcohol use: Yes    Alcohol/week: 14.0  standard drinks of alcohol    Types: 14 Cans of beer per week    Comment: Daily   Drug use: Yes    Frequency: 1.0 times per week    Types: Marijuana, Cocaine    Comment: marijuana daily   Sexual activity: Yes  Other Topics Concern   Not on file  Social History Narrative   Not on file   Social Determinants of Health   Financial Resource Strain: Not on file  Food Insecurity: Not on file  Transportation Needs: Not on file  Physical Activity: Not on file  Stress: Not on file  Social Connections: Not on file  Intimate Partner Violence: Not on file    No Known Allergies  Current Outpatient Medications  Medication Sig Dispense Refill   albuterol (VENTOLIN HFA) 108 (90 Base) MCG/ACT inhaler Inhale 2 puffs into the lungs every 6 (six) hours as needed for wheezing or shortness of breath.     amLODipine (NORVASC) 10 MG tablet Take 1 tablet (10 mg total) by mouth daily. 90 tablet 3   aspirin EC 81 MG tablet Take 81 mg by mouth daily.     calcitRIOL (ROCALTROL) 0.5 MCG capsule Take 0.5 mcg by mouth daily.     diclofenac Sodium (VOLTAREN) 1 % GEL Apply 2 g topically 3 (three) times daily as needed (to affected areas for pain).     doxycycline (VIBRAMYCIN) 100 MG capsule Take 1 capsule (100 mg total) by mouth 2 (two) times daily. 20 capsule 0   Ergocalciferol (VITAMIN D2 PO) Take by mouth.     furosemide (LASIX) 80 MG tablet Take 80 mg by mouth daily.     hydrALAZINE (APRESOLINE) 25 MG tablet Take 2 tablets (50 mg total) by mouth every morning. (Patient taking differently: Take 25 mg by mouth 2 (two) times daily.) 180 tablet 3   isosorbide mononitrate (IMDUR) 30 MG 24 hr tablet Take 1 tablet (30 mg total) by mouth daily. 90 tablet 3   labetalol (NORMODYNE) 100 MG tablet Take 2 tablets (200 mg total) by mouth every morning. (Patient taking differently: Take 100 mg by mouth 2 (two) times daily.) 180 tablet 3   multivitamin (RENA-VIT) TABS tablet Take 1 tablet by mouth daily.     Nutritional  Supplements (ENSURE CLEAR) LIQD Take 1 Bottle by mouth daily.     omeprazole (PRILOSEC) 40 MG capsule Take 1 capsule (40 mg total) by mouth daily. 30 capsule 2   rosuvastatin (CRESTOR) 40 MG tablet Take 40 mg by mouth every other day.     tamsulosin (FLOMAX) 0.4 MG CAPS capsule Take 0.8 mg by mouth in the morning.     predniSONE (DELTASONE) 50 MG tablet Take 1 tablet (50 mg total) by mouth daily. 5 tablet 0   No current facility-administered medications for this visit.    REVIEW OF SYSTEMS:  [X] denotes positive finding, [ ] denotes negative finding Cardiac  Comments:  Chest pain or chest pressure:    Shortness of breath   upon exertion:    Short of breath when lying flat:    Irregular heart rhythm:        Vascular    Pain in calf, thigh, or hip brought on by ambulation:    Pain in feet at night that wakes you up from your sleep:     Blood clot in your veins:    Leg swelling:         Pulmonary    Oxygen at home:    Productive cough:     Wheezing:         Neurologic    Sudden weakness in arms or legs:     Sudden numbness in arms or legs:     Sudden onset of difficulty speaking or slurred speech:    Temporary loss of vision in one eye:     Problems with dizziness:         Gastrointestinal    Blood in stool:     Vomited blood:         Genitourinary    Burning when urinating:     Blood in urine:        Psychiatric    Major depression:         Hematologic    Bleeding problems:    Problems with blood clotting too easily:        Skin    Rashes or ulcers:        Constitutional    Fever or chills:      PHYSICAL EXAM: Vitals:   10/14/22 0902  BP: 124/70  Pulse: 79  Resp: 16  Temp: 98.1 F (36.7 C)  TempSrc: Temporal  SpO2: 96%  Weight: 142 lb (64.4 kg)  Height: 5' 11" (1.803 m)    GENERAL: The patient is a well-nourished male, in no acute distress. The vital signs are documented above. CARDIAC: There is a regular rate and rhythm.  VASCULAR:  Palpable  radial and brachial pulses bilateral upper extremities Left arm basilic vein fistula thrombosed with no thrill Left IJ TDC PULMONARY: No respiratory distress. ABDOMEN: Soft and non-tender. MUSCULOSKELETAL: There are no major deformities or cyanosis. NEUROLOGIC: No focal weakness or paresthesias are detected. PSYCHIATRIC: The patient has a normal affect.  DATA:   Upper extremity arterial duplex shows triphasic waveforms of both upper extremities  Vein mapping shows no usable surface vein in the left arm with a small cephalic on the right and a better basilic  Assessment/Plan:  67-year-old male with end-stage renal disease on hemodialysis Tuesday Thursday Saturday that presents to discuss new permanent dialysis access.  He has previously had a left radiocephalic that failed and then had a left arm basilic vein fistula that worked until January of this year when it thrombosed.  We discussed options of an AV graft in the left arm versus going to his right arm where he has at least a usable basilic vein.  I did look at his axilla with ultrasound today and his brachial veins in the upper left arm appear very small.  As a result I think going to his right arm may be a better option particularly with his catheter on the left.  I discussed right arm AV fistula versus graft.  He wants to be scheduled on a Friday.  Risks-benefit is discussed.   Tkai Serfass J. Deandrew Hoecker, MD [facility] [place] Office: [phone]     

## 2022-11-28 NOTE — Transfer of Care (Signed)
Immediate Anesthesia Transfer of Care Note  Patient: Ronald Reeves  Procedure(s) Performed: RIGHT ARM FIRST STAGE BRACHIOBASILIC FISTULA CREATION (Right: Arm Upper)  Patient Location: PACU  Anesthesia Type:General  Level of Consciousness: drowsy  Airway & Oxygen Therapy: Patient Spontanous Breathing and Patient connected to face mask oxygen  Post-op Assessment: Report given to RN and Post -op Vital signs reviewed and stable  Post vital signs: Reviewed and stable  Last Vitals:  Vitals Value Taken Time  BP 129/69 11/28/22 0945  Temp    Pulse 69 11/28/22 0945  Resp 20 11/28/22 0945  SpO2 100 % 11/28/22 0945  Vitals shown include unvalidated device data.  Last Pain:  Vitals:   11/28/22 0737  TempSrc:   PainSc: 0-No pain      Patients Stated Pain Goal: 0 (11/28/22 0737)  Complications: No notable events documented.

## 2022-11-28 NOTE — Discharge Instructions (Signed)
   [facility]  Discharge Instructions  AV Fistula or Graft Surgery for Dialysis Access  Please refer to the following instructions for your post-procedure care. Your surgeon or physician assistant will discuss any changes with you.  Activity  You may drive the day following your surgery, if you are comfortable and no longer taking prescription pain medication. Resume full activity as the soreness in your incision resolves.  Bathing/Showering  You may shower after you go home. Keep your incision dry for 48 hours. Do not soak in a bathtub, hot tub, or swim until the incision heals completely. You may not shower if you have a hemodialysis catheter.  Incision Care  Clean your incision with mild soap and water after 48 hours. Pat the area dry with a clean towel. You do not need a bandage unless otherwise instructed. Do not apply any ointments or creams to your incision. You may have skin glue on your incision. Do not peel it off. It will come off on its own in about one week. Your arm may swell a bit after surgery. To reduce swelling use pillows to elevate your arm so it is above your heart. Your doctor will tell you if you need to lightly wrap your arm with an ACE bandage.  Diet  Resume your normal diet. There are not special food restrictions following this procedure. In order to heal from your surgery, it is CRITICAL to get adequate nutrition. Your body requires vitamins, minerals, and protein. Vegetables are the best source of vitamins and minerals. Vegetables also provide the perfect balance of protein. Processed food has little nutritional value, so try to avoid this.  Medications  Resume taking all of your medications. If your incision is causing pain, you may take over-the counter pain relievers such as acetaminophen (Tylenol). If you were prescribed a stronger pain medication, please be aware these medications can cause nausea and constipation. Prevent  nausea by taking the medication with a snack or meal. Avoid constipation by drinking plenty of fluids and eating foods with high amount of fiber, such as fruits, vegetables, and grains.  Do not take Tylenol if you are taking prescription pain medications.  Follow up Your surgeon may want to see you in the office following your access surgery. If so, this will be arranged at the time of your surgery.  Please call us immediately for any of the following conditions:  Increased pain, redness, drainage (pus) from your incision site Fever of 101 degrees or higher Severe or worsening pain at your incision site Hand pain or numbness.  Reduce your risk of vascular disease:  Stop smoking. If you would like help, call [facility] at 1-800-QUIT-NOW (4137449415) or  at (984)712-0944  Manage your cholesterol Maintain a desired weight Control your diabetes Keep your blood pressure down  Dialysis  It will take several weeks to several months for your new dialysis access to be ready for use. Your surgeon will determine when it is okay to use it. Your nephrologist will continue to direct your dialysis. You can continue to use your Permcath until your new access is ready for use.   11/28/2022 Ronald Reeves [id number] [birth year]  Surgeon(s): Cephus Shelling, MD  Procedure(s): RIGHT ARM FIRST STAGE BRACHIOBASILIC FISTULA CREATION  x Do not stick fistula for 12 weeks    If you have any questions, please call the office at [phone].

## 2022-11-29 ENCOUNTER — Encounter (HOSPITAL_COMMUNITY): Payer: Self-pay | Admitting: Vascular Surgery

## 2022-12-06 ENCOUNTER — Encounter (HOSPITAL_COMMUNITY): Payer: Self-pay

## 2022-12-06 ENCOUNTER — Other Ambulatory Visit: Payer: Self-pay

## 2022-12-06 ENCOUNTER — Emergency Department (HOSPITAL_COMMUNITY): Payer: No Typology Code available for payment source

## 2022-12-06 ENCOUNTER — Observation Stay (HOSPITAL_COMMUNITY)
Admission: EM | Admit: 2022-12-06 | Discharge: 2022-12-07 | Disposition: A | Discharge status: Home / Self Care | Payer: No Typology Code available for payment source | Attending: Emergency Medicine | Admitting: Emergency Medicine

## 2022-12-06 DIAGNOSIS — I2510 Atherosclerotic heart disease of native coronary artery without angina pectoris: Secondary | ICD-10-CM | POA: Insufficient documentation

## 2022-12-06 DIAGNOSIS — J449 Chronic obstructive pulmonary disease, unspecified: Secondary | ICD-10-CM | POA: Diagnosis not present

## 2022-12-06 DIAGNOSIS — N186 End stage renal disease: Secondary | ICD-10-CM

## 2022-12-06 DIAGNOSIS — D649 Anemia, unspecified: Secondary | ICD-10-CM | POA: Diagnosis present

## 2022-12-06 DIAGNOSIS — Z992 Dependence on renal dialysis: Secondary | ICD-10-CM | POA: Diagnosis not present

## 2022-12-06 DIAGNOSIS — F17290 Nicotine dependence, other tobacco product, uncomplicated: Secondary | ICD-10-CM | POA: Insufficient documentation

## 2022-12-06 DIAGNOSIS — I10 Essential (primary) hypertension: Secondary | ICD-10-CM

## 2022-12-06 DIAGNOSIS — Z8616 Personal history of COVID-19: Secondary | ICD-10-CM | POA: Insufficient documentation

## 2022-12-06 DIAGNOSIS — I132 Hypertensive heart and chronic kidney disease with heart failure and with stage 5 chronic kidney disease, or end stage renal disease: Secondary | ICD-10-CM | POA: Insufficient documentation

## 2022-12-06 DIAGNOSIS — D638 Anemia in other chronic diseases classified elsewhere: Secondary | ICD-10-CM | POA: Diagnosis present

## 2022-12-06 DIAGNOSIS — I509 Heart failure, unspecified: Secondary | ICD-10-CM | POA: Insufficient documentation

## 2022-12-06 DIAGNOSIS — R42 Dizziness and giddiness: Secondary | ICD-10-CM | POA: Diagnosis present

## 2022-12-06 DIAGNOSIS — Z79899 Other long term (current) drug therapy: Secondary | ICD-10-CM | POA: Insufficient documentation

## 2022-12-06 DIAGNOSIS — Z7982 Long term (current) use of aspirin: Secondary | ICD-10-CM | POA: Insufficient documentation

## 2022-12-06 DIAGNOSIS — I951 Orthostatic hypotension: Secondary | ICD-10-CM | POA: Diagnosis not present

## 2022-12-06 DIAGNOSIS — K219 Gastro-esophageal reflux disease without esophagitis: Secondary | ICD-10-CM | POA: Diagnosis present

## 2022-12-06 LAB — COMPREHENSIVE METABOLIC PANEL
ALT: 10 U/L (ref 0–44)
AST: 24 U/L (ref 15–41)
Albumin: 3.7 g/dL (ref 3.5–5.0)
Alkaline Phosphatase: 80 U/L (ref 38–126)
Anion gap: 16 — ABNORMAL HIGH (ref 5–15)
BUN: 29 mg/dL — ABNORMAL HIGH (ref 8–23)
CO2: 25 mmol/L (ref 22–32)
Calcium: 8.9 mg/dL (ref 8.9–10.3)
Chloride: 92 mmol/L — ABNORMAL LOW (ref 98–111)
Creatinine, Ser: 5.15 mg/dL — ABNORMAL HIGH (ref 0.61–1.24)
GFR, Estimated: 12 mL/min — ABNORMAL LOW (ref >60)
Glucose, Bld: 161 mg/dL — ABNORMAL HIGH (ref 70–99)
Potassium: 3.9 mmol/L (ref 3.5–5.1)
Sodium: 133 mmol/L — ABNORMAL LOW (ref 135–145)
Total Bilirubin: 0.6 mg/dL (ref 0.3–1.2)
Total Protein: 7.8 g/dL (ref 6.5–8.1)

## 2022-12-06 LAB — CBC WITH DIFFERENTIAL/PLATELET
Abs Immature Granulocytes: 0.01 10*3/uL (ref 0.00–0.07)
Basophils Absolute: 0.1 10*3/uL (ref 0.0–0.1)
Basophils Relative: 1 %
Eosinophils Absolute: 0.5 10*3/uL (ref 0.0–0.5)
Eosinophils Relative: 8 %
HCT: 42.3 % (ref 39.0–52.0)
Hemoglobin: 13.9 g/dL (ref 13.0–17.0)
Immature Granulocytes: 0 %
Lymphocytes Relative: 23 %
Lymphs Abs: 1.6 10*3/uL (ref 0.7–4.0)
MCH: 28.2 pg (ref 26.0–34.0)
MCHC: 32.9 g/dL (ref 30.0–36.0)
MCV: 85.8 fL (ref 80.0–100.0)
Monocytes Absolute: 0.8 10*3/uL (ref 0.1–1.0)
Monocytes Relative: 12 %
Neutro Abs: 3.7 10*3/uL (ref 1.7–7.7)
Neutrophils Relative %: 56 %
Platelets: 128 10*3/uL — ABNORMAL LOW (ref 150–400)
RBC: 4.93 MIL/uL (ref 4.22–5.81)
RDW: 17.2 % — ABNORMAL HIGH (ref 11.5–15.5)
WBC: 6.7 10*3/uL (ref 4.0–10.5)
nRBC: 0 % (ref 0.0–0.2)

## 2022-12-06 LAB — TROPONIN I (HIGH SENSITIVITY)
Troponin I (High Sensitivity): 62 ng/L — ABNORMAL HIGH (ref <18)
Troponin I (High Sensitivity): 56 ng/L — ABNORMAL HIGH (ref <18)

## 2022-12-06 MED ORDER — TAMSULOSIN HCL 0.4 MG PO CAPS
0.8000 mg | ORAL_CAPSULE | Freq: Every day | ORAL | Status: DC
  Administered 2022-12-07: 0.8 mg via ORAL
  Filled 2022-12-06: qty 2

## 2022-12-06 MED ORDER — SODIUM CHLORIDE 0.9 % IV BOLUS
500.0000 mL | Freq: Once | INTRAVENOUS | Status: AC
  Administered 2022-12-06: 500 mL via INTRAVENOUS

## 2022-12-06 MED ORDER — ACETAMINOPHEN 325 MG PO TABS
650.0000 mg | ORAL_TABLET | Freq: Four times a day (QID) | ORAL | Status: DC | PRN
  Administered 2022-12-07: 650 mg via ORAL
  Filled 2022-12-06: qty 2

## 2022-12-06 MED ORDER — HEPARIN SODIUM (PORCINE) 5000 UNIT/ML IJ SOLN
5000.0000 [IU] | Freq: Three times a day (TID) | INTRAMUSCULAR | Status: DC
  Administered 2022-12-07: 5000 [IU] via SUBCUTANEOUS
  Filled 2022-12-06: qty 1

## 2022-12-06 MED ORDER — ONDANSETRON HCL 4 MG/2ML IJ SOLN: 4.0000 mg | Freq: Four times a day (QID) | INTRAMUSCULAR | Status: DC | PRN

## 2022-12-06 MED ORDER — ACETAMINOPHEN 500 MG PO TABS: 1000.0000 mg | ORAL_TABLET | Freq: Once | ORAL | Status: DC

## 2022-12-06 MED ORDER — ONDANSETRON HCL 4 MG PO TABS: 4.0000 mg | ORAL_TABLET | Freq: Four times a day (QID) | ORAL | Status: DC | PRN

## 2022-12-06 MED ORDER — ACETAMINOPHEN 650 MG RE SUPP: 650.0000 mg | Freq: Four times a day (QID) | RECTAL | Status: DC | PRN

## 2022-12-06 MED ORDER — OXYCODONE-ACETAMINOPHEN 5-325 MG PO TABS
1.0000 | ORAL_TABLET | Freq: Once | ORAL | Status: AC
  Administered 2022-12-06: 1 via ORAL
  Filled 2022-12-06: qty 1

## 2022-12-06 MED ORDER — ENSURE CLEAR PO LIQD: 1.0000 | Freq: Every day | ORAL | Status: DC

## 2022-12-06 MED ORDER — ALBUTEROL SULFATE HFA 108 (90 BASE) MCG/ACT IN AERS: 2.0000 | INHALATION_SPRAY | Freq: Four times a day (QID) | RESPIRATORY_TRACT | Status: DC | PRN

## 2022-12-06 MED ORDER — OXYCODONE-ACETAMINOPHEN 5-325 MG PO TABS
1.0000 | ORAL_TABLET | Freq: Four times a day (QID) | ORAL | Status: DC | PRN
  Administered 2022-12-07: 1 via ORAL
  Filled 2022-12-06: qty 1

## 2022-12-06 NOTE — ED Triage Notes (Signed)
Pt arrived POV with wife who reports pt has been dizzy and falls since yesterday morning. Pt is a dialysis pt, had dialysis today, unsure how much fluids they took off. PT reports since dialysis has been very dizziness, especially when standing and has had multiple falls. Hypotension noted BO 82/50 & 89/52. Pt is A&O x4.   Pt reports was 66 kg arrival at dialysis and 63.5 kg after treatment.

## 2022-12-06 NOTE — Assessment & Plan Note (Signed)
Suspect over dialysis / dehydration to blame for ortho stasis and initial low BPs today.  Note that wt is down 10kg (15% of body weight) in 8 days! Got 500cc IVF bolus in ED SBP now 120s in room Going to hold off on further IVF for the moment, and just let pt take POs unrestricted for tonight. Hold home BP meds Sent a secure chat to nephrology, might want to give them a call in AM to see if they want to give more fluid back / etc or not.

## 2022-12-06 NOTE — ED Provider Notes (Signed)
[place] EMERGENCY DEPARTMENT AT [facility] Provider Note   CSN: [id number] Arrival date & time: 12/06/22  2020     History  Chief Complaint  Patient presents with   Dizziness    Ronald Reeves is a 67 y.o. male.  HPI 67 year old male presents with lightheadedness and multiple falls.  He has multiple comorbidities which includes ESRD on dialysis, MI, COPD, CHF, hypertension, substance abuse.  He states he is had lightheadedness when he stands multiple times a day which has caused multiple falls.  He has acute on chronic back pain due to 1 of these falls.  Never hit his head and he does not think he lost consciousness.  He denies any focal weakness or numbness.  He denies any chest pain though he did feel short of breath after one of the near syncope episodes.  However he took his inhaler and felt better and states he has been dealing with shortness of breath for over a month.  He thinks he has long COVID.  Right now at rest he is not feeling lightheaded.  There is no headache. No recent illness such as vomiting or diarrhea.  He went to dialysis 4 times this week, including today, due to a missed dialysis session 1 week ago.  Home Medications Prior to Admission medications   Medication Sig Start Date End Date Taking? Authorizing Provider  albuterol (VENTOLIN HFA) 108 (90 Base) MCG/ACT inhaler Inhale 2 puffs into the lungs every 6 (six) hours as needed for wheezing or shortness of breath.    [provider]  amLODipine (NORVASC) 10 MG tablet Take 1 tablet (10 mg total) by mouth daily. 01/01/18   Lewayne Bunting, MD  aspirin EC 81 MG tablet Take 81 mg by mouth daily.    [provider]  calcitRIOL (ROCALTROL) 0.5 MCG capsule Take 0.5 mcg by mouth daily.    [provider]  Ergocalciferol (VITAMIN D2 PO) Take by mouth.    [provider]  furosemide (LASIX) 80 MG tablet Take 40 mg by mouth daily. 06/17/22   [provider]  hydrALAZINE  (APRESOLINE) 25 MG tablet Take 2 tablets (50 mg total) by mouth every morning. Patient taking differently: Take 25 mg by mouth daily. 01/01/18   Lewayne Bunting, MD  isosorbide mononitrate (IMDUR) 30 MG 24 hr tablet Take 1 tablet (30 mg total) by mouth daily. 01/01/18   Lewayne Bunting, MD  labetalol (NORMODYNE) 100 MG tablet Take 2 tablets (200 mg total) by mouth every morning. Patient taking differently: Take 100 mg by mouth 2 (two) times daily. 01/01/18   Lewayne Bunting, MD  Nutritional Supplements (ENSURE CLEAR) LIQD Take 1 Bottle by mouth daily.    [provider]  omeprazole (PRILOSEC) 40 MG capsule Take 1 capsule (40 mg total) by mouth daily. 06/06/22   Arnaldo Natal, NP  oxyCODONE-acetaminophen (PERCOCET) 5-325 MG tablet Take 1 tablet by mouth every 6 (six) hours as needed for severe pain. 11/28/22   Rhyne, Ames Coupe, PA-C  tamsulosin (FLOMAX) 0.4 MG CAPS capsule Take 0.8 mg by mouth in the morning.    [provider]      Allergies    Patient has no known allergies.    Review of Systems   Review of Systems  Constitutional:  Negative for fever.  Respiratory:  Positive for shortness of breath.   Cardiovascular:  Negative for chest pain.  Gastrointestinal:  Negative for abdominal pain, nausea and vomiting.  Neurological:  Positive for light-headedness. Negative for syncope, weakness, numbness and headaches.    Physical Exam Updated Vital Signs BP 108/61   Pulse 76   Temp 97.9 F (36.6 C)   Resp 15   Ht 5\' 11"  (1.803 m)   Wt 63.5 kg   SpO2 99%   BMI 19.52 kg/m  Physical Exam Vitals and nursing note reviewed.  Constitutional:      General: He is not in acute distress.    Appearance: He is well-developed. He is not ill-appearing or diaphoretic.  HENT:     Head: Normocephalic and atraumatic.  Eyes:     Extraocular Movements: Extraocular movements intact.     Pupils: Pupils are equal, round, and reactive to light.  Cardiovascular:      Rate and Rhythm: Normal rate and regular rhythm.     Heart sounds: Normal heart sounds.  Pulmonary:     Effort: Pulmonary effort is normal.     Breath sounds: Normal breath sounds.  Abdominal:     General: There is no distension.     Palpations: Abdomen is soft.     Tenderness: There is no abdominal tenderness.  Skin:    General: Skin is warm and dry.  Neurological:     Mental Status: He is alert.     Comments: CN 3-12 grossly intact. 5/5 strength in all 4 extremities. Grossly normal sensation. Normal finger to nose.      ED Results / Procedures / Treatments   Labs (all labs ordered are listed, but only abnormal results are displayed) Labs Reviewed  COMPREHENSIVE METABOLIC PANEL - Abnormal; Notable for the following components:      Result Value   Sodium 133 (*)    Chloride 92 (*)    Glucose, Bld 161 (*)    BUN 29 (*)    Creatinine, Ser 5.15 (*)    GFR, Estimated 12 (*)    Anion gap 16 (*)    All other components within normal limits  CBC WITH DIFFERENTIAL/PLATELET - Abnormal; Notable for the following components:   RDW 17.2 (*)    Platelets 128 (*)    All other components within normal limits  TROPONIN I (HIGH SENSITIVITY) - Abnormal; Notable for the following components:   Troponin I (High Sensitivity) 62 (*)    All other components within normal limits  TROPONIN I (HIGH SENSITIVITY)    EKG EKG Interpretation  Date/Time:  Saturday December 06 2022 22:11:12 EDT Ventricular Rate:  69 PR Interval:  203 QRS Duration: 113 QT Interval:  473 QTC Calculation: 507 R Axis:   29 Text Interpretation: Sinus rhythm LAE, consider biatrial enlargement LVH with IVCD and secondary repol abnrm Anterior ST elevation, probably due to LVH Prolonged QT interval similar to Feb 2024 Confirmed by Pricilla Loveless [phone]) on 12/06/2022 10:14:00 PM  Radiology DG Chest 2 View  Result Date: 12/06/2022 CLINICAL DATA:  Fall, near syncope. EXAM: CHEST - 2 VIEW; LUMBAR SPINE - COMPLETE 4+ VIEW  COMPARISON:  12/31/2017, 09/20/2022. FINDINGS: Chest: The heart is enlarged and the mediastinal contour is within normal limits. Atherosclerotic calcification of the aorta is noted. No consolidation, effusion, or pneumothorax. A left internal jugular central venous catheter terminates over the right atrium. Degenerative changes are present in the thoracic spine. No acute osseous abnormality. Lumbar spine: There is no evidence of acute fracture in the lumbar spine. There is mild anterolisthesis at L4-L5. Cervical spinal fusion hardware is noted at L3-L4. Multilevel intervertebral disc space narrowing, endplate osteophyte  formation, and facet arthropathy. IMPRESSION: 1. No acute cardiopulmonary process. 2. Cardiomegaly. 3. Multilevel degenerative changes in the cervical spine without evidence of acute fracture. Electronically Signed   By: Thornell Sartorius M.D.   On: 12/06/2022 21:31   DG Lumbar Spine Complete  Result Date: 12/06/2022 CLINICAL DATA:  Fall, near syncope. EXAM: CHEST - 2 VIEW; LUMBAR SPINE - COMPLETE 4+ VIEW COMPARISON:  12/31/2017, 09/20/2022. FINDINGS: Chest: The heart is enlarged and the mediastinal contour is within normal limits. Atherosclerotic calcification of the aorta is noted. No consolidation, effusion, or pneumothorax. A left internal jugular central venous catheter terminates over the right atrium. Degenerative changes are present in the thoracic spine. No acute osseous abnormality. Lumbar spine: There is no evidence of acute fracture in the lumbar spine. There is mild anterolisthesis at L4-L5. Cervical spinal fusion hardware is noted at L3-L4. Multilevel intervertebral disc space narrowing, endplate osteophyte formation, and facet arthropathy. IMPRESSION: 1. No acute cardiopulmonary process. 2. Cardiomegaly. 3. Multilevel degenerative changes in the cervical spine without evidence of acute fracture. Electronically Signed   By: Thornell Sartorius M.D.   On: 12/06/2022 21:31     Procedures Procedures    Medications Ordered in ED Medications  sodium chloride 0.9 % bolus 500 mL (500 mLs Intravenous New Bag/Given 12/06/22 2144)  oxyCODONE-acetaminophen (PERCOCET/ROXICET) 5-325 MG per tablet 1 tablet (1 tablet Oral Given 12/06/22 2143)    ED Course/ Medical Decision Making/ A&P                             Medical Decision Making Amount and/or Complexity of Data Reviewed Labs: ordered.    Details: CKD but no significant electrolyte disturbance.  Troponin is 62 though probably from CKD rather than ACS.  Normal hemoglobin Radiology: ordered and independent interpretation performed.    Details: No pneumonia.  No lumbar fracture. ECG/medicine tests: ordered and independent interpretation performed.    Details: Chronic changes.  Risk OTC drugs. Prescription drug management. Decision regarding hospitalization.   I suspect patient's lightheadedness and multiple falls are from orthostatic hypotension.  When doing orthostatics here, he started to feel lightheaded and his blood pressure dropped into the 60s after standing for a couple minutes.  He does not have any obvious significant trauma and his x-ray is okay of his back.  He did report some transient dyspnea but this seems to be more of a subacute/chronic problem and went away with an albuterol inhaler so I think PE is pretty unlikely in this scenario.  Will give gentle fluids given his dialysis and CHF history but I do think he probably had too much fluid pulled off over this past week.  He will need admission. Discussed with Dr. Julian Reil.         Final Clinical Impression(s) / ED Diagnoses Final diagnoses:  Orthostatic hypotension    Rx / DC Orders ED Discharge Orders     None         Pricilla Loveless, MD 12/06/22 2246

## 2022-12-06 NOTE — Assessment & Plan Note (Addendum)
Patient tolerated well IV fluids. At the time of his discharge he has no signs of volume overload or uremia.   BUN is 29, K is 3,7 and serum bicarbonate at 29.  Na 136   Plan to resume HD on Tuesday, Thursday and Friday schedule.  HD access left tunneled IJ catheter.   Metabolic bone disease, continue with ergocalciferol.

## 2022-12-06 NOTE — H&P (Signed)
History and Physical    Patient: Ronald Reeves ZOX:[id number] DOB: 1956-07-26 DOA: 12/06/2022 DOS: the patient was seen and examined on 12/06/2022 PCP: Benjiman Core, MD  Patient coming from: Home  Chief Complaint:  Chief Complaint  Patient presents with   Dizziness   HPI: Ronald Reeves is a 67 y.o. male with medical history significant of ESRD on dialysis TTS, cocaine abuse, HTN.  Pt in to ED with lightheadedness and multiple falls. Since yesterday AM.  Missed dialysis week before this past week.  Got 4 episodes of dialysis this past week as a result.  Symptoms worse when standing up, symptoms worse since dialysis today.  BPs in 80s in ED initially.  Wt is down 10kg to 63.5kg in the past 8 days.    Review of Systems: As mentioned in the history of present illness. All other systems reviewed and are negative. Past Medical History:  Diagnosis Date   Anemia    Arthritis    Back pain    Bronchitis    Chronic kidney disease    dialysis on tues, thurs, sat   COPD (chronic obstructive pulmonary disease) (HCC)    Coronary artery disease    COVID    mild - flu like symptoms   Dyspnea    w/ exertion, uses inhaler   GERD (gastroesophageal reflux disease)    not a current problem   Heart murmur    never has caused any problems   Hypertension    Myocardial infarction (HCC)    Pneumonia    x 1   Pre-diabetes    diet controlled, no meds, does not check blood sugar   Substance abuse (HCC)    Past Surgical History:  Procedure Laterality Date   AV FISTULA PLACEMENT Left 04/12/2021   Procedure: LEFT ARM ARTERIOVENOUS (AV) FISTULA CREATION;  Surgeon: Cephus Shelling, MD;  Location: [facility] OR;  Service: Vascular;  Laterality: Left;   AV FISTULA PLACEMENT Left 10/18/2021   Procedure: LEFT ARM BRACHIOBASILIC FISTULA CREATION FIRST STAGE;  Surgeon: Cephus Shelling, MD;  Location: [facility] OR;  Service: Vascular;  Laterality: Left;   AV FISTULA PLACEMENT Right 11/28/2022    Procedure: RIGHT ARM FIRST STAGE BRACHIOBASILIC FISTULA CREATION;  Surgeon: Cephus Shelling, MD;  Location: [facility] OR;  Service: Vascular;  Laterality: Right;   BACK SURGERY     BASCILIC VEIN TRANSPOSITION Left 01/27/2022   Procedure: LEFT SECOND STAGE BASILIC VEIN TRANSPOSITION;  Surgeon: Cephus Shelling, MD;  Location: [facility] OR;  Service: Vascular;  Laterality: Left;   BIOPSY  08/20/2022   Procedure: BIOPSY;  Surgeon: Shellia Cleverly, DO;  Location: [facility];  Service: Gastroenterology;;   COLONOSCOPY WITH PROPOFOL N/A 08/20/2022   Procedure: COLONOSCOPY WITH PROPOFOL;  Surgeon: Shellia Cleverly, DO;  Location: [facility];  Service: Gastroenterology;  Laterality: N/A;   ESOPHAGOGASTRODUODENOSCOPY (EGD) WITH PROPOFOL N/A 08/20/2022   Procedure: ESOPHAGOGASTRODUODENOSCOPY (EGD) WITH PROPOFOL;  Surgeon: Shellia Cleverly, DO;  Location: [facility];  Service: Gastroenterology;  Laterality: N/A;   INSERTION OF DIALYSIS CATHETER Right 05/01/2022   Procedure: INSERTION OF DIALYSIS CATHETER;  Surgeon: [name], MD;  Location: [facility] OR;  Service: Vascular;  Laterality: Right;   LIGATION OF COMPETING BRANCHES OF ARTERIOVENOUS FISTULA Left 07/15/2021   Procedure: LIGATION OF COMPETING BRANCHES OF LEFT RADIOCEPHALIC ARTERIOVENOUS FISTULA TIMES TWO;  Surgeon: Cephus Shelling, MD;  Location: [facility] OR;  Service: Vascular;  Laterality: Left;   POLYPECTOMY  08/20/2022   Procedure: POLYPECTOMY;  Surgeon: Doristine Locks  V, DO;  Location: [facility];  Service: Gastroenterology;;   REVISON OF ARTERIOVENOUS FISTULA Left 07/15/2021   Procedure: REVISON OF LEFT ARTERIOVENOUS FISTULA;  Surgeon: Cephus Shelling, MD;  Location[facility] OR;  Service: Vascular;  Laterality: Left;   REVISON OF ARTERIOVENOUS FISTULA Left 05/01/2022   Procedure: ARTERIOVENOUS FISTULA WASHOUT OF ARM HEMATOMA;  Surgeon: [name], MD;  Location: [facility] OR;  Service: Vascular;  Laterality: Left;   Social History:  reports  that he has been smoking cigars and cigarettes. He has been smoking an average of .5 packs per day. He has been exposed to tobacco smoke. He has never used smokeless tobacco. He reports current alcohol use of about 14.0 standard drinks of alcohol per week. He reports current drug use. Frequency: 1.00 time per week. Drugs: Marijuana and Cocaine.  No Known Allergies  Family History  Problem Relation Age of Onset   Heart disease Mother    Heart attack Sister 86    Prior to Admission medications   Medication Sig Start Date End Date Taking? Authorizing Provider  albuterol (VENTOLIN HFA) 108 (90 Base) MCG/ACT inhaler Inhale 2 puffs into the lungs every 6 (six) hours as needed for wheezing or shortness of breath.    [provider]  amLODipine (NORVASC) 10 MG tablet Take 1 tablet (10 mg total) by mouth daily. 01/01/18   Lewayne Bunting, MD  aspirin EC 81 MG tablet Take 81 mg by mouth daily.    [provider]  calcitRIOL (ROCALTROL) 0.5 MCG capsule Take 0.5 mcg by mouth daily.    [provider]  Ergocalciferol (VITAMIN D2 PO) Take by mouth.    [provider]  furosemide (LASIX) 80 MG tablet Take 40 mg by mouth daily. 06/17/22   [provider]  hydrALAZINE (APRESOLINE) 25 MG tablet Take 2 tablets (50 mg total) by mouth every morning. Patient taking differently: Take 25 mg by mouth daily. 01/01/18   Lewayne Bunting, MD  isosorbide mononitrate (IMDUR) 30 MG 24 hr tablet Take 1 tablet (30 mg total) by mouth daily. 01/01/18   Lewayne Bunting, MD  labetalol (NORMODYNE) 100 MG tablet Take 2 tablets (200 mg total) by mouth every morning. Patient taking differently: Take 100 mg by mouth 2 (two) times daily. 01/01/18   Lewayne Bunting, MD  Nutritional Supplements (ENSURE CLEAR) LIQD Take 1 Bottle by mouth daily.    [provider]  omeprazole (PRILOSEC) 40 MG capsule Take 1 capsule (40 mg total) by mouth daily. 06/06/22   Arnaldo Natal, NP   oxyCODONE-acetaminophen (PERCOCET) 5-325 MG tablet Take 1 tablet by mouth every 6 (six) hours as needed for severe pain. 11/28/22   Rhyne, Ames Coupe, PA-C  tamsulosin (FLOMAX) 0.4 MG CAPS capsule Take 0.8 mg by mouth in the morning.    [provider]    Physical Exam: Vitals:   12/06/22 2035 12/06/22 2036 12/06/22 2040 12/06/22 2045  BP: (!) 89/52 (!) 89/52 105/63 108/61  Pulse: 74 76    Resp: 17 16  15   Temp: 97.9 F (36.6 C) 97.9 F (36.6 C)    TempSrc: Oral     SpO2: 98% 99%    Weight:  63.5 kg    Height:  5\' 11"  (1.803 m)     Constitutional: NAD, calm, comfortable Respiratory: clear to auscultation bilaterally, no wheezing, no crackles. Normal respiratory effort. No accessory muscle use.  Cardiovascular: Regular rate and rhythm, no murmurs / rubs / gallops. No extremity  edema. 2+ pedal pulses. No carotid bruits.  Abdomen: no tenderness, no masses palpated. No hepatosplenomegaly. Bowel sounds positive.  Neurologic: CN 2-12 grossly intact. Sensation intact, DTR normal. Strength 5/5 in all 4.  Psychiatric: Normal judgment and insight. Alert and oriented x 3. Normal mood.   Data Reviewed:       Latest Ref Rng & Units 12/06/2022    8:59 PM 11/28/2022    8:05 AM 09/21/2022    4:14 AM  CBC  WBC 4.0 - 10.5 K/uL 6.7   5.6   Hemoglobin 13.0 - 17.0 g/dL 16.1  09.6  04.5   Hematocrit 39.0 - 52.0 % 42.3  48.0  34.9   Platelets 150 - 400 K/uL 128   158       Latest Ref Rng & Units 12/06/2022    8:59 PM 11/28/2022    8:05 AM 09/21/2022    4:14 AM  CMP  Glucose 70 - 99 mg/dL 409  93  811   BUN 8 - 23 mg/dL 29  35  26   Creatinine 0.61 - 1.24 mg/dL 9.14  7.82  9.56   Sodium 135 - 145 mmol/L 133  139  140   Potassium 3.5 - 5.1 mmol/L 3.9  4.2  4.4   Chloride 98 - 111 mmol/L 92  106  101   CO2 22 - 32 mmol/L 25   25   Calcium 8.9 - 10.3 mg/dL 8.9   9.2   Total Protein 6.5 - 8.1 g/dL 7.8     Total Bilirubin 0.3 - 1.2 mg/dL 0.6     Alkaline Phos 38 - 126 U/L 80     AST 15  - 41 U/L 24     ALT 0 - 44 U/L 10        Assessment and Plan: * Orthostatic hypotension Suspect over dialysis / dehydration to blame for ortho stasis and initial low BPs today.  Note that wt is down 10kg (15% of body weight) in 8 days! Got 500cc IVF bolus in ED SBP now 120s in room Going to hold off on further IVF for the moment, and just let pt take POs unrestricted for tonight. Hold home BP meds Sent a secure chat to nephrology, might want to give them a call in AM to see if they want to give more fluid back / etc or not.  ESRD on dialysis [facility]) Next dialysis not due until Tues. Probably overdialized this past week as discussed above. Med rec pending.  Essential hypertension, malignant Holding home BP meds for the moment in setting of orthostatic hypotension with BPs in the 80s in ED today.      Advance Care Planning:   Code Status: Full Code  Consults: None called, sent secure chat to Dr. Malen Gauze as above  Family Communication: No family in room  Severity of Illness: The appropriate patient status for this patient is OBSERVATION. Observation status is judged to be reasonable and necessary in order to provide the required intensity of service to ensure the patient's safety. The patient's presenting symptoms, physical exam findings, and initial radiographic and laboratory data in the context of their medical condition is felt to place them at decreased risk for further clinical deterioration. Furthermore, it is anticipated that the patient will be medically stable for discharge from the hospital within 2 midnights of admission.   Author: Hillary Bow., DO 12/06/2022 11:15 PM  For on call review [email or web address].

## 2022-12-06 NOTE — Assessment & Plan Note (Signed)
Holding home BP meds for the moment in setting of orthostatic hypotension with BPs in the 80s in ED today.

## 2022-12-07 DIAGNOSIS — R42 Dizziness and giddiness: Secondary | ICD-10-CM | POA: Diagnosis present

## 2022-12-07 DIAGNOSIS — K219 Gastro-esophageal reflux disease without esophagitis: Secondary | ICD-10-CM

## 2022-12-07 DIAGNOSIS — Z79899 Other long term (current) drug therapy: Secondary | ICD-10-CM | POA: Diagnosis not present

## 2022-12-07 DIAGNOSIS — I10 Essential (primary) hypertension: Secondary | ICD-10-CM | POA: Diagnosis not present

## 2022-12-07 DIAGNOSIS — F17290 Nicotine dependence, other tobacco product, uncomplicated: Secondary | ICD-10-CM | POA: Diagnosis not present

## 2022-12-07 DIAGNOSIS — I951 Orthostatic hypotension: Secondary | ICD-10-CM | POA: Diagnosis not present

## 2022-12-07 DIAGNOSIS — I132 Hypertensive heart and chronic kidney disease with heart failure and with stage 5 chronic kidney disease, or end stage renal disease: Secondary | ICD-10-CM | POA: Diagnosis not present

## 2022-12-07 DIAGNOSIS — Z8616 Personal history of COVID-19: Secondary | ICD-10-CM | POA: Diagnosis not present

## 2022-12-07 DIAGNOSIS — J449 Chronic obstructive pulmonary disease, unspecified: Secondary | ICD-10-CM | POA: Diagnosis not present

## 2022-12-07 DIAGNOSIS — Z992 Dependence on renal dialysis: Secondary | ICD-10-CM | POA: Diagnosis not present

## 2022-12-07 DIAGNOSIS — I509 Heart failure, unspecified: Secondary | ICD-10-CM | POA: Diagnosis not present

## 2022-12-07 DIAGNOSIS — N186 End stage renal disease: Secondary | ICD-10-CM | POA: Diagnosis not present

## 2022-12-07 DIAGNOSIS — Z7982 Long term (current) use of aspirin: Secondary | ICD-10-CM | POA: Diagnosis not present

## 2022-12-07 DIAGNOSIS — I2510 Atherosclerotic heart disease of native coronary artery without angina pectoris: Secondary | ICD-10-CM | POA: Diagnosis not present

## 2022-12-07 LAB — BASIC METABOLIC PANEL
Anion gap: 13 (ref 5–15)
BUN: 29 mg/dL — ABNORMAL HIGH (ref 8–23)
CO2: 29 mmol/L (ref 22–32)
Calcium: 9.2 mg/dL (ref 8.9–10.3)
Chloride: 94 mmol/L — ABNORMAL LOW (ref 98–111)
Creatinine, Ser: 5.44 mg/dL — ABNORMAL HIGH (ref 0.61–1.24)
GFR, Estimated: 11 mL/min — ABNORMAL LOW (ref >60)
Glucose, Bld: 140 mg/dL — ABNORMAL HIGH (ref 70–99)
Potassium: 3.7 mmol/L (ref 3.5–5.1)
Sodium: 136 mmol/L (ref 135–145)

## 2022-12-07 LAB — HIV ANTIBODY (ROUTINE TESTING W REFLEX): HIV Screen 4th Generation wRfx: NONREACTIVE

## 2022-12-07 MED ORDER — ENSURE ENLIVE PO LIQD: 237.0000 mL | Freq: Every day | ORAL | Status: DC

## 2022-12-07 MED ORDER — BLOOD PRESSURE KIT: PACK | 0 refills | Status: DC

## 2022-12-07 MED ORDER — ALBUTEROL SULFATE (2.5 MG/3ML) 0.083% IN NEBU: 2.5000 mg | INHALATION_SOLUTION | Freq: Four times a day (QID) | RESPIRATORY_TRACT | Status: DC | PRN

## 2022-12-07 MED ORDER — HYDRALAZINE HCL 25 MG PO TABS: 25.0000 mg | ORAL_TABLET | Freq: Every day | ORAL | Status: DC

## 2022-12-07 MED ORDER — AMLODIPINE BESYLATE 10 MG PO TABS
10.0000 mg | ORAL_TABLET | Freq: Every day | ORAL | Status: DC
  Administered 2022-12-07: 10 mg via ORAL
  Filled 2022-12-07: qty 1

## 2022-12-07 NOTE — Assessment & Plan Note (Signed)
Continue with omeprazole 

## 2022-12-07 NOTE — ED Notes (Signed)
[facility] transport setup for pt  

## 2022-12-07 NOTE — Progress Notes (Signed)
Nsg Discharge Note  Admit Date:  12/06/2022 Discharge date: 12/07/2022   Alyssa Mancera to be D/C'd Home per MD order.  AVS completed.  Patient/caregiver able to verbalize understanding.  Discharge Medication: Allergies as of 12/07/2022   No Known Allergies      Medication List     TAKE these medications    albuterol 108 (90 Base) MCG/ACT inhaler Commonly known as: VENTOLIN HFA Inhale 2 puffs into the lungs every 6 (six) hours as needed for wheezing or shortness of breath.   amLODipine 10 MG tablet Commonly known as: NORVASC Take 1 tablet (10 mg total) by mouth daily.   Blood Pressure Kit Please check blood pressure every day, and keep a log.   Ensure Clear Liqd Take 1 Bottle by mouth daily.   furosemide 40 MG tablet Commonly known as: LASIX Take 80 mg by mouth daily.   hydrALAZINE 25 MG tablet Commonly known as: APRESOLINE Take 1 tablet (25 mg total) by mouth daily.   isosorbide mononitrate 30 MG 24 hr tablet Commonly known as: IMDUR Take 1 tablet (30 mg total) by mouth daily.   labetalol 100 MG tablet Commonly known as: NORMODYNE Take 2 tablets (200 mg total) by mouth every morning. What changed:  how much to take when to take this   omeprazole 40 MG capsule Commonly known as: PRILOSEC Take 1 capsule (40 mg total) by mouth daily. What changed:  when to take this reasons to take this   oxyCODONE-acetaminophen 5-325 MG tablet Commonly known as: Percocet Take 1 tablet by mouth every 6 (six) hours as needed for severe pain.   tamsulosin 0.4 MG Caps capsule Commonly known as: FLOMAX Take 0.8 mg by mouth in the morning.   VITAMIN D2 PO Take by mouth.        Discharge Assessment: Vitals:   12/07/22 0451 12/07/22 0854  BP: (!) 144/70 (!) 142/79  Pulse: 83 84  Resp: 18   Temp: 97.7 F (36.5 C) 98 F (36.7 C)  SpO2: 100% 100%   Skin clean, dry and intact without evidence of skin break down, no evidence of skin tears noted. IV catheter discontinued  intact. Site without signs and symptoms of complications - no redness or edema noted at insertion site, patient denies c/o pain - only slight tenderness at site.  Dressing with slight pressure applied.  D/c Instructions-Education: Discharge instructions given to patient/family with verbalized understanding. D/c education completed with patient/family including follow up instructions, medication list, d/c activities limitations if indicated, with other d/c instructions as indicated by MD - patient able to verbalize understanding, all questions fully answered. Patient instructed to return to ED, call 911, or call MD for any changes in condition.  Patient escorted via WC, and D/C home via private auto.  Kizzie Bane, RN 12/07/2022 11:33 AM

## 2022-12-07 NOTE — Discharge Summary (Signed)
Physician Discharge Summary   Patient: Ewell Benassi MRN: [id number] DOB: [birth year]  Admit date:     12/06/2022  Discharge date: 12/07/22  Discharge Physician: York Ram Dosha Broshears   PCP: Benjiman Core, MD   Recommendations at discharge:    Patient will hold antihypertensive medications in case of systolic blood pressure 120 mmHg or lower. Continue with amlodipine, isosorbide and hydralazine. Continue furosemide.  Follow up with HD on 12/09/22 per his regular schedule.  Follow up with Dr Ellin Mayhew in 7 days.   Discharge Diagnoses: Principal Problem:   Orthostatic hypotension Active Problems:   ESRD on dialysis [facility])   Essential hypertension, malignant   Anemia   GERD without esophagitis  Resolved Problems:   * No resolved hospital problems. [facility] Hospital Course: Mr. Luckey was admitted to the hospital with the working diagnosis of orthostatic hypotension.   67 yo male with the past medical history of ERSD on HD, cocaine use and hypertension who presented with dizziness. Reported lightheadedness and multiple falls for the last 24 hrs prior to admission. He has not been compliant with hemodialysis as outpatient. On the day of admission his symptoms were worse after HD. On his initial physical examination in the ED his systolic blood pressure was 89/52, HR 76, RR 19 and 02 saturation 98%, lungs with no wheezing or rales, heart with S1 and S2 present and rhythmic, abdomen with no distention and no lower extremity edema.   Na 133, K 3,9, Cl 92 bicarbonate 25, glucose 161, bun 29, cr 5,15 High sensitive troponin 62 and 56  Wbc 6,7 hgb 13,9 plt 128   Chest radiograph with no cardiomegaly, no infiltrates, HD catheter in the left IJ.  EKG 68 bpm, left axis deviation, left anterior fascicular block, qtc 510, sinus rhythm with left atrial enlargement, ST depression V5 and V6, T wave inversion, I , AvL, V5 and V6, positive LVH, (no acute changes).   Patient had IV bolus 500cc  with improvement with blood pressure.  HD schedule is Tuesday, Thursday and Saturday.   Assessment and Plan: * Orthostatic hypotension Patient received 500 cc bolus of isotonic saline with good toleration.  At the time of his discharge his systolic blood pressure is 142 mmHg. No further orthostatic symptoms.   Patient will resume his antihypertensive medications. He will follow blood pressure at home and holds antihypertensive medications if systolic blood pressure less than 120 mmHg.  It is suspected that this episode of hypotension is related to over ultrafiltration last week.  Follow up with nephrology as outpatient.  ESRD on dialysis [facility]) Patient tolerated well IV fluids. At the time of his discharge he has no signs of volume overload or uremia.   BUN is 29, K is 3,7 and serum bicarbonate at 29.  Na 136   Plan to resume HD on Tuesday, Thursday and Friday schedule.  HD access left tunneled IJ catheter.   Metabolic bone disease, continue with ergocalciferol.  Essential hypertension, malignant His blood pressure were held on admission, he will resume tomorrow.  Amlodipine, hydralazine, labetalol and furosemide.   Anemia Anemia of chronic renal disease. Hgb has been stable.   GERD without esophagitis Continue with omeprazole.          Consultants: none  Procedures performed: none   Disposition: Home Diet recommendation:  Cardiac diet DISCHARGE MEDICATION: Allergies as of 12/07/2022   No Known Allergies      Medication List     TAKE these medications    albuterol 108 (90  Base) MCG/ACT inhaler Commonly known as: VENTOLIN HFA Inhale 2 puffs into the lungs every 6 (six) hours as needed for wheezing or shortness of breath.   amLODipine 10 MG tablet Commonly known as: NORVASC Take 1 tablet (10 mg total) by mouth daily.   Blood Pressure Kit Please check blood pressure every day, and keep a log.   Ensure Clear Liqd Take 1 Bottle by mouth daily.    furosemide 40 MG tablet Commonly known as: LASIX Take 80 mg by mouth daily.   hydrALAZINE 25 MG tablet Commonly known as: APRESOLINE Take 1 tablet (25 mg total) by mouth daily.   isosorbide mononitrate 30 MG 24 hr tablet Commonly known as: IMDUR Take 1 tablet (30 mg total) by mouth daily.   labetalol 100 MG tablet Commonly known as: NORMODYNE Take 2 tablets (200 mg total) by mouth every morning. What changed:  how much to take when to take this   omeprazole 40 MG capsule Commonly known as: PRILOSEC Take 1 capsule (40 mg total) by mouth daily. What changed:  when to take this reasons to take this   oxyCODONE-acetaminophen 5-325 MG tablet Commonly known as: Percocet Take 1 tablet by mouth every 6 (six) hours as needed for severe pain.   tamsulosin 0.4 MG Caps capsule Commonly known as: FLOMAX Take 0.8 mg by mouth in the morning.   VITAMIN D2 PO Take by mouth.        Discharge Exam: Filed Weights   12/06/22 2036 12/07/22 0128  Weight: 63.5 kg 62.1 kg   BP (!) 142/79 (BP Location: Right Arm)   Pulse 84   Temp 98 F (36.7 C) (Oral)   Resp 18   Ht 5\' 10"  (1.778 m)   Wt 62.1 kg   SpO2 100%   BMI 19.64 kg/m   Patient is feeling better, no chest pain or dyspnea, no dizziness or lightheadedness.   Neurology awake and alert ENT with mild pallor Cardiovascular with S1 and S2 present and rhythmic with no gallops, rubs or murmurs Respiratory with no rales or wheezing, no rhonchi Abdomen with no distention  No lower extremity edema   Condition at discharge: stable  The results of significant diagnostics from this hospitalization (including imaging, microbiology, ancillary and laboratory) are listed below for reference.   Imaging Studies: DG Chest 2 View  Result Date: 12/06/2022 CLINICAL DATA:  Fall, near syncope. EXAM: CHEST - 2 VIEW; LUMBAR SPINE - COMPLETE 4+ VIEW COMPARISON:  12/31/2017, 09/20/2022. FINDINGS: Chest: The heart is enlarged and the  mediastinal contour is within normal limits. Atherosclerotic calcification of the aorta is noted. No consolidation, effusion, or pneumothorax. A left internal jugular central venous catheter terminates over the right atrium. Degenerative changes are present in the thoracic spine. No acute osseous abnormality. Lumbar spine: There is no evidence of acute fracture in the lumbar spine. There is mild anterolisthesis at L4-L5. Cervical spinal fusion hardware is noted at L3-L4. Multilevel intervertebral disc space narrowing, endplate osteophyte formation, and facet arthropathy. IMPRESSION: 1. No acute cardiopulmonary process. 2. Cardiomegaly. 3. Multilevel degenerative changes in the cervical spine without evidence of acute fracture. Electronically Signed   By: Thornell Sartorius M.D.   On: 12/06/2022 21:31   DG Lumbar Spine Complete  Result Date: 12/06/2022 CLINICAL DATA:  Fall, near syncope. EXAM: CHEST - 2 VIEW; LUMBAR SPINE - COMPLETE 4+ VIEW COMPARISON:  12/31/2017, 09/20/2022. FINDINGS: Chest: The heart is enlarged and the mediastinal contour is within normal limits. Atherosclerotic calcification of the aorta is  noted. No consolidation, effusion, or pneumothorax. A left internal jugular central venous catheter terminates over the right atrium. Degenerative changes are present in the thoracic spine. No acute osseous abnormality. Lumbar spine: There is no evidence of acute fracture in the lumbar spine. There is mild anterolisthesis at L4-L5. Cervical spinal fusion hardware is noted at L3-L4. Multilevel intervertebral disc space narrowing, endplate osteophyte formation, and facet arthropathy. IMPRESSION: 1. No acute cardiopulmonary process. 2. Cardiomegaly. 3. Multilevel degenerative changes in the cervical spine without evidence of acute fracture. Electronically Signed   By: Thornell Sartorius M.D.   On: 12/06/2022 21:31    Microbiology: Results for orders placed or performed during the hospital encounter of 09/20/22   Group A Strep by PCR     Status: None   Collection Time: 09/20/22  9:35 PM   Specimen: Throat; Sterile Swab  Result Value Ref Range Status   Group A Strep by PCR NOT DETECTED NOT DETECTED Final    Comment: Performed at [facility], [address] [place], [place] [address]  Resp panel by RT-PCR (RSV, Flu A&B, Covid) Throat     Status: None   Collection Time: 09/20/22  9:35 PM   Specimen: Throat; Nasal Swab  Result Value Ref Range Status   SARS Coronavirus 2 by RT PCR NEGATIVE NEGATIVE Final   Influenza A by PCR NEGATIVE NEGATIVE Final   Influenza B by PCR NEGATIVE NEGATIVE Final    Comment: (NOTE) The Xpert Xpress SARS-CoV-2/FLU/RSV plus assay is intended as an aid in the diagnosis of influenza from Nasopharyngeal swab specimens and should not be used as a sole basis for treatment. Nasal washings and aspirates are unacceptable for Xpert Xpress SARS-CoV-2/FLU/RSV testing.  Fact Sheet for Patients: BloggerCourse.com  Fact Sheet for Healthcare Providers: SeriousBroker.it  This test is not yet approved or cleared by the [place] FDA and has been authorized for detection and/or diagnosis of SARS-CoV-2 by FDA under an Emergency Use Authorization (EUA). This EUA will remain in effect (meaning this test can be used) for the duration of the COVID-19 declaration under Section 564(b)(1) of the Act, 21 U.S.C. section 360bbb-3(b)(1), unless the authorization is terminated or revoked.     Resp Syncytial Virus by PCR NEGATIVE NEGATIVE Final    Comment: (NOTE) Fact Sheet for Patients: BloggerCourse.com  Fact Sheet for Healthcare Providers: SeriousBroker.it  This test is not yet approved or cleared by the [place] FDA and has been authorized for detection and/or diagnosis of SARS-CoV-2 by FDA under an Emergency Use Authorization (EUA). This EUA will remain in effect  (meaning this test can be used) for the duration of the COVID-19 declaration under Section 564(b)(1) of the Act, 21 U.S.C. section 360bbb-3(b)(1), unless the authorization is terminated or revoked.  Performed at [facility], [address], [place], [place] [address]     Labs: CBC: Recent Labs  Lab 12/06/22 2059  WBC 6.7  NEUTROABS 3.7  HGB 13.9  HCT 42.3  MCV 85.8  PLT 128*   Basic Metabolic Panel: Recent Labs  Lab 12/06/22 2059 12/07/22 0145  NA 133* 136  K 3.9 3.7  CL 92* 94*  CO2 25 29  GLUCOSE 161* 140*  BUN 29* 29*  CREATININE 5.15* 5.44*  CALCIUM 8.9 9.2   Liver Function Tests: Recent Labs  Lab 12/06/22 2059  AST 24  ALT 10  ALKPHOS 80  BILITOT 0.6  PROT 7.8  ALBUMIN 3.7   CBG: No results for input(s): "GLUCAP" in the  last 168 hours.  Discharge time spent: greater than 30 minutes.  Signed: Coralie Keens, MD [facility] 12/07/2022

## 2022-12-07 NOTE — Hospital Course (Signed)
Mr. Lowdermilk was admitted to the hospital with the working diagnosis of orthostatic hypotension.   67 yo male with the past medical history of ERSD on HD, cocaine use and hypertension who presented with dizziness. Reported lightheadedness and multiple falls for the last 24 hrs prior to admission. He has not been compliant with hemodialysis as outpatient. On the day of admission his symptoms were worse after HD. On his initial physical examination in the ED his systolic blood pressure was 89/52, HR 76, RR 19 and 02 saturation 98%, lungs with no wheezing or rales, heart with S1 and S2 present and rhythmic, abdomen with no distention and no lower extremity edema.   Na 133, K 3,9, Cl 92 bicarbonate 25, glucose 161, bun 29, cr 5,15 High sensitive troponin 62 and 56  Wbc 6,7 hgb 13,9 plt 128   Chest radiograph with no cardiomegaly, no infiltrates, HD catheter in the left IJ.  EKG 68 bpm, left axis deviation, left anterior fascicular block, qtc 510, sinus rhythm with left atrial enlargement, ST depression V5 and V6, T wave inversion, I , AvL, V5 and V6, positive LVH, (no acute changes).   Patient had IV bolus 500cc with improvement with blood pressure.  HD schedule is Tuesday, Thursday and Saturday.

## 2022-12-07 NOTE — Discharge Instructions (Signed)
Recommendations at discharge:     Patient will hold antihypertensive medications in case of systolic blood pressure 120 mmHg or lower. Continue with amlodipine, isosorbide and hydralazine. Continue furosemide.  Follow up with HD on 12/09/22 per his regular schedule.  Follow up with Dr Ellin Mayhew in 7 days

## 2022-12-07 NOTE — Assessment & Plan Note (Signed)
Anemia of chronic renal disease. Hgb has been stable.

## 2022-12-10 ENCOUNTER — Telehealth: Payer: Self-pay | Admitting: Surgery

## 2022-12-11 NOTE — Telephone Encounter (Signed)
Appt has been r/s

## 2022-12-11 NOTE — Telephone Encounter (Signed)
Appt time has been changed.

## 2022-12-15 ENCOUNTER — Telehealth: Payer: Self-pay

## 2022-12-15 NOTE — Telephone Encounter (Signed)
Pt called requesting an appt for today. He stated that his R arm was a little swollen, but had significantly decreased in size over the weekend. His HD center told him to call this office.  Called [facility], spoke with RN who was present on Saturday, no record of anything abnormal with pt's arm.   Reviewed pt's chart, returned call for clarification, two identifiers used. Pt denies any other symptoms and stated that his arm was almost twice as big as normal, but it had decreased in size. He stated it was still swollen. Instructed him to elevate as much as possible and monitor. HD center will check his arm tomorrow during treatment. If swelling persists, he can use light ACE wrap. If any worsening symptoms, he was instructed to call the office. Confirmed understanding.

## 2022-12-16 ENCOUNTER — Inpatient Hospital Stay (HOSPITAL_COMMUNITY)
Admission: EM | Admit: 2022-12-16 | Discharge: 2022-12-18 | DRG: 291 | Discharge status: Against Medical Advice | Payer: No Typology Code available for payment source | Attending: Student | Admitting: Student

## 2022-12-16 ENCOUNTER — Emergency Department (HOSPITAL_COMMUNITY): Payer: No Typology Code available for payment source

## 2022-12-16 ENCOUNTER — Other Ambulatory Visit: Payer: Self-pay

## 2022-12-16 ENCOUNTER — Encounter (HOSPITAL_COMMUNITY): Payer: Self-pay

## 2022-12-16 DIAGNOSIS — I5023 Acute on chronic systolic (congestive) heart failure: Secondary | ICD-10-CM | POA: Diagnosis present

## 2022-12-16 DIAGNOSIS — B952 Enterococcus as the cause of diseases classified elsewhere: Secondary | ICD-10-CM | POA: Diagnosis present

## 2022-12-16 DIAGNOSIS — I2510 Atherosclerotic heart disease of native coronary artery without angina pectoris: Secondary | ICD-10-CM | POA: Diagnosis present

## 2022-12-16 DIAGNOSIS — G8929 Other chronic pain: Secondary | ICD-10-CM | POA: Diagnosis present

## 2022-12-16 DIAGNOSIS — J810 Acute pulmonary edema: Principal | ICD-10-CM

## 2022-12-16 DIAGNOSIS — I132 Hypertensive heart and chronic kidney disease with heart failure and with stage 5 chronic kidney disease, or end stage renal disease: Secondary | ICD-10-CM | POA: Diagnosis not present

## 2022-12-16 DIAGNOSIS — N400 Enlarged prostate without lower urinary tract symptoms: Secondary | ICD-10-CM | POA: Diagnosis present

## 2022-12-16 DIAGNOSIS — Z8249 Family history of ischemic heart disease and other diseases of the circulatory system: Secondary | ICD-10-CM

## 2022-12-16 DIAGNOSIS — R296 Repeated falls: Secondary | ICD-10-CM | POA: Diagnosis present

## 2022-12-16 DIAGNOSIS — D649 Anemia, unspecified: Secondary | ICD-10-CM | POA: Diagnosis present

## 2022-12-16 DIAGNOSIS — Z5329 Procedure and treatment not carried out because of patient's decision for other reasons: Secondary | ICD-10-CM | POA: Diagnosis present

## 2022-12-16 DIAGNOSIS — E785 Hyperlipidemia, unspecified: Secondary | ICD-10-CM | POA: Diagnosis present

## 2022-12-16 DIAGNOSIS — K219 Gastro-esophageal reflux disease without esophagitis: Secondary | ICD-10-CM | POA: Diagnosis present

## 2022-12-16 DIAGNOSIS — Z79899 Other long term (current) drug therapy: Secondary | ICD-10-CM

## 2022-12-16 DIAGNOSIS — M5136 Other intervertebral disc degeneration, lumbar region: Secondary | ICD-10-CM | POA: Diagnosis present

## 2022-12-16 DIAGNOSIS — I700 Atherosclerosis of aorta: Secondary | ICD-10-CM | POA: Diagnosis present

## 2022-12-16 DIAGNOSIS — F1410 Cocaine abuse, uncomplicated: Secondary | ICD-10-CM | POA: Diagnosis present

## 2022-12-16 DIAGNOSIS — Z7982 Long term (current) use of aspirin: Secondary | ICD-10-CM

## 2022-12-16 DIAGNOSIS — Z992 Dependence on renal dialysis: Secondary | ICD-10-CM

## 2022-12-16 DIAGNOSIS — M898X9 Other specified disorders of bone, unspecified site: Secondary | ICD-10-CM | POA: Diagnosis present

## 2022-12-16 DIAGNOSIS — R5381 Other malaise: Secondary | ICD-10-CM | POA: Diagnosis present

## 2022-12-16 DIAGNOSIS — F17290 Nicotine dependence, other tobacco product, uncomplicated: Secondary | ICD-10-CM | POA: Diagnosis present

## 2022-12-16 DIAGNOSIS — J439 Emphysema, unspecified: Secondary | ICD-10-CM | POA: Diagnosis present

## 2022-12-16 DIAGNOSIS — F17210 Nicotine dependence, cigarettes, uncomplicated: Secondary | ICD-10-CM | POA: Diagnosis present

## 2022-12-16 DIAGNOSIS — Y95 Nosocomial condition: Secondary | ICD-10-CM | POA: Diagnosis present

## 2022-12-16 DIAGNOSIS — I5022 Chronic systolic (congestive) heart failure: Secondary | ICD-10-CM | POA: Diagnosis present

## 2022-12-16 DIAGNOSIS — N2581 Secondary hyperparathyroidism of renal origin: Secondary | ICD-10-CM | POA: Diagnosis present

## 2022-12-16 DIAGNOSIS — I252 Old myocardial infarction: Secondary | ICD-10-CM

## 2022-12-16 DIAGNOSIS — M545 Low back pain, unspecified: Secondary | ICD-10-CM

## 2022-12-16 DIAGNOSIS — R7303 Prediabetes: Secondary | ICD-10-CM | POA: Diagnosis present

## 2022-12-16 DIAGNOSIS — J440 Chronic obstructive pulmonary disease with acute lower respiratory infection: Secondary | ICD-10-CM | POA: Diagnosis present

## 2022-12-16 DIAGNOSIS — R7881 Bacteremia: Secondary | ICD-10-CM | POA: Diagnosis present

## 2022-12-16 DIAGNOSIS — N186 End stage renal disease: Secondary | ICD-10-CM | POA: Diagnosis present

## 2022-12-16 DIAGNOSIS — J189 Pneumonia, unspecified organism: Secondary | ICD-10-CM | POA: Diagnosis present

## 2022-12-16 LAB — CBC WITH DIFFERENTIAL/PLATELET
Abs Immature Granulocytes: 0.02 10*3/uL (ref 0.00–0.07)
Basophils Absolute: 0.1 10*3/uL (ref 0.0–0.1)
Basophils Relative: 1 %
Eosinophils Absolute: 0.2 10*3/uL (ref 0.0–0.5)
Eosinophils Relative: 3 %
HCT: 42.2 % (ref 39.0–52.0)
Hemoglobin: 13.4 g/dL (ref 13.0–17.0)
Immature Granulocytes: 0 %
Lymphocytes Relative: 12 %
Lymphs Abs: 1.1 10*3/uL (ref 0.7–4.0)
MCH: 27.5 pg (ref 26.0–34.0)
MCHC: 31.8 g/dL (ref 30.0–36.0)
MCV: 86.5 fL (ref 80.0–100.0)
Monocytes Absolute: 1.1 10*3/uL — ABNORMAL HIGH (ref 0.1–1.0)
Monocytes Relative: 13 %
Neutro Abs: 6.1 10*3/uL (ref 1.7–7.7)
Neutrophils Relative %: 71 %
Platelets: 129 10*3/uL — ABNORMAL LOW (ref 150–400)
RBC: 4.88 MIL/uL (ref 4.22–5.81)
RDW: 17.1 % — ABNORMAL HIGH (ref 11.5–15.5)
WBC: 8.6 10*3/uL (ref 4.0–10.5)
nRBC: 0 % (ref 0.0–0.2)

## 2022-12-16 LAB — PROTIME-INR
INR: 1.1 (ref 0.8–1.2)
Prothrombin Time: 14.3 seconds (ref 11.4–15.2)

## 2022-12-16 MED ORDER — ONDANSETRON HCL 4 MG/2ML IJ SOLN
4.0000 mg | Freq: Once | INTRAMUSCULAR | Status: AC
  Administered 2022-12-16: 4 mg via INTRAVENOUS
  Filled 2022-12-16: qty 2

## 2022-12-16 MED ORDER — FENTANYL CITRATE PF 50 MCG/ML IJ SOSY
50.0000 ug | PREFILLED_SYRINGE | Freq: Once | INTRAMUSCULAR | Status: AC
  Administered 2022-12-16: 50 ug via INTRAVENOUS
  Filled 2022-12-16: qty 1

## 2022-12-16 MED ORDER — IOHEXOL 300 MG/ML  SOLN: 100.0000 mL | Freq: Once | INTRAMUSCULAR | Status: DC | PRN

## 2022-12-16 NOTE — ED Triage Notes (Signed)
Patient arrives with complaints of back pain that started today after dialysis. Patient states the back pain started during dialysis about 3pm. Patient receives dialysis on T, Th, Sat. States pain is getting worse, 10/10. History of back surgery in 2018. Patient was unable to get out of the car and as assisted by staff to into the hospital.

## 2022-12-16 NOTE — ED Provider Notes (Addendum)
[place] [facility] AT [facility] Provider Note   CSN: [id number] Arrival date & time: 12/16/22  9528     History  Chief Complaint  Patient presents with   Back Pain    Ronald Reeves is a 67 y.o. male.  HPI    67 year old male with history of cocaine use disorder, ESRD on hemodialysis comes in with chief complaint of back pain, cough.  Patient accompanied by his wife, who provides meaningful history as well.  According to the patient's wife, patient woke up this morning and was complaining of left-sided hip pain and back pain.  Patient does not have back pain generally.  He was able to walk, he went to dialysis and while he was getting his dialysis the pain became excruciating.  Patient came home, and the pain continued to worsen therefore he decided to come to the ER.  Patient now unable to get up and walk on his own accord because of pain.  Patient states that the pain is primarily in his back and his hip area.  Pain is nonradiating.  There is no associated numbness, tingling, lower extremity weakness.  Patient also indicates that he has been having some cough and he is producing more phlegm that is yellow in color.  Review of systems negative for any fevers, night sweats, chills. Home Medications Prior to Admission medications   Medication Sig Start Date End Date Taking? Authorizing Provider  albuterol (VENTOLIN HFA) 108 (90 Base) MCG/ACT inhaler Inhale 2 puffs into the lungs every 6 (six) hours as needed for wheezing or shortness of breath.   Yes [provider]  amLODipine (NORVASC) 10 MG tablet Take 1 tablet (10 mg total) by mouth daily. 01/01/18  Yes Lewayne Bunting, MD  aspirin EC 81 MG tablet Take 81 mg by mouth daily.   Yes [provider]  furosemide (LASIX) 40 MG tablet Take 80 mg by mouth daily.   Yes [provider]  hydrALAZINE (APRESOLINE) 25 MG tablet Take 1 tablet (25 mg total) by mouth daily. Patient taking  differently: Take 25 mg by mouth in the morning and at bedtime. 12/07/22  Yes Arrien, York Ram, MD  isosorbide mononitrate (IMDUR) 30 MG 24 hr tablet Take 1 tablet (30 mg total) by mouth daily. 01/01/18  Yes Lewayne Bunting, MD  labetalol (NORMODYNE) 100 MG tablet Take 2 tablets (200 mg total) by mouth every morning. Patient taking differently: Take 100 mg by mouth See admin instructions. 100 mg twice daily, except 1 tablet daily on dialysis days (taken after dialysis). 01/01/18  Yes Lewayne Bunting, MD  omeprazole (PRILOSEC) 40 MG capsule Take 1 capsule (40 mg total) by mouth daily. Patient taking differently: Take 40 mg by mouth daily as needed (heartburn). 06/06/22  Yes Arnaldo Natal, NP  oxyCODONE-acetaminophen (PERCOCET) 5-325 MG tablet Take 1 tablet by mouth every 6 (six) hours as needed for severe pain. Patient not taking: Reported on 12/18/2022 11/28/22  Yes Rhyne, Ames Coupe, PA-C  tamsulosin (FLOMAX) 0.4 MG CAPS capsule Take 0.4 mg by mouth 2 (two) times daily.   Yes [provider]  Blood Pressure KIT Please check blood pressure every day, and keep a log. Patient not taking: Reported on 12/17/2022 12/07/22   Arrien, York Ram, MD  diclofenac Sodium (VOLTAREN) 1 % GEL Apply 1 Application topically 2 (two) times daily as needed (pain).    [provider]  multivitamin (RENA-VIT) TABS tablet Take 1 tablet by mouth daily.  [provider]      Allergies    Patient has no known allergies.    Review of Systems   Review of Systems  All other systems reviewed and are negative.   Physical Exam Updated Vital Signs BP (!) 115/56 (BP Location: Left Arm)   Pulse 71   Temp 97.8 F (36.6 C) (Oral)   Resp 11   Ht 5\' 10"  (1.778 m)   Wt 61.3 kg   SpO2 97%   BMI 19.39 kg/m  Physical Exam Vitals and nursing note reviewed.  Constitutional:      Appearance: He is well-developed.  HENT:     Head: Atraumatic.  Cardiovascular:     Rate and  Rhythm: Normal rate.     Heart sounds: Murmur heard.  Pulmonary:     Effort: Pulmonary effort is normal.  Abdominal:     Tenderness: There is no abdominal tenderness.  Musculoskeletal:     Cervical back: Neck supple.     Comments: Patient has reproducible tenderness with palpation of the lumbar spine and left hip.  Left posterior hip tenderness appears to be worse.  Patient unable to dissipating active leg raise of the left lower extremity.  He is however able to tolerate passive leg raise without any difficulty.  Tenderness is worse again with external and internal rotation of the left hip.    Skin:    General: Skin is warm.  Neurological:     Mental Status: He is alert and oriented to person, place, and time.     Comments: 1+ patellar reflex bilaterally     ED Results / Procedures / Treatments   Labs (all labs ordered are listed, but only abnormal results are displayed) Labs Reviewed  CULTURE, BLOOD (ROUTINE X 2) - Abnormal; Notable for the following components:      Result Value   Culture ENTEROCOCCUS FAECALIS (*)    All other components within normal limits  BLOOD CULTURE ID PANEL (REFLEXED) - BCID2 - Abnormal; Notable for the following components:   Enterococcus faecalis DETECTED (*)    All other components within normal limits  COMPREHENSIVE METABOLIC PANEL - Abnormal; Notable for the following components:   CO2 21 (*)    Glucose, Bld 111 (*)    BUN 27 (*)    Creatinine, Ser 5.38 (*)    Calcium 7.9 (*)    Albumin 2.9 (*)    GFR, Estimated 11 (*)    All other components within normal limits  CBC WITH DIFFERENTIAL/PLATELET - Abnormal; Notable for the following components:   RDW 17.1 (*)    Platelets 129 (*)    Monocytes Absolute 1.1 (*)    All other components within normal limits  C-REACTIVE PROTEIN - Abnormal; Notable for the following components:   CRP 6.4 (*)    All other components within normal limits  CBC WITH DIFFERENTIAL/PLATELET - Abnormal; Notable for the  following components:   RDW 17.2 (*)    Platelets 126 (*)    Monocytes Absolute 1.3 (*)    All other components within normal limits  COMPREHENSIVE METABOLIC PANEL - Abnormal; Notable for the following components:   Chloride 97 (*)    Glucose, Bld 137 (*)    BUN 31 (*)    Creatinine, Ser 6.42 (*)    Albumin 3.1 (*)    GFR, Estimated 9 (*)    All other components within normal limits  BRAIN NATRIURETIC PEPTIDE - Abnormal; Notable for the following components:   B  Natriuretic Peptide >4,500.0 (*)    All other components within normal limits  RAPID URINE DRUG SCREEN, HOSP PERFORMED - Abnormal; Notable for the following components:   Cocaine POSITIVE (*)    All other components within normal limits  URINALYSIS, W/ REFLEX TO CULTURE (INFECTION SUSPECTED) - Abnormal; Notable for the following components:   APPearance HAZY (*)    Hgb urine dipstick SMALL (*)    Protein, ur 100 (*)    Leukocytes,Ua TRACE (*)    Bacteria, UA FEW (*)    All other components within normal limits  HEPATITIS A ANTIBODY, TOTAL - Abnormal; Notable for the following components:   hep A Total Ab Reactive (*)    All other components within normal limits  CBC - Abnormal; Notable for the following components:   RDW 16.9 (*)    Platelets 119 (*)    All other components within normal limits  RENAL FUNCTION PANEL - Abnormal; Notable for the following components:   Sodium 133 (*)    Chloride 96 (*)    Glucose, Bld 107 (*)    BUN 38 (*)    Creatinine, Ser 6.31 (*)    Calcium 8.7 (*)    Phosphorus 4.8 (*)    Albumin 2.7 (*)    GFR, Estimated 9 (*)    All other components within normal limits  MRSA NEXT GEN BY PCR, NASAL  CULTURE, BLOOD (ROUTINE X 2)  CULTURE, BLOOD (ROUTINE X 2)  EXPECTORATED SPUTUM ASSESSMENT W GRAM STAIN, RFLX TO RESP C  PROTIME-INR  SEDIMENTATION RATE  MAGNESIUM  PHOSPHORUS  HEPATITIS B SURFACE ANTIGEN  HEPATITIS B SURFACE ANTIBODY, QUANTITATIVE  PROCALCITONIN  HEPATITIS C ANTIBODY     EKG None   Radiology No results found.  Procedures .Critical Care  Performed by: Derwood Kaplan, MD Authorized by: Derwood Kaplan, MD   Critical care provider statement:    Critical care time (minutes):  32   Critical care was necessary to treat or prevent imminent or life-threatening deterioration of the following conditions: bacteremia.   Critical care was time spent personally by me on the following activities:  Development of treatment plan with patient or surrogate, discussions with consultants, evaluation of patient's response to treatment, examination of patient, ordering and review of laboratory studies, ordering and review of radiographic studies, ordering and performing treatments and interventions, pulse oximetry, re-evaluation of patient's condition and review of old charts     Medications Ordered in ED Medications  fentaNYL (SUBLIMAZE) injection 50 mcg (50 mcg Intravenous Given 12/16/22 2304)  ondansetron (ZOFRAN) injection 4 mg (4 mg Intravenous Given 12/16/22 2306)  iohexol (OMNIPAQUE) 350 MG/ML injection 100 mL (100 mLs Intravenous Contrast Given 12/17/22 0023)  fentaNYL (SUBLIMAZE) injection 50 mcg (50 mcg Intravenous Given 12/17/22 0227)  furosemide (LASIX) injection 40 mg (40 mg Intravenous Given 12/17/22 0230)  ceFEPIme (MAXIPIME) 2 g in sodium chloride 0.9 % 100 mL IVPB (0 g Intravenous Stopped 12/17/22 0401)  vancomycin (VANCOREADY) IVPB 1500 mg/300 mL (0 mg Intravenous Stopped 12/17/22 1038)    ED Course/ Medical Decision Making/ A&P                             Medical Decision Making Amount and/or Complexity of Data Reviewed Labs: ordered. Radiology: ordered.  Risk Prescription drug management. Decision regarding hospitalization.   67 year old male with history of ESRD on hemodialysis, remote history of cocaine use comes in with chief complaint of left hip and back  pain.  He is also complaining of hypertension that is productive.  On exam patient  appears to be slightly uncomfortable because of his pain.  Differential diagnosis for him includes pneumonia, dissection, kidney stone, septic arthritis, vascular necrosis of the hip, occult hip fracture, discitis, epidural abscess.  At this time patient has no red flags for cord compression, cauda equina. After coming to the ER, he did have nausea and requested nausea medications.  Plan is to get basic labs and CT dissection study.  CT lumbar spine has been added to the CT scans.  For pain I will give patient fentanyl.  If patient's workup is reassuring, and the pain continues to be severe and he has elevated CBC or inflammatory marker, that it would be best to admit the patient for pain control, rehab and evaluation for MRI.  Patient's care has been signed out to incoming team.  Final Clinical Impression(s) / ED Diagnoses Final diagnoses:  Acute pulmonary edema (HCC)  Acute left-sided low back pain without sciatica  Bacteremia    Rx / DC Orders ED Discharge Orders     None         Derwood Kaplan, MD 12/16/22 8119    Derwood Kaplan, MD 12/19/22 [phone]

## 2022-12-17 ENCOUNTER — Inpatient Hospital Stay (HOSPITAL_COMMUNITY): Payer: No Typology Code available for payment source

## 2022-12-17 ENCOUNTER — Telehealth (HOSPITAL_COMMUNITY): Payer: Self-pay | Admitting: Emergency Medicine

## 2022-12-17 ENCOUNTER — Encounter (HOSPITAL_COMMUNITY): Payer: Self-pay | Admitting: Internal Medicine

## 2022-12-17 ENCOUNTER — Emergency Department (HOSPITAL_COMMUNITY): Payer: No Typology Code available for payment source

## 2022-12-17 DIAGNOSIS — Y95 Nosocomial condition: Secondary | ICD-10-CM | POA: Diagnosis present

## 2022-12-17 DIAGNOSIS — G8929 Other chronic pain: Secondary | ICD-10-CM | POA: Diagnosis present

## 2022-12-17 DIAGNOSIS — Z8249 Family history of ischemic heart disease and other diseases of the circulatory system: Secondary | ICD-10-CM | POA: Diagnosis not present

## 2022-12-17 DIAGNOSIS — I2510 Atherosclerotic heart disease of native coronary artery without angina pectoris: Secondary | ICD-10-CM | POA: Diagnosis present

## 2022-12-17 DIAGNOSIS — N186 End stage renal disease: Secondary | ICD-10-CM

## 2022-12-17 DIAGNOSIS — E785 Hyperlipidemia, unspecified: Secondary | ICD-10-CM | POA: Diagnosis present

## 2022-12-17 DIAGNOSIS — D649 Anemia, unspecified: Secondary | ICD-10-CM | POA: Diagnosis present

## 2022-12-17 DIAGNOSIS — I5023 Acute on chronic systolic (congestive) heart failure: Secondary | ICD-10-CM | POA: Diagnosis present

## 2022-12-17 DIAGNOSIS — Z7982 Long term (current) use of aspirin: Secondary | ICD-10-CM | POA: Diagnosis not present

## 2022-12-17 DIAGNOSIS — J189 Pneumonia, unspecified organism: Secondary | ICD-10-CM | POA: Diagnosis present

## 2022-12-17 DIAGNOSIS — J439 Emphysema, unspecified: Secondary | ICD-10-CM | POA: Diagnosis present

## 2022-12-17 DIAGNOSIS — I252 Old myocardial infarction: Secondary | ICD-10-CM | POA: Diagnosis not present

## 2022-12-17 DIAGNOSIS — N2581 Secondary hyperparathyroidism of renal origin: Secondary | ICD-10-CM | POA: Diagnosis present

## 2022-12-17 DIAGNOSIS — Z992 Dependence on renal dialysis: Secondary | ICD-10-CM | POA: Diagnosis not present

## 2022-12-17 DIAGNOSIS — R7881 Bacteremia: Secondary | ICD-10-CM | POA: Diagnosis present

## 2022-12-17 DIAGNOSIS — B952 Enterococcus as the cause of diseases classified elsewhere: Secondary | ICD-10-CM | POA: Diagnosis present

## 2022-12-17 DIAGNOSIS — N400 Enlarged prostate without lower urinary tract symptoms: Secondary | ICD-10-CM | POA: Diagnosis present

## 2022-12-17 DIAGNOSIS — F17210 Nicotine dependence, cigarettes, uncomplicated: Secondary | ICD-10-CM | POA: Diagnosis present

## 2022-12-17 DIAGNOSIS — R296 Repeated falls: Secondary | ICD-10-CM | POA: Diagnosis present

## 2022-12-17 DIAGNOSIS — J440 Chronic obstructive pulmonary disease with acute lower respiratory infection: Secondary | ICD-10-CM | POA: Diagnosis present

## 2022-12-17 DIAGNOSIS — K219 Gastro-esophageal reflux disease without esophagitis: Secondary | ICD-10-CM | POA: Diagnosis present

## 2022-12-17 DIAGNOSIS — I700 Atherosclerosis of aorta: Secondary | ICD-10-CM | POA: Diagnosis present

## 2022-12-17 DIAGNOSIS — Z79899 Other long term (current) drug therapy: Secondary | ICD-10-CM | POA: Diagnosis not present

## 2022-12-17 DIAGNOSIS — F1410 Cocaine abuse, uncomplicated: Secondary | ICD-10-CM | POA: Diagnosis present

## 2022-12-17 DIAGNOSIS — F17290 Nicotine dependence, other tobacco product, uncomplicated: Secondary | ICD-10-CM | POA: Diagnosis present

## 2022-12-17 DIAGNOSIS — I132 Hypertensive heart and chronic kidney disease with heart failure and with stage 5 chronic kidney disease, or end stage renal disease: Secondary | ICD-10-CM | POA: Diagnosis present

## 2022-12-17 DIAGNOSIS — I5022 Chronic systolic (congestive) heart failure: Secondary | ICD-10-CM | POA: Diagnosis present

## 2022-12-17 LAB — CULTURE, BLOOD (ROUTINE X 2): Special Requests: ADEQUATE

## 2022-12-17 LAB — MAGNESIUM: Magnesium: 2.2 mg/dL (ref 1.7–2.4)

## 2022-12-17 LAB — COMPREHENSIVE METABOLIC PANEL
ALT: 14 U/L (ref 0–44)
ALT: 16 U/L (ref 0–44)
AST: 20 U/L (ref 15–41)
AST: 20 U/L (ref 15–41)
Albumin: 2.9 g/dL — ABNORMAL LOW (ref 3.5–5.0)
Albumin: 3.1 g/dL — ABNORMAL LOW (ref 3.5–5.0)
Alkaline Phosphatase: 52 U/L (ref 38–126)
Alkaline Phosphatase: 60 U/L (ref 38–126)
Anion gap: 10 (ref 5–15)
Anion gap: 15 (ref 5–15)
BUN: 27 mg/dL — ABNORMAL HIGH (ref 8–23)
BUN: 31 mg/dL — ABNORMAL HIGH (ref 8–23)
CO2: 21 mmol/L — ABNORMAL LOW (ref 22–32)
CO2: 23 mmol/L (ref 22–32)
Calcium: 7.9 mg/dL — ABNORMAL LOW (ref 8.9–10.3)
Calcium: 9.4 mg/dL (ref 8.9–10.3)
Chloride: 107 mmol/L (ref 98–111)
Chloride: 97 mmol/L — ABNORMAL LOW (ref 98–111)
Creatinine, Ser: 5.38 mg/dL — ABNORMAL HIGH (ref 0.61–1.24)
Creatinine, Ser: 6.42 mg/dL — ABNORMAL HIGH (ref 0.61–1.24)
GFR, Estimated: 11 mL/min — ABNORMAL LOW (ref >60)
GFR, Estimated: 9 mL/min — ABNORMAL LOW (ref >60)
Glucose, Bld: 111 mg/dL — ABNORMAL HIGH (ref 70–99)
Glucose, Bld: 137 mg/dL — ABNORMAL HIGH (ref 70–99)
Potassium: 3.9 mmol/L (ref 3.5–5.1)
Potassium: 4.6 mmol/L (ref 3.5–5.1)
Sodium: 135 mmol/L (ref 135–145)
Sodium: 138 mmol/L (ref 135–145)
Total Bilirubin: 0.8 mg/dL (ref 0.3–1.2)
Total Bilirubin: 0.8 mg/dL (ref 0.3–1.2)
Total Protein: 6.5 g/dL (ref 6.5–8.1)
Total Protein: 6.9 g/dL (ref 6.5–8.1)

## 2022-12-17 LAB — BLOOD CULTURE ID PANEL (REFLEXED) - BCID2

## 2022-12-17 LAB — MRSA NEXT GEN BY PCR, NASAL: MRSA by PCR Next Gen: NOT DETECTED

## 2022-12-17 LAB — CBC WITH DIFFERENTIAL/PLATELET
Abs Immature Granulocytes: 0.04 10*3/uL (ref 0.00–0.07)
Basophils Absolute: 0.1 10*3/uL (ref 0.0–0.1)
Basophils Relative: 1 %
Eosinophils Absolute: 0.1 10*3/uL (ref 0.0–0.5)
Eosinophils Relative: 1 %
HCT: 41.3 % (ref 39.0–52.0)
Hemoglobin: 13.9 g/dL (ref 13.0–17.0)
Immature Granulocytes: 0 %
Lymphocytes Relative: 10 %
Lymphs Abs: 1 10*3/uL (ref 0.7–4.0)
MCH: 28 pg (ref 26.0–34.0)
MCHC: 33.7 g/dL (ref 30.0–36.0)
MCV: 83.1 fL (ref 80.0–100.0)
Monocytes Absolute: 1.3 10*3/uL — ABNORMAL HIGH (ref 0.1–1.0)
Monocytes Relative: 13 %
Neutro Abs: 7.4 10*3/uL (ref 1.7–7.7)
Neutrophils Relative %: 75 %
Platelets: 126 10*3/uL — ABNORMAL LOW (ref 150–400)
RBC: 4.97 MIL/uL (ref 4.22–5.81)
RDW: 17.2 % — ABNORMAL HIGH (ref 11.5–15.5)
WBC: 10 10*3/uL (ref 4.0–10.5)
nRBC: 0 % (ref 0.0–0.2)

## 2022-12-17 LAB — C-REACTIVE PROTEIN: CRP: 6.4 mg/dL — ABNORMAL HIGH (ref <1.0)

## 2022-12-17 LAB — BRAIN NATRIURETIC PEPTIDE: B Natriuretic Peptide: >4500 pg/mL — ABNORMAL HIGH (ref 0.0–100.0)

## 2022-12-17 LAB — PHOSPHORUS: Phosphorus: 4.3 mg/dL (ref 2.5–4.6)

## 2022-12-17 LAB — SEDIMENTATION RATE: Sed Rate: 10 mm/hr (ref 0–16)

## 2022-12-17 LAB — HEPATITIS B SURFACE ANTIGEN: Hepatitis B Surface Ag: NONREACTIVE

## 2022-12-17 MED ORDER — HEPARIN SODIUM (PORCINE) 5000 UNIT/ML IJ SOLN
5000.0000 [IU] | Freq: Three times a day (TID) | INTRAMUSCULAR | Status: DC
  Administered 2022-12-17 – 2022-12-18 (×4): 5000 [IU] via SUBCUTANEOUS
  Filled 2022-12-17 (×4): qty 1

## 2022-12-17 MED ORDER — SODIUM CHLORIDE 0.9 % IV SOLN: 2.0000 g | INTRAVENOUS | Status: DC

## 2022-12-17 MED ORDER — LABETALOL HCL 100 MG PO TABS
100.0000 mg | ORAL_TABLET | Freq: Every day | ORAL | Status: DC
  Administered 2022-12-17 – 2022-12-18 (×2): 100 mg via ORAL
  Filled 2022-12-17 (×2): qty 1

## 2022-12-17 MED ORDER — HEPARIN SODIUM (PORCINE) 1000 UNIT/ML DIALYSIS
5000.0000 [IU] | Freq: Once | INTRAMUSCULAR | Status: DC
  Filled 2022-12-17: qty 5

## 2022-12-17 MED ORDER — SODIUM CHLORIDE 0.9 % IV SOLN
2.0000 g | Freq: Two times a day (BID) | INTRAVENOUS | Status: DC
  Administered 2022-12-17 – 2022-12-18 (×2): 2 g via INTRAVENOUS
  Filled 2022-12-17 (×2): qty 2000

## 2022-12-17 MED ORDER — SODIUM CHLORIDE 0.9 % IV SOLN
2.0000 g | INTRAVENOUS | Status: AC
  Administered 2022-12-17: 2 g via INTRAVENOUS
  Filled 2022-12-17: qty 12.5

## 2022-12-17 MED ORDER — VANCOMYCIN HCL 1500 MG/300ML IV SOLN
1500.0000 mg | Freq: Once | INTRAVENOUS | Status: AC
  Administered 2022-12-17: 1500 mg via INTRAVENOUS
  Filled 2022-12-17: qty 300

## 2022-12-17 MED ORDER — OXYCODONE HCL 5 MG PO TABS
5.0000 mg | ORAL_TABLET | Freq: Three times a day (TID) | ORAL | Status: DC | PRN
  Administered 2022-12-17 – 2022-12-18 (×3): 5 mg via ORAL
  Filled 2022-12-17 (×3): qty 1

## 2022-12-17 MED ORDER — ALTEPLASE 2 MG IJ SOLR: 2.0000 mg | Freq: Once | INTRAMUSCULAR | Status: DC | PRN

## 2022-12-17 MED ORDER — CHLORHEXIDINE GLUCONATE CLOTH 2 % EX PADS
6.0000 | MEDICATED_PAD | Freq: Every day | CUTANEOUS | Status: DC
  Administered 2022-12-17: 6 via TOPICAL

## 2022-12-17 MED ORDER — ACETAMINOPHEN 325 MG PO TABS: 650.0000 mg | ORAL_TABLET | Freq: Four times a day (QID) | ORAL | Status: DC | PRN

## 2022-12-17 MED ORDER — ANTICOAGULANT SODIUM CITRATE 4% (200MG/5ML) IV SOLN: 5.0000 mL | Status: DC | PRN

## 2022-12-17 MED ORDER — HEPARIN SODIUM (PORCINE) 1000 UNIT/ML IJ SOLN
INTRAMUSCULAR | Status: AC
  Administered 2022-12-17: 5000 [IU] via INTRAVENOUS_CENTRAL
  Filled 2022-12-17: qty 1

## 2022-12-17 MED ORDER — VANCOMYCIN HCL IN DEXTROSE 1-5 GM/200ML-% IV SOLN: 1000.0000 mg | Freq: Once | INTRAVENOUS | Status: DC

## 2022-12-17 MED ORDER — METHOCARBAMOL 500 MG PO TABS
500.0000 mg | ORAL_TABLET | Freq: Four times a day (QID) | ORAL | Status: DC | PRN
  Administered 2022-12-17 – 2022-12-18 (×2): 500 mg via ORAL
  Filled 2022-12-17 (×2): qty 1

## 2022-12-17 MED ORDER — ISOSORBIDE MONONITRATE ER 30 MG PO TB24
30.0000 mg | ORAL_TABLET | Freq: Every day | ORAL | Status: DC
  Administered 2022-12-17 – 2022-12-18 (×2): 30 mg via ORAL
  Filled 2022-12-17 (×2): qty 1

## 2022-12-17 MED ORDER — FENTANYL CITRATE PF 50 MCG/ML IJ SOSY
50.0000 ug | PREFILLED_SYRINGE | Freq: Once | INTRAMUSCULAR | Status: AC
  Administered 2022-12-17: 50 ug via INTRAVENOUS
  Filled 2022-12-17: qty 1

## 2022-12-17 MED ORDER — MELATONIN 3 MG PO TABS
3.0000 mg | ORAL_TABLET | Freq: Every evening | ORAL | Status: DC | PRN
  Administered 2022-12-17: 3 mg via ORAL
  Filled 2022-12-17: qty 1

## 2022-12-17 MED ORDER — ACETAMINOPHEN 650 MG RE SUPP: 650.0000 mg | Freq: Four times a day (QID) | RECTAL | Status: DC | PRN

## 2022-12-17 MED ORDER — ONDANSETRON HCL 4 MG/2ML IJ SOLN: 4.0000 mg | Freq: Four times a day (QID) | INTRAMUSCULAR | Status: DC | PRN

## 2022-12-17 MED ORDER — FUROSEMIDE 10 MG/ML IJ SOLN
40.0000 mg | Freq: Once | INTRAMUSCULAR | Status: AC
  Administered 2022-12-17: 40 mg via INTRAVENOUS
  Filled 2022-12-17: qty 4

## 2022-12-17 MED ORDER — HYDRALAZINE HCL 25 MG PO TABS
25.0000 mg | ORAL_TABLET | Freq: Every day | ORAL | Status: DC
  Administered 2022-12-17 – 2022-12-18 (×2): 25 mg via ORAL
  Filled 2022-12-17 (×2): qty 1

## 2022-12-17 MED ORDER — HEPARIN SODIUM (PORCINE) 1000 UNIT/ML DIALYSIS
1000.0000 [IU] | INTRAMUSCULAR | Status: DC | PRN
Start: 2022-12-17 — End: 2022-12-17
  Administered 2022-12-17: 1000 [IU]

## 2022-12-17 MED ORDER — VANCOMYCIN HCL 750 MG/150ML IV SOLN: 750.0000 mg | INTRAVENOUS | Status: DC

## 2022-12-17 MED ORDER — ACETAMINOPHEN 325 MG PO TABS
650.0000 mg | ORAL_TABLET | Freq: Four times a day (QID) | ORAL | Status: DC
  Administered 2022-12-17 – 2022-12-18 (×4): 650 mg via ORAL
  Filled 2022-12-17 (×4): qty 2

## 2022-12-17 MED ORDER — FENTANYL CITRATE PF 50 MCG/ML IJ SOSY
25.0000 ug | PREFILLED_SYRINGE | INTRAMUSCULAR | Status: DC | PRN
  Administered 2022-12-17: 25 ug via INTRAVENOUS
  Filled 2022-12-17: qty 1

## 2022-12-17 MED ORDER — DICLOFENAC SODIUM 1 % EX GEL
2.0000 g | Freq: Three times a day (TID) | CUTANEOUS | Status: DC
  Administered 2022-12-17 – 2022-12-18 (×5): 2 g via TOPICAL
  Filled 2022-12-17: qty 100

## 2022-12-17 MED ORDER — LIDOCAINE 5 % EX PTCH: 1.0000 | MEDICATED_PATCH | CUTANEOUS | Status: DC

## 2022-12-17 MED ORDER — PANTOPRAZOLE SODIUM 40 MG PO TBEC
40.0000 mg | DELAYED_RELEASE_TABLET | Freq: Every day | ORAL | Status: DC
  Administered 2022-12-17: 40 mg via ORAL
  Filled 2022-12-17 (×2): qty 1

## 2022-12-17 MED ORDER — IOHEXOL 350 MG/ML SOLN
100.0000 mL | Freq: Once | INTRAVENOUS | Status: AC | PRN
  Administered 2022-12-17: 100 mL via INTRAVENOUS

## 2022-12-17 MED ORDER — HEPARIN SODIUM (PORCINE) 1000 UNIT/ML DIALYSIS: 1000.0000 [IU] | INTRAMUSCULAR | Status: DC | PRN

## 2022-12-17 MED ORDER — FUROSEMIDE 40 MG PO TABS
80.0000 mg | ORAL_TABLET | Freq: Every day | ORAL | Status: DC
  Administered 2022-12-17 – 2022-12-18 (×2): 80 mg via ORAL
  Filled 2022-12-17 (×2): qty 2

## 2022-12-17 MED ORDER — NALOXONE HCL 0.4 MG/ML IJ SOLN: 0.4000 mg | INTRAMUSCULAR | Status: DC | PRN

## 2022-12-17 MED ORDER — LIDOCAINE 5 % EX PTCH: 1.0000 | MEDICATED_PATCH | Freq: Once | CUTANEOUS | Status: DC | PRN

## 2022-12-17 MED ORDER — TAMSULOSIN HCL 0.4 MG PO CAPS
0.8000 mg | ORAL_CAPSULE | Freq: Every day | ORAL | Status: DC
  Administered 2022-12-17 – 2022-12-18 (×2): 0.8 mg via ORAL
  Filled 2022-12-17 (×2): qty 2

## 2022-12-17 MED ORDER — SODIUM CHLORIDE 0.9 % IV SOLN
2.0000 g | INTRAVENOUS | Status: DC
  Administered 2022-12-17: 2 g via INTRAVENOUS
  Filled 2022-12-17: qty 12.5

## 2022-12-17 NOTE — H&P (Addendum)
History and Physical  Ronald Reeves GMW:[id number] DOB: 1955-10-20 DOA: 12/16/2022  Referring physician: Admitted by Dr. Arlean Hopping, [facility], Hospitalist medicine.  PCP: Ronald Core, MD  Outpatient Specialists: Vascular surgery, cardiology, nephrology. Patient coming from: Home.  Chief Complaint: Productive cough, worsening shortness of breath, and severe back pain.  HPI: Ronald Reeves is a 67 y.o. male with medical history significant for end-stage renal disease on HD TTS (still makes urine), HFrEF 45 to 50%, polysubstance abuse including cocaine and tobacco, hypertension, GERD, BPH who initially presented to [facility] ED with complaints of severe back pain, progressive dyspnea, and a productive cough.  Reports falling multiple times last week after his hemodialysis.  He thinks he may have injured his back during those falls.  His back pain has been severe since.    Endorses recent right upper extremity fistula formation 2 to 3 weeks ago but has not been used yet.  He has a temporary dialysis catheter in his left chest that has been used for his hemodialysis.  He was told that, if at dialysis they still cannot use his fistula, to present to vascular surgery's office today for further evaluation.  Vascular surgery has been consulted.    The patient also reports a productive cough for at least a week.  Associated with progressive dyspnea.  The patient is a current cocaine user.  A 2D echo has been ordered to rule out any acute cardiac abnormalities.  In the ED, the patient had a CT angio chest abdomen pelvis which revealed the following findings: 1. No aortic dissection or aneurysm. 2. Mild cardiomegaly with coronary vascular calcification primarily involving the LAD. 3. Faint bibasilar and lower lobe hazy and ground-glass densities concerning for edema. Atypical pneumonia is not excluded. 4. Sigmoid diverticulosis without active inflammatory changes. No bowel obstruction. 5. Aortic  Atherosclerosis (ICD10-I70.0) and Emphysema (ICD10-J43.9).  The patient was started on broad-spectrum IV antibiotics for HCAP, IV vancomycin and cefepime.  [facility], hospitalist service, was asked to admit.  Admitted by Dr. Arlean Hopping and transferred to [facility] progressive care unit as inpatient status.  At the time of this visit, the patient is somnolent but arouses to voices and answers questions appropriately with the help of his wife who is at bedside.  Endorses severe back pain.   ED Course: Temperature 99.  BP 125/69, pulse 88, respiration rate 26, O2 saturation 96% on room air.  Lab studies notable for serum bicarb 21, BUN 27, creatinine 5.38, calcium 7.9, albumin 2.9.  Review of Systems: Review of systems as noted in the HPI. All other systems reviewed and are negative.   Past Medical History:  Diagnosis Date   Anemia    Arthritis    Back pain    Bronchitis    Chronic kidney disease    dialysis on tues, thurs, sat   COPD (chronic obstructive pulmonary disease) (HCC)    Coronary artery disease    COVID    mild - flu like symptoms   Dyspnea    w/ exertion, uses inhaler   GERD (gastroesophageal reflux disease)    not a current problem   Heart murmur    never has caused any problems   Hypertension    Myocardial infarction (HCC)    Pneumonia    x 1   Pre-diabetes    diet controlled, no meds, does not check blood sugar   Substance abuse (HCC)    Past Surgical History:  Procedure Laterality Date   AV FISTULA PLACEMENT Left  04/12/2021   Procedure: LEFT ARM ARTERIOVENOUS (AV) FISTULA CREATION;  Surgeon: Cephus Shelling, MD;  Location: [facility] OR;  Service: Vascular;  Laterality: Left;   AV FISTULA PLACEMENT Left 10/18/2021   Procedure: LEFT ARM BRACHIOBASILIC FISTULA CREATION FIRST STAGE;  Surgeon: Cephus Shelling, MD;  Location: [facility] OR;  Service: Vascular;  Laterality: Left;   AV FISTULA PLACEMENT Right 11/28/2022   Procedure: RIGHT ARM FIRST STAGE BRACHIOBASILIC  FISTULA CREATION;  Surgeon: Cephus Shelling, MD;  Location: [facility] OR;  Service: Vascular;  Laterality: Right;   BACK SURGERY     BASCILIC VEIN TRANSPOSITION Left 01/27/2022   Procedure: LEFT SECOND STAGE BASILIC VEIN TRANSPOSITION;  Surgeon: Cephus Shelling, MD;  Location: [facility] OR;  Service: Vascular;  Laterality: Left;   BIOPSY  08/20/2022   Procedure: BIOPSY;  Surgeon: Shellia Cleverly, DO;  Location: [facility] ENDOSCOPY;  Service: Gastroenterology;;   COLONOSCOPY WITH PROPOFOL N/A 08/20/2022   Procedure: COLONOSCOPY WITH PROPOFOL;  Surgeon: Shellia Cleverly, DO;  Location: [facility];  Service: Gastroenterology;  Laterality: N/A;   ESOPHAGOGASTRODUODENOSCOPY (EGD) WITH PROPOFOL N/A 08/20/2022   Procedure: ESOPHAGOGASTRODUODENOSCOPY (EGD) WITH PROPOFOL;  Surgeon: Shellia Cleverly, DO;  Location: [facility];  Service: Gastroenterology;  Laterality: N/A;   INSERTION OF DIALYSIS CATHETER Right 05/01/2022   Procedure: INSERTION OF DIALYSIS CATHETER;  Surgeon: [name], MD;  Location: [facility] OR;  Service: Vascular;  Laterality: Right;   LIGATION OF COMPETING BRANCHES OF ARTERIOVENOUS FISTULA Left 07/15/2021   Procedure: LIGATION OF COMPETING BRANCHES OF LEFT RADIOCEPHALIC ARTERIOVENOUS FISTULA TIMES TWO;  Surgeon: Cephus Shelling, MD;  Location: [facility] OR;  Service: Vascular;  Laterality: Left;   POLYPECTOMY  08/20/2022   Procedure: POLYPECTOMY;  Surgeon: Shellia Cleverly, DO;  Location: [facility];  Service: Gastroenterology;;   REVISON OF ARTERIOVENOUS FISTULA Left 07/15/2021   Procedure: REVISON OF LEFT ARTERIOVENOUS FISTULA;  Surgeon: Cephus Shelling, MD;  Location: [facility] OR;  Service: Vascular;  Laterality: Left;   REVISON OF ARTERIOVENOUS FISTULA Left 05/01/2022   Procedure: ARTERIOVENOUS FISTULA WASHOUT OF ARM HEMATOMA;  Surgeon: [name], MD;  Location: [facility] OR;  Service: Vascular;  Laterality: Left;    Social History:  reports that he has been smoking cigars and  cigarettes. He has been smoking an average of .5 packs per day. He has been exposed to tobacco smoke. He has never used smokeless tobacco. He reports current alcohol use of about 14.0 standard drinks of alcohol per week. He reports current drug use. Frequency: 1.00 time per week. Drugs: Marijuana and Cocaine.   No Known Allergies  Family History  Problem Relation Age of Onset   Heart disease Mother    Heart attack Sister 84      Prior to Admission medications   Medication Sig Start Date End Date Taking? Authorizing Provider  albuterol (VENTOLIN HFA) 108 (90 Base) MCG/ACT inhaler Inhale 2 puffs into the lungs every 6 (six) hours as needed for wheezing or shortness of breath.    [provider]  amLODipine (NORVASC) 10 MG tablet Take 1 tablet (10 mg total) by mouth daily. 01/01/18   Lewayne Bunting, MD  Blood Pressure KIT Please check blood pressure every day, and keep a log. 12/07/22   Arrien, York Ram, MD  Ergocalciferol (VITAMIN D2 PO) Take by mouth.    [provider]  furosemide (LASIX) 40 MG tablet Take 80 mg by mouth daily.    [provider]  hydrALAZINE (APRESOLINE) 25 MG tablet  Take 1 tablet (25 mg total) by mouth daily. 12/07/22   Arrien, York Ram, MD  isosorbide mononitrate (IMDUR) 30 MG 24 hr tablet Take 1 tablet (30 mg total) by mouth daily. 01/01/18   Lewayne Bunting, MD  labetalol (NORMODYNE) 100 MG tablet Take 2 tablets (200 mg total) by mouth every morning. Patient taking differently: Take 100 mg by mouth daily. 01/01/18   Lewayne Bunting, MD  Nutritional Supplements (ENSURE CLEAR) LIQD Take 1 Bottle by mouth daily.    [provider]  omeprazole (PRILOSEC) 40 MG capsule Take 1 capsule (40 mg total) by mouth daily. Patient taking differently: Take 40 mg by mouth daily as needed (heartburn). 06/06/22   Arnaldo Natal, NP  oxyCODONE-acetaminophen (PERCOCET) 5-325 MG tablet Take 1 tablet by mouth every 6 (six) hours  as needed for severe pain. 11/28/22   Rhyne, Ames Coupe, PA-C  tamsulosin (FLOMAX) 0.4 MG CAPS capsule Take 0.8 mg by mouth in the morning.    [provider]    Physical Exam: BP 125/69 (BP Location: Left Arm)   Pulse 88   Temp 99 F (37.2 C) (Oral)   Resp 20   Ht 5\' 10"  (1.778 m)   Wt 62.1 kg   SpO2 97%   BMI 19.64 kg/m   General: 67 y.o. year-old male well developed well nourished in no acute distress.  Somnolent but arousable to voices.  Responds to questions appropriately.   Cardiovascular: Regular rate and rhythm with no rubs or gallops.  No thyromegaly or JVD noted.  No lower extremity edema. 2/4 pulses in all 4 extremities. Respiratory: Diffuse rales bilaterally with poor inspiratory effort. Abdomen: Soft nontender nondistended with normal bowel sounds x4 quadrants. Muskuloskeletal: No cyanosis, clubbing or edema noted bilaterally Neuro: CN II-XII intact, strength, sensation, reflexes Skin: No ulcerative lesions noted or rashes Psychiatry: Judgement and insight appear normal. Mood is appropriate for condition and setting          Labs on Admission:  Basic Metabolic Panel: Recent Labs  Lab 12/16/22 2337  NA 138  K 3.9  CL 107  CO2 21*  GLUCOSE 111*  BUN 27*  CREATININE 5.38*  CALCIUM 7.9*   Liver Function Tests: Recent Labs  Lab 12/16/22 2337  AST 20  ALT 14  ALKPHOS 52  BILITOT 0.8  PROT 6.5  ALBUMIN 2.9*   No results for input(s): "LIPASE", "AMYLASE" in the last 168 hours. No results for input(s): "AMMONIA" in the last 168 hours. CBC: Recent Labs  Lab 12/16/22 2337  WBC 8.6  NEUTROABS 6.1  HGB 13.4  HCT 42.2  MCV 86.5  PLT 129*   Cardiac Enzymes: No results for input(s): "CKTOTAL", "CKMB", "CKMBINDEX", "TROPONINI" in the last 168 hours.  BNP (last 3 results) No results for input(s): "BNP" in the last 8760 hours.  ProBNP (last 3 results) No results for input(s): "PROBNP" in the last 8760 hours.  CBG: No results for input(s):  "GLUCAP" in the last 168 hours.  Radiological Exams on Admission: CT Angio Chest/Abd/Pel for Dissection W and/or W/WO  Result Date: 12/17/2022 CLINICAL DATA:  Severe back pain. EXAM: CT ANGIOGRAPHY CHEST, ABDOMEN AND PELVIS TECHNIQUE: Non-contrast CT of the chest was initially obtained. Multidetector CT imaging through the chest, abdomen and pelvis was performed using the standard protocol during bolus administration of intravenous contrast. Multiplanar reconstructed images and MIPs were obtained and reviewed to evaluate the vascular anatomy. RADIATION DOSE REDUCTION: This exam was performed according to the departmental dose-optimization program  which includes automated exposure control, adjustment of the mA and/or kV according to patient size and/or use of iterative reconstruction technique. CONTRAST:  OMNIPAQUE IOHEXOL 350 MG/ML SOLN COMPARISON:  Chest radiograph dated 12/16/2022 and CT abdomen pelvis dated 08/05/2022. FINDINGS: Evaluation of this exam is limited due to respiratory motion artifact as well as streak artifact caused by spinal hardware.oh CTA CHEST FINDINGS Cardiovascular: There is mild cardiomegaly. No pericardial effusion. There is coronary vascular calcification primarily involving the LAD. Left IJ central venous line with tip at the junction of the right atrium and IVC. There is mild atherosclerotic calcification of the thoracic aorta. No aneurysmal dilatation or dissection. The origins of the great vessels of the aortic arch appear patent. No pulmonary artery embolus identified. Mediastinum/Nodes: No hilar or mediastinal adenopathy. Mild thickened appearance of the esophagus. Clinical correlation is recommended to evaluate for possibility of esophagitis. Small amount of residual fluid and ingested content noted in the esophagus. No mediastinal fluid collection. Lungs/Pleura: There is mild paraseptal emphysema. Faint bibasilar and lower lobe hazy and ground-glass densities concerning  for edema. Atypical pneumonia is not excluded. Clinical correlation is recommended. No focal consolidation, pleural effusion, or pneumothorax. Linear atelectasis/scarring at the left lung base. The central airways are patent. Musculoskeletal: Degenerative changes of the spine. No acute osseous pathology. Review of the MIP images confirms the above findings. CTA ABDOMEN AND PELVIS FINDINGS VASCULAR Aorta: Mild atherosclerotic calcification. No aneurysmal dilatation or dissection. Celiac: Patent without evidence of aneurysm, dissection, vasculitis or significant stenosis. SMA: Atherosclerotic calcification of the SMA. The SMA remains patent. Renals: There is atherosclerotic calcification of the origin of the right renal artery. The renal arteries remain patent however, with diminished flow. IMA: The IMA is patent. Inflow: Mild atherosclerotic calcification of the iliac arteries. The iliac arteries are patent. No aneurysmal dilatation or dissection. Veins: The IVC is unremarkable.  No fullness gas. Review of the MIP images confirms the above findings. NON-VASCULAR No intra-abdominal free air.  Small free fluid within the pelvis. Hepatobiliary: The liver is unremarkable. No biliary dilatation. The gallbladder is unremarkable. Pancreas: The pancreas is grossly unremarkable. Spleen: Normal in size without focal abnormality. Adrenals/Urinary Tract: Probable right adrenal thickening/hyperplasia. The left adrenal gland is poorly visualized. There is moderate bilateral renal parenchyma atrophy. Small left renal interpolar cyst. There is no hydronephrosis on either side. The urinary bladder is grossly unremarkable. Stomach/Bowel: There is sigmoid diverticulosis without active inflammatory changes. There is no bowel obstruction or active inflammation. The appendix is unremarkable as visualized. Lymphatic: No adenopathy. Reproductive: The prostate and seminal vesicles are grossly remarkable. Other: None Musculoskeletal:  Degenerative changes of the spine. Multilevel laminectomy and posterior fusion. No acute osseous pathology. Review of the MIP images confirms the above findings. IMPRESSION: 1. No aortic dissection or aneurysm. 2. Mild cardiomegaly with coronary vascular calcification primarily involving the LAD. 3. Faint bibasilar and lower lobe hazy and ground-glass densities concerning for edema. Atypical pneumonia is not excluded. 4. Sigmoid diverticulosis without active inflammatory changes. No bowel obstruction. 5. Aortic Atherosclerosis (ICD10-I70.0) and Emphysema (ICD10-J43.9). Electronically Signed   By: Elgie Collard M.D.   On: 12/17/2022 01:28   DG Hip Unilat W or Wo Pelvis 2-3 Views Left  Result Date: 12/16/2022 CLINICAL DATA:  Left hip pain. EXAM: DG HIP (WITH OR WITHOUT PELVIS) 2-3V LEFT COMPARISON:  None Available. FINDINGS: Mild bilateral hip joint space narrowing with acetabular spurring. No fracture. No erosion, evidence of a vascular necrosis or focal bone abnormality. Pubic symphysis and sacroiliac joints are  congruent. Postsurgical and degenerative change in the lower lumbar spine, partially included in the field of view. IMPRESSION: Mild bilateral hip osteoarthritis. Electronically Signed   By: Narda Rutherford M.D.   On: 12/16/2022 23:42   DG Chest Port 1 View  Result Date: 12/16/2022 CLINICAL DATA:  Severe upper back pain with cough. EXAM: PORTABLE CHEST 1 VIEW COMPARISON:  Chest x-ray 12/06/2022 FINDINGS: Patient is rotated. Left-sided central venous catheter tip projects over the right atrium, unchanged. The heart is enlarged similar to the prior study. Left lung base has been excluded from the examination. Visualized lungs are clear. There is no definite pleural effusion or pneumothorax. No acute fractures are seen. IMPRESSION: Cardiomegaly. No acute pulmonary process. Left lung base has been excluded from the examination. Electronically Signed   By: Darliss Cheney M.D.   On: 12/16/2022 22:52     EKG: I independently viewed the EKG done and my findings are as followed: Sinus rhythm rate of 87.  Nonspecific ST-T changes.  QTc 472  Assessment/Plan Present on Admission:  Acute on chronic systolic heart failure (HCC)  Principal Problem:   Acute on chronic systolic heart failure (HCC)  Presumptive acute on chronic systolic CHF Euvolemic on exam Possible pulmonary edema on CT scan versus pneumonia, leading more towards pneumonia Obtain BNP to support the diagnosis of acute on chronic systolic CHF Follow 2D echo, ordered due to progressive dyspnea. The patient still makes urine, resume home furosemide. Start strict I's and O's and daily weight  HCAP, POA Presents with a productive cough Personally reviewed CT scan done at [facility] ED infiltrates noted bilaterally Started on IV vancomycin and cefepime in the ED, continue Obtain procalcitonin level and sputum culture MRSA screen negative. Follow peripheral blood cultures  Progressive dyspnea, rule out cardiogenic cause Coronary artery disease Cocaine abuse Follow 2D echo Consult cardiology in the morning Monitor on telemetry  Back pain Degenerative changes of the spine no acute osseous pathology.   Multilevel laminectomy and posterior fusion.  No acute osseous pathology.   No aortic dissection or aneurysm. As needed analgesics, lidocaine patch.  ESRD on HD TTS Right upper extremity fistula Last HD session was on 12/16/2022 Volume status and electrolytes managed with hemodialysis Continue IV antibiotics for HCAP. Nephrology consulted by EDP. Consult vascular surgery to assess right upper extremity fistula  Hypertension BPs are currently soft Resume oral antihypertensives when blood pressure allows. Closely monitor vital signs  GERD Resume home PPI  BPH Resume home tamsulosin.  Polysubstance abuse including tobacco and cocaine Discussed with his wife at bedside she would like to see him quit TOC  consulted to provide resources  Physical debility with recent falls PT OT assessment Fall precautions    DVT prophylaxis: Subcu heparin 3 times daily  Code Status: Full code  Family Communication: Updated the patient's wife at bedside.  Disposition Plan: Admitted to progressive care unit.  Consults called: Nephrology, cardiology, vascular surgery.  Admission status: Inpatient status.   Status is: Inpatient The patient requires at least 2 midnights for further evaluation and treatment of present condition.   Darlin Drop MD [facility] Pager [phone]  If 7PM-7AM, please contact night-coverage [email or web address] Password [email or web address]  12/17/2022, 5:02 AM

## 2022-12-17 NOTE — Progress Notes (Signed)
Carryover admission to the Day Admitter at [facility] vs admitter at [facility] (depending upon when patient is transferred from [facility] ED to [facility]); end-stage renal disease (on HD) patient who is being admitted with acute on chronic systolic heart failure as well as HCAP pneumonia; EDP has notified on-call nephrology, Dr. Malen Gauze, requesting formal consultation.   I discussed this case with the EDP, Dr. Nicanor Alcon.  Per these discussions:   This is a 67 year old male with end-stage renal disease on hemodialysis, chronic systolic heart failure, with EF 45 to 50% on echocardiogram in November 2019, who is being admitted with acute on chronic systolic heart failure as well as concern for pneumonia after presenting with shortness of breath, with ensuing CT scan showing evidence of pulmonary edema as well as question of pneumonia.  As he is an end-stage renal disease patient on hemodialysis, started on broad-spectrum IV antibiotics in the form of IV vancomycin and cefepime.  He also has chronic back discomfort, with MRI scheduled in the near future as an outpatient.  I have placed an order for admission to PCU at [facility] for further evaluation management of the above.  I have placed some additional preliminary admit orders via the adult multi-morbid admission order set. I have also ordered continuation of IV vancomycin and cefepime.  Ordered morning labs in the form of CMP, CBC, as well as serum magnesium and phosphorus levels.  For his chronic back pain I have ordered prn IV fentanyl.    Newton Pigg, DO Hospitalist

## 2022-12-17 NOTE — Progress Notes (Signed)
OT Cancellation Note  Patient Details Name: Ronald Reeves MRN: [id number] DOB: [birth year]   Cancelled Treatment:    Reason Eval/Treat Not Completed: Patient at procedure or test/ unavailable (at HD)  Mateo Flow 12/17/2022, 1:34 PM

## 2022-12-17 NOTE — ED Notes (Signed)
[facility] called for transport 

## 2022-12-17 NOTE — Progress Notes (Signed)
POST HD TX NOTE  12/17/22 1655  Vitals  Temp 98.5 F (36.9 C)  Temp Source Oral  BP (!) 148/86  MAP (mmHg) 99  BP Location Left Arm  BP Method Automatic  Patient Position (if appropriate) Lying  Pulse Rate 92  Pulse Rate Source Monitor  ECG Heart Rate 94  Resp (!) 26  Oxygen Therapy  SpO2 96 %  O2 Device Room Air  Pulse Oximetry Type Continuous  During Treatment Monitoring  Intra-Hemodialysis Comments (S)   (post HD tx VS check)  Post Treatment  Dialyzer Clearance Lightly streaked  Duration of HD Treatment -hour(s) 2.5 hour(s) (2hr 23 min)  Hemodialysis Intake (mL) 0 mL  Liters Processed 70.8  Fluid Removed (mL) 1900 mL  Tolerated HD Treatment Yes (until he started cramping)  Post-Hemodialysis Comments (S)  tx ended 33 min early d/t pt c/o cramping, didn't want any interventions, stated he wanted to end the tx. UF goal not met, blood rinsed back; VSS: Medication Admin: Heparin 5000 units bolus at beginning of tx, Heparin DWell 4200 units  Hemodialysis Catheter Right Subclavian Double lumen Permanent (Tunneled)  Placement Date/Time: 05/01/22 1028   Serial / Lot #: [id number]  Expiration Date: 01/11/27  Time Out: Correct patient;Correct site;Correct procedure  Maximum sterile barrier precautions: Hand hygiene;Cap;Mask;Sterile gown;Sterile gloves;Large sterile ...  Site Condition No complications  Blue Lumen Status Heparin locked;Dead end cap in place  Red Lumen Status Heparin locked;Dead end cap in place  Purple Lumen Status N/A  Catheter fill solution Heparin 1000 units/ml  Catheter fill volume (Arterial) 2.1 cc  Catheter fill volume (Venous) 2.1  Dressing Type Transparent  Dressing Status Antimicrobial disc in place;Clean, Dry, Intact  Drainage Description None  Dressing Change Due 12/24/22  Post treatment catheter status Capped and Clamped

## 2022-12-17 NOTE — Progress Notes (Signed)
    I evaluated the patient's right arm fistula which is a for stage basilic vein and should not be cannulated until it is superficial lysed.  He has scheduled follow-up in our office early next month to discuss second stage basilic vein fistula creation.  In the interim he should continue to use the catheter for dialysis.    Vincen Bejar C. Randie Heinz, MD [facility] Office: [phone] Pager: [phone]

## 2022-12-17 NOTE — Progress Notes (Signed)
Pharmacy Antibiotic Note  Ronald Reeves is a 67 y.o. male admitted on 12/16/2022 with possible pneumonia.   Atypical PNA vs. Edema on chest CT. Pharmacy has been consulted for Cefepime + Vancomycin dosing.  ESRD on HD TTS.  Completed HD session 5/7 prior to reporting to ED.  Plan: Cefepime 2gm IV qHD  Vancomycin 1500mg  IV x1 then 750mg  IV qHD Check MRSA PCR F/U HD schedule & micro data  Height: 5\' 10"  (177.8 cm) Weight: 65 kg (143 lb 4.8 oz) IBW/kg (Calculated) : 73  Temp (24hrs), Avg:98.8 F (37.1 C), Min:98.6 F (37 C), Max:98.9 F (37.2 C)  Recent Labs  Lab 12/16/22 2337  WBC 8.6  CREATININE 5.38*    Estimated Creatinine Clearance: 12.6 mL/min (A) (by C-G formula based on SCr of 5.38 mg/dL (H)).    No Known Allergies  Antimicrobials this admission: 5/8 Cefepime >>  5/8 Vancomycin >>   Dose adjustments this admission:  Microbiology results: 5/7 BCx:  MRSA PCR:   Thank you for allowing pharmacy to be a part of this patient's care.  Junita Push PharmD 12/17/2022 2:29 AM

## 2022-12-17 NOTE — Evaluation (Signed)
Physical Therapy Evaluation Patient Details Name: Ronald Reeves MRN: [id number] DOB: [birth year] Today's Date: 12/17/2022  History of Present Illness  67 y.o. male presents to [facility] hospital on 12/16/2022 with complaints of severe back pain, dyspnea and cough. Pt reports multiple falls recently. Workup concerning for CHF exacerbation and CAP. PMH includes ESRD, HFrEF, polysubstance abuse, HTN, GERD, BPH.  Clinical Impression  Pt presents to PT with deficits in gait, activity tolerance, endurance, stength, power. Pt reports low back pain which radiates into LLE, this limits tolerance for mobility at this time. Currently pt is able to ambulate for household distances with a RW. Pt is in agreement with PT that frequent mobilization may aide in reducing muscle guarding in and improve mobility quality. PT encourages frequent mobilization at this time. PT recommends outpatient PT at the time of discharge if the pt's back pain continues to persist.       Recommendations for follow up therapy are one component of a multi-disciplinary discharge planning process, led by the attending physician.  Recommendations may be updated based on patient status, additional functional criteria and insurance authorization.  Follow Up Recommendations       Assistance Recommended at Discharge Intermittent Supervision/Assistance  Patient can return home with the following  A little help with bathing/dressing/bathroom;Assist for transportation;Help with stairs or ramp for entrance;Assistance with cooking/housework    Equipment Recommendations None recommended by PT  Recommendations for Other Services       Functional Status Assessment Patient has had a recent decline in their functional status and demonstrates the ability to make significant improvements in function in a reasonable and predictable amount of time.     Precautions / Restrictions Precautions Precautions: Fall Restrictions Weight Bearing Restrictions: No       Mobility  Bed Mobility Overal bed mobility: Needs Assistance Bed Mobility: Sit to Sidelying, Rolling, Sidelying to Sit Rolling: Supervision Sidelying to sit: Min assist     Sit to sidelying: Min assist      Transfers Overall transfer level: Needs assistance Equipment used: Rolling walker (2 wheels) Transfers: Sit to/from Stand Sit to Stand: Min guard                Ambulation/Gait Ambulation/Gait assistance: Min guard Gait Distance (Feet): 100 Feet Assistive device: Rolling walker (2 wheels) Gait Pattern/deviations: Step-through pattern Gait velocity: reduced Gait velocity interpretation: <1.8 ft/sec, indicate of risk for recurrent falls   General Gait Details: slowed step-through gait, increased trunk flexion due to increased pain with hip/trunk extension  Stairs            Wheelchair Mobility    Modified Rankin (Stroke Patients Only)       Balance Overall balance assessment: Needs assistance Sitting-balance support: No upper extremity supported, Feet supported Sitting balance-Leahy Scale: Good     Standing balance support: Single extremity supported, Reliant on assistive device for balance Standing balance-Leahy Scale: Poor                               Pertinent Vitals/Pain Pain Assessment Pain Assessment: 0-10 Pain Score: 8  Pain Location: low back Pain Descriptors / Indicators: Aching, Radiating Pain Intervention(s): Monitored during session    Home Living Family/patient expects to be discharged to:: Private residence Living Arrangements: Spouse/significant other Available Help at Discharge: Family;Available 24 hours/day Type of Home: House Home Access: Stairs to enter Entrance Stairs-Rails: None Entrance Stairs-Number of Steps: 3   Home Layout: One level Home  Equipment: Rollator (4 wheels);Cane - single point      Prior Function Prior Level of Function : Independent/Modified Independent;Driving              Mobility Comments: ambulates with SPC       Hand Dominance        Extremity/Trunk Assessment   Upper Extremity Assessment Upper Extremity Assessment: Overall WFL for tasks assessed    Lower Extremity Assessment Lower Extremity Assessment: LLE deficits/detail LLE Deficits / Details: LLE hip flexion and knee extension 4-/5, ankle PF/DF WFL    Cervical / Trunk Assessment Cervical / Trunk Assessment: Kyphotic  Communication   Communication: No difficulties  Cognition Arousal/Alertness: Awake/alert Behavior During Therapy: WFL for tasks assessed/performed Overall Cognitive Status: Within Functional Limits for tasks assessed                                          General Comments General comments (skin integrity, edema, etc.): VSS on RA    Exercises     Assessment/Plan    PT Assessment Patient needs continued PT services  PT Problem List Decreased strength;Decreased activity tolerance;Decreased balance;Decreased mobility;Decreased knowledge of use of DME;Pain       PT Treatment Interventions DME instruction;Gait training;Stair training;Functional mobility training;Therapeutic activities;Therapeutic exercise;Balance training;Neuromuscular re-education;Patient/family education    PT Goals (Current goals can be found in the Care Plan section)  Acute Rehab PT Goals Patient Stated Goal: to go home PT Goal Formulation: With patient Time For Goal Achievement: 12/31/22 Potential to Achieve Goals: Good    Frequency Min 3X/week     Co-evaluation               AM-PAC PT "6 Clicks" Mobility  Outcome Measure Help needed turning from your back to your side while in a flat bed without using bedrails?: A Little Help needed moving from lying on your back to sitting on the side of a flat bed without using bedrails?: A Little Help needed moving to and from a bed to a chair (including a wheelchair)?: A Little Help needed standing up from a chair using  your arms (e.g., wheelchair or bedside chair)?: A Little Help needed to walk in hospital room?: A Little Help needed climbing 3-5 steps with a railing? : A Lot 6 Click Score: 17    End of Session Equipment Utilized During Treatment: Gait belt Activity Tolerance: Patient tolerated treatment well Patient left: in bed;with call bell/phone within reach;with family/visitor present Nurse Communication: Mobility status PT Visit Diagnosis: Other abnormalities of gait and mobility (R26.89);Muscle weakness (generalized) (M62.81);Pain Pain - part of body:  (back and LLE)    Time: 1127-1150 PT Time Calculation (min) (ACUTE ONLY): 23 min   Charges:   PT Evaluation $PT Eval Low Complexity: 1 Low          Arlyss Gandy, PT, DPT Acute Rehabilitation Office [phone]   Arlyss Gandy 12/17/2022, 12:14 PM

## 2022-12-17 NOTE — Progress Notes (Signed)
Heart Failure Navigator Progress Note  Assessed for Heart & Vascular TOC clinic readiness.  Patient does not meet criteria due to ESRD on hemodialysis.   Navigator will sign off at this time.    Kano Heckmann, BSN, RN Heart Failure Nurse Navigator Secure Chat Only   

## 2022-12-17 NOTE — Progress Notes (Signed)
PROGRESS NOTE  Ronald Reeves ZOX:[id number] DOB: 07/30/1956   PCP: Benjiman Core, MD  Patient is from: Home.  Lives with his wife.  Uses rolling walker and cane at baseline.  DOA: 12/16/2022 LOS: 0  Chief complaints Chief Complaint  Patient presents with   Back Pain     Brief Narrative / Interim history: 67 year old M with PMH of ESRD on HD TTS, HFrEF, chronic back pain, polysubstance use (cocaine, alcohol, tobacco, marijuana)  HTN, GERD and BPH presented to [facility] with lower back pain, productive cough and progressive dyspnea, and admitted for CHF exacerbation, HCAP and lower back pain.  Reportedly had multiple falls at outpatient HD last week.  CXR with cardiomegaly but no acute pulmonary process.  CT angio chest/abdomen/pelvis without significant finding.  Nephrology and cardiology consulted.  CT lumbar spine without acute finding.    Subjective: Seen and examined earlier this morning.  No major events overnight of this morning.  Continues to endorse low back pain radiating down his left leg.  Denies chest pain.  Cough and shortness of breath is improved.  Objective: Vitals:   12/17/22 0327 12/17/22 0447 12/17/22 0449 12/17/22 0731  BP:  125/69  (!) 126/106  Pulse:  88  85  Resp:  (!) 26 20 (!) 22  Temp: 98.6 F (37 C) 99 F (37.2 C)  99.4 F (37.4 C)  TempSrc: Oral Oral    SpO2:    94%  Weight:  62.1 kg    Height:  5\' 10"  (1.778 m)      Examination:  GENERAL: No apparent distress.  Nontoxic. HEENT: MMM.  Vision and hearing grossly intact.  NECK: Supple.  No apparent JVD.  RESP:  No IWOB.  Fair aeration bilaterally. CVS:  RRR. Heart sounds normal.  ABD/GI/GU: BS+. Abd soft, NTND.  MSK/EXT:  Moves extremities.  Relative weakness in LLE? SKIN: no apparent skin lesion or wound NEURO: Awake, alert and oriented appropriately.  Relative weakness in LLE? PSYCH: Calm. Normal affect.   Procedures:  None  Microbiology summarized: MRSA PCR screen  negative. Blood culture pending  Assessment and plan: Principal Problem:   Acute on chronic systolic heart failure (HCC)  Presumptive acute on chronic systolic CHF?  Appears euvolemic on exam.  CXR with cardiomegaly but no signs of pulmonary edema.  On room air.  CT chest/abdomen/pelvis without acute finding.  No fever or leukocytosis.  Respiratory symptoms improved. -Fluid management by dialysis -Check UDS -Follow TTE   HCAP, POA: Presents with progressive dyspnea and productive cough.  CXR without infiltrate but some concern on CT.  MRSA PCR screen negative. -Discontinue IV vancomycin -Continue IV cefepime   Coronary artery disease: No chest pain. -Follow cardiology rec -Continue home medications.   Acute on chronic back pain: CT lumbar spine with DDD, L3-4 fixation and L4-5 laminectomy.  CT angio chest/abdomen/pelvis without acute finding.  Has relatively more weakness in LLE. -Will trial low-dose Robaxin in addition to oxycodone -PT/OT   ESRD on HD TTS/RUE fistula Right upper extremity fistula -HD per nephrology   Hypertension: BP within acceptable range. -Resume home labetalol, Imdur and hydralazine -Continue holding amlodipine   GERD -Continue PPI   BPH -Continue tamsulosin.   Polysubstance abuse including tobacco and cocaine -Check UDS -Encouraged to refrain from substance use -TOC   Physical debility with recent falls -PT/OT   Nutrition Body mass index is 19.64 kg/m.           DVT prophylaxis:  heparin injection 5,000 Units Start: 12/17/22  0730 SCDs Start: 12/17/22 0222  Code Status: Full code Family Communication: Updated patient's wife at bedside Level of care: Progressive Status is: Inpatient Remains inpatient appropriate because: Possible pneumonia/heart failure/acute back pain   Final disposition: TBD Consultants:  Cardiology Nephrology  35 minutes with more than 50% spent in reviewing records, counseling patient/family and  coordinating care.   Sch Meds:  Scheduled Meds:  acetaminophen  650 mg Oral Q6H WA   Chlorhexidine Gluconate Cloth  6 each Topical Q0600   diclofenac Sodium  2 g Topical TID AC & HS   furosemide  80 mg Oral Daily   heparin injection (subcutaneous)  5,000 Units Subcutaneous Q8H   heparin  5,000 Units Dialysis Once in dialysis   hydrALAZINE  25 mg Oral Daily   isosorbide mononitrate  30 mg Oral Daily   labetalol  100 mg Oral Daily   pantoprazole  40 mg Oral Daily   tamsulosin  0.8 mg Oral Daily   Continuous Infusions:  anticoagulant sodium citrate     [START ON 12/18/2022] ceFEPime (MAXIPIME) IV     ceFEPime (MAXIPIME) IV     Followed by   Melene Muller ON 12/23/2022] ceFEPime (MAXIPIME) IV     PRN Meds:.alteplase, anticoagulant sodium citrate, heparin, iohexol, lidocaine, melatonin, naLOXone (NARCAN)  injection, ondansetron (ZOFRAN) IV, oxyCODONE  Antimicrobials: Anti-infectives (From admission, onward)    Start     Dose/Rate Route Frequency Ordered Stop   12/23/22 1200  ceFEPIme (MAXIPIME) 2 g in sodium chloride 0.9 % 100 mL IVPB       See Hyperspace for full Linked Orders Report.   2 g 200 mL/hr over 30 Minutes Intravenous Every T-Th-Sa (Hemodialysis) 12/17/22 1201     12/18/22 1200  vancomycin (VANCOREADY) IVPB 750 mg/150 mL  Status:  Discontinued        750 mg 150 mL/hr over 60 Minutes Intravenous Every T-Th-Sa (Hemodialysis) 12/17/22 0303 12/17/22 1147   12/18/22 1200  ceFEPIme (MAXIPIME) 2 g in sodium chloride 0.9 % 100 mL IVPB        2 g 200 mL/hr over 30 Minutes Intravenous Every T-Th-Sa (Hemodialysis) 12/17/22 0303     12/17/22 1300  ceFEPIme (MAXIPIME) 2 g in sodium chloride 0.9 % 100 mL IVPB       See Hyperspace for full Linked Orders Report.   2 g 200 mL/hr over 30 Minutes Intravenous Every M-W-F (Hemodialysis) 12/17/22 1201 12/19/22 1759   12/17/22 0245  vancomycin (VANCOREADY) IVPB 1500 mg/300 mL        1,500 mg 150 mL/hr over 120 Minutes Intravenous  Once 12/17/22  0244 12/17/22 1038   12/17/22 0215  vancomycin (VANCOCIN) IVPB 1000 mg/200 mL premix  Status:  Discontinued        1,000 mg 200 mL/hr over 60 Minutes Intravenous  Once 12/17/22 0200 12/17/22 0244   12/17/22 0215  ceFEPIme (MAXIPIME) 2 g in sodium chloride 0.9 % 100 mL IVPB        2 g 200 mL/hr over 30 Minutes Intravenous STAT 12/17/22 0200 12/17/22 0401        I have personally reviewed the following labs and images: CBC: Recent Labs  Lab 12/16/22 2337 12/17/22 0553  WBC 8.6 10.0  NEUTROABS 6.1 7.4  HGB 13.4 13.9  HCT 42.2 41.3  MCV 86.5 83.1  PLT 129* 126*   BMP &GFR Recent Labs  Lab 12/16/22 2337 12/17/22 0553  NA 138 135  K 3.9 4.6  CL 107 97*  CO2 21* 23  GLUCOSE  111* 137*  BUN 27* 31*  CREATININE 5.38* 6.42*  CALCIUM 7.9* 9.4  MG  --  2.2  PHOS  --  4.3   Estimated Creatinine Clearance: 10.1 mL/min (A) (by C-G formula based on SCr of 6.42 mg/dL (H)). Liver & Pancreas: Recent Labs  Lab 12/16/22 2337 12/17/22 0553  AST 20 20  ALT 14 16  ALKPHOS 52 60  BILITOT 0.8 0.8  PROT 6.5 6.9  ALBUMIN 2.9* 3.1*   No results for input(s): "LIPASE", "AMYLASE" in the last 168 hours. No results for input(s): "AMMONIA" in the last 168 hours. Diabetic: No results for input(s): "HGBA1C" in the last 72 hours. No results for input(s): "GLUCAP" in the last 168 hours. Cardiac Enzymes: No results for input(s): "CKTOTAL", "CKMB", "CKMBINDEX", "TROPONINI" in the last 168 hours. No results for input(s): "PROBNP" in the last 8760 hours. Coagulation Profile: Recent Labs  Lab 12/16/22 2337  INR 1.1   Thyroid Function Tests: No results for input(s): "TSH", "T4TOTAL", "FREET4", "T3FREE", "THYROIDAB" in the last 72 hours. Lipid Profile: No results for input(s): "CHOL", "HDL", "LDLCALC", "TRIG", "CHOLHDL", "LDLDIRECT" in the last 72 hours. Anemia Panel: No results for input(s): "VITAMINB12", "FOLATE", "FERRITIN", "TIBC", "IRON", "RETICCTPCT" in the last 72 hours. Urine  analysis:    Component Value Date/Time   COLORURINE YELLOW 07/18/2020 1043   APPEARANCEUR HAZY (A) 07/18/2020 1043   LABSPEC 1.008 07/18/2020 1043   PHURINE 6.0 07/18/2020 1043   GLUCOSEU NEGATIVE 07/18/2020 1043   HGBUR SMALL (A) 07/18/2020 1043   BILIRUBINUR NEGATIVE 07/18/2020 1043   KETONESUR NEGATIVE 07/18/2020 1043   PROTEINUR 100 (A) 07/18/2020 1043   UROBILINOGEN 1.0 05/20/2013 0532   NITRITE NEGATIVE 07/18/2020 1043   LEUKOCYTESUR LARGE (A) 07/18/2020 1043   Sepsis Labs: Invalid input(s): "PROCALCITONIN", "LACTICIDVEN"  Microbiology: Recent Results (from the past 240 hour(s))  MRSA Next Gen by PCR, Nasal     Status: None   Collection Time: 12/17/22  2:26 AM   Specimen: Nasal Mucosa; Nasal Swab  Result Value Ref Range Status   MRSA by PCR Next Gen NOT DETECTED NOT DETECTED Final    Comment: (NOTE) The GeneXpert MRSA Assay (FDA approved for NASAL specimens only), is one component of a comprehensive MRSA colonization surveillance program. It is not intended to diagnose MRSA infection nor to guide or monitor treatment for MRSA infections. Test performance is not FDA approved in patients less than 4 years old. Performed at [facility], [address] [place], [place] [address]     Radiology Studies: CT L-SPINE NO CHARGE  Result Date: 12/17/2022 CLINICAL DATA:  Back pain EXAM: CT LUMBAR SPINE WITHOUT CONTRAST TECHNIQUE: Multidetector CT imaging of the lumbar spine was performed without intravenous contrast administration. Multiplanar CT image reconstructions were also generated. RADIATION DOSE REDUCTION: This exam was performed according to the departmental dose-optimization program which includes automated exposure control, adjustment of the mA and/or kV according to patient size and/or use of iterative reconstruction technique. COMPARISON:  Lumbar spine radiographs 12/06/2022 FINDINGS: Segmentation: 5 lumbar type vertebrae. Alignment: First degree  anterolisthesis of L3 on L4.  Unchanged Vertebrae: The vertebral body heights are well preserved. No signs of acute fracture or subluxation. Status post bilateral pedicle screw and posterior rod fixation of L3-4. L4-5 laminectomy. Paraspinal and other soft tissues: Negative. Disc levels: Solid fusion of the L3-4 disc space. Large flowing ventral osteophytes identified at the remaining lumbar spine levels with relative preservation of the disc spaces. IMPRESSION: 1. No acute fracture or subluxation. 2.  Status post posterior hardware fixation of L3-4 and laminectomy at L4-5. Marland Kitchen Electronically Signed   By: Signa Kell M.D.   On: 12/17/2022 05:19   CT Angio Chest/Abd/Pel for Dissection W and/or W/WO  Result Date: 12/17/2022 CLINICAL DATA:  Severe back pain. EXAM: CT ANGIOGRAPHY CHEST, ABDOMEN AND PELVIS TECHNIQUE: Non-contrast CT of the chest was initially obtained. Multidetector CT imaging through the chest, abdomen and pelvis was performed using the standard protocol during bolus administration of intravenous contrast. Multiplanar reconstructed images and MIPs were obtained and reviewed to evaluate the vascular anatomy. RADIATION DOSE REDUCTION: This exam was performed according to the departmental dose-optimization program which includes automated exposure control, adjustment of the mA and/or kV according to patient size and/or use of iterative reconstruction technique. CONTRAST:  OMNIPAQUE IOHEXOL 350 MG/ML SOLN COMPARISON:  Chest radiograph dated 12/16/2022 and CT abdomen pelvis dated 08/05/2022. FINDINGS: Evaluation of this exam is limited due to respiratory motion artifact as well as streak artifact caused by spinal hardware.oh CTA CHEST FINDINGS Cardiovascular: There is mild cardiomegaly. No pericardial effusion. There is coronary vascular calcification primarily involving the LAD. Left IJ central venous line with tip at the junction of the right atrium and IVC. There is mild atherosclerotic  calcification of the thoracic aorta. No aneurysmal dilatation or dissection. The origins of the great vessels of the aortic arch appear patent. No pulmonary artery embolus identified. Mediastinum/Nodes: No hilar or mediastinal adenopathy. Mild thickened appearance of the esophagus. Clinical correlation is recommended to evaluate for possibility of esophagitis. Small amount of residual fluid and ingested content noted in the esophagus. No mediastinal fluid collection. Lungs/Pleura: There is mild paraseptal emphysema. Faint bibasilar and lower lobe hazy and ground-glass densities concerning for edema. Atypical pneumonia is not excluded. Clinical correlation is recommended. No focal consolidation, pleural effusion, or pneumothorax. Linear atelectasis/scarring at the left lung base. The central airways are patent. Musculoskeletal: Degenerative changes of the spine. No acute osseous pathology. Review of the MIP images confirms the above findings. CTA ABDOMEN AND PELVIS FINDINGS VASCULAR Aorta: Mild atherosclerotic calcification. No aneurysmal dilatation or dissection. Celiac: Patent without evidence of aneurysm, dissection, vasculitis or significant stenosis. SMA: Atherosclerotic calcification of the SMA. The SMA remains patent. Renals: There is atherosclerotic calcification of the origin of the right renal artery. The renal arteries remain patent however, with diminished flow. IMA: The IMA is patent. Inflow: Mild atherosclerotic calcification of the iliac arteries. The iliac arteries are patent. No aneurysmal dilatation or dissection. Veins: The IVC is unremarkable.  No fullness gas. Review of the MIP images confirms the above findings. NON-VASCULAR No intra-abdominal free air.  Small free fluid within the pelvis. Hepatobiliary: The liver is unremarkable. No biliary dilatation. The gallbladder is unremarkable. Pancreas: The pancreas is grossly unremarkable. Spleen: Normal in size without focal abnormality.  Adrenals/Urinary Tract: Probable right adrenal thickening/hyperplasia. The left adrenal gland is poorly visualized. There is moderate bilateral renal parenchyma atrophy. Small left renal interpolar cyst. There is no hydronephrosis on either side. The urinary bladder is grossly unremarkable. Stomach/Bowel: There is sigmoid diverticulosis without active inflammatory changes. There is no bowel obstruction or active inflammation. The appendix is unremarkable as visualized. Lymphatic: No adenopathy. Reproductive: The prostate and seminal vesicles are grossly remarkable. Other: None Musculoskeletal: Degenerative changes of the spine. Multilevel laminectomy and posterior fusion. No acute osseous pathology. Review of the MIP images confirms the above findings. IMPRESSION: 1. No aortic dissection or aneurysm. 2. Mild cardiomegaly with coronary vascular calcification primarily involving the LAD. 3. Faint bibasilar  and lower lobe hazy and ground-glass densities concerning for edema. Atypical pneumonia is not excluded. 4. Sigmoid diverticulosis without active inflammatory changes. No bowel obstruction. 5. Aortic Atherosclerosis (ICD10-I70.0) and Emphysema (ICD10-J43.9). Electronically Signed   By: Elgie Collard M.D.   On: 12/17/2022 01:28   DG Hip Unilat W or Wo Pelvis 2-3 Views Left  Result Date: 12/16/2022 CLINICAL DATA:  Left hip pain. EXAM: DG HIP (WITH OR WITHOUT PELVIS) 2-3V LEFT COMPARISON:  None Available. FINDINGS: Mild bilateral hip joint space narrowing with acetabular spurring. No fracture. No erosion, evidence of a vascular necrosis or focal bone abnormality. Pubic symphysis and sacroiliac joints are congruent. Postsurgical and degenerative change in the lower lumbar spine, partially included in the field of view. IMPRESSION: Mild bilateral hip osteoarthritis. Electronically Signed   By: Narda Rutherford M.D.   On: 12/16/2022 23:42   DG Chest Port 1 View  Result Date: 12/16/2022 CLINICAL DATA:  Severe  upper back pain with cough. EXAM: PORTABLE CHEST 1 VIEW COMPARISON:  Chest x-ray 12/06/2022 FINDINGS: Patient is rotated. Left-sided central venous catheter tip projects over the right atrium, unchanged. The heart is enlarged similar to the prior study. Left lung base has been excluded from the examination. Visualized lungs are clear. There is no definite pleural effusion or pneumothorax. No acute fractures are seen. IMPRESSION: Cardiomegaly. No acute pulmonary process. Left lung base has been excluded from the examination. Electronically Signed   By: Darliss Cheney M.D.   On: 12/16/2022 22:52      Tre Sanker T. Roisin Mones [facility]  If 7PM-7AM, please contact night-coverage [email or web address] 12/17/2022, 1:45 PM

## 2022-12-17 NOTE — Consult Note (Signed)
Cardiology Consultation   Patient ID: Ronald Reeves MRN: [id number]; DOB: [birth year]  Admit date: 12/16/2022 Date of Consult: 12/17/2022  PCP:  Benjiman Core, MD   [facility] Providers Cardiologist:  Remotely seen by Dr. Jens Som in 2019     Patient Profile:   Ronald Reeves is a 67 y.o. 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 Alanda Slim.  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  Patient is a active cocaine user.  He drink 1 beer per day and smokes 2 cigars on a daily basis.  He has a history of noncompliance.  He was seen in January 2019 for chest pain, he was not felt to be a great candidate for cardiac catheterization due to noncompliance history and risk for contrast nephropathy.  Echocardiogram in January 2019 showed EF 45 to 50%, however Dr. Tenny Craw felt EF should be around 50 to 55%, hypokinesis of the basal inferolateral and inferior myocardium, mild AI.  Myoview in January 2019 showed EF 38%, scar involving the inferior wall but no ischemia.  Options were reviewed with the patient and he opted for medical therapy.  He previously had a left radiocephalic fistula for end-stage renal disease and the dialysis, however this eventually failed.  He had right brachial basilic AV fistula creation surgery by Dr. Chestine Spore on 11/28/2022.  More recently, patient was admitted to the hospital from 4/27 - 4/28 due to dizziness and found to have significant orthostatic hypotension.  Apparently he missed out on dialysis session the week prior  therefore he underwent extra dialysis session for the week.  Symptom improved after soft IV hydration.  He returned back to the hospital on 12/16/2022 with 2-day onset of back pain, productive cough and shortness of breath.  He mentioned multiple falls the week prior when he had the dizzy spell.  He denies any fever or chill.  CTA of the chest abdomen and pelvis obtained on 12/17/2022 demonstrated no aortic dissection or aneurysm, mild cardiomegaly with coronary artery calcification in the LAD, faint bibasilar and lower lobe hazy and a groundglass density concerning for edema, however cannot exclude atypical pneumonia.  Patient was admitted by hospitalist service and started on antibiotic for pneumonia.  Subsequent lab work came back showing BNP greater than 4500.  His current creatinine this morning was 6.42.  He is on 80 mg daily of oral Lasix at home as he is still making urine.  Urine output after a 40 mg IV Lasix has been meager.  Nephrology service was also consulted, it was suspected that his volume overload symptom was related to remove shortened dialysis session recently.  He underwent another dialysis session today and plan to continue MWF schedule while in the hospital then transition back to TThS schedule after discharge.  Cardiology service was consulted for possible heart failure.  At the time of interview, he says his breathing is essentially back to baseline.  Echocardiogram is pending.  He mentioned the last time he used cocaine was yesterday, he admits to using cocaine on a daily basis.    Past Medical History:  Diagnosis Date   Anemia  Arthritis    Back pain    Bronchitis    Chronic kidney disease    dialysis on tues, thurs, sat   COPD (chronic obstructive pulmonary disease) (HCC)    Coronary artery disease    COVID    mild - flu like symptoms   Dyspnea    w/ exertion, uses inhaler   GERD (gastroesophageal reflux disease)    not a current problem   Heart murmur    never has  caused any problems   Hypertension    Myocardial infarction (HCC)    Pneumonia    x 1   Pre-diabetes    diet controlled, no meds, does not check blood sugar   Substance abuse (HCC)     Past Surgical History:  Procedure Laterality Date   AV FISTULA PLACEMENT Left 04/12/2021   Procedure: LEFT ARM ARTERIOVENOUS (AV) FISTULA CREATION;  Surgeon: Cephus Shelling, MD;  Location: [facility] OR;  Service: Vascular;  Laterality: Left;   AV FISTULA PLACEMENT Left 10/18/2021   Procedure: LEFT ARM BRACHIOBASILIC FISTULA CREATION FIRST STAGE;  Surgeon: Cephus Shelling, MD;  Location: [facility] OR;  Service: Vascular;  Laterality: Left;   AV FISTULA PLACEMENT Right 11/28/2022   Procedure: RIGHT ARM FIRST STAGE BRACHIOBASILIC FISTULA CREATION;  Surgeon: Cephus Shelling, MD;  Location: [facility] OR;  Service: Vascular;  Laterality: Right;   BACK SURGERY     BASCILIC VEIN TRANSPOSITION Left 01/27/2022   Procedure: LEFT SECOND STAGE BASILIC VEIN TRANSPOSITION;  Surgeon: Cephus Shelling, MD;  Location: [facility] OR;  Service: Vascular;  Laterality: Left;   BIOPSY  08/20/2022   Procedure: BIOPSY;  Surgeon: Shellia Cleverly, DO;  Location: [facility];  Service: Gastroenterology;;   COLONOSCOPY WITH PROPOFOL N/A 08/20/2022   Procedure: COLONOSCOPY WITH PROPOFOL;  Surgeon: Shellia Cleverly, DO;  Location: [facility];  Service: Gastroenterology;  Laterality: N/A;   ESOPHAGOGASTRODUODENOSCOPY (EGD) WITH PROPOFOL N/A 08/20/2022   Procedure: ESOPHAGOGASTRODUODENOSCOPY (EGD) WITH PROPOFOL;  Surgeon: Shellia Cleverly, DO;  Location: [facility];  Service: Gastroenterology;  Laterality: N/A;   INSERTION OF DIALYSIS CATHETER Right 05/01/2022   Procedure: INSERTION OF DIALYSIS CATHETER;  Surgeon: [name], MD;  Location: [facility] OR;  Service: Vascular;  Laterality: Right;   LIGATION OF COMPETING BRANCHES OF ARTERIOVENOUS FISTULA Left 07/15/2021   Procedure: LIGATION OF COMPETING BRANCHES OF LEFT RADIOCEPHALIC ARTERIOVENOUS  FISTULA TIMES TWO;  Surgeon: Cephus Shelling, MD;  Location: [facility] OR;  Service: Vascular;  Laterality: Left;   POLYPECTOMY  08/20/2022   Procedure: POLYPECTOMY;  Surgeon: Shellia Cleverly, DO;  Location: [facility];  Service: Gastroenterology;;   REVISON OF ARTERIOVENOUS FISTULA Left 07/15/2021   Procedure: REVISON OF LEFT ARTERIOVENOUS FISTULA;  Surgeon: Cephus Shelling, MD;  Location: [facility] OR;  Service: Vascular;  Laterality: Left;   REVISON OF ARTERIOVENOUS FISTULA Left 05/01/2022   Procedure: ARTERIOVENOUS FISTULA WASHOUT OF ARM HEMATOMA;  Surgeon: [name], MD;  Location: [facility] OR;  Service: Vascular;  Laterality: Left;     Home Medications:  Prior to Admission medications   Medication Sig Start Date End Date Taking? Authorizing Provider  albuterol (VENTOLIN HFA) 108 (90 Base) MCG/ACT inhaler Inhale 2 puffs into the lungs every 6 (six) hours as needed for wheezing or shortness of breath.   Yes [provider]  amLODipine (NORVASC) 10 MG tablet Take 1 tablet (10 mg total) by mouth daily. 01/01/18  Yes Lewayne Bunting, MD  aspirin EC 81 MG tablet Take 81 mg by  mouth daily. Swallow whole.   Yes [provider]  Ergocalciferol (VITAMIN D2 PO) Take by mouth.   Yes [provider]  furosemide (LASIX) 40 MG tablet Take 80 mg by mouth daily.   Yes [provider]  hydrALAZINE (APRESOLINE) 25 MG tablet Take 1 tablet (25 mg total) by mouth daily. 12/07/22  Yes Arrien, York Ram, MD  isosorbide mononitrate (IMDUR) 30 MG 24 hr tablet Take 1 tablet (30 mg total) by mouth daily. 01/01/18  Yes Lewayne Bunting, MD  labetalol (NORMODYNE) 100 MG tablet Take 2 tablets (200 mg total) by mouth every morning. Patient taking differently: Take 100 mg by mouth daily. 01/01/18  Yes Lewayne Bunting, MD  Nutritional Supplements (ENSURE CLEAR) LIQD Take 1 Bottle by mouth daily.   Yes [provider]  omeprazole (PRILOSEC) 40 MG capsule Take 1 capsule (40  mg total) by mouth daily. Patient taking differently: Take 40 mg by mouth daily as needed (heartburn). 06/06/22  Yes Arnaldo Natal, NP  oxyCODONE-acetaminophen (PERCOCET) 5-325 MG tablet Take 1 tablet by mouth every 6 (six) hours as needed for severe pain. 11/28/22  Yes Rhyne, Ames Coupe, PA-C  tamsulosin (FLOMAX) 0.4 MG CAPS capsule Take 0.8 mg by mouth 2 (two) times daily.   Yes [provider]  Blood Pressure KIT Please check blood pressure every day, and keep a log. Patient not taking: Reported on 12/17/2022 12/07/22   Arrien, York Ram, MD    Inpatient Medications: Scheduled Meds:  acetaminophen  650 mg Oral Q6H WA   Chlorhexidine Gluconate Cloth  6 each Topical Q0600   diclofenac Sodium  2 g Topical TID AC & HS   furosemide  80 mg Oral Daily   heparin injection (subcutaneous)  5,000 Units Subcutaneous Q8H   heparin  5,000 Units Dialysis Once in dialysis   hydrALAZINE  25 mg Oral Daily   isosorbide mononitrate  30 mg Oral Daily   labetalol  100 mg Oral Daily   pantoprazole  40 mg Oral Daily   tamsulosin  0.8 mg Oral Daily   Continuous Infusions:  anticoagulant sodium citrate     [START ON 12/18/2022] ceFEPime (MAXIPIME) IV     ceFEPime (MAXIPIME) IV     Followed by   Melene Muller ON 12/23/2022] ceFEPime (MAXIPIME) IV     PRN Meds: alteplase, anticoagulant sodium citrate, heparin, iohexol, lidocaine, melatonin, methocarbamol, naLOXone (NARCAN)  injection, ondansetron (ZOFRAN) IV, oxyCODONE  Allergies:   No Known Allergies  Social History:   Social History   Socioeconomic History   Marital status: Married    Spouse name: Not on file   Number of children: Not on file   Years of education: Not on file   Highest education level: Not on file  Occupational History   Not on file  Tobacco Use   Smoking status: Every Day    Packs/day: .5    Types: Cigars, Cigarettes    Last attempt to quit: 2019    Years since quitting: 5.3    Passive exposure: Past    Smokeless tobacco: Never   Tobacco comments:    1-2 per day -  black and mild cigars  Vaping Use   Vaping Use: Never used  Substance and Sexual Activity   Alcohol use: Yes    Alcohol/week: 14.0 standard drinks of alcohol    Types: 14 Cans of beer per week    Comment: Daily one-two beers per day   Drug use: Yes    Frequency:  1.0 times per week    Types: Marijuana, Cocaine    Comment: marijuana occasional, cocaine - no longer using per pt 4/18/24once a week (11/19/22)   Sexual activity: Yes  Other Topics Concern   Not on file  Social History Narrative   Not on file   Social Determinants of Health   Financial Resource Strain: Not on file  Food Insecurity: No Food Insecurity (12/17/2022)   Hunger Vital Sign    Worried About Running Out of Food in the Last Year: Never true    Ran Out of Food in the Last Year: Never true  Transportation Needs: No Transportation Needs (12/17/2022)   PRAPARE - Administrator, Civil Service (Medical): No    Lack of Transportation (Non-Medical): No  Physical Activity: Not on file  Stress: Not on file  Social Connections: Not on file  Intimate Partner Violence: Not At Risk (12/17/2022)   Humiliation, Afraid, Rape, and Kick questionnaire    Fear of Current or Ex-Partner: No    Emotionally Abused: No    Physically Abused: No    Sexually Abused: No    Family History:    Family History  Problem Relation Age of Onset   Heart disease Mother    Heart attack Sister 70     ROS:  Please see the history of present illness.   All other ROS reviewed and negative.     Physical Exam/Data:   Vitals:   12/17/22 0327 12/17/22 0447 12/17/22 0449 12/17/22 0731  BP:  125/69  (!) 126/106  Pulse:  88  85  Resp:  (!) 26 20 (!) 22  Temp: 98.6 F (37 C) 99 F (37.2 C)  99.4 F (37.4 C)  TempSrc: Oral Oral    SpO2:    94%  Weight:  62.1 kg    Height:  5\' 10"  (1.778 m)      Intake/Output Summary (Last 24 hours) at 12/17/2022 1420 Last data filed at  12/17/2022 1300 Gross per 24 hour  Intake 632.98 ml  Output 200 ml  Net 432.98 ml      12/17/2022    4:47 AM 12/16/2022    7:22 PM 12/07/2022    1:28 AM  Last 3 Weights  Weight (lbs) 136 lb 14.5 oz 143 lb 4.8 oz 136 lb 14.5 oz  Weight (kg) 62.1 kg 65 kg 62.1 kg     Body mass index is 19.64 kg/m.  General:  Well nourished, well developed, in no acute distress HEENT: normal Neck: no JVD Vascular: No carotid bruits; Distal pulses 2+ bilaterally Cardiac:  normal S1, S2; RRR; no murmur  Lungs:  clear to auscultation bilaterally, no wheezing, rhonchi or rales  Abd: soft, nontender, no hepatomegaly  Ext: no edema Musculoskeletal:  No deformities, BUE and BLE strength normal and equal Skin: warm and dry  Neuro:  CNs 2-12 intact, no focal abnormalities noted Psych:  Normal affect   EKG:  The EKG was personally reviewed and demonstrates: Sinus rhythm with PACs, atrial enlargement. Telemetry:  Telemetry was personally reviewed and demonstrates: Sinus rhythm with PACs and PVCs.  Relevant CV Studies:  Echo 08/21/2017 LV EF: 45% -   50%  Study Conclusions   - Left ventricle: The cavity size was normal. There was moderate    concentric hypertrophy. Systolic function was mildly reduced. The    estimated ejection fraction was in the range of 45% to 50%. There    is hypokinesis of the basal-midinferolateral and inferior  myocardium. The study is not technically sufficient to allow    evaluation of LV diastolic function.  - Aortic valve: There was mild regurgitation.  - Mitral valve: There was trivial regurgitation.  - Left atrium: The atrium was mildly dilated.  - Pulmonic valve: There was trivial regurgitation.    Laboratory Data:  High Sensitivity Troponin:   Recent Labs  Lab 12/06/22 2059 12/06/22 2259  TROPONINIHS 62* 56*     Chemistry Recent Labs  Lab 12/16/22 2337 12/17/22 0553  NA 138 135  K 3.9 4.6  CL 107 97*  CO2 21* 23  GLUCOSE 111* 137*  BUN 27* 31*   CREATININE 5.38* 6.42*  CALCIUM 7.9* 9.4  MG  --  2.2  GFRNONAA 11* 9*  ANIONGAP 10 15    Recent Labs  Lab 12/16/22 2337 12/17/22 0553  PROT 6.5 6.9  ALBUMIN 2.9* 3.1*  AST 20 20  ALT 14 16  ALKPHOS 52 60  BILITOT 0.8 0.8   Lipids No results for input(s): "CHOL", "TRIG", "HDL", "LABVLDL", "LDLCALC", "CHOLHDL" in the last 168 hours.  Hematology Recent Labs  Lab 12/16/22 2337 12/17/22 0553  WBC 8.6 10.0  RBC 4.88 4.97  HGB 13.4 13.9  HCT 42.2 41.3  MCV 86.5 83.1  MCH 27.5 28.0  MCHC 31.8 33.7  RDW 17.1* 17.2*  PLT 129* 126*   Thyroid No results for input(s): "TSH", "FREET4" in the last 168 hours.  BNP Recent Labs  Lab 12/17/22 0545  BNP >4,500.0*    DDimer No results for input(s): "DDIMER" in the last 168 hours.   Radiology/Studies:  CT L-SPINE NO CHARGE  Result Date: 12/17/2022 CLINICAL DATA:  Back pain EXAM: CT LUMBAR SPINE WITHOUT CONTRAST TECHNIQUE: Multidetector CT imaging of the lumbar spine was performed without intravenous contrast administration. Multiplanar CT image reconstructions were also generated. RADIATION DOSE REDUCTION: This exam was performed according to the departmental dose-optimization program which includes automated exposure control, adjustment of the mA and/or kV according to patient size and/or use of iterative reconstruction technique. COMPARISON:  Lumbar spine radiographs 12/06/2022 FINDINGS: Segmentation: 5 lumbar type vertebrae. Alignment: First degree anterolisthesis of L3 on L4.  Unchanged Vertebrae: The vertebral body heights are well preserved. No signs of acute fracture or subluxation. Status post bilateral pedicle screw and posterior rod fixation of L3-4. L4-5 laminectomy. Paraspinal and other soft tissues: Negative. Disc levels: Solid fusion of the L3-4 disc space. Large flowing ventral osteophytes identified at the remaining lumbar spine levels with relative preservation of the disc spaces. IMPRESSION: 1. No acute fracture or  subluxation. 2. Status post posterior hardware fixation of L3-4 and laminectomy at L4-5. Marland Kitchen Electronically Signed   By: Signa Kell M.D.   On: 12/17/2022 05:19   CT Angio Chest/Abd/Pel for Dissection W and/or W/WO  Result Date: 12/17/2022 CLINICAL DATA:  Severe back pain. EXAM: CT ANGIOGRAPHY CHEST, ABDOMEN AND PELVIS TECHNIQUE: Non-contrast CT of the chest was initially obtained. Multidetector CT imaging through the chest, abdomen and pelvis was performed using the standard protocol during bolus administration of intravenous contrast. Multiplanar reconstructed images and MIPs were obtained and reviewed to evaluate the vascular anatomy. RADIATION DOSE REDUCTION: This exam was performed according to the departmental dose-optimization program which includes automated exposure control, adjustment of the mA and/or kV according to patient size and/or use of iterative reconstruction technique. CONTRAST:  OMNIPAQUE IOHEXOL 350 MG/ML SOLN COMPARISON:  Chest radiograph dated 12/16/2022 and CT abdomen pelvis dated 08/05/2022. FINDINGS: Evaluation of this exam is limited due  to respiratory motion artifact as well as streak artifact caused by spinal hardware.oh CTA CHEST FINDINGS Cardiovascular: There is mild cardiomegaly. No pericardial effusion. There is coronary vascular calcification primarily involving the LAD. Left IJ central venous line with tip at the junction of the right atrium and IVC. There is mild atherosclerotic calcification of the thoracic aorta. No aneurysmal dilatation or dissection. The origins of the great vessels of the aortic arch appear patent. No pulmonary artery embolus identified. Mediastinum/Nodes: No hilar or mediastinal adenopathy. Mild thickened appearance of the esophagus. Clinical correlation is recommended to evaluate for possibility of esophagitis. Small amount of residual fluid and ingested content noted in the esophagus. No mediastinal fluid collection. Lungs/Pleura: There is mild  paraseptal emphysema. Faint bibasilar and lower lobe hazy and ground-glass densities concerning for edema. Atypical pneumonia is not excluded. Clinical correlation is recommended. No focal consolidation, pleural effusion, or pneumothorax. Linear atelectasis/scarring at the left lung base. The central airways are patent. Musculoskeletal: Degenerative changes of the spine. No acute osseous pathology. Review of the MIP images confirms the above findings. CTA ABDOMEN AND PELVIS FINDINGS VASCULAR Aorta: Mild atherosclerotic calcification. No aneurysmal dilatation or dissection. Celiac: Patent without evidence of aneurysm, dissection, vasculitis or significant stenosis. SMA: Atherosclerotic calcification of the SMA. The SMA remains patent. Renals: There is atherosclerotic calcification of the origin of the right renal artery. The renal arteries remain patent however, with diminished flow. IMA: The IMA is patent. Inflow: Mild atherosclerotic calcification of the iliac arteries. The iliac arteries are patent. No aneurysmal dilatation or dissection. Veins: The IVC is unremarkable.  No fullness gas. Review of the MIP images confirms the above findings. NON-VASCULAR No intra-abdominal free air.  Small free fluid within the pelvis. Hepatobiliary: The liver is unremarkable. No biliary dilatation. The gallbladder is unremarkable. Pancreas: The pancreas is grossly unremarkable. Spleen: Normal in size without focal abnormality. Adrenals/Urinary Tract: Probable right adrenal thickening/hyperplasia. The left adrenal gland is poorly visualized. There is moderate bilateral renal parenchyma atrophy. Small left renal interpolar cyst. There is no hydronephrosis on either side. The urinary bladder is grossly unremarkable. Stomach/Bowel: There is sigmoid diverticulosis without active inflammatory changes. There is no bowel obstruction or active inflammation. The appendix is unremarkable as visualized. Lymphatic: No adenopathy. Reproductive:  The prostate and seminal vesicles are grossly remarkable. Other: None Musculoskeletal: Degenerative changes of the spine. Multilevel laminectomy and posterior fusion. No acute osseous pathology. Review of the MIP images confirms the above findings. IMPRESSION: 1. No aortic dissection or aneurysm. 2. Mild cardiomegaly with coronary vascular calcification primarily involving the LAD. 3. Faint bibasilar and lower lobe hazy and ground-glass densities concerning for edema. Atypical pneumonia is not excluded. 4. Sigmoid diverticulosis without active inflammatory changes. No bowel obstruction. 5. Aortic Atherosclerosis (ICD10-I70.0) and Emphysema (ICD10-J43.9). Electronically Signed   By: Elgie Collard M.D.   On: 12/17/2022 01:28   DG Hip Unilat W or Wo Pelvis 2-3 Views Left  Result Date: 12/16/2022 CLINICAL DATA:  Left hip pain. EXAM: DG HIP (WITH OR WITHOUT PELVIS) 2-3V LEFT COMPARISON:  None Available. FINDINGS: Mild bilateral hip joint space narrowing with acetabular spurring. No fracture. No erosion, evidence of a vascular necrosis or focal bone abnormality. Pubic symphysis and sacroiliac joints are congruent. Postsurgical and degenerative change in the lower lumbar spine, partially included in the field of view. IMPRESSION: Mild bilateral hip osteoarthritis. Electronically Signed   By: Narda Rutherford M.D.   On: 12/16/2022 23:42   DG Chest Port 1 View  Result Date: 12/16/2022 CLINICAL DATA:  Severe upper back pain with cough. EXAM: PORTABLE CHEST 1 VIEW COMPARISON:  Chest x-ray 12/06/2022 FINDINGS: Patient is rotated. Left-sided central venous catheter tip projects over the right atrium, unchanged. The heart is enlarged similar to the prior study. Left lung base has been excluded from the examination. Visualized lungs are clear. There is no definite pleural effusion or pneumothorax. No acute fractures are seen. IMPRESSION: Cardiomegaly. No acute pulmonary process. Left lung base has been excluded from the  examination. Electronically Signed   By: Darliss Cheney M.D.   On: 12/16/2022 22:52     Assessment and Plan:   Acute on chronic systolic heart failure  -BNP greater than 4500.  He has no lower extremity edema.  He was admitted to the hospital after dialysis session yesterday.  Due to concern that he has to attend dialysis sessions recently that may have contributed to his overall symptom, he underwent dialysis again today.  Although he still makes urine, he's response to 40 mg a Lasix was quite meager.  He is now back on home dose of 80 mg oral Lasix.  -Will follow-up on echocardiogram, however he is breathing appears to be largely improved overnight.  Will hold off on dosing him with additional IV diuretic as he gets better results with dialysis.  Daily cocaine use will continue to be a problem in the future.  Even if his EF is low, he is not a great candidate for invasive study.  Dyspnea: Being treated for both pneumonia and heart failure.  On antibiotic.  Blood culture currently pending.  Back pain: CT of the chest abdomen pelvis showed no significant trauma  Polysubstance abuse: Admits to using cocaine on daily basis.  Hypertension: Resumed on home medication including hydralazine/Imdur and labetalol  Hyperlipidemia  ESRD on hemodialysis Tuesdays Thursday Saturday: Recently underwent right arm AV fistula creation.  Currently uss a left IJ HD catheter.   Risk Assessment/Risk Scores:        New York Heart Association (NYHA) Functional Class NYHA Class III        For questions or updates, please contact [facility] Please consult [email or web address] for contact info under    Signed, Azalee Course, [place]  12/17/2022 2:20 PM

## 2022-12-17 NOTE — Plan of Care (Signed)

## 2022-12-17 NOTE — Consult Note (Signed)
[facility] Renal Consultation Note    Indication for Consultation:  Management of ESRD/hemodialysis, anemia, hypertension/volume, and secondary hyperparathyroidism.  HPI: Ronald Reeves is a 67 y.o. male with PMH including ESRD on dialysis, chronic back pain, COPD, HTN, current cocaine use, and prior MI who presented to the ED with primary complaint of back pain. Nephrology has been consulted for management of ESRD. Wife is at bedside and assists with history. She reports he has chronic back pain but it has been progressively worse over the past week to the point that he is unable to sit through an entire HD session. He is also having trouble ambulating. Pt also reports he has had cough, shortness of breath, and nausea for the past several days and vomited several times yesterday. Shortness of breath is worse with exertion. Denies chest pain, palpitations, dizziness, headaches, blurry vision. Denies fever and chills. Still makes some urine and denies dysuria, hematuria, foul smelling urine, and flank pain.   Labs notable for K+ 3.9, BUN 27, Cr 5.38, Ca 7.9, Alb 2.9, WBC 10.0, Hgb 13.9, Plt 126. Chest CT showed bibasilar and lower lobe opacities consistent with pulmonary edema and/or atypical infection. CT spine without any acute changes.   Past Medical History:  Diagnosis Date   Anemia    Arthritis    Back pain    Bronchitis    Chronic kidney disease    dialysis on tues, thurs, sat   COPD (chronic obstructive pulmonary disease) (HCC)    Coronary artery disease    COVID    mild - flu like symptoms   Dyspnea    w/ exertion, uses inhaler   GERD (gastroesophageal reflux disease)    not a current problem   Heart murmur    never has caused any problems   Hypertension    Myocardial infarction (HCC)    Pneumonia    x 1   Pre-diabetes    diet controlled, no meds, does not check blood sugar   Substance abuse (HCC)    Past Surgical History:  Procedure Laterality Date   AV  FISTULA PLACEMENT Left 04/12/2021   Procedure: LEFT ARM ARTERIOVENOUS (AV) FISTULA CREATION;  Surgeon: Cephus Shelling, MD;  Location: [facility] OR;  Service: Vascular;  Laterality: Left;   AV FISTULA PLACEMENT Left 10/18/2021   Procedure: LEFT ARM BRACHIOBASILIC FISTULA CREATION FIRST STAGE;  Surgeon: Cephus Shelling, MD;  Location: [facility] OR;  Service: Vascular;  Laterality: Left;   AV FISTULA PLACEMENT Right 11/28/2022   Procedure: RIGHT ARM FIRST STAGE BRACHIOBASILIC FISTULA CREATION;  Surgeon: Cephus Shelling, MD;  Location: [facility] OR;  Service: Vascular;  Laterality: Right;   BACK SURGERY     BASCILIC VEIN TRANSPOSITION Left 01/27/2022   Procedure: LEFT SECOND STAGE BASILIC VEIN TRANSPOSITION;  Surgeon: Cephus Shelling, MD;  Location: [facility] OR;  Service: Vascular;  Laterality: Left;   BIOPSY  08/20/2022   Procedure: BIOPSY;  Surgeon: Shellia Cleverly, DO;  Location: [facility];  Service: Gastroenterology;;   COLONOSCOPY WITH PROPOFOL N/A 08/20/2022   Procedure: COLONOSCOPY WITH PROPOFOL;  Surgeon: Shellia Cleverly, DO;  Location: [facility];  Service: Gastroenterology;  Laterality: N/A;   ESOPHAGOGASTRODUODENOSCOPY (EGD) WITH PROPOFOL N/A 08/20/2022   Procedure: ESOPHAGOGASTRODUODENOSCOPY (EGD) WITH PROPOFOL;  Surgeon: Shellia Cleverly, DO;  Location: [facility];  Service: Gastroenterology;  Laterality: N/A;   INSERTION OF DIALYSIS CATHETER Right 05/01/2022   Procedure: INSERTION OF DIALYSIS CATHETER;  Surgeon: [name], MD;  Location: [facility] OR;  Service: Vascular;  Laterality: Right;   LIGATION OF COMPETING BRANCHES OF ARTERIOVENOUS FISTULA Left 07/15/2021   Procedure: LIGATION OF COMPETING BRANCHES OF LEFT RADIOCEPHALIC ARTERIOVENOUS FISTULA TIMES TWO;  Surgeon: Cephus Shelling, MD;  Location[facility] OR;  Service: Vascular;  Laterality: Left;   POLYPECTOMY  08/20/2022   Procedure: POLYPECTOMY;  Surgeon: Shellia Cleverly, DO;  Location: [facility];  Service: Gastroenterology;;    REVISON OF ARTERIOVENOUS FISTULA Left 07/15/2021   Procedure: REVISON OF LEFT ARTERIOVENOUS FISTULA;  Surgeon: Cephus Shelling, MD;  Location: [facility] OR;  Service: Vascular;  Laterality: Left;   REVISON OF ARTERIOVENOUS FISTULA Left 05/01/2022   Procedure: ARTERIOVENOUS FISTULA WASHOUT OF ARM HEMATOMA;  Surgeon: [name], MD;  Location: [facility] OR;  Service: Vascular;  Laterality: Left;   Family History  Problem Relation Age of Onset   Heart disease Mother    Heart attack Sister 54   Social History:  reports that he has been smoking cigars and cigarettes. He has been smoking an average of .5 packs per day. He has been exposed to tobacco smoke. He has never used smokeless tobacco. He reports current alcohol use of about 14.0 standard drinks of alcohol per week. He reports current drug use. Frequency: 1.00 time per week. Drugs: Marijuana and Cocaine.  ROS: As per HPI otherwise negative.   Physical Exam: Vitals:   12/17/22 0100 12/17/22 0327 12/17/22 0447 12/17/22 0449  BP: (!) 152/82  125/69   Pulse: 91  88   Resp: (!) 24  (!) 26 20  Temp:  98.6 F (37 C) 99 F (37.2 C)   TempSrc:  Oral Oral   SpO2: 97%     Weight:   62.1 kg   Height:   5\' 10"  (1.778 m)      General: Elderly appearing male, alert and in NAD Head: Normocephalic, atraumatic, sclera non-icteric, mucus membranes are moist. Neck: JVD not elevated. Lungs: Respirations unlabored on RA, scattered rhonchi bilateral bases Heart: RRR, 2/6 systolic murmur. Abdomen: Soft, non-tender, non-distended with normoactive bowel sounds.  Musculoskeletal:  Strength and tone appear normal for age. Lower extremities: No edema bilateral lower extremities Neuro: Alert and oriented X 3. Moves all extremities spontaneously. Psych:  Responds to questions appropriately with a normal affect. Dialysis Access: TDC, new RUE AVF + thrill/bruit  No Known Allergies Prior to Admission medications   Medication Sig Start Date End Date Taking?  Authorizing Provider  albuterol (VENTOLIN HFA) 108 (90 Base) MCG/ACT inhaler Inhale 2 puffs into the lungs every 6 (six) hours as needed for wheezing or shortness of breath.    [provider]  amLODipine (NORVASC) 10 MG tablet Take 1 tablet (10 mg total) by mouth daily. 01/01/18   Lewayne Bunting, MD  Blood Pressure KIT Please check blood pressure every day, and keep a log. 12/07/22   Arrien, York Ram, MD  Ergocalciferol (VITAMIN D2 PO) Take by mouth.    [provider]  furosemide (LASIX) 40 MG tablet Take 80 mg by mouth daily.    [provider]  hydrALAZINE (APRESOLINE) 25 MG tablet Take 1 tablet (25 mg total) by mouth daily. 12/07/22   Arrien, York Ram, MD  isosorbide mononitrate (IMDUR) 30 MG 24 hr tablet Take 1 tablet (30 mg total) by mouth daily. 01/01/18   Lewayne Bunting, MD  labetalol (NORMODYNE) 100 MG tablet Take 2 tablets (200 mg total) by mouth every morning. Patient taking differently: Take 100 mg by mouth daily. 01/01/18  Lewayne Bunting, MD  Nutritional Supplements (ENSURE CLEAR) LIQD Take 1 Bottle by mouth daily.    [provider]  omeprazole (PRILOSEC) 40 MG capsule Take 1 capsule (40 mg total) by mouth daily. Patient taking differently: Take 40 mg by mouth daily as needed (heartburn). 06/06/22   Arnaldo Natal, NP  oxyCODONE-acetaminophen (PERCOCET) 5-325 MG tablet Take 1 tablet by mouth every 6 (six) hours as needed for severe pain. 11/28/22   Rhyne, Ames Coupe, PA-C  tamsulosin (FLOMAX) 0.4 MG CAPS capsule Take 0.8 mg by mouth in the morning.    [provider]   Current Facility-Administered Medications  Medication Dose Route Frequency Provider Last Rate Last Admin   acetaminophen (TYLENOL) tablet 650 mg  650 mg Oral Q6H PRN Howerter, Justin B, DO       Or   acetaminophen (TYLENOL) suppository 650 mg  650 mg Rectal Q6H PRN Howerter, Justin B, DO       [START ON 12/18/2022] ceFEPIme (MAXIPIME) 2 g in  sodium chloride 0.9 % 100 mL IVPB  2 g Intravenous Q T,Th,Sa-HD Phylliss Blakes, RPH       fentaNYL (SUBLIMAZE) injection 25 mcg  25 mcg Intravenous Q2H PRN Howerter, Justin B, DO   25 mcg at 12/17/22 0406   heparin injection 5,000 Units  5,000 Units Subcutaneous Q8H Hall, Carole N, DO       iohexol (OMNIPAQUE) 300 MG/ML solution 100 mL  100 mL Intravenous Once PRN Rhunette Croft, Ankit, MD       melatonin tablet 3 mg  3 mg Oral QHS PRN Howerter, Justin B, DO       naloxone (NARCAN) injection 0.4 mg  0.4 mg Intravenous PRN Howerter, Justin B, DO       ondansetron (ZOFRAN) injection 4 mg  4 mg Intravenous Q6H PRN Howerter, Justin B, DO       pantoprazole (PROTONIX) EC tablet 40 mg  40 mg Oral Daily Hall, Carole N, DO       tamsulosin (FLOMAX) capsule 0.8 mg  0.8 mg Oral Daily Dow Adolph N, DO       [START ON 12/18/2022] vancomycin (VANCOREADY) IVPB 750 mg/150 mL  750 mg Intravenous Q T,Th,Sa-HD Phylliss Blakes, [facility]       Labs: Basic Metabolic Panel: Recent Labs  Lab 12/16/22 2337  NA 138  K 3.9  CL 107  CO2 21*  GLUCOSE 111*  BUN 27*  CREATININE 5.38*  CALCIUM 7.9*   Liver Function Tests: Recent Labs  Lab 12/16/22 2337  AST 20  ALT 14  ALKPHOS 52  BILITOT 0.8  PROT 6.5  ALBUMIN 2.9*   No results for input(s): "LIPASE", "AMYLASE" in the last 168 hours. No results for input(s): "AMMONIA" in the last 168 hours. CBC: Recent Labs  Lab 12/16/22 2337  WBC 8.6  NEUTROABS 6.1  HGB 13.4  HCT 42.2  MCV 86.5  PLT 129*   Cardiac Enzymes: No results for input(s): "CKTOTAL", "CKMB", "CKMBINDEX", "TROPONINI" in the last 168 hours. CBG: No results for input(s): "GLUCAP" in the last 168 hours. Iron Studies: No results for input(s): "IRON", "TIBC", "TRANSFERRIN", "FERRITIN" in the last 72 hours. Studies/Results: CT L-SPINE NO CHARGE  Result Date: 12/17/2022 CLINICAL DATA:  Back pain EXAM: CT LUMBAR SPINE WITHOUT CONTRAST TECHNIQUE: Multidetector CT imaging of the lumbar spine  was performed without intravenous contrast administration. Multiplanar CT image reconstructions were also generated. RADIATION DOSE REDUCTION: This exam was performed according to the departmental dose-optimization program which  includes automated exposure control, adjustment of the mA and/or kV according to patient size and/or use of iterative reconstruction technique. COMPARISON:  Lumbar spine radiographs 12/06/2022 FINDINGS: Segmentation: 5 lumbar type vertebrae. Alignment: First degree anterolisthesis of L3 on L4.  Unchanged Vertebrae: The vertebral body heights are well preserved. No signs of acute fracture or subluxation. Status post bilateral pedicle screw and posterior rod fixation of L3-4. L4-5 laminectomy. Paraspinal and other soft tissues: Negative. Disc levels: Solid fusion of the L3-4 disc space. Large flowing ventral osteophytes identified at the remaining lumbar spine levels with relative preservation of the disc spaces. IMPRESSION: 1. No acute fracture or subluxation. 2. Status post posterior hardware fixation of L3-4 and laminectomy at L4-5. Marland Kitchen Electronically Signed   By: Signa Kell M.D.   On: 12/17/2022 05:19   CT Angio Chest/Abd/Pel for Dissection W and/or W/WO  Result Date: 12/17/2022 CLINICAL DATA:  Severe back pain. EXAM: CT ANGIOGRAPHY CHEST, ABDOMEN AND PELVIS TECHNIQUE: Non-contrast CT of the chest was initially obtained. Multidetector CT imaging through the chest, abdomen and pelvis was performed using the standard protocol during bolus administration of intravenous contrast. Multiplanar reconstructed images and MIPs were obtained and reviewed to evaluate the vascular anatomy. RADIATION DOSE REDUCTION: This exam was performed according to the departmental dose-optimization program which includes automated exposure control, adjustment of the mA and/or kV according to patient size and/or use of iterative reconstruction technique. CONTRAST:  OMNIPAQUE IOHEXOL 350 MG/ML SOLN  COMPARISON:  Chest radiograph dated 12/16/2022 and CT abdomen pelvis dated 08/05/2022. FINDINGS: Evaluation of this exam is limited due to respiratory motion artifact as well as streak artifact caused by spinal hardware.oh CTA CHEST FINDINGS Cardiovascular: There is mild cardiomegaly. No pericardial effusion. There is coronary vascular calcification primarily involving the LAD. Left IJ central venous line with tip at the junction of the right atrium and IVC. There is mild atherosclerotic calcification of the thoracic aorta. No aneurysmal dilatation or dissection. The origins of the great vessels of the aortic arch appear patent. No pulmonary artery embolus identified. Mediastinum/Nodes: No hilar or mediastinal adenopathy. Mild thickened appearance of the esophagus. Clinical correlation is recommended to evaluate for possibility of esophagitis. Small amount of residual fluid and ingested content noted in the esophagus. No mediastinal fluid collection. Lungs/Pleura: There is mild paraseptal emphysema. Faint bibasilar and lower lobe hazy and ground-glass densities concerning for edema. Atypical pneumonia is not excluded. Clinical correlation is recommended. No focal consolidation, pleural effusion, or pneumothorax. Linear atelectasis/scarring at the left lung base. The central airways are patent. Musculoskeletal: Degenerative changes of the spine. No acute osseous pathology. Review of the MIP images confirms the above findings. CTA ABDOMEN AND PELVIS FINDINGS VASCULAR Aorta: Mild atherosclerotic calcification. No aneurysmal dilatation or dissection. Celiac: Patent without evidence of aneurysm, dissection, vasculitis or significant stenosis. SMA: Atherosclerotic calcification of the SMA. The SMA remains patent. Renals: There is atherosclerotic calcification of the origin of the right renal artery. The renal arteries remain patent however, with diminished flow. IMA: The IMA is patent. Inflow: Mild atherosclerotic  calcification of the iliac arteries. The iliac arteries are patent. No aneurysmal dilatation or dissection. Veins: The IVC is unremarkable.  No fullness gas. Review of the MIP images confirms the above findings. NON-VASCULAR No intra-abdominal free air.  Small free fluid within the pelvis. Hepatobiliary: The liver is unremarkable. No biliary dilatation. The gallbladder is unremarkable. Pancreas: The pancreas is grossly unremarkable. Spleen: Normal in size without focal abnormality. Adrenals/Urinary Tract: Probable right adrenal thickening/hyperplasia. The left adrenal  gland is poorly visualized. There is moderate bilateral renal parenchyma atrophy. Small left renal interpolar cyst. There is no hydronephrosis on either side. The urinary bladder is grossly unremarkable. Stomach/Bowel: There is sigmoid diverticulosis without active inflammatory changes. There is no bowel obstruction or active inflammation. The appendix is unremarkable as visualized. Lymphatic: No adenopathy. Reproductive: The prostate and seminal vesicles are grossly remarkable. Other: None Musculoskeletal: Degenerative changes of the spine. Multilevel laminectomy and posterior fusion. No acute osseous pathology. Review of the MIP images confirms the above findings. IMPRESSION: 1. No aortic dissection or aneurysm. 2. Mild cardiomegaly with coronary vascular calcification primarily involving the LAD. 3. Faint bibasilar and lower lobe hazy and ground-glass densities concerning for edema. Atypical pneumonia is not excluded. 4. Sigmoid diverticulosis without active inflammatory changes. No bowel obstruction. 5. Aortic Atherosclerosis (ICD10-I70.0) and Emphysema (ICD10-J43.9). Electronically Signed   By: Elgie Collard M.D.   On: 12/17/2022 01:28   DG Hip Unilat W or Wo Pelvis 2-3 Views Left  Result Date: 12/16/2022 CLINICAL DATA:  Left hip pain. EXAM: DG HIP (WITH OR WITHOUT PELVIS) 2-3V LEFT COMPARISON:  None Available. FINDINGS: Mild bilateral hip  joint space narrowing with acetabular spurring. No fracture. No erosion, evidence of a vascular necrosis or focal bone abnormality. Pubic symphysis and sacroiliac joints are congruent. Postsurgical and degenerative change in the lower lumbar spine, partially included in the field of view. IMPRESSION: Mild bilateral hip osteoarthritis. Electronically Signed   By: Narda Rutherford M.D.   On: 12/16/2022 23:42   DG Chest Port 1 View  Result Date: 12/16/2022 CLINICAL DATA:  Severe upper back pain with cough. EXAM: PORTABLE CHEST 1 VIEW COMPARISON:  Chest x-ray 12/06/2022 FINDINGS: Patient is rotated. Left-sided central venous catheter tip projects over the right atrium, unchanged. The heart is enlarged similar to the prior study. Left lung base has been excluded from the examination. Visualized lungs are clear. There is no definite pleural effusion or pneumothorax. No acute fractures are seen. IMPRESSION: Cardiomegaly. No acute pulmonary process. Left lung base has been excluded from the examination. Electronically Signed   By: Darliss Cheney M.D.   On: 12/16/2022 22:52    Dialysis Orders: Center: [facility]  on TTS. 180NRe 4 hours BFR 350 DFR A1.5 EDW 64kg 2K 3Ca  Heparin 5000 unit bolus pre-HD and 2500 unit bolus mid HD Left 0.6kg above EDW, past 3 treatments were shortened  Calcitriol PO q HD  Assessment/Plan:  Acute on chronic CHF: CXR with possible pneumonia via CHF. Has been shortening HD due to back pain. Will plan for extra dialysis today, UF goal 2.5L as tolerated. HCAP: started on broad spectrum antibiotics, blood cultures pending Back pain: Acute on chronic, management per admitting team  ESRD:  On TTS schedule but has been shortening treatments, see above. Extra HD today then resume MWF schedule.   Hypertension/volume: BP controlled, volume overloaded as above. On several BP meds outpatient, resume PRN  Anemia: Hgb 13.9, no ESA indicated.   Metabolic bone disease: Corrected calcium at goal.  Continue VDRA and phos binder (recently switched to [place])  Dialysis access: Using AVF, had right first stage basilic vein transposition on 11/28/22. Has not been used again yet.  Rogers Blocker, PA-C 12/17/2022, 6:43 AM  [facility] Pager: [phone]

## 2022-12-17 NOTE — ED Provider Notes (Signed)
Case d/w Dr. Malen Gauze will be seen by nephrology    Scheryl Sanborn, MD 12/17/22 [phone]

## 2022-12-18 ENCOUNTER — Inpatient Hospital Stay (HOSPITAL_COMMUNITY)
Admission: EM | Admit: 2022-12-18 | Discharge: 2022-12-20 | DRG: 314 | Discharge status: Against Medical Advice | Payer: No Typology Code available for payment source | Attending: Internal Medicine | Admitting: Internal Medicine

## 2022-12-18 ENCOUNTER — Inpatient Hospital Stay (HOSPITAL_COMMUNITY): Payer: No Typology Code available for payment source

## 2022-12-18 ENCOUNTER — Other Ambulatory Visit: Payer: Self-pay

## 2022-12-18 ENCOUNTER — Encounter (HOSPITAL_COMMUNITY): Payer: Self-pay | Admitting: Student

## 2022-12-18 DIAGNOSIS — F1420 Cocaine dependence, uncomplicated: Secondary | ICD-10-CM | POA: Diagnosis present

## 2022-12-18 DIAGNOSIS — I2510 Atherosclerotic heart disease of native coronary artery without angina pectoris: Secondary | ICD-10-CM | POA: Diagnosis present

## 2022-12-18 DIAGNOSIS — B952 Enterococcus as the cause of diseases classified elsewhere: Secondary | ICD-10-CM | POA: Diagnosis present

## 2022-12-18 DIAGNOSIS — Z8616 Personal history of COVID-19: Secondary | ICD-10-CM | POA: Diagnosis not present

## 2022-12-18 DIAGNOSIS — Z681 Body mass index (BMI) 19 or less, adult: Secondary | ICD-10-CM

## 2022-12-18 DIAGNOSIS — R7303 Prediabetes: Secondary | ICD-10-CM | POA: Diagnosis present

## 2022-12-18 DIAGNOSIS — Z8249 Family history of ischemic heart disease and other diseases of the circulatory system: Secondary | ICD-10-CM

## 2022-12-18 DIAGNOSIS — Z5329 Procedure and treatment not carried out because of patient's decision for other reasons: Secondary | ICD-10-CM | POA: Diagnosis present

## 2022-12-18 DIAGNOSIS — G8929 Other chronic pain: Secondary | ICD-10-CM | POA: Diagnosis present

## 2022-12-18 DIAGNOSIS — M549 Dorsalgia, unspecified: Secondary | ICD-10-CM | POA: Diagnosis present

## 2022-12-18 DIAGNOSIS — I132 Hypertensive heart and chronic kidney disease with heart failure and with stage 5 chronic kidney disease, or end stage renal disease: Secondary | ICD-10-CM | POA: Diagnosis present

## 2022-12-18 DIAGNOSIS — Z992 Dependence on renal dialysis: Secondary | ICD-10-CM | POA: Diagnosis not present

## 2022-12-18 DIAGNOSIS — F17290 Nicotine dependence, other tobacco product, uncomplicated: Secondary | ICD-10-CM | POA: Diagnosis present

## 2022-12-18 DIAGNOSIS — N186 End stage renal disease: Secondary | ICD-10-CM | POA: Diagnosis present

## 2022-12-18 DIAGNOSIS — Y848 Other medical procedures as the cause of abnormal reaction of the patient, or of later complication, without mention of misadventure at the time of the procedure: Secondary | ICD-10-CM | POA: Diagnosis present

## 2022-12-18 DIAGNOSIS — N400 Enlarged prostate without lower urinary tract symptoms: Secondary | ICD-10-CM | POA: Diagnosis not present

## 2022-12-18 DIAGNOSIS — R7881 Bacteremia: Secondary | ICD-10-CM | POA: Diagnosis present

## 2022-12-18 DIAGNOSIS — I5022 Chronic systolic (congestive) heart failure: Secondary | ICD-10-CM | POA: Diagnosis present

## 2022-12-18 DIAGNOSIS — R5381 Other malaise: Secondary | ICD-10-CM | POA: Diagnosis present

## 2022-12-18 DIAGNOSIS — I252 Old myocardial infarction: Secondary | ICD-10-CM | POA: Diagnosis not present

## 2022-12-18 DIAGNOSIS — I10 Essential (primary) hypertension: Secondary | ICD-10-CM | POA: Diagnosis not present

## 2022-12-18 DIAGNOSIS — M545 Low back pain, unspecified: Secondary | ICD-10-CM

## 2022-12-18 DIAGNOSIS — J449 Chronic obstructive pulmonary disease, unspecified: Secondary | ICD-10-CM | POA: Diagnosis present

## 2022-12-18 DIAGNOSIS — R296 Repeated falls: Secondary | ICD-10-CM | POA: Diagnosis present

## 2022-12-18 DIAGNOSIS — D696 Thrombocytopenia, unspecified: Secondary | ICD-10-CM | POA: Diagnosis present

## 2022-12-18 DIAGNOSIS — T80211A Bloodstream infection due to central venous catheter, initial encounter: Principal | ICD-10-CM | POA: Diagnosis present

## 2022-12-18 DIAGNOSIS — Z79899 Other long term (current) drug therapy: Secondary | ICD-10-CM

## 2022-12-18 DIAGNOSIS — I5023 Acute on chronic systolic (congestive) heart failure: Secondary | ICD-10-CM | POA: Diagnosis not present

## 2022-12-18 DIAGNOSIS — I491 Atrial premature depolarization: Secondary | ICD-10-CM | POA: Diagnosis not present

## 2022-12-18 DIAGNOSIS — D631 Anemia in chronic kidney disease: Secondary | ICD-10-CM | POA: Diagnosis present

## 2022-12-18 DIAGNOSIS — R636 Underweight: Secondary | ICD-10-CM | POA: Diagnosis present

## 2022-12-18 DIAGNOSIS — Z7982 Long term (current) use of aspirin: Secondary | ICD-10-CM

## 2022-12-18 DIAGNOSIS — K219 Gastro-esophageal reflux disease without esophagitis: Secondary | ICD-10-CM | POA: Diagnosis present

## 2022-12-18 DIAGNOSIS — M898X9 Other specified disorders of bone, unspecified site: Secondary | ICD-10-CM | POA: Diagnosis present

## 2022-12-18 DIAGNOSIS — I959 Hypotension, unspecified: Secondary | ICD-10-CM | POA: Diagnosis not present

## 2022-12-18 LAB — CBC WITH DIFFERENTIAL/PLATELET
Abs Immature Granulocytes: 0.03 10*3/uL (ref 0.00–0.07)
Basophils Absolute: 0.1 10*3/uL (ref 0.0–0.1)
Basophils Relative: 1 %
Eosinophils Absolute: 0.5 10*3/uL (ref 0.0–0.5)
Eosinophils Relative: 6 %
HCT: 41.3 % (ref 39.0–52.0)
Hemoglobin: 13.6 g/dL (ref 13.0–17.0)
Immature Granulocytes: 0 %
Lymphocytes Relative: 12 %
Lymphs Abs: 1 10*3/uL (ref 0.7–4.0)
MCH: 27.9 pg (ref 26.0–34.0)
MCHC: 32.9 g/dL (ref 30.0–36.0)
MCV: 84.6 fL (ref 80.0–100.0)
Monocytes Absolute: 1.2 10*3/uL — ABNORMAL HIGH (ref 0.1–1.0)
Monocytes Relative: 14 %
Neutro Abs: 5.8 10*3/uL (ref 1.7–7.7)
Neutrophils Relative %: 67 %
Platelets: 121 10*3/uL — ABNORMAL LOW (ref 150–400)
RBC: 4.88 MIL/uL (ref 4.22–5.81)
RDW: 17.1 % — ABNORMAL HIGH (ref 11.5–15.5)
WBC: 8.6 10*3/uL (ref 4.0–10.5)
nRBC: 0 % (ref 0.0–0.2)

## 2022-12-18 LAB — URINALYSIS, W/ REFLEX TO CULTURE (INFECTION SUSPECTED)
Bilirubin Urine: NEGATIVE
Glucose, UA: NEGATIVE mg/dL
Ketones, ur: NEGATIVE mg/dL
Nitrite: NEGATIVE
Protein, ur: 100 mg/dL — AB
Specific Gravity, Urine: 1.023 (ref 1.005–1.030)
pH: 5 (ref 5.0–8.0)

## 2022-12-18 LAB — CBC
HCT: 40.5 % (ref 39.0–52.0)
Hemoglobin: 13.4 g/dL (ref 13.0–17.0)
MCH: 27.5 pg (ref 26.0–34.0)
MCHC: 33.1 g/dL (ref 30.0–36.0)
MCV: 83 fL (ref 80.0–100.0)
Platelets: 119 10*3/uL — ABNORMAL LOW (ref 150–400)
RBC: 4.88 MIL/uL (ref 4.22–5.81)
RDW: 16.9 % — ABNORMAL HIGH (ref 11.5–15.5)
WBC: 7.7 10*3/uL (ref 4.0–10.5)
nRBC: 0 % (ref 0.0–0.2)

## 2022-12-18 LAB — PROCALCITONIN: Procalcitonin: 1.39 ng/mL

## 2022-12-18 LAB — RENAL FUNCTION PANEL
Albumin: 2.7 g/dL — ABNORMAL LOW (ref 3.5–5.0)
Anion gap: 13 (ref 5–15)
BUN: 38 mg/dL — ABNORMAL HIGH (ref 8–23)
CO2: 24 mmol/L (ref 22–32)
Calcium: 8.7 mg/dL — ABNORMAL LOW (ref 8.9–10.3)
Chloride: 96 mmol/L — ABNORMAL LOW (ref 98–111)
Creatinine, Ser: 6.31 mg/dL — ABNORMAL HIGH (ref 0.61–1.24)
GFR, Estimated: 9 mL/min — ABNORMAL LOW (ref >60)
Glucose, Bld: 107 mg/dL — ABNORMAL HIGH (ref 70–99)
Phosphorus: 4.8 mg/dL — ABNORMAL HIGH (ref 2.5–4.6)
Potassium: 3.9 mmol/L (ref 3.5–5.1)
Sodium: 133 mmol/L — ABNORMAL LOW (ref 135–145)

## 2022-12-18 LAB — RAPID URINE DRUG SCREEN, HOSP PERFORMED
Amphetamines: NOT DETECTED
Barbiturates: NOT DETECTED
Benzodiazepines: NOT DETECTED
Cocaine: POSITIVE — AB
Opiates: NOT DETECTED
Tetrahydrocannabinol: NOT DETECTED

## 2022-12-18 LAB — COMPREHENSIVE METABOLIC PANEL
ALT: 12 U/L (ref 0–44)
AST: 16 U/L (ref 15–41)
Albumin: 2.9 g/dL — ABNORMAL LOW (ref 3.5–5.0)
Alkaline Phosphatase: 54 U/L (ref 38–126)
Anion gap: 14 (ref 5–15)
BUN: 41 mg/dL — ABNORMAL HIGH (ref 8–23)
CO2: 25 mmol/L (ref 22–32)
Calcium: 8.8 mg/dL — ABNORMAL LOW (ref 8.9–10.3)
Chloride: 94 mmol/L — ABNORMAL LOW (ref 98–111)
Creatinine, Ser: 6.68 mg/dL — ABNORMAL HIGH (ref 0.61–1.24)
GFR, Estimated: 9 mL/min — ABNORMAL LOW (ref >60)
Glucose, Bld: 107 mg/dL — ABNORMAL HIGH (ref 70–99)
Potassium: 3.7 mmol/L (ref 3.5–5.1)
Sodium: 133 mmol/L — ABNORMAL LOW (ref 135–145)
Total Bilirubin: 0.5 mg/dL (ref 0.3–1.2)
Total Protein: 6.9 g/dL (ref 6.5–8.1)

## 2022-12-18 LAB — CULTURE, BLOOD (ROUTINE X 2): Culture: NO GROWTH

## 2022-12-18 LAB — HEPATITIS A ANTIBODY, TOTAL: hep A Total Ab: REACTIVE — AB

## 2022-12-18 LAB — HEPATITIS C ANTIBODY: HCV Ab: NONREACTIVE

## 2022-12-18 MED ORDER — HEPARIN SODIUM (PORCINE) 1000 UNIT/ML DIALYSIS: 1000.0000 [IU] | INTRAMUSCULAR | Status: DC | PRN

## 2022-12-18 MED ORDER — PANTOPRAZOLE SODIUM 40 MG PO TBEC
40.0000 mg | DELAYED_RELEASE_TABLET | Freq: Every day | ORAL | Status: DC
  Administered 2022-12-19: 40 mg via ORAL
  Filled 2022-12-18: qty 1

## 2022-12-18 MED ORDER — ALTEPLASE 2 MG IJ SOLR: 2.0000 mg | Freq: Once | INTRAMUSCULAR | Status: DC | PRN

## 2022-12-18 MED ORDER — ACETAMINOPHEN 325 MG PO TABS: 650.0000 mg | ORAL_TABLET | Freq: Four times a day (QID) | ORAL | Status: DC | PRN

## 2022-12-18 MED ORDER — HEPARIN SODIUM (PORCINE) 1000 UNIT/ML DIALYSIS
5000.0000 [IU] | Freq: Once | INTRAMUSCULAR | Status: AC
  Administered 2022-12-18: 5000 [IU] via INTRAVENOUS_CENTRAL
  Filled 2022-12-18: qty 5

## 2022-12-18 MED ORDER — HEPARIN SODIUM (PORCINE) 5000 UNIT/ML IJ SOLN
5000.0000 [IU] | Freq: Three times a day (TID) | INTRAMUSCULAR | Status: DC
  Administered 2022-12-19 – 2022-12-20 (×3): 5000 [IU] via SUBCUTANEOUS
  Filled 2022-12-18 (×4): qty 1

## 2022-12-18 MED ORDER — OXYCODONE HCL 5 MG PO TABS
5.0000 mg | ORAL_TABLET | Freq: Three times a day (TID) | ORAL | Status: DC | PRN
  Administered 2022-12-18 – 2022-12-19 (×3): 5 mg via ORAL
  Filled 2022-12-18 (×3): qty 1

## 2022-12-18 MED ORDER — ISOSORBIDE MONONITRATE ER 30 MG PO TB24
30.0000 mg | ORAL_TABLET | Freq: Every day | ORAL | Status: DC
  Administered 2022-12-19: 30 mg via ORAL
  Filled 2022-12-18 (×2): qty 1

## 2022-12-18 MED ORDER — LABETALOL HCL 200 MG PO TABS
100.0000 mg | ORAL_TABLET | Freq: Two times a day (BID) | ORAL | Status: DC
  Administered 2022-12-18 – 2022-12-19 (×3): 100 mg via ORAL
  Filled 2022-12-18 (×4): qty 1

## 2022-12-18 MED ORDER — CHLORHEXIDINE GLUCONATE CLOTH 2 % EX PADS: 6.0000 | MEDICATED_PAD | Freq: Every day | CUTANEOUS | Status: DC

## 2022-12-18 MED ORDER — HEPARIN SODIUM (PORCINE) 1000 UNIT/ML DIALYSIS
1000.0000 [IU] | INTRAMUSCULAR | Status: DC | PRN
  Administered 2022-12-18: 1000 [IU] via INTRAVENOUS_CENTRAL
  Filled 2022-12-18: qty 1

## 2022-12-18 MED ORDER — SENNOSIDES-DOCUSATE SODIUM 8.6-50 MG PO TABS: 1.0000 | ORAL_TABLET | Freq: Two times a day (BID) | ORAL | Status: DC | PRN

## 2022-12-18 MED ORDER — ONDANSETRON HCL 4 MG PO TABS: 4.0000 mg | ORAL_TABLET | Freq: Four times a day (QID) | ORAL | Status: DC | PRN

## 2022-12-18 MED ORDER — GENTAMICIN SULFATE 40 MG/ML IJ SOLN
70.0000 mg | INTRAVENOUS | Status: DC
  Administered 2022-12-18: 70 mg via INTRAVENOUS
  Filled 2022-12-18 (×2): qty 1.75

## 2022-12-18 MED ORDER — ONDANSETRON HCL 4 MG/2ML IJ SOLN: 4.0000 mg | Freq: Four times a day (QID) | INTRAMUSCULAR | Status: DC | PRN

## 2022-12-18 MED ORDER — ASPIRIN 81 MG PO TBEC
81.0000 mg | DELAYED_RELEASE_TABLET | Freq: Every day | ORAL | Status: DC
  Administered 2022-12-19: 81 mg via ORAL
  Filled 2022-12-18: qty 1

## 2022-12-18 MED ORDER — TAMSULOSIN HCL 0.4 MG PO CAPS
0.4000 mg | ORAL_CAPSULE | Freq: Two times a day (BID) | ORAL | Status: DC
  Administered 2022-12-18 – 2022-12-19 (×3): 0.4 mg via ORAL
  Filled 2022-12-18 (×4): qty 1

## 2022-12-18 MED ORDER — FENTANYL CITRATE PF 50 MCG/ML IJ SOSY
25.0000 ug | PREFILLED_SYRINGE | INTRAMUSCULAR | Status: DC | PRN
  Administered 2022-12-18 – 2022-12-19 (×4): 25 ug via INTRAVENOUS
  Filled 2022-12-18 (×4): qty 1

## 2022-12-18 MED ORDER — RENA-VITE PO TABS
1.0000 | ORAL_TABLET | Freq: Every day | ORAL | Status: DC
  Administered 2022-12-18 – 2022-12-19 (×2): 1 via ORAL
  Filled 2022-12-18 (×3): qty 1

## 2022-12-18 MED ORDER — ACETAMINOPHEN 650 MG RE SUPP: 650.0000 mg | Freq: Four times a day (QID) | RECTAL | Status: DC | PRN

## 2022-12-18 MED ORDER — METHOCARBAMOL 500 MG PO TABS
500.0000 mg | ORAL_TABLET | Freq: Three times a day (TID) | ORAL | Status: DC | PRN
  Administered 2022-12-18: 500 mg via ORAL
  Filled 2022-12-18: qty 1

## 2022-12-18 MED ORDER — POLYETHYLENE GLYCOL 3350 17 G PO PACK: 17.0000 g | PACK | Freq: Two times a day (BID) | ORAL | Status: DC | PRN

## 2022-12-18 MED ORDER — HYDRALAZINE HCL 25 MG PO TABS: 25.0000 mg | ORAL_TABLET | Freq: Four times a day (QID) | ORAL | Status: DC | PRN

## 2022-12-18 MED ORDER — CHLORHEXIDINE GLUCONATE CLOTH 2 % EX PADS
6.0000 | MEDICATED_PAD | Freq: Every day | CUTANEOUS | Status: DC
  Administered 2022-12-18: 6 via TOPICAL

## 2022-12-18 MED ORDER — VANCOMYCIN HCL 750 MG/150ML IV SOLN
750.0000 mg | INTRAVENOUS | Status: DC
  Administered 2022-12-18: 750 mg via INTRAVENOUS
  Filled 2022-12-18 (×2): qty 150

## 2022-12-18 MED ORDER — ANTICOAGULANT SODIUM CITRATE 4% (200MG/5ML) IV SOLN: 5.0000 mL | Status: DC | PRN

## 2022-12-18 NOTE — Progress Notes (Signed)
Received patient in bed to unit.  Alert and oriented.  Informed consent signed and in chart.   TX duration:3 hours  Patient tolerated well.  Transported back to the room  Alert, without acute distress.  Hand-off given to patient's nurse.   Access used: RIJ Access issues: none  Total UF removed: 2000 Medication(s) given: 750mg  vancomycin IV & gentamicin IV Post HD VS: 130/68 Pulse 78; temp 97.7 Post HD weight: 60.3 Report given to Eston Esters [facility]

## 2022-12-18 NOTE — Progress Notes (Signed)
Patient left AMA this morning with plan for outpatient HD antibiotics and catheter exchange. He presented to outpatient dialysis hypotensive, bradycardic and weak, and was sent back to the ED by his nephrologist, Dr. Ronalee Belts. Will plan HD tonight since he has not had dialysis yet today.   Rogers Blocker PA-C [employer]

## 2022-12-18 NOTE — Progress Notes (Signed)
ID Pharmacist Note     Noted patient left AMA. Nephrology planning to assist with catheter exchange and antibiotics with HD outpatient. Current plan is to treat empirically for endocarditis since echocardiography not complete per Dr. Thedore Mins. 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 She is checking to ensure that gentamicin can be given at the HD center.    Sharin Mons, PharmD, BCPS, BCIDP Infectious Diseases Clinical Pharmacist Phone: [phone] 12/18/2022 12:36 PM

## 2022-12-18 NOTE — Discharge Summary (Signed)
Physician Discharge Summary  Ronald Reeves ZOX:[id number] DOB: March 15, 1956 DOA: 12/16/2022  PCP: Benjiman Core, MD  Admit date: 12/16/2022 Discharge date: 12/18/2022 Admitted From: Home Disposition: Left AGAINST MEDICAL ADVICE Recommendations for Outpatient Follow-up:  ID recommended IV gentamicin and vancomycin with HD  ID also recommended line holiday but patient decided to leave AGAINST MEDICAL ADVICE Please follow up on the following pending results: Final result on repeat blood culture   Discharge Condition: Left AGAINST MEDICAL ADVICE CODE STATUS: Full code   Hospital course 67 year old M with PMH of ESRD on HD TTS, HFrEF, chronic back pain, polysubstance use (cocaine, alcohol, tobacco, marijuana)  HTN, GERD and BPH presented to [facility] with lower back pain, productive cough and progressive dyspnea, and admitted for CHF exacerbation, HCAP and lower back pain.  Reportedly had multiple falls at outpatient HD last week.  CXR with cardiomegaly but no acute pulmonary process.  CT angio chest/abdomen/pelvis/lumbar spine without significant finding.  Nephrology and cardiology consulted.   Blood culture with Enterococcus faecalis.  Antibiotics changed to IV ampicillin by ID.  Repeat blood culture and echocardiogram ordered.  The next day,  patient decided to leave AGAINST MEDICAL ADVICE despite encouragement to stay in in the hospital by multiple providers and his wife.  Patient is awake, alert and oriented x 4.  He has capacity to make his own medical decision and cannot be committed involuntarily.  ID recommended continuing vancomycin and gentamicin with HD at outpatient HD station.    See individual problem list below for more.   Problems addressed during this hospitalization Principal Problem:   Enterococcal bacteremia Active Problems:   ESRD (end stage renal disease) (HCC)   Acute on chronic systolic heart failure (HCC)   Enterococcus bacteremia -Management as  above. -Nephrology was to dialyze patient and remove HD cath before leaving AMA but patient refused.  Presumptive acute on chronic systolic CHF?  Appears euvolemic on exam.  CXR with cardiomegaly but no signs of pulmonary edema.  On room air.  CT chest/abdomen/pelvis without acute finding.  UDS positive for cocaine.   Possible HCAP, POA: On room air.  -Antibiotics as above   Coronary artery disease: No chest pain. -Continue home meds   Acute on chronic back pain: CT lumbar spine with DDD, L3-4 fixation and L4-5 laminectomy.  CT angio chest/abdomen/pelvis without acute finding.  Has relatively more weakness in LLE.  Pain seems to have improved.   ESRD on HD TTS/RUE fistula-received HD off schedule on 5/8. Right upper extremity fistula-Per vascular, fistula not ready for use. -Nephrology recommended HD before leaving AMA but patient refused   Hypertension: BP within acceptable range. -Continue home meds   GERD -Continue PPI   BPH -Continue tamsulosin.   Polysubstance abuse including tobacco and cocaine -Encouraged to refrain from substance use   Physical debility with recent falls -PT/OT   Nutrition           Time spent 35 minutes  Vital signs Vitals:   12/18/22 0010 12/18/22 0500 12/18/22 0535 12/18/22 0728  BP: (!) 105/33  114/69 (!) 115/56  Pulse: 73  73 71  Temp: 98.1 F (36.7 C)  97.6 F (36.4 C) 97.8 F (36.6 C)  Resp: 20  18 11   Height:      Weight:  61.3 kg    SpO2: 97%  96% 97%  TempSrc: Oral  Oral Oral  BMI (Calculated):  19.39       Discharge exam  GENERAL: No apparent distress.  Nontoxic. HEENT:  MMM.  Vision and hearing grossly intact.  NECK: Supple.  No apparent JVD.  RESP:  No IWOB.  Fair aeration bilaterally. CVS:  RRR. Heart sounds normal.  ABD/GI/GU: BS+. Abd soft, NTND.  MSK/EXT:  Moves extremities. No apparent deformity. No edema.  SKIN: no apparent skin lesion or wound NEURO: Awake and alert. Oriented x 4.  No apparent focal neuro  deficit. PSYCH: Calm. Normal affect.   Discharge Instructions  Allergies as of 12/18/2022   No Known Allergies   Med Rec must be completed prior to using this [facility]       Consultations: Nephrology Infectious disease Vascular surgery  Procedures/Studies:   CT L-SPINE NO CHARGE  Result Date: 12/17/2022 CLINICAL DATA:  Back pain EXAM: CT LUMBAR SPINE WITHOUT CONTRAST TECHNIQUE: Multidetector CT imaging of the lumbar spine was performed without intravenous contrast administration. Multiplanar CT image reconstructions were also generated. RADIATION DOSE REDUCTION: This exam was performed according to the departmental dose-optimization program which includes automated exposure control, adjustment of the mA and/or kV according to patient size and/or use of iterative reconstruction technique. COMPARISON:  Lumbar spine radiographs 12/06/2022 FINDINGS: Segmentation: 5 lumbar type vertebrae. Alignment: First degree anterolisthesis of L3 on L4.  Unchanged Vertebrae: The vertebral body heights are well preserved. No signs of acute fracture or subluxation. Status post bilateral pedicle screw and posterior rod fixation of L3-4. L4-5 laminectomy. Paraspinal and other soft tissues: Negative. Disc levels: Solid fusion of the L3-4 disc space. Large flowing ventral osteophytes identified at the remaining lumbar spine levels with relative preservation of the disc spaces. IMPRESSION: 1. No acute fracture or subluxation. 2. Status post posterior hardware fixation of L3-4 and laminectomy at L4-5. Marland Kitchen Electronically Signed   By: Signa Kell M.D.   On: 12/17/2022 05:19   CT Angio Chest/Abd/Pel for Dissection W and/or W/WO  Result Date: 12/17/2022 CLINICAL DATA:  Severe back pain. EXAM: CT ANGIOGRAPHY CHEST, ABDOMEN AND PELVIS TECHNIQUE: Non-contrast CT of the chest was initially obtained. Multidetector CT imaging through the chest, abdomen and pelvis was performed using the standard protocol during bolus  administration of intravenous contrast. Multiplanar reconstructed images and MIPs were obtained and reviewed to evaluate the vascular anatomy. RADIATION DOSE REDUCTION: This exam was performed according to the departmental dose-optimization program which includes automated exposure control, adjustment of the mA and/or kV according to patient size and/or use of iterative reconstruction technique. CONTRAST:  OMNIPAQUE IOHEXOL 350 MG/ML SOLN COMPARISON:  Chest radiograph dated 12/16/2022 and CT abdomen pelvis dated 08/05/2022. FINDINGS: Evaluation of this exam is limited due to respiratory motion artifact as well as streak artifact caused by spinal hardware.oh CTA CHEST FINDINGS Cardiovascular: There is mild cardiomegaly. No pericardial effusion. There is coronary vascular calcification primarily involving the LAD. Left IJ central venous line with tip at the junction of the right atrium and IVC. There is mild atherosclerotic calcification of the thoracic aorta. No aneurysmal dilatation or dissection. The origins of the great vessels of the aortic arch appear patent. No pulmonary artery embolus identified. Mediastinum/Nodes: No hilar or mediastinal adenopathy. Mild thickened appearance of the esophagus. Clinical correlation is recommended to evaluate for possibility of esophagitis. Small amount of residual fluid and ingested content noted in the esophagus. No mediastinal fluid collection. Lungs/Pleura: There is mild paraseptal emphysema. Faint bibasilar and lower lobe hazy and ground-glass densities concerning for edema. Atypical pneumonia is not excluded. Clinical correlation is recommended. No focal consolidation, pleural effusion, or pneumothorax. Linear atelectasis/scarring at the left lung base. The central airways  are patent. Musculoskeletal: Degenerative changes of the spine. No acute osseous pathology. Review of the MIP images confirms the above findings. CTA ABDOMEN AND PELVIS FINDINGS VASCULAR Aorta:  Mild atherosclerotic calcification. No aneurysmal dilatation or dissection. Celiac: Patent without evidence of aneurysm, dissection, vasculitis or significant stenosis. SMA: Atherosclerotic calcification of the SMA. The SMA remains patent. Renals: There is atherosclerotic calcification of the origin of the right renal artery. The renal arteries remain patent however, with diminished flow. IMA: The IMA is patent. Inflow: Mild atherosclerotic calcification of the iliac arteries. The iliac arteries are patent. No aneurysmal dilatation or dissection. Veins: The IVC is unremarkable.  No fullness gas. Review of the MIP images confirms the above findings. NON-VASCULAR No intra-abdominal free air.  Small free fluid within the pelvis. Hepatobiliary: The liver is unremarkable. No biliary dilatation. The gallbladder is unremarkable. Pancreas: The pancreas is grossly unremarkable. Spleen: Normal in size without focal abnormality. Adrenals/Urinary Tract: Probable right adrenal thickening/hyperplasia. The left adrenal gland is poorly visualized. There is moderate bilateral renal parenchyma atrophy. Small left renal interpolar cyst. There is no hydronephrosis on either side. The urinary bladder is grossly unremarkable. Stomach/Bowel: There is sigmoid diverticulosis without active inflammatory changes. There is no bowel obstruction or active inflammation. The appendix is unremarkable as visualized. Lymphatic: No adenopathy. Reproductive: The prostate and seminal vesicles are grossly remarkable. Other: None Musculoskeletal: Degenerative changes of the spine. Multilevel laminectomy and posterior fusion. No acute osseous pathology. Review of the MIP images confirms the above findings. IMPRESSION: 1. No aortic dissection or aneurysm. 2. Mild cardiomegaly with coronary vascular calcification primarily involving the LAD. 3. Faint bibasilar and lower lobe hazy and ground-glass densities concerning for edema. Atypical pneumonia is not  excluded. 4. Sigmoid diverticulosis without active inflammatory changes. No bowel obstruction. 5. Aortic Atherosclerosis (ICD10-I70.0) and Emphysema (ICD10-J43.9). Electronically Signed   By: Elgie Collard M.D.   On: 12/17/2022 01:28   DG Hip Unilat W or Wo Pelvis 2-3 Views Left  Result Date: 12/16/2022 CLINICAL DATA:  Left hip pain. EXAM: DG HIP (WITH OR WITHOUT PELVIS) 2-3V LEFT COMPARISON:  None Available. FINDINGS: Mild bilateral hip joint space narrowing with acetabular spurring. No fracture. No erosion, evidence of a vascular necrosis or focal bone abnormality. Pubic symphysis and sacroiliac joints are congruent. Postsurgical and degenerative change in the lower lumbar spine, partially included in the field of view. IMPRESSION: Mild bilateral hip osteoarthritis. Electronically Signed   By: Narda Rutherford M.D.   On: 12/16/2022 23:42   DG Chest Port 1 View  Result Date: 12/16/2022 CLINICAL DATA:  Severe upper back pain with cough. EXAM: PORTABLE CHEST 1 VIEW COMPARISON:  Chest x-ray 12/06/2022 FINDINGS: Patient is rotated. Left-sided central venous catheter tip projects over the right atrium, unchanged. The heart is enlarged similar to the prior study. Left lung base has been excluded from the examination. Visualized lungs are clear. There is no definite pleural effusion or pneumothorax. No acute fractures are seen. IMPRESSION: Cardiomegaly. No acute pulmonary process. Left lung base has been excluded from the examination. Electronically Signed   By: Darliss Cheney M.D.   On: 12/16/2022 22:52   DG Chest 2 View  Result Date: 12/06/2022 CLINICAL DATA:  Fall, near syncope. EXAM: CHEST - 2 VIEW; LUMBAR SPINE - COMPLETE 4+ VIEW COMPARISON:  12/31/2017, 09/20/2022. FINDINGS: Chest: The heart is enlarged and the mediastinal contour is within normal limits. Atherosclerotic calcification of the aorta is noted. No consolidation, effusion, or pneumothorax. A left internal jugular central venous catheter  terminates  over the right atrium. Degenerative changes are present in the thoracic spine. No acute osseous abnormality. Lumbar spine: There is no evidence of acute fracture in the lumbar spine. There is mild anterolisthesis at L4-L5. Cervical spinal fusion hardware is noted at L3-L4. Multilevel intervertebral disc space narrowing, endplate osteophyte formation, and facet arthropathy. IMPRESSION: 1. No acute cardiopulmonary process. 2. Cardiomegaly. 3. Multilevel degenerative changes in the cervical spine without evidence of acute fracture. Electronically Signed   By: Thornell Sartorius M.D.   On: 12/06/2022 21:31   DG Lumbar Spine Complete  Result Date: 12/06/2022 CLINICAL DATA:  Fall, near syncope. EXAM: CHEST - 2 VIEW; LUMBAR SPINE - COMPLETE 4+ VIEW COMPARISON:  12/31/2017, 09/20/2022. FINDINGS: Chest: The heart is enlarged and the mediastinal contour is within normal limits. Atherosclerotic calcification of the aorta is noted. No consolidation, effusion, or pneumothorax. A left internal jugular central venous catheter terminates over the right atrium. Degenerative changes are present in the thoracic spine. No acute osseous abnormality. Lumbar spine: There is no evidence of acute fracture in the lumbar spine. There is mild anterolisthesis at L4-L5. Cervical spinal fusion hardware is noted at L3-L4. Multilevel intervertebral disc space narrowing, endplate osteophyte formation, and facet arthropathy. IMPRESSION: 1. No acute cardiopulmonary process. 2. Cardiomegaly. 3. Multilevel degenerative changes in the cervical spine without evidence of acute fracture. Electronically Signed   By: Thornell Sartorius M.D.   On: 12/06/2022 21:31       The results of significant diagnostics from this hospitalization (including imaging, microbiology, ancillary and laboratory) are listed below for reference.     Microbiology: Recent Results (from the past 240 hour(s))  Blood culture (routine x 2)     Status: Abnormal (Preliminary  result)   Collection Time: 12/16/22 11:13 PM   Specimen: BLOOD  Result Value Ref Range Status   Specimen Description   Final    BLOOD BLOOD LEFT ARM Performed at [facility], [address], [place], [place] [address]    Special Requests   Final    BOTTLES DRAWN AEROBIC AND ANAEROBIC Blood Culture adequate volume Performed at [facility], [address], [place], [place] [address]    Culture  Setup Time   Final    GRAM POSITIVE COCCI IN BOTH AEROBIC AND ANAEROBIC BOTTLES CRITICAL RESULT CALLED TO, READ BACK BY AND VERIFIED WITH: RN Quenten Raven [phone] @ 1708  FH    Culture (A)  Final    ENTEROCOCCUS FAECALIS SUSCEPTIBILITIES TO FOLLOW Performed at [facility], [address] [place], [place] [address]    Report Status PENDING  Incomplete  Blood Culture ID Panel (Reflexed)     Status: Abnormal   Collection Time: 12/16/22 11:13 PM  Result Value Ref Range Status   Enterococcus faecalis DETECTED (A) NOT DETECTED Final    Comment: CRITICAL RESULT CALLED TO, READ BACK BY AND VERIFIED WITH: RN ITherese Sarah [phone] @ 1708  FH    Enterococcus Faecium NOT DETECTED NOT DETECTED Final   Listeria monocytogenes NOT DETECTED NOT DETECTED Final   Staphylococcus species NOT DETECTED NOT DETECTED Final   Staphylococcus aureus (BCID) NOT DETECTED NOT DETECTED Final   Staphylococcus epidermidis NOT DETECTED NOT DETECTED Final   Staphylococcus lugdunensis NOT DETECTED NOT DETECTED Final   Streptococcus species NOT DETECTED NOT DETECTED Final   Streptococcus agalactiae NOT DETECTED NOT DETECTED Final   Streptococcus pneumoniae NOT DETECTED NOT DETECTED Final   Streptococcus pyogenes NOT DETECTED NOT DETECTED Final   A.calcoaceticus-baumannii NOT  DETECTED NOT DETECTED Final   Bacteroides fragilis NOT DETECTED NOT DETECTED Final   Enterobacterales NOT DETECTED NOT DETECTED Final   Enterobacter cloacae complex NOT DETECTED NOT DETECTED Final   Escherichia  coli NOT DETECTED NOT DETECTED Final   Klebsiella aerogenes NOT DETECTED NOT DETECTED Final   Klebsiella oxytoca NOT DETECTED NOT DETECTED Final   Klebsiella pneumoniae NOT DETECTED NOT DETECTED Final   Proteus species NOT DETECTED NOT DETECTED Final   Salmonella species NOT DETECTED NOT DETECTED Final   Serratia marcescens NOT DETECTED NOT DETECTED Final   Haemophilus influenzae NOT DETECTED NOT DETECTED Final   Neisseria meningitidis NOT DETECTED NOT DETECTED Final   Pseudomonas aeruginosa NOT DETECTED NOT DETECTED Final   Stenotrophomonas maltophilia NOT DETECTED NOT DETECTED Final   Candida albicans NOT DETECTED NOT DETECTED Final   Candida auris NOT DETECTED NOT DETECTED Final   Candida glabrata NOT DETECTED NOT DETECTED Final   Candida krusei NOT DETECTED NOT DETECTED Final   Candida parapsilosis NOT DETECTED NOT DETECTED Final   Candida tropicalis NOT DETECTED NOT DETECTED Final   Cryptococcus neoformans/gattii NOT DETECTED NOT DETECTED Final   Vancomycin resistance NOT DETECTED NOT DETECTED Final    Comment: Performed at [facility], [address] [place], [place] [address]  MRSA Next Gen by PCR, Nasal     Status: None   Collection Time: 12/17/22  2:26 AM   Specimen: Nasal Mucosa; Nasal Swab  Result Value Ref Range Status   MRSA by PCR Next Gen NOT DETECTED NOT DETECTED Final    Comment: (NOTE) The GeneXpert MRSA Assay (FDA approved for NASAL specimens only), is one component of a comprehensive MRSA colonization surveillance program. It is not intended to diagnose MRSA infection nor to guide or monitor treatment for MRSA infections. Test performance is not FDA approved in patients less than 22 years old. Performed at [facility], [address] [place], [place] [address]   Culture, blood (Routine X 2) w Reflex to ID Panel     Status: None (Preliminary result)   Collection Time: 12/17/22  9:01 PM   Specimen: BLOOD  Result Value Ref Range  Status   Specimen Description BLOOD SITE NOT SPECIFIED  Final   Special Requests   Final    BOTTLES DRAWN AEROBIC AND ANAEROBIC Blood Culture results may not be optimal due to an excessive volume of blood received in culture bottles   Culture   Final    NO GROWTH < 12 HOURS Performed at [facility], [address] [place], [place] [address]    Report Status PENDING  Incomplete  Culture, blood (Routine X 2) w Reflex to ID Panel     Status: None (Preliminary result)   Collection Time: 12/17/22  9:01 PM   Specimen: BLOOD  Result Value Ref Range Status   Specimen Description BLOOD SITE NOT SPECIFIED  Final   Special Requests   Final    BOTTLES DRAWN AEROBIC AND ANAEROBIC Blood Culture results may not be optimal due to an excessive volume of blood received in culture bottles   Culture   Final    NO GROWTH < 12 HOURS Performed at [facility], [address] [place], [place] [address]    Report Status PENDING  Incomplete     Labs:  CBC: Recent Labs  Lab 12/16/22 2337 12/17/22 0553 12/18/22 1041  WBC 8.6 10.0 7.7  NEUTROABS 6.1 7.4  --   HGB 13.4 13.9 13.4  HCT 42.2 41.3 40.5  MCV 86.5 83.1 83.0  PLT 129* 126* 119*   BMP &GFR Recent Labs  Lab 12/16/22 2337 12/17/22 0553 12/18/22 1041  NA 138 135 133*  K 3.9 4.6 3.9  CL 107 97* 96*  CO2 21* 23 24  GLUCOSE 111* 137* 107*  BUN 27* 31* 38*  CREATININE 5.38* 6.42* 6.31*  CALCIUM 7.9* 9.4 8.7*  MG  --  2.2  --   PHOS  --  4.3 4.8*   Estimated Creatinine Clearance: 10.1 mL/min (A) (by C-G formula based on SCr of 6.31 mg/dL (H)). Liver & Pancreas: Recent Labs  Lab 12/16/22 2337 12/17/22 0553 12/18/22 1041  AST 20 20  --   ALT 14 16  --   ALKPHOS 52 60  --   BILITOT 0.8 0.8  --   PROT 6.5 6.9  --   ALBUMIN 2.9* 3.1* 2.7*   No results for input(s): "LIPASE", "AMYLASE" in the last 168 hours. No results for input(s): "AMMONIA" in the last 168 hours. Diabetic: No results for input(s): "HGBA1C" in the  last 72 hours. No results for input(s): "GLUCAP" in the last 168 hours. Cardiac Enzymes: No results for input(s): "CKTOTAL", "CKMB", "CKMBINDEX", "TROPONINI" in the last 168 hours. No results for input(s): "PROBNP" in the last 8760 hours. Coagulation Profile: Recent Labs  Lab 12/16/22 2337  INR 1.1   Thyroid Function Tests: No results for input(s): "TSH", "T4TOTAL", "FREET4", "T3FREE", "THYROIDAB" in the last 72 hours. Lipid Profile: No results for input(s): "CHOL", "HDL", "LDLCALC", "TRIG", "CHOLHDL", "LDLDIRECT" in the last 72 hours. Anemia Panel: No results for input(s): "VITAMINB12", "FOLATE", "FERRITIN", "TIBC", "IRON", "RETICCTPCT" in the last 72 hours. Urine analysis:    Component Value Date/Time   COLORURINE YELLOW 12/18/2022 0945   APPEARANCEUR HAZY (A) 12/18/2022 0945   LABSPEC 1.023 12/18/2022 0945   PHURINE 5.0 12/18/2022 0945   GLUCOSEU NEGATIVE 12/18/2022 0945   HGBUR SMALL (A) 12/18/2022 0945   BILIRUBINUR NEGATIVE 12/18/2022 0945   KETONESUR NEGATIVE 12/18/2022 0945   PROTEINUR 100 (A) 12/18/2022 0945   UROBILINOGEN 1.0 05/20/2013 0532   NITRITE NEGATIVE 12/18/2022 0945   LEUKOCYTESUR TRACE (A) 12/18/2022 0945   Sepsis Labs: Invalid input(s): "PROCALCITONIN", "LACTICIDVEN"   SIGNED:  Almon Hercules, MD  [facility] 12/18/2022, 2:01 PM

## 2022-12-18 NOTE — Progress Notes (Signed)
Pt was seen by all of the medical teams on the case and advised against AMA.  This Rn instructed patient and his wife who also encourage pt to take the advice of the medical team however he insisted that he wanted to leave AMA.  Document given to pt and he left the unit accompanied by his spouse

## 2022-12-18 NOTE — Progress Notes (Signed)
[facility] Progress Note   Subjective:   Blood cultures from 12/16/22 positive for enterococcus. On further questioning he does report some burning with urination for a few days. Reports itching around [facility] chronically but no redness or drainage. No fever or chills.   Patient signed off early from HD yesterday due to cramping. Denies SOB, CP, dizziness, nausea.   Explained to patient positive blood cultures, advised we will check a urine culture and he may need a line holiday. Patient is not happy with this plan and states he will leave at 10AM today. Discussed risks of untreated infection including death and he verbalizes understanding. His wife is present and would like him to stay. He reports he will at least stay for a urine test.   Objective Vitals:   12/18/22 0010 12/18/22 0500 12/18/22 0535 12/18/22 0728  BP: (!) 105/33  114/69 (!) 115/56  Pulse: 73  73 71  Resp: 20  18 11   Temp: 98.1 F (36.7 C)  97.6 F (36.4 C) 97.8 F (36.6 C)  TempSrc: Oral  Oral Oral  SpO2: 97%  96% 97%  Weight:  61.3 kg    Height:       Physical Exam General: Alert male, sitting in chair, NAD Heart: RRR, no murmurs, rubs or gallops Lungs: CTA bilaterally Abdomen: Soft, non-distended, +BS Extremities: No edema b/l lower extremities Dialysis Access: TDC, maturing RUE AVF +bruit  Additional Objective Labs: Basic Metabolic Panel: Recent Labs  Lab 12/16/22 2337 12/17/22 0553  NA 138 135  K 3.9 4.6  CL 107 97*  CO2 21* 23  GLUCOSE 111* 137*  BUN 27* 31*  CREATININE 5.38* 6.42*  CALCIUM 7.9* 9.4  PHOS  --  4.3   Liver Function Tests: Recent Labs  Lab 12/16/22 2337 12/17/22 0553  AST 20 20  ALT 14 16  ALKPHOS 52 60  BILITOT 0.8 0.8  PROT 6.5 6.9  ALBUMIN 2.9* 3.1*   No results for input(s): "LIPASE", "AMYLASE" in the last 168 hours. CBC: Recent Labs  Lab 12/16/22 2337 12/17/22 0553  WBC 8.6 10.0  NEUTROABS 6.1 7.4  HGB 13.4 13.9  HCT 42.2 41.3  MCV 86.5 83.1   PLT 129* 126*   Blood Culture    Component Value Date/Time   SDES BLOOD SITE NOT SPECIFIED 12/17/2022 2101   SDES BLOOD SITE NOT SPECIFIED 12/17/2022 2101   SPECREQUEST  12/17/2022 2101    BOTTLES DRAWN AEROBIC AND ANAEROBIC Blood Culture results may not be optimal due to an excessive volume of blood received in culture bottles   SPECREQUEST  12/17/2022 2101    BOTTLES DRAWN AEROBIC AND ANAEROBIC Blood Culture results may not be optimal due to an excessive volume of blood received in culture bottles   CULT  12/17/2022 2101    NO GROWTH < 12 HOURS Performed at [facility], [address], [place], [place] [address]    CULT  12/17/2022 2101    NO GROWTH < 12 HOURS Performed at [facility], [address], [place], [place] [address]    REPTSTATUS PENDING 12/17/2022 2101   REPTSTATUS PENDING 12/17/2022 2101    Cardiac Enzymes: No results for input(s): "CKTOTAL", "CKMB", "CKMBINDEX", "TROPONINI" in the last 168 hours. CBG: No results for input(s): "GLUCAP" in the last 168 hours. Iron Studies: No results for input(s): "IRON", "TIBC", "TRANSFERRIN", "FERRITIN" in the last 72 hours. @lablastinr3 @ Studies/Results: CT L-SPINE NO CHARGE  Result Date: 12/17/2022 CLINICAL DATA:  Back pain EXAM:  CT LUMBAR SPINE WITHOUT CONTRAST TECHNIQUE: Multidetector CT imaging of the lumbar spine was performed without intravenous contrast administration. Multiplanar CT image reconstructions were also generated. RADIATION DOSE REDUCTION: This exam was performed according to the departmental dose-optimization program which includes automated exposure control, adjustment of the mA and/or kV according to patient size and/or use of iterative reconstruction technique. COMPARISON:  Lumbar spine radiographs 12/06/2022 FINDINGS: Segmentation: 5 lumbar type vertebrae. Alignment: First degree anterolisthesis of L3 on L4.  Unchanged Vertebrae: The vertebral body heights are well preserved. No signs of acute  fracture or subluxation. Status post bilateral pedicle screw and posterior rod fixation of L3-4. L4-5 laminectomy. Paraspinal and other soft tissues: Negative. Disc levels: Solid fusion of the L3-4 disc space. Large flowing ventral osteophytes identified at the remaining lumbar spine levels with relative preservation of the disc spaces. IMPRESSION: 1. No acute fracture or subluxation. 2. Status post posterior hardware fixation of L3-4 and laminectomy at L4-5. Marland Kitchen Electronically Signed   By: Signa Kell M.D.   On: 12/17/2022 05:19   CT Angio Chest/Abd/Pel for Dissection W and/or W/WO  Result Date: 12/17/2022 CLINICAL DATA:  Severe back pain. EXAM: CT ANGIOGRAPHY CHEST, ABDOMEN AND PELVIS TECHNIQUE: Non-contrast CT of the chest was initially obtained. Multidetector CT imaging through the chest, abdomen and pelvis was performed using the standard protocol during bolus administration of intravenous contrast. Multiplanar reconstructed images and MIPs were obtained and reviewed to evaluate the vascular anatomy. RADIATION DOSE REDUCTION: This exam was performed according to the departmental dose-optimization program which includes automated exposure control, adjustment of the mA and/or kV according to patient size and/or use of iterative reconstruction technique. CONTRAST:  OMNIPAQUE IOHEXOL 350 MG/ML SOLN COMPARISON:  Chest radiograph dated 12/16/2022 and CT abdomen pelvis dated 08/05/2022. FINDINGS: Evaluation of this exam is limited due to respiratory motion artifact as well as streak artifact caused by spinal hardware.oh CTA CHEST FINDINGS Cardiovascular: There is mild cardiomegaly. No pericardial effusion. There is coronary vascular calcification primarily involving the LAD. Left IJ central venous line with tip at the junction of the right atrium and IVC. There is mild atherosclerotic calcification of the thoracic aorta. No aneurysmal dilatation or dissection. The origins of the great vessels of the aortic  arch appear patent. No pulmonary artery embolus identified. Mediastinum/Nodes: No hilar or mediastinal adenopathy. Mild thickened appearance of the esophagus. Clinical correlation is recommended to evaluate for possibility of esophagitis. Small amount of residual fluid and ingested content noted in the esophagus. No mediastinal fluid collection. Lungs/Pleura: There is mild paraseptal emphysema. Faint bibasilar and lower lobe hazy and ground-glass densities concerning for edema. Atypical pneumonia is not excluded. Clinical correlation is recommended. No focal consolidation, pleural effusion, or pneumothorax. Linear atelectasis/scarring at the left lung base. The central airways are patent. Musculoskeletal: Degenerative changes of the spine. No acute osseous pathology. Review of the MIP images confirms the above findings. CTA ABDOMEN AND PELVIS FINDINGS VASCULAR Aorta: Mild atherosclerotic calcification. No aneurysmal dilatation or dissection. Celiac: Patent without evidence of aneurysm, dissection, vasculitis or significant stenosis. SMA: Atherosclerotic calcification of the SMA. The SMA remains patent. Renals: There is atherosclerotic calcification of the origin of the right renal artery. The renal arteries remain patent however, with diminished flow. IMA: The IMA is patent. Inflow: Mild atherosclerotic calcification of the iliac arteries. The iliac arteries are patent. No aneurysmal dilatation or dissection. Veins: The IVC is unremarkable.  No fullness gas. Review of the MIP images confirms the above findings. NON-VASCULAR No intra-abdominal free air.  Small free fluid within the pelvis. Hepatobiliary: The liver is unremarkable. No biliary dilatation. The gallbladder is unremarkable. Pancreas: The pancreas is grossly unremarkable. Spleen: Normal in size without focal abnormality. Adrenals/Urinary Tract: Probable right adrenal thickening/hyperplasia. The left adrenal gland is poorly visualized. There is moderate  bilateral renal parenchyma atrophy. Small left renal interpolar cyst. There is no hydronephrosis on either side. The urinary bladder is grossly unremarkable. Stomach/Bowel: There is sigmoid diverticulosis without active inflammatory changes. There is no bowel obstruction or active inflammation. The appendix is unremarkable as visualized. Lymphatic: No adenopathy. Reproductive: The prostate and seminal vesicles are grossly remarkable. Other: None Musculoskeletal: Degenerative changes of the spine. Multilevel laminectomy and posterior fusion. No acute osseous pathology. Review of the MIP images confirms the above findings. IMPRESSION: 1. No aortic dissection or aneurysm. 2. Mild cardiomegaly with coronary vascular calcification primarily involving the LAD. 3. Faint bibasilar and lower lobe hazy and ground-glass densities concerning for edema. Atypical pneumonia is not excluded. 4. Sigmoid diverticulosis without active inflammatory changes. No bowel obstruction. 5. Aortic Atherosclerosis (ICD10-I70.0) and Emphysema (ICD10-J43.9). Electronically Signed   By: Elgie Collard M.D.   On: 12/17/2022 01:28   DG Hip Unilat W or Wo Pelvis 2-3 Views Left  Result Date: 12/16/2022 CLINICAL DATA:  Left hip pain. EXAM: DG HIP (WITH OR WITHOUT PELVIS) 2-3V LEFT COMPARISON:  None Available. FINDINGS: Mild bilateral hip joint space narrowing with acetabular spurring. No fracture. No erosion, evidence of a vascular necrosis or focal bone abnormality. Pubic symphysis and sacroiliac joints are congruent. Postsurgical and degenerative change in the lower lumbar spine, partially included in the field of view. IMPRESSION: Mild bilateral hip osteoarthritis. Electronically Signed   By: Narda Rutherford M.D.   On: 12/16/2022 23:42   DG Chest Port 1 View  Result Date: 12/16/2022 CLINICAL DATA:  Severe upper back pain with cough. EXAM: PORTABLE CHEST 1 VIEW COMPARISON:  Chest x-ray 12/06/2022 FINDINGS: Patient is rotated. Left-sided  central venous catheter tip projects over the right atrium, unchanged. The heart is enlarged similar to the prior study. Left lung base has been excluded from the examination. Visualized lungs are clear. There is no definite pleural effusion or pneumothorax. No acute fractures are seen. IMPRESSION: Cardiomegaly. No acute pulmonary process. Left lung base has been excluded from the examination. Electronically Signed   By: Darliss Cheney M.D.   On: 12/16/2022 22:52   Medications:  ampicillin (OMNIPEN) IV Stopped (12/17/22 2155)    acetaminophen  650 mg Oral Q6H WA   Chlorhexidine Gluconate Cloth  6 each Topical Daily   diclofenac Sodium  2 g Topical TID AC & HS   furosemide  80 mg Oral Daily   heparin injection (subcutaneous)  5,000 Units Subcutaneous Q8H   hydrALAZINE  25 mg Oral Daily   isosorbide mononitrate  30 mg Oral Daily   labetalol  100 mg Oral Daily   pantoprazole  40 mg Oral Daily   tamsulosin  0.8 mg Oral Daily    OP Dialysis Orders: Center: [facility]  on TTS. 180NRe 4 hours BFR 350 DFR A1.5 EDW 64kg 2K 3Ca  Heparin 5000 unit bolus pre-HD and 2500 unit bolus mid HD Left 0.6kg above EDW, past 3 treatments were shortened   Calcitriol PO q HD  Assessment/Plan:  Acute on chronic CHF: CXR with possible pneumonia via CHF. Has been shortening HD due to back pain. BNP also elevated, high suspicion for volume overload. Shortened HD yesterday but respiratory status is improved. Further UF with HD  today. Will need a lower EDW at discharge.  Enterococcus bacteremia: Initially concern for pneumonia, patient now also reporting dysuria. Checking a urine culture. ID onboard. May need a line holiday but patient is adamantly against staying in the hospital. Says he will stay for a urine test only. Wife is attempting to convince him to stay as well. He was informed of risks of leaving AMA including death.  Back pain: Acute on chronic, management per admitting team. Not reporting any pain this AM.    ESRD:  On TTS schedule but has been shortening treatments, see above. Extra HD yesterday but patient shortened treatment again. Next HD today.   Hypertension/volume: BP controlled, volume overloaded as above. On several BP meds outpatient, resume PRN  Anemia: Hgb 13.9, no ESA indicated.   Metabolic bone disease: Corrected calcium at goal. Continue VDRA and phos binder (recently switched to [place])  Dialysis access: Using AVF, had right first stage basilic vein transposition on 11/28/22. Has not been used again yet, needs second stage surgery. May need line holiday, see above. ID is on board.     Rogers Blocker, PA-C 12/18/2022, 9:08 AM  [facility] Pager: [phone]

## 2022-12-18 NOTE — ED Triage Notes (Signed)
Pt left AMA this morning from the floor, went home and then to dialysis and alittle hypotensive @ dialysis 101/68   Chronic back pain. Nothing new Alert and oriented.

## 2022-12-18 NOTE — Progress Notes (Signed)
Contacted [facility] to advise clinic of pt leaving AMA this morning.   Olivia Canter Renal Navigator [phone]

## 2022-12-18 NOTE — Consult Note (Signed)
[facility]    Date of Admission:  12/16/2022   Total days of inpatient antibiotics 2        Reason for Consult: E faecalis bacteremia    Principal Problem:   Enterococcal bacteremia Active Problems:   ESRD (end stage renal disease) (HCC)   Acute on chronic systolic heart failure [facility])   Assessment: 67 year old male admitted with:  #E faecalis bacteremia suspected secondary to HD cath #Lower lobe groundglass opacity likely secondary to CHF exacerbation versus secondary to cocaine(crack lung) - CT chest abdomen pelvis showed faint bibasilar lung lower lobe hazy groundglass densities concerning for edema, atypical pneumonia not excluded.  Sigmoid diverticulosis - CT L-spine noted status post hardware fixation L3-L4 laminectomy at L4/L5.  Hip x-ray showed bilateral hip osteoarthritis. - 1/1 blood culture on 5/7 growing E faecalis  Recommendations:  - Continue ampicillin - Follow repeat cultures on 5/8 to ensure clearance - TTE - UA ordered still pending.  Of note patient still produces some urine.  CT showed no concern for infection of the urinary bladder.   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 male ESRD on HD still makes urine, heart failure with reduced ejection fraction, polysubstance abuse to include cocaine and tobacco, hypertension, GERD, BPH initially presented to [facility] long ED with complaint of severe back pain, progressive dyspnea and productive cough.  History of multiple falls after HD.   He has a temp On his left chest as fistula had not been used yet.  Arrival patient afebrile, WBC 10K.Admitted for presented acute on chronic systolic failure, HCAP.  Found to have E faecalis bacteremia, ID engaged   Review of Systems: Review of Systems  All other systems reviewed and are negative.   Past Medical History:  Diagnosis Date   Anemia    Arthritis    Back pain    Bronchitis    Chronic kidney disease    dialysis on tues, thurs, sat   COPD (chronic obstructive pulmonary disease) (HCC)    Coronary artery disease    COVID    mild - flu like symptoms   Dyspnea    w/ exertion, uses inhaler   GERD (gastroesophageal reflux disease)    not a current problem   Heart murmur    never has caused any problems   Hypertension    Myocardial infarction (HCC)    Pneumonia    x 1   Pre-diabetes    diet controlled, no meds, does not check blood sugar   Substance abuse (HCC)     Social History   Tobacco Use   Smoking status: Every Day    Packs/day: .5    Types: Cigars, Cigarettes    Last attempt to quit: 2019    Years since quitting: 5.3    Passive exposure: Past   Smokeless tobacco: Never   Tobacco comments:    1-2 per day -  black and mild cigars  Vaping Use   Vaping Use: Never used  Substance Use Topics   Alcohol  use: Yes    Alcohol/week: 14.0 standard drinks of alcohol    Types: 14 Cans of beer per week    Comment: Daily one-two beers per day   Drug use: Yes    Frequency: 1.0 times per week    Types: Marijuana, Cocaine    Comment: marijuana occasional, cocaine - no longer using per pt 4/18/24once a week (11/19/22)    Family History  Problem Relation Age of Onset   Heart disease Mother    Heart attack Sister 67   Scheduled Meds:  acetaminophen  650 mg Oral Q6H WA   Chlorhexidine Gluconate Cloth  6 each Topical Daily   diclofenac Sodium  2 g Topical TID AC & HS   furosemide  80 mg Oral Daily   heparin injection (subcutaneous)  5,000 Units Subcutaneous Q8H    hydrALAZINE  25 mg Oral Daily   isosorbide mononitrate  30 mg Oral Daily   labetalol  100 mg Oral Daily   pantoprazole  40 mg Oral Daily   tamsulosin  0.8 mg Oral Daily   Continuous Infusions:  ampicillin (OMNIPEN) IV Stopped (12/17/22 2155)   PRN Meds:.iohexol, lidocaine, melatonin, methocarbamol, naLOXone (NARCAN)  injection, ondansetron (ZOFRAN) IV, oxyCODONE No Known Allergies  OBJECTIVE: Blood pressure (!) 115/56, pulse 71, temperature 97.8 F (36.6 C), temperature source Oral, resp. rate 11, height 5\' 10"  (1.778 m), weight 61.3 kg, SpO2 97 %.  Physical Exam Constitutional:      General: He is not in acute distress.    Appearance: He is normal weight. He is not toxic-appearing.  HENT:     Head: Normocephalic and atraumatic.     Right Ear: External ear normal.     Left Ear: External ear normal.     Nose: No congestion or rhinorrhea.     Mouth/Throat:     Mouth: Mucous membranes are moist.     Pharynx: Oropharynx is clear.  Eyes:     Extraocular Movements: Extraocular movements intact.     Conjunctiva/sclera: Conjunctivae normal.     Pupils: Pupils are equal, round, and reactive to light.  Cardiovascular:     Rate and Rhythm: Normal rate and regular rhythm.     Heart sounds: No murmur heard.    No friction rub. No gallop.  Pulmonary:     Effort: Pulmonary effort is normal.     Breath sounds: Normal breath sounds.  Abdominal:     General: Abdomen is flat. Bowel sounds are normal.     Palpations: Abdomen is soft.  Musculoskeletal:        General: No swelling. Normal range of motion.     Cervical back: Normal range of motion and neck supple.  Skin:    General: Skin is warm and dry.  Neurological:     General: No focal deficit present.     Mental Status: He is oriented to person, place, and time.  Psychiatric:        Mood and Affect: Mood normal.     Lab Results Lab Results  Component Value Date   WBC 10.0 12/17/2022   HGB 13.9 12/17/2022   HCT 41.3  12/17/2022   MCV 83.1 12/17/2022   PLT 126 (L) 12/17/2022    Lab Results  Component Value Date   CREATININE 6.42 (H) 12/17/2022   BUN 31 (H) 12/17/2022   NA 135 12/17/2022   K 4.6 12/17/2022   CL 97 (L) 12/17/2022   CO2 23 12/17/2022    Lab Results  Component  Value Date   ALT 16 12/17/2022   AST 20 12/17/2022   ALKPHOS 60 12/17/2022   BILITOT 0.8 12/17/2022       Danelle Earthly, MD [facility] 12/18/2022, 8:56 AM

## 2022-12-18 NOTE — Evaluation (Signed)
Occupational Therapy Evaluation Patient Details Name: Ronald Reeves MRN: [id number] DOB: Dec 15, 1955 Today's Date: 12/18/2022   History of Present Illness 67 y.o. male presents to [facility] hospital on 12/16/2022 with complaints of severe back pain, dyspnea and cough. Pt reports multiple falls recently. Workup concerning for CHF exacerbation and CAP. PMH includes ESRD, HFrEF, polysubstance abuse, HTN, GERD, BPH.   Clinical Impression   Pt admitted for above dx, PTA patient lived with spouse and has 24/7 home support ambulating with SPC and being independent with bathing and upper body dressing but wife assists with lower body dressing at baseline. Pt currently min guard assist for STS and ambulation but remains limited by constant back pain that radiates down LLE. Pt reports back pain is only present when standing/walking and is sharp when twisting but patient has no pain when twisting while supine in bed. Educated patient on whole body movements similar to post op back precautions to manage pain at this time, also discussed the need to manage and track BP to decrease the risk of falls.      Recommendations for follow up therapy are one component of a multi-disciplinary discharge planning process, led by the attending physician.  Recommendations may be updated based on patient status, additional functional criteria and insurance authorization.   Assistance Recommended at Discharge Set up Supervision/Assistance  Patient can return home with the following Assistance with cooking/housework;Assist for transportation;A little help with bathing/dressing/bathroom    Functional Status Assessment  Patient has had a recent decline in their functional status and demonstrates the ability to make significant improvements in function in a reasonable and predictable amount of time.  Equipment Recommendations  None recommended by OT    Recommendations for Other Services       Precautions / Restrictions  Precautions Precautions: Fall Restrictions Weight Bearing Restrictions: No      Mobility Bed Mobility Overal bed mobility: Needs Assistance Bed Mobility: Rolling, Sit to Supine Rolling: Supervision     Sit to supine: Supervision   General bed mobility comments: pt rec'd sitting in recliner    Transfers Overall transfer level: Needs assistance Equipment used: None Transfers: Sit to/from Stand Sit to Stand: Min guard                  Balance Overall balance assessment: Needs assistance Sitting-balance support: No upper extremity supported, Feet supported Sitting balance-Leahy Scale: Good     Standing balance support: Single extremity supported, Reliant on assistive device for balance Standing balance-Leahy Scale: Fair Standing balance comment: needs support due to back pain with dynamic balance, static standing to urinate no support                           ADL either performed or assessed with clinical judgement   ADL Overall ADL's : Needs assistance/impaired Eating/Feeding: Independent;Sitting   Grooming: Standing;Min guard   Upper Body Bathing: Sitting;Supervision/ safety;Set up   Lower Body Bathing: Minimal assistance;Sitting/lateral leans   Upper Body Dressing : Sitting;Supervision/safety   Lower Body Dressing: Sitting/lateral leans;Moderate assistance Lower Body Dressing Details (indicate cue type and reason): receives assist at baseline Toilet Transfer: Ambulation;Min guard   Toileting- Clothing Manipulation and Hygiene: Min guard;Sit to/from stand   Tub/ Shower Transfer: Ambulation;Min guard   Functional mobility during ADLs: Min guard (+ IV pole)       Vision         Perception     Praxis      Pertinent  Vitals/Pain Pain Assessment Pain Assessment: No/denies pain Pain Score: 6  Pain Location: low back, back pain gets sharp when pt twists while standing. Pain Descriptors / Indicators: Aching, Radiating, Constant, Sharp,  Jabbing Pain Intervention(s): Limited activity within patient's tolerance, Monitored during session, Repositioned     Hand Dominance     Extremity/Trunk Assessment Upper Extremity Assessment Upper Extremity Assessment: Overall WFL for tasks assessed   Lower Extremity Assessment LLE Deficits / Details: LLE hip flexion and knee extension 4-/5, ankle PF/DF WFL   Cervical / Trunk Assessment Cervical / Trunk Assessment: Kyphotic   Communication Communication Communication: No difficulties   Cognition Arousal/Alertness: Awake/alert Behavior During Therapy: WFL for tasks assessed/performed Overall Cognitive Status: Within Functional Limits for tasks assessed                                       General Comments  VSS on RA, pain constant 6/10 before session and following functional ambulation    Exercises     Shoulder Instructions      Home Living Family/patient expects to be discharged to:: Private residence Living Arrangements: Spouse/significant other Available Help at Discharge: Family;Available 24 hours/day Type of Home: House Home Access: Stairs to enter [employer] of Steps: 3 Entrance Stairs-Rails: None Home Layout: One level     Bathroom Shower/Tub: Chief Strategy Officer: Standard     Home Equipment: Rollator (4 wheels);Cane - single point;Shower seat   Additional Comments: pt has a tubseat at home, reports falls recently due to BP dropping resulting in him fainting      Prior Functioning/Environment Prior Level of Function : Independent/Modified Independent;Driving;History of Falls (last six months)             Mobility Comments: ambulates with SPC ADLs Comments: Pt reports independence in bathing and upper body dressing, wife assists with lower body dressing.        OT Problem List: Impaired balance (sitting and/or standing);Pain      OT Treatment/Interventions: Self-care/ADL training;Energy  conservation;Balance training;DME and/or AE instruction;Therapeutic exercise;Patient/family education;Therapeutic activities    OT Goals(Current goals can be found in the care plan section) Acute Rehab OT Goals Patient Stated Goal: to go home OT Goal Formulation: With patient Time For Goal Achievement: 01/01/23 Potential to Achieve Goals: Good ADL Goals Pt Will Transfer to Toilet: ambulating;with supervision Additional ADL Goal #1: Pt will demonstrate appropriate use of whole body movements prn to reduce back pain Additional ADL Goal #2: Pt will verbalize understanding of BP management and maintenance routine with min cueing for problem solving.  OT Frequency: Min 2X/week    Co-evaluation              AM-PAC OT "6 Clicks" Daily Activity     Outcome Measure Help from another person eating meals?: None Help from another person taking care of personal grooming?: A Little Help from another person toileting, which includes using toliet, bedpan, or urinal?: A Little Help from another person bathing (including washing, rinsing, drying)?: A Little Help from another person to put on and taking off regular upper body clothing?: A Little Help from another person to put on and taking off regular lower body clothing?: A Lot 6 Click Score: 18   End of Session Equipment Utilized During Treatment: Gait belt Nurse Communication: Mobility status  Activity Tolerance: Patient tolerated treatment well Patient left: in bed;with call bell/phone within reach  OT  Visit Diagnosis: Unsteadiness on feet (R26.81);History of falling (Z91.81);Pain Pain - Right/Left:  (back) Pain - part of body:  (back)                Time: 4098-1191 OT Time Calculation (min): 25 min Charges:  OT General Charges $OT Visit: 1 Visit OT Evaluation $OT Eval Moderate Complexity: 1 Mod OT Treatments $Therapeutic Activity: 8-22 mins  12/18/2022  AB, OTR/L  Acute Rehabilitation Services  Office: [phone]   Tristan Schroeder 12/18/2022, 10:29 AM

## 2022-12-18 NOTE — Plan of Care (Signed)
[facility] Patient Discharge Orders- [facility] CLINIC: [facility]  Patient's name: Ronald Reeves Admit/DC Dates: 12/16/2022 - 12/18/22 LEFT AMA  Discharge Diagnoses: Acute on Chronic CHF   Enterococcus bacteremia  Aranesp: Given: No   Date and amount of last dose: N/A  Last Hgb: 13.9 PRBC's Given: No Date/# of units: N/A ESA dose for discharge: none IV Iron dose at discharge: none  Heparin change: no  EDW Change: Yes New EDW: 61kg  Bath Change: No  Access intervention/Change: No  Details:  Hectorol/Calcitriol change: No  Discharge Labs: Calcium 9.4 Phosphorus 4.3 Albumin 3.1 K+ 4.6  IV Antibiotics: Vancomycin 1500mg  IV q HD for 6 weeks Please check vancomycin trough every Thursday  On Coumadin?: No Last INR: Next INR: Managed By:   OTHER/APPTS/LAB ORDERS: Pt leaving AMA with presumed TDC infection. Vancomycin ordered. Dr. Juel Burrow is arranging for outpatient catheter exchange at [facility]   D/C Meds to be reconciled by nurse after every discharge.  Completed By: Rogers Blocker, PA-C 12/18/2022, 10:57 AM  [facility] Pager: [phone]  Reviewed by: MD:______ RN_______

## 2022-12-18 NOTE — Progress Notes (Signed)
2D echo attempted, RN stated patient is leaving AMA.

## 2022-12-18 NOTE — H&P (Signed)
History and Physical    Patient: Ronald Reeves:[id number] DOB: 1956-01-08 DOA: 12/18/2022 DOS: the patient was seen and examined on 12/18/2022 PCP: Benjiman Core, MD  Patient coming from: Home  Chief Complaint: No chief complaint on file.  HPI: Ronald Reeves is a 66 y.o. male with PMH of ESRD on HD TTS, HFrEF, chronic back pain, polysubstance use (cocaine, alcohol, tobacco, marijuana)  HTN, GERD, BPH and Enterococcus bacteremia brought back to ED by EMS due to presyncope and low blood pressure at outpatient dialysis station after he left AMA early in the morning.  Patient was hospitalized on 5/7 and found to have Enterococcus bacteremia but left AMA on 5/9.  He drove to outpatient dialysis station.  He felt lightheaded and almost passed out when he got out of the car.  He was told his blood pressure was low, and EMS was dispatched.  He denies chest pain, shortness of breath, GI or UTI symptoms.  He denies focal neurosymptoms.  He denies fall or collapse but thinks he might have lost consciousness.  He says he woke up sitting on wheelchair when EMS arrived.  Denies bowel or bladder accidents.  He denies using recreational drug after he left the hospital.  He admits to smoking cigar.  Denies drinking alcohol.  In ED, vital stable.  No oxygen requirement.  Labs consistent with ESRD.  No leukocytosis.  Mild thrombocytopenia to 121.  Nephrology consulted and ordered hemodialysis.  Hospitalist service called for admission.  Review of Systems: As mentioned in the history of present illness. All other systems reviewed and are negative. Past Medical History:  Diagnosis Date   Anemia    Arthritis    Back pain    Bronchitis    Chronic kidney disease    dialysis on tues, thurs, sat   COPD (chronic obstructive pulmonary disease) (HCC)    Coronary artery disease    COVID    mild - flu like symptoms   Dyspnea    w/ exertion, uses inhaler   GERD (gastroesophageal reflux disease)    not a current  problem   Heart murmur    never has caused any problems   Hypertension    Myocardial infarction (HCC)    Pneumonia    x 1   Pre-diabetes    diet controlled, no meds, does not check blood sugar   Substance abuse (HCC)    Past Surgical History:  Procedure Laterality Date   AV FISTULA PLACEMENT Left 04/12/2021   Procedure: LEFT ARM ARTERIOVENOUS (AV) FISTULA CREATION;  Surgeon: Cephus Shelling, MD;  Location: [facility] OR;  Service: Vascular;  Laterality: Left;   AV FISTULA PLACEMENT Left 10/18/2021   Procedure: LEFT ARM BRACHIOBASILIC FISTULA CREATION FIRST STAGE;  Surgeon: Cephus Shelling, MD;  Location: [facility] OR;  Service: Vascular;  Laterality: Left;   AV FISTULA PLACEMENT Right 11/28/2022   Procedure: RIGHT ARM FIRST STAGE BRACHIOBASILIC FISTULA CREATION;  Surgeon: Cephus Shelling, MD;  Location: [facility] OR;  Service: Vascular;  Laterality: Right;   BACK SURGERY     BASCILIC VEIN TRANSPOSITION Left 01/27/2022   Procedure: LEFT SECOND STAGE BASILIC VEIN TRANSPOSITION;  Surgeon: Cephus Shelling, MD;  Location: [facility] OR;  Service: Vascular;  Laterality: Left;   BIOPSY  08/20/2022   Procedure: BIOPSY;  Surgeon: Shellia Cleverly, DO;  Location: [facility] END[facility]COPY;  Service: Gastroenterology;;   COLONOSCOPY WITH PROPOFOL N/A 08/20/2022   Procedure: COLONOSCOPY WITH PROPOFOL;  Surgeon: Shellia Cleverly, DO;  Location: [facility];  Service: Gastroenterology;  Laterality: N/A;   ESOPHAGOGASTRODUODENOSCOPY (EGD) WITH PROPOFOL N/A 08/20/2022   Procedure: ESOPHAGOGASTRODUODENOSCOPY (EGD) WITH PROPOFOL;  Surgeon: Shellia Cleverly, DO;  Location: [facility];  Service: Gastroenterology;  Laterality: N/A;   INSERTION OF DIALYSIS CATHETER Right 05/01/2022   Procedure: INSERTION OF DIALYSIS CATHETER;  Surgeon: [name], MD;  Location: [facility] OR;  Service: Vascular;  Laterality: Right;   LIGATION OF COMPETING BRANCHES OF ARTERIOVENOUS FISTULA Left 07/15/2021   Procedure: LIGATION OF COMPETING BRANCHES  OF LEFT RADIOCEPHALIC ARTERIOVENOUS FISTULA TIMES TWO;  Surgeon: Cephus Shelling, MD;  Location: [facility] OR;  Service: Vascular;  Laterality: Left;   POLYPECTOMY  08/20/2022   Procedure: POLYPECTOMY;  Surgeon: Shellia Cleverly, DO;  Location: [facility];  Service: Gastroenterology;;   REVISON OF ARTERIOVENOUS FISTULA Left 07/15/2021   Procedure: REVISON OF LEFT ARTERIOVENOUS FISTULA;  Surgeon: Cephus Shelling, MD;  Location: [facility] OR;  Service: Vascular;  Laterality: Left;   REVISON OF ARTERIOVENOUS FISTULA Left 05/01/2022   Procedure: ARTERIOVENOUS FISTULA WASHOUT OF ARM HEMATOMA;  Surgeon: [name], MD;  Location: [facility] OR;  Service: Vascular;  Laterality: Left;   Social History:  reports that he has been smoking cigars and cigarettes. He has been smoking an average of .5 packs per day. He has been exposed to tobacco smoke. He has never used smokeless tobacco. He reports current alcohol use of about 14.0 standard drinks of alcohol per week. He reports current drug use. Frequency: 1.00 time per week. Drugs: Marijuana and Cocaine.  No Known Allergies  Family History  Problem Relation Age of Onset   Heart disease Mother    Heart attack Sister 59    Prior to Admission medications   Medication Sig Start Date End Date Taking? Authorizing Provider  albuterol (VENTOLIN HFA) 108 (90 Base) MCG/ACT inhaler Inhale 2 puffs into the lungs every 6 (six) hours as needed for wheezing or shortness of breath.   Yes [provider]  amLODipine (NORVASC) 10 MG tablet Take 1 tablet (10 mg total) by mouth daily. 01/01/18  Yes Lewayne Bunting, MD  aspirin EC 81 MG tablet Take 81 mg by mouth daily.   Yes [provider]  diclofenac Sodium (VOLTAREN) 1 % GEL Apply 1 Application topically 2 (two) times daily as needed (pain).   Yes [provider]  furosemide (LASIX) 40 MG tablet Take 80 mg by mouth daily.   Yes [provider]  hydrALAZINE (APRESOLINE) 25 MG tablet Take  1 tablet (25 mg total) by mouth daily. Patient taking differently: Take 25 mg by mouth in the morning and at bedtime. 12/07/22  Yes Arrien, York Ram, MD  isosorbide mononitrate (IMDUR) 30 MG 24 hr tablet Take 1 tablet (30 mg total) by mouth daily. 01/01/18  Yes Lewayne Bunting, MD  labetalol (NORMODYNE) 100 MG tablet Take 2 tablets (200 mg total) by mouth every morning. Patient taking differently: Take 100 mg by mouth See admin instructions. 100 mg twice daily, except 1 tablet daily on dialysis days (taken after dialysis). 01/01/18  Yes Lewayne Bunting, MD  multivitamin (RENA-VIT) TABS tablet Take 1 tablet by mouth daily.   Yes [provider]  omeprazole (PRILOSEC) 40 MG capsule Take 1 capsule (40 mg total) by mouth daily. Patient taking differently: Take 40 mg by mouth daily as needed (heartburn). 06/06/22  Yes Arnaldo Natal, NP  tamsulosin (FLOMAX) 0.4 MG CAPS capsule Take 0.4 mg by mouth 2 (two) times daily.   Yes  [provider]  Blood Pressure KIT Please check blood pressure every day, and keep a log. Patient not taking: Reported on 12/17/2022 12/07/22   Arrien, York Ram, MD  oxyCODONE-acetaminophen (PERCOCET) 5-325 MG tablet Take 1 tablet by mouth every 6 (six) hours as needed for severe pain. Patient not taking: Reported on 12/18/2022 11/28/22   Garth Schlatter    Physical Exam: Vitals:   12/18/22 1725 12/18/22 1729 12/18/22 1800 12/18/22 1836  BP: (!) 143/80 (!) 142/71 (!) 148/71 (!) 153/90  Pulse: 78 74 65 76  Resp: 18 20 (!) 22 18  Temp: 98.1 F (36.7 C)     TempSrc:      SpO2: 97% 97% 97% 96%  Weight:      Height:       GENERAL: No apparent distress.  Nontoxic. HEENT: MMM.  Vision and hearing grossly intact.  NECK: Supple.  No apparent JVD.  RESP:  No IWOB.  Fair aeration bilaterally. CVS:  RRR. Heart sounds normal.  ABD/GI/GU: BS+. Abd soft, NTND.  MSK/EXT:   No apparent deformity.  Relative weakness in left leg  (chronic). SKIN: no apparent skin lesion or wound NEURO: Awake and alert. Oriented appropriately.  No apparent focal neuro deficit. PSYCH: Calm. Normal affect.  Data Reviewed: See HPI  Assessment and Plan: Principal Problem:   Enterococcal bacteremia Active Problems:   Cocaine dependence, continuous (HCC)   ESRD (end stage renal disease) (HCC)   Essential hypertension, malignant   Chronic back pain   Benign prostatic hyperplasia   Chronic HFrEF (heart failure with reduced ejection fraction) (HCC)    Enterococcus bacteremia: Blood culture on 5/7 with Enterococcus faecalis.  Repeat blood culture on 5/8 NGTD.  Hemodynamically stable.  -ID notified about patient's return after he left AMA. -Vancomycin, gentamicin and line holiday after HD. -TTE +/- TEE  Chronic HFrEF: Denies acute respiratory distress.  Appears euvolemic on exam. -Fluid management by dialysis  Coronary artery disease: No chest pain. -Continue home meds   Chronic back pain: CT lumbar spine with DDD, L3-4 fixation and L4-5 laminectomy.  CT angio chest/abdomen/pelvis without acute finding.  Has related weakness in left leg (chronic). -Pain control   ESRD on HD TTS/RUE fistula-received HD off schedule on 5/8. Right upper extremity fistula-Per vascular, fistula not ready for use. -Dialysis per nephrology -Needs line holiday after dialysis.   Hypertension: BP within acceptable range. -Resume home meds 1 at a time based on blood pressure   GERD -Continue PPI   BPH -Continue tamsulosin.   Polysubstance abuse including tobacco and cocaine -Encouraged to refrain from substance use   Physical debility with recent falls -PT/OT  Advance Care Planning:   Code Status: Full Code \  Consults: Nephrology and infectious disease  Family Communication: None at bedside  Severity of Illness: The appropriate patient status for this patient is INPATIENT. Inpatient status is judged to be reasonable and necessary in  order to provide the required intensity of service to ensure the patient's safety. The patient's presenting symptoms, physical exam findings, and initial radiographic and laboratory data in the context of their chronic comorbidities is felt to place them at high risk for further clinical deterioration. Furthermore, it is not anticipated that the patient will be medically stable for discharge from the hospital within 2 midnights of admission.   * I certify that at the point of admission it is my clinical judgment that the patient will require inpatient hospital care spanning beyond 2 midnights from the point of  admission due to high intensity of service, high risk for further deterioration and high frequency of surveillance required.*  Author: Almon Hercules, MD 12/18/2022 9:25 PM  For on call review [email or web address].

## 2022-12-18 NOTE — Progress Notes (Signed)
Pharmacy Antibiotic Note  Ronald Reeves is a 67 y.o. male admitted on 12/18/2022 with bacteremia.  Pharmacy has been consulted for vancomycin and gentamicin dosing.  Recently admitted for Enterococcus faecalis bacteremia and left AMA 12/18/22, but was sent from HD today after having hypotension and severe fatigue. ID was consulted and recommended vancomycin and gentamicin with HD in addition to a line exchange. ID was planning 6 weeks of therapy for empiric treatment of endocarditis. Patient is on TTS HD schedule.  WBC is 8.6 today and patient is afebrile. Nephrology plans for HD today. Patient loaded during last admission.  Plan: Vancomycin 750 mg after each HD, last dose June 20th Gentamicin 70 mg IV with each HD F/u clinical progress and plans for HD  Height: 5' 10.5" (179.1 cm) Weight: 61.3 kg (135 lb 2.3 oz) IBW/kg (Calculated) : 74.15  Temp (24hrs), Avg:98.2 F (36.8 C), Min:97.6 F (36.4 C), Max:99 F (37.2 C)  Recent Labs  Lab 12/16/22 2337 12/17/22 0553 12/18/22 1041 12/18/22 1450  WBC 8.6 10.0 7.7 8.6  CREATININE 5.38* 6.42* 6.31* 6.68*    Estimated Creatinine Clearance: 9.6 mL/min (A) (by C-G formula based on SCr of 6.68 mg/dL (H)).    No Known Allergies   Microbiology results: 5/7 BCx: E. Faecalis 5/8 Bxx: NGTD  Thank you for involving pharmacy in this patient's care.  Enos Fling, PharmD PGY2 Pharmacy Resident 12/18/2022 4:32 PM

## 2022-12-18 NOTE — Progress Notes (Signed)
   12/18/22 2030  Vitals  BP 130/68  MAP (mmHg) 78  BP Location Right Arm  BP Method Automatic  Patient Position (if appropriate) Lying  Pulse Rate 76  Pulse Rate Source Monitor  ECG Heart Rate 77  Resp 18  Oxygen Therapy  SpO2 96 %  O2 Device Room Air  Patient Activity (if Appropriate) In bed  Pulse Oximetry Type Continuous  During Treatment Monitoring  Blood Flow Rate (mL/min) 400 mL/min  Arterial Pressure (mmHg) -230 mmHg  Venous Pressure (mmHg) 240 mmHg  TMP (mmHg) 8 mmHg  Ultrafiltration Rate (mL/min) 2000 mL/min  Dialysate Flow Rate (mL/min) 300 ml/min  HD Safety Checks Performed Yes  Intra-Hemodialysis Comments Tx completed  Post Treatment  Dialyzer Clearance Lightly streaked  Duration of HD Treatment -hour(s) 3 hour(s)  Liters Processed 72  Fluid Removed (mL) 2000 mL  Tolerated HD Treatment Yes  Post-Hemodialysis Comments  (Pt stable post treatment.)  Hemodialysis Catheter Right Subclavian Double lumen Permanent (Tunneled)  Placement Date/Time: 05/01/22 1028   Serial / Lot #: [id number]  Expiration Date: 01/11/27  Time Out: Correct patient;Correct site;Correct procedure  Maximum sterile barrier precautions: Hand hygiene;Cap;Mask;Sterile gown;Sterile gloves;Large sterile ...  Site Condition No complications  Blue Lumen Status Heparin locked  Red Lumen Status Heparin locked  Purple Lumen Status N/A  Catheter fill solution Heparin 1000 units/ml  Catheter fill volume (Arterial) 2 cc  Catheter fill volume (Venous) 2  Dressing Type Transparent  Dressing Status Clean, Dry, Intact  Interventions New dressing  Drainage Description None  Dressing Change Due 12/24/22  Post treatment catheter status Capped and Clamped

## 2022-12-18 NOTE — ED Provider Notes (Signed)
[place] EMERGENCY DEPARTMENT AT [facility] Provider Note  CSN: [id number] Arrival date & time: 12/18/22 1437  Chief Complaint(s) No chief complaint on file.  HPI Ronald Reeves is a 67 y.o. male with history of end-stage renal disease, substance abuse, coronary artery disease, COPD, end-stage renal disease on Tuesday, Thursday, Saturday dialysis presenting with weakness.  Patient was just admitted for concern for bacteremia.  He was recommended to undergo further workup including TEE.  Ultimately today he decided to leave AMA.  He went to his regular dialysis appointment and his blood pressure was low, reports it was "100/50 something".  He reports that he felt lightheaded and dizzy.  Did not ultimately get dialysis and was sent back to the ER.  He reports at this time he is willing to be admitted.  When he was admitted he reported some back pain which is improving.  No bowel or bladder incontinence, numbness or tingling, weakness.  He reports that this was consistent with his chronic back pain.  Denies other acute process such as fevers or chills, cough, runny nose or sore throat, syncope.   Past Medical History Past Medical History:  Diagnosis Date   Anemia    Arthritis    Back pain    Bronchitis    Chronic kidney disease    dialysis on tues, thurs, sat   COPD (chronic obstructive pulmonary disease) (HCC)    Coronary artery disease    COVID    mild - flu like symptoms   Dyspnea    w/ exertion, uses inhaler   GERD (gastroesophageal reflux disease)    not a current problem   Heart murmur    never has caused any problems   Hypertension    Myocardial infarction (HCC)    Pneumonia    x 1   Pre-diabetes    diet controlled, no meds, does not check blood sugar   Substance abuse (HCC)    Patient Active Problem List   Diagnosis Date Noted   Acute on chronic systolic heart failure (HCC) 12/17/2022   Enterococcal bacteremia 12/17/2022   Orthostatic hypotension 12/06/2022    Polyp of sigmoid colon 08/20/2022   Diverticulosis of colon without hemorrhage 08/20/2022   Change in stool habits 08/20/2022   Gastritis and gastroduodenitis 08/20/2022   Nausea and vomiting 08/20/2022   GERD without esophagitis 08/20/2022   Failure to thrive in adult 06/17/2022   ESRD (end stage renal disease) (HCC) 01/07/2022   Alcohol dependence (HCC) 10/14/2021   Cervicalgia 10/14/2021   Cocaine dependence, continuous (HCC) 10/14/2021   Noncompliance with medication regimen 10/14/2021   Knee pain 10/14/2021   Osteoarthritis 10/14/2021   Tobacco abuse 10/14/2021   Acute upper respiratory infection, unspecified 10/14/2021   Age-related nuclear cataract, bilateral 10/14/2021   Benign prostatic hyperplasia 10/14/2021   Cannabis dependence (HCC) 10/14/2021   Combinations of drug dependence excluding opioid type drug, abuse (HCC) 10/14/2021   Coronary artery disease 10/14/2021   COVID-19 10/14/2021   Dyspnea on exertion 10/14/2021   Elevated PSA 10/14/2021   Encounter for screening for malignant neoplasm of respiratory organs 10/14/2021   Injury, other and unspecified, finger 10/14/2021   Left ventricular hypertrophy 10/14/2021   Legal circumstance 10/14/2021   Other and unspecified hyperlipidemia 10/14/2021   Pain in right foot 10/14/2021   Peripheral edema 10/14/2021   Polysubstance abuse (HCC) 10/14/2021   Prediabetes 10/14/2021   Recurrent major depressive disorder (HCC) 10/14/2021   Thoracic or lumbosacral neuritis or radiculitis 10/14/2021   Vitamin  D deficiency 10/14/2021   ARF (acute renal failure) (HCC) 07/18/2020   Anemia 07/18/2020   Proteinuria 01/25/2020   Chest pain 08/20/2017   Chronic back pain 08/20/2017   Cervical spondylosis without myelopathy 08/05/2017   Neural foraminal stenosis of cervical spine 08/05/2017   Peripheral neuropathy 05/30/2013   Chronic kidney disease 05/30/2013   Essential hypertension, malignant 05/30/2013   Cervical radiculopathy  05/30/2013   Acute exacerbation of chronic low back pain 05/30/2013   Dental caries 05/30/2013   Gastroesophageal reflux disease 08/12/2011   Home Medication(s) Prior to Admission medications   Medication Sig Start Date End Date Taking? Authorizing Provider  albuterol (VENTOLIN HFA) 108 (90 Base) MCG/ACT inhaler Inhale 2 puffs into the lungs every 6 (six) hours as needed for wheezing or shortness of breath.   Yes [provider]  amLODipine (NORVASC) 10 MG tablet Take 1 tablet (10 mg total) by mouth daily. 01/01/18  Yes Lewayne Bunting, MD  aspirin EC 81 MG tablet Take 81 mg by mouth daily.   Yes [provider]  diclofenac Sodium (VOLTAREN) 1 % GEL Apply 1 Application topically 2 (two) times daily as needed (pain).   Yes [provider]  furosemide (LASIX) 40 MG tablet Take 80 mg by mouth daily.   Yes [provider]  hydrALAZINE (APRESOLINE) 25 MG tablet Take 1 tablet (25 mg total) by mouth daily. Patient taking differently: Take 25 mg by mouth in the morning and at bedtime. 12/07/22  Yes Arrien, York Ram, MD  isosorbide mononitrate (IMDUR) 30 MG 24 hr tablet Take 1 tablet (30 mg total) by mouth daily. 01/01/18  Yes Lewayne Bunting, MD  labetalol (NORMODYNE) 100 MG tablet Take 2 tablets (200 mg total) by mouth every morning. Patient taking differently: Take 100 mg by mouth See admin instructions. 100 mg twice daily, except 1 tablet daily on dialysis days (taken after dialysis). 01/01/18  Yes Lewayne Bunting, MD  multivitamin (RENA-VIT) TABS tablet Take 1 tablet by mouth daily.   Yes [provider]  omeprazole (PRILOSEC) 40 MG capsule Take 1 capsule (40 mg total) by mouth daily. Patient taking differently: Take 40 mg by mouth daily as needed (heartburn). 06/06/22  Yes Arnaldo Natal, NP  tamsulosin (FLOMAX) 0.4 MG CAPS capsule Take 0.4 mg by mouth 2 (two) times daily.   Yes [provider]  Blood Pressure KIT Please  check blood pressure every day, and keep a log. Patient not taking: Reported on 12/17/2022 12/07/22   Arrien, York Ram, MD  oxyCODONE-acetaminophen (PERCOCET) 5-325 MG tablet Take 1 tablet by mouth every 6 (six) hours as needed for severe pain. Patient not taking: Reported on 12/18/2022 11/28/22   Garth Schlatter                                                                                                                                    Past Surgical  History Past Surgical History:  Procedure Laterality Date   AV FISTULA PLACEMENT Left 04/12/2021   Procedure: LEFT ARM ARTERIOVENOUS (AV) FISTULA CREATION;  Surgeon: Cephus Shelling, MD;  Location: [facility] OR;  Service: Vascular;  Laterality: Left;   AV FISTULA PLACEMENT Left 10/18/2021   Procedure: LEFT ARM BRACHIOBASILIC FISTULA CREATION FIRST STAGE;  Surgeon: Cephus Shelling, MD;  Location: [facility] OR;  Service: Vascular;  Laterality: Left;   AV FISTULA PLACEMENT Right 11/28/2022   Procedure: RIGHT ARM FIRST STAGE BRACHIOBASILIC FISTULA CREATION;  Surgeon: Cephus Shelling, MD;  Location: [facility] OR;  Service: Vascular;  Laterality: Right;   BACK SURGERY     BASCILIC VEIN TRANSPOSITION Left 01/27/2022   Procedure: LEFT SECOND STAGE BASILIC VEIN TRANSPOSITION;  Surgeon: Cephus Shelling, MD;  Location[facility] OR;  Service: Vascular;  Laterality: Left;   BIOPSY  08/20/2022   Procedure: BIOPSY;  Surgeon: Shellia Cleverly, DO;  Location: [facility] ENDOSCOPY;  Service: Gastroenterology;;   COLONOSCOPY WITH PROPOFOL N/A 08/20/2022   Procedure: COLONOSCOPY WITH PROPOFOL;  Surgeon: Shellia Cleverly, DO;  Location: [facility];  Service: Gastroenterology;  Laterality: N/A;   ESOPHAGOGASTRODUODENOSCOPY (EGD) WITH PROPOFOL N/A 08/20/2022   Procedure: ESOPHAGOGASTRODUODENOSCOPY (EGD) WITH PROPOFOL;  Surgeon: Shellia Cleverly, DO;  Location: [facility] END[facility]COPY;  Service: Gastroenterology;  Laterality: N/A;   INSERTION OF DIALYSIS CATHETER Right 05/01/2022    Procedure: INSERTION OF DIALYSIS CATHETER;  Surgeon: [name], MD;  Location: [facility] OR;  Service: Vascular;  Laterality: Right;   LIGATION OF COMPETING BRANCHES OF ARTERIOVENOUS FISTULA Left 07/15/2021   Procedure: LIGATION OF COMPETING BRANCHES OF LEFT RADIOCEPHALIC ARTERIOVENOUS FISTULA TIMES TWO;  Surgeon: Cephus Shelling, MD;  Location[facility] OR;  Service: Vascular;  Laterality: Left;   POLYPECTOMY  08/20/2022   Procedure: POLYPECTOMY;  Surgeon: Shellia Cleverly, DO;  Location: [facility] ENDOSCOPY;  Service: Gastroenterology;;   REVISON OF ARTERIOVENOUS FISTULA Left 07/15/2021   Procedure: REVISON OF LEFT ARTERIOVENOUS FISTULA;  Surgeon: Cephus Shelling, MD;  Location: [facility] OR;  Service: Vascular;  Laterality: Left;   REVISON OF ARTERIOVENOUS FISTULA Left 05/01/2022   Procedure: ARTERIOVENOUS FISTULA WASHOUT OF ARM HEMATOMA;  Surgeon: [name], MD;  Location: [facility] OR;  Service: Vascular;  Laterality: Left;   Family History Family History  Problem Relation Age of Onset   Heart disease Mother    Heart attack Sister 36    Social History Social History   Tobacco Use   Smoking status: Every Day    Packs/day: .5    Types: Cigars, Cigarettes    Last attempt to quit: 2019    Years since quitting: 5.3    Passive exposure: Past   Smokeless tobacco: Never   Tobacco comments:    1-2 per day -  black and mild cigars  Vaping Use   Vaping Use: Never used  Substance Use Topics   Alcohol use: Yes    Alcohol/week: 14.0 standard drinks of alcohol    Types: 14 Cans of beer per week    Comment: Daily one-two beers per day   Drug use: Yes    Frequency: 1.0 times per week    Types: Marijuana, Cocaine    Comment: marijuana occasional, cocaine - no longer using per pt 4/18/24once a week (11/19/22)   Allergies Patient has no known allergies.  Review of Systems Review of Systems  All other systems reviewed and are negative.   Physical Exam Vital Signs  I have reviewed the triage  vital signs  BP 117/69 (BP Location: Right Arm)   Pulse 72   Temp 97.8 F (36.6 C) (Oral)   Resp (!) 22   Ht 5' 10.5" (1.791 m)   Wt 61.3 kg   SpO2 98%   BMI 19.12 kg/m  Physical Exam Vitals and nursing note reviewed.  Constitutional:      General: He is not in acute distress.    Appearance: Normal appearance.  HENT:     Mouth/Throat:     Mouth: Mucous membranes are moist.  Eyes:     Conjunctiva/sclera: Conjunctivae normal.  Cardiovascular:     Rate and Rhythm: Normal rate and regular rhythm.  Pulmonary:     Effort: Pulmonary effort is normal. No respiratory distress.     Breath sounds: Normal breath sounds.  Abdominal:     General: Abdomen is flat.     Palpations: Abdomen is soft.     Tenderness: There is no abdominal tenderness.  Musculoskeletal:     Right lower leg: No edema.     Left lower leg: No edema.  Skin:    General: Skin is warm and dry.     Capillary Refill: Capillary refill takes less than 2 seconds.  Neurological:     Mental Status: He is alert and oriented to person, place, and time. Mental status is at baseline.     Comments: 2+ patellar reflexes bilaterally. 4+/5 in the LLE which patient reports is chronic and at baseline. 5/5 in RLE. No BLE sensory deficit. No midline back TTP   Psychiatric:        Mood and Affect: Mood normal.        Behavior: Behavior normal.     ED Results and Treatments Labs (all labs ordered are listed, but only abnormal results are displayed) Labs Reviewed  COMPREHENSIVE METABOLIC PANEL - Abnormal; Notable for the following components:      Result Value   Sodium 133 (*)    Chloride 94 (*)    Glucose, Bld 107 (*)    BUN 41 (*)    Creatinine, Ser 6.68 (*)    Calcium 8.8 (*)    Albumin 2.9 (*)    GFR, Estimated 9 (*)    All other components within normal limits  CBC WITH DIFFERENTIAL/PLATELET - Abnormal; Notable for the following components:   RDW 17.1 (*)    Platelets 121 (*)    Monocytes Absolute 1.2 (*)    All  other components within normal limits  URINALYSIS, ROUTINE W REFLEX MICROSCOPIC                                                                                                                          Radiology No results found.  Pertinent labs & imaging results that were available during my care of the patient were reviewed by me and considered in my medical decision making (see MDM for details).  Medications Ordered in ED Medications  Chlorhexidine Gluconate Cloth 2 % PADS 6 each (  has no administration in time range)  heparin injection 1,000 Units (has no administration in time range)  alteplase (CATHFLO ACTIVASE) injection 2 mg (has no administration in time range)  heparin injection 5,000 Units (has no administration in time range)  aspirin EC tablet 81 mg (has no administration in time range)  pantoprazole (PROTONIX) EC tablet 40 mg (has no administration in time range)  tamsulosin (FLOMAX) capsule 0.4 mg (has no administration in time range)  multivitamin (RENA-VIT) tablet 1 tablet (has no administration in time range)  heparin injection 5,000 Units (has no administration in time range)  acetaminophen (TYLENOL) tablet 650 mg (has no administration in time range)    Or  acetaminophen (TYLENOL) suppository 650 mg (has no administration in time range)  oxyCODONE (Oxy IR/ROXICODONE) immediate release tablet 5 mg (has no administration in time range)  methocarbamol (ROBAXIN) tablet 500 mg (has no administration in time range)  polyethylene glycol (MIRALAX / GLYCOLAX) packet 17 g (has no administration in time range)  senna-docusate (Senokot-S) tablet 1 tablet (has no administration in time range)  ondansetron (ZOFRAN) tablet 4 mg (has no administration in time range)    Or  ondansetron (ZOFRAN) injection 4 mg (has no administration in time range)  vancomycin (VANCOREADY) IVPB 750 mg/150 mL (has no administration in time range)  gentamicin (GARAMYCIN) 70 mg in dextrose 5 % 50 mL IVPB  (has no administration in time range)                                                                                                                                     Procedures Procedures  (including critical care time)  Medical Decision Making / ED Course   MDM:  67 year old male presenting to the emergency department with generalized weakness.  Patient overall very well-appearing, reports chronic back pain which is improved from time of earlier admission.  He has no other symptoms such as cough, abdominal pain. he is not having any symptoms of spinal cord compression such as acute numbness or tingling, bowel or bladder incontinence.  He reports he is ambulating at baseline.    Patient will need to stay in the hospital for further workup of his bacteremia.  At this time low concern for spinal cord abscess without change in his chronic back pain, fevers or chills, new neurologic symptoms.  He is being monitored inpatient and started on antibiotics.  Discussed the hospitalist will admit the patient to continue his septic workup.      Additional history obtained: -Additional history obtained from ems -External records from outside source obtained and reviewed including: Chart review including previous notes, labs, imaging, consultation notes including D/C summary today   Lab Tests: -I ordered, reviewed, and interpreted labs.   The pertinent results include:   Labs Reviewed  COMPREHENSIVE METABOLIC PANEL - Abnormal; Notable for the following components:      Result Value   Sodium 133 (*)  Chloride 94 (*)    Glucose, Bld 107 (*)    BUN 41 (*)    Creatinine, Ser 6.68 (*)    Calcium 8.8 (*)    Albumin 2.9 (*)    GFR, Estimated 9 (*)    All other components within normal limits  CBC WITH DIFFERENTIAL/PLATELET - Abnormal; Notable for the following components:   RDW 17.1 (*)    Platelets 121 (*)    Monocytes Absolute 1.2 (*)    All other components within normal limits   URINALYSIS, ROUTINE W REFLEX MICROSCOPIC    Notable for no leukocytosis, findings of chronic ESRD  Medicines ordered and prescription drug management: Meds ordered this encounter  Medications   Chlorhexidine Gluconate Cloth 2 % PADS 6 each   heparin injection 1,000 Units   alteplase (CATHFLO ACTIVASE) injection 2 mg   heparin injection 5,000 Units   aspirin EC tablet 81 mg   pantoprazole (PROTONIX) EC tablet 40 mg   tamsulosin (FLOMAX) capsule 0.4 mg   multivitamin (RENA-VIT) tablet 1 tablet   heparin injection 5,000 Units   OR Linked Order Group    acetaminophen (TYLENOL) tablet 650 mg    acetaminophen (TYLENOL) suppository 650 mg   oxyCODONE (Oxy IR/ROXICODONE) immediate release tablet 5 mg   methocarbamol (ROBAXIN) tablet 500 mg   polyethylene glycol (MIRALAX / GLYCOLAX) packet 17 g   senna-docusate (Senokot-S) tablet 1 tablet   OR Linked Order Group    ondansetron (ZOFRAN) tablet 4 mg    ondansetron (ZOFRAN) injection 4 mg   vancomycin (VANCOREADY) IVPB 750 mg/150 mL    Order Specific Question:   Indication:    Answer:   Bacteremia   gentamicin (GARAMYCIN) 70 mg in dextrose 5 % 50 mL IVPB    Order Specific Question:   Antibiotic Indication:    Answer:   Bacteremia    -I have reviewed the patients home medicines and have made adjustments as needed   Consultations Obtained: I requested consultation with the hospitalist,  and discussed lab and imaging findings as well as pertinent plan - they recommend: admission   Cardiac Monitoring: The patient was maintained on a cardiac monitor.  I personally viewed and interpreted the cardiac monitored which showed an underlying rhythm of: NSR  Social Determinants of Health:  Diagnosis or treatment significantly limited by social determinants of health: occasional cocaine use, no IVDU Co morbidities that complicate the patient evaluation  Past Medical History:  Diagnosis Date   Anemia    Arthritis    Back pain    Bronchitis     Chronic kidney disease    dialysis on tues, thurs, sat   COPD (chronic obstructive pulmonary disease) (HCC)    Coronary artery disease    COVID    mild - flu like symptoms   Dyspnea    w/ exertion, uses inhaler   GERD (gastroesophageal reflux disease)    not a current problem   Heart murmur    never has caused any problems   Hypertension    Myocardial infarction (HCC)    Pneumonia    x 1   Pre-diabetes    diet controlled, no meds, does not check blood sugar   Substance abuse (HCC)       Dispostion: Disposition decision including need for hospitalization was considered, and patient admitted to the hospital.    Final Clinical Impression(s) / ED Diagnoses Final diagnoses:  Bacteremia     This chart was dictated using voice recognition software.  Despite  best efforts to proofread,  errors can occur which can change the documentation meaning.    Lonell Grandchild, MD 12/18/22 [phone]

## 2022-12-18 NOTE — Clinical Documentation Improvement (Signed)
[facility]    Date of Admission:  12/16/2022   Total days of inpatient antibiotics 2        Reason for Consult: E faecalis bacteremia        Principal Problem:   Enterococcal bacteremia Active Problems:   ESRD (end stage renal disease) (HCC)   Acute on chronic systolic heart failure (HCC)   Assessment: 67 YM admitted with  #Efaecalis bacteremia 2/2 right HD cath infection -Reports right cath has been itching for a while that he scratches  #Lower lobe groundglass opacity likely secondary to CHF exacerbation versus secondary to cocaine(crack lung) - CT chest abdomen pelvis showed faint bibasilar lung lower lobe hazy groundglass densities concerning for edema, atypical pneumonia not excluded.  Sigmoid diverticulosis - CT L-spine noted status post hardware fixation L3-L4 laminectomy at L4/L5.  Hip x-ray showed bilateral hip osteoarthritis. 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  Of note patient still produces some urine.  CT showed no concern for infection of the urinary bladder.  I have personally spent 89 minutes involved in face-to-face and non-face-to-face activities for this patient on the day of the visit. Professional time spent includes the following activities: Preparing to see the patient (review of tests), Obtaining and/or reviewing separately obtained history (admission/discharge record), Performing a medically  appropriate examination and/or evaluation , Ordering medications/tests/procedures, referring and communicating with other health care professionals, Documenting clinical information in the EMR, Independently interpreting results (not separately reported), Communicating results to the patient/family/caregiver, Counseling and educating the patient/family/caregiver and Care coordination (not separately reported).   Microbiology:   Antibiotics: Ampicillin 5/08- Cefepime 5/07-08 Vancomycin 5/07  Cultures: Blood 5/71/1E faecalis 5/22/2 pending     HPI: Ronald Reeves is a 67 y.o. male ESRD on HD still makes urine, heart failure with reduced ejection fraction, polysubstance abuse to include cocaine and tobacco, hypertension, GERD, BPH initially presented to [facility] long ED with complaint of severe back pain, progressive dyspnea and productive cough.  History of multiple falls after HD.  He has a temp On his left chest as fistula had not been used yet.  Arrival patient afebrile, WBC 10K.Admitted for presented acute on chronic systolic failure, HCAP.  Found to have E faecalis bacteremia, ID engaged   Review of Systems: Review of Systems  All other systems reviewed and are negative.   Past Medical History:  Diagnosis Date   Anemia    Arthritis    Back pain    Bronchitis    Chronic kidney disease    dialysis on tues, thurs, sat   COPD (chronic obstructive pulmonary disease) (HCC)    Coronary artery disease    COVID    mild - flu like symptoms   Dyspnea    w/ exertion, uses inhaler   GERD (gastroesophageal reflux disease)    not a current problem   Heart murmur    never has caused any  problems   Hypertension    Myocardial infarction (HCC)    Pneumonia    x 1   Pre-diabetes    diet controlled, no meds, does not check blood sugar   Substance abuse (HCC)     Social History   Tobacco Use   Smoking status: Every Day    Packs/day: .5    Types: Cigars, Cigarettes    Last attempt to  quit: 2019    Years since quitting: 5.3    Passive exposure: Past   Smokeless tobacco: Never   Tobacco comments:    1-2 per day -  black and mild cigars  Vaping Use   Vaping Use: Never used  Substance Use Topics   Alcohol use: Yes    Alcohol/week: 14.0 standard drinks of alcohol    Types: 14 Cans of beer per week    Comment: Daily one-two beers per day   Drug use: Yes    Frequency: 1.0 times per week    Types: Marijuana, Cocaine    Comment: marijuana occasional, cocaine - no longer using per pt 4/18/24once a week (11/19/22)    Family History  Problem Relation Age of Onset   Heart disease Mother    Heart attack Sister 49   Scheduled Meds:  acetaminophen  650 mg Oral Q6H WA   Chlorhexidine Gluconate Cloth  6 each Topical Daily   Chlorhexidine Gluconate Cloth  6 each Topical Q0600   diclofenac Sodium  2 g Topical TID AC & HS   furosemide  80 mg Oral Daily   heparin injection (subcutaneous)  5,000 Units Subcutaneous Q8H   hydrALAZINE  25 mg Oral Daily   isosorbide mononitrate  30 mg Oral Daily   labetalol  100 mg Oral Daily   pantoprazole  40 mg Oral Daily   tamsulosin  0.8 mg Oral Daily   Continuous Infusions:  ampicillin (OMNIPEN) IV 2 g (12/18/22 0912)   anticoagulant sodium citrate     PRN Meds:.alteplase, anticoagulant sodium citrate, heparin, iohexol, lidocaine, melatonin, methocarbamol, naLOXone (NARCAN)  injection, ondansetron (ZOFRAN) IV, oxyCODONE No Known Allergies  OBJECTIVE: Blood pressure (!) 115/56, pulse 71, temperature 97.8 F (36.6 C), temperature source Oral, resp. rate 11, height 5\' 10"  (1.778 m), weight 61.3 kg, SpO2 97 %.  Physical Exam Constitutional:      General: He is not in acute distress.    Appearance: He is normal weight. He is not toxic-appearing.  HENT:     Head: Normocephalic and atraumatic.     Right Ear: External ear normal.     Left Ear: External ear normal.     Nose: No congestion or rhinorrhea.     Mouth/Throat:     Mouth:  Mucous membranes are moist.     Pharynx: Oropharynx is clear.  Eyes:     Extraocular Movements: Extraocular movements intact.     Conjunctiva/sclera: Conjunctivae normal.     Pupils: Pupils are equal, round, and reactive to light.  Cardiovascular:     Rate and Rhythm: Normal rate and regular rhythm.     Heart sounds: No murmur heard.    No friction rub. No gallop.     Comments: Left chest port Pulmonary:     Effort: Pulmonary effort is normal.     Breath sounds: Normal breath sounds.  Abdominal:     General: Abdomen is flat. Bowel sounds are normal.     Palpations: Abdomen is soft.  Musculoskeletal:        General: No swelling.  Normal range of motion.     Cervical back: Normal range of motion and neck supple.  Skin:    General: Skin is warm and dry.  Neurological:     General: No focal deficit present.     Mental Status: He is oriented to person, place, and time.  Psychiatric:        Mood and Affect: Mood normal.     Lab Results Lab Results  Component Value Date   WBC 7.7 12/18/2022   HGB 13.4 12/18/2022   HCT 40.5 12/18/2022   MCV 83.0 12/18/2022   PLT 119 (L) 12/18/2022    Lab Results  Component Value Date   CREATININE 6.31 (H) 12/18/2022   BUN 38 (H) 12/18/2022   NA 133 (L) 12/18/2022   K 3.9 12/18/2022   CL 96 (L) 12/18/2022   CO2 24 12/18/2022    Lab Results  Component Value Date   ALT 16 12/17/2022   AST 20 12/17/2022   ALKPHOS 60 12/17/2022   BILITOT 0.8 12/17/2022       Danelle Earthly, MD [facility] 12/18/2022, 12:10 PM

## 2022-12-19 ENCOUNTER — Inpatient Hospital Stay (HOSPITAL_COMMUNITY): Payer: No Typology Code available for payment source

## 2022-12-19 DIAGNOSIS — R7881 Bacteremia: Secondary | ICD-10-CM | POA: Diagnosis not present

## 2022-12-19 DIAGNOSIS — B952 Enterococcus as the cause of diseases classified elsewhere: Secondary | ICD-10-CM | POA: Diagnosis not present

## 2022-12-19 HISTORY — PX: IR REMOVAL TUN CV CATH W/O FL: IMG2289

## 2022-12-19 LAB — CBC
HCT: 42.8 % (ref 39.0–52.0)
Hemoglobin: 13.7 g/dL (ref 13.0–17.0)
MCH: 27.1 pg (ref 26.0–34.0)
MCHC: 32 g/dL (ref 30.0–36.0)
MCV: 84.8 fL (ref 80.0–100.0)
Platelets: 123 10*3/uL — ABNORMAL LOW (ref 150–400)
RBC: 5.05 MIL/uL (ref 4.22–5.81)
RDW: 16.8 % — ABNORMAL HIGH (ref 11.5–15.5)
WBC: 7.3 10*3/uL (ref 4.0–10.5)
nRBC: 0 % (ref 0.0–0.2)

## 2022-12-19 LAB — ECHOCARDIOGRAM COMPLETE
AR max vel: 3.64 cm2
AV Area VTI: 3.98 cm2
AV Area mean vel: 3.64 cm2
AV Mean grad: 4 mmHg
AV Peak grad: 7.7 mmHg
Ao pk vel: 1.39 m/s
Area-P 1/2: 3.27 cm2
Height: 70 in
S' Lateral: 4.3 cm
Single Plane A4C EF: 36.4 %
Weight: 2194.02 oz

## 2022-12-19 LAB — RENAL FUNCTION PANEL
Albumin: 2.8 g/dL — ABNORMAL LOW (ref 3.5–5.0)
Anion gap: 17 — ABNORMAL HIGH (ref 5–15)
BUN: 27 mg/dL — ABNORMAL HIGH (ref 8–23)
CO2: 25 mmol/L (ref 22–32)
Calcium: 9 mg/dL (ref 8.9–10.3)
Chloride: 95 mmol/L — ABNORMAL LOW (ref 98–111)
Creatinine, Ser: 4.99 mg/dL — ABNORMAL HIGH (ref 0.61–1.24)
GFR, Estimated: 12 mL/min — ABNORMAL LOW (ref >60)
Glucose, Bld: 125 mg/dL — ABNORMAL HIGH (ref 70–99)
Phosphorus: 4.5 mg/dL (ref 2.5–4.6)
Potassium: 3.5 mmol/L (ref 3.5–5.1)
Sodium: 137 mmol/L (ref 135–145)

## 2022-12-19 LAB — CULTURE, BLOOD (ROUTINE X 2)

## 2022-12-19 LAB — HEPATITIS B SURFACE ANTIBODY, QUANTITATIVE: Hep B S AB Quant (Post): 42.3 m[IU]/mL (ref >9.9)

## 2022-12-19 LAB — MAGNESIUM: Magnesium: 1.9 mg/dL (ref 1.7–2.4)

## 2022-12-19 MED ORDER — SODIUM CHLORIDE 0.9 % IV SOLN
2.0000 g | Freq: Two times a day (BID) | INTRAVENOUS | Status: DC
  Administered 2022-12-19 (×2): 2 g via INTRAVENOUS
  Filled 2022-12-19 (×3): qty 2000

## 2022-12-19 MED ORDER — CHLORHEXIDINE GLUCONATE 4 % EX SOLN
CUTANEOUS | Status: AC
  Filled 2022-12-19: qty 15

## 2022-12-19 MED ORDER — LIDOCAINE HCL 1 % IJ SOLN
INTRAMUSCULAR | Status: AC
  Filled 2022-12-19: qty 20

## 2022-12-19 MED ORDER — SODIUM CHLORIDE 0.9 % IV SOLN
2.0000 g | Freq: Two times a day (BID) | INTRAVENOUS | Status: DC
  Administered 2022-12-19 (×2): 2 g via INTRAVENOUS
  Filled 2022-12-19 (×2): qty 20

## 2022-12-19 NOTE — Procedures (Signed)
Interventional Radiology Procedure Note  Risks and benefits of removal of hemodialysis catheter were discussed with the patient including, but not limited to bleeding, hematoma, infection, damage to adjacent structures.  All of the patient's questions were answered, patient is agreeable to proceed. Consent signed and in chart. PROCEDURE SUMMARY:  Successful removal of tunneled dialysis catheter.  No complications.   EBL = trace  Please see full dictation in imaging section of Epic for procedure details.  Alene Mires NP 12/19/2022 11:34 AM

## 2022-12-19 NOTE — Progress Notes (Signed)
  Echocardiogram 2D Echocardiogram has been performed.  Niharika Savino Wynn Banker 12/19/2022, 3:53 PM

## 2022-12-19 NOTE — Evaluation (Signed)
Occupational Therapy Evaluation Patient Details Name: Ronald Reeves MRN: [id number] DOB: [birth year] Today's Date: 12/19/2022   History of Present Illness Pt is a 67 y.o. male presented [facility] hospital on 12/16/2022 with complaints of severe back pain, dyspnea and cough and was admitted. Pt left AMA on 12/18/22. Pt readmitted later the same day after presenting to ER with episode of dizziness and lightheadedness upon arrival to HD. Pt reports multiple falls recently. Workup concerning for CHF exacerbation and CAP. PMH includes ESRD, HFrEF, polysubstance abuse, HTN, GERD, BPH.   Clinical Impression   At baseline PLOF pt is Independent to Mod I with ADLs, functional transfers, and functional mobility with a SPC. Pt reports his wife has recently been assisting with LB dressing due to increased dizziness and lightheadedness when leaning forward. Pt currently demonstrates ability to complete UB ADLs in sitting with Independent to Supervision and LB ADLs sitting/lateral leans with Min to Mod assist. Functional transfers and functional mobility during ADLs not attempted this day secondary to episode of hypotension with sitting EOB. OT to assess functional transfers/mobility further in future sessions. Pt will benefits from acute skilled OT services to increase safety and independence with ADLs, functional transfers, and functional mobility. No post acute skilled OT services are indicated at this time.      Recommendations for follow up therapy are one component of a multi-disciplinary discharge planning process, led by the attending physician.  Recommendations may be updated based on patient status, additional functional criteria and insurance authorization.   Assistance Recommended at Discharge Intermittent Supervision/Assistance  Patient can return home with the following A little help with walking and/or transfers;A little help with bathing/dressing/bathroom;Assistance with cooking/housework;Assist for  transportation;Help with stairs or ramp for entrance    Functional Status Assessment  Patient has had a recent decline in their functional status and demonstrates the ability to make significant improvements in function in a reasonable and predictable amount of time.  Equipment Recommendations  None recommended by OT    Recommendations for Other Services       Precautions / Restrictions Precautions Precautions: Fall Restrictions Weight Bearing Restrictions: No      Mobility Bed Mobility Overal bed mobility: Needs Assistance Bed Mobility: Supine to Sit, Sit to Supine     Supine to sit: Supervision Sit to supine: Supervision        Transfers Overall transfer level: Needs assistance                 General transfer comment: Not attempted this day secondary to episode of hypotension with sitting EOB.      Balance Overall balance assessment: Needs assistance Sitting-balance support: No upper extremity supported, Feet supported Sitting balance-Leahy Scale: Good                                     ADL either performed or assessed with clinical judgement   ADL Overall ADL's : Needs assistance/impaired Eating/Feeding: Independent;Sitting   Grooming: Modified independent;Sitting   Upper Body Bathing: Supervision/ safety;Sitting   Lower Body Bathing: Minimal assistance;Sitting/lateral leans   Upper Body Dressing : Supervision/safety;Sitting   Lower Body Dressing: Moderate assistance;Sit to/from Market researcher Details (indicate cue type and reason): Not attempted this day secondary to episode of hypotension with sitting EOB.           General ADL Comments: Functional transfers and functional mobility during ADls not assessed this day  secondary to episode of hypotension with sitting EOB (BP 82/46 sitting EOB for approx. 3 min, BP 121/82 after returning to supine for approx. 3 min). OT to asess futher during functional activities in  further skilled OT sessions.     Vision Baseline Vision/History: 1 Wears glasses Ability to See in Adequate Light: 0 Adequate Patient Visual Report: No change from baseline       Perception     Praxis Praxis Praxis tested?: Within functional limits    Pertinent Vitals/Pain Pain Assessment Pain Assessment: Faces Faces Pain Scale: Hurts little more Pain Location: Lower back Pain Descriptors / Indicators: Aching, Grimacing Pain Intervention(s): Limited activity within patient's tolerance, Monitored during session     Hand Dominance Right   Extremity/Trunk Assessment Upper Extremity Assessment Upper Extremity Assessment: Overall WFL for tasks assessed   Lower Extremity Assessment Lower Extremity Assessment: Defer to PT evaluation   Cervical / Trunk Assessment Cervical / Trunk Assessment: Kyphotic   Communication Communication Communication: No difficulties   Cognition Arousal/Alertness: Awake/alert Behavior During Therapy: WFL for tasks assessed/performed Overall Cognitive Status: Within Functional Limits for tasks assessed                                       General Comments  Pt with episode of hypotension with sitting EOB this session. Alll other VS stable on RA.    Exercises     Shoulder Instructions      Home Living Family/patient expects to be discharged to:: Private residence Living Arrangements: Spouse/significant other Available Help at Discharge: Family;Available 24 hours/day Type of Home: House Home Access: Stairs to enter [employer] of Steps: 3 Entrance Stairs-Rails: None Home Layout: One level     Bathroom Shower/Tub: Chief Strategy Officer: Standard     Home Equipment: Rollator (4 wheels);Cane - single point;Shower seat          Prior Functioning/Environment Prior Level of Function : Independent/Modified Independent;Driving;History of Falls (last six months)             Mobility Comments:  ambulates with SPC ADLs Comments: Pt reports he is Independent to Mod I with ADLs at baseline. Pt reports wife has assisted with LB dressing recently due to dizziness and lightheadedness when bending forward.        OT Problem List: Decreased activity tolerance;Impaired balance (sitting and/or standing);Pain      OT Treatment/Interventions: Self-care/ADL training;Therapeutic exercise;Energy conservation;DME and/or AE instruction;Therapeutic activities;Patient/family education;Balance training    OT Goals(Current goals can be found in the care plan section) Acute Rehab OT Goals Patient Stated Goal: Pt would like to return home and not have dizziness or episodes of "falling out." OT Goal Formulation: With patient Time For Goal Achievement: 01/02/23 Potential to Achieve Goals: Good ADL Goals Pt Will Perform Lower Body Bathing: with supervision;sitting/lateral leans;with adaptive equipment Pt Will Perform Lower Body Dressing: with supervision;with adaptive equipment;sit to/from stand Pt Will Transfer to Toilet: with supervision;ambulating;regular height toilet Pt Will Perform Toileting - Clothing Manipulation and hygiene: with supervision;sit to/from stand Additional ADL Goal #1: Pt will verbalize understanding of BP management and maintenance routine with min cueing for problem solving.  OT Frequency: Min 2X/week    Co-evaluation              AM-PAC OT "6 Clicks" Daily Activity     Outcome Measure Help from another person eating meals?: None Help from another person  taking care of personal grooming?: None (in sitting) Help from another person toileting, which includes using toliet, bedpan, or urinal?: A Little Help from another person bathing (including washing, rinsing, drying)?: A Little Help from another person to put on and taking off regular upper body clothing?: A Little Help from another person to put on and taking off regular lower body clothing?: A Lot 6 Click Score: 19    End of Session Nurse Communication: Mobility status;Other (comment) (Episode of hypotension)  Activity Tolerance: Other (comment) (Pt limited secondary to episode of hypotension.) Patient left: in bed;with call bell/phone within reach;with bed alarm set  OT Visit Diagnosis: Unsteadiness on feet (R26.81);History of falling (Z91.81);Pain                Time: [id number] OT Time Calculation (min): 16 min Charges:  OT General Charges $OT Visit: 1 Visit OT Evaluation $OT Eval Low Complexity: 1 Low  Xin Klawitter "Kyle" M., OTR/L, MA Acute Rehab [phone]   Lendon Colonel 12/19/2022, 3:01 PM

## 2022-12-19 NOTE — Progress Notes (Signed)
PROGRESS NOTE        PATIENT DETAILS Name: Ronald Reeves Age: 67 y.o. Sex: male Date of Birth: 15-Sep-1955 Admit Date: 12/18/2022 Admitting Physician Almon Hercules, MD [id number], Rosalita Chessman, MD  Brief Summary: Patient is a 67 y.o.  male with history of ESRD on HD TTS, chronic HFrEF, HTN, BPH, polysubstance abuse-who recently left the hospital AGAINST MEDICAL ADVICE on 5/9 after being admitted for CHF exacerbation/enterococcal bacteremia-presented back to the ED on the same day and was subsequently admitted to [facility].    Significant events: 5/7-5/9>> hospitalized at [facility] for HFrEF exacerbation-blood cultures positive for Enterococcus-signed out AMA. 5/9>> admit to [facility]  Significant studies: 5/7>> CXR: No acute process. 5/7>> x-ray left hip/pelvis: Bilateral hip osteoarthritis. 5/8>> CT L-spine: No fracture/subluxation 5/8>> CT angio chest/abdomen/pelvis: No aortic dissection/aneurysm.  Significant microbiology data: 5/7>> blood culture: Enterococcus 5/8>> blood culture: No growth  Procedures: None  Consults: Renal IR ID  Subjective: Lying comfortably in bed-denies any chest pain or shortness of breath.  Objective: Vitals: Blood pressure 121/82, pulse 70, temperature 98.3 F (36.8 C), temperature source Oral, resp. rate (!) 22, height 5\' 10"  (1.778 m), weight 62.2 kg, SpO2 99 %.   Exam: Gen Exam:Alert awake-not in any distress HEENT:atraumatic, normocephalic Chest: B/L clear to auscultation anteriorly CVS:S1S2 regular Abdomen:soft non tender, non distended Extremities:no edema Neurology: Non focal Skin: no rash  Pertinent Labs/Radiology:    Latest Ref Rng & Units 12/19/2022    4:07 AM 12/18/2022    2:50 PM 12/18/2022   10:41 AM  CBC  WBC 4.0 - 10.5 K/uL 7.3  8.6  7.7   Hemoglobin 13.0 - 17.0 g/dL 96.2  95.2  84.1   Hematocrit 39.0 - 52.0 % 42.8  41.3  40.5   Platelets 150 - 400 K/uL 123  121  119     Lab Results  Component Value  Date   NA 137 12/19/2022   K 3.5 12/19/2022   CL 95 (L) 12/19/2022   CO2 25 12/19/2022      Assessment/Plan: Enterococcal bacteremia Afebrile-nontoxic-appearing Likely from HD catheter related infection On IV vancomycin/gentamicin-ID following Discussed with nephrology-HD catheter to be removed today-with plans for a line holiday over the weekend.  ESRD on HD TTS Nephrology following Per prior notes-has right upper extremity fistula-which is not yet ready for use.  Chronic HFrEF Euvolemic Volume removal with HD  CAD No anginal symptoms Continue aspirin/Imdur/beta-blocker  HTN BP stable Continue Imdur/beta-blocker  GERD PPI  BPH Apparently still makes urine Flomax  Chronic back pain-s/p L3-4 fixation and L4-5 laminectomy. Per prior notes-as associated left leg weakness that is chronic As needed narcotics/Robaxin  Polysubstance abuse (cocaine/tobacco) Counseled  Underweight: Estimated body mass index is 19.68 kg/m as calculated from the following:   Height as of this encounter: 5\' 10"  (1.778 m).   Weight as of this encounter: 62.2 kg.   Code status:   Code Status: Full Code   DVT Prophylaxis: heparin injection 5,000 Units Start: 12/18/22 2200    Family Communication: Spouse at bedside   Disposition Plan: Status is: Inpatient Remains inpatient appropriate because: Severity of illness   Planned Discharge Destination:Home   Diet: Diet Order             Diet renal with fluid restriction Fluid restriction: 1200 mL Fluid; Room service appropriate? Yes; Fluid consistency: Thin  Diet effective now  Antimicrobial agents: Anti-infectives (From admission, onward)    Start     Dose/Rate Route Frequency Ordered Stop   12/18/22 2000  vancomycin (VANCOREADY) IVPB 750 mg/150 mL        750 mg 150 mL/hr over 60 Minutes Intravenous Every T-Th-Sa (Hemodialysis) 12/18/22 1642     12/18/22 2000  gentamicin (GARAMYCIN) 70 mg in  dextrose 5 % 50 mL IVPB        70 mg 103.5 mL/hr over 30 Minutes Intravenous Every T-Th-Sa (Hemodialysis) 12/18/22 1642          MEDICATIONS: Scheduled Meds:  aspirin EC  81 mg Oral Daily   Chlorhexidine Gluconate Cloth  6 each Topical Q0600   heparin  5,000 Units Subcutaneous Q8H   isosorbide mononitrate  30 mg Oral Daily   labetalol  100 mg Oral BID   multivitamin  1 tablet Oral QHS   pantoprazole  40 mg Oral Daily   tamsulosin  0.4 mg Oral BID   Continuous Infusions:  gentamicin Stopped (12/18/22 2122)   vancomycin Stopped (12/18/22 2040)   PRN Meds:.acetaminophen **OR** acetaminophen, alteplase, fentaNYL (SUBLIMAZE) injection, heparin, hydrALAZINE, methocarbamol, ondansetron **OR** ondansetron (ZOFRAN) IV, oxyCODONE, polyethylene glycol, senna-docusate   I have personally reviewed following labs and imaging studies  LABORATORY DATA: CBC: Recent Labs  Lab 12/16/22 2337 12/17/22 0553 12/18/22 1041 12/18/22 1450 12/19/22 0407  WBC 8.6 10.0 7.7 8.6 7.3  NEUTROABS 6.1 7.4  --  5.8  --   HGB 13.4 13.9 13.4 13.6 13.7  HCT 42.2 41.3 40.5 41.3 42.8  MCV 86.5 83.1 83.0 84.6 84.8  PLT 129* 126* 119* 121* 123*    Basic Metabolic Panel: Recent Labs  Lab 12/16/22 2337 12/17/22 0553 12/18/22 1041 12/18/22 1450 12/19/22 0407  NA 138 135 133* 133* 137  K 3.9 4.6 3.9 3.7 3.5  CL 107 97* 96* 94* 95*  CO2 21* 23 24 25 25   GLUCOSE 111* 137* 107* 107* 125*  BUN 27* 31* 38* 41* 27*  CREATININE 5.38* 6.42* 6.31* 6.68* 4.99*  CALCIUM 7.9* 9.4 8.7* 8.8* 9.0  MG  --  2.2  --   --  1.9  PHOS  --  4.3 4.8*  --  4.5    GFR: Estimated Creatinine Clearance: 13 mL/min (A) (by C-G formula based on SCr of 4.99 mg/dL (H)).  Liver Function Tests: Recent Labs  Lab 12/16/22 2337 12/17/22 0553 12/18/22 1041 12/18/22 1450 12/19/22 0407  AST 20 20  --  16  --   ALT 14 16  --  12  --   ALKPHOS 52 60  --  54  --   BILITOT 0.8 0.8  --  0.5  --   PROT 6.5 6.9  --  6.9  --    ALBUMIN 2.9* 3.1* 2.7* 2.9* 2.8*   No results for input(s): "LIPASE", "AMYLASE" in the last 168 hours. No results for input(s): "AMMONIA" in the last 168 hours.  Coagulation Profile: Recent Labs  Lab 12/16/22 2337  INR 1.1    Cardiac Enzymes: No results for input(s): "CKTOTAL", "CKMB", "CKMBINDEX", "TROPONINI" in the last 168 hours.  BNP (last 3 results) No results for input(s): "PROBNP" in the last 8760 hours.  Lipid Profile: No results for input(s): "CHOL", "HDL", "LDLCALC", "TRIG", "CHOLHDL", "LDLDIRECT" in the last 72 hours.  Thyroid Function Tests: No results for input(s): "TSH", "T4TOTAL", "FREET4", "T3FREE", "THYROIDAB" in the last 72 hours.  Anemia Panel: No results for input(s): "VITAMINB12", "FOLATE", "FERRITIN", "TIBC", "IRON", "RETICCTPCT" in the  last 72 hours.  Urine analysis:    Component Value Date/Time   COLORURINE YELLOW 12/18/2022 0945   APPEARANCEUR HAZY (A) 12/18/2022 0945   LABSPEC 1.023 12/18/2022 0945   PHURINE 5.0 12/18/2022 0945   GLUCOSEU NEGATIVE 12/18/2022 0945   HGBUR SMALL (A) 12/18/2022 0945   BILIRUBINUR NEGATIVE 12/18/2022 0945   KETONESUR NEGATIVE 12/18/2022 0945   PROTEINUR 100 (A) 12/18/2022 0945   UROBILINOGEN 1.0 05/20/2013 0532   NITRITE NEGATIVE 12/18/2022 0945   LEUKOCYTESUR TRACE (A) 12/18/2022 0945    Sepsis Labs: Lactic Acid, Venous    Component Value Date/Time   LATICACIDVEN 0.8 04/30/2022 1051    MICROBIOLOGY: Recent Results (from the past 240 hour(s))  Blood culture (routine x 2)     Status: Abnormal   Collection Time: 12/16/22 11:13 PM   Specimen: BLOOD  Result Value Ref Range Status   Specimen Description   Final    BLOOD BLOOD LEFT ARM Performed at [facility], [address], [place], [place] [address]    Special Requests   Final    BOTTLES DRAWN AEROBIC AND ANAEROBIC Blood Culture adequate volume Performed at [facility], [address], [place], [place]  [address]    Culture  Setup Time   Final    GRAM POSITIVE COCCI IN BOTH AEROBIC AND ANAEROBIC BOTTLES CRITICAL RESULT CALLED TO, READ BACK BY AND VERIFIED WITH: RN Quenten Raven [phone] @ 1708  FH Performed at [facility], [address], [place], [place] [address]    Culture ENTEROCOCCUS FAECALIS (A)  Final   Report Status 12/19/2022 FINAL  Final   Organism ID, Bacteria ENTEROCOCCUS FAECALIS  Final      Susceptibility   Enterococcus faecalis - MIC*    AMPICILLIN <=2 SENSITIVE Sensitive     VANCOMYCIN 1 SENSITIVE Sensitive     GENTAMICIN SYNERGY RESISTANT Resistant     * ENTEROCOCCUS FAECALIS  Blood Culture ID Panel (Reflexed)     Status: Abnormal   Collection Time: 12/16/22 11:13 PM  Result Value Ref Range Status   Enterococcus faecalis DETECTED (A) NOT DETECTED Final    Comment: CRITICAL RESULT CALLED TO, READ BACK BY AND VERIFIED WITH: RN ITherese Sarah [phone] @ 1708  FH    Enterococcus Faecium NOT DETECTED NOT DETECTED Final   Listeria monocytogenes NOT DETECTED NOT DETECTED Final   Staphylococcus species NOT DETECTED NOT DETECTED Final   Staphylococcus aureus (BCID) NOT DETECTED NOT DETECTED Final   Staphylococcus epidermidis NOT DETECTED NOT DETECTED Final   Staphylococcus lugdunensis NOT DETECTED NOT DETECTED Final   Streptococcus species NOT DETECTED NOT DETECTED Final   Streptococcus agalactiae NOT DETECTED NOT DETECTED Final   Streptococcus pneumoniae NOT DETECTED NOT DETECTED Final   Streptococcus pyogenes NOT DETECTED NOT DETECTED Final   A.calcoaceticus-baumannii NOT DETECTED NOT DETECTED Final   Bacteroides fragilis NOT DETECTED NOT DETECTED Final   Enterobacterales NOT DETECTED NOT DETECTED Final   Enterobacter cloacae complex NOT DETECTED NOT DETECTED Final   Escherichia coli NOT DETECTED NOT DETECTED Final   Klebsiella aerogenes NOT DETECTED NOT DETECTED Final   Klebsiella oxytoca NOT DETECTED NOT DETECTED Final   Klebsiella pneumoniae NOT DETECTED NOT DETECTED  Final   Proteus species NOT DETECTED NOT DETECTED Final   Salmonella species NOT DETECTED NOT DETECTED Final   Serratia marcescens NOT DETECTED NOT DETECTED Final   Haemophilus influenzae NOT DETECTED NOT DETECTED Final   Neisseria meningitidis NOT DETECTED NOT DETECTED Final   Pseudomonas aeruginosa NOT  DETECTED NOT DETECTED Final   Stenotrophomonas maltophilia NOT DETECTED NOT DETECTED Final   Candida albicans NOT DETECTED NOT DETECTED Final   Candida auris NOT DETECTED NOT DETECTED Final   Candida glabrata NOT DETECTED NOT DETECTED Final   Candida krusei NOT DETECTED NOT DETECTED Final   Candida parapsilosis NOT DETECTED NOT DETECTED Final   Candida tropicalis NOT DETECTED NOT DETECTED Final   Cryptococcus neoformans/gattii NOT DETECTED NOT DETECTED Final   Vancomycin resistance NOT DETECTED NOT DETECTED Final    Comment: Performed at [facility], [address] [place], [place] [address]  MRSA Next Gen by PCR, Nasal     Status: None   Collection Time: 12/17/22  2:26 AM   Specimen: Nasal Mucosa; Nasal Swab  Result Value Ref Range Status   MRSA by PCR Next Gen NOT DETECTED NOT DETECTED Final    Comment: (NOTE) The GeneXpert MRSA Assay (FDA approved for NASAL specimens only), is one component of a comprehensive MRSA colonization surveillance program. It is not intended to diagnose MRSA infection nor to guide or monitor treatment for MRSA infections. Test performance is not FDA approved in patients less than 87 years old. Performed at [facility], [address], [place], [place] [address]   Culture, blood (Routine X 2) w Reflex to ID Panel     Status: None (Preliminary result)   Collection Time: 12/17/22  9:01 PM   Specimen: BLOOD  Result Value Ref Range Status   Specimen Description BLOOD SITE NOT SPECIFIED  Final   Special Requests   Final    BOTTLES DRAWN AEROBIC AND ANAEROBIC Blood Culture results may not be optimal due to an excessive volume of  blood received in culture bottles   Culture   Final    NO GROWTH 2 DAYS Performed at [facility], [address], [place], [place] [address]    Report Status PENDING  Incomplete  Culture, blood (Routine X 2) w Reflex to ID Panel     Status: None (Preliminary result)   Collection Time: 12/17/22  9:01 PM   Specimen: BLOOD  Result Value Ref Range Status   Specimen Description BLOOD SITE NOT SPECIFIED  Final   Special Requests   Final    BOTTLES DRAWN AEROBIC AND ANAEROBIC Blood Culture results may not be optimal due to an excessive volume of blood received in culture bottles   Culture   Final    NO GROWTH 2 DAYS Performed at [facility], [address] [place], [place] [address]    Report Status PENDING  Incomplete    RADIOLOGY STUDIES/RESULTS: No results found.   LOS: 1 day   Jeoffrey Massed, MD  [facility]    To contact the attending provider between 7A-7P or the covering provider during after hours 7P-7A, please log into the web site [email or web address] and access using universal [place] password for that web site. If you do not have the password, please call the hospital operator.  12/19/2022, 9:54 AM

## 2022-12-19 NOTE — Consult Note (Addendum)
[facility]    Date of Admission:  12/18/2022   Total days of inpatient antibiotics 0        Reason for Consult: E faecalis bacteremia    Principal Problem:   Enterococcal bacteremia Active Problems:   Essential hypertension, malignant   Chronic back pain   Cocaine dependence, continuous (HCC)   Benign prostatic hyperplasia   ESRD (end stage renal disease) (HCC)   Chronic HFrEF (heart failure with reduced ejection fraction) (HCC)   Assessment:  67 YM admitted with:  #Efaecalis bacteremia 2/2 left HD cath infection(inserted on 09/08/22->exchanged on 09/24/22) #Left AMA and returned same day on 5/9 -Reports right cath has been itching for a while that he scratches  #Lower lobe groundglass opacity likely secondary to CHF exacerbation versus secondary to cocaine(crack lung) - CT chest abdomen pelvis showed faint bibasilar lung lower lobe hazy groundglass densities concerning for edema, atypical pneumonia not excluded.  Sigmoid diverticulosis - CT L-spine noted status post hardware fixation L3-L4 laminectomy at L4/L5.  Hip x-ray showed bilateral hip osteoarthritis. - 1/1 blood culture on 5/7 growing E faecalis. Left AMA yesterday and  on 5/9 and returned the same day. As he had left AMA we had recc van+ gent(dosed with HD). Will do AMP + CTX as pt has returned -Colonoscopy on 08/20/22, 2 polyps no malignancy noted   #ESRD on HD via right subclavian double-lumen - Placed on 05/01/2022 - Left forearm fistula is not ready to use yet   #Presumptive acute on chronic systolic heart failure - Management per primary - UDS positive for cocaine   #Polysubstance abuse - Recent cocaine user -HAV/HBV immune, HCV NR on 12/18/22 - HIV negative   Recommendations:  -Repeat blood Cx as pt left AMA - Start ampicillin and ceftriaxone - TTE, will need TEE - Pull catheter.(Dr. Bryson Dames, Rad Dr Juliette Alcide aware) - UA ordered still pending, of note patient still  produces some urine.  CT showed no concern for infection of the urinary bladder. Unlikely the course of bacteremia  Dr. Drue Second following this weekend Microbiology:   Antibiotics: Ampicillin 5/08-5/9 Cefepime 5/07-08 Vancomycin 5/07- Gent 5/9-   Cultures: Blood 5/7 1/1 E faecalis 5/2 2/2 pending  HPI: Ronald Reeves is a 67 y.o. male ESRD on HD still makes urine, heart failure with reduced ejection fraction, polysubstance abuse to include cocaine and tobacco, hypertension, GERD, BPH initially presented to [facility] long ED with complaint of severe back pain, progressive dyspnea and productive cough.  History of multiple falls after HD.  He has a temp On his left chest as fistula had not been used yet.  Arrival patient afebrile, WBC 10K.Admitted for presented acute on chronic systolic failure, HCAP.  Found to have E faecalis bacteremia, ID engaged he was started on vanc+ gent. Left AMA and returned same day.  Noted that he dropped outpatient dialysis station.  Felt lightheaded almost passed out.  EMS was dispatched and patient returned to the hospital.  On arrival to ED, vitals were stable, patient was sitting in chair chair when EMS arrived.     Review of Systems: Review of Systems  All other systems reviewed and are negative.   Past Medical History:  Diagnosis Date   Anemia    Arthritis    Back pain    Bronchitis    Chronic kidney disease    dialysis on tues, thurs, sat   COPD (chronic obstructive pulmonary disease) (HCC)    Coronary  artery disease    COVID    mild - flu like symptoms   Dyspnea    w/ exertion, uses inhaler   GERD (gastroesophageal reflux disease)    not a current problem   Heart murmur    never has caused any problems   Hypertension    Myocardial infarction (HCC)    Pneumonia    x 1   Pre-diabetes    diet controlled, no meds, does not check blood sugar   Substance abuse (HCC)     Social History   Tobacco Use   Smoking status: Every Day    Packs/day: .5     Types: Cigars, Cigarettes    Last attempt to quit: 2019    Years since quitting: 5.3    Passive exposure: Past   Smokeless tobacco: Never   Tobacco comments:    1-2 per day -  black and mild cigars  Vaping Use   Vaping Use: Never used  Substance Use Topics   Alcohol use: Yes    Alcohol/week: 14.0 standard drinks of alcohol    Types: 14 Cans of beer per week    Comment: Daily one-two beers per day   Drug use: Yes    Frequency: 1.0 times per week    Types: Marijuana, Cocaine    Comment: marijuana occasional, cocaine - no longer using per pt 4/18/24once a week (11/19/22)    Family History  Problem Relation Age of Onset   Heart disease Mother    Heart attack Sister 29   Scheduled Meds:  aspirin EC  81 mg Oral Daily   Chlorhexidine Gluconate Cloth  6 each Topical Q0600   heparin  5,000 Units Subcutaneous Q8H   isosorbide mononitrate  30 mg Oral Daily   labetalol  100 mg Oral BID   multivitamin  1 tablet Oral QHS   pantoprazole  40 mg Oral Daily   tamsulosin  0.4 mg Oral BID   Continuous Infusions:  ampicillin (OMNIPEN) IV     cefTRIAXone (ROCEPHIN)  IV     PRN Meds:.acetaminophen **OR** acetaminophen, alteplase, fentaNYL (SUBLIMAZE) injection, heparin, hydrALAZINE, methocarbamol, ondansetron **OR** ondansetron (ZOFRAN) IV, oxyCODONE, polyethylene glycol, senna-docusate No Known Allergies  OBJECTIVE: Blood pressure 121/82, pulse 70, temperature 98.3 F (36.8 C), temperature source Oral, resp. rate (!) 22, height 5\' 10"  (1.778 m), weight 62.2 kg, SpO2 99 %.  Physical Exam Constitutional:      General: He is not in acute distress.    Appearance: He is normal weight. He is not toxic-appearing.  HENT:     Head: Normocephalic and atraumatic.     Right Ear: External ear normal.     Left Ear: External ear normal.     Nose: No congestion or rhinorrhea.     Mouth/Throat:     Mouth: Mucous membranes are moist.     Pharynx: Oropharynx is clear.  Eyes:     Extraocular  Movements: Extraocular movements intact.     Conjunctiva/sclera: Conjunctivae normal.     Pupils: Pupils are equal, round, and reactive to light.  Cardiovascular:     Rate and Rhythm: Normal rate and regular rhythm.     Heart sounds: No murmur heard.    No friction rub. No gallop.  Pulmonary:     Effort: Pulmonary effort is normal.     Breath sounds: Normal breath sounds.  Abdominal:     General: Abdomen is flat. Bowel sounds are normal.     Palpations: Abdomen is soft.  Musculoskeletal:  General: No swelling. Normal range of motion.     Cervical back: Normal range of motion and neck supple.  Skin:    General: Skin is warm and dry.  Neurological:     General: No focal deficit present.     Mental Status: He is oriented to person, place, and time.  Psychiatric:        Mood and Affect: Mood normal.     Lab Results Lab Results  Component Value Date   WBC 7.3 12/19/2022   HGB 13.7 12/19/2022   HCT 42.8 12/19/2022   MCV 84.8 12/19/2022   PLT 123 (L) 12/19/2022    Lab Results  Component Value Date   CREATININE 4.99 (H) 12/19/2022   BUN 27 (H) 12/19/2022   NA 137 12/19/2022   K 3.5 12/19/2022   CL 95 (L) 12/19/2022   CO2 25 12/19/2022    Lab Results  Component Value Date   ALT 12 12/18/2022   AST 16 12/18/2022   ALKPHOS 54 12/18/2022   BILITOT 0.5 12/18/2022       Danelle Earthly, MD [facility] 12/19/2022, 11:39 AM   I have personally spent 85 minutes involved in face-to-face and non-face-to-face activities for this patient on the day of the visit. Professional time spent includes the following activities: Preparing to see the patient (review of tests), Obtaining and/or reviewing separately obtained history (admission/discharge record), Performing a medically appropriate examination and/or evaluation , Ordering medications/tests/procedures, referring and communicating with other health care professionals,  Documenting clinical information in the EMR, Independently interpreting results (not separately reported), Communicating results to the patient/family/caregiver, Counseling and educating the patient/family/caregiver and Care coordination (not separately reported).

## 2022-12-19 NOTE — Evaluation (Signed)
Physical Therapy Evaluation Patient Details Name: Ronald Reeves MRN: [id number] DOB: [birth year] Today's Date: 12/19/2022  History of Present Illness  Pt is a 67 y.o. male presented [facility] hospital on 12/16/2022 with complaints of severe back pain, dyspnea and cough and was admitted. Pt left AMA on 12/18/22. Pt readmitted later the same day after presenting to ER with episode of dizziness and lightheadedness upon arrival to HD. Pt reports multiple falls recently. Workup concerning for CHF exacerbation and CAP. PMH includes ESRD, HFrEF, polysubstance abuse, HTN, GERD, BPH.  Clinical Impression  Pt presents today with a history of falls at home, tolerating today's session well and motivated to mobilize but limited by drop in BP and lightheadedness with changes in position. Pt reports drop in BP while seated EOB, reports it was happening at home as well and with anytime he stands up. Pt noted slight drop in BP while seated EOB but with continued sitting and performing TherEx to try and increase BP, it remained low at 97/49 with pt symptomatic. BP improving in supine and after performing ankle pumps and heel slides, pt able to tolerate brief transfer to the chair, once legs reclined BP 118/61 and pt reporting no symptoms. Pt standing and transferring with SPC and minG for safety, noted mild imbalance with standing but pt able to self correct. Acute PT will continue to follow pt during admission to progress mobility as BP allows. Currently recommending HHPT upon discharge for safety with mobility if pt remains lightheaded. Will follow as appropriate.      Recommendations for follow up therapy are one component of a multi-disciplinary discharge planning process, led by the attending physician.  Recommendations may be updated based on patient status, additional functional criteria and insurance authorization.  Follow Up Recommendations       Assistance Recommended at Discharge Intermittent Supervision/Assistance   Patient can return home with the following  A little help with walking and/or transfers;Help with stairs or ramp for entrance    Equipment Recommendations None recommended by PT  Recommendations for Other Services       Functional Status Assessment Patient has had a recent decline in their functional status and demonstrates the ability to make significant improvements in function in a reasonable and predictable amount of time.     Precautions / Restrictions Precautions Precautions: Fall;Other (comment) Precaution Comments: orthostatic Restrictions Weight Bearing Restrictions: No      Mobility  Bed Mobility Overal bed mobility: Needs Assistance Bed Mobility: Supine to Sit, Sit to Supine     Supine to sit: Supervision, HOB elevated Sit to supine: Supervision, HOB elevated        Transfers Overall transfer level: Needs assistance Equipment used: Straight cane Transfers: Sit to/from Stand, Bed to chair/wheelchair/BSC Sit to Stand: Min guard   Step pivot transfers: Min guard       General transfer comment: minG for safety and balance, pt taking 1 step over to the recliner, cueing for controlled descent into chair. Close guard due to being orthostatic earlier    Ambulation/Gait               General Gait Details: deferred as BP dropping while seated EOB and pt symptomatic  Stairs            Wheelchair Mobility    Modified Rankin (Stroke Patients Only)       Balance Overall balance assessment: Needs assistance Sitting-balance support: No upper extremity supported, Feet supported Sitting balance-Leahy Scale: Good     Standing balance  support: Single extremity supported, During functional activity, Reliant on assistive device for balance Standing balance-Leahy Scale: Poor Standing balance comment: reliant on SPC during mobility                             Pertinent Vitals/Pain Pain Assessment Pain Assessment: Faces Faces Pain  Scale: No hurt    Home Living Family/patient expects to be discharged to:: Private residence Living Arrangements: Spouse/significant other Available Help at Discharge: Family;Available 24 hours/day Type of Home: House Home Access: Stairs to enter Entrance Stairs-Rails: None Entrance Stairs-Number of Steps: 3   Home Layout: One level Home Equipment: Rollator (4 wheels);Cane - single point;Shower seat      Prior Function Prior Level of Function : Independent/Modified Independent;Driving;History of Falls (last six months)             Mobility Comments: ambulates with SPC recently, reports falls due to lightheadedness ADLs Comments: Pt reports he is Independent to Mod I with ADLs at baseline. Pt reports wife has assisted with LB dressing recently due to dizziness and lightheadedness when bending forward.     Hand Dominance   Dominant Hand: Right    Extremity/Trunk Assessment   Upper Extremity Assessment Upper Extremity Assessment: Defer to OT evaluation    Lower Extremity Assessment Lower Extremity Assessment: Generalized weakness    Cervical / Trunk Assessment Cervical / Trunk Assessment: Kyphotic  Communication   Communication: No difficulties  Cognition Arousal/Alertness: Awake/alert Behavior During Therapy: WFL for tasks assessed/performed Overall Cognitive Status: Within Functional Limits for tasks assessed                                          General Comments General comments (skin integrity, edema, etc.): BP in bed 120/59, while seated EOB 108/58. Pt performed marches and shoulder flexion while seated EOB but pt reporting lightheadedness getting worse BP 97/49, improving to 130/62 once back in bed. Pt performed ankle pumps and heel slides to prepare for transfer to chair, BP once seated in chair and reclined 118/61    Exercises     Assessment/Plan    PT Assessment Patient needs continued PT services  PT Problem List Decreased  strength;Decreased activity tolerance;Decreased balance;Decreased mobility;Decreased coordination;Cardiopulmonary status limiting activity;Decreased knowledge of use of DME;Decreased safety awareness;Decreased knowledge of precautions       PT Treatment Interventions DME instruction;Gait training;Stair training;Functional mobility training;Therapeutic activities;Therapeutic exercise;Balance training;Neuromuscular re-education;Patient/family education    PT Goals (Current goals can be found in the Care Plan section)  Acute Rehab PT Goals Patient Stated Goal: to go home PT Goal Formulation: With patient Time For Goal Achievement: 01/02/23 Potential to Achieve Goals: Good    Frequency Min 3X/week     Co-evaluation               AM-PAC PT "6 Clicks" Mobility  Outcome Measure Help needed turning from your back to your side while in a flat bed without using bedrails?: None Help needed moving from lying on your back to sitting on the side of a flat bed without using bedrails?: None Help needed moving to and from a bed to a chair (including a wheelchair)?: A Little Help needed standing up from a chair using your arms (e.g., wheelchair or bedside chair)?: A Little Help needed to walk in hospital room?: A Lot Help needed climbing 3-5 steps with  a railing? : A Lot 6 Click Score: 18    End of Session Equipment Utilized During Treatment: Gait belt Activity Tolerance: Patient tolerated treatment well Patient left: in chair;with call bell/phone within reach;with chair alarm set Nurse Communication: Mobility status PT Visit Diagnosis: Other abnormalities of gait and mobility (R26.89);Muscle weakness (generalized) (M62.81);History of falling (Z91.81)    Time: 6045-4098 PT Time Calculation (min) (ACUTE ONLY): 21 min   Charges:   PT Evaluation $PT Eval Moderate Complexity: 1 Mod          Lindalou Hose, PT DPT Acute Rehabilitation Services Office [phone]   Leonie Man 12/19/2022, 4:57 PM

## 2022-12-19 NOTE — Plan of Care (Signed)
  Problem: Education: Goal: Knowledge of General Education information will improve Description Including pain rating scale, medication(s)/side effects and non-pharmacologic comfort measures Outcome: Progressing   

## 2022-12-19 NOTE — Progress Notes (Addendum)
[facility] Progress Note   OP Dialysis Orders: Center: [facility]  on TTS. 180NRe 4 hours BFR 350 DFR A1.5 EDW 64kg 2K 3Ca  LIJ was inserted on 09/08/22 and then exchanged 09/24/22 Heparin 5000 unit bolus pre-HD and 2500 unit bolus mid HD Left 0.6kg above EDW, past 3 treatments were shortened   Calcitriol PO q HD  Assessment/Plan:  Acute on chronic CHF: CXR with possible pneumonia via CHF. Has been shortening HD due to back pain. BNP also elevated, high suspicion for volume overload. Shortened HD yesterday but respiratory status is improved. Further UF with HD today. Will need a lower EDW at discharge.  Enterococcus bacteremia: Initially concern for pneumonia, patient now also reporting dysuria. Checking a urine culture. ID onboard and needs a line holiday -> requested VIR to remove the LIJ TC. Back pain: Acute on chronic, management per admitting team. Not reporting any pain this AM.  ESRD:  On TTS schedule but has been shortening treatments, see above. HD 5/9 2L net UF. Plan next HD early next week when a new catheter has been placed. Hypertension/volume: BP controlled, volume overloaded as above. On several BP meds outpatient, resume PRN  Anemia: Hgb 13.9, no ESA indicated.   Metabolic bone disease: Corrected calcium at goal. Continue VDRA and phos binder (recently switched to [place])  Dialysis access: Using AVF, had right first stage basilic vein transposition on 11/28/22. Has not been used again yet, needs second stage surgery. Needs line holiday, see above. ID is on board.   Subjective:   Blood cultures from 12/16/22 positive for enterococcus. On further questioning he does report some burning with urination for a few days. Reports itching around [facility] chronically but no redness or drainage. No fever or chills.   U/A not suggestive of UTI.  Objective Vitals:   12/19/22 0000 12/19/22 0200 12/19/22 0400 12/19/22 0817  BP: 107/64  (!) 114/55 129/74  Pulse: 78 (!) 54  70   Resp: 15 13  (!) 22  Temp:    98.3 F (36.8 C)  TempSrc:    Oral  SpO2: 99% 98%  99%  Weight:      Height:       Physical Exam General: Alert male, supine in bed Heart: RRR, no murmurs, rubs or gallops Lungs: CTA bilaterally Abdomen: Soft, non-distended, +BS Extremities: No edema b/l lower extremities Dialysis Access: TDC, maturing RUE BBF (nontransposed) +bruit  Additional Objective Labs: Basic Metabolic Panel: Recent Labs  Lab 12/17/22 0553 12/18/22 1041 12/18/22 1450 12/19/22 0407  NA 135 133* 133* 137  K 4.6 3.9 3.7 3.5  CL 97* 96* 94* 95*  CO2 23 24 25 25   GLUCOSE 137* 107* 107* 125*  BUN 31* 38* 41* 27*  CREATININE 6.42* 6.31* 6.68* 4.99*  CALCIUM 9.4 8.7* 8.8* 9.0  PHOS 4.3 4.8*  --  4.5   Liver Function Tests: Recent Labs  Lab 12/16/22 2337 12/17/22 0553 12/18/22 1041 12/18/22 1450 12/19/22 0407  AST 20 20  --  16  --   ALT 14 16  --  12  --   ALKPHOS 52 60  --  54  --   BILITOT 0.8 0.8  --  0.5  --   PROT 6.5 6.9  --  6.9  --   ALBUMIN 2.9* 3.1* 2.7* 2.9* 2.8*   No results for input(s): "LIPASE", "AMYLASE" in the last 168 hours. CBC: Recent Labs  Lab 12/16/22 2337 12/17/22 0553 12/18/22 1041 12/18/22 1450 12/19/22 0407  WBC  8.6 10.0 7.7 8.6 7.3  NEUTROABS 6.1 7.4  --  5.8  --   HGB 13.4 13.9 13.4 13.6 13.7  HCT 42.2 41.3 40.5 41.3 42.8  MCV 86.5 83.1 83.0 84.6 84.8  PLT 129* 126* 119* 121* 123*   Blood Culture    Component Value Date/Time   SDES BLOOD SITE NOT SPECIFIED 12/17/2022 2101   SDES BLOOD SITE NOT SPECIFIED 12/17/2022 2101   SPECREQUEST  12/17/2022 2101    BOTTLES DRAWN AEROBIC AND ANAEROBIC Blood Culture results may not be optimal due to an excessive volume of blood received in culture bottles   SPECREQUEST  12/17/2022 2101    BOTTLES DRAWN AEROBIC AND ANAEROBIC Blood Culture results may not be optimal due to an excessive volume of blood received in culture bottles   CULT  12/17/2022 2101    NO GROWTH 2 DAYS Performed  at [facility], [address], [place], [place] [address]    CULT  12/17/2022 2101    NO GROWTH 2 DAYS Performed at [facility], [address] [place], [place] [address]    REPTSTATUS PENDING 12/17/2022 2101   REPTSTATUS PENDING 12/17/2022 2101    Cardiac Enzymes: No results for input(s): "CKTOTAL", "CKMB", "CKMBINDEX", "TROPONINI" in the last 168 hours. CBG: No results for input(s): "GLUCAP" in the last 168 hours. Iron Studies: No results for input(s): "IRON", "TIBC", "TRANSFERRIN", "FERRITIN" in the last 72 hours. @lablastinr3 @ Studies/Results: No results found. Medications:  gentamicin Stopped (12/18/22 2122)   vancomycin Stopped (12/18/22 2040)    aspirin EC  81 mg Oral Daily   Chlorhexidine Gluconate Cloth  6 each Topical Q0600   heparin  5,000 Units Subcutaneous Q8H   isosorbide mononitrate  30 mg Oral Daily   labetalol  100 mg Oral BID   multivitamin  1 tablet Oral QHS   pantoprazole  40 mg Oral Daily   tamsulosin  0.4 mg Oral BID

## 2022-12-20 LAB — RENAL FUNCTION PANEL
Albumin: 2.6 g/dL — ABNORMAL LOW (ref 3.5–5.0)
Anion gap: 16 — ABNORMAL HIGH (ref 5–15)
BUN: 51 mg/dL — ABNORMAL HIGH (ref 8–23)
CO2: 23 mmol/L (ref 22–32)
Calcium: 9 mg/dL (ref 8.9–10.3)
Chloride: 97 mmol/L — ABNORMAL LOW (ref 98–111)
Creatinine, Ser: 7.31 mg/dL — ABNORMAL HIGH (ref 0.61–1.24)
GFR, Estimated: 8 mL/min — ABNORMAL LOW (ref >60)
Glucose, Bld: 103 mg/dL — ABNORMAL HIGH (ref 70–99)
Phosphorus: 5.3 mg/dL — ABNORMAL HIGH (ref 2.5–4.6)
Potassium: 3.7 mmol/L (ref 3.5–5.1)
Sodium: 136 mmol/L (ref 135–145)

## 2022-12-20 LAB — CBC
HCT: 39 % (ref 39.0–52.0)
Hemoglobin: 12.9 g/dL — ABNORMAL LOW (ref 13.0–17.0)
MCH: 27.2 pg (ref 26.0–34.0)
MCHC: 33.1 g/dL (ref 30.0–36.0)
MCV: 82.3 fL (ref 80.0–100.0)
Platelets: 150 10*3/uL (ref 150–400)
RBC: 4.74 MIL/uL (ref 4.22–5.81)
RDW: 16.5 % — ABNORMAL HIGH (ref 11.5–15.5)
WBC: 7.3 10*3/uL (ref 4.0–10.5)
nRBC: 0 % (ref 0.0–0.2)

## 2022-12-20 LAB — CULTURE, BLOOD (ROUTINE X 2): Culture: NO GROWTH

## 2022-12-20 NOTE — Progress Notes (Signed)
Pt decided to leave AMA today, after attending physician, Dr. Jeoffrey Massed, MD, explained to pt the risks involved leaving AMA. This Clinical research associate notified charge Teacher, music. This Clinical research associate had pt sign the AMA form, and placed in pt's chart. Pt left unit in NAD, with PIV removed and documented.

## 2022-12-20 NOTE — Plan of Care (Signed)

## 2022-12-20 NOTE — Plan of Care (Addendum)
[facility] Patient Discharge Orders- [facility] CLINIC: [facility]  Patient's name: El Pensinger Admit/DC Dates: 12/18/2022 - 12/20/2022 - LEFT AMA AGAIN  Discharge Diagnoses: Enterococcal bacteremia - s/p TDC removal 5/10 - he has no dialysis access at this time. He has an appt at [facility] on Tues 5/14 at 8:30am to have new TDC placed.  Medical non-compliance  Aranesp: Given: no  Last Hgb: 12.9 PRBC's Given: no ESA dose for discharge: none IV Iron dose at discharge: none  Heparin change: no  EDW Change: yes New EDW: 62kg  [place] Change: no  Access intervention/Change: YES - TDC REMOVED 5/10 FOR LINE HOLIDAY. HE HAS NO FUNCTIONING ACCESS AT THIS TIME  Hectorol/Calcitriol change: NO  Discharge Labs: Calcium 9 Phosphorus 5.3 Albumin 2.6 K+ 3.7  IV Antibiotics: VANCOMYCIN 750mg  IV Q HD Details:  On Coumadin?: no Last INR: Next INR: Managed By:   OTHER/APPTS/LAB ORDERS:    D/C Meds to be reconciled by nurse after every discharge.  Completed By: Ozzie Hoyle, PA-C [facility] Pager [phone]  Reviewed by: MD:______ RN_______

## 2022-12-20 NOTE — Progress Notes (Signed)
                                                         AMA  Patient at this time expresses desire to leave the Hospital immidiately, patient has been warned that this is not Medically advisable at this time, and can result in Medical complications like Death and Disability, patient understands and accepts the risks involved and assumes full responsibilty of this decision.   Jeoffrey Massed M.D on 12/20/2022 at 8:37 AM

## 2022-12-20 NOTE — Discharge Summary (Signed)
PATIENT DETAILS Name: Ronald Reeves Age: 67 y.o. Sex: male Date of Birth: Feb 02, 1956 MRN: [id number]. Admitting Physician: Almon Hercules, MD [id number], Rosalita Chessman, MD  Admit Date: 12/18/2022 Discharge date: 12/20/2022  Note:patient left AMA  Recommendations for Outpatient Follow-up:  Has enterococcal bacteremia-if he presents to the ED-probably needs to be admitted or have ID/nephrology consultation.  See discussion below.  PRIMARY DISCHARGE DIAGNOSIS:  Principal Problem:   Enterococcal bacteremia Active Problems:   Cocaine dependence, continuous (HCC)   ESRD (end stage renal disease) (HCC)   Essential hypertension, malignant   Chronic back pain   Benign prostatic hyperplasia   Chronic HFrEF (heart failure with reduced ejection fraction) (HCC)      PAST MEDICAL HISTORY: Past Medical History:  Diagnosis Date   Anemia    Arthritis    Back pain    Bronchitis    Chronic kidney disease    dialysis on tues, thurs, sat   COPD (chronic obstructive pulmonary disease) (HCC)    Coronary artery disease    COVID    mild - flu like symptoms   Dyspnea    w/ exertion, uses inhaler   GERD (gastroesophageal reflux disease)    not a current problem   Heart murmur    never has caused any problems   Hypertension    Myocardial infarction (HCC)    Pneumonia    x 1   Pre-diabetes    diet controlled, no meds, does not check blood sugar   Substance abuse (HCC)     ALLERGIES:  No Known Allergies  BRIEF SUMMARY: Patient is a 67 y.o.  male with history of ESRD on HD TTS, chronic HFrEF, HTN, BPH, polysubstance abuse-who recently left the hospital AGAINST MEDICAL ADVICE on 5/9 after being admitted for CHF exacerbation/enterococcal bacteremia-presented back to the ED on the same day and was subsequently admitted to [facility].     Significant events: 5/7-5/9>> hospitalized at [facility] for HFrEF exacerbation-blood cultures positive for Enterococcus-signed out AMA. 5/9>> back to ED-readmit  to [facility]  5/10>> HD catheter removed 5/11>> signed out AMA.   Significant studies: 5/7>> CXR: No acute process. 5/7>> x-ray left hip/pelvis: Bilateral hip osteoarthritis. 5/8>> CT L-spine: No fracture/subluxation 5/8>> CT angio chest/abdomen/pelvis: No aortic dissection/aneurysm.   Significant microbiology data: 5/7>> blood culture: Enterococcus 5/8>> blood culture: No growth   Procedures: 5/10>> mild dialysis catheter removed by IR   Consults: Renal IR ID   BRIEF HOSPITAL COURSE:  Enterococcal bacteremia Afebrile-nontoxic-appearing Likely from HD catheter related infection On IV to biotics per infectious disease HD catheter removed on 5/10 Plans were for catheter holiday over the weekend-continue IV antibiotics-and then pursue TEE/HD catheter reinsertion early next week.  Unfortunately patient not willing to remain inpatient-and was already dressed this morning and wanting to leave.  This MD explained rationale for continued hospitalization, benefits of continued hospitalization-and risks of leaving AGAINST MEDICAL ADVICE before workup/treatment plan could be completed.  He understood the life-threatening/life disabling risk of leaving AMA. Did manage discussed with nephrologist-Dr. Juel Burrow regarding patient's planned to leave AMA.  Patient claims that he will follow-up with vascular clinic at his nephrologist office for HD catheter reinsertion Tuesday.    ESRD on HD TTS Nephrology followed closely. Per prior notes-has right upper extremity fistula-which is not yet ready for use.   Chronic HFrEF Euvolemic Volume removal with HD   CAD No anginal symptoms Remains on aspirin/Imdur/beta-blocker   HTN BP stable Remains on Imdur/beta-blocker   GERD PPI   BPH Apparently still makes  urine Flomax   Chronic back pain-s/p L3-4 fixation and L4-5 laminectomy. Per prior notes-as associated left leg weakness that is chronic Was on as needed narcotics/Robaxin   Polysubstance  abuse (cocaine/tobacco) Counseled   Underweight: Estimated body mass index is 19.68 kg/m as calculated from the following:   Height as of this encounter: 5\' 10"  (1.778 m).   Weight as of this encounter: 62.2 kg.   PERTINENT RADIOLOGIC STUDIES: ECHOCARDIOGRAM COMPLETE  Result Date: 12/19/2022    ECHOCARDIOGRAM REPORT   Patient Name:   Ronald Reeves Date of Exam: 12/19/2022 Medical Rec #:  [id number]  Height:       70.0 in Accession #:    [id number] Weight:       137.1 lb Date of Birth:  November 21, 1955  BSA:          1.778 m Patient Age:    67 years   BP:           129/57 mmHg Patient Gender: M          HR:           55 bpm. Exam Location:  Inpatient Procedure: 2D Echo, Cardiac Doppler and Color Doppler Indications:     Bacteremia  History:         Patient has prior history of Echocardiogram examinations, most                  recent 08/21/2017. Risk Factors:Hypertension.  Sonographer:     Lucy Antigua Referring Phys:  Almon Hercules Diagnosing Phys: Ronald Lesches MD IMPRESSIONS  1. Left ventricular ejection fraction, by estimation, is 30 to 35%. The left ventricle has moderately decreased function. The left ventricle demonstrates global hypokinesis. There is mild left ventricular hypertrophy. Left ventricular diastolic parameters are consistent with Grade II diastolic dysfunction (pseudonormalization). Elevated left atrial pressure.  2. Right ventricular systolic function is mildly reduced. The right ventricular size is mildly enlarged. Tricuspid regurgitation signal is inadequate for assessing PA pressure.  3. Left atrial size was severely dilated.  4. The mitral valve is normal in structure. No evidence of mitral valve regurgitation. No evidence of mitral stenosis.  5. The aortic valve is tricuspid. Aortic valve regurgitation is trivial. No aortic stenosis is present.  6. The inferior vena cava is normal in size with greater than 50% respiratory variability, suggesting right atrial pressure of 3 mmHg.  Conclusion(s)/Recommendation(s): No evidence of valvular vegetations on this transthoracic echocardiogram. Consider a transesophageal echocardiogram to exclude infective endocarditis if clinically indicated. FINDINGS  Left Ventricle: Left ventricular ejection fraction, by estimation, is 30 to 35%. The left ventricle has moderately decreased function. The left ventricle demonstrates global hypokinesis. The left ventricular internal cavity size was normal in size. There is mild left ventricular hypertrophy. Left ventricular diastolic parameters are consistent with Grade II diastolic dysfunction (pseudonormalization). Elevated left atrial pressure. Right Ventricle: The right ventricular size is mildly enlarged. No increase in right ventricular wall thickness. Right ventricular systolic function is mildly reduced. Tricuspid regurgitation signal is inadequate for assessing PA pressure. Left Atrium: Left atrial size was severely dilated. Right Atrium: Right atrial size was normal in size. Pericardium: There is no evidence of pericardial effusion. Mitral Valve: The mitral valve is normal in structure. No evidence of mitral valve regurgitation. No evidence of mitral valve stenosis. Tricuspid Valve: The tricuspid valve is normal in structure. Tricuspid valve regurgitation is trivial. Aortic Valve: The aortic valve is tricuspid. Aortic valve regurgitation is trivial. No aortic stenosis is present. Aortic  valve mean gradient measures 4.0 mmHg. Aortic valve peak gradient measures 7.7 mmHg. Aortic valve area, by VTI measures 3.98 cm. Pulmonic Valve: The pulmonic valve was not well visualized. Pulmonic valve regurgitation is trivial. Aorta: The aortic root is normal in size and structure. Venous: The inferior vena cava is normal in size with greater than 50% respiratory variability, suggesting right atrial pressure of 3 mmHg. IAS/Shunts: The interatrial septum was not well visualized.  LEFT VENTRICLE PLAX 2D LVIDd:         4.90  cm      Diastology LVIDs:         4.30 cm      LV e' medial:    3.15 cm/s LV PW:         1.30 cm      LV E/e' medial:  25.6 LV IVS:        1.20 cm      LV e' lateral:   8.38 cm/s LVOT diam:     2.50 cm      LV E/e' lateral: 9.6 LV SV:         105 LV SV Index:   59 LVOT Area:     4.91 cm  LV Volumes (MOD) LV vol d, MOD A4C: 162.0 ml LV vol s, MOD A4C: 103.0 ml LV SV MOD A4C:     162.0 ml RIGHT VENTRICLE RV S prime:     9.36 cm/s TAPSE (M-mode): 1.6 cm LEFT ATRIUM              Index        RIGHT ATRIUM           Index LA Vol (A2C):   115.0 ml 64.68 ml/m  RA Area:     16.20 cm LA Vol (A4C):   74.3 ml  41.79 ml/m  RA Volume:   43.20 ml  24.30 ml/m LA Biplane Vol: 97.6 ml  54.89 ml/m  AORTIC VALVE AV Area (Vmax):    3.64 cm AV Area (Vmean):   3.64 cm AV Area (VTI):     3.98 cm AV Vmax:           139.00 cm/s AV Vmean:          91.900 cm/s AV VTI:            0.264 m AV Peak Grad:      7.7 mmHg AV Mean Grad:      4.0 mmHg LVOT Vmax:         103.00 cm/s LVOT Vmean:        68.100 cm/s LVOT VTI:          0.214 m LVOT/AV VTI ratio: 0.81 MITRAL VALVE MV Area (PHT): 3.27 cm     SHUNTS MV Decel Time: 232 msec     Systemic VTI:  0.21 m MV E velocity: 80.60 cm/s   Systemic Diam: 2.50 cm MV A velocity: 101.00 cm/s MV E/A ratio:  0.80 Ronald Lesches MD Electronically signed by Ronald Lesches MD Signature Date/Time: 12/19/2022/4:28:40 PM    Final (Updated)    IR Removal Tun Cv Cath W/O FL  Result Date: 12/19/2022 INDICATION: 67 year old male. Admitted found to have bacteremia. Team is requesting left-sided tunneled dialysis catheter removal for line holiday EXAM: REMOVAL TUNNELED CENTRAL VENOUS CATHETER MEDICATIONS: No antibiotic was indicated for this procedure. Moderate (conscious) sedation was not employed during this procedure. FLUOROSCOPY TIME:  None COMPLICATIONS: None immediate. PROCEDURE: Informed written consent was obtained from the patient  after a thorough discussion of the procedural risks, benefits  and alternatives. All questions were addressed. Maximal Sterile Barrier Technique was utilized including mask, sterile gowns, sterile gloves, sterile drape, hand hygiene and skin antiseptic. A timeout was performed prior to the initiation of the procedure. The patient's left chest and catheter was prepped and draped in a normal sterile fashion. Heparin was removed from both ports of catheter. 1% lidocaine was used for local anesthesia. Using gentle blunt dissection the cuff of the catheter was exposed and the catheter was removed in it's entirety. Pressure was held till hemostasis was obtained. A sterile dressing was applied. The patient tolerated the procedure well with no immediate complications. IMPRESSION: Successful catheter removal as described above. Read by: Anders Grant, NP Electronically Signed   By: Olive Bass M.D.   On: 12/19/2022 11:40   CT L-SPINE NO CHARGE  Result Date: 12/17/2022 CLINICAL DATA:  Back pain EXAM: CT LUMBAR SPINE WITHOUT CONTRAST TECHNIQUE: Multidetector CT imaging of the lumbar spine was performed without intravenous contrast administration. Multiplanar CT image reconstructions were also generated. RADIATION DOSE REDUCTION: This exam was performed according to the departmental dose-optimization program which includes automated exposure control, adjustment of the mA and/or kV according to patient size and/or use of iterative reconstruction technique. COMPARISON:  Lumbar spine radiographs 12/06/2022 FINDINGS: Segmentation: 5 lumbar type vertebrae. Alignment: First degree anterolisthesis of L3 on L4.  Unchanged Vertebrae: The vertebral body heights are well preserved. No signs of acute fracture or subluxation. Status post bilateral pedicle screw and posterior rod fixation of L3-4. L4-5 laminectomy. Paraspinal and other soft tissues: Negative. Disc levels: Solid fusion of the L3-4 disc space. Large flowing ventral osteophytes identified at the remaining lumbar spine levels  with relative preservation of the disc spaces. IMPRESSION: 1. No acute fracture or subluxation. 2. Status post posterior hardware fixation of L3-4 and laminectomy at L4-5. Marland Kitchen Electronically Signed   By: Signa Kell M.D.   On: 12/17/2022 05:19   CT Angio Chest/Abd/Pel for Dissection W and/or W/WO  Result Date: 12/17/2022 CLINICAL DATA:  Severe back pain. EXAM: CT ANGIOGRAPHY CHEST, ABDOMEN AND PELVIS TECHNIQUE: Non-contrast CT of the chest was initially obtained. Multidetector CT imaging through the chest, abdomen and pelvis was performed using the standard protocol during bolus administration of intravenous contrast. Multiplanar reconstructed images and MIPs were obtained and reviewed to evaluate the vascular anatomy. RADIATION DOSE REDUCTION: This exam was performed according to the departmental dose-optimization program which includes automated exposure control, adjustment of the mA and/or kV according to patient size and/or use of iterative reconstruction technique. CONTRAST:  OMNIPAQUE IOHEXOL 350 MG/ML SOLN COMPARISON:  Chest radiograph dated 12/16/2022 and CT abdomen pelvis dated 08/05/2022. FINDINGS: Evaluation of this exam is limited due to respiratory motion artifact as well as streak artifact caused by spinal hardware.oh CTA CHEST FINDINGS Cardiovascular: There is mild cardiomegaly. No pericardial effusion. There is coronary vascular calcification primarily involving the LAD. Left IJ central venous line with tip at the junction of the right atrium and IVC. There is mild atherosclerotic calcification of the thoracic aorta. No aneurysmal dilatation or dissection. The origins of the great vessels of the aortic arch appear patent. No pulmonary artery embolus identified. Mediastinum/Nodes: No hilar or mediastinal adenopathy. Mild thickened appearance of the esophagus. Clinical correlation is recommended to evaluate for possibility of esophagitis. Small amount of residual fluid and ingested content  noted in the esophagus. No mediastinal fluid collection. Lungs/Pleura: There is mild paraseptal emphysema. Faint bibasilar and lower lobe  hazy and ground-glass densities concerning for edema. Atypical pneumonia is not excluded. Clinical correlation is recommended. No focal consolidation, pleural effusion, or pneumothorax. Linear atelectasis/scarring at the left lung base. The central airways are patent. Musculoskeletal: Degenerative changes of the spine. No acute osseous pathology. Review of the MIP images confirms the above findings. CTA ABDOMEN AND PELVIS FINDINGS VASCULAR Aorta: Mild atherosclerotic calcification. No aneurysmal dilatation or dissection. Celiac: Patent without evidence of aneurysm, dissection, vasculitis or significant stenosis. SMA: Atherosclerotic calcification of the SMA. The SMA remains patent. Renals: There is atherosclerotic calcification of the origin of the right renal artery. The renal arteries remain patent however, with diminished flow. IMA: The IMA is patent. Inflow: Mild atherosclerotic calcification of the iliac arteries. The iliac arteries are patent. No aneurysmal dilatation or dissection. Veins: The IVC is unremarkable.  No fullness gas. Review of the MIP images confirms the above findings. NON-VASCULAR No intra-abdominal free air.  Small free fluid within the pelvis. Hepatobiliary: The liver is unremarkable. No biliary dilatation. The gallbladder is unremarkable. Pancreas: The pancreas is grossly unremarkable. Spleen: Normal in size without focal abnormality. Adrenals/Urinary Tract: Probable right adrenal thickening/hyperplasia. The left adrenal gland is poorly visualized. There is moderate bilateral renal parenchyma atrophy. Small left renal interpolar cyst. There is no hydronephrosis on either side. The urinary bladder is grossly unremarkable. Stomach/Bowel: There is sigmoid diverticulosis without active inflammatory changes. There is no bowel obstruction or active  inflammation. The appendix is unremarkable as visualized. Lymphatic: No adenopathy. Reproductive: The prostate and seminal vesicles are grossly remarkable. Other: None Musculoskeletal: Degenerative changes of the spine. Multilevel laminectomy and posterior fusion. No acute osseous pathology. Review of the MIP images confirms the above findings. IMPRESSION: 1. No aortic dissection or aneurysm. 2. Mild cardiomegaly with coronary vascular calcification primarily involving the LAD. 3. Faint bibasilar and lower lobe hazy and ground-glass densities concerning for edema. Atypical pneumonia is not excluded. 4. Sigmoid diverticulosis without active inflammatory changes. No bowel obstruction. 5. Aortic Atherosclerosis (ICD10-I70.0) and Emphysema (ICD10-J43.9). Electronically Signed   By: Elgie Collard M.D.   On: 12/17/2022 01:28   DG Hip Unilat W or Wo Pelvis 2-3 Views Left  Result Date: 12/16/2022 CLINICAL DATA:  Left hip pain. EXAM: DG HIP (WITH OR WITHOUT PELVIS) 2-3V LEFT COMPARISON:  None Available. FINDINGS: Mild bilateral hip joint space narrowing with acetabular spurring. No fracture. No erosion, evidence of a vascular necrosis or focal bone abnormality. Pubic symphysis and sacroiliac joints are congruent. Postsurgical and degenerative change in the lower lumbar spine, partially included in the field of view. IMPRESSION: Mild bilateral hip osteoarthritis. Electronically Signed   By: Narda Rutherford M.D.   On: 12/16/2022 23:42   DG Chest Port 1 View  Result Date: 12/16/2022 CLINICAL DATA:  Severe upper back pain with cough. EXAM: PORTABLE CHEST 1 VIEW COMPARISON:  Chest x-ray 12/06/2022 FINDINGS: Patient is rotated. Left-sided central venous catheter tip projects over the right atrium, unchanged. The heart is enlarged similar to the prior study. Left lung base has been excluded from the examination. Visualized lungs are clear. There is no definite pleural effusion or pneumothorax. No acute fractures are seen.  IMPRESSION: Cardiomegaly. No acute pulmonary process. Left lung base has been excluded from the examination. Electronically Signed   By: Darliss Cheney M.D.   On: 12/16/2022 22:52   DG Chest 2 View  Result Date: 12/06/2022 CLINICAL DATA:  Fall, near syncope. EXAM: CHEST - 2 VIEW; LUMBAR SPINE - COMPLETE 4+ VIEW COMPARISON:  12/31/2017, 09/20/2022. FINDINGS: Chest:  The heart is enlarged and the mediastinal contour is within normal limits. Atherosclerotic calcification of the aorta is noted. No consolidation, effusion, or pneumothorax. A left internal jugular central venous catheter terminates over the right atrium. Degenerative changes are present in the thoracic spine. No acute osseous abnormality. Lumbar spine: There is no evidence of acute fracture in the lumbar spine. There is mild anterolisthesis at L4-L5. Cervical spinal fusion hardware is noted at L3-L4. Multilevel intervertebral disc space narrowing, endplate osteophyte formation, and facet arthropathy. IMPRESSION: 1. No acute cardiopulmonary process. 2. Cardiomegaly. 3. Multilevel degenerative changes in the cervical spine without evidence of acute fracture. Electronically Signed   By: Thornell Sartorius M.D.   On: 12/06/2022 21:31   DG Lumbar Spine Complete  Result Date: 12/06/2022 CLINICAL DATA:  Fall, near syncope. EXAM: CHEST - 2 VIEW; LUMBAR SPINE - COMPLETE 4+ VIEW COMPARISON:  12/31/2017, 09/20/2022. FINDINGS: Chest: The heart is enlarged and the mediastinal contour is within normal limits. Atherosclerotic calcification of the aorta is noted. No consolidation, effusion, or pneumothorax. A left internal jugular central venous catheter terminates over the right atrium. Degenerative changes are present in the thoracic spine. No acute osseous abnormality. Lumbar spine: There is no evidence of acute fracture in the lumbar spine. There is mild anterolisthesis at L4-L5. Cervical spinal fusion hardware is noted at L3-L4. Multilevel intervertebral disc space  narrowing, endplate osteophyte formation, and facet arthropathy. IMPRESSION: 1. No acute cardiopulmonary process. 2. Cardiomegaly. 3. Multilevel degenerative changes in the cervical spine without evidence of acute fracture. Electronically Signed   By: Thornell Sartorius M.D.   On: 12/06/2022 21:31     PERTINENT LAB RESULTS: CBC: Recent Labs    12/19/22 0407 12/20/22 0301  WBC 7.3 7.3  HGB 13.7 12.9*  HCT 42.8 39.0  PLT 123* 150   CMET CMP     Component Value Date/Time   NA 136 12/20/2022 0301   NA 140 09/22/2017 0856   K 3.7 12/20/2022 0301   CL 97 (L) 12/20/2022 0301   CO2 23 12/20/2022 0301   GLUCOSE 103 (H) 12/20/2022 0301   BUN 51 (H) 12/20/2022 0301   BUN 12 09/22/2017 0856   CREATININE 7.31 (H) 12/20/2022 0301   CREATININE 1.08 09/01/2013 1615   CALCIUM 9.0 12/20/2022 0301   PROT 6.9 12/18/2022 1450   ALBUMIN 2.6 (L) 12/20/2022 0301   AST 16 12/18/2022 1450   ALT 12 12/18/2022 1450   ALKPHOS 54 12/18/2022 1450   BILITOT 0.5 12/18/2022 1450   GFRNONAA 8 (L) 12/20/2022 0301   GFRNONAA 77 09/01/2013 1615   GFRAA 50 (L) 09/22/2017 0856   GFRAA 89 09/01/2013 1615    GFR Estimated Creatinine Clearance: 8.9 mL/min (A) (by C-G formula based on SCr of 7.31 mg/dL (H)). No results for input(s): "LIPASE", "AMYLASE" in the last 72 hours. No results for input(s): "CKTOTAL", "CKMB", "CKMBINDEX", "TROPONINI" in the last 72 hours. Invalid input(s): "POCBNP" No results for input(s): "DDIMER" in the last 72 hours. No results for input(s): "HGBA1C" in the last 72 hours. No results for input(s): "CHOL", "HDL", "LDLCALC", "TRIG", "CHOLHDL", "LDLDIRECT" in the last 72 hours. No results for input(s): "TSH", "T4TOTAL", "T3FREE", "THYROIDAB" in the last 72 hours.  Invalid input(s): "FREET3" No results for input(s): "VITAMINB12", "FOLATE", "FERRITIN", "TIBC", "IRON", "RETICCTPCT" in the last 72 hours. Coags: No results for input(s): "INR" in the last 72 hours.  Invalid input(s):  "PT" Microbiology: Recent Results (from the past 240 hour(s))  Blood culture (routine x 2)  Status: Abnormal   Collection Time: 12/16/22 11:13 PM   Specimen: BLOOD  Result Value Ref Range Status   Specimen Description   Final    BLOOD BLOOD LEFT ARM Performed at [facility], [address] [place], [place] [address]    Special Requests   Final    BOTTLES DRAWN AEROBIC AND ANAEROBIC Blood Culture adequate volume Performed at [facility], [address], [place], [place] [address]    Culture  Setup Time   Final    GRAM POSITIVE COCCI IN BOTH AEROBIC AND ANAEROBIC BOTTLES CRITICAL RESULT CALLED TO, READ BACK BY AND VERIFIED WITH: RN Quenten Raven [phone] @ 1708  FH Performed at [facility], [address], [place], [place] [address]    Culture ENTEROCOCCUS FAECALIS (A)  Final   Report Status 12/19/2022 FINAL  Final   Organism ID, Bacteria ENTEROCOCCUS FAECALIS  Final      Susceptibility   Enterococcus faecalis - MIC*    AMPICILLIN <=2 SENSITIVE Sensitive     VANCOMYCIN 1 SENSITIVE Sensitive     GENTAMICIN SYNERGY RESISTANT Resistant     * ENTEROCOCCUS FAECALIS  Blood Culture ID Panel (Reflexed)     Status: Abnormal   Collection Time: 12/16/22 11:13 PM  Result Value Ref Range Status   Enterococcus faecalis DETECTED (A) NOT DETECTED Final    Comment: CRITICAL RESULT CALLED TO, READ BACK BY AND VERIFIED WITH: RN ITherese Sarah [phone] @ 1708  FH    Enterococcus Faecium NOT DETECTED NOT DETECTED Final   Listeria monocytogenes NOT DETECTED NOT DETECTED Final   Staphylococcus species NOT DETECTED NOT DETECTED Final   Staphylococcus aureus (BCID) NOT DETECTED NOT DETECTED Final   Staphylococcus epidermidis NOT DETECTED NOT DETECTED Final   Staphylococcus lugdunensis NOT DETECTED NOT DETECTED Final   Streptococcus species NOT DETECTED NOT DETECTED Final   Streptococcus agalactiae NOT DETECTED NOT DETECTED Final   Streptococcus pneumoniae NOT  DETECTED NOT DETECTED Final   Streptococcus pyogenes NOT DETECTED NOT DETECTED Final   A.calcoaceticus-baumannii NOT DETECTED NOT DETECTED Final   Bacteroides fragilis NOT DETECTED NOT DETECTED Final   Enterobacterales NOT DETECTED NOT DETECTED Final   Enterobacter cloacae complex NOT DETECTED NOT DETECTED Final   Escherichia coli NOT DETECTED NOT DETECTED Final   Klebsiella aerogenes NOT DETECTED NOT DETECTED Final   Klebsiella oxytoca NOT DETECTED NOT DETECTED Final   Klebsiella pneumoniae NOT DETECTED NOT DETECTED Final   Proteus species NOT DETECTED NOT DETECTED Final   Salmonella species NOT DETECTED NOT DETECTED Final   Serratia marcescens NOT DETECTED NOT DETECTED Final   Haemophilus influenzae NOT DETECTED NOT DETECTED Final   Neisseria meningitidis NOT DETECTED NOT DETECTED Final   Pseudomonas aeruginosa NOT DETECTED NOT DETECTED Final   Stenotrophomonas maltophilia NOT DETECTED NOT DETECTED Final   Candida albicans NOT DETECTED NOT DETECTED Final   Candida auris NOT DETECTED NOT DETECTED Final   Candida glabrata NOT DETECTED NOT DETECTED Final   Candida krusei NOT DETECTED NOT DETECTED Final   Candida parapsilosis NOT DETECTED NOT DETECTED Final   Candida tropicalis NOT DETECTED NOT DETECTED Final   Cryptococcus neoformans/gattii NOT DETECTED NOT DETECTED Final   Vancomycin resistance NOT DETECTED NOT DETECTED Final    Comment: Performed at [facility], [address] [place], [place] [address]  MRSA Next Gen by PCR, Nasal     Status: None   Collection Time: 12/17/22  2:26 AM   Specimen: Nasal Mucosa; Nasal Swab  Result Value Ref Range Status   MRSA by PCR Next Gen NOT DETECTED NOT DETECTED Final    Comment: (NOTE) The GeneXpert MRSA Assay (FDA approved for NASAL specimens only), is one component of a comprehensive MRSA colonization surveillance program. It is not intended to diagnose MRSA infection nor to guide or monitor treatment for MRSA infections. Test  performance is not FDA approved in patients less than 23 years old. Performed at [facility], [address] [place], [place] [address]   Culture, blood (Routine X 2) w Reflex to ID Panel     Status: None (Preliminary result)   Collection Time: 12/17/22  9:01 PM   Specimen: BLOOD  Result Value Ref Range Status   Specimen Description BLOOD SITE NOT SPECIFIED  Final   Special Requests   Final    BOTTLES DRAWN AEROBIC AND ANAEROBIC Blood Culture results may not be optimal due to an excessive volume of blood received in culture bottles   Culture   Final    NO GROWTH 2 DAYS Performed at [facility], [address], [place], [place] [address]    Report Status PENDING  Incomplete  Culture, blood (Routine X 2) w Reflex to ID Panel     Status: None (Preliminary result)   Collection Time: 12/17/22  9:01 PM   Specimen: BLOOD  Result Value Ref Range Status   Specimen Description BLOOD SITE NOT SPECIFIED  Final   Special Requests   Final    BOTTLES DRAWN AEROBIC AND ANAEROBIC Blood Culture results may not be optimal due to an excessive volume of blood received in culture bottles   Culture   Final    NO GROWTH 2 DAYS Performed at [facility], [address], [place], [place] [address]    Report Status PENDING  Incomplete      TODAY-DAY OF DISCHARGE:  Subjective:   Iskander Stuebe today has signed out against medical advice. He was warned about the life threatening and life disabling effects by MD or RN.   Objective:   Blood pressure 124/84, pulse 76, temperature 97.7 F (36.5 C), temperature source Oral, resp. rate 16, height 5\' 10"  (1.778 m), weight 62.2 kg, SpO2 96 %.   DISCHARGE CONDITION: Not stable for discharge-left AMA  DISPOSITION: AMA   Follow with your PCP in 1 week   Total Time spent on discharge equals 25  minutes.  SignedJeoffrey Massed 12/20/2022 8:46 AM

## 2022-12-21 LAB — CULTURE, BLOOD (ROUTINE X 2)
Culture: NO GROWTH
Culture: NO GROWTH
Special Requests: ADEQUATE

## 2022-12-22 LAB — CULTURE, BLOOD (ROUTINE X 2)

## 2022-12-22 NOTE — Progress Notes (Signed)
Late Note Entry  Pt left hospital AMA on Saturday per chart review. Contacted [facility] this morning to make clinic aware of the above info.   Olivia Canter Renal Navigator [phone]

## 2022-12-24 LAB — CULTURE, BLOOD (ROUTINE X 2): Special Requests: ADEQUATE

## 2022-12-30 ENCOUNTER — Other Ambulatory Visit: Payer: Self-pay | Admitting: *Deleted

## 2022-12-30 DIAGNOSIS — N186 End stage renal disease: Secondary | ICD-10-CM

## 2023-01-01 ENCOUNTER — Telehealth: Payer: Self-pay

## 2023-01-01 NOTE — Telephone Encounter (Signed)
Pt called stating that he was d/c'd from the [place] in [place] and told that he had blood clots in his neck. They started him on anticoagulants.  Reviewed pt's chart, returned call for clarification, no answer, lf vm instructing [facility] to make the proper appointment for him.  Checked with Carlyn Reichert at the DVT clinic to see if they could assist. Need more information from the referral before making a determination.

## 2023-01-09 NOTE — Progress Notes (Unsigned)
POST OPERATIVE OFFICE NOTE    CC:  F/u for surgery  HPI:  This is a 67 y.o. male who is s/p right 1st stage BVT on 11/28/2022 by Dr. Chestine Spore.  Dialysis access hx: -left RC AVF 04/12/2021 Dr. Chestine Spore -left RC AVF revision and side branch ligation 07/15/2021 Dr. Chestine Spore -left 1st stage BVT 10/18/2021 Dr. Chestine Spore -left 2nd stage BVT 01/27/2022 Dr. Chestine Spore -evacuation left arm hematoma and repair of BVT and placement [facility] 05/01/2022 Dr. Lenell Antu -right 1st stage BVT 11/28/2022 Dr. Chestine Spore    Pt states he does not have pain/numbness in the right hand.  He states that he had a left IJ TDC placed and this had to be removed and reinserted in the right groin.  He was placed on Eliquis b/c of blood clot around the catheter.  He was started on this a little over a week ago.    Last month, he had enterococcus bacteremia and was hospitalized and on abx.  His catheter was removed due to this.  He states he is ready to have the catheter out to avoid infection.  The pt is on dialysis T/T/S at [facility] location.   No Known Allergies  Current Outpatient Medications  Medication Sig Dispense Refill   albuterol (VENTOLIN HFA) 108 (90 Base) MCG/ACT inhaler Inhale 2 puffs into the lungs every 6 (six) hours as needed for wheezing or shortness of breath.     amLODipine (NORVASC) 10 MG tablet Take 1 tablet (10 mg total) by mouth daily. 90 tablet 3   aspirin EC 81 MG tablet Take 81 mg by mouth daily.     Blood Pressure KIT Please check blood pressure every day, and keep a log. (Patient not taking: Reported on 12/17/2022) 1 kit 0   diclofenac Sodium (VOLTAREN) 1 % GEL Apply 1 Application topically 2 (two) times daily as needed (pain).     furosemide (LASIX) 40 MG tablet Take 80 mg by mouth daily.     hydrALAZINE (APRESOLINE) 25 MG tablet Take 1 tablet (25 mg total) by mouth daily. (Patient taking differently: Take 25 mg by mouth in the morning and at bedtime.)     isosorbide mononitrate (IMDUR) 30 MG 24 hr tablet Take 1  tablet (30 mg total) by mouth daily. 90 tablet 3   labetalol (NORMODYNE) 100 MG tablet Take 2 tablets (200 mg total) by mouth every morning. (Patient taking differently: Take 100 mg by mouth See admin instructions. 100 mg twice daily, except 1 tablet daily on dialysis days (taken after dialysis).) 180 tablet 3   multivitamin (RENA-VIT) TABS tablet Take 1 tablet by mouth daily.     omeprazole (PRILOSEC) 40 MG capsule Take 1 capsule (40 mg total) by mouth daily. (Patient taking differently: Take 40 mg by mouth daily as needed (heartburn).) 30 capsule 2   oxyCODONE-acetaminophen (PERCOCET) 5-325 MG tablet Take 1 tablet by mouth every 6 (six) hours as needed for severe pain. (Patient not taking: Reported on 12/18/2022) 8 tablet 0   tamsulosin (FLOMAX) 0.4 MG CAPS capsule Take 0.4 mg by mouth 2 (two) times daily.     No current facility-administered medications for this visit.     ROS:  See HPI  Physical Exam:  Today's Vitals   01/12/23 1344  BP: 136/73  Pulse: 76  Resp: 20  Temp: 98.2 F (36.8 C)  SpO2: 97%  Weight: 145 lb (65.8 kg)  Height: 5\' 10"  (1.778 m)   Body mass index is 20.81 kg/m.  Incision:  healed Extremities:   There is a palpable right radial pulse.   Motor and sensory are in tact.   There is a thrill/bruit present.  Access is  easily palpable   Dialysis Duplex on 01/12/2023: 0.6cm diameter 0.38-0.54 cm depth   Assessment/Plan:  This is a 67 y.o. male who is s/p: right 1st stage BVT on 11/28/2022 by Dr. Chestine Spore.  -the pt does not have evidence of steal. -pt fistula is maturing nicely and can proceed with second stage BVT.  He was recently put on Eliquis for clot on TDC.  Discussed with Dr. Randie Heinz and will schedule in about 4 weeks to allow pt to be on blood thinner for about a month prior to stopping it for surgery.   -pt's access can not be used until at least July as that will be 3 months from creation.  -if pt has tunneled catheter, this can be removed at the  discretion of the dialysis center once the pt's access has been successfully cannulated to their satisfaction.  -discussed with pt that access does not last forever and will need intervention or even new access at some point.     Doreatha Massed, [facility] [phone]  Clinic MD:  Randie Heinz on call MD

## 2023-01-12 ENCOUNTER — Ambulatory Visit (INDEPENDENT_AMBULATORY_CARE_PROVIDER_SITE_OTHER): Payer: No Typology Code available for payment source | Admitting: Physician Assistant

## 2023-01-12 ENCOUNTER — Encounter: Payer: Self-pay | Admitting: Physician Assistant

## 2023-01-12 ENCOUNTER — Ambulatory Visit (HOSPITAL_COMMUNITY)
Admission: RE | Admit: 2023-01-12 | Discharge: 2023-01-12 | Disposition: A | Discharge status: Home / Self Care | Payer: No Typology Code available for payment source | Source: Ambulatory Visit | Attending: Vascular Surgery | Admitting: Vascular Surgery

## 2023-01-12 VITALS — BP 136/73 | HR 76 | Temp 98.2°F | Resp 20 | Ht 70.0 in | Wt 145.0 lb

## 2023-01-12 DIAGNOSIS — N186 End stage renal disease: Secondary | ICD-10-CM | POA: Diagnosis present

## 2023-01-12 DIAGNOSIS — Z992 Dependence on renal dialysis: Secondary | ICD-10-CM | POA: Insufficient documentation

## 2023-01-15 ENCOUNTER — Telehealth: Payer: Self-pay

## 2023-01-15 NOTE — Telephone Encounter (Signed)
Attempted to reach patient to schedule surgery, no answer and VM full.

## 2023-01-16 NOTE — Telephone Encounter (Signed)
Unable to reach or leave VM for patient d/t vm full.

## 2023-01-21 ENCOUNTER — Other Ambulatory Visit: Payer: Self-pay

## 2023-01-21 DIAGNOSIS — Z992 Dependence on renal dialysis: Secondary | ICD-10-CM

## 2023-01-21 NOTE — Telephone Encounter (Signed)
Spoke with patient and scheduled surgery for 7/10. Instructions provided and will also be mailed to patient. He verbalized understanding.

## 2023-02-07 IMAGING — DX DG CHEST 1V PORT
1 series · 1 of 1 positions shown · non-contrast
Comparison: 07/17/2020

CLINICAL DATA: Cough.  COVID positive.

EXAM:
PORTABLE CHEST 1 VIEW

[chest]
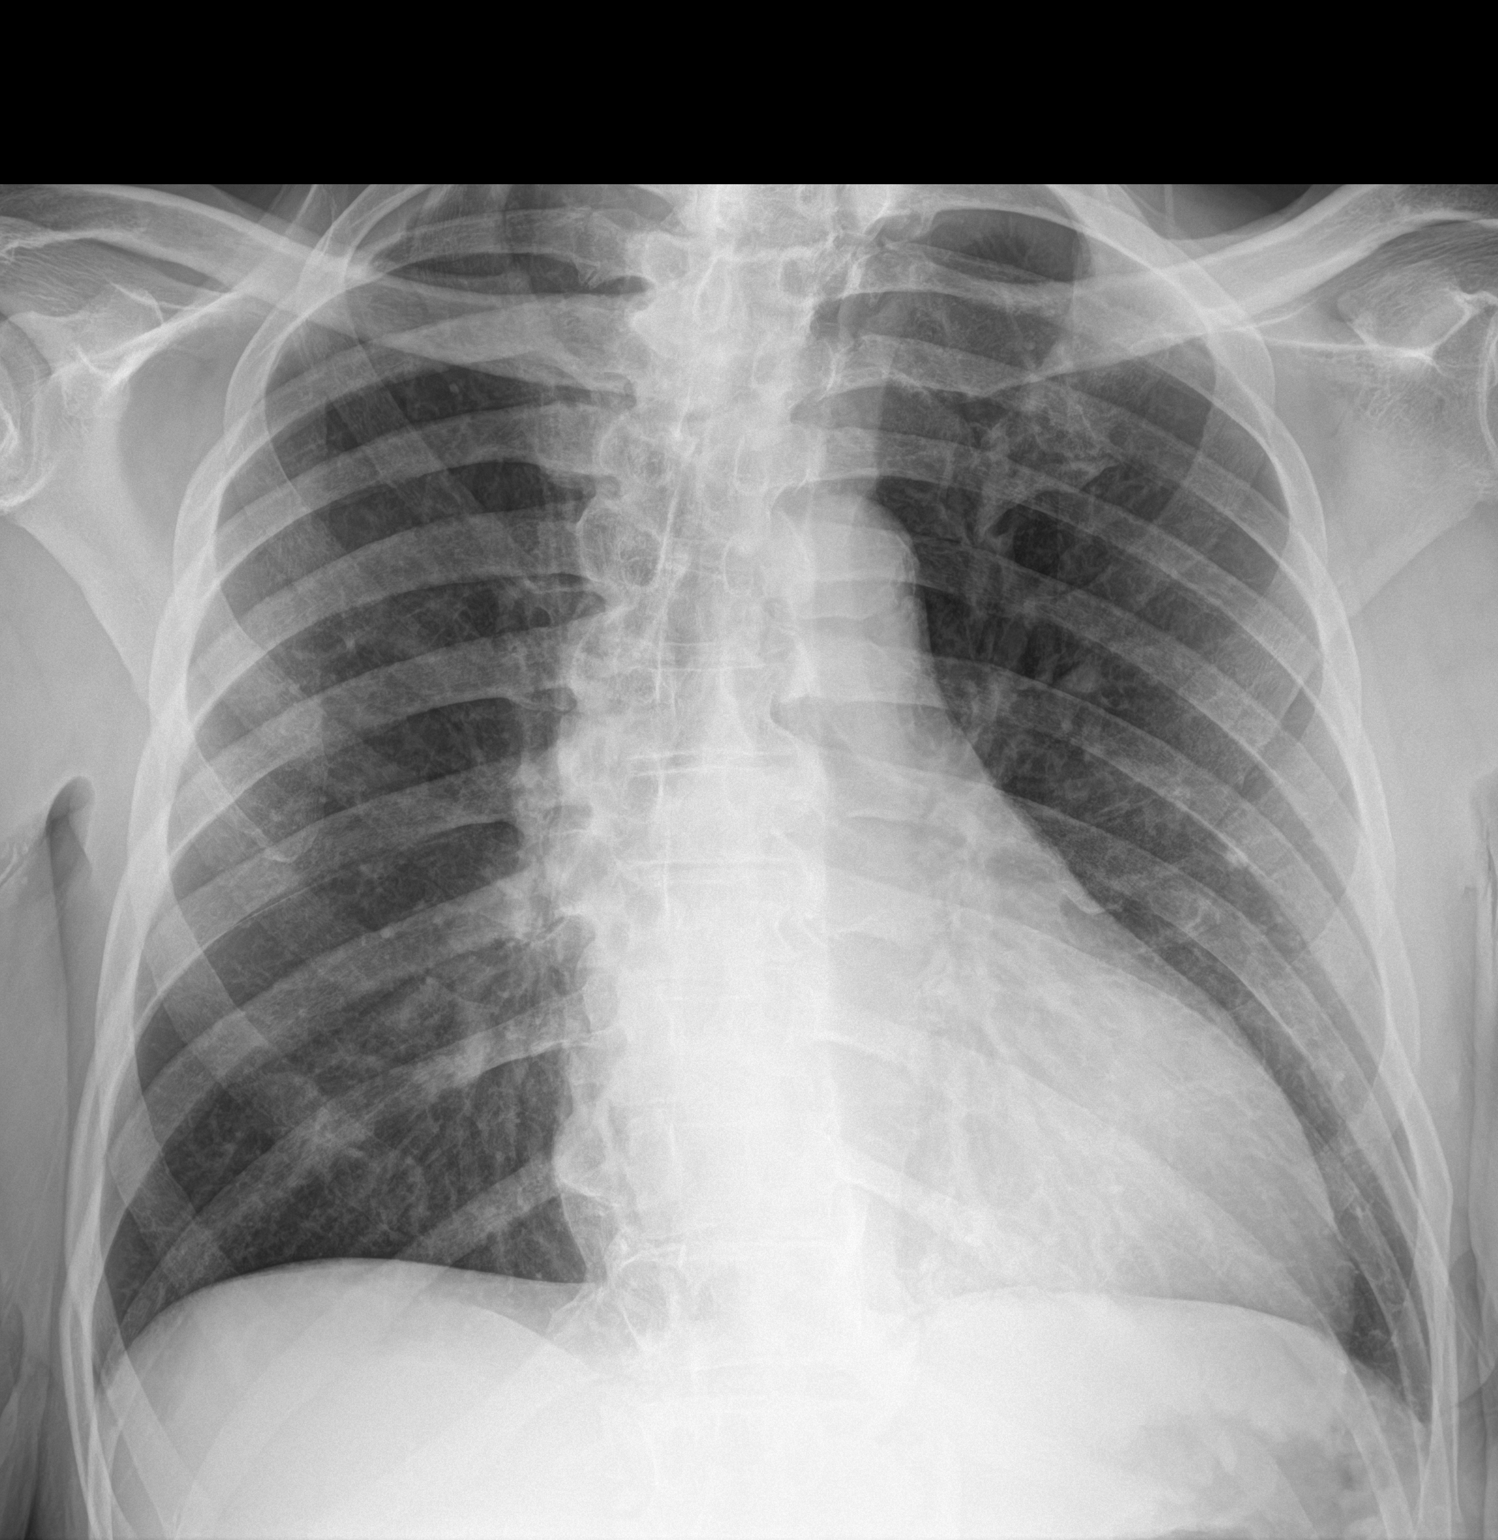

[1 of 1 positions shown; findings below may reference images not displayed]

FINDINGS: Patient is rotated. Mild cardiomegaly. Unchanged mediastinal
contours. No acute or focal airspace disease. No pulmonary edema,
pleural effusion, or pneumothorax. No acute osseous abnormalities
are seen.
IMPRESSION: Mild cardiomegaly.  No focal airspace disease.

## 2023-02-16 NOTE — Progress Notes (Signed)
Anesthesia Chart Review: Same day workup  68 yo with pertinent hx including ESRD on HD TTS, chronic HFrEF, HTN, BPH, polysubstance abuse-who recently left the hospital AGAINST MEDICAL ADVICE on 5/9 after being admitted for CHF exacerbation/enterococcal bacteremia-presented back to the ED on the same day and was subsequently admitted to [facility]. 5/7-5/9>> hospitalized at [facility] for HFrEF exacerbation-blood cultures positive for Enterococcus-signed out AMA. 5/9>> back to ED-readmit to [facility]. 5/10>> HD catheter removed. 5/11>> signed out AMA.  Patient subsequently had catheter placed in the right groin and is now dialyzing through this.  He previously had right first stage BVT on 11/28/2022 by Dr. Chestine Spore.  He was seen in the vascular clinic on 01/12/2023 and discussed that he was recently placed on Eliquis for clot in his [facility].  At that time was advised to wait about 4 weeks until holding blood thinner to undergo second stage BVT.  Follows with cardiology at [facility] for history of HFrEF felt likely secondary to nonischemic cardiomyopathy.  Last seen 09/10/2022.  Per note, "1. Shortness of breath: Certainly multifactorial with deconditioning, dialysis, possible heart disease, likely lung disease all contributing. Moderate DLCO abnormality noted on recent PFTs, updated echocardiogram showed new onset cardiomyopathy, EF 30 to 35%, likely in the setting of missing dialysis,  hypervolemia, CHF exacerbation. --Chest CT has been ordered for further evaluation of lung disease. --Nuclear medicine stress testing was negative for inducible ischemia, showed fixed defect. 2. 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 Symptomatically improved with temporary venous access and reinitiation of hemodialysis. I am hesitant to change medications in the setting of dialysis and labile blood pressure. Plan on rechecking EF on  follow-up. If this continues to be depressed we will discuss pursuing GDMT. "  Surgeon's office instructed pt LD of Eliquis on 02/14/23.  Pt will need DOS labs and eval.  EKG 12/18/2022: Sinus rhythm. Rate 71.  Atrial premature complex. Right atrial enlargement. LVH with secondary repolarization abnormality. Anterior Q waves, possibly due to LVH  TTE 12/19/2022:  1. Left ventricular ejection fraction, by estimation, is 30 to 35%. The  left ventricle has moderately decreased function. The left ventricle  demonstrates global hypokinesis. There is mild left ventricular  hypertrophy. Left ventricular diastolic  parameters are consistent with Grade II diastolic dysfunction  (pseudonormalization). Elevated left atrial pressure.   2. Right ventricular systolic function is mildly reduced. The right  ventricular size is mildly enlarged. Tricuspid regurgitation signal is  inadequate for assessing PA pressure.   3. Left atrial size was severely dilated.   4. The mitral valve is normal in structure. No evidence of mitral valve  regurgitation. No evidence of mitral stenosis.   5. The aortic valve is tricuspid. Aortic valve regurgitation is trivial.  No aortic stenosis is present.   6. The inferior vena cava is normal in size with greater than 50%  respiratory variability, suggesting right atrial pressure of 3 mmHg.   Conclusion(s)/Recommendation(s): No evidence of valvular vegetations on  this transthoracic echocardiogram. Consider a transesophageal  echocardiogram to exclude infective endocarditis if clinically indicated.   TTE 09/01/2022 [facility]) The left ventricle is mildly dilated. Left ventricular systolic function is severely reduced. Ejection Fraction = 30-35% (Visual Estimation). Diastolic dysfunction, Grade III (restrictive pattern), consistent with markedly  increased left atrial pressure. EF= 34% by 2D biplane method. Preserved RV function Mild aortic regurgitation. There is  mild mitral regurgitation. There is mild  to moderate tricuspid regurgitation. RV systolic pressure estimated to be 40-45 mmhg  Nuclear medicine stress testing 07/16/2022 [facility]): 1. Myocardial perfusion stress study is abnormal. There is a fixed moderate  inferior and severe apical defect suggestive of prior infarct with no  inducible ischemia.  2. Overall left ventricle systolic function is abnormal. The calculated left  ventricular ejection fraction is 33%. There is inferior wall hypokinesis. 3. No TID. 4. Negative for myocardial ischemia by ECG criteria with low sensitivity.  Pulmonary function testing 03/25/2022 [facility]): Full PFTs (Spirometry, Lung volumes, DLCO) PRE-BRONCHODILATOR SPIROMETRY No spirometry evidence of obstruction BRONCHODILATOR RESPONSE AND POST-BRONCHODILATOR SPIROMETRY Not tested LUNG VOLUMES Restriction present (TLC < 80% predicted) Mild (FEV1 > 70% if no obstruction present, TLC 60-80% if obstruction is also present) DLCO Moderate gas transfer defect (DLCO or DLCOAdj 40-60% predicted) Impression Restriction with gas transfer defect. If clinically appropriate, a non-contrast diagnostic chest CT would be the next step in the evaluation of this PFT pattern.     Zannie Cove [facility] Short Stay Center/Anesthesiology Phone [phone] 02/16/2023 3:08 PM

## 2023-02-16 NOTE — Anesthesia Preprocedure Evaluation (Signed)
Anesthesia Evaluation  Patient identified by MRN, date of birth, ID band Patient awake    Reviewed: Allergy & Precautions, H&P , NPO status , Patient's Chart, lab work & pertinent test results  Airway Mallampati: II  TM Distance: >3 FB Neck ROM: Full    Dental  (+) Chipped, Dental Advisory Given   Pulmonary COPD, Current Smoker and Patient abstained from smoking.   Pulmonary exam normal breath sounds clear to auscultation       Cardiovascular hypertension, + CAD, + Past MI and +CHF  Normal cardiovascular exam Rhythm:Regular Rate:Normal  NICM EF 30-35%   Neuro/Psych negative neurological ROS  negative psych ROS   GI/Hepatic ,GERD  ,,(+)     substance abuse  alcohol use and cocaine use  Endo/Other  negative endocrine ROS    Renal/GU DialysisRenal disease  negative genitourinary   Musculoskeletal negative musculoskeletal ROS (+)    Abdominal   Peds negative pediatric ROS (+)  Hematology negative hematology ROS (+)   Anesthesia Other Findings   Reproductive/Obstetrics negative OB ROS                             Anesthesia Physical Anesthesia Plan  ASA: 4  Anesthesia Plan: General   Post-op Pain Management: Minimal or no pain anticipated   Induction: Intravenous  PONV Risk Score and Plan: 1 and Ondansetron and Treatment may vary due to age or medical condition  Airway Management Planned: Oral ETT  Additional Equipment:   Intra-op Plan:   Post-operative Plan: Extubation in OR  Informed Consent: I have reviewed the patients History and Physical, chart, labs and discussed the procedure including the risks, benefits and alternatives for the proposed anesthesia with the patient or authorized representative who has indicated his/her understanding and acceptance.     Dental advisory given  Plan Discussed with: CRNA and Surgeon  Anesthesia Plan Comments: (Etomidate for  induction  PAT note by Antionette Poles, PA-C:  67 yo with pertinent hx including ESRD on HD TTS, chronic HFrEF, HTN, BPH, polysubstance abuse-who recently left the hospital AGAINST MEDICAL ADVICE on 5/9 after being admitted for CHF exacerbation/enterococcal bacteremia-presented back to the ED on the same day and was subsequently admitted to [facility]. 5/7-5/9>> hospitalized at [facility] for HFrEF exacerbation-blood cultures positive for Enterococcus-signed out AMA. 5/9>> back to ED-readmit to [facility]. 5/10>> HD catheter removed. 5/11>> signed out AMA.  Patient subsequently had catheter placed in the right groin and is now dialyzing through this.  He previously had right first stage BVT on 11/28/2022 by Dr. Chestine Spore.  He was seen in the vascular clinic on 01/12/2023 and discussed that he was recently placed on Eliquis for clot in his [facility].  At that time was advised to wait about 4 weeks until holding blood thinner to undergo second stage BVT.  Follows with cardiology at [facility] for history of HFrEF felt likely secondary to nonischemic cardiomyopathy.  Last seen 09/10/2022.  Per note, "1. Shortness of breath: Certainly multifactorial with deconditioning, dialysis, possible heart disease, likely lung disease all contributing. Moderate DLCO abnormality noted on recent PFTs, updated echocardiogram showed new onset cardiomyopathy, EF 30 to 35%, likely in the setting of missing dialysis,  hypervolemia, CHF exacerbation. --Chest CT has been ordered for further evaluation of lung disease. --Nuclear medicine stress testing was negative for inducible ischemia, showed fixed defect. 2. 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 Symptomatically improved with temporary venous access and reinitiation of hemodialysis. I am hesitant to change medications in the setting of dialysis and labile blood pressure. Plan on rechecking EF on  follow-up. If this continues to be depressed we will discuss pursuing GDMT. "  Surgeon's office instructed pt LD of Eliquis on 02/14/23.  Pt will need DOS labs and eval.  EKG 12/18/2022: Sinus rhythm. Rate 71.  Atrial premature complex. Right atrial enlargement. LVH with secondary repolarization abnormality. Anterior Q waves, possibly due to LVH  TTE 12/19/2022: 1. Left ventricular ejection fraction, by estimation, is 30 to 35%. The  left ventricle has moderately decreased function. The left ventricle  demonstrates global hypokinesis. There is mild left ventricular  hypertrophy. Left ventricular diastolic  parameters are consistent with Grade II diastolic dysfunction  (pseudonormalization). Elevated left atrial pressure.  2. Right ventricular systolic function is mildly reduced. The right  ventricular size is mildly enlarged. Tricuspid regurgitation signal is  inadequate for assessing PA pressure.  3. Left atrial size was severely dilated.  4. The mitral valve is normal in structure. No evidence of mitral valve  regurgitation. No evidence of mitral stenosis.  5. The aortic valve is tricuspid. Aortic valve regurgitation is trivial.  No aortic stenosis is present.  6. The inferior vena cava is normal in size with greater than 50%  respiratory variability, suggesting right atrial pressure of 3 mmHg.   Conclusion(s)/Recommendation(s): No evidence of valvular vegetations on  this transthoracic echocardiogram. Consider a transesophageal  echocardiogram to exclude infective endocarditis if clinically indicated.   TTE 09/01/2022 [facility]) The left ventricle is mildly dilated. Left ventricular systolic function is severely reduced. Ejection Fraction = 30-35% (Visual Estimation). Diastolic dysfunction, Grade III (restrictive pattern), consistent with markedly  increased left atrial pressure. EF= 34% by 2D biplane method. Preserved RV function Mild aortic regurgitation. There is  mild mitral regurgitation. There is mild to moderate tricuspid regurgitation. RV systolic pressure estimated to be 40-45 mmhg  Nuclear medicine stress testing 07/16/2022 [facility]): 1. Myocardial perfusion stress study is abnormal. There is a fixed moderate  inferior and severe apical defect suggestive of prior infarct with no  inducible ischemia.  2. Overall left ventricle systolic function is abnormal. The calculated left  ventricular ejection fraction is 33%. There is inferior wall hypokinesis. 3. No TID. 4. Negative for myocardial ischemia by ECG criteria with low sensitivity.  Pulmonary function testing 03/25/2022 [facility]): Full PFTs (Spirometry, Lung volumes, DLCO) PRE-BRONCHODILATOR SPIROMETRY No spirometry evidence of obstruction BRONCHODILATOR RESPONSE AND POST-BRONCHODILATOR SPIROMETRY Not tested LUNG VOLUMES Restriction present (TLC < 80% predicted) Mild (FEV1 > 70% if no obstruction present, TLC 60-80% if obstruction is also present) DLCO Moderate gas transfer defect (DLCO or DLCOAdj 40-60% predicted) Impression Restriction with gas transfer defect. If clinically appropriate, a non-contrast diagnostic chest CT would be the next step in the evaluation of this PFT pattern.     )        Anesthesia Quick Evaluation

## 2023-02-17 ENCOUNTER — Other Ambulatory Visit: Payer: Self-pay

## 2023-02-17 ENCOUNTER — Encounter (HOSPITAL_COMMUNITY): Payer: Self-pay | Admitting: Vascular Surgery

## 2023-02-17 NOTE — Progress Notes (Signed)
[facility] Cardiologist - Dr Olga Millers  Chest x-ray - 12/16/22 (1V) EKG - 12/18/22 Stress Test - 07/16/22 CE ECHO - 12/19/22 Cardiac Cath - n/a  ICD Pacemaker/Loop - n/a  Sleep Study -  n/a CPAP - none  Diabetes Type - n/a  NPO  Anesthesia review: Yes  Last dose of Eilquis was on 02/14/23.  STOP now taking any Aspirin (unless otherwise instructed by your surgeon), Aleve, Naproxen, Ibuprofen, Motrin, Advil, Goody's, BC's, all herbal medications, fish oil, Voltaren Gel and all vitamins.   Coronavirus Screening Do you have any of the following symptoms:  Cough yes/no: No Fever (>100.69F)  yes/no: No Runny nose yes/no: No Sore throat yes/no: No Difficulty breathing/shortness of breath  yes/no: No  Have you traveled in the last 14 days and where? yes/no: No  Patient verbalized understanding of instructions that were given via phone.

## 2023-02-18 ENCOUNTER — Ambulatory Visit (HOSPITAL_COMMUNITY)
Admission: RE | Admit: 2023-02-18 | Discharge: 2023-02-18 | Disposition: A | Discharge status: Home / Self Care | Payer: No Typology Code available for payment source | Attending: Vascular Surgery | Admitting: Vascular Surgery

## 2023-02-18 ENCOUNTER — Other Ambulatory Visit: Payer: Self-pay

## 2023-02-18 ENCOUNTER — Ambulatory Visit (HOSPITAL_BASED_OUTPATIENT_CLINIC_OR_DEPARTMENT_OTHER): Payer: No Typology Code available for payment source | Admitting: Physician Assistant

## 2023-02-18 ENCOUNTER — Encounter (HOSPITAL_COMMUNITY)
Admission: RE | Disposition: A | Discharge status: Home / Self Care | Payer: Self-pay | Source: Home / Self Care | Attending: Vascular Surgery

## 2023-02-18 ENCOUNTER — Other Ambulatory Visit (HOSPITAL_COMMUNITY): Payer: Self-pay

## 2023-02-18 ENCOUNTER — Encounter (HOSPITAL_COMMUNITY): Payer: Self-pay | Admitting: Vascular Surgery

## 2023-02-18 ENCOUNTER — Ambulatory Visit (HOSPITAL_COMMUNITY): Payer: No Typology Code available for payment source | Admitting: Physician Assistant

## 2023-02-18 DIAGNOSIS — I132 Hypertensive heart and chronic kidney disease with heart failure and with stage 5 chronic kidney disease, or end stage renal disease: Secondary | ICD-10-CM | POA: Diagnosis not present

## 2023-02-18 DIAGNOSIS — I2510 Atherosclerotic heart disease of native coronary artery without angina pectoris: Secondary | ICD-10-CM | POA: Diagnosis not present

## 2023-02-18 DIAGNOSIS — I252 Old myocardial infarction: Secondary | ICD-10-CM | POA: Diagnosis not present

## 2023-02-18 DIAGNOSIS — N186 End stage renal disease: Secondary | ICD-10-CM | POA: Insufficient documentation

## 2023-02-18 DIAGNOSIS — I5022 Chronic systolic (congestive) heart failure: Secondary | ICD-10-CM | POA: Insufficient documentation

## 2023-02-18 DIAGNOSIS — Z992 Dependence on renal dialysis: Secondary | ICD-10-CM | POA: Insufficient documentation

## 2023-02-18 DIAGNOSIS — N400 Enlarged prostate without lower urinary tract symptoms: Secondary | ICD-10-CM | POA: Insufficient documentation

## 2023-02-18 DIAGNOSIS — K219 Gastro-esophageal reflux disease without esophagitis: Secondary | ICD-10-CM | POA: Insufficient documentation

## 2023-02-18 DIAGNOSIS — N185 Chronic kidney disease, stage 5: Secondary | ICD-10-CM | POA: Diagnosis not present

## 2023-02-18 DIAGNOSIS — F17210 Nicotine dependence, cigarettes, uncomplicated: Secondary | ICD-10-CM

## 2023-02-18 DIAGNOSIS — F17200 Nicotine dependence, unspecified, uncomplicated: Secondary | ICD-10-CM | POA: Diagnosis not present

## 2023-02-18 DIAGNOSIS — J449 Chronic obstructive pulmonary disease, unspecified: Secondary | ICD-10-CM | POA: Diagnosis not present

## 2023-02-18 DIAGNOSIS — I428 Other cardiomyopathies: Secondary | ICD-10-CM | POA: Insufficient documentation

## 2023-02-18 DIAGNOSIS — I5023 Acute on chronic systolic (congestive) heart failure: Secondary | ICD-10-CM | POA: Diagnosis not present

## 2023-02-18 HISTORY — PX: BASCILIC VEIN TRANSPOSITION: SHX5742

## 2023-02-18 LAB — POCT I-STAT, CHEM 8
BUN: 37 mg/dL — ABNORMAL HIGH (ref 8–23)
Calcium, Ion: 1.14 mmol/L — ABNORMAL LOW (ref 1.15–1.40)
Chloride: 102 mmol/L (ref 98–111)
Creatinine, Ser: 7.8 mg/dL — ABNORMAL HIGH (ref 0.61–1.24)
Glucose, Bld: 107 mg/dL — ABNORMAL HIGH (ref 70–99)
HCT: 43 % (ref 39.0–52.0)
Hemoglobin: 14.6 g/dL (ref 13.0–17.0)
Potassium: 4.3 mmol/L (ref 3.5–5.1)
Sodium: 137 mmol/L (ref 135–145)
TCO2: 26 mmol/L (ref 22–32)

## 2023-02-18 SURGERY — TRANSPOSITION, VEIN, BASILIC
Anesthesia: General | Site: Arm Upper | Laterality: Right

## 2023-02-18 MED ORDER — FENTANYL CITRATE (PF) 250 MCG/5ML IJ SOLN
INTRAMUSCULAR | Status: DC | PRN
  Administered 2023-02-18: 50 ug via INTRAVENOUS

## 2023-02-18 MED ORDER — ETOMIDATE 2 MG/ML IV SOLN
INTRAVENOUS | Status: AC
  Filled 2023-02-18: qty 10

## 2023-02-18 MED ORDER — HEPARIN 6000 UNIT IRRIGATION SOLUTION
Status: DC | PRN
  Administered 2023-02-18: 1

## 2023-02-18 MED ORDER — LIDOCAINE HCL (PF) 1 % IJ SOLN
INTRAMUSCULAR | Status: AC
  Filled 2023-02-18: qty 30

## 2023-02-18 MED ORDER — PHENYLEPHRINE HCL-NACL 20-0.9 MG/250ML-% IV SOLN
INTRAVENOUS | Status: DC | PRN
  Administered 2023-02-18: 30 ug/min via INTRAVENOUS

## 2023-02-18 MED ORDER — SUGAMMADEX SODIUM 200 MG/2ML IV SOLN
INTRAVENOUS | Status: DC | PRN
  Administered 2023-02-18: 150 mg via INTRAVENOUS

## 2023-02-18 MED ORDER — OXYCODONE HCL 5 MG PO TABS
5.0000 mg | ORAL_TABLET | Freq: Four times a day (QID) | ORAL | 0 refills | Status: DC | PRN
  Filled 2023-02-18: qty 15, 4d supply, fill #0

## 2023-02-18 MED ORDER — 0.9 % SODIUM CHLORIDE (POUR BTL) OPTIME
TOPICAL | Status: DC | PRN
  Administered 2023-02-18: 1000 mL

## 2023-02-18 MED ORDER — CHLORHEXIDINE GLUCONATE 0.12 % MT SOLN
OROMUCOSAL | Status: AC
  Filled 2023-02-18: qty 15

## 2023-02-18 MED ORDER — LIDOCAINE 2% (20 MG/ML) 5 ML SYRINGE
INTRAMUSCULAR | Status: DC | PRN
  Administered 2023-02-18: 80 mg via INTRAVENOUS

## 2023-02-18 MED ORDER — SODIUM CHLORIDE 0.9 % IV SOLN: INTRAVENOUS | Status: DC

## 2023-02-18 MED ORDER — OXYCODONE HCL 5 MG PO TABS
5.0000 mg | ORAL_TABLET | Freq: Once | ORAL | Status: AC | PRN
  Administered 2023-02-18: 5 mg via ORAL

## 2023-02-18 MED ORDER — CHLORHEXIDINE GLUCONATE 0.12 % MT SOLN: 15.0000 mL | OROMUCOSAL | Status: DC

## 2023-02-18 MED ORDER — OXYCODONE HCL 5 MG/5ML PO SOLN: 5.0000 mg | Freq: Once | ORAL | Status: AC | PRN

## 2023-02-18 MED ORDER — ROCURONIUM BROMIDE 10 MG/ML (PF) SYRINGE
PREFILLED_SYRINGE | INTRAVENOUS | Status: DC | PRN
  Administered 2023-02-18: 40 mg via INTRAVENOUS

## 2023-02-18 MED ORDER — ONDANSETRON HCL 4 MG/2ML IJ SOLN: 4.0000 mg | Freq: Once | INTRAMUSCULAR | Status: DC | PRN

## 2023-02-18 MED ORDER — ONDANSETRON HCL 4 MG/2ML IJ SOLN
INTRAMUSCULAR | Status: DC | PRN
  Administered 2023-02-18: 4 mg via INTRAVENOUS

## 2023-02-18 MED ORDER — MIDAZOLAM HCL 2 MG/2ML IJ SOLN
INTRAMUSCULAR | Status: AC
  Filled 2023-02-18: qty 2

## 2023-02-18 MED ORDER — DEXAMETHASONE SODIUM PHOSPHATE 10 MG/ML IJ SOLN
INTRAMUSCULAR | Status: DC | PRN
  Administered 2023-02-18: 5 mg via INTRAVENOUS

## 2023-02-18 MED ORDER — OXYCODONE HCL 5 MG PO TABS
ORAL_TABLET | ORAL | Status: AC
  Filled 2023-02-18: qty 1

## 2023-02-18 MED ORDER — FENTANYL CITRATE (PF) 100 MCG/2ML IJ SOLN: 25.0000 ug | INTRAMUSCULAR | Status: DC | PRN

## 2023-02-18 MED ORDER — CEFAZOLIN SODIUM-DEXTROSE 2-4 GM/100ML-% IV SOLN
2.0000 g | INTRAVENOUS | Status: AC
  Administered 2023-02-18: 2 g via INTRAVENOUS
  Filled 2023-02-18: qty 100

## 2023-02-18 MED ORDER — ETOMIDATE 2 MG/ML IV SOLN
INTRAVENOUS | Status: DC | PRN
  Administered 2023-02-18: 14 mg via INTRAVENOUS

## 2023-02-18 MED ORDER — HEMOSTATIC AGENTS (NO CHARGE) OPTIME
TOPICAL | Status: DC | PRN
  Administered 2023-02-18: 1 via TOPICAL

## 2023-02-18 MED ORDER — ACETAMINOPHEN 10 MG/ML IV SOLN: 1000.0000 mg | Freq: Once | INTRAVENOUS | Status: DC | PRN

## 2023-02-18 MED ORDER — HEPARIN 6000 UNIT IRRIGATION SOLUTION
Status: AC
  Filled 2023-02-18: qty 500

## 2023-02-18 MED ORDER — SUCCINYLCHOLINE CHLORIDE 200 MG/10ML IV SOSY
PREFILLED_SYRINGE | INTRAVENOUS | Status: AC
  Filled 2023-02-18: qty 10

## 2023-02-18 MED ORDER — EPINEPHRINE 1 MG/10ML IJ SOSY
PREFILLED_SYRINGE | INTRAMUSCULAR | Status: DC | PRN
  Administered 2023-02-18 (×6): 10 ug via INTRAVENOUS

## 2023-02-18 MED ORDER — CHLORHEXIDINE GLUCONATE 4 % EX SOLN: 60.0000 mL | Freq: Once | CUTANEOUS | Status: DC

## 2023-02-18 MED ORDER — HEPARIN SODIUM (PORCINE) 1000 UNIT/ML IJ SOLN
INTRAMUSCULAR | Status: DC | PRN
  Administered 2023-02-18: 3000 [IU] via INTRAVENOUS

## 2023-02-18 MED ORDER — PHENYLEPHRINE 80 MCG/ML (10ML) SYRINGE FOR IV PUSH (FOR BLOOD PRESSURE SUPPORT)
PREFILLED_SYRINGE | INTRAVENOUS | Status: DC | PRN
  Administered 2023-02-18: 40 ug via INTRAVENOUS
  Administered 2023-02-18: 80 ug via INTRAVENOUS

## 2023-02-18 MED ORDER — FENTANYL CITRATE (PF) 250 MCG/5ML IJ SOLN
INTRAMUSCULAR | Status: AC
  Filled 2023-02-18: qty 5

## 2023-02-18 SURGICAL SUPPLY — 44 items
ADH SKN CLS APL DERMABOND .7 (GAUZE/BANDAGES/DRESSINGS) ×2
AGENT HMST SPONGE THK3/8 (HEMOSTASIS)
ARMBAND PINK RESTRICT EXTREMIT (MISCELLANEOUS) ×1 IMPLANT
BAG COUNTER SPONGE SURGICOUNT (BAG) ×1 IMPLANT
BAG SPNG CNTER NS LX DISP (BAG) ×1
CANISTER SUCT 3000ML PPV (MISCELLANEOUS) ×1 IMPLANT
CLIP TI MEDIUM 24 (CLIP) ×1 IMPLANT
CLIP TI WIDE RED SMALL 24 (CLIP) ×1 IMPLANT
COVER PROBE W GEL 5X96 (DRAPES) ×1 IMPLANT
DERMABOND ADVANCED .7 DNX12 (GAUZE/BANDAGES/DRESSINGS) ×1 IMPLANT
ELECT REM PT RETURN 9FT ADLT (ELECTROSURGICAL) ×1
ELECTRODE REM PT RTRN 9FT ADLT (ELECTROSURGICAL) ×1 IMPLANT
GLOVE BIO SURGEON STRL SZ7.5 (GLOVE) ×1 IMPLANT
GLOVE BIOGEL PI IND STRL 8 (GLOVE) ×1 IMPLANT
GOWN STRL REUS W/ TWL LRG LVL3 (GOWN DISPOSABLE) ×2 IMPLANT
GOWN STRL REUS W/ TWL XL LVL3 (GOWN DISPOSABLE) ×2 IMPLANT
GOWN STRL REUS W/TWL LRG LVL3 (GOWN DISPOSABLE) ×2
GOWN STRL REUS W/TWL XL LVL3 (GOWN DISPOSABLE) ×2
HEMOSTAT SPONGE AVITENE ULTRA (HEMOSTASIS) IMPLANT
KIT BASIN OR (CUSTOM PROCEDURE TRAY) ×1 IMPLANT
KIT TURNOVER KIT B (KITS) ×1 IMPLANT
LOOP VASCULAR MINI 18 RED (MISCELLANEOUS) ×1
NDL HYPO 25GX1X1/2 BEV (NEEDLE) ×1 IMPLANT
NEEDLE HYPO 25GX1X1/2 BEV (NEEDLE) ×1 IMPLANT
NS IRRIG 1000ML POUR BTL (IV SOLUTION) ×1 IMPLANT
PACK CV ACCESS (CUSTOM PROCEDURE TRAY) ×1 IMPLANT
PAD ARMBOARD 7.5X6 YLW CONV (MISCELLANEOUS) ×2 IMPLANT
POWDER SURGICEL 3.0 GRAM (HEMOSTASIS) IMPLANT
SLING ARM FOAM STRAP LRG (SOFTGOODS) IMPLANT
SLING ARM FOAM STRAP MED (SOFTGOODS) IMPLANT
SPIKE FLUID TRANSFER (MISCELLANEOUS) ×1 IMPLANT
SPONGE T-LAP 18X18 ~~LOC~~+RFID (SPONGE) IMPLANT
SUT MNCRL AB 4-0 PS2 18 (SUTURE) ×1 IMPLANT
SUT PROLENE 6 0 BV (SUTURE) ×1 IMPLANT
SUT PROLENE 7 0 BV 1 (SUTURE) IMPLANT
SUT SILK 2 0 SH (SUTURE) IMPLANT
SUT VIC AB 2-0 CT1 27 (SUTURE)
SUT VIC AB 2-0 CT1 TAPERPNT 27 (SUTURE) ×1 IMPLANT
SUT VIC AB 3-0 SH 27 (SUTURE) ×2
SUT VIC AB 3-0 SH 27X BRD (SUTURE) ×2 IMPLANT
TOWEL GREEN STERILE (TOWEL DISPOSABLE) ×1 IMPLANT
UNDERPAD 30X36 HEAVY ABSORB (UNDERPADS AND DIAPERS) ×1 IMPLANT
VASCULAR TIE MINI RED 18IN STL (MISCELLANEOUS) IMPLANT
WATER STERILE IRR 1000ML POUR (IV SOLUTION) ×1 IMPLANT

## 2023-02-18 NOTE — Transfer of Care (Signed)
Immediate Anesthesia Transfer of Care Note  Patient: Ronald Reeves  Procedure(s) Performed: SECOND STAGE RIGHT ARM BASILIC VEIN TRANSPOSITION (Right: Arm Upper)  Patient Location: PACU  Anesthesia Type:General  Level of Consciousness: drowsy and patient cooperative  Airway & Oxygen Therapy: Patient Spontanous Breathing and Patient connected to face mask oxygen  Post-op Assessment: Report given to RN, Post -op Vital signs reviewed and stable, and Patient moving all extremities X 4  Post vital signs: Reviewed and stable  Last Vitals:  Vitals Value Taken Time  BP 134/64 02/18/23 1341  Temp    Pulse 70 02/18/23 1342  Resp 21 02/18/23 1342  SpO2 100 % 02/18/23 1342  Vitals shown include unvalidated device data.  Last Pain:  Vitals:   02/18/23 0941  PainSc: 0-No pain      Patients Stated Pain Goal: 0 (02/18/23 0941)  Complications: No notable events documented.

## 2023-02-18 NOTE — Anesthesia Procedure Notes (Signed)
Procedure Name: Intubation Date/Time: 02/18/2023 11:39 AM  Performed by: Gus Puma, CRNAPre-anesthesia Checklist: Patient identified, Emergency Drugs available, Suction available and Patient being monitored Patient Re-evaluated:Patient Re-evaluated prior to induction Oxygen Delivery Method: Circle system utilized Preoxygenation: Pre-oxygenation with 100% oxygen Induction Type: IV induction Ventilation: Mask ventilation without difficulty Laryngoscope Size: Mac and 3 Grade View: Grade I Tube type: Oral Tube size: 7.5 mm Number of attempts: 1 Airway Equipment and Method: Stylet and Oral airway Placement Confirmation: ETT inserted through vocal cords under direct vision, positive ETCO2 and breath sounds checked- equal and bilateral Secured at: 23 cm Tube secured with: Tape Dental Injury: Teeth and Oropharynx as per pre-operative assessment

## 2023-02-18 NOTE — H&P (Signed)
History and Physical Interval Note:  02/18/2023 10:30 AM  Ronald Reeves  has presented today for surgery, with the diagnosis of ESRD.  The various methods of treatment have been discussed with the patient and family. After consideration of risks, benefits and other options for treatment, the patient has consented to  Procedure(s): SECOND STAGE RIGHT BASILIC VEIN TRANSPOSITION (Right) as a surgical intervention.  The patient's history has been reviewed, patient examined, no change in status, stable for surgery.  I have reviewed the patient's chart and labs.  Questions were answered to the patient's satisfaction.    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 male who is s/p right 1st stage BVT on 11/28/2022 by Dr. Chestine Spore.   Dialysis access hx: -left RC AVF 04/12/2021 Dr. Chestine Spore -left RC AVF revision and side branch ligation 07/15/2021 Dr. Chestine Spore -left 1st stage BVT 10/18/2021 Dr. Chestine Spore -left 2nd stage BVT 01/27/2022 Dr. Chestine Spore -evacuation left arm hematoma and repair of BVT and placement [facility] 05/01/2022 Dr. Lenell Antu -right 1st stage BVT 11/28/2022 Dr. Chestine Spore     Pt states he does not have pain/numbness in the right hand.  He states that he had a left IJ TDC placed and this had to be removed and reinserted in the right groin.  He was placed on Eliquis b/c of blood clot around the catheter.  He was started on this a little over a week ago.     Last month, he had enterococcus bacteremia and was hospitalized and on abx.  His catheter was removed due to this.  He states he is ready to have the catheter out to avoid infection.   The pt is on dialysis T/T/S at [facility] [address] location.     No Known Allergies         Current Outpatient Medications  Medication Sig Dispense Refill   albuterol (VENTOLIN HFA) 108 (90 Base) MCG/ACT inhaler Inhale 2 puffs into the lungs every 6 (six) hours as needed for wheezing or shortness of  breath.       amLODipine (NORVASC) 10 MG tablet Take 1 tablet (10 mg total) by mouth daily. 90 tablet 3   aspirin EC 81 MG tablet Take 81 mg by mouth daily.       Blood Pressure KIT Please check blood pressure every day, and keep a log. (Patient not taking: Reported on 12/17/2022) 1 kit 0   diclofenac Sodium (VOLTAREN) 1 % GEL Apply 1 Application topically 2 (two) times daily as needed (pain).       furosemide (LASIX) 40 MG tablet Take 80 mg by mouth daily.       hydrALAZINE (APRESOLINE) 25 MG tablet Take 1 tablet (25 mg total) by mouth daily. (Patient taking differently: Take 25 mg by mouth in the morning and at bedtime.)       isosorbide mononitrate (IMDUR) 30 MG 24 hr tablet Take 1 tablet (30 mg total) by mouth daily. 90 tablet 3   labetalol (NORMODYNE) 100 MG tablet Take 2 tablets (200 mg total) by mouth every morning. (Patient taking differently: Take 100 mg by mouth See admin instructions. 100 mg twice daily, except 1 tablet daily on dialysis days (taken after dialysis).) 180 tablet 3   multivitamin (RENA-VIT) TABS tablet Take 1 tablet by mouth daily.       omeprazole (PRILOSEC) 40 MG capsule  Take 1 capsule (40 mg total) by mouth daily. (Patient taking differently: Take 40 mg by mouth daily as needed (heartburn).) 30 capsule 2   oxyCODONE-acetaminophen (PERCOCET) 5-325 MG tablet Take 1 tablet by mouth every 6 (six) hours as needed for severe pain. (Patient not taking: Reported on 12/18/2022) 8 tablet 0   tamsulosin (FLOMAX) 0.4 MG CAPS capsule Take 0.4 mg by mouth 2 (two) times daily.        No current facility-administered medications for this visit.       ROS:  See HPI   Physical Exam:      Today's Vitals    01/12/23 1344  BP: 136/73  Pulse: 76  Resp: 20  Temp: 98.2 F (36.8 C)  SpO2: 97%  Weight: 145 lb (65.8 kg)  Height: 5\' 10"  (1.778 m)    Body mass index is 20.81 kg/m.     Incision:  healed Extremities:   There is a palpable right radial pulse.   Motor and sensory are  in tact.   There is a thrill/bruit present.  Access is  easily palpable     Dialysis Duplex on 01/12/2023: 0.6cm diameter 0.38-0.54 cm depth     Assessment/Plan:  This is a 67 y.o. male who is s/p: right 1st stage BVT on 11/28/2022 by Dr. Chestine Spore.   -the pt does not have evidence of steal. -pt fistula is maturing nicely and can proceed with second stage BVT.  He was recently put on Eliquis for clot on TDC.  Discussed with Dr. Randie Heinz and will schedule in about 4 weeks to allow pt to be on blood thinner for about a month prior to stopping it for surgery.   -pt's access can not be used until at least July as that will be 3 months from creation.  -if pt has tunneled catheter, this can be removed at the discretion of the dialysis center once the pt's access has been successfully cannulated to their satisfaction.  -discussed with pt that access does not last forever and will need intervention or even new access at some point.        Doreatha Massed, [facility] [phone]   Clinic MD:  Randie Heinz on call MD

## 2023-02-18 NOTE — Discharge Instructions (Signed)
[facility]  Discharge Instructions  AV Fistula or Graft Surgery for Dialysis Access  Please refer to the following instructions for your post-procedure care. Your surgeon or physician assistant will discuss any changes with you.  Activity  You may drive the day following your surgery, if you are comfortable and no longer taking prescription pain medication. Resume full activity as the soreness in your incision resolves.  Bathing/Showering  You may shower after you go home. Keep your incision dry for 48 hours. Do not soak in a bathtub, hot tub, or swim until the incision heals completely. You may not shower if you have a hemodialysis catheter.  Incision Care  Clean your incision with mild soap and water after 48 hours. Pat the area dry with a clean towel. You do not need a bandage unless otherwise instructed. Do not apply any ointments or creams to your incision. You may have skin glue on your incision. Do not peel it off. It will come off on its own in about one week. Your arm may swell a bit after surgery. To reduce swelling use pillows to elevate your arm so it is above your heart. Your doctor will tell you if you need to lightly wrap your arm with an ACE bandage.  Diet  Resume your normal diet. There are not special food restrictions following this procedure. In order to heal from your surgery, it is CRITICAL to get adequate nutrition. Your body requires vitamins, minerals, and protein. Vegetables are the best source of vitamins and minerals. Vegetables also provide the perfect balance of protein. Processed food has little nutritional value, so try to avoid this.  Medications  Resume taking all of your medications. If your incision is causing pain, you may take over-the counter pain relievers such as acetaminophen (Tylenol). If you were prescribed a stronger pain medication, please be aware these medications can cause nausea and constipation. Prevent  nausea by taking the medication with a snack or meal. Avoid constipation by drinking plenty of fluids and eating foods with high amount of fiber, such as fruits, vegetables, and grains.  Do not take Tylenol if you are taking prescription pain medications.   You can restart your Eliquis on 02/20/2023  Follow up Your surgeon may want to see you in the office following your access surgery. If so, this will be arranged at the time of your surgery.  Please call us immediately for any of the following conditions:  Increased pain, redness, drainage (pus) from your incision site Fever of 101 degrees or higher Severe or worsening pain at your incision site Hand pain or numbness.  Reduce your risk of vascular disease:  Stop smoking. If you would like help, call [facility] at 1-800-QUIT-NOW (639-622-0696) or [facility] at [phone]  Manage your cholesterol Maintain a desired weight Control your diabetes Keep your blood pressure down  Dialysis  It will take several weeks to several months for your new dialysis access to be ready for use. Your surgeon will determine when it is okay to use it. Your nephrologist will continue to direct your dialysis. You can continue to use your Permcath until your new access is ready for use.   02/18/2023 Ronald Reeves [id number] 11/08/1955  Surgeon(s): Cephus Shelling, MD  Procedure(s): SECOND STAGE RIGHT ARM BASILIC VEIN TRANSPOSITION   May stick graft immediately   May stick graft on designated area only:   x Do not stick fistula for 5 weeks    If you  have any questions, please call the office at [phone].

## 2023-02-18 NOTE — Anesthesia Postprocedure Evaluation (Signed)
Anesthesia Post Note  Patient: Ronald Reeves  Procedure(s) Performed: SECOND STAGE RIGHT ARM BASILIC VEIN TRANSPOSITION (Right: Arm Upper)     Patient location during evaluation: PACU Anesthesia Type: General Level of consciousness: awake and alert Pain management: pain level controlled Vital Signs Assessment: post-procedure vital signs reviewed and stable Respiratory status: spontaneous breathing, nonlabored ventilation, respiratory function stable and patient connected to nasal cannula oxygen Cardiovascular status: blood pressure returned to baseline and stable Postop Assessment: no apparent nausea or vomiting Anesthetic complications: no  No notable events documented.  Last Vitals:  Vitals:   02/18/23 1400 02/18/23 1415  BP: 127/71 130/65  Pulse: 66 66  Resp: (!) 21 16  Temp:    SpO2: 99% 100%    Last Pain:  Vitals:   02/18/23 0941  PainSc: 0-No pain                 Kionte Baumgardner S

## 2023-02-18 NOTE — Op Note (Signed)
    OPERATIVE NOTE  DATE: February 18, 2023  PROCEDURE: right second stage basilic vein transposition (brachiobasilic arteriovenous fistula) placement  PRE-OPERATIVE DIAGNOSIS: ESRD  POST-OPERATIVE DIAGNOSIS: same  SURGEON: Cephus Shelling, MD  ASSISTANT(S): Loel Dubonnet, PA  ANESTHESIA: regional  ESTIMATED BLOOD LOSS: <50 mL  FINDING(S): The right upper arm basilic vein was of excellent caliber and this was fully mobilized through three skip incisions.  It was transected near the antecubital fossa and passed through a new skin tunnel and a new end to end anastomosis was performed.  Great thrill at completion.  Palpable radial pulse.  An assistant was needed to mobilize the vein and for anastomosis.  SPECIMEN(S):  None  INDICATIONS:   Ronald Reeves is a 67 y.o. male who presents with ESRD and need for permanent hemodialysis access.  The patient is scheduled for right second stage basilic vein transposition.  The patient is aware the risks include but are not limited to: bleeding, infection, steal syndrome, nerve damage, ischemic monomelic neuropathy, failure to mature, and need for additional procedures.  The patient is aware of the risks of the procedure and elects to proceed forward.   DESCRIPTION: After full informed written consent was obtained from the patient, the patient was brought back to the operating room and placed supine upon the operating table.  Prior to induction, the patient received IV antibiotics.   After obtaining adequate anesthesia, the patient was then prepped and draped in the standard fashion for a right arm access procedure.  I turned my attention first to identifying the patient's brachiobasilic arteriovenous fistula.  Using SonoSite guidance, the location of this fistula was marked out on the skin.    This was an excellent caliber vein.  I made three longitudinal incisions on the medial aspect of the right upper arm.  Through these incisions I dissected out  circumferentially the basilic vein, taking care to protect the nerve.  Once the vein was fully mobilized, all side branches were ligated between silk ties.  The vein was marked for orientation.  I then used a curved tunneler to create a subcutaneous tunnel.  The patient was then given 3,000 units IV heparin.  The vein was then transected near the antecubital crease.  It was then brought to the previously created tunnel making sure to maintain proper orientation.  A primary anastomosis was then performed between the two cut ends of the vein with a running 6-0 Prolene with the help of my assistant.  Once this was done the clamps were released.  There was excellent flow through the fistula.  Hemostasis was then achieved.  The wound was irrigated.  The incisions were closed with a deep layer of 3-0 Vicryl followed by a subcutaneous 4-0 Monocryl and Dermabond.  There were no immediate complications.  COMPLICATIONS: None  CONDITION: Stable  Cephus Shelling, MD [facility] Office: [phone]   Cephus Shelling  02/18/2023, 1:25 PM

## 2023-02-19 ENCOUNTER — Encounter (HOSPITAL_COMMUNITY): Payer: Self-pay | Admitting: Vascular Surgery

## 2023-02-19 ENCOUNTER — Telehealth: Payer: Self-pay | Admitting: Physician Assistant

## 2023-02-19 NOTE — Telephone Encounter (Signed)
-----   Message from [facility] sent at 02/18/2023  1:39 PM EDT ----- S/p right second stage basilic fistula, 7/10  Please arrange follow up with PA in 2-3 wks for incision check, on [place] office day

## 2023-03-05 ENCOUNTER — Observation Stay (HOSPITAL_COMMUNITY)
Admission: EM | Admit: 2023-03-05 | Discharge: 2023-03-05 | Disposition: A | Discharge status: Home / Self Care | Payer: No Typology Code available for payment source | Attending: Internal Medicine | Admitting: Internal Medicine

## 2023-03-05 ENCOUNTER — Other Ambulatory Visit: Payer: Self-pay

## 2023-03-05 ENCOUNTER — Encounter (HOSPITAL_COMMUNITY): Payer: Self-pay | Admitting: Emergency Medicine

## 2023-03-05 ENCOUNTER — Encounter (HOSPITAL_COMMUNITY)
Admission: EM | Disposition: A | Discharge status: Home / Self Care | Payer: Self-pay | Source: Home / Self Care | Attending: Internal Medicine

## 2023-03-05 DIAGNOSIS — N186 End stage renal disease: Secondary | ICD-10-CM | POA: Diagnosis not present

## 2023-03-05 DIAGNOSIS — J449 Chronic obstructive pulmonary disease, unspecified: Secondary | ICD-10-CM | POA: Insufficient documentation

## 2023-03-05 DIAGNOSIS — Z79899 Other long term (current) drug therapy: Secondary | ICD-10-CM | POA: Diagnosis not present

## 2023-03-05 DIAGNOSIS — Z7982 Long term (current) use of aspirin: Secondary | ICD-10-CM | POA: Insufficient documentation

## 2023-03-05 DIAGNOSIS — I509 Heart failure, unspecified: Secondary | ICD-10-CM | POA: Diagnosis not present

## 2023-03-05 DIAGNOSIS — Z8616 Personal history of COVID-19: Secondary | ICD-10-CM | POA: Diagnosis not present

## 2023-03-05 DIAGNOSIS — Z992 Dependence on renal dialysis: Secondary | ICD-10-CM | POA: Insufficient documentation

## 2023-03-05 DIAGNOSIS — F17210 Nicotine dependence, cigarettes, uncomplicated: Secondary | ICD-10-CM | POA: Insufficient documentation

## 2023-03-05 DIAGNOSIS — N185 Chronic kidney disease, stage 5: Secondary | ICD-10-CM

## 2023-03-05 DIAGNOSIS — I132 Hypertensive heart and chronic kidney disease with heart failure and with stage 5 chronic kidney disease, or end stage renal disease: Secondary | ICD-10-CM | POA: Diagnosis not present

## 2023-03-05 DIAGNOSIS — T829XXA Unspecified complication of cardiac and vascular prosthetic device, implant and graft, initial encounter: Principal | ICD-10-CM

## 2023-03-05 DIAGNOSIS — T8242XA Displacement of vascular dialysis catheter, initial encounter: Secondary | ICD-10-CM | POA: Diagnosis present

## 2023-03-05 DIAGNOSIS — Z7901 Long term (current) use of anticoagulants: Secondary | ICD-10-CM | POA: Diagnosis not present

## 2023-03-05 HISTORY — PX: DIALYSIS/PERMA CATHETER INSERTION: CATH118288

## 2023-03-05 HISTORY — DX: End stage renal disease: Z99.2

## 2023-03-05 HISTORY — DX: End stage renal disease: N18.6

## 2023-03-05 LAB — COMPREHENSIVE METABOLIC PANEL
ALT: 11 U/L (ref 0–44)
AST: 28 U/L (ref 15–41)
Albumin: 3.5 g/dL (ref 3.5–5.0)
Alkaline Phosphatase: 63 U/L (ref 38–126)
Anion gap: 16 — ABNORMAL HIGH (ref 5–15)
BUN: 42 mg/dL — ABNORMAL HIGH (ref 8–23)
CO2: 21 mmol/L — ABNORMAL LOW (ref 22–32)
Calcium: 9.7 mg/dL (ref 8.9–10.3)
Chloride: 97 mmol/L — ABNORMAL LOW (ref 98–111)
Creatinine, Ser: 6.63 mg/dL — ABNORMAL HIGH (ref 0.61–1.24)
GFR, Estimated: 9 mL/min — ABNORMAL LOW (ref >60)
Glucose, Bld: 88 mg/dL (ref 70–99)
Potassium: 4.7 mmol/L (ref 3.5–5.1)
Sodium: 134 mmol/L — ABNORMAL LOW (ref 135–145)
Total Bilirubin: 0.8 mg/dL (ref 0.3–1.2)
Total Protein: 7.5 g/dL (ref 6.5–8.1)

## 2023-03-05 LAB — CBC WITH DIFFERENTIAL/PLATELET
Abs Immature Granulocytes: 0.01 10*3/uL (ref 0.00–0.07)
Basophils Absolute: 0.1 10*3/uL (ref 0.0–0.1)
Basophils Relative: 2 %
Eosinophils Absolute: 0.3 10*3/uL (ref 0.0–0.5)
Eosinophils Relative: 7 %
HCT: 37.2 % — ABNORMAL LOW (ref 39.0–52.0)
Hemoglobin: 12.1 g/dL — ABNORMAL LOW (ref 13.0–17.0)
Immature Granulocytes: 0 %
Lymphocytes Relative: 30 %
Lymphs Abs: 1.6 10*3/uL (ref 0.7–4.0)
MCH: 27.9 pg (ref 26.0–34.0)
MCHC: 32.5 g/dL (ref 30.0–36.0)
MCV: 85.9 fL (ref 80.0–100.0)
Monocytes Absolute: 0.7 10*3/uL (ref 0.1–1.0)
Monocytes Relative: 14 %
Neutro Abs: 2.4 10*3/uL (ref 1.7–7.7)
Neutrophils Relative %: 47 %
Platelets: 187 10*3/uL (ref 150–400)
RBC: 4.33 MIL/uL (ref 4.22–5.81)
RDW: 15.4 % (ref 11.5–15.5)
WBC: 5.2 10*3/uL (ref 4.0–10.5)
nRBC: 0 % (ref 0.0–0.2)

## 2023-03-05 LAB — HEPATITIS B SURFACE ANTIGEN: Hepatitis B Surface Ag: NONREACTIVE

## 2023-03-05 LAB — CBC
HCT: 35 % — ABNORMAL LOW (ref 39.0–52.0)
Hemoglobin: 11.5 g/dL — ABNORMAL LOW (ref 13.0–17.0)
MCH: 28 pg (ref 26.0–34.0)
MCHC: 32.9 g/dL (ref 30.0–36.0)
MCV: 85.4 fL (ref 80.0–100.0)
Platelets: 200 10*3/uL (ref 150–400)
RBC: 4.1 MIL/uL — ABNORMAL LOW (ref 4.22–5.81)
RDW: 15.5 % (ref 11.5–15.5)
WBC: 6.4 10*3/uL (ref 4.0–10.5)
nRBC: 0 % (ref 0.0–0.2)

## 2023-03-05 LAB — TYPE AND SCREEN
ABO/RH(D): B POS
Antibody Screen: NEGATIVE

## 2023-03-05 SURGERY — DIALYSIS/PERMA CATHETER INSERTION
Anesthesia: LOCAL | Laterality: Right

## 2023-03-05 MED ORDER — SENNOSIDES-DOCUSATE SODIUM 8.6-50 MG PO TABS: 1.0000 | ORAL_TABLET | Freq: Every evening | ORAL | Status: DC | PRN

## 2023-03-05 MED ORDER — MIDAZOLAM HCL 2 MG/2ML IJ SOLN
INTRAMUSCULAR | Status: AC
  Filled 2023-03-05: qty 2

## 2023-03-05 MED ORDER — SEVELAMER CARBONATE 800 MG PO TABS: 1600.0000 mg | ORAL_TABLET | Freq: Three times a day (TID) | ORAL | Status: DC

## 2023-03-05 MED ORDER — FENTANYL CITRATE (PF) 100 MCG/2ML IJ SOLN
INTRAMUSCULAR | Status: AC
  Filled 2023-03-05: qty 2

## 2023-03-05 MED ORDER — CHLORHEXIDINE GLUCONATE CLOTH 2 % EX PADS: 6.0000 | MEDICATED_PAD | Freq: Every day | CUTANEOUS | Status: DC

## 2023-03-05 MED ORDER — RENA-VITE PO TABS
1.0000 | ORAL_TABLET | Freq: Every day | ORAL | Status: DC
  Filled 2023-03-05: qty 1

## 2023-03-05 MED ORDER — DICLOFENAC SODIUM 1 % EX GEL
1.0000 | Freq: Two times a day (BID) | CUTANEOUS | Status: DC | PRN
  Filled 2023-03-05: qty 100

## 2023-03-05 MED ORDER — SODIUM CHLORIDE 0.9 % IV SOLN: 2.0000 g | INTRAVENOUS | Status: DC

## 2023-03-05 MED ORDER — TAMSULOSIN HCL 0.4 MG PO CAPS: 0.4000 mg | ORAL_CAPSULE | Freq: Two times a day (BID) | ORAL | Status: DC

## 2023-03-05 MED ORDER — ACETAMINOPHEN 650 MG RE SUPP: 650.0000 mg | Freq: Four times a day (QID) | RECTAL | Status: DC | PRN

## 2023-03-05 MED ORDER — APIXABAN 5 MG PO TABS: 5.0000 mg | ORAL_TABLET | Freq: Two times a day (BID) | ORAL | Status: DC

## 2023-03-05 MED ORDER — LIDOCAINE-EPINEPHRINE 1 %-1:100000 IJ SOLN
INTRAMUSCULAR | Status: AC
  Filled 2023-03-05: qty 2

## 2023-03-05 MED ORDER — PANTOPRAZOLE SODIUM 40 MG PO TBEC: 40.0000 mg | DELAYED_RELEASE_TABLET | Freq: Every day | ORAL | Status: DC

## 2023-03-05 MED ORDER — GABAPENTIN 300 MG PO CAPS: 300.0000 mg | ORAL_CAPSULE | Freq: Two times a day (BID) | ORAL | Status: DC

## 2023-03-05 MED ORDER — ALBUTEROL SULFATE (2.5 MG/3ML) 0.083% IN NEBU: 2.5000 mg | INHALATION_SOLUTION | Freq: Four times a day (QID) | RESPIRATORY_TRACT | Status: DC | PRN

## 2023-03-05 MED ORDER — HEPARIN (PORCINE) IN NACL 1000-0.9 UT/500ML-% IV SOLN
INTRAVENOUS | Status: DC | PRN
  Administered 2023-03-05: 500 mL

## 2023-03-05 MED ORDER — FUROSEMIDE 40 MG PO TABS: 40.0000 mg | ORAL_TABLET | Freq: Two times a day (BID) | ORAL | Status: DC

## 2023-03-05 MED ORDER — MIDAZOLAM HCL 2 MG/2ML IJ SOLN
INTRAMUSCULAR | Status: DC | PRN
  Administered 2023-03-05: 1 mg via INTRAVENOUS

## 2023-03-05 MED ORDER — LIDOCAINE-EPINEPHRINE 1 %-1:100000 IJ SOLN
INTRAMUSCULAR | Status: DC | PRN
  Administered 2023-03-05: 20 mL
  Administered 2023-03-05 (×2): 10 mL

## 2023-03-05 MED ORDER — HEPARIN SODIUM (PORCINE) 1000 UNIT/ML IJ SOLN
INTRAMUSCULAR | Status: AC
  Filled 2023-03-05: qty 10

## 2023-03-05 MED ORDER — FENTANYL CITRATE (PF) 100 MCG/2ML IJ SOLN
INTRAMUSCULAR | Status: DC | PRN
  Administered 2023-03-05: 25 ug via INTRAVENOUS

## 2023-03-05 MED ORDER — HEPARIN SODIUM (PORCINE) 1000 UNIT/ML IJ SOLN
INTRAMUSCULAR | Status: DC | PRN
  Administered 2023-03-05: 6200 [IU] via INTRAVENOUS

## 2023-03-05 MED ORDER — ACETAMINOPHEN 325 MG PO TABS: 650.0000 mg | ORAL_TABLET | Freq: Four times a day (QID) | ORAL | Status: DC | PRN

## 2023-03-05 MED ORDER — ISOSORBIDE MONONITRATE ER 30 MG PO TB24: 30.0000 mg | ORAL_TABLET | Freq: Every day | ORAL | Status: DC

## 2023-03-05 MED ORDER — CALCITRIOL 1 MCG/ML PO SOLN
2.0000 ug | ORAL | Status: DC
  Filled 2023-03-05: qty 2

## 2023-03-05 SURGICAL SUPPLY — 10 items
CATH ANGIO 5F BER2 65CM (CATHETERS) IMPLANT
COVER DOME SNAP 22 D (MISCELLANEOUS) IMPLANT
GUIDEWIRE ANGLED .035X150CM (WIRE) IMPLANT
KIT MICROPUNCTURE NIT STIFF (SHEATH) IMPLANT
KIT PALINDROME-P 55CM (CATHETERS) IMPLANT
PROTECTION STATION PRESSURIZED (MISCELLANEOUS) ×1
SHEATH PINNACLE 5F 10CM (SHEATH) IMPLANT
SHEATH PROBE COVER 6X72 (BAG) IMPLANT
STATION PROTECTION PRESSURIZED (MISCELLANEOUS) IMPLANT
TRAY PV CATH (CUSTOM PROCEDURE TRAY) ×1 IMPLANT

## 2023-03-05 NOTE — Op Note (Signed)
    Patient name: Ronald Reeves MRN: [id number] DOB: July 09, 1956 Sex: male  03/05/2023 Pre-operative Diagnosis: End stage renal disease Post-operative diagnosis:  Same Surgeon:  Cephus Shelling, MD Procedure Performed: 1.  Ultrasound-guided access right common femoral vein 2.  Catheter selection of IVC 3.  Placement of tunneled right common femoral vein palindrome dialysis catheter with tip in the IVC (55 cm palindrome) 4.  25 minutes of monitored moderate conscious sedation time  Indications: Patient is a 67 year old male seen in the ED today after he pulled out his right femoral tunneled dialysis catheter.  He presents for replacement after risks benefits discussed.  Findings: Ultrasound-guided access right common femoral vein.  Initially could not get the wire to advance into the IVC and had to use a Glidewire and then exchanged over a BER catheter for a stiffer wire.  I then placed the dilator peel-away sheath in the right common femoral vein after the catheter was tunneled in the right thigh.  The catheter was then placed with the tip into the IVC.  Flushed and aspirated easily.   Procedure:  The patient was identified in the holding area and taken to room 8.  The patient was then placed supine on the table and prepped and draped in the usual sterile fashion.  A time out was called.  Patient received Versed and fentanyl for conscious moderate sedation.  Vital signs were monitored including HR, RR, oxygenation, and BP.  I was present for all of moderate sedation.  Initially evaluated the right common femoral vein with ultrasound, it was patent, an image was saved.  This was accessed with an 18-gauge needle and placed a J-wire that would not advance in the iliac vein.  I then placed a short 5 French sheath in the right groin and then exchanged for a Glidewire that I advanced into the IVC and over the BER catheter I exchanged for the J-wire for more support.  I then measured a 55 cm palindrome  catheter and counterincision was made on the thigh after we anesthetized this with 1% lidocaine with epinephrine.  The catheter was then tunneled in the right thigh and I made sure the cuff was under the skin. Ultimately we then placed the dilators over the wire and the dilator peel-away sheath.  The inner dilator was removed and placed the catheter after it was tunneled through the skin with the tip into the IVC and peeled the sheath away.  This flushed and aspirated easily.  The skin incision in the groin was closed with 4-0 Monocryl and the catheter was secured with 3-0 nylons.  Dermabond was applied all sites.  Catheter was loaded with heparin according to manufacturer recommendation.       Cephus Shelling, MD [facility] [place] Office: [phone]

## 2023-03-05 NOTE — ED Notes (Signed)
Update family [phone]

## 2023-03-05 NOTE — Discharge Summary (Signed)
Name: Ronald Reeves MRN: [id number] DOB: 1955/12/20 67 y.o. PCP: Benjiman Core, MD  Date of Admission: 03/05/2023  7:28 AM Date of Discharge:  03/05/2023 Attending Physician: Dr. Antony Contras  DISCHARGE DIAGNOSIS:  Primary Problem: Displacement of vascular dialysis catheter [facility])   Hospital Problems: Principal Problem:   Displacement of vascular dialysis catheter (HCC)    DISCHARGE MEDICATIONS:   Allergies as of 03/05/2023   No Known Allergies      Medication List     STOP taking these medications    oxyCODONE 5 MG immediate release tablet Commonly known as: Roxicodone       TAKE these medications    albuterol 108 (90 Base) MCG/ACT inhaler Commonly known as: VENTOLIN HFA Inhale 2 puffs into the lungs every 6 (six) hours as needed for wheezing or shortness of breath.   amLODipine 10 MG tablet Commonly known as: NORVASC Take 1 tablet (10 mg total) by mouth daily.   apixaban 5 MG Tabs tablet Commonly known as: ELIQUIS Take 1 tablet (5 mg total) by mouth 2 (two) times daily. Start taking on: March 06, 2023   aspirin EC 81 MG tablet Take 81 mg by mouth daily.   Blood Pressure Kit Please check blood pressure every day, and keep a log.   furosemide 40 MG tablet Commonly known as: LASIX Take 40 mg by mouth 2 (two) times daily.   gabapentin 300 MG capsule Commonly known as: NEURONTIN Take 300 mg by mouth 2 (two) times daily.   hydrALAZINE 25 MG tablet Commonly known as: APRESOLINE Take 1 tablet (25 mg total) by mouth daily. What changed:  when to take this additional instructions   isosorbide mononitrate 30 MG 24 hr tablet Commonly known as: IMDUR Take 1 tablet (30 mg total) by mouth daily.   labetalol 100 MG tablet Commonly known as: NORMODYNE Take 2 tablets (200 mg total) by mouth every morning. What changed:  how much to take when to take this additional instructions   lidocaine 5 % Commonly known as: LIDODERM Place 1 patch onto the skin  daily as needed (pain). Remove & Discard patch within 12 hours or as directed by MD   multivitamin Tabs tablet Take 1 tablet by mouth daily.   omeprazole 40 MG capsule Commonly known as: PRILOSEC Take 1 capsule (40 mg total) by mouth daily. What changed:  when to take this reasons to take this   sevelamer carbonate 800 MG tablet Commonly known as: RENVELA Take 1,600 mg by mouth 3 (three) times daily with meals.   tamsulosin 0.4 MG Caps capsule Commonly known as: FLOMAX Take 0.4 mg by mouth 2 (two) times daily.   Voltaren 1 % Gel Generic drug: diclofenac Sodium Apply 1 Application topically 2 (two) times daily as needed (pain).        DISPOSITION AND FOLLOW-UP:  Ronald Reeves was discharged from [facility] in Stable condition. At the hospital follow up visit please address:  Tunneled dialysis catheter TDC replaced by vascular on 7/25 - no complications and the patient was not having any pain Advised to HOLD eliquis on 7/25 and restart on 7/26 ESRD on HD Patient was supposed to get HD tonight, however, he wished to be discharged to home Advised to go to outpatient HD tomorrow morning (his slot was cancelled, but Dr. Kathrene Bongo recommended he show up and they will squeeze him in)  Follow-up Recommendations: Consults: Nephro Labs: Basic Metabolic Profile Studies: None New Medications: None  Follow-up Appointments:  Follow-up Information  Benjiman Core, MD. Call.   Specialty: Internal Medicine Contact information: [address] [place] [address] [phone]                 HOSPITAL COURSE:  Patient Summary: #ABLA 2/2 Dialysis Catheter Dislodgement #ESRD on HD Patient presented this morning for vascular access problem.  He woke up and found he had his dialysis catheter pulled out and that he was bleeding profusely.  Denied chest pain, shortness of breath, and minimal lightheadedness.  Vascular surgery consulted  and patient went to Cath Lab for [facility] replacement.  Paged in the evening as patient wanted to leave for work and receive dialysis at his appointment  at 6 AM in the morning. Patient was notified that his dialysis appointment was canceled, and it was recommended for him to receive dialysis tonight or tomorrow morning inpatient.  The risks of leaving and the recommendation to remain inpatient were explained to the patient.  Patient declined, as he was feeling well and wished to be discharged to home. He denied any pain, dizziness, lightheadedness, chest pain, SOB, or any other sxs at this time. Nephrology was consulted and they recommended that the patient should call the dialysis center in the morning or rather, just show up and he should be able to be worked into their schedule to get HD.  Vascular surgery was consulted and they were also comfortable with the plan to discharge - advised that the patient does not take his eliquis tonight. At discharge, his hemoglobin was stable at 11.5, his potassium was normal at 4.7.   Chronic Problems HTN: All home meds resumed HFrEF: euvolemic on exam. Will ensure BP is normotensive. Home lasix was continued.  CAD/HLD: Continue home imdur, beta blocker, and aspirin. Hold anticoagulation until tomorrow, 7/26 GERD: Stable.  Home PPI was continued.  BPH: Stable. Tamsulosin was continued.  Chronic back pain: stable. Home gabapentin was continued.       DISCHARGE INSTRUCTIONS:   Discharge Instructions     Call MD for:  difficulty breathing, headache or visual disturbances   Complete by: As directed    Call MD for:  persistant dizziness or light-headedness   Complete by: As directed    Call MD for:  severe uncontrolled pain   Complete by: As directed    Call MD for:  temperature >100.4   Complete by: As directed    Diet - low sodium heart healthy   Complete by: As directed    Discharge instructions   Complete by: As directed    Ronald Reeves, Ronald Reeves were in the  hospital because you had to have your dialysis catheter replaced. The vascular surgeons did the procedure and there were no complications.  Please DO NOT take your eliquis tonight, and rather, just start taking it again tomorrow morning. We did not make any other changes to your medications.  The original plan was for you to get dialysis while you were in the hospital, but since you were feeling so well, you were discharged home. PLEASE be sure to go to your regular, outpatient dialysis tomorrow morning at 6:30 AM.  We also recommend you see your primary care physician for a routine follow up.   If you start to notice significant bleeding around your catheter site, if you feel dizzy/lightheaded, or if you generally start to feel ill, please return to the emergency department, as you may need dialysis sooner.   Increase activity slowly   Complete by: As directed  SUBJECTIVE:  Ronald Reeves was seen and evaluated at the bedside. He feels well and has no pain at his [facility] site. He denies any dizziness, lightheadedness, chest pain, or SOB. He is eager to be discharged and wants to go to his outpatient HD tomorrow.   Discharge Vitals:   BP (!) 160/61 (BP Location: Left Arm)   Pulse 62   Temp 98.1 F (36.7 C) (Oral)   Resp 19   Ht 5\' 10"  (1.778 m)   Wt 67 kg   SpO2 94%   BMI 21.19 kg/m   OBJECTIVE:  General: Pleasant, well-appearing middle aged male laying in bed. No acute distress. CV: RRR. Palpable radial pulses Pulmonary: Lungs CTAB. Normal effort. No wheezing or rales. Extremities: R femoral TDC clean/dry/intact Skin: Warm and dry.  Neuro: A&Ox3. Moves all extremities. Normal sensation. No focal deficit. Psych: Normal mood and affect    Pertinent Labs, Studies, and Procedures:     Latest Ref Rng & Units 03/05/2023   10:07 AM 03/05/2023    7:41 AM 02/18/2023   10:00 AM  CBC  WBC 4.0 - 10.5 K/uL 6.4  5.2    Hemoglobin 13.0 - 17.0 g/dL 81.1  91.4  78.2   Hematocrit 39.0 -  52.0 % 35.0  37.2  43.0   Platelets 150 - 400 K/uL 200  187         Latest Ref Rng & Units 03/05/2023    7:41 AM 02/18/2023   10:00 AM 12/20/2022    3:01 AM  CMP  Glucose 70 - 99 mg/dL 88  956  213   BUN 8 - 23 mg/dL 42  37  51   Creatinine 0.61 - 1.24 mg/dL 0.86  5.78  4.69   Sodium 135 - 145 mmol/L 134  137  136   Potassium 3.5 - 5.1 mmol/L 4.7  4.3  3.7   Chloride 98 - 111 mmol/L 97  102  97   CO2 22 - 32 mmol/L 21   23   Calcium 8.9 - 10.3 mg/dL 9.7   9.0   Total Protein 6.5 - 8.1 g/dL 7.5     Total Bilirubin 0.3 - 1.2 mg/dL 0.8     Alkaline Phos 38 - 126 U/L 63     AST 15 - 41 U/L 28     ALT 0 - 44 U/L 11       PERIPHERAL VASCULAR CATHETERIZATION  Result Date: 03/05/2023 Images from the original result were not included.   Patient name: Ronald Reeves        MRN: [id number]        DOB: [birth year]          Sex: male  03/05/2023 Pre-operative Diagnosis: End stage renal disease Post-operative diagnosis:  Same Surgeon:  Cephus Shelling, MD Procedure Performed: 1.  Ultrasound-guided access right common femoral vein 2.  Catheter selection of IVC 3.  Placement of tunneled right common femoral vein palindrome dialysis catheter with tip in the IVC (55 cm palindrome) 4.  25 minutes of monitored moderate conscious sedation time  Indications: Patient is a 67 year old male seen in the ED today after he pulled out his right femoral tunneled dialysis catheter.  He presents for replacement after risks benefits discussed.  Findings: Ultrasound-guided access right common femoral vein.  Initially could not get the wire to advance into the IVC and had to use a Glidewire and then exchanged over a BER catheter for a stiffer wire.  I then placed the  dilator peel-away sheath in the right common femoral vein after the catheter was tunneled in the right thigh.  The catheter was then placed with the tip into the IVC.  Flushed and aspirated easily.             Procedure:  The patient was identified in the holding  area and taken to room 8.  The patient was then placed supine on the table and prepped and draped in the usual sterile fashion.  A time out was called.  Patient received Versed and fentanyl for conscious moderate sedation.  Vital signs were monitored including HR, RR, oxygenation, and BP.  I was present for all of moderate sedation.  Initially evaluated the right common femoral vein with ultrasound, it was patent, an image was saved.  This was accessed with an 18-gauge needle and placed a J-wire that would not advance in the iliac vein.  I then placed a short 5 French sheath in the right groin and then exchanged for a Glidewire that I advanced into the IVC and over the BER catheter I exchanged for the J-wire for more support.  I then measured a 55 cm palindrome catheter and counterincision was made on the thigh after we anesthetized this with 1% lidocaine with epinephrine.  The catheter was then tunneled in the right thigh and I made sure the cuff was under the skin. Ultimately we then placed the dilators over the wire and the dilator peel-away sheath.  The inner dilator was removed and placed the catheter after it was tunneled through the skin with the tip into the IVC and peeled the sheath away.  This flushed and aspirated easily.  The skin incision in the groin was closed with 4-0 Monocryl and the catheter was secured with 3-0 nylons.  Dermabond was applied all sites.  Catheter was loaded with heparin according to manufacturer recommendation.      Cephus Shelling, MD [facility] Office: [phone]     Signed: Elza Rafter, D.O.  Internal Medicine Resident, PGY-3 [facility]  Pager: [phone] 6:47 PM, 03/05/2023

## 2023-03-05 NOTE — H&P (Addendum)
Date: 03/05/2023               Patient Name:  Ronald Reeves MRN: [id number]  DOB: 05-Jan-1956 Age / Sex: 67 y.o., male   PCP: Benjiman Core, MD         Medical Service: Internal Medicine Teaching Service         Attending Physician: Dr. Reymundo Poll, MD      First Contact: Priscila Colbert Coyer, MD        Pager: [phone]        Second Contact: Morene Crocker, MD    Pager: [facility] [phone]   Chief Complaint: Dialysis catheter removed.  History of Present Illness:  Pt is a 67 year old male with PMHx of ESRD on HD TThSat, HTN, CHF and hx of polysubstance use presented to the ED after he unintentionally pulled out his dialysis cathter. He started having significant bleeding from the site and called EMS. He was supposed to get dialysis today. Pt has right arm basilic vein fistula that is maturing.    Review of Systems negative unless stated in the HPI.  In the ED, labs were obtained and showed slight anemia with stable hgb, CMP without significant electrolyte derangements as pt is ESRD on HD. Vascular surgery was consulted who recommended admission and to replace catheter today. Nephrology consulted and planning for HD after catheter placement.   Past Medical History: Past Medical History:  Diagnosis Date   Anemia    Arthritis    Back pain    Bronchitis    COPD (chronic obstructive pulmonary disease) (HCC)    Coronary artery disease    COVID    mild - flu like symptoms   Dyspnea    w/ exertion, uses inhaler   ESRD on hemodialysis (HCC)    dialysis on tues, thurs, sat at [facility]   GERD (gastroesophageal reflux disease)    not a current problem   Heart murmur    never has caused any problems   Hypertension    Myocardial infarction (HCC)    Pneumonia    x 1   Pre-diabetes    diet controlled, no meds, does not check blood sugar   Substance abuse (HCC)     Meds: Current Outpatient Medications  Medication Instructions   albuterol  (VENTOLIN HFA) 108 (90 Base) MCG/ACT inhaler 2 puffs, Inhalation, Every 6 hours PRN   amLODipine (NORVASC) 10 mg, Oral, Daily   apixaban (ELIQUIS) 5 mg, Oral, 2 times daily   aspirin EC 81 mg, Oral, Daily   Blood Pressure KIT Please check blood pressure every day, and keep a log.   diclofenac Sodium (VOLTAREN) 1 % GEL 1 Application, Topical, 2 times daily PRN   furosemide (LASIX) 40 mg, Oral, 2 times daily   gabapentin (NEURONTIN) 300 mg, Oral, 2 times daily   hydrALAZINE (APRESOLINE) 25 mg, Oral, Daily   isosorbide mononitrate (IMDUR) 30 mg, Oral, Daily   labetalol (NORMODYNE) 200 mg, Oral, BH-each morning   lidocaine (LIDODERM) 5 % 1 patch, Transdermal, Daily PRN, Remove & Discard patch within 12 hours or as directed by MD   multivitamin (RENA-VIT) TABS tablet 1 tablet, Oral, Daily   omeprazole (PRILOSEC) 40 mg, Oral, Daily   oxyCODONE (ROXICODONE) 5 mg, Oral, Every 6 hours PRN   sevelamer carbonate (RENVELA) 1,600 mg, Oral, 3 times daily with meals   tamsulosin (FLOMAX) 0.4 mg, Oral, 2 times daily    Allergies: Allergies as of 03/05/2023   (No Known  Allergies)    Past Surgical History: Past Surgical History:  Procedure Laterality Date   AV FISTULA PLACEMENT Left 04/12/2021   Procedure: LEFT ARM ARTERIOVENOUS (AV) FISTULA CREATION;  Surgeon: Cephus Shelling, MD;  Location: [facility] OR;  Service: Vascular;  Laterality: Left;   AV FISTULA PLACEMENT Left 10/18/2021   Procedure: LEFT ARM BRACHIOBASILIC FISTULA CREATION FIRST STAGE;  Surgeon: Cephus Shelling, MD;  Location: [facility] OR;  Service: Vascular;  Laterality: Left;   AV FISTULA PLACEMENT Right 11/28/2022   Procedure: RIGHT ARM FIRST STAGE BRACHIOBASILIC FISTULA CREATION;  Surgeon: Cephus Shelling, MD;  Location: [facility] OR;  Service: Vascular;  Laterality: Right;   BACK SURGERY     BASCILIC VEIN TRANSPOSITION Left 01/27/2022   Procedure: LEFT SECOND STAGE BASILIC VEIN TRANSPOSITION;  Surgeon: Cephus Shelling, MD;  Location: [facility]  OR;  Service: Vascular;  Laterality: Left;   BASCILIC VEIN TRANSPOSITION Right 02/18/2023   Procedure: SECOND STAGE RIGHT ARM BASILIC VEIN TRANSPOSITION;  Surgeon: Cephus Shelling, MD;  Location: [facility] OR;  Service: Vascular;  Laterality: Right;   BIOPSY  08/20/2022   Procedure: BIOPSY;  Surgeon: Shellia Cleverly, DO;  Location: [facility];  Service: Gastroenterology;;   COLONOSCOPY WITH PROPOFOL N/A 08/20/2022   Procedure: COLONOSCOPY WITH PROPOFOL;  Surgeon: Shellia Cleverly, DO;  Location: [facility];  Service: Gastroenterology;  Laterality: N/A;   ESOPHAGOGASTRODUODENOSCOPY (EGD) WITH PROPOFOL N/A 08/20/2022   Procedure: ESOPHAGOGASTRODUODENOSCOPY (EGD) WITH PROPOFOL;  Surgeon: Shellia Cleverly, DO;  Location: [facility];  Service: Gastroenterology;  Laterality: N/A;   INSERTION OF DIALYSIS CATHETER Right 05/01/2022   Procedure: INSERTION OF DIALYSIS CATHETER;  Surgeon: [name], MD;  Location: [facility] OR;  Service: Vascular;  Laterality: Right;   IR REMOVAL TUN CV CATH W/O FL  12/19/2022   LIGATION OF COMPETING BRANCHES OF ARTERIOVENOUS FISTULA Left 07/15/2021   Procedure: LIGATION OF COMPETING BRANCHES OF LEFT RADIOCEPHALIC ARTERIOVENOUS FISTULA TIMES TWO;  Surgeon: Cephus Shelling, MD;  Location: [facility] OR;  Service: Vascular;  Laterality: Left;   POLYPECTOMY  08/20/2022   Procedure: POLYPECTOMY;  Surgeon: Shellia Cleverly, DO;  Location: [facility];  Service: Gastroenterology;;   REVISON OF ARTERIOVENOUS FISTULA Left 07/15/2021   Procedure: REVISON OF LEFT ARTERIOVENOUS FISTULA;  Surgeon: Cephus Shelling, MD;  Location: [facility] OR;  Service: Vascular;  Laterality: Left;   REVISON OF ARTERIOVENOUS FISTULA Left 05/01/2022   Procedure: ARTERIOVENOUS FISTULA WASHOUT OF ARM HEMATOMA;  Surgeon: [name], MD;  Location: [facility] OR;  Service: Vascular;  Laterality: Left;    Family History:  Family History  Problem Relation Age of Onset   Heart disease Mother    Heart attack  Sister 59    Social History:  Lives with wife and kids. EtOH-   one beer daily Illicit drug use- last use last week. Uses cocaine.  IADLs/ADLs- can person independently at baseline   Physical Exam: Blood pressure 131/70, pulse 74, temperature 98.4 F (36.9 C), temperature source Oral, resp. rate 14, height 5\' 10"  (1.778 m), weight 67 kg, SpO2 100%. General: NAD, sleeping initially, laying in comfortably in bed HENT: NCAT Lungs: CTAB Cardiovascular: NSR, good pulses  Abdomen: no TTP, normal bowel sounds MSK: No asymmetry Skin: no lesions Neuro: alert and oriented x4 Psych: normal mood and normal affect  Diagnostics:     Latest Ref Rng & Units 03/05/2023   10:07 AM 03/05/2023    7:41 AM 02/18/2023   10:00 AM  CBC  WBC 4.0 -  10.5 K/uL 6.4  5.2    Hemoglobin 13.0 - 17.0 g/dL 16.1  09.6  04.5   Hematocrit 39.0 - 52.0 % 35.0  37.2  43.0   Platelets 150 - 400 K/uL 200  187         Latest Ref Rng & Units 03/05/2023    7:41 AM 02/18/2023   10:00 AM 12/20/2022    3:01 AM  CMP  Glucose 70 - 99 mg/dL 88  409  811   BUN 8 - 23 mg/dL 42  37  51   Creatinine 0.61 - 1.24 mg/dL 9.14  7.82  9.56   Sodium 135 - 145 mmol/L 134  137  136   Potassium 3.5 - 5.1 mmol/L 4.7  4.3  3.7   Chloride 98 - 111 mmol/L 97  102  97   CO2 22 - 32 mmol/L 21   23   Calcium 8.9 - 10.3 mg/dL 9.7   9.0   Total Protein 6.5 - 8.1 g/dL 7.5     Total Bilirubin 0.3 - 1.2 mg/dL 0.8     Alkaline Phos 38 - 126 U/L 63     AST 15 - 41 U/L 28     ALT 0 - 44 U/L 11       EKG: personally reviewed my interpretation is NSR with no ST changes.    Assessment & Plan by Problem: Present on Admission:  Displacement of vascular dialysis catheter (HCC)   ABLA 2/2 Dialysis Catheter Dislodgement  ESRD on HD Stable. Hgb is currently stable and no signs of active bleed. Vascular to place new catheter today. Plan for in-house HD tonight or tomorrow. Hgb overall stable. Baseline around 13 and this am with hgb 11.5. He has no  active bleeding. Vascular surgery planned to place the catheter today. Nephrology's plan is to perform HD tonight after catheter placement. Will start diet after the procedure. Hgb still at goal >10.  Continue phos binder.   HD catheter site infection: Noted during OP HD and worked up OP. Will need IV abx per nephrology during dialysis sessions. Pt is afebrile, without leukocytosis or other symptoms. Review of Bcx showed NGTD.   Chronic Problems HTN: On multiple HTN meds and all were held given pt was normotensive and NPO.  Thrombosis 2/2 TDC: On eliquis. Will hold until procedure.  HFrEF: euvolemic on exam. Will ensure BP is normotensive. Home lasix was continued.  Polysubstance use: Will counsel on substance use at next visit. States last cocaine use was last week.  CAD/HLD: Continue home imdur. Hold beta blocker, aspirin and AC.  GERD: Stable.  Home PPI was continued.  BPH: Stable. Tamsulosin was continued.  Chronic back pain: stable. Home gabapentin was continued.    DVT prophx: SCD Diet: NPO Bowel: PRN Code: Full  Prior to Admission Living Arrangement: Home Anticipated Discharge Location: Home Barriers to Discharge: Medical Workup  Dispo: Admit patient to Observation with expected length of stay less than 2 midnights.  Gwenevere Abbot, MD Eligha Bridegroom. [facility] Internal Medicine Residency, PGY-3 Pager: [phone] After 5 pm or weekends:  1st Contact: Pager: [phone]  2nd Contact: Pager: [phone]

## 2023-03-05 NOTE — ED Triage Notes (Signed)
Pt BIB [facility] from home due to catheter coming out.  Pt has port on right upper thigh and was going to the bathroom this AM when he noticed that catheter was on the floor and blood was dripping down leg.  Fire put pressure dressing on area.  Pt has been getting dialyzed for the past 2 years; Tuesday, Thursday and Saturday.  Pt does take eliquis.  VS BP 140/80

## 2023-03-05 NOTE — Consult Note (Signed)
Renal Service Consult Note [facility]  Cauy Melody 03/05/2023 [name], MD Requesting Physician: Dr. Antony Contras  Reason for Consult: ESRD pt w/ dislodged HD catheter HPI: The patient is a 67 y.o. year-old w/ PMH as below who presented to ED this am after his R groin catheter dislodged while he was sleeping overnight last night. Had some blood loss, EMS brought to ED where VSS and labs were wnl for esrd. VVS called and are planning to replace the [facility] later today. We are asked to see for dialysis.   Pt seen in ED, no c/o's at this time. No recent chills, fevers, cough or wt loss/ fatigue / loss of appetite. Goes to HD regularly and usual stays on the full time.    Pt pt's OP HD unit, he had mild exit site infection on 7/16 w/o systemic symptoms. Exit site cx's were sent and grew "mult species, prob contaminant". One week later on 7/23, blood cx's were sent and he was started on IV cefepime to run for a 2 wk course. The blood cx's are negative to date. He did not have a fever that day.    ROS - denies CP, no joint pain, no HA, no blurry vision, no rash, no diarrhea, no nausea/ vomiting, no dysuria, no difficulty voiding   Past Medical History  Past Medical History:  Diagnosis Date   Anemia    Arthritis    Back pain    Bronchitis    Chronic kidney disease    dialysis on tues, thurs, sat   COPD (chronic obstructive pulmonary disease) (HCC)    Coronary artery disease    COVID    mild - flu like symptoms   Dyspnea    w/ exertion, uses inhaler   GERD (gastroesophageal reflux disease)    not a current problem   Heart murmur    never has caused any problems   Hypertension    Myocardial infarction (HCC)    Pneumonia    x 1   Pre-diabetes    diet controlled, no meds, does not check blood sugar   Substance abuse (HCC)    Past Surgical History  Past Surgical History:  Procedure Laterality Date   AV FISTULA PLACEMENT Left 04/12/2021   Procedure: LEFT ARM  ARTERIOVENOUS (AV) FISTULA CREATION;  Surgeon: Cephus Shelling, MD;  Location: [facility] OR;  Service: Vascular;  Laterality: Left;   AV FISTULA PLACEMENT Left 10/18/2021   Procedure: LEFT ARM BRACHIOBASILIC FISTULA CREATION FIRST STAGE;  Surgeon: Cephus Shelling, MD;  Location: [facility] OR;  Service: Vascular;  Laterality: Left;   AV FISTULA PLACEMENT Right 11/28/2022   Procedure: RIGHT ARM FIRST STAGE BRACHIOBASILIC FISTULA CREATION;  Surgeon: Cephus Shelling, MD;  Location: [facility] OR;  Service: Vascular;  Laterality: Right;   BACK SURGERY     BASCILIC VEIN TRANSPOSITION Left 01/27/2022   Procedure: LEFT SECOND STAGE BASILIC VEIN TRANSPOSITION;  Surgeon: Cephus Shelling, MD;  Location: [facility] OR;  Service: Vascular;  Laterality: Left;   BASCILIC VEIN TRANSPOSITION Right 02/18/2023   Procedure: SECOND STAGE RIGHT ARM BASILIC VEIN TRANSPOSITION;  Surgeon: Cephus Shelling, MD;  Location: [facility] OR;  Service: Vascular;  Laterality: Right;   BIOPSY  08/20/2022   Procedure: BIOPSY;  Surgeon: Shellia Cleverly, DO;  Location: [facility];  Service: Gastroenterology;;   COLONOSCOPY WITH PROPOFOL N/A 08/20/2022   Procedure: COLONOSCOPY WITH PROPOFOL;  Surgeon: Shellia Cleverly, DO;  Location: [facility];  Service: Gastroenterology;  Laterality: N/A;  ESOPHAGOGASTRODUODENOSCOPY (EGD) WITH PROPOFOL N/A 08/20/2022   Procedure: ESOPHAGOGASTRODUODENOSCOPY (EGD) WITH PROPOFOL;  Surgeon: Shellia Cleverly, DO;  Location: [facility];  Service: Gastroenterology;  Laterality: N/A;   INSERTION OF DIALYSIS CATHETER Right 05/01/2022   Procedure: INSERTION OF DIALYSIS CATHETER;  Surgeon: [name], MD;  Location: [facility] OR;  Service: Vascular;  Laterality: Right;   IR REMOVAL TUN CV CATH W/O FL  12/19/2022   LIGATION OF COMPETING BRANCHES OF ARTERIOVENOUS FISTULA Left 07/15/2021   Procedure: LIGATION OF COMPETING BRANCHES OF LEFT RADIOCEPHALIC ARTERIOVENOUS FISTULA TIMES TWO;  Surgeon: Cephus Shelling, MD;   Location: [facility] OR;  Service: Vascular;  Laterality: Left;   POLYPECTOMY  08/20/2022   Procedure: POLYPECTOMY;  Surgeon: Shellia Cleverly, DO;  Location: [facility];  Service: Gastroenterology;;   REVISON OF ARTERIOVENOUS FISTULA Left 07/15/2021   Procedure: REVISON OF LEFT ARTERIOVENOUS FISTULA;  Surgeon: Cephus Shelling, MD;  Location: [facility] OR;  Service: Vascular;  Laterality: Left;   REVISON OF ARTERIOVENOUS FISTULA Left 05/01/2022   Procedure: ARTERIOVENOUS FISTULA WASHOUT OF ARM HEMATOMA;  Surgeon: [name], MD;  Location: [facility] OR;  Service: Vascular;  Laterality: Left;   Family History  Family History  Problem Relation Age of Onset   Heart disease Mother    Heart attack Sister 21   Social History  reports that he has been smoking cigars and cigarettes. He has been exposed to tobacco smoke. He has never used smokeless tobacco. He reports current alcohol use of about 14.0 standard drinks of alcohol per week. He reports current drug use. Frequency: 1.00 time per week. Drugs: Marijuana and Cocaine. Allergies No Known Allergies Home medications Prior to Admission medications   Medication Sig Start Date End Date Taking? Authorizing Provider  albuterol (VENTOLIN HFA) 108 (90 Base) MCG/ACT inhaler Inhale 2 puffs into the lungs every 6 (six) hours as needed for wheezing or shortness of breath.    [provider]  amLODipine (NORVASC) 10 MG tablet Take 1 tablet (10 mg total) by mouth daily. 01/01/18   Lewayne Bunting, MD  apixaban (ELIQUIS) 5 MG TABS tablet Take 5 mg by mouth 2 (two) times daily. 12/31/22   [provider]  aspirin EC 81 MG tablet Take 81 mg by mouth daily.    [provider]  Blood Pressure KIT Please check blood pressure every day, and keep a log. 12/07/22   Arrien, York Ram, MD  diclofenac Sodium (VOLTAREN) 1 % GEL Apply 1 Application topically 2 (two) times daily as needed (pain).    [provider]  furosemide (LASIX) 40 MG  tablet Take 40 mg by mouth 2 (two) times daily.    [provider]  gabapentin (NEURONTIN) 300 MG capsule Take 300 mg by mouth 2 (two) times daily. 12/31/22   [provider]  hydrALAZINE (APRESOLINE) 25 MG tablet Take 1 tablet (25 mg total) by mouth daily. Patient taking differently: Take 25 mg by mouth See admin instructions. 25 mg twice daily, except 1 tablet daily on dialysis days (taken after dialysis). 12/07/22   Arrien, York Ram, MD  isosorbide mononitrate (IMDUR) 30 MG 24 hr tablet Take 1 tablet (30 mg total) by mouth daily. 01/01/18   Lewayne Bunting, MD  labetalol (NORMODYNE) 100 MG tablet Take 2 tablets (200 mg total) by mouth every morning. Patient taking differently: Take 100 mg by mouth See admin instructions. 100 mg twice daily, except 1 tablet daily on dialysis days (taken after dialysis). 01/01/18  Lewayne Bunting, MD  lidocaine (LIDODERM) 5 % Place 1 patch onto the skin daily as needed (pain). Remove & Discard patch within 12 hours or as directed by MD    [provider]  multivitamin (RENA-VIT) TABS tablet Take 1 tablet by mouth daily.    [provider]  omeprazole (PRILOSEC) 40 MG capsule Take 1 capsule (40 mg total) by mouth daily. Patient taking differently: Take 40 mg by mouth 2 (two) times daily. 06/06/22   Arnaldo Natal, NP  oxyCODONE (ROXICODONE) 5 MG immediate release tablet Take 1 tablet (5 mg total) by mouth every 6 (six) hours as needed for severe pain. 02/18/23   Schuh, McKenzi P, PA-C  sevelamer carbonate (RENVELA) 800 MG tablet Take 1,600 mg by mouth 3 (three) times daily with meals. 01/06/23   [provider]  tamsulosin (FLOMAX) 0.4 MG CAPS capsule Take 0.4 mg by mouth 2 (two) times daily.    [provider]     Vitals:   03/05/23 0745 03/05/23 0745 03/05/23 0945 03/05/23 1100  BP: 125/66 129/70 (!) 140/75 (!) 112/97  Pulse: 65 68 67 65  Resp: 12 20 20 20   Temp:  98 F (36.7 C)  98.1 F  (36.7 C)  TempSrc:  Oral  Oral  SpO2: 100% 99% 100% 100%  Weight:      Height:       Exam Gen alert, no distress, slim AAM No rash, cyanosis or gangrene Sclera anicteric, throat clear  No jvd or bruits Chest clear bilat to bases, no rales/ wheezing RRR no MRG Abd soft ntnd no mass or ascites +bs GU nl male MS no joint effusions or deformity Ext no LE or UE edema, no wounds or ulcers Neuro is alert, Ox 3 , nf    No HD access in place      Home meds include - albuterol, amlodipine, apixaban, aspirin, voltaren gel, furosemide 40 bid, gabapentin 300 bid, hydralazine 25 1-2 per day, imdur 30 every day, labetalol 100 bid, emla cream, renavite, omeprazole, oxycodone prn, sevelamer carb 2 ac tid, tamsulosin     OP HD:  [facility]  4h  400/1.5   63.5kg  2K/3Ca bath  R fem TDC (now out)  heparin 5000 + 2500 midrun  - last OP HD 7/23, post wt 64.2kg - rocaltrol 2.0 mcg po three times per week - venofer 100mg  q hd IV thru 8/08 - cefepime 2 gm IV w/ each HD 7/23- 8/03   Assessment/ Plan: Dislodged HD cath - VVS consulted and are planning to replace catheter.  Exit site infection - started 7/16 and not better on 7/23 so he was started on IV abx course of cefepime to run 7/23- 8/03 (2 wks). Exit site cx from 7/16 grew "mult species, likely contaminant", and blood cx's from 7/23 were negative. Pt did not have symptoms and blood cx's were negative, so it is okay to place a new TDC today.  ESRD - on HD TTS. Plan HD after access replaced, later today or tonight, or tomorrow am would be okay also. Labs and volume are stable, there is no emergent need.  HTN/ volume - no vol excess on exam. Get wt's pre HD Anemia esrd - Hb 11-13 here, no esa needs. Hold IV Fe w/ infection.  MBD ckd - Ca in range, cont po vdra and renvela as binder.     Vinson Moselle  MD [facility] 03/05/2023, 2:05 PM  Recent Labs  Lab 03/05/23 0741 03/05/23  1007  HGB 12.1* 11.5*  ALBUMIN 3.5  --   CALCIUM 9.7  --   CREATININE  6.63*  --   K 4.7  --    Inpatient medications:   acetaminophen **OR** acetaminophen, senna-docusate

## 2023-03-05 NOTE — ED Notes (Signed)
Help get patient cleaned up off the bedpan patient is resting with csll bell in reach and family at bedside

## 2023-03-05 NOTE — Progress Notes (Addendum)
Contacted by nephrologist regarding pt's need for out-pt HD tomorrow to make-up today's missed treatment. Contacted [facility] to inquire about an appt for tomorrow. Clinic can treat pt tomorrow. Pt will need to arrive at 6:40 am for 7:00 am chair time. This info was provided to nephrologist and will add to AVS as well.   Olivia Canter Renal Navigator [phone]  Addendum at 3:52 pm: Case discussed with nephrologist. Pt's appt for out-pt HD at clinic tomorrow needs to be canceled per nephrologist. Contacted [facility] to advise staff to cancel appt.

## 2023-03-05 NOTE — ED Notes (Signed)
Pt placed on cardiac monitor in hallway

## 2023-03-05 NOTE — ED Provider Notes (Signed)
[place] EMERGENCY DEPARTMENT AT [facility] Provider Note   CSN: [id number] Arrival date & time: 03/05/23  6578     History  No chief complaint on file.   Ronald Reeves is a 67 y.o. male.  The history is provided by the patient, medical records and the EMS personnel. No language interpreter was used.  Wound Check This is a new problem. The problem occurs constantly. The problem has been rapidly improving. Pertinent negatives include no chest pain, no abdominal pain, no headaches and no shortness of breath. Nothing aggravates the symptoms. Nothing relieves the symptoms. He has tried nothing for the symptoms. The treatment provided no relief.       Home Medications Prior to Admission medications   Medication Sig Start Date End Date Taking? Authorizing Provider  albuterol (VENTOLIN HFA) 108 (90 Base) MCG/ACT inhaler Inhale 2 puffs into the lungs every 6 (six) hours as needed for wheezing or shortness of breath.    [provider]  amLODipine (NORVASC) 10 MG tablet Take 1 tablet (10 mg total) by mouth daily. 01/01/18   Lewayne Bunting, MD  apixaban (ELIQUIS) 5 MG TABS tablet Take 5 mg by mouth 2 (two) times daily. 12/31/22   [provider]  aspirin EC 81 MG tablet Take 81 mg by mouth daily.    [provider]  Blood Pressure KIT Please check blood pressure every day, and keep a log. 12/07/22   Arrien, York Ram, MD  diclofenac Sodium (VOLTAREN) 1 % GEL Apply 1 Application topically 2 (two) times daily as needed (pain).    [provider]  furosemide (LASIX) 40 MG tablet Take 40 mg by mouth 2 (two) times daily.    [provider]  gabapentin (NEURONTIN) 300 MG capsule Take 300 mg by mouth 2 (two) times daily. 12/31/22   [provider]  hydrALAZINE (APRESOLINE) 25 MG tablet Take 1 tablet (25 mg total) by mouth daily. Patient taking differently: Take 25 mg by mouth See admin instructions. 25 mg twice daily, except 1  tablet daily on dialysis days (taken after dialysis). 12/07/22   Arrien, York Ram, MD  isosorbide mononitrate (IMDUR) 30 MG 24 hr tablet Take 1 tablet (30 mg total) by mouth daily. 01/01/18   Lewayne Bunting, MD  labetalol (NORMODYNE) 100 MG tablet Take 2 tablets (200 mg total) by mouth every morning. Patient taking differently: Take 100 mg by mouth See admin instructions. 100 mg twice daily, except 1 tablet daily on dialysis days (taken after dialysis). 01/01/18   Lewayne Bunting, MD  lidocaine (LIDODERM) 5 % Place 1 patch onto the skin daily as needed (pain). Remove & Discard patch within 12 hours or as directed by MD    [provider]  multivitamin (RENA-VIT) TABS tablet Take 1 tablet by mouth daily.    [provider]  omeprazole (PRILOSEC) 40 MG capsule Take 1 capsule (40 mg total) by mouth daily. Patient taking differently: Take 40 mg by mouth 2 (two) times daily. 06/06/22   Arnaldo Natal, NP  oxyCODONE (ROXICODONE) 5 MG immediate release tablet Take 1 tablet (5 mg total) by mouth every 6 (six) hours as needed for severe pain. 02/18/23   Schuh, McKenzi P, PA-C  sevelamer carbonate (RENVELA) 800 MG tablet Take 1,600 mg by mouth 3 (three) times daily with meals. 01/06/23   [provider]  tamsulosin (FLOMAX) 0.4 MG CAPS capsule Take 0.4 mg by mouth 2 (two) times daily.    [provider]      Allergies    Patient has no known allergies.    Review of Systems   Review of Systems  Constitutional:  Positive for fatigue. Negative for chills and fever.  HENT:  Negative for congestion.   Eyes:  Negative for visual disturbance.  Respiratory:  Negative for cough, chest tightness, shortness of breath and wheezing.   Cardiovascular:  Negative for chest pain.  Gastrointestinal:  Negative for abdominal pain, constipation, diarrhea, nausea and vomiting.  Genitourinary:  Negative for dysuria, flank pain and frequency.  Musculoskeletal:  Negative  for back pain, neck pain and neck stiffness.  Skin:  Negative for rash and wound.  Neurological:  Positive for light-headedness. Negative for dizziness, weakness and headaches.  Psychiatric/Behavioral:  Negative for agitation and confusion.   All other systems reviewed and are negative.   Physical Exam Updated Vital Signs Ht 5\' 10"  (1.778 m)   Wt 67 kg   BMI 21.19 kg/m  Physical Exam Vitals and nursing note reviewed.  Constitutional:      General: He is not in acute distress.    Appearance: He is well-developed. He is not ill-appearing, toxic-appearing or diaphoretic.  HENT:     Head: Normocephalic and atraumatic.     Nose: No congestion or rhinorrhea.     Mouth/Throat:     Mouth: Mucous membranes are moist.     Pharynx: No oropharyngeal exudate or posterior oropharyngeal erythema.  Eyes:     Conjunctiva/sclera: Conjunctivae normal.     Pupils: Pupils are equal, round, and reactive to light.  Cardiovascular:     Rate and Rhythm: Regular rhythm. Tachycardia present.     Heart sounds: No murmur heard. Pulmonary:     Effort: Pulmonary effort is normal. No respiratory distress.     Breath sounds: Normal breath sounds.  Abdominal:     Palpations: Abdomen is soft.     Tenderness: There is no abdominal tenderness. There is no guarding or rebound.  Musculoskeletal:        General: No swelling or tenderness.     Cervical back: Neck supple. No tenderness.  Skin:    General: Skin is warm and dry.     Capillary Refill: Capillary refill takes less than 2 seconds.     Findings: No erythema or rash.  Neurological:     General: No focal deficit present.     Mental Status: He is alert.     Motor: No weakness.  Psychiatric:        Mood and Affect: Mood normal.     ED Results / Procedures / Treatments   Labs (all labs ordered are listed, but only abnormal results are displayed) Labs Reviewed  CBC WITH DIFFERENTIAL/PLATELET - Abnormal; Notable for the following components:       Result Value   Hemoglobin 12.1 (*)    HCT 37.2 (*)    All other components within normal limits  COMPREHENSIVE METABOLIC PANEL - Abnormal; Notable for the following components:   Sodium 134 (*)    Chloride 97 (*)    CO2 21 (*)    BUN 42 (*)    Creatinine, Ser 6.63 (*)    GFR, Estimated 9 (*)    Anion gap 16 (*)    All other components within normal limits  CBC - Abnormal; Notable for the following components:   RBC 4.10 (*)    Hemoglobin 11.5 (*)    HCT 35.0 (*)    All other components within  normal limits  TYPE AND SCREEN    EKG EKG Interpretation Date/Time:  Thursday March 05 2023 07:51:28 EDT Ventricular Rate:  67 PR Interval:  200 QRS Duration:  111 QT Interval:  460 QTC Calculation: 486 R Axis:      Text Interpretation: Normal sinus rhythm overall similar to prior No STEMI Confirmed by Theda Belfast ([id number]) on 03/05/2023 7:54:40 AM  Radiology No results found.  Procedures Procedures    Medications Ordered in ED Medications  acetaminophen (TYLENOL) tablet 650 mg (has no administration in time range)    Or  acetaminophen (TYLENOL) suppository 650 mg (has no administration in time range)  senna-docusate (Senokot-S) tablet 1 tablet (has no administration in time range)    ED Course/ Medical Decision Making/ A&P                             Medical Decision Making Amount and/or Complexity of Data Reviewed Labs: ordered.  Risk Decision regarding hospitalization.    Ronald Reeves is a 67 y.o. male with a past medical history significant for hypertension, polysubstance abuse, CAD, GERD, ESRD on dialysis TTS, COPD, and arthritis who presents for vascular access problem.  According to patient and EMS, he woke up this morning and his dialysis catheter had been pulled out and was sitting on the floor and patient was bleeding profusely reportedly.  Patient reports he is not having chest pain or shortness of breath but does feel some lightheadedness and fatigue.  He is  also feeling some tingling in his replaced of his body.  He quickly called EMS and they were able to come and apply a pressure dressing.  They then transported him.  His vital signs did not reveal significant tachycardia or hypotension and patient reports she was otherwise feeling well before this problem overnight.  Currently patient is denying chest pain, shortness breath, palpitations, nausea, vomiting, constipation, diarrhea, or urinary changes.  He is denying any weakness and reports he had some tingling of the right leg as the dressing was placed but after taking it down it is improving.  On exam, patient has palpable DP and PT pulses.  He also has a femoral pulse on the right side.  A dressing was removed and he has a puncture wound that is hemostatic at this time.  I do not feel a pulsatile bulge or other tactile abnormality.  There is no erythema, warmth, or crepitance.  Otherwise exam is reassuring.  Abdomen nontender, chest nontender.  Lungs clear.  Patient resting and conversing normally.  As patient is scheduled for dialysis today but now has no access, we will get EKG and some screening labs.  Due to his blood loss and feeling lightheaded and fatigued, will get a type and screen and some blood work.  Anticipate discussion with either nephrology or vascular to determine disposition for the patient as he cannot get dialysis today.       7:48 AM Spoke to Dr. Chales Abrahams with nephrology who does feel the patient needs his dialysis access obtained again.  He requested we contact vascular surgery to see if they can obtain access today and potentially get dialyzed.  He anticipate the patient will likely need admission based on this presentation and situation now.  We will get labs and then talk to vascular surgery.  8:46 AM Spoke to the vascular team in the OR he request we reconsult the entire office group to see who can help today  with this patient.  9:43 AM To spoke to Dr. Chestine Spore who will make  sure patient gets seen.  Will keep patient n.p.o.   10:00 AM Dr. Chestine Spore with vascular surgery came to see the patient and will help facilitate access today.  He requested remaining n.p.o. and admission to medicine.  Will get the second CBC as well.         Final Clinical Impression(s) / ED Diagnoses Final diagnoses:  Complication associated with dialysis catheter    Clinical Impression: 1. Complication associated with dialysis catheter     Disposition: Admit  This note was prepared with assistance of Dragon voice recognition software. Occasional wrong-word or sound-a-like substitutions may have occurred due to the inherent limitations of voice recognition software.      Ronald Reeves, Canary Brim, MD 03/05/23 1236

## 2023-03-05 NOTE — Hospital Course (Addendum)
#  ABLA 2/2 Dialysis Catheter Dislodgement #ESRD on HD Patient presented this morning for vascular access problem.  He woke up and found he had his dialysis catheter pulled out and that he was bleeding profusely.  Denied chest pain, shortness of breath, and minimal lightheadedness.  Vascular surgery consulted and patient went to Cath Lab for [facility] replacement.  Paged in the evening as patient wanted to leave for work and receive dialysis at his appointment  at 6 AM in the morning. Patient was notified that his dialysis appointment was canceled, and it was recommended for him to receive dialysis tonight or tomorrow morning inpatient.  The risks of leaving and the recommendation to remain inpatient were explained to the patient.  Patient declined, as he was feeling well and wished to be discharged to home. He denied any pain, dizziness, lightheadedness, chest pain, SOB, or any other sxs at this time. Nephrology was consulted and they recommended that the patient should call the dialysis center in the morning or rather, just show up and he should be able to be worked into their schedule to get HD.  Vascular surgery was consulted and they were also comfortable with the plan to discharge - advised that the patient does not take his eliquis tonight. At discharge, his hemoglobin was stable at 11.5, his potassium was normal at 4.7.   Chronic Problems HTN: All home meds resumed HFrEF: euvolemic on exam. Will ensure BP is normotensive. Home lasix was continued.  CAD/HLD: Continue home imdur, beta blocker, and aspirin. Hold anticoagulation until tomorrow, 7/26 GERD: Stable.  Home PPI was continued.  BPH: Stable. Tamsulosin was continued.  Chronic back pain: stable. Home gabapentin was continued.

## 2023-03-05 NOTE — Consult Note (Signed)
Hospital Consult    Reason for Consult: Replace [facility] Referring Physician: ED MRN #:  [id number]  History of Present Illness: This is a 67 y.o. male with history of end-stage renal disease, HTN, CHF that presented to the ED after he pulled out his right femoral tunneled dialysis catheter.  Patient apparently woke up in a daze and does not remember pulling out his catheter.  He has a right arm basilic vein fistula this just underwent transposition 2 weeks ago.  This is healing without issue.  Vascular surgery was consulted to replace his TDC.  Last dialysis was on Tuesday.  States he has been holding his anticoagulation due to some bloody stool and took one dose last night otherwise no doses in last several days.  Past Medical History:  Diagnosis Date   Anemia    Arthritis    Back pain    Bronchitis    Chronic kidney disease    dialysis on tues, thurs, sat   COPD (chronic obstructive pulmonary disease) (HCC)    Coronary artery disease    COVID    mild - flu like symptoms   Dyspnea    w/ exertion, uses inhaler   GERD (gastroesophageal reflux disease)    not a current problem   Heart murmur    never has caused any problems   Hypertension    Myocardial infarction (HCC)    Pneumonia    x 1   Pre-diabetes    diet controlled, no meds, does not check blood sugar   Substance abuse (HCC)     Past Surgical History:  Procedure Laterality Date   AV FISTULA PLACEMENT Left 04/12/2021   Procedure: LEFT ARM ARTERIOVENOUS (AV) FISTULA CREATION;  Surgeon: Cephus Shelling, MD;  [facility];  Service: Vascular;  Laterality: Left;   AV FISTULA PLACEMENT Left 10/18/2021   Procedure: LEFT ARM BRACHIOBASILIC FISTULA CREATION FIRST STAGE;  Surgeon: Cephus Shelling, MD;  [facility];  Service: Vascular;  Laterality: Left;   AV FISTULA PLACEMENT Right 11/28/2022   Procedure: RIGHT ARM FIRST STAGE BRACHIOBASILIC FISTULA CREATION;  Surgeon: Cephus Shelling, MD;  [facility];   Service: Vascular;  Laterality: Right;   BACK SURGERY     BASCILIC VEIN TRANSPOSITION Left 01/27/2022   Procedure: LEFT SECOND STAGE BASILIC VEIN TRANSPOSITION;  Surgeon: Cephus Shelling, MD;  [facility];  Service: Vascular;  Laterality: Left;   BASCILIC VEIN TRANSPOSITION Right 02/18/2023   Procedure: SECOND STAGE RIGHT ARM BASILIC VEIN TRANSPOSITION;  Surgeon: Cephus Shelling, MD;  Location: [facility] OR;  Service: Vascular;  Laterality: Right;   BIOPSY  08/20/2022   Procedure: BIOPSY;  Surgeon: Shellia Cleverly, DO;  [facility];  Service: Gastroenterology;;   COLONOSCOPY WITH PROPOFOL N/A 08/20/2022   Procedure: COLONOSCOPY WITH PROPOFOL;  Surgeon: Shellia Cleverly, DO;  [facility];  Service: Gastroenterology;  Laterality: N/A;   ESOPHAGOGASTRODUODENOSCOPY (EGD) WITH PROPOFOL N/A 08/20/2022   Procedure: ESOPHAGOGASTRODUODENOSCOPY (EGD) WITH PROPOFOL;  Surgeon: Shellia Cleverly, DO;  [facility];  Service: Gastroenterology;  Laterality: N/A;   INSERTION OF DIALYSIS CATHETER Right 05/01/2022   Procedure: INSERTION OF DIALYSIS CATHETER;  Surgeon: [name], MD;  [facility];  Service: Vascular;  Laterality: Right;   IR REMOVAL TUN CV CATH W/O FL  12/19/2022   LIGATION OF COMPETING BRANCHES OF ARTERIOVENOUS FISTULA Left 07/15/2021   Procedure: LIGATION OF COMPETING BRANCHES OF LEFT RADIOCEPHALIC ARTERIOVENOUS FISTULA TIMES TWO;  Surgeon: Sherald Hess  J, MD;  [facility];  Service: Vascular;  Laterality: Left;   POLYPECTOMY  08/20/2022   Procedure: POLYPECTOMY;  Surgeon: Shellia Cleverly, DO;  [facility];  Service: Gastroenterology;;   REVISON OF ARTERIOVENOUS FISTULA Left 07/15/2021   Procedure: REVISON OF LEFT ARTERIOVENOUS FISTULA;  Surgeon: Cephus Shelling, MD;  Location: [facility] OR;  Service: Vascular;  Laterality: Left;   REVISON OF ARTERIOVENOUS FISTULA Left 05/01/2022   Procedure: ARTERIOVENOUS FISTULA WASHOUT OF ARM  HEMATOMA;  Surgeon: [name], MD;  [facility];  Service: Vascular;  Laterality: Left;    No Known Allergies  Prior to Admission medications   Medication Sig Start Date End Date Taking? Authorizing Provider  albuterol (VENTOLIN HFA) 108 (90 Base) MCG/ACT inhaler Inhale 2 puffs into the lungs every 6 (six) hours as needed for wheezing or shortness of breath.    [provider]  amLODipine (NORVASC) 10 MG tablet Take 1 tablet (10 mg total) by mouth daily. 01/01/18   Lewayne Bunting, MD  apixaban (ELIQUIS) 5 MG TABS tablet Take 5 mg by mouth 2 (two) times daily. 12/31/22   [provider]  aspirin EC 81 MG tablet Take 81 mg by mouth daily.    [provider]  Blood Pressure KIT Please check blood pressure every day, and keep a log. 12/07/22   Arrien, York Ram, MD  diclofenac Sodium (VOLTAREN) 1 % GEL Apply 1 Application topically 2 (two) times daily as needed (pain).    [provider]  furosemide (LASIX) 40 MG tablet Take 40 mg by mouth 2 (two) times daily.    [provider]  gabapentin (NEURONTIN) 300 MG capsule Take 300 mg by mouth 2 (two) times daily. 12/31/22   [provider]  hydrALAZINE (APRESOLINE) 25 MG tablet Take 1 tablet (25 mg total) by mouth daily. Patient taking differently: Take 25 mg by mouth See admin instructions. 25 mg twice daily, except 1 tablet daily on dialysis days (taken after dialysis). 12/07/22   Arrien, York Ram, MD  isosorbide mononitrate (IMDUR) 30 MG 24 hr tablet Take 1 tablet (30 mg total) by mouth daily. 01/01/18   Lewayne Bunting, MD  labetalol (NORMODYNE) 100 MG tablet Take 2 tablets (200 mg total) by mouth every morning. Patient taking differently: Take 100 mg by mouth See admin instructions. 100 mg twice daily, except 1 tablet daily on dialysis days (taken after dialysis). 01/01/18   Lewayne Bunting, MD  lidocaine (LIDODERM) 5 % Place 1 patch onto the skin daily as needed (pain).  Remove & Discard patch within 12 hours or as directed by MD    [provider]  multivitamin (RENA-VIT) TABS tablet Take 1 tablet by mouth daily.    [provider]  omeprazole (PRILOSEC) 40 MG capsule Take 1 capsule (40 mg total) by mouth daily. Patient taking differently: Take 40 mg by mouth 2 (two) times daily. 06/06/22   Arnaldo Natal, NP  oxyCODONE (ROXICODONE) 5 MG immediate release tablet Take 1 tablet (5 mg total) by mouth every 6 (six) hours as needed for severe pain. 02/18/23   Schuh, McKenzi P, PA-C  sevelamer carbonate (RENVELA) 800 MG tablet Take 1,600 mg by mouth 3 (three) times daily with meals. 01/06/23   [provider]  tamsulosin (FLOMAX) 0.4 MG CAPS capsule Take 0.4 mg by mouth 2 (two) times daily.    [provider]    Social History   Socioeconomic History   Marital status:  Married    Spouse name: Not on file   Number of children: Not on file   Years of education: Not on file   Highest education level: Not on file  Occupational History   Not on file  Tobacco Use   Smoking status: Every Day    Current packs/day: 0.50    Types: Cigars, Cigarettes    Last attempt to quit: 2019    Passive exposure: Past   Smokeless tobacco: Never   Tobacco comments:    1-2 per day -  black and mild cigars  Vaping Use   Vaping status: Never Used  Substance and Sexual Activity   Alcohol use: Yes    Alcohol/week: 14.0 standard drinks of alcohol    Types: 14 Cans of beer per week    Comment: Daily one-two beers per day   Drug use: Yes    Frequency: 1.0 times per week    Types: Marijuana, Cocaine    Comment: marijuana last use 02/14/23, cocaine - no longer using per pt 11/27/22 once a week (11/19/22)   Sexual activity: Yes  Other Topics Concern   Not on file  Social History Narrative   Not on file   Social Determinants of Health   Financial Resource Strain: Low Risk  (09/03/2022)   Received from [facility], [facility]    Overall Financial Resource Strain (CARDIA)    Difficulty of Paying Living Expenses: Not hard at all  Food Insecurity: No Food Insecurity (12/18/2022)   Hunger Vital Sign    Worried About Running Out of Food in the Last Year: Never true    Ran Out of Food in the Last Year: Never true  Transportation Needs: No Transportation Needs (12/18/2022)   PRAPARE - Administrator, Civil Service (Medical): No    Lack of Transportation (Non-Medical): No  Physical Activity: Not on file  Stress: No Stress Concern Present (09/03/2022)   Received from [facility], [facility] Occupational Health - Occupational Stress Questionnaire    Feeling of Stress : Not at all  Social Connections: Unknown (09/03/2022)   Received from [facility], [facility]   Social Network    Social Network: Not on file  Intimate Partner Violence: Not At Risk (12/18/2022)   Humiliation, Afraid, Rape, and Kick questionnaire    Fear of Current or Ex-Partner: No    Emotionally Abused: No    Physically Abused: No    Sexually Abused: No     Family History  Problem Relation Age of Onset   Heart disease Mother    Heart attack Sister 58    ROS: [x]  Positive   [ ]  Negative   [ ]  All sytems reviewed and are negative  Cardiovascular: []  chest pain/pressure []  palpitations []  SOB lying flat []  DOE []  pain in legs while walking []  pain in legs at rest []  pain in legs at night []  non-healing ulcers []  hx of DVT []  swelling in legs  Pulmonary: []  productive cough []  asthma/wheezing []  home O2  Neurologic: []  weakness in []  arms []  legs []  numbness in []  arms []  legs []  hx of CVA []  mini stroke [] difficulty speaking or slurred speech []  temporary loss of vision in one eye []  dizziness  Hematologic: []  hx of cancer []  bleeding problems []  problems with blood clotting easily  Endocrine:   []  diabetes []  thyroid disease  GI []  vomiting blood []  blood in stool  GU: []   CKD/renal  failure []  HD--[]  M/W/F or []  T/T/S []  burning with urination []  blood in urine  Psychiatric: []  anxiety []  depression  Musculoskeletal: []  arthritis []  joint pain  Integumentary: []  rashes []  ulcers  Constitutional: []  fever []  chills   Physical Examination  Vitals:   03/05/23 0745 03/05/23 0745  BP: 125/66 129/70  Pulse: 65 68  Resp: 12 20  Temp:  98 F (36.7 C)  SpO2: 100% 99%   Body mass index is 21.19 kg/m.  General:  NAD HENT: WNL, normocephalic Pulmonary: normal non-labored breathing Cardiac: regular, without  Murmurs, rubs or gallops Abdomen:  soft, NT/ND Vascular Exam/Pulses: Right upper arm basilic vein with great thrill. Right arm incisions healing without hematoma. Musculoskeletal: no muscle wasting or atrophy  Neurologic: A&O X 3; Appropriate Affect ; SENSATION: normal; MOTOR FUNCTION:  moving all extremities equally. Speech is fluent/normal  CBC    Component Value Date/Time   WBC 5.2 03/05/2023 0741   RBC 4.33 03/05/2023 0741   HGB 12.1 (L) 03/05/2023 0741   HCT 37.2 (L) 03/05/2023 0741   PLT 187 03/05/2023 0741   MCV 85.9 03/05/2023 0741   MCH 27.9 03/05/2023 0741   MCHC 32.5 03/05/2023 0741   RDW 15.4 03/05/2023 0741   LYMPHSABS 1.6 03/05/2023 0741   MONOABS 0.7 03/05/2023 0741   EOSABS 0.3 03/05/2023 0741   BASOSABS 0.1 03/05/2023 0741    BMET    Component Value Date/Time   NA 134 (L) 03/05/2023 0741   NA 140 09/22/2017 0856   K 4.7 03/05/2023 0741   CL 97 (L) 03/05/2023 0741   CO2 21 (L) 03/05/2023 0741   GLUCOSE 88 03/05/2023 0741   BUN 42 (H) 03/05/2023 0741   BUN 12 09/22/2017 0856   CREATININE 6.63 (H) 03/05/2023 0741   CREATININE 1.08 09/01/2013 1615   CALCIUM 9.7 03/05/2023 0741   GFRNONAA 9 (L) 03/05/2023 0741   GFRNONAA 77 09/01/2013 1615   GFRAA 50 (L) 09/22/2017 0856   GFRAA 89 09/01/2013 1615    COAGS: Lab Results  Component Value Date   INR 1.1 12/16/2022   INR 1.1 01/28/2022      Non-Invasive Vascular Imaging:    N/A  ASSESSMENT/PLAN: This is a 67 y.o. male with history of end-stage renal disease that presented to the ED after he pulled out his right femoral tunneled dialysis catheter.  Patient apparently woke up in a daze and does not remember pulling out his catheter.  He has a right arm basilic vein fistula this just underwent transposition 2 weeks ago.  This is healing without issue.  Vascular surgery was consulted to replace his TDC.  Too early to use his right arm AVF since he is only 2 weeks post-op.  I will add on the patient to the Cath Lab today for [facility] replacement.  The OR schedule is full.  Please keep NPO.  Please hold anticoagulation.  Risk benefits discussed.  Cephus Shelling, MD [facility] Office: [phone]  Cephus Shelling

## 2023-03-05 NOTE — ED Notes (Signed)
ED TO INPATIENT HANDOFF REPORT  ED Nurse Name and Phone #:   Brett Albino 1610 R Name/Age/Gender Dickey Gave 67 y.o. male Room/Bed: 036C/036C  Code Status   Code Status: Full Code  Home/SNF/Other Home Patient oriented to: self Is this baseline? Yes   Triage Complete: Triage complete  Chief Complaint Displacement of vascular dialysis catheter [facility]) [T82.42XA]  Triage Note Pt BIB [facility] from home due to catheter coming out.  Pt has port on right upper thigh and was going to the bathroom this AM when he noticed that catheter was on the floor and blood was dripping down leg.  Fire put pressure dressing on area.  Pt has been getting dialyzed for the past 2 years; Tuesday, Thursday and Saturday.  Pt does take eliquis.  VS BP 140/80   Allergies No Known Allergies  Level of Care/Admitting Diagnosis ED Disposition     ED Disposition  Admit   Condition  --   Comment  Hospital Area: [facility] [[id number]]  Level of Care: Telemetry Medical [104]  May place patient in observation at [facility] or [facility] if equivalent level of care is available:: No  Covid Evaluation: Asymptomatic - no recent exposure (last 10 days) testing not required  Diagnosis: Displacement of vascular dialysis catheter [facility]) [[id number]]  Admitting Physician: Reymundo Poll [[id number]]  Attending Physician: Reymundo Poll [[id number]]          B Medical/Surgery History Past Medical History:  Diagnosis Date   Anemia    Arthritis    Back pain    Bronchitis    Chronic kidney disease    dialysis on tues, thurs, sat   COPD (chronic obstructive pulmonary disease) (HCC)    Coronary artery disease    COVID    mild - flu like symptoms   Dyspnea    w/ exertion, uses inhaler   GERD (gastroesophageal reflux disease)    not a current problem   Heart murmur    never has caused any problems   Hypertension    Myocardial infarction (HCC)    Pneumonia    x 1   Pre-diabetes    diet controlled, no  meds, does not check blood sugar   Substance abuse (HCC)    Past Surgical History:  Procedure Laterality Date   AV FISTULA PLACEMENT Left 04/12/2021   Procedure: LEFT ARM ARTERIOVENOUS (AV) FISTULA CREATION;  Surgeon: Cephus Shelling, MD;  [facility];  Service: Vascular;  Laterality: Left;   AV FISTULA PLACEMENT Left 10/18/2021   Procedure: LEFT ARM BRACHIOBASILIC FISTULA CREATION FIRST STAGE;  Surgeon: Cephus Shelling, MD;  [facility];  Service: Vascular;  Laterality: Left;   AV FISTULA PLACEMENT Right 11/28/2022   Procedure: RIGHT ARM FIRST STAGE BRACHIOBASILIC FISTULA CREATION;  Surgeon: Cephus Shelling, MD;  [facility];  Service: Vascular;  Laterality: Right;   BACK SURGERY     BASCILIC VEIN TRANSPOSITION Left 01/27/2022   Procedure: LEFT SECOND STAGE BASILIC VEIN TRANSPOSITION;  Surgeon: Cephus Shelling, MD;  [facility];  Service: Vascular;  Laterality: Left;   BASCILIC VEIN TRANSPOSITION Right 02/18/2023   Procedure: SECOND STAGE RIGHT ARM BASILIC VEIN TRANSPOSITION;  Surgeon: Cephus Shelling, MD;  Location: [facility] OR;  Service: Vascular;  Laterality: Right;   BIOPSY  08/20/2022   Procedure: BIOPSY;  Surgeon: Shellia Cleverly, DO;  [facility];  Service: Gastroenterology;;   COLONOSCOPY WITH PROPOFOL N/A 08/20/2022   Procedure: COLONOSCOPY WITH PROPOFOL;  Surgeon: Barron Alvine,  Verlin Dike, DO;  [facility];  Service: Gastroenterology;  Laterality: N/A;   ESOPHAGOGASTRODUODENOSCOPY (EGD) WITH PROPOFOL N/A 08/20/2022   Procedure: ESOPHAGOGASTRODUODENOSCOPY (EGD) WITH PROPOFOL;  Surgeon: Shellia Cleverly, DO;  [facility];  Service: Gastroenterology;  Laterality: N/A;   INSERTION OF DIALYSIS CATHETER Right 05/01/2022   Procedure: INSERTION OF DIALYSIS CATHETER;  Surgeon: [name], MD;  [facility];  Service: Vascular;  Laterality: Right;   IR REMOVAL TUN CV CATH W/O FL  12/19/2022   LIGATION OF COMPETING BRANCHES OF  ARTERIOVENOUS FISTULA Left 07/15/2021   Procedure: LIGATION OF COMPETING BRANCHES OF LEFT RADIOCEPHALIC ARTERIOVENOUS FISTULA TIMES TWO;  Surgeon: Cephus Shelling, MD;  Location: [facility] OR;  Service: Vascular;  Laterality: Left;   POLYPECTOMY  08/20/2022   Procedure: POLYPECTOMY;  Surgeon: Shellia Cleverly, DO;  [facility];  Service: Gastroenterology;;   REVISON OF ARTERIOVENOUS FISTULA Left 07/15/2021   Procedure: REVISON OF LEFT ARTERIOVENOUS FISTULA;  Surgeon: Cephus Shelling, MD;  [facility];  Service: Vascular;  Laterality: Left;   REVISON OF ARTERIOVENOUS FISTULA Left 05/01/2022   Procedure: ARTERIOVENOUS FISTULA WASHOUT OF ARM HEMATOMA;  Surgeon: [name], MD;  [facility];  Service: Vascular;  Laterality: Left;     A IV Location/Drains/Wounds Patient Lines/Drains/Airways Status     Active Line/Drains/Airways     Name Placement date Placement time Site Days   Peripheral IV 03/05/23 20 G Anterior;Left;Proximal Forearm 03/05/23  0745  Forearm  less than 1   Fistula / Graft Right Upper arm Arteriovenous fistula 11/28/22  0923  Upper arm  97   Fistula / Graft Right Upper arm Arteriovenous fistula 02/18/23  1217  Upper arm  15            Intake/Output Last 24 hours No intake or output data in the 24 hours ending 03/05/23 1416  Labs/Imaging Results for orders placed or performed during the hospital encounter of 03/05/23 (from the past 48 hour(s))  Type and screen [facility]     Status: None   Collection Time: 03/05/23  7:40 AM  Result Value Ref Range   ABO/RH(D) B POS    Antibody Screen NEG    Sample Expiration      03/08/2023,2359 Performed at [facility], [address] [place], [place] [address]   CBC with Differential     Status: Abnormal   Collection Time: 03/05/23  7:41 AM  Result Value Ref Range   WBC 5.2 4.0 - 10.5 K/uL   RBC 4.33 4.22 - 5.81 MIL/uL   Hemoglobin 12.1 (L) 13.0 - 17.0 g/dL   HCT 19.1 (L)  47.8 - 52.0 %   MCV 85.9 80.0 - 100.0 fL   MCH 27.9 26.0 - 34.0 pg   MCHC 32.5 30.0 - 36.0 g/dL   RDW 29.5 62.1 - 30.8 %   Platelets 187 150 - 400 K/uL   nRBC 0.0 0.0 - 0.2 %   Neutrophils Relative % 47 %   Neutro Abs 2.4 1.7 - 7.7 K/uL   Lymphocytes Relative 30 %   Lymphs Abs 1.6 0.7 - 4.0 K/uL   Monocytes Relative 14 %   Monocytes Absolute 0.7 0.1 - 1.0 K/uL   Eosinophils Relative 7 %   Eosinophils Absolute 0.3 0.0 - 0.5 K/uL   Basophils Relative 2 %   Basophils Absolute 0.1 0.0 - 0.1 K/uL   Immature Granulocytes 0 %   Abs Immature Granulocytes 0.01 0.00 -  0.07 K/uL    Comment: Performed at [facility], [address] [place], [place] [address]  Comprehensive metabolic panel     Status: Abnormal   Collection Time: 03/05/23  7:41 AM  Result Value Ref Range   Sodium 134 (L) 135 - 145 mmol/L   Potassium 4.7 3.5 - 5.1 mmol/L   Chloride 97 (L) 98 - 111 mmol/L   CO2 21 (L) 22 - 32 mmol/L   Glucose, Bld 88 70 - 99 mg/dL    Comment: Glucose reference range applies only to samples taken after fasting for at least 8 hours.   BUN 42 (H) 8 - 23 mg/dL   Creatinine, Ser 7.42 (H) 0.61 - 1.24 mg/dL   Calcium 9.7 8.9 - 59.5 mg/dL   Total Protein 7.5 6.5 - 8.1 g/dL   Albumin 3.5 3.5 - 5.0 g/dL   AST 28 15 - 41 U/L   ALT 11 0 - 44 U/L   Alkaline Phosphatase 63 38 - 126 U/L   Total Bilirubin 0.8 0.3 - 1.2 mg/dL   GFR, Estimated 9 (L) >60 mL/min    Comment: (NOTE) Calculated using the CKD-EPI Creatinine Equation (2021)    Anion gap 16 (H) 5 - 15    Comment: Performed at [facility], [address] [place], [place] [address]  CBC     Status: Abnormal   Collection Time: 03/05/23 10:07 AM  Result Value Ref Range   WBC 6.4 4.0 - 10.5 K/uL   RBC 4.10 (L) 4.22 - 5.81 MIL/uL   Hemoglobin 11.5 (L) 13.0 - 17.0 g/dL   HCT 64.3 (L) 32.9 - 51.8 %   MCV 85.4 80.0 - 100.0 fL   MCH 28.0 26.0 - 34.0 pg   MCHC 32.9 30.0 - 36.0 g/dL   RDW 84.1 66.0 - 63.0 %   Platelets 200 150 - 400  K/uL   nRBC 0.0 0.0 - 0.2 %    Comment: Performed at [facility], [address] [place], [place] [address]   No results found.  Pending Labs Unresulted Labs (From admission, onward)     Start     Ordered   03/06/23 0500  CBC  Tomorrow morning,   R        03/05/23 1139   03/06/23 0500  Renal function panel  Tomorrow morning,   R        03/05/23 1139            Vitals/Pain Today's Vitals   03/05/23 0745 03/05/23 0745 03/05/23 0945 03/05/23 1100  BP: 125/66 129/70 (!) 140/75 (!) 112/97  Pulse: 65 68 67 65  Resp: 12 20 20 20   Temp:  98 F (36.7 C)  98.1 F (36.7 C)  TempSrc:  Oral  Oral  SpO2: 100% 99% 100% 100%  Weight:      Height:      PainSc:        Isolation Precautions No active isolations  Medications Medications  acetaminophen (TYLENOL) tablet 650 mg (has no administration in time range)    Or  acetaminophen (TYLENOL) suppository 650 mg (has no administration in time range)  senna-docusate (Senokot-S) tablet 1 tablet (has no administration in time range)    Mobility walks     Focused Assessments Cardiac Assessment Handoff:    Lab Results  Component Value Date   TROPONINI 0.05 (HH) 08/20/2017   No results found for: "DDIMER" Does the Patient currently have chest pain? No  R Recommendations: See Admitting Provider Note  Report given to:   Additional Notes: none

## 2023-03-06 ENCOUNTER — Encounter (HOSPITAL_COMMUNITY): Payer: Self-pay | Admitting: Vascular Surgery

## 2023-03-06 NOTE — Progress Notes (Signed)
Late Note Entry- March 06, 2023  Pt was d/c to home last evening. Contacted [facility] this morning to advise clinic of pt's d/c date. Navigator advised that pt came to clinic this morning and declined to wait for machine to be set-up for pt and pt left without treatment.   Olivia Canter Renal Navigator [phone]

## 2023-03-13 ENCOUNTER — Emergency Department (HOSPITAL_COMMUNITY): Payer: Non-veteran care

## 2023-03-13 ENCOUNTER — Other Ambulatory Visit: Payer: Self-pay

## 2023-03-13 ENCOUNTER — Encounter (HOSPITAL_COMMUNITY): Payer: Self-pay

## 2023-03-13 ENCOUNTER — Inpatient Hospital Stay (HOSPITAL_COMMUNITY)
Admission: EM | Admit: 2023-03-13 | Discharge: 2023-03-14 | DRG: 917 | Discharge status: Against Medical Advice | Payer: Non-veteran care | Attending: Internal Medicine | Admitting: Internal Medicine

## 2023-03-13 DIAGNOSIS — R400 Somnolence: Principal | ICD-10-CM

## 2023-03-13 DIAGNOSIS — Z86718 Personal history of other venous thrombosis and embolism: Secondary | ICD-10-CM

## 2023-03-13 DIAGNOSIS — I252 Old myocardial infarction: Secondary | ICD-10-CM | POA: Diagnosis not present

## 2023-03-13 DIAGNOSIS — Z7982 Long term (current) use of aspirin: Secondary | ICD-10-CM

## 2023-03-13 DIAGNOSIS — N400 Enlarged prostate without lower urinary tract symptoms: Secondary | ICD-10-CM | POA: Diagnosis not present

## 2023-03-13 DIAGNOSIS — E785 Hyperlipidemia, unspecified: Secondary | ICD-10-CM | POA: Diagnosis present

## 2023-03-13 DIAGNOSIS — I5042 Chronic combined systolic (congestive) and diastolic (congestive) heart failure: Secondary | ICD-10-CM | POA: Diagnosis not present

## 2023-03-13 DIAGNOSIS — N186 End stage renal disease: Secondary | ICD-10-CM | POA: Diagnosis present

## 2023-03-13 DIAGNOSIS — Z87891 Personal history of nicotine dependence: Secondary | ICD-10-CM

## 2023-03-13 DIAGNOSIS — Z8673 Personal history of transient ischemic attack (TIA), and cerebral infarction without residual deficits: Secondary | ICD-10-CM | POA: Diagnosis not present

## 2023-03-13 DIAGNOSIS — T405X1A Poisoning by cocaine, accidental (unintentional), initial encounter: Secondary | ICD-10-CM | POA: Diagnosis not present

## 2023-03-13 DIAGNOSIS — Z79899 Other long term (current) drug therapy: Secondary | ICD-10-CM

## 2023-03-13 DIAGNOSIS — J449 Chronic obstructive pulmonary disease, unspecified: Secondary | ICD-10-CM | POA: Diagnosis not present

## 2023-03-13 DIAGNOSIS — I2489 Other forms of acute ischemic heart disease: Secondary | ICD-10-CM | POA: Diagnosis not present

## 2023-03-13 DIAGNOSIS — G629 Polyneuropathy, unspecified: Secondary | ICD-10-CM

## 2023-03-13 DIAGNOSIS — D631 Anemia in chronic kidney disease: Secondary | ICD-10-CM | POA: Diagnosis present

## 2023-03-13 DIAGNOSIS — I132 Hypertensive heart and chronic kidney disease with heart failure and with stage 5 chronic kidney disease, or end stage renal disease: Secondary | ICD-10-CM | POA: Diagnosis present

## 2023-03-13 DIAGNOSIS — K219 Gastro-esophageal reflux disease without esophagitis: Secondary | ICD-10-CM | POA: Diagnosis not present

## 2023-03-13 DIAGNOSIS — F1490 Cocaine use, unspecified, uncomplicated: Secondary | ICD-10-CM | POA: Insufficient documentation

## 2023-03-13 DIAGNOSIS — Z8249 Family history of ischemic heart disease and other diseases of the circulatory system: Secondary | ICD-10-CM | POA: Diagnosis not present

## 2023-03-13 DIAGNOSIS — Z5329 Procedure and treatment not carried out because of patient's decision for other reasons: Secondary | ICD-10-CM | POA: Diagnosis not present

## 2023-03-13 DIAGNOSIS — I5020 Unspecified systolic (congestive) heart failure: Secondary | ICD-10-CM

## 2023-03-13 DIAGNOSIS — I2510 Atherosclerotic heart disease of native coronary artery without angina pectoris: Secondary | ICD-10-CM | POA: Diagnosis not present

## 2023-03-13 DIAGNOSIS — G928 Other toxic encephalopathy: Secondary | ICD-10-CM | POA: Diagnosis present

## 2023-03-13 DIAGNOSIS — N2581 Secondary hyperparathyroidism of renal origin: Secondary | ICD-10-CM | POA: Diagnosis not present

## 2023-03-13 DIAGNOSIS — R7989 Other specified abnormal findings of blood chemistry: Secondary | ICD-10-CM

## 2023-03-13 DIAGNOSIS — G9341 Metabolic encephalopathy: Secondary | ICD-10-CM

## 2023-03-13 DIAGNOSIS — Z992 Dependence on renal dialysis: Secondary | ICD-10-CM | POA: Diagnosis not present

## 2023-03-13 DIAGNOSIS — I5040 Unspecified combined systolic (congestive) and diastolic (congestive) heart failure: Secondary | ICD-10-CM

## 2023-03-13 DIAGNOSIS — I9589 Other hypotension: Secondary | ICD-10-CM | POA: Diagnosis not present

## 2023-03-13 DIAGNOSIS — I10 Essential (primary) hypertension: Secondary | ICD-10-CM | POA: Diagnosis present

## 2023-03-13 DIAGNOSIS — Z7901 Long term (current) use of anticoagulants: Secondary | ICD-10-CM

## 2023-03-13 DIAGNOSIS — D638 Anemia in other chronic diseases classified elsewhere: Secondary | ICD-10-CM | POA: Diagnosis present

## 2023-03-13 LAB — COMPREHENSIVE METABOLIC PANEL
ALT: 14 U/L (ref 0–44)
AST: 22 U/L (ref 15–41)
Albumin: 3.4 g/dL — ABNORMAL LOW (ref 3.5–5.0)
Alkaline Phosphatase: 70 U/L (ref 38–126)
Anion gap: 13 (ref 5–15)
BUN: 36 mg/dL — ABNORMAL HIGH (ref 8–23)
CO2: 24 mmol/L (ref 22–32)
Calcium: 9.9 mg/dL (ref 8.9–10.3)
Chloride: 100 mmol/L (ref 98–111)
Creatinine, Ser: 7.83 mg/dL — ABNORMAL HIGH (ref 0.61–1.24)
GFR, Estimated: 7 mL/min — ABNORMAL LOW (ref >60)
Glucose, Bld: 147 mg/dL — ABNORMAL HIGH (ref 70–99)
Potassium: 4.7 mmol/L (ref 3.5–5.1)
Sodium: 137 mmol/L (ref 135–145)
Total Bilirubin: 0.6 mg/dL (ref 0.3–1.2)
Total Protein: 7.9 g/dL (ref 6.5–8.1)

## 2023-03-13 LAB — CBG MONITORING, ED: Glucose-Capillary: 136 mg/dL — ABNORMAL HIGH (ref 70–99)

## 2023-03-13 LAB — DIFFERENTIAL
Abs Immature Granulocytes: 0.02 10*3/uL (ref 0.00–0.07)
Basophils Absolute: 0.1 10*3/uL (ref 0.0–0.1)
Basophils Relative: 1 %
Eosinophils Absolute: 0.5 10*3/uL (ref 0.0–0.5)
Eosinophils Relative: 9 %
Immature Granulocytes: 0 %
Lymphocytes Relative: 29 %
Lymphs Abs: 1.6 10*3/uL (ref 0.7–4.0)
Monocytes Absolute: 0.9 10*3/uL (ref 0.1–1.0)
Monocytes Relative: 16 %
Neutro Abs: 2.4 10*3/uL (ref 1.7–7.7)
Neutrophils Relative %: 45 %

## 2023-03-13 LAB — I-STAT CHEM 8, ED
BUN: 34 mg/dL — ABNORMAL HIGH (ref 8–23)
Calcium, Ion: 1.18 mmol/L (ref 1.15–1.40)
Chloride: 103 mmol/L (ref 98–111)
Creatinine, Ser: 8.8 mg/dL — ABNORMAL HIGH (ref 0.61–1.24)
Glucose, Bld: 112 mg/dL — ABNORMAL HIGH (ref 70–99)
HCT: 32 % — ABNORMAL LOW (ref 39.0–52.0)
Hemoglobin: 10.9 g/dL — ABNORMAL LOW (ref 13.0–17.0)
Potassium: 4.8 mmol/L (ref 3.5–5.1)
Sodium: 138 mmol/L (ref 135–145)
TCO2: 25 mmol/L (ref 22–32)

## 2023-03-13 LAB — CBC
HCT: 32.7 % — ABNORMAL LOW (ref 39.0–52.0)
Hemoglobin: 10.3 g/dL — ABNORMAL LOW (ref 13.0–17.0)
MCH: 27.6 pg (ref 26.0–34.0)
MCHC: 31.5 g/dL (ref 30.0–36.0)
MCV: 87.7 fL (ref 80.0–100.0)
Platelets: 169 10*3/uL (ref 150–400)
RBC: 3.73 MIL/uL — ABNORMAL LOW (ref 4.22–5.81)
RDW: 15.4 % (ref 11.5–15.5)
WBC: 5.4 10*3/uL (ref 4.0–10.5)
nRBC: 0 % (ref 0.0–0.2)

## 2023-03-13 LAB — ETHANOL: Alcohol, Ethyl (B): <10 mg/dL (ref <10)

## 2023-03-13 LAB — TROPONIN I (HIGH SENSITIVITY): Troponin I (High Sensitivity): 40 ng/L — ABNORMAL HIGH (ref <18)

## 2023-03-13 LAB — APTT: aPTT: 32 seconds (ref 24–36)

## 2023-03-13 LAB — PROTIME-INR
INR: 1.2 (ref 0.8–1.2)
Prothrombin Time: 15.8 seconds — ABNORMAL HIGH (ref 11.4–15.2)

## 2023-03-13 MED ORDER — IOHEXOL 350 MG/ML SOLN
100.0000 mL | Freq: Once | INTRAVENOUS | Status: AC | PRN
  Administered 2023-03-13: 100 mL via INTRAVENOUS

## 2023-03-13 NOTE — ED Provider Notes (Signed)
[place] EMERGENCY DEPARTMENT AT [facility] Provider Note   CSN: [id number] Arrival date & time: 03/13/23  1931     History {Add pertinent medical, surgical, social history, OB history to HPI:1} Chief Complaint  Patient presents with   Hypotension    Dana Dorner is a 67 y.o. male.  Patient has a history of hypertension, CAD, end-stage renal disease, polysubstance abuse.  Patient gets dialysis Tuesday Thursday Saturday.  Patient is on Eliquis due to a clot in tunneled dialysis catheter.  Wife states that around 5:00 her husband became more confused and sleepy.  Wife also states patient has been using drugs today  The history is provided by the patient and a relative. No language interpreter was used.  Weakness Severity:  Moderate Onset quality:  Sudden Timing:  Constant Progression:  Worsening Chronicity:  New Context: not alcohol use   Relieved by:  Nothing Worsened by:  Nothing Ineffective treatments:  None tried Associated symptoms: no abdominal pain, no chest pain, no cough, no diarrhea, no frequency, no headaches and no seizures        Home Medications Prior to Admission medications   Medication Sig Start Date End Date Taking? Authorizing Provider  albuterol (VENTOLIN HFA) 108 (90 Base) MCG/ACT inhaler Inhale 2 puffs into the lungs every 6 (six) hours as needed for wheezing or shortness of breath.    [provider]  amLODipine (NORVASC) 10 MG tablet Take 1 tablet (10 mg total) by mouth daily. 01/01/18   Lewayne Bunting, MD  apixaban (ELIQUIS) 5 MG TABS tablet Take 1 tablet (5 mg total) by mouth 2 (two) times daily. 03/06/23   Atway, Derwood Kaplan, DO  aspirin EC 81 MG tablet Take 81 mg by mouth daily.    [provider]  Blood Pressure KIT Please check blood pressure every day, and keep a log. 12/07/22   Arrien, York Ram, MD  diclofenac Sodium (VOLTAREN) 1 % GEL Apply 1 Application topically 2 (two) times daily as needed (pain).     [provider]  furosemide (LASIX) 40 MG tablet Take 40 mg by mouth 2 (two) times daily.    [provider]  gabapentin (NEURONTIN) 300 MG capsule Take 300 mg by mouth 2 (two) times daily. 12/31/22   [provider]  hydrALAZINE (APRESOLINE) 25 MG tablet Take 1 tablet (25 mg total) by mouth daily. Patient taking differently: Take 25 mg by mouth See admin instructions. 25 mg twice daily, except 1 tablet daily on dialysis days (taken after dialysis). 12/07/22   Arrien, York Ram, MD  isosorbide mononitrate (IMDUR) 30 MG 24 hr tablet Take 1 tablet (30 mg total) by mouth daily. 01/01/18   Lewayne Bunting, MD  labetalol (NORMODYNE) 100 MG tablet Take 2 tablets (200 mg total) by mouth every morning. Patient taking differently: Take 100 mg by mouth See admin instructions. 100 mg twice daily, except 1 tablet daily on dialysis days (taken after dialysis). 01/01/18   Lewayne Bunting, MD  lidocaine (LIDODERM) 5 % Place 1 patch onto the skin daily as needed (pain). Remove & Discard patch within 12 hours or as directed by MD    [provider]  multivitamin (RENA-VIT) TABS tablet Take 1 tablet by mouth daily.    [provider]  omeprazole (PRILOSEC) 40 MG capsule Take 1 capsule (40 mg total) by mouth daily. Patient taking differently: Take 40 mg by mouth 2 (two) times daily as needed. 06/06/22   Arnaldo Natal, NP  sevelamer carbonate (RENVELA) 800 MG tablet Take 1,600 mg by mouth 3 (three) times daily with meals. 01/06/23   [provider]  tamsulosin (FLOMAX) 0.4 MG CAPS capsule Take 0.4 mg by mouth 2 (two) times daily.    [provider]      Allergies    Patient has no known allergies.    Review of Systems   Review of Systems  Constitutional:  Negative for appetite change and fatigue.  HENT:  Negative for congestion, ear discharge and sinus pressure.   Eyes:  Negative for discharge.  Respiratory:  Negative for cough.    Cardiovascular:  Negative for chest pain.  Gastrointestinal:  Negative for abdominal pain and diarrhea.  Genitourinary:  Negative for frequency and hematuria.  Musculoskeletal:  Negative for back pain.  Skin:  Negative for rash.  Neurological:  Positive for weakness. Negative for seizures and headaches.  Psychiatric/Behavioral:  Negative for hallucinations.     Physical Exam Updated Vital Signs BP (!) 134/59   Pulse 73   Temp 97.9 F (36.6 C) (Oral)   Resp 11   Ht 5\' 10"  (1.778 m)   Wt 65.8 kg   SpO2 99%   BMI 20.81 kg/m  Physical Exam Vitals and nursing note reviewed.  Constitutional:      Appearance: He is well-developed.     Comments: lethargic  HENT:     Head: Normocephalic.  Eyes:     General: No scleral icterus.    Conjunctiva/sclera: Conjunctivae normal.  Neck:     Thyroid: No thyromegaly.  Cardiovascular:     Rate and Rhythm: Normal rate and regular rhythm.     Heart sounds: No murmur heard.    No friction rub. No gallop.  Pulmonary:     Breath sounds: No stridor. No wheezing or rales.  Chest:     Chest wall: No tenderness.  Abdominal:     General: There is no distension.     Tenderness: There is no abdominal tenderness. There is no rebound.  Musculoskeletal:        General: Normal range of motion.     Cervical back: Neck supple.  Lymphadenopathy:     Cervical: No cervical adenopathy.  Skin:    Findings: No erythema or rash.  Neurological:     Motor: No abnormal muscle tone.     Coordination: Coordination normal.     Comments: Patient is oriented to person and place but he is lethargic patient seems to have weakness of all extremities but worse on the right side  Psychiatric:        Behavior: Behavior normal.     ED Results / Procedures / Treatments   Labs (all labs ordered are listed, but only abnormal results are displayed) Labs Reviewed  PROTIME-INR - Abnormal; Notable for the following components:      Result Value   Prothrombin Time  15.8 (*)    All other components within normal limits  COMPREHENSIVE METABOLIC PANEL - Abnormal; Notable for the following components:   Glucose, Bld 147 (*)    BUN 36 (*)    Creatinine, Ser 7.83 (*)    Albumin 3.4 (*)    GFR, Estimated 7 (*)    All other components within normal limits  CBC - Abnormal; Notable for the following components:   RBC 3.73 (*)    Hemoglobin 10.3 (*)    HCT 32.7 (*)    All other components within normal limits  I-STAT CHEM 8, ED - Abnormal;  Notable for the following components:   BUN 34 (*)    Creatinine, Ser 8.80 (*)    Glucose, Bld 112 (*)    Hemoglobin 10.9 (*)    HCT 32.0 (*)    All other components within normal limits  CBG MONITORING, ED - Abnormal; Notable for the following components:   Glucose-Capillary 136 (*)    All other components within normal limits  TROPONIN I (HIGH SENSITIVITY) - Abnormal; Notable for the following components:   Troponin I (High Sensitivity) 40 (*)    All other components within normal limits  ETHANOL  APTT  DIFFERENTIAL  RAPID URINE DRUG SCREEN, HOSP PERFORMED  URINALYSIS, ROUTINE W REFLEX MICROSCOPIC  TROPONIN I (HIGH SENSITIVITY)    EKG None  Radiology CT CEREBRAL PERFUSION W CONTRAST  Result Date: 03/13/2023 CLINICAL DATA:  Initial evaluation for stroke. EXAM: CT ANGIOGRAPHY HEAD AND NECK CT PERFUSION BRAIN TECHNIQUE: Multidetector CT imaging of the head and neck was performed using the standard protocol during bolus administration of intravenous contrast. Multiplanar CT image reconstructions and MIPs were obtained to evaluate the vascular anatomy. Carotid stenosis measurements (when applicable) are obtained utilizing NASCET criteria, using the distal internal carotid diameter as the denominator. Multiphase CT imaging of the brain was performed following IV bolus contrast injection. Subsequent parametric perfusion maps were calculated using RAPID software. RADIATION DOSE REDUCTION: This exam was performed  according to the departmental dose-optimization program which includes automated exposure control, adjustment of the mA and/or kV according to patient size and/or use of iterative reconstruction technique. CONTRAST:  OMNIPAQUE IOHEXOL 350 MG/ML SOLN COMPARISON:  CT from earlier the same day.  The FINDINGS: CTA NECK FINDINGS Aortic arch: Standard branching. Imaged portion shows no evidence of aneurysm or dissection. No significant stenosis of the major arch vessel origins. Mild aortic atherosclerosis. Right carotid system: No evidence of dissection, stenosis (50% or greater) or occlusion. Left carotid system: No evidence of dissection, stenosis (50% or greater) or occlusion. Vertebral arteries: Both vertebral arteries arise from the subclavian arteries. No proximal subclavian artery stenosis. Atheromatous plaque at the origin of the right vertebral artery with no more than mild stenosis. Cerebral arteries patent distally without stenosis or dissection. Skeleton: No discrete or worrisome osseous lesions. Findings compatible with dish. Other neck: No other acute finding. Upper chest: No other acute finding. Review of the MIP images confirms the above findings CTA HEAD FINDINGS Anterior circulation: Atheromatous change within the carotid siphons with associated mild to moderate narrowing bilaterally. A1 segments, anterior communicating complex common anterior cerebral arteries patent without stenosis. No M1 stenosis or occlusion. No proximal MCA branch occlusion or high-grade stenosis. Visualized distal MCA branches perfused and symmetric. Posterior circulation: Both V4 segments widely patent. Right PICA patent at its origin. Left PICA not well seen. Basilar patent without stenosis. Superior cerebellar and posterior cerebral arteries patent bilaterally. Venous sinuses: Visualized dural sinuses patent allowing for timing the contrast bolus. Anatomic variants: None significant.  No aneurysm. Review of the MIP images  confirms the above findings CT Brain Perfusion Findings: ASPECTS: 10 CBF (<30%) Volume: 0mL Perfusion (Tmax>6.0s) volume: 0mL Mismatch Volume: 0mL Infarction Location:Negative CT perfusion with no evidence for acute ischemia or other perfusion abnormality. IMPRESSION: 1. Negative CTA for large vessel occlusion or other emergent finding. 2. Negative CT perfusion with no evidence for acute ischemia or other perfusion abnormality. 3. Atheromatous change within the carotid siphons with associated mild to moderate narrowing bilaterally. Aortic Atherosclerosis (ICD10-I70.0). Electronically Signed   By: Rise Mu  M.D.   On: 03/13/2023 22:26   CT ANGIO HEAD NECK W WO CM  Result Date: 03/13/2023 CLINICAL DATA:  Initial evaluation for stroke. EXAM: CT ANGIOGRAPHY HEAD AND NECK CT PERFUSION BRAIN TECHNIQUE: Multidetector CT imaging of the head and neck was performed using the standard protocol during bolus administration of intravenous contrast. Multiplanar CT image reconstructions and MIPs were obtained to evaluate the vascular anatomy. Carotid stenosis measurements (when applicable) are obtained utilizing NASCET criteria, using the distal internal carotid diameter as the denominator. Multiphase CT imaging of the brain was performed following IV bolus contrast injection. Subsequent parametric perfusion maps were calculated using RAPID software. RADIATION DOSE REDUCTION: This exam was performed according to the departmental dose-optimization program which includes automated exposure control, adjustment of the mA and/or kV according to patient size and/or use of iterative reconstruction technique. CONTRAST:  OMNIPAQUE IOHEXOL 350 MG/ML SOLN COMPARISON:  CT from earlier the same day.  The FINDINGS: CTA NECK FINDINGS Aortic arch: Standard branching. Imaged portion shows no evidence of aneurysm or dissection. No significant stenosis of the major arch vessel origins. Mild aortic atherosclerosis. Right carotid  system: No evidence of dissection, stenosis (50% or greater) or occlusion. Left carotid system: No evidence of dissection, stenosis (50% or greater) or occlusion. Vertebral arteries: Both vertebral arteries arise from the subclavian arteries. No proximal subclavian artery stenosis. Atheromatous plaque at the origin of the right vertebral artery with no more than mild stenosis. Cerebral arteries patent distally without stenosis or dissection. Skeleton: No discrete or worrisome osseous lesions. Findings compatible with dish. Other neck: No other acute finding. Upper chest: No other acute finding. Review of the MIP images confirms the above findings CTA HEAD FINDINGS Anterior circulation: Atheromatous change within the carotid siphons with associated mild to moderate narrowing bilaterally. A1 segments, anterior communicating complex common anterior cerebral arteries patent without stenosis. No M1 stenosis or occlusion. No proximal MCA branch occlusion or high-grade stenosis. Visualized distal MCA branches perfused and symmetric. Posterior circulation: Both V4 segments widely patent. Right PICA patent at its origin. Left PICA not well seen. Basilar patent without stenosis. Superior cerebellar and posterior cerebral arteries patent bilaterally. Venous sinuses: Visualized dural sinuses patent allowing for timing the contrast bolus. Anatomic variants: None significant.  No aneurysm. Review of the MIP images confirms the above findings CT Brain Perfusion Findings: ASPECTS: 10 CBF (<30%) Volume: 0mL Perfusion (Tmax>6.0s) volume: 0mL Mismatch Volume: 0mL Infarction Location:Negative CT perfusion with no evidence for acute ischemia or other perfusion abnormality. IMPRESSION: 1. Negative CTA for large vessel occlusion or other emergent finding. 2. Negative CT perfusion with no evidence for acute ischemia or other perfusion abnormality. 3. Atheromatous change within the carotid siphons with associated mild to moderate narrowing  bilaterally. Aortic Atherosclerosis (ICD10-I70.0). Electronically Signed   By: Rise Mu M.D.   On: 03/13/2023 22:26   DG Chest Port 1 View  Result Date: 03/13/2023 CLINICAL DATA:  Weakness. EXAM: PORTABLE CHEST 1 VIEW COMPARISON:  Radiograph 12/16/2022, CT 12/17/2022 FINDINGS: Previous left-sided dialysis catheter is been removed. Cardiomegaly stable. Unchanged mediastinal contours. Streaky retrocardiac opacity likely represents scarring. There may be small pleural effusions. No pulmonary edema or confluent consolidation. No pneumothorax. On limited assessment, no acute osseous findings. IMPRESSION: 1. Stable cardiomegaly. Possible small pleural effusions. 2. Streaky retrocardiac opacity likely represents scarring. Electronically Signed   By: Narda Rutherford M.D.   On: 03/13/2023 21:49   CT HEAD CODE STROKE WO CONTRAST  Result Date: 03/13/2023 CLINICAL DATA:  Code stroke. Initial evaluation  for neuro deficit, stroke. Right-sided weakness. EXAM: CT HEAD WITHOUT CONTRAST TECHNIQUE: Contiguous axial images were obtained from the base of the skull through the vertex without intravenous contrast. RADIATION DOSE REDUCTION: This exam was performed according to the departmental dose-optimization program which includes automated exposure control, adjustment of the mA and/or kV according to patient size and/or use of iterative reconstruction technique. COMPARISON:  Prior CT from 12/31/2017. FINDINGS: Brain: Age-related cerebral atrophy with moderate chronic microvascular ischemic disease. No acute intracranial hemorrhage. No acute large vessel territory infarct. No mass lesion, midline shift or mass effect. No hydrocephalus or extra-axial fluid collection. Vascular: No abnormal hyperdense vessel. Scattered calcified atherosclerosis about the skull base. Skull: Small lipoma at the right frontal scalp. No acute finding. Calvarium intact. Sinuses/Orbits: Globes and orbital soft tissues within normal limits.  Small retention cysts within the right sphenoid sinus. Paranasal sinuses are otherwise clear. No mastoid effusion. Other: None. ASPECTS [facility] Stroke Program Early CT Score) - Ganglionic level infarction (caudate, lentiform nuclei, internal capsule, insula, M1-M3 cortex): 7 - Supraganglionic infarction (M4-M6 cortex): 3 Total score (0-10 with 10 being normal): 10 IMPRESSION: 1. No acute intracranial abnormality. 2. Age-related cerebral atrophy with moderate chronic microvascular ischemic disease. 3. ASPECTS is 10. Results were called by telephone at the time of interpretation on 03/13/2023 at 9:08 pm to provider [facility]  , who verbally acknowledged these results. Electronically Signed   By: Rise Mu M.D.   On: 03/13/2023 21:10    Procedures Procedures  {Document cardiac monitor, telemetry assessment procedure when appropriate:1}  Medications Ordered in ED Medications  iohexol (OMNIPAQUE) 350 MG/ML injection 100 mL (100 mLs Intravenous Contrast Given 03/13/23 2141)    ED Course/ Medical Decision Making/ A&P   {  CRITICAL CARE Performed by: Bethann Berkshire Total critical care time: 45 minutes Critical care time was exclusive of separately billable procedures and treating other patients. Critical care was necessary to treat or prevent imminent or life-threatening deterioration. Critical care was time spent personally by me on the following activities: development of treatment plan with patient and/or surrogate as well as nursing, discussions with consultants, evaluation of patient's response to treatment, examination of patient, obtaining history from patient or surrogate, ordering and performing treatments and interventions, ordering and review of laboratory studies, ordering and review of radiographic studies, pulse oximetry and re-evaluation of patient's condition.   Code stroke was called.  Teleneurology saw patient patient had a CT head and CT angio head and neck.  No acute stroke  seen no large vessel occlusion.  I spoke to neurology and they recommend an MRI of the brain.  Patient will need admission to medicine over at [facility] because of needing dialysis tomorrow Click here for ABCD2, HEART and other calculatorsREFRESH Note before signing :1}                              Medical Decision Making Amount and/or Complexity of Data Reviewed Labs: ordered. Radiology: ordered.  Risk Prescription drug management.  Patient with mental status and weakness mainly in his right arm and right leg.  Mri pending  {Document critical care time when appropriate:1} {Document review of labs and clinical decision tools ie heart score, Chads2Vasc2 etc:1}  {Document your independent review of radiology images, and any outside records:1} {Document your discussion with family members, caretakers, and with consultants:1} {Document social determinants of health affecting pt's care:1} {Document your decision making why or why not admission, treatments were  needed:1} Final Clinical Impression(s) / ED Diagnoses Final diagnoses:  None    Rx / DC Orders ED Discharge Orders     None

## 2023-03-13 NOTE — ED Notes (Signed)
CODE STROKE@20 :48pm.

## 2023-03-13 NOTE — Progress Notes (Signed)
Patient presented to ED with AMS and weakness, Elert at 2048, Per family LKW 1730. mRs is 3 due to comorbid conditions.  Patient is taking eliquis.  Patient to CT at 2055, paged TS at 2056.  Back from CT at 2100. TS Dr. Graciela Husbands on at  2100.  NIHSS at 2113 No lytic due to anticoagulant use.  Off call at 2126.

## 2023-03-13 NOTE — Consult Note (Addendum)
ADDENDUM:   Advanced Imaging:  CTA Head and Neck Completed.  CTP Completed.  LVO:No   [facility] Consult [facility]   Patient Name:   Ronald Reeves, Ronald Reeves Date of Birth:   [birth year] Identification Number:   MRN - [id number] Date of Service:   03/13/2023 20:56:33  Diagnosis:       M62.81 - Generalized Muscle Weakness  Impression:      67 y/o M with multiple medical comorbidities, including HTN, HLD, CAD, CHF, polysubstance abuse, ESRD on HD, on Eliquis due to clot in tunneled dialysis catheter, now presents to hospital with abrupt onset lightheaded dizziness, diffuse weakness (but right side weaker than left side), lethargy and dysarthria. On examination, patient is lethargic, drift of both arms and legs, but RUE and RLE appear to drift faster to the bed than the left side, also reports reduced sensation of right leg. Overall NIHSS is 12. NCHCT did not show acute abnormalities. Patient is not thrombolytic candidate since he is on Eliquis, wife reports that last dose was taken earlier. Suspect relative hypotension, and or possible metabolic/infectious or even cardiac etiology to his symptoms. Nonetheless, stroke needs to be considered as well. Given his degree of lethargy, although I don't have high suspicion for LVO, would still recommend ruling it out. CTA/P ordered, currently pending.    Will f/u CTA/P, if LVO apparent will contact [facility]. Otherwise, recommend admission for further evaluation.  Our recommendations are outlined below.  Recommendations:        Stroke/Telemetry Floor       Neuro Checks       Bedside Swallow Eval       DVT Prophylaxis       IV Fluids, Normal Saline       Head of Bed 30 Degrees       Euglycemia and Avoid Hyperthermia (PRN Acetaminophen)       Initiate or continue Aspirin 81 MG daily       -would continue Eliquis for now, unless CTA/P suggests large core infarct that would be at risk of hemorrhagic conversion       -MRI brain  w/o contrast       -metabolic/infectious work-up  Sign Out:       Discussed with Emergency Department Provider    ------------------------------------------------------------------------------  Advanced Imaging: Advanced imaging has been ordered. Results pending.   Metrics: Last Known Well: 03/13/2023 17:30:00 [facility] Notification Time: 03/13/2023 20:56:33 Arrival Time: 03/13/2023 19:31:00 Stamp Time: 03/13/2023 20:56:33 Initial Response Time: 03/13/2023 21:00:07 Symptoms: weakness. Initial patient interaction: 03/13/2023 21:03:57 NIHSS Assessment Completed: 03/13/2023 21:13:59 Patient is not a candidate for Thrombolytic. Thrombolytic Medical Decision: 03/13/2023 21:16:00 Patient was not deemed candidate for Thrombolytic because of following reasons: Use of NOAs within 48 hours.  CT head showed no acute hemorrhage or acute core infarct. I personally Reviewed the CT Head and it Showed no acute intracranial abnormalities  Primary Provider Notified of Diagnostic Impression and Management Plan on: 03/13/2023 21:37:53    ------------------------------------------------------------------------------  History of Present Illness: Patient is a 67 year old Male.  Patient was brought by private transportation with symptoms of weakness. Patient is accompanied by his wife, who provides collateral information. Patient himself is very lethargic and difficult to obtain detailed history from him. Patient's wife reports that she got home around 4:30 PM, then between 5-5:30 PM, patient started to complain to her that he was feeling dizzy. She saw him, and he looked unwell. He sat down, at one point he was feeling better after eating  something. However, he started to become more dizzy once again (described as lightheaded) and overall weaker, his wife also reports that he became less responsive. His blood pressure was lower than normal at home. She drove him to hospital for further  evaluation. Patient does feel that his right side is weaker than his left.    Past Medical History:      Hypertension      Hyperlipidemia      Coronary Artery Disease O[place] PMH:  ESRD on HD; CHF; COPD; recent clot in tunneled dialysis catheter, for which he was started on Eliquis  Medications:  Anticoagulant use:  Yes Eliquis 5 mg BID Antiplatelet use: Yes aspirin 81 mg daily Reviewed EMR for current medications  Allergies:  Reviewed  Social History: Patient Is: Married Current Employee : cocaine, marijuana use Smoking: Yes Alcohol Use: Yes Drug Use: Yes  Family History:  There is no family history of premature cerebrovascular disease pertinent to this consultation  ROS : 14 Points Review of Systems was performed and was negative except mentioned in HPI.  Past Surgical History: There Is No Surgical History Contributory To Today's Visit     Examination: BP(125/63), Pulse(74), Blood Glucose(136) 1A: Level of Consciousness - Requires repeated stimulation to arouse + 2 1B: Ask Month and Age - Both Questions Right + 0 1C: Blink Eyes & Squeeze Hands - Performs Both Tasks + 0 2: Test Horizontal Extraocular Movements - Normal + 0 3: Test Visual Fields - No Visual Loss + 0 4: Test Facial Palsy (Use Grimace if Obtunded) - Normal symmetry + 0 5A: Test Left Arm Motor Drift - Drift, hits bed + 2 5B: Test Right Arm Motor Drift - Drift, hits bed + 2 6A: Test Left Leg Motor Drift - Drift, hits bed + 2 6B: Test Right Leg Motor Drift - Drift, hits bed + 2 7: Test Limb Ataxia (FNF/Heel-Shin) - No Ataxia + 0 8: Test Sensation - Mild-Moderate Loss: Less Sharp/More Dull + 1 9: Test Language/Aphasia - Normal; No aphasia + 0 10: Test Dysarthria - Mild-Moderate Dysarthria: Slurring but can be understood + 1 11: Test Extinction/Inattention - No abnormality + 0  NIHSS Score: 12  NIHSS Free Text : reduced sensation of right leg compared to left    RUE and RLE appear to drift faster to  the bed compared to the left side  Pre-Morbid Modified Rankin Scale: 3 Points = Moderate disability; requiring some help, but able to walk without assistance  Spoke with : Dr. Estell Harpin, over Epic messaging system  This consult was conducted in real time using interactive audio and Immunologist. Patient was informed of the technology being used for this visit and agreed to proceed. Patient located in hospital and provider located at home/office setting.   Patient is being evaluated for possible acute neurologic impairment and high probability of imminent or life-threatening deterioration. I spent total of 48 minutes providing care to this patient, including time for face to face visit via telemedicine, review of medical records, imaging studies and discussion of findings with providers, the patient and/or family.   Dr Peri Jefferson   [facility] For Inpatient follow-up with [facility] physician please call [facility] [phone]. This is not an outpatient service. Post hospital discharge, please contact hospital directly.  Please do not communicate with [facility] physicians via secure chat. If you have any questions, Please contact [facility]. Please call or reconsult our service if there are any clinical or diagnostic changes.

## 2023-03-13 NOTE — ED Notes (Signed)
Patient transported to CT at this time. 

## 2023-03-13 NOTE — ED Triage Notes (Addendum)
Pt complaining of feeling sleepy. Wife said that he is not acting himself.  He is answering questions to me. Said that he is sleepy, dizzy, and feeling really weak. He went to the hospital earlier today for the same, started feeling better and he went home. When he got home he started to not respond normally to the wife.

## 2023-03-14 DIAGNOSIS — E785 Hyperlipidemia, unspecified: Secondary | ICD-10-CM | POA: Diagnosis present

## 2023-03-14 DIAGNOSIS — G9341 Metabolic encephalopathy: Secondary | ICD-10-CM

## 2023-03-14 DIAGNOSIS — Z7901 Long term (current) use of anticoagulants: Secondary | ICD-10-CM | POA: Diagnosis not present

## 2023-03-14 DIAGNOSIS — I2489 Other forms of acute ischemic heart disease: Secondary | ICD-10-CM | POA: Diagnosis present

## 2023-03-14 DIAGNOSIS — Z87891 Personal history of nicotine dependence: Secondary | ICD-10-CM | POA: Diagnosis not present

## 2023-03-14 DIAGNOSIS — Z86718 Personal history of other venous thrombosis and embolism: Secondary | ICD-10-CM

## 2023-03-14 DIAGNOSIS — Z5329 Procedure and treatment not carried out because of patient's decision for other reasons: Secondary | ICD-10-CM | POA: Diagnosis present

## 2023-03-14 DIAGNOSIS — D638 Anemia in other chronic diseases classified elsewhere: Secondary | ICD-10-CM

## 2023-03-14 DIAGNOSIS — N2581 Secondary hyperparathyroidism of renal origin: Secondary | ICD-10-CM | POA: Diagnosis present

## 2023-03-14 DIAGNOSIS — K219 Gastro-esophageal reflux disease without esophagitis: Secondary | ICD-10-CM | POA: Diagnosis present

## 2023-03-14 DIAGNOSIS — G6289 Other specified polyneuropathies: Secondary | ICD-10-CM

## 2023-03-14 DIAGNOSIS — G928 Other toxic encephalopathy: Secondary | ICD-10-CM | POA: Diagnosis present

## 2023-03-14 DIAGNOSIS — I2510 Atherosclerotic heart disease of native coronary artery without angina pectoris: Secondary | ICD-10-CM | POA: Diagnosis present

## 2023-03-14 DIAGNOSIS — I10 Essential (primary) hypertension: Secondary | ICD-10-CM

## 2023-03-14 DIAGNOSIS — Z8673 Personal history of transient ischemic attack (TIA), and cerebral infarction without residual deficits: Secondary | ICD-10-CM

## 2023-03-14 DIAGNOSIS — G629 Polyneuropathy, unspecified: Secondary | ICD-10-CM | POA: Diagnosis present

## 2023-03-14 DIAGNOSIS — J449 Chronic obstructive pulmonary disease, unspecified: Secondary | ICD-10-CM | POA: Diagnosis present

## 2023-03-14 DIAGNOSIS — I5020 Unspecified systolic (congestive) heart failure: Secondary | ICD-10-CM

## 2023-03-14 DIAGNOSIS — Z7982 Long term (current) use of aspirin: Secondary | ICD-10-CM | POA: Diagnosis not present

## 2023-03-14 DIAGNOSIS — N186 End stage renal disease: Secondary | ICD-10-CM | POA: Diagnosis present

## 2023-03-14 DIAGNOSIS — N400 Enlarged prostate without lower urinary tract symptoms: Secondary | ICD-10-CM | POA: Diagnosis present

## 2023-03-14 DIAGNOSIS — Z8249 Family history of ischemic heart disease and other diseases of the circulatory system: Secondary | ICD-10-CM | POA: Diagnosis not present

## 2023-03-14 DIAGNOSIS — T405X1A Poisoning by cocaine, accidental (unintentional), initial encounter: Secondary | ICD-10-CM | POA: Diagnosis present

## 2023-03-14 DIAGNOSIS — I5042 Chronic combined systolic (congestive) and diastolic (congestive) heart failure: Secondary | ICD-10-CM

## 2023-03-14 DIAGNOSIS — Z79899 Other long term (current) drug therapy: Secondary | ICD-10-CM | POA: Diagnosis not present

## 2023-03-14 DIAGNOSIS — R7989 Other specified abnormal findings of blood chemistry: Secondary | ICD-10-CM | POA: Diagnosis not present

## 2023-03-14 DIAGNOSIS — I9589 Other hypotension: Secondary | ICD-10-CM | POA: Diagnosis present

## 2023-03-14 DIAGNOSIS — Z992 Dependence on renal dialysis: Secondary | ICD-10-CM | POA: Diagnosis not present

## 2023-03-14 DIAGNOSIS — D631 Anemia in chronic kidney disease: Secondary | ICD-10-CM | POA: Diagnosis present

## 2023-03-14 DIAGNOSIS — I252 Old myocardial infarction: Secondary | ICD-10-CM | POA: Diagnosis not present

## 2023-03-14 DIAGNOSIS — I5040 Unspecified combined systolic (congestive) and diastolic (congestive) heart failure: Secondary | ICD-10-CM

## 2023-03-14 DIAGNOSIS — F1490 Cocaine use, unspecified, uncomplicated: Secondary | ICD-10-CM

## 2023-03-14 DIAGNOSIS — I132 Hypertensive heart and chronic kidney disease with heart failure and with stage 5 chronic kidney disease, or end stage renal disease: Secondary | ICD-10-CM | POA: Diagnosis present

## 2023-03-14 LAB — COMPREHENSIVE METABOLIC PANEL
ALT: 13 U/L (ref 0–44)
AST: 18 U/L (ref 15–41)
Albumin: 3.1 g/dL — ABNORMAL LOW (ref 3.5–5.0)
Alkaline Phosphatase: 87 U/L (ref 38–126)
Anion gap: 13 (ref 5–15)
BUN: 40 mg/dL — ABNORMAL HIGH (ref 8–23)
CO2: 24 mmol/L (ref 22–32)
Calcium: 9.3 mg/dL (ref 8.9–10.3)
Chloride: 99 mmol/L (ref 98–111)
Creatinine, Ser: 8.34 mg/dL — ABNORMAL HIGH (ref 0.61–1.24)
GFR, Estimated: 7 mL/min — ABNORMAL LOW (ref >60)
Glucose, Bld: 96 mg/dL (ref 70–99)
Potassium: 4.9 mmol/L (ref 3.5–5.1)
Sodium: 136 mmol/L (ref 135–145)
Total Bilirubin: 0.4 mg/dL (ref 0.3–1.2)
Total Protein: 7 g/dL (ref 6.5–8.1)

## 2023-03-14 LAB — CBC
HCT: 32.1 % — ABNORMAL LOW (ref 39.0–52.0)
Hemoglobin: 10.1 g/dL — ABNORMAL LOW (ref 13.0–17.0)
MCH: 27.7 pg (ref 26.0–34.0)
MCHC: 31.5 g/dL (ref 30.0–36.0)
MCV: 88.2 fL (ref 80.0–100.0)
Platelets: 171 10*3/uL (ref 150–400)
RBC: 3.64 MIL/uL — ABNORMAL LOW (ref 4.22–5.81)
RDW: 15.4 % (ref 11.5–15.5)
WBC: 6.2 10*3/uL (ref 4.0–10.5)
nRBC: 0 % (ref 0.0–0.2)

## 2023-03-14 LAB — LIPID PANEL
Cholesterol: 141 mg/dL (ref 0–200)
HDL: 61 mg/dL (ref >40)
LDL Cholesterol: 70 mg/dL (ref 0–99)
Total CHOL/HDL Ratio: 2.3 RATIO
Triglycerides: 48 mg/dL (ref <150)
VLDL: 10 mg/dL (ref 0–40)

## 2023-03-14 LAB — HEMOGLOBIN A1C
Hgb A1c MFr Bld: 5.8 % — ABNORMAL HIGH (ref 4.8–5.6)
Mean Plasma Glucose: 119.76 mg/dL

## 2023-03-14 LAB — TROPONIN I (HIGH SENSITIVITY)
Troponin I (High Sensitivity): 44 ng/L — ABNORMAL HIGH (ref <18)
Troponin I (High Sensitivity): 39 ng/L — ABNORMAL HIGH (ref <18)

## 2023-03-14 LAB — MRSA NEXT GEN BY PCR, NASAL: MRSA by PCR Next Gen: NOT DETECTED

## 2023-03-14 MED ORDER — ATORVASTATIN CALCIUM 10 MG PO TABS: 10.0000 mg | ORAL_TABLET | Freq: Every day | ORAL | Status: DC

## 2023-03-14 MED ORDER — HYDRALAZINE HCL 25 MG PO TABS: 25.0000 mg | ORAL_TABLET | ORAL | Status: DC

## 2023-03-14 MED ORDER — ISOSORBIDE MONONITRATE ER 30 MG PO TB24: 30.0000 mg | ORAL_TABLET | Freq: Every day | ORAL | Status: DC

## 2023-03-14 MED ORDER — ONDANSETRON HCL 4 MG PO TABS: 4.0000 mg | ORAL_TABLET | Freq: Four times a day (QID) | ORAL | Status: DC | PRN

## 2023-03-14 MED ORDER — ACETAMINOPHEN 650 MG RE SUPP: 650.0000 mg | Freq: Four times a day (QID) | RECTAL | Status: DC | PRN

## 2023-03-14 MED ORDER — ACETAMINOPHEN 325 MG PO TABS: 650.0000 mg | ORAL_TABLET | Freq: Four times a day (QID) | ORAL | Status: DC | PRN

## 2023-03-14 MED ORDER — AMLODIPINE BESYLATE 5 MG PO TABS: 10.0000 mg | ORAL_TABLET | Freq: Every day | ORAL | Status: DC

## 2023-03-14 MED ORDER — CHLORHEXIDINE GLUCONATE CLOTH 2 % EX PADS
6.0000 | MEDICATED_PAD | Freq: Every day | CUTANEOUS | Status: DC
  Administered 2023-03-14: 6 via TOPICAL

## 2023-03-14 MED ORDER — HEPARIN SODIUM (PORCINE) 1000 UNIT/ML DIALYSIS: 2000.0000 [IU] | Freq: Once | INTRAMUSCULAR | Status: DC

## 2023-03-14 MED ORDER — ASPIRIN 81 MG PO TBEC: 81.0000 mg | DELAYED_RELEASE_TABLET | Freq: Every day | ORAL | Status: DC

## 2023-03-14 MED ORDER — APIXABAN 5 MG PO TABS
5.0000 mg | ORAL_TABLET | Freq: Two times a day (BID) | ORAL | Status: DC
  Administered 2023-03-14: 5 mg via ORAL
  Filled 2023-03-14: qty 1

## 2023-03-14 MED ORDER — PANTOPRAZOLE SODIUM 40 MG PO TBEC: 40.0000 mg | DELAYED_RELEASE_TABLET | Freq: Every day | ORAL | Status: DC

## 2023-03-14 MED ORDER — SODIUM CHLORIDE 0.9% FLUSH: 3.0000 mL | INTRAVENOUS | Status: DC | PRN

## 2023-03-14 MED ORDER — SEVELAMER CARBONATE 800 MG PO TABS
1600.0000 mg | ORAL_TABLET | Freq: Three times a day (TID) | ORAL | Status: DC
  Administered 2023-03-14: 1600 mg via ORAL
  Filled 2023-03-14: qty 2

## 2023-03-14 MED ORDER — SODIUM CHLORIDE 0.9% FLUSH: 3.0000 mL | Freq: Two times a day (BID) | INTRAVENOUS | Status: DC

## 2023-03-14 MED ORDER — HEPARIN SODIUM (PORCINE) 1000 UNIT/ML DIALYSIS: 2000.0000 [IU] | INTRAMUSCULAR | Status: DC | PRN

## 2023-03-14 MED ORDER — SODIUM CHLORIDE 0.9 % IV SOLN: 250.0000 mL | INTRAVENOUS | Status: DC | PRN

## 2023-03-14 MED ORDER — HYDRALAZINE HCL 25 MG PO TABS: 25.0000 mg | ORAL_TABLET | Freq: Every evening | ORAL | Status: DC

## 2023-03-14 MED ORDER — ONDANSETRON HCL 4 MG/2ML IJ SOLN: 4.0000 mg | Freq: Four times a day (QID) | INTRAMUSCULAR | Status: DC | PRN

## 2023-03-14 NOTE — Progress Notes (Signed)
Seen and examined by Dr. Geraldo Pitter.

## 2023-03-14 NOTE — Hospital Course (Addendum)
Wife present at bedside, would like dialysis scheduled for today.  Wife reports he s substance abuser, and uses crack, 2 times day. Wife reports he possibly used twice the amount he usually uses. Patent is alert and oriented to location, name, DOB, states he is here for HTN. Wife repots blood in stool, no bright red, states in between. No hematemesis. No dysuria or hematuria. Patient has catheter on Right upper leg.   Patient is somnolent exam, responds to questions only. States his tooth has been hurting, going on for couple of months. Denies numbness, SOB, CP. He ate this morning. Denies weakness of his limbs. Patient states burning with urination.   CN: II-XI: intact, do not know if opened his eyes fully. Unable to do full exam.

## 2023-03-14 NOTE — ED Notes (Signed)
Celeste NT informed this RN that she had straight cathed the patient without success. Pt refused to let this RN attempt to do in and out cath to obtain urine sample. Dr. Eudelia Bunch notified.

## 2023-03-14 NOTE — Progress Notes (Addendum)
Patient is expressing the desire to leave the hospital. Patient is currently alert and oriented x4. He states that he wants to make his outpatient HD appointment by 1000. This RN paged Dr. Allena Katz. Dr. Allena Katz states that she is going to come to bedside to speak with patient; however, patient states that he has already made his decision to leave. Patient is aware that he will be leaving Against Medical Advice. Patient signed AMA paperwork in the presence of this RN. Patient removed telemetry monitor and requested RN to removed IV.   Update 0921: Dr. Allena Katz to bedside; explained risks of leaving AMA to patient. Patient expressed understanding and continues to endorse desire to leave the hospital against medical advice.

## 2023-03-14 NOTE — Progress Notes (Signed)
Pt transferred from [facility] to [facility] after being admitted for stroke workup and prolonged encephalopathy.  Patient arrived at about 6 AM this morning and I went to see the patient to briefly evaluate his symptoms and reviewed orders.  Patient was alert and oriented x 4 but still tired and fell asleep a couple times during our brief interaction.  He does have decreased strength on the right upper and lower extremity as well as less movement of his left eyelid but otherwise cranial nerves II through XII are grossly intact.  He does have left upper and lower extremity weakness but this is mild in the right side is worse.  Based on neurology note from [facility] his weakness appears stable but his mental status appears improved.  Confirmed with nursing that [facility] will be taking over care of this patient they are here.  Discussed with the day team who will put in a formal progress note today.  Rocky Morel, DO Internal Medicine Resident, PGY-2 Pager# [phone] Please contact the on call pager after 5 pm and on weekends at [phone].

## 2023-03-14 NOTE — Progress Notes (Signed)
Patient called desk stating he was wanting to leave to go to his HD appointment at 10 am at his outpatient HD center.  Nephrology at bedside.  Patient answering orientation questions appropriately.  Patient encouraged to stay as he is scheduled for HD today and also educated on need to stay in hospital until released by MD.  Patient telling wife to help him put on clothes, patient removed telemetry.    Charge RN notified Dr Modena Slater, MD states he will come speak to patient.  Patient encouraged to stay until MD arrives.

## 2023-03-14 NOTE — Progress Notes (Addendum)
AMA discussion.   Called back up to bedside to evaluate patient as patient was trying to leave AMA.  Did talk to the patient about risk of leaving such as death, worsening injuries, and illness.  Patient understands the risk.  Patient was deemed competent to make his own decisions.  Wife at bedside who was planning to take patient home.  His reasoning for leaving is because he wanted to go to his dialysis appointment at 10 AM.  He voiced understanding of the risks of leaving AGAINST MEDICAL ADVICE.  Patient accepted these risks and decided to leave AGAINST MEDICAL ADVICE.

## 2023-03-14 NOTE — H&P (Signed)
History and Physical    Ronald Reeves WUJ:[id number] DOB: 18-Dec-1955 DOA: 03/13/2023  PCP: Benjiman Core, MD   Patient coming from: Home   Chief Complaint:  Chief Complaint  Patient presents with   Hypotension    HPI:  Ronald Reeves is a 67 y.o. 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  Patient is brought up to the ED by patient's wife as patient found more confused throughout the day.  She reported that patient snorted crack cocaine earlier today and after that he developed drowsiness and more confused.  He also complaining of right-sided weakness.  Patient's wife reported that he was doing fine and his usual state of health until this morning.  Patient denies any chest pain, chest pressure, shortness of breath, palpitation, abdominal pain, nausea, change of weight and appetite.  Patient is alert and oriented to name and place only..  Able to follow voice commands and responding to pain.   ED Course:  Initial presentation to ED heart rate 77, respiratory 12, blood pressure 109/61 and O2 sat 95% room air. Lab check showed blood glucose 136. CBC unremarkable stable H&H 10.3 and 32. Blood alcohol level less than 10. CMP grossly unremarkable except evidence of ESRD BUN 36.  High sensitive troponin 40 and 39.  EKG showed sinus rhythm with evidence of right atrial enlargement, heart rate 79 and no ST and T wave abnormality.  With the concern for stroke EDP physician reached out to on-call neurology Dr. Graciela Husbands  recommended CTA head, MRI and CT angiogram.  All the imaging has been ruled out any evidence of a stroke.  During my  evaluation patient is drowsy however able to open his eye with voice command and sternal rub.  Sleepy secondary to cocaine use.  Patient's wife at the bedside who helped with all the questions and gave detailed history of the patient as mentioned above.  Hospitalist team has been consulted to admit patient for improvement of mentation and dialysis requirement.  Review of Systems:  Review of Systems  Constitutional:  Negative for chills, fever, malaise/fatigue and weight loss.  HENT:  Negative for hearing loss and tinnitus.   Eyes:  Negative for blurred vision and double vision.  Respiratory:  Negative for cough and shortness of breath.   Cardiovascular:  Negative for chest pain, palpitations, orthopnea and leg swelling.  Gastrointestinal:  Negative for abdominal pain, constipation, diarrhea, heartburn, nausea and vomiting.  Musculoskeletal:  Negative for myalgias.  Neurological:  Negative for dizziness, tremors, speech change, weakness and headaches.  Psychiatric/Behavioral:  Positive for substance abuse. Negative for depression and hallucinations. The patient is not nervous/anxious.     Past Medical History:  Diagnosis Date   Anemia    Arthritis    Back pain    Bronchitis    COPD (chronic obstructive pulmonary disease) (HCC)    Coronary artery disease    COVID    mild - flu like symptoms   Dyspnea    w/ exertion, uses inhaler   ESRD on hemodialysis (HCC)    dialysis on tues, thurs, sat at [facility]   GERD (gastroesophageal reflux disease)    not a current problem   Heart murmur    never has caused  any problems   Hypertension    Myocardial infarction (HCC)    Pneumonia    x 1   Pre-diabetes    diet controlled, no meds, does not check blood sugar   Substance abuse (HCC)     Past Surgical History:  Procedure Laterality Date   AV FISTULA PLACEMENT Left 04/12/2021   Procedure: LEFT ARM ARTERIOVENOUS (AV) FISTULA CREATION;  Surgeon: Cephus Shelling, MD;  Location: [facility] OR;   Service: Vascular;  Laterality: Left;   AV FISTULA PLACEMENT Left 10/18/2021   Procedure: LEFT ARM BRACHIOBASILIC FISTULA CREATION FIRST STAGE;  Surgeon: Cephus Shelling, MD;  Location: [facility] OR;  Service: Vascular;  Laterality: Left;   AV FISTULA PLACEMENT Right 11/28/2022   Procedure: RIGHT ARM FIRST STAGE BRACHIOBASILIC FISTULA CREATION;  Surgeon: Cephus Shelling, MD;  Location: [facility] OR;  Service: Vascular;  Laterality: Right;   BACK SURGERY     BASCILIC VEIN TRANSPOSITION Left 01/27/2022   Procedure: LEFT SECOND STAGE BASILIC VEIN TRANSPOSITION;  Surgeon: Cephus Shelling, MD;  Location: [facility] OR;  Service: Vascular;  Laterality: Left;   BASCILIC VEIN TRANSPOSITION Right 02/18/2023   Procedure: SECOND STAGE RIGHT ARM BASILIC VEIN TRANSPOSITION;  Surgeon: Cephus Shelling, MD;  Location: [facility] OR;  Service: Vascular;  Laterality: Right;   BIOPSY  08/20/2022   Procedure: BIOPSY;  Surgeon: Shellia Cleverly, DO;  Location: [facility];  Service: Gastroenterology;;   COLONOSCOPY WITH PROPOFOL N/A 08/20/2022   Procedure: COLONOSCOPY WITH PROPOFOL;  Surgeon: Shellia Cleverly, DO;  Location: [facility];  Service: Gastroenterology;  Laterality: N/A;   DIALYSIS/PERMA CATHETER INSERTION Right 03/05/2023   Procedure: DIALYSIS/PERMA CATHETER INSERTION;  Surgeon: Cephus Shelling, MD;  Location: [facility] INVASIVE CV LAB;  Service: Cardiovascular;  Laterality: Right;   ESOPHAGOGASTRODUODENOSCOPY (EGD) WITH PROPOFOL N/A 08/20/2022   Procedure: ESOPHAGOGASTRODUODENOSCOPY (EGD) WITH PROPOFOL;  Surgeon: Shellia Cleverly, DO;  Location: [facility];  Service: Gastroenterology;  Laterality: N/A;   INSERTION OF DIALYSIS CATHETER Right 05/01/2022   Procedure: INSERTION OF DIALYSIS CATHETER;  Surgeon: [name], MD;  Location: [facility] OR;  Service: Vascular;  Laterality: Right;   IR REMOVAL TUN CV CATH W/O FL  12/19/2022   LIGATION OF COMPETING BRANCHES OF ARTERIOVENOUS FISTULA Left 07/15/2021   Procedure:  LIGATION OF COMPETING BRANCHES OF LEFT RADIOCEPHALIC ARTERIOVENOUS FISTULA TIMES TWO;  Surgeon: Cephus Shelling, MD;  Location: [facility] OR;  Service: Vascular;  Laterality: Left;   POLYPECTOMY  08/20/2022   Procedure: POLYPECTOMY;  Surgeon: Shellia Cleverly, DO;  Location: [facility];  Service: Gastroenterology;;   REVISON OF ARTERIOVENOUS FISTULA Left 07/15/2021   Procedure: REVISON OF LEFT ARTERIOVENOUS FISTULA;  Surgeon: Cephus Shelling, MD;  Location: [facility] OR;  Service: Vascular;  Laterality: Left;   REVISON OF ARTERIOVENOUS FISTULA Left 05/01/2022   Procedure: ARTERIOVENOUS FISTULA WASHOUT OF ARM HEMATOMA;  Surgeon: [name], MD;  Location: [facility] OR;  Service: Vascular;  Laterality: Left;     reports that he has been smoking cigars and cigarettes. He has been exposed to tobacco smoke. He has never used smokeless tobacco. He reports current alcohol use of about 14.0 standard drinks of alcohol per week. He reports current drug use. Frequency: 1.00 time per week. Drugs: Marijuana and Cocaine.  No Known Allergies  Family History  Problem Relation Age of Onset   Heart disease Mother    Heart attack Sister 63    Prior to Admission medications   Medication Sig Start Date End Date Taking?  Authorizing Provider  albuterol (VENTOLIN HFA) 108 (90 Base) MCG/ACT inhaler Inhale 2 puffs into the lungs every 6 (six) hours as needed for wheezing or shortness of breath.    [provider]  amLODipine (NORVASC) 10 MG tablet Take 1 tablet (10 mg total) by mouth daily. 01/01/18   Lewayne Bunting, MD  apixaban (ELIQUIS) 5 MG TABS tablet Take 1 tablet (5 mg total) by mouth 2 (two) times daily. 03/06/23   Atway, Derwood Kaplan, DO  aspirin EC 81 MG tablet Take 81 mg by mouth daily.    [provider]  Blood Pressure KIT Please check blood pressure every day, and keep a log. 12/07/22   Arrien, York Ram, MD  diclofenac Sodium (VOLTAREN) 1 % GEL Apply 1 Application topically 2 (two)  times daily as needed (pain).    [provider]  furosemide (LASIX) 40 MG tablet Take 40 mg by mouth 2 (two) times daily.    [provider]  gabapentin (NEURONTIN) 300 MG capsule Take 300 mg by mouth 2 (two) times daily. 12/31/22   [provider]  hydrALAZINE (APRESOLINE) 25 MG tablet Take 1 tablet (25 mg total) by mouth daily. Patient taking differently: Take 25 mg by mouth See admin instructions. 25 mg twice daily, except 1 tablet daily on dialysis days (taken after dialysis). 12/07/22   Arrien, York Ram, MD  isosorbide mononitrate (IMDUR) 30 MG 24 hr tablet Take 1 tablet (30 mg total) by mouth daily. 01/01/18   Lewayne Bunting, MD  labetalol (NORMODYNE) 100 MG tablet Take 2 tablets (200 mg total) by mouth every morning. Patient taking differently: Take 100 mg by mouth See admin instructions. 100 mg twice daily, except 1 tablet daily on dialysis days (taken after dialysis). 01/01/18   Lewayne Bunting, MD  lidocaine (LIDODERM) 5 % Place 1 patch onto the skin daily as needed (pain). Remove & Discard patch within 12 hours or as directed by MD    [provider]  multivitamin (RENA-VIT) TABS tablet Take 1 tablet by mouth daily.    [provider]  omeprazole (PRILOSEC) 40 MG capsule Take 1 capsule (40 mg total) by mouth daily. Patient taking differently: Take 40 mg by mouth 2 (two) times daily as needed. 06/06/22   Arnaldo Natal, NP  sevelamer carbonate (RENVELA) 800 MG tablet Take 1,600 mg by mouth 3 (three) times daily with meals. 01/06/23   [provider]  tamsulosin (FLOMAX) 0.4 MG CAPS capsule Take 0.4 mg by mouth 2 (two) times daily.    [provider]     Physical Exam: Vitals:   03/14/23 0230 03/14/23 0245 03/14/23 0300 03/14/23 0315  BP: 134/60 (!) 131/58 127/63 131/66  Pulse: 68 67 65 65  Resp: 10 13 12 16   Temp:      TempSrc:      SpO2: 98% 97% 95% 95%  Weight:      Height:        Physical  Exam Constitutional:      General: He is not in acute distress.    Appearance: He is not ill-appearing.  HENT:     Mouth/Throat:     Mouth: Mucous membranes are moist.  Eyes:     Pupils: Pupils are equal, round, and reactive to light.  Cardiovascular:     Rate and Rhythm: Normal rate and regular rhythm.     Pulses: Normal pulses.     Heart sounds: Normal heart sounds.  Pulmonary:  Effort: Pulmonary effort is normal.     Breath sounds: Normal breath sounds.  Abdominal:     General: Bowel sounds are normal.  Musculoskeletal:     Cervical back: Neck supple.     Right lower leg: No edema.     Left lower leg: No edema.     Comments: Right-sided AV fistula palpable thrill and bruit.  Skin:    General: Skin is warm.     Capillary Refill: Capillary refill takes less than 2 seconds.  Neurological:     Cranial Nerves: No cranial nerve deficit.     Sensory: No sensory deficit.     Motor: No weakness.     Coordination: Coordination normal.  Psychiatric:        Mood and Affect: Mood normal.      Labs on Admission: I have personally reviewed following labs and imaging studies  CBC: Recent Labs  Lab 03/13/23 2051 03/13/23 2141  WBC 5.4  --   NEUTROABS 2.4  --   HGB 10.3* 10.9*  HCT 32.7* 32.0*  MCV 87.7  --   PLT 169  --    Basic Metabolic Panel: Recent Labs  Lab 03/13/23 2053 03/13/23 2141  NA 137 138  K 4.7 4.8  CL 100 103  CO2 24  --   GLUCOSE 147* 112*  BUN 36* 34*  CREATININE 7.83* 8.80*  CALCIUM 9.9  --    GFR: Estimated Creatinine Clearance: 7.8 mL/min (A) (by C-G formula based on SCr of 8.8 mg/dL (H)). Liver Function Tests: Recent Labs  Lab 03/13/23 2053  AST 22  ALT 14  ALKPHOS 70  BILITOT 0.6  PROT 7.9  ALBUMIN 3.4*   No results for input(s): "LIPASE", "AMYLASE" in the last 168 hours. No results for input(s): "AMMONIA" in the last 168 hours. Coagulation Profile: Recent Labs  Lab 03/13/23 2053  INR 1.2   Cardiac Enzymes: Recent Labs   Lab 03/13/23 2053 03/13/23 2345  TROPONINIHS 40* 39*   BNP (last 3 results) Recent Labs    12/17/22 0545  BNP >4,500.0*   HbA1C: No results for input(s): "HGBA1C" in the last 72 hours. CBG: Recent Labs  Lab 03/13/23 2050  GLUCAP 136*   Lipid Profile: No results for input(s): "CHOL", "HDL", "LDLCALC", "TRIG", "CHOLHDL", "LDLDIRECT" in the last 72 hours. Thyroid Function Tests: No results for input(s): "TSH", "T4TOTAL", "FREET4", "T3FREE", "THYROIDAB" in the last 72 hours. Anemia Panel: No results for input(s): "VITAMINB12", "FOLATE", "FERRITIN", "TIBC", "IRON", "RETICCTPCT" in the last 72 hours. Urine analysis:    Component Value Date/Time   COLORURINE YELLOW 12/18/2022 0945   APPEARANCEUR HAZY (A) 12/18/2022 0945   LABSPEC 1.023 12/18/2022 0945   PHURINE 5.0 12/18/2022 0945   GLUCOSEU NEGATIVE 12/18/2022 0945   HGBUR SMALL (A) 12/18/2022 0945   BILIRUBINUR NEGATIVE 12/18/2022 0945   KETONESUR NEGATIVE 12/18/2022 0945   PROTEINUR 100 (A) 12/18/2022 0945   UROBILINOGEN 1.0 05/20/2013 0532   NITRITE NEGATIVE 12/18/2022 0945   LEUKOCYTESUR TRACE (A) 12/18/2022 0945    Radiological Exams on Admission: I have personally reviewed images MR BRAIN WO CONTRAST  Result Date: 03/14/2023 CLINICAL DATA:  Initial evaluation for mental status change. EXAM: MRI HEAD WITHOUT CONTRAST TECHNIQUE: Multiplanar, multiecho pulse sequences of the brain and surrounding structures were obtained without intravenous contrast. COMPARISON:  Prior studies from earlier the same day. FINDINGS: Brain: Cerebral volume within normal limits. Patchy T2/FLAIR hyperintensity involving the periventricular deep white matter, consistent with chronic small vessel ischemic disease, moderately advanced  in nature. Few small remote lacunar infarcts present about the right basal ganglia and left thalamus. Small remote right cerebellar infarct noted. No evidence for acute or subacute ischemia. Gray-white matter  differentiation maintained. No acute or chronic intracranial products. No mass lesion, midline shift or mass effect. No hydrocephalus or extra-axial fluid collection. Pituitary gland suprasellar region within normal limits. Vascular: Major intracranial vascular flow voids are maintained. Skull and upper cervical spine: Craniocervical junction within normal limits. Bone marrow signal intensity mildly decreased on T1 weighted imaging, nonspecific, but most commonly related to anemia, smoking, or obesity. Small lipoma noted at the right frontal scalp. Sinuses/Orbits: Globes and orbital soft tissues within normal limits. Few small retention cyst present within the right sphenoid sinus. Paranasal sinuses are otherwise clear. No mastoid effusion. Other: None. IMPRESSION: 1. No acute intracranial abnormality. 2. Moderately advanced chronic microvascular ischemic disease with a few small remote infarcts involving the right basal ganglia, left thalamus, and right cerebellum. Electronically Signed   By: Rise Mu M.D.   On: 03/14/2023 00:11   CT CEREBRAL PERFUSION W CONTRAST  Result Date: 03/13/2023 CLINICAL DATA:  Initial evaluation for stroke. EXAM: CT ANGIOGRAPHY HEAD AND NECK CT PERFUSION BRAIN TECHNIQUE: Multidetector CT imaging of the head and neck was performed using the standard protocol during bolus administration of intravenous contrast. Multiplanar CT image reconstructions and MIPs were obtained to evaluate the vascular anatomy. Carotid stenosis measurements (when applicable) are obtained utilizing NASCET criteria, using the distal internal carotid diameter as the denominator. Multiphase CT imaging of the brain was performed following IV bolus contrast injection. Subsequent parametric perfusion maps were calculated using RAPID software. RADIATION DOSE REDUCTION: This exam was performed according to the departmental dose-optimization program which includes automated exposure control, adjustment of the  mA and/or kV according to patient size and/or use of iterative reconstruction technique. CONTRAST:  OMNIPAQUE IOHEXOL 350 MG/ML SOLN COMPARISON:  CT from earlier the same day.  The FINDINGS: CTA NECK FINDINGS Aortic arch: Standard branching. Imaged portion shows no evidence of aneurysm or dissection. No significant stenosis of the major arch vessel origins. Mild aortic atherosclerosis. Right carotid system: No evidence of dissection, stenosis (50% or greater) or occlusion. Left carotid system: No evidence of dissection, stenosis (50% or greater) or occlusion. Vertebral arteries: Both vertebral arteries arise from the subclavian arteries. No proximal subclavian artery stenosis. Atheromatous plaque at the origin of the right vertebral artery with no more than mild stenosis. Cerebral arteries patent distally without stenosis or dissection. Skeleton: No discrete or worrisome osseous lesions. Findings compatible with dish. Other neck: No other acute finding. Upper chest: No other acute finding. Review of the MIP images confirms the above findings CTA HEAD FINDINGS Anterior circulation: Atheromatous change within the carotid siphons with associated mild to moderate narrowing bilaterally. A1 segments, anterior communicating complex common anterior cerebral arteries patent without stenosis. No M1 stenosis or occlusion. No proximal MCA branch occlusion or high-grade stenosis. Visualized distal MCA branches perfused and symmetric. Posterior circulation: Both V4 segments widely patent. Right PICA patent at its origin. Left PICA not well seen. Basilar patent without stenosis. Superior cerebellar and posterior cerebral arteries patent bilaterally. Venous sinuses: Visualized dural sinuses patent allowing for timing the contrast bolus. Anatomic variants: None significant.  No aneurysm. Review of the MIP images confirms the above findings CT Brain Perfusion Findings: ASPECTS: 10 CBF (<30%) Volume: 0mL Perfusion (Tmax>6.0s)  volume: 0mL Mismatch Volume: 0mL Infarction Location:Negative CT perfusion with no evidence for acute ischemia or other perfusion abnormality.  IMPRESSION: 1. Negative CTA for large vessel occlusion or other emergent finding. 2. Negative CT perfusion with no evidence for acute ischemia or other perfusion abnormality. 3. Atheromatous change within the carotid siphons with associated mild to moderate narrowing bilaterally. Aortic Atherosclerosis (ICD10-I70.0). Electronically Signed   By: Rise Mu M.D.   On: 03/13/2023 22:26   CT ANGIO HEAD NECK W WO CM  Result Date: 03/13/2023 CLINICAL DATA:  Initial evaluation for stroke. EXAM: CT ANGIOGRAPHY HEAD AND NECK CT PERFUSION BRAIN TECHNIQUE: Multidetector CT imaging of the head and neck was performed using the standard protocol during bolus administration of intravenous contrast. Multiplanar CT image reconstructions and MIPs were obtained to evaluate the vascular anatomy. Carotid stenosis measurements (when applicable) are obtained utilizing NASCET criteria, using the distal internal carotid diameter as the denominator. Multiphase CT imaging of the brain was performed following IV bolus contrast injection. Subsequent parametric perfusion maps were calculated using RAPID software. RADIATION DOSE REDUCTION: This exam was performed according to the departmental dose-optimization program which includes automated exposure control, adjustment of the mA and/or kV according to patient size and/or use of iterative reconstruction technique. CONTRAST:  OMNIPAQUE IOHEXOL 350 MG/ML SOLN COMPARISON:  CT from earlier the same day.  The FINDINGS: CTA NECK FINDINGS Aortic arch: Standard branching. Imaged portion shows no evidence of aneurysm or dissection. No significant stenosis of the major arch vessel origins. Mild aortic atherosclerosis. Right carotid system: No evidence of dissection, stenosis (50% or greater) or occlusion. Left carotid system: No evidence of  dissection, stenosis (50% or greater) or occlusion. Vertebral arteries: Both vertebral arteries arise from the subclavian arteries. No proximal subclavian artery stenosis. Atheromatous plaque at the origin of the right vertebral artery with no more than mild stenosis. Cerebral arteries patent distally without stenosis or dissection. Skeleton: No discrete or worrisome osseous lesions. Findings compatible with dish. Other neck: No other acute finding. Upper chest: No other acute finding. Review of the MIP images confirms the above findings CTA HEAD FINDINGS Anterior circulation: Atheromatous change within the carotid siphons with associated mild to moderate narrowing bilaterally. A1 segments, anterior communicating complex common anterior cerebral arteries patent without stenosis. No M1 stenosis or occlusion. No proximal MCA branch occlusion or high-grade stenosis. Visualized distal MCA branches perfused and symmetric. Posterior circulation: Both V4 segments widely patent. Right PICA patent at its origin. Left PICA not well seen. Basilar patent without stenosis. Superior cerebellar and posterior cerebral arteries patent bilaterally. Venous sinuses: Visualized dural sinuses patent allowing for timing the contrast bolus. Anatomic variants: None significant.  No aneurysm. Review of the MIP images confirms the above findings CT Brain Perfusion Findings: ASPECTS: 10 CBF (<30%) Volume: 0mL Perfusion (Tmax>6.0s) volume: 0mL Mismatch Volume: 0mL Infarction Location:Negative CT perfusion with no evidence for acute ischemia or other perfusion abnormality. IMPRESSION: 1. Negative CTA for large vessel occlusion or other emergent finding. 2. Negative CT perfusion with no evidence for acute ischemia or other perfusion abnormality. 3. Atheromatous change within the carotid siphons with associated mild to moderate narrowing bilaterally. Aortic Atherosclerosis (ICD10-I70.0). Electronically Signed   By: Rise Mu M.D.   On:  03/13/2023 22:26   DG Chest Port 1 View  Result Date: 03/13/2023 CLINICAL DATA:  Weakness. EXAM: PORTABLE CHEST 1 VIEW COMPARISON:  Radiograph 12/16/2022, CT 12/17/2022 FINDINGS: Previous left-sided dialysis catheter is been removed. Cardiomegaly stable. Unchanged mediastinal contours. Streaky retrocardiac opacity likely represents scarring. There may be small pleural effusions. No pulmonary edema or confluent consolidation. No pneumothorax. On limited assessment, no  acute osseous findings. IMPRESSION: 1. Stable cardiomegaly. Possible small pleural effusions. 2. Streaky retrocardiac opacity likely represents scarring. Electronically Signed   By: Narda Rutherford M.D.   On: 03/13/2023 21:49   CT HEAD CODE STROKE WO CONTRAST  Result Date: 03/13/2023 CLINICAL DATA:  Code stroke. Initial evaluation for neuro deficit, stroke. Right-sided weakness. EXAM: CT HEAD WITHOUT CONTRAST TECHNIQUE: Contiguous axial images were obtained from the base of the skull through the vertex without intravenous contrast. RADIATION DOSE REDUCTION: This exam was performed according to the departmental dose-optimization program which includes automated exposure control, adjustment of the mA and/or kV according to patient size and/or use of iterative reconstruction technique. COMPARISON:  Prior CT from 12/31/2017. FINDINGS: Brain: Age-related cerebral atrophy with moderate chronic microvascular ischemic disease. No acute intracranial hemorrhage. No acute large vessel territory infarct. No mass lesion, midline shift or mass effect. No hydrocephalus or extra-axial fluid collection. Vascular: No abnormal hyperdense vessel. Scattered calcified atherosclerosis about the skull base. Skull: Small lipoma at the right frontal scalp. No acute finding. Calvarium intact. Sinuses/Orbits: Globes and orbital soft tissues within normal limits. Small retention cysts within the right sphenoid sinus. Paranasal sinuses are otherwise clear. No mastoid  effusion. Other: None. ASPECTS [facility] Stroke Program Early CT Score) - Ganglionic level infarction (caudate, lentiform nuclei, internal capsule, insula, M1-M3 cortex): 7 - Supraganglionic infarction (M4-M6 cortex): 3 Total score (0-10 with 10 being normal): 10 IMPRESSION: 1. No acute intracranial abnormality. 2. Age-related cerebral atrophy with moderate chronic microvascular ischemic disease. 3. ASPECTS is 10. Results were called by telephone at the time of interpretation on 03/13/2023 at 9:08 pm to provider [facility] ZAMMIT , who verbally acknowledged these results. Electronically Signed   By: Rise Mu M.D.   On: 03/13/2023 21:10    EKG: My personal interpretation of EKG shows: Sinus rhythm heart rate 72.  Right atrial enlargement.  No evidence of ST anterior wave abnormality.     Assessment/Plan: Principal Problem:   Acute metabolic encephalopathy Active Problems:   Elevated troponin   Combined systolic and diastolic heart failure (HCC)   Essential hypertension   ESRD (end stage renal disease) on dialysis TTS schedule (HCC)   Anemia of chronic disease   Peripheral neuropathy   Cocaine use   History of DVT (deep vein thrombosis)   History of CVA (cerebrovascular accident)    Assessment and Plan: Acute metabolic encephalopathy Altered mentation-improving -Acute metabolic encephalopathy in the setting of cocaine use. - Patient coming with complaining of right-sided lower extremity weakness, drowsiness altered mentation.  Brought up by patient's wife to the ED. - Initial concern for stroke and neurology has been consulted recommended CT head, MRI of the brain and CTA head and neck. -CT head no acute intracranial abnormality.  Age-related cerebral atrophy with moderate chronic microvascular ischemic change. -MRI no acute intracranial abnormality and moderate advanced chronic microvascular ischemic disease with small remote infarction involving the right basal ganglia, left  thalamus and right cerebrum. -CT angiogram negative for any large vessel occlusion and no other emergent finding.  Negative perfusion for acute ischemia.Atheromatous change within the carotid siphons with associated mild to moderate narrowing bilaterally. -Given we have ruled out stroke believe that acute metabolic encephalopathy (altered mentation mostly drowsiness) in the setting of cocaine use. - Patient is currently alert oriented x 2. -Pending bedside swallow evaluation before starting oral diet and oral medications. - Continue fall precaution -Neurocheck every 4 hours   History of CVA -Per chart review patient is not any lipid-lowering agent  except aspirin 81 mg daily and Eliquis 5 mg twice daily for treatment of DVT - Continue aspirin 81 mg daily - Checking A1c and lipid panel -Starting low-dose Lipitor 10 mg daily.  Elevated troponin -High sensitive troponin 39 and 40.  EKG showed normal sinus rhythm, heart rate 72, right atrial enlargement and no ST and T wave abnormality.  Patient denies chest pain. - Elevated troponin secondary from demand ischemia in the setting of cocaine use. 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 -Per chart review previous echocardiogram 12/2022 EF 30 to 35% and grade 2 diastolic dysfunction, global left ventricular hypokinesis. -At home patient is on amlodipine 10 mg daily, Imdur 30 mg daily hydralazine 25 mg twice daily except on dialysis day he takes 1 mg daily and labetalol 100 mg daily. -Resumed amlodipine, Imdur and hydralazine however will give it after the dialysis session. -Holding labetalol in the setting of cocaine use.  ESRD on HD TTS schedule -Patient is compliant with dialysis.   Last dialysis on 03/12/2023. -Consulted nephrology Dr. Estill Bakes for dialysis. - Please hold oral blood pressure regimen before dialysis session.  Anemia chronic disease -Stable H&H 10.3 and 32.  Continue to monitor  Peripheral neuropathy -At home patient is on gabapentin 300 mg twice daily.  As patient is already drowsy holding gabapentin.  Cocaine use -Patient reported using crack cocaine. -Checking serum drug screen. - Counseled patient at bedside for cessation of cocaine use, patient verbally understand and agrees for now.  History of DVT -Continue home Eliquis 5 mg twice daily.  DVT prophylaxis:  Eliquis Code Status:  Full Code Diet: Renal diet. Family Communication: Discussed treatment plan both with patient and patient's wife at the bedside. Disposition Plan: Plan to discharge home in next 2 to 3 days. Consults: Nephrology and transition care team Admission status:   Inpatient, Telemetry bed  Severity of Illness: The appropriate patient status for this patient is INPATIENT. Inpatient status is judged to be reasonable and necessary in order to provide the required intensity of service to ensure the patient's safety. The patient's presenting symptoms, physical exam findings, and initial radiographic and laboratory data in the context of their chronic comorbidities is felt to place them at high risk for further clinical deterioration. Furthermore, it is not anticipated that the patient will be medically stable for discharge from the hospital within 2 midnights of admission.   * I certify that at the point of admission it is my clinical judgment that the patient will require inpatient hospital care spanning beyond 2 midnights from the point of admission due to high intensity of service, high risk for further deterioration and high frequency of surveillance required.Marland Kitchen    Tereasa Coop, MD [facility]  How to contact the [facility] Attending or Consulting provider 7A - 7P or  covering provider during after hours 7P -7A, for this patient.  Check the care team in [facility] and look for a) attending/consulting TR[facility] provider listed and b) the [facility] team listed Log into [email or web address] and use [facility]'s universal password to access. If you do not have the password, please contact the hospital operator. Locate the [facility] provider you are looking for under [facility] and page to a number that you can be directly reached. If you still  have difficulty reaching the provider, please page the [facility] (Director on Call) for the Hospitalists listed on amion for assistance.  03/14/2023, 4:12 AM

## 2023-03-14 NOTE — ED Notes (Signed)
[facility] transport arranged at this time

## 2023-03-14 NOTE — Discharge Summary (Signed)
Name: Ronald Reeves MRN: [id number] DOB: [birth year] 67 y.o. PCP: Benjiman Core, MD  Date of Admission: 03/13/2023  7:56 PM Date of Discharge: 8/3/202408/10/2022 Attending Physician: No att. providers found   Patient was discharged Against Medical Advice  Discharge Diagnosis: 1.  Cocaine induced encephalopathy  Discharge Medications: Allergies as of 03/14/2023   No Known Allergies      Medication List     TAKE these medications    albuterol 108 (90 Base) MCG/ACT inhaler Commonly known as: VENTOLIN HFA Inhale 2 puffs into the lungs every 6 (six) hours as needed for wheezing or shortness of breath.   amLODipine 10 MG tablet Commonly known as: NORVASC Take 1 tablet (10 mg total) by mouth daily.   apixaban 5 MG Tabs tablet Commonly known as: ELIQUIS Take 1 tablet (5 mg total) by mouth 2 (two) times daily.   aspirin EC 81 MG tablet Take 81 mg by mouth daily.   Blood Pressure Kit Please check blood pressure every day, and keep a log.   furosemide 40 MG tablet Commonly known as: LASIX Take 40 mg by mouth 2 (two) times daily.   gabapentin 300 MG capsule Commonly known as: NEURONTIN Take 300 mg by mouth 2 (two) times daily.   hydrALAZINE 25 MG tablet Commonly known as: APRESOLINE Take 1 tablet (25 mg total) by mouth daily. What changed:  when to take this additional instructions   isosorbide mononitrate 30 MG 24 hr tablet Commonly known as: IMDUR Take 1 tablet (30 mg total) by mouth daily.   labetalol 100 MG tablet Commonly known as: NORMODYNE Take 2 tablets (200 mg total) by mouth every morning. What changed:  how much to take when to take this additional instructions   labetalol 100 MG tablet Commonly known as: NORMODYNE Take 100 mg by mouth 2 (two) times daily. What changed: Another medication with the same name was changed. Make sure you understand how and when to take each.   lidocaine 5 % Commonly known as: LIDODERM Place 1 patch onto the  skin daily as needed (pain). Remove & Discard patch within 12 hours or as directed by MD   multivitamin Tabs tablet Take 1 tablet by mouth daily.   naloxone 4 MG/0.1ML Liqd nasal spray kit Commonly known as: NARCAN Place 0.4 mg into the nose See admin instructions.  SPRAY 1 SPRAY INTO ONE NOSTRIL AS DIRECTED AS NEEDED FOR RESCUE FOR OPIOID OVERDOSE - CALL 911 IMMEDIATELY, ADMINISTER DOSE, THEN TURN PERSON ON SIDE - IF NO RESPONSE IN 2-3 MINUTES OR PERSON RESPONDS BUT RELAPSES, REPEAT USING A NEW SPRAY DEVICE AND SPRAY INTO THE OTHER NOSTRIL AS NEEDED FOR RESCUE   omeprazole 40 MG capsule Commonly known as: PRILOSEC Take 1 capsule (40 mg total) by mouth daily. What changed:  when to take this reasons to take this   sevelamer carbonate 800 MG tablet Commonly known as: RENVELA Take 1,600 mg by mouth 3 (three) times daily with meals.   sorbitol 70 % solution Take 10 mLs by mouth 3 (three) times daily as needed (constipation).   tamsulosin 0.4 MG Caps capsule Commonly known as: FLOMAX Take 0.4 mg by mouth 2 (two) times daily.   Voltaren 1 % Gel Generic drug: diclofenac Sodium Apply 1 Application topically 2 (two) times daily as needed (pain).        Disposition and follow-up:   Ronald Reeves was discharged from [facility] in Stable condition.  At the hospital follow up visit please address:  1.  Cocaine use: Patient had used cocaine prior to admission which likely causes encephalopathy.  We were unable to obtain further workup due to patient leaving AMA.  2.  Labs / imaging needed at time of follow-up: CBC, BMP  3.  Pending labs/ test needing follow-up: None  Follow-up Appointments: Unable to schedule any as patient left [facility] Course by problem list:  67 year old male who presented to the emergency department after using some cocaine, found to be encephalopathic.  #Acute toxic encephalopathy Patient was worked up for code stroke, which was  negative for stroke.  Patient admitted to using cocaine, and wife at bedside who said he used cocaine as well.  Patient does occasionally use cocaine, and patient did use more than he usually should.  Patient had imaging which was negative for stroke.  Encephalopathy was thought to be secondary to cocaine use.  During hospital stay, patient did improve somewhat, but did not allow for full workup such as ruling out infectious etiology.  Patient said to leave AMA, and full workup was not achieved.  #History of CVA Patient does have a history of CVA.  Admitting physician did start patient on Lipitor 10 mg daily.  Unable to give patient Lipitor on discharge as patient left AMA.  While inpatient patient continued Eliquis and aspirin.  #Combined systolic and diastolic heart failure No acute concern for exacerbation during hospitalization.  Patient did have softer blood pressure during hospitalization, and labetalol was held.  Patient continued on hydralazine, Imdur, and amlodipine.  Unable to fully restart patient on all medications as patient left AMA.  #ESRD on dialysis During hospitalization, dialysis was scheduled, but patient decided to leave prior to getting dialysis as he wanted to go to his dialysis appointment at 10 AM.  Patient never received dialysis during hospitalization as patient left AMA.  #History of DVT No acute concerns during hospitalization.  Patient resumed home Eliquis 5 mg twice daily during hospitalization.  #Peripheral neuropathy Given initial somnolence, patient was held on gabapentin during hospitalization, and unable to be resumed given patient left AMA.  Subjective:  Patient evaluated bedside.  He was very somnolent initially, but after reevaluation, patient was up and was getting dressed.  He was adamant about leaving AGAINST MEDICAL ADVICE.  Patient understood the risks and accepted these risks.  Wife at bedside to take patient home.  Discharge Exam:   BP (!) 135/56 (BP  Location: Left Arm)   Pulse 67   Temp 97.8 F (36.6 C) (Oral)   Resp 16   Ht 5\' 10"  (1.778 m)   Wt 65.2 kg   SpO2 99%   BMI 20.62 kg/m  Discharge exam:  General: Patient is resting comfortably in bed in no acute distress Eyes: Pupils equal and reactive to light, EOM intact  Head: Normocephalic, atraumatic  Cardio: Regular rate and rhythm, no murmurs, rubs or gallops Pulmonary: Clear to ausculation bilaterally with no rales, rhonchi, and crackles  Abdomen: Soft, nontender with normoactive bowel sounds with no rebound or guarding  Neuro: Alert and orientated x3. CN II-XII intact. Sensation intact to upper and lower extremities. Gait unsteady  MSK: 5/5 strength to upper and lower extremities.   Pertinent Labs, Studies, and Procedures:  Troponin flat at 39, A1c 5.8, total cholesterol 141, triglycerides 48, HDL 61, LDL 70. CBC showing white count 6.2, hemoglobin 10.1, platelets 171.  CMP showing sodium 136, potassium 4.9, creatinine 8.34, albumin 3.1, AST 18, ALT 13  Discharge Instructions: None given as patient  signed out AMA.  SignedModena Slater, DO 03/14/2023, 1:54 PM   Pager: [phone]

## 2023-03-14 NOTE — ED Notes (Addendum)
Attempted to pull patient's eliquis medication from the pyxis. Unable to override or pull medication under scheduled doses. [facility] arrived at this time and transported pt to [facility].

## 2023-03-18 ENCOUNTER — Observation Stay (HOSPITAL_COMMUNITY)
Admission: EM | Admit: 2023-03-18 | Discharge: 2023-03-19 | Disposition: A | Discharge status: Home / Self Care | Payer: No Typology Code available for payment source | Attending: Internal Medicine | Admitting: Internal Medicine

## 2023-03-18 ENCOUNTER — Emergency Department (HOSPITAL_COMMUNITY): Payer: No Typology Code available for payment source

## 2023-03-18 DIAGNOSIS — G9340 Encephalopathy, unspecified: Secondary | ICD-10-CM | POA: Diagnosis present

## 2023-03-18 DIAGNOSIS — T426X1A Poisoning by other antiepileptic and sedative-hypnotic drugs, accidental (unintentional), initial encounter: Secondary | ICD-10-CM

## 2023-03-18 DIAGNOSIS — Z7901 Long term (current) use of anticoagulants: Secondary | ICD-10-CM | POA: Insufficient documentation

## 2023-03-18 DIAGNOSIS — I132 Hypertensive heart and chronic kidney disease with heart failure and with stage 5 chronic kidney disease, or end stage renal disease: Secondary | ICD-10-CM | POA: Diagnosis not present

## 2023-03-18 DIAGNOSIS — I5032 Chronic diastolic (congestive) heart failure: Secondary | ICD-10-CM | POA: Diagnosis not present

## 2023-03-18 DIAGNOSIS — Z992 Dependence on renal dialysis: Secondary | ICD-10-CM | POA: Diagnosis not present

## 2023-03-18 DIAGNOSIS — N186 End stage renal disease: Secondary | ICD-10-CM | POA: Diagnosis not present

## 2023-03-18 DIAGNOSIS — Z86718 Personal history of other venous thrombosis and embolism: Secondary | ICD-10-CM | POA: Insufficient documentation

## 2023-03-18 DIAGNOSIS — I10 Essential (primary) hypertension: Secondary | ICD-10-CM

## 2023-03-18 DIAGNOSIS — Z79899 Other long term (current) drug therapy: Secondary | ICD-10-CM | POA: Diagnosis not present

## 2023-03-18 DIAGNOSIS — R400 Somnolence: Secondary | ICD-10-CM | POA: Diagnosis not present

## 2023-03-18 DIAGNOSIS — G9341 Metabolic encephalopathy: Secondary | ICD-10-CM | POA: Diagnosis not present

## 2023-03-18 DIAGNOSIS — J449 Chronic obstructive pulmonary disease, unspecified: Secondary | ICD-10-CM | POA: Insufficient documentation

## 2023-03-18 DIAGNOSIS — I2510 Atherosclerotic heart disease of native coronary artery without angina pectoris: Secondary | ICD-10-CM | POA: Insufficient documentation

## 2023-03-18 DIAGNOSIS — Z8673 Personal history of transient ischemic attack (TIA), and cerebral infarction without residual deficits: Secondary | ICD-10-CM | POA: Insufficient documentation

## 2023-03-18 DIAGNOSIS — R531 Weakness: Secondary | ICD-10-CM | POA: Diagnosis present

## 2023-03-18 LAB — PROTIME-INR
INR: 1.3 — ABNORMAL HIGH (ref 0.8–1.2)
Prothrombin Time: 15.9 seconds — ABNORMAL HIGH (ref 11.4–15.2)

## 2023-03-18 LAB — CBC
HCT: 32.7 % — ABNORMAL LOW (ref 39.0–52.0)
Hemoglobin: 10.4 g/dL — ABNORMAL LOW (ref 13.0–17.0)
MCH: 28.3 pg (ref 26.0–34.0)
MCHC: 31.8 g/dL (ref 30.0–36.0)
MCV: 88.9 fL (ref 80.0–100.0)
Platelets: 171 10*3/uL (ref 150–400)
RBC: 3.68 MIL/uL — ABNORMAL LOW (ref 4.22–5.81)
RDW: 15.8 % — ABNORMAL HIGH (ref 11.5–15.5)
WBC: 5.7 10*3/uL (ref 4.0–10.5)
nRBC: 0 % (ref 0.0–0.2)

## 2023-03-18 LAB — COMPREHENSIVE METABOLIC PANEL
ALT: 13 U/L (ref 0–44)
AST: 18 U/L (ref 15–41)
Albumin: 3.5 g/dL (ref 3.5–5.0)
Alkaline Phosphatase: 62 U/L (ref 38–126)
Anion gap: 15 (ref 5–15)
BUN: 45 mg/dL — ABNORMAL HIGH (ref 8–23)
CO2: 22 mmol/L (ref 22–32)
Calcium: 10.5 mg/dL — ABNORMAL HIGH (ref 8.9–10.3)
Chloride: 101 mmol/L (ref 98–111)
Creatinine, Ser: 8.14 mg/dL — ABNORMAL HIGH (ref 0.61–1.24)
GFR, Estimated: 7 mL/min — ABNORMAL LOW (ref >60)
Glucose, Bld: 127 mg/dL — ABNORMAL HIGH (ref 70–99)
Potassium: 4.9 mmol/L (ref 3.5–5.1)
Sodium: 138 mmol/L (ref 135–145)
Total Bilirubin: 0.5 mg/dL (ref 0.3–1.2)
Total Protein: 7.9 g/dL (ref 6.5–8.1)

## 2023-03-18 LAB — DIFFERENTIAL
Abs Immature Granulocytes: 0.01 10*3/uL (ref 0.00–0.07)
Basophils Absolute: 0 10*3/uL (ref 0.0–0.1)
Basophils Relative: 1 %
Eosinophils Absolute: 0.3 10*3/uL (ref 0.0–0.5)
Eosinophils Relative: 5 %
Immature Granulocytes: 0 %
Lymphocytes Relative: 24 %
Lymphs Abs: 1.3 10*3/uL (ref 0.7–4.0)
Monocytes Absolute: 0.7 10*3/uL (ref 0.1–1.0)
Monocytes Relative: 12 %
Neutro Abs: 3.4 10*3/uL (ref 1.7–7.7)
Neutrophils Relative %: 58 %

## 2023-03-18 LAB — I-STAT CHEM 8, ED
BUN: 42 mg/dL — ABNORMAL HIGH (ref 8–23)
Calcium, Ion: 1.24 mmol/L (ref 1.15–1.40)
Chloride: 105 mmol/L (ref 98–111)
Creatinine, Ser: 9.4 mg/dL — ABNORMAL HIGH (ref 0.61–1.24)
Glucose, Bld: 124 mg/dL — ABNORMAL HIGH (ref 70–99)
HCT: 33 % — ABNORMAL LOW (ref 39.0–52.0)
Hemoglobin: 11.2 g/dL — ABNORMAL LOW (ref 13.0–17.0)
Potassium: 5 mmol/L (ref 3.5–5.1)
Sodium: 139 mmol/L (ref 135–145)
TCO2: 24 mmol/L (ref 22–32)

## 2023-03-18 LAB — APTT: aPTT: 33 seconds (ref 24–36)

## 2023-03-18 LAB — ETHANOL: Alcohol, Ethyl (B): <10 mg/dL (ref <10)

## 2023-03-18 NOTE — ED Provider Notes (Signed)
[place] EMERGENCY DEPARTMENT AT [facility] Provider Note   CSN: [id number] Arrival date & time: 03/18/23  2229     History {Add pertinent medical, surgical, social history, OB history to HPI:1} Chief Complaint  Patient presents with   Weakness    Ronald Reeves is a 67 y.o. male.  The history is provided by the patient, the spouse and medical records.  Weakness Ronald Reeves is a 67 y.o. male who presents to the Emergency Department complaining of *** BUE shaking, difficulty standing, walking, speaking Dialyzed Tuesday no issue.       Home Medications Prior to Admission medications   Medication Sig Start Date End Date Taking? Authorizing Provider  albuterol (VENTOLIN HFA) 108 (90 Base) MCG/ACT inhaler Inhale 2 puffs into the lungs every 6 (six) hours as needed for wheezing or shortness of breath.    [provider]  amLODipine (NORVASC) 10 MG tablet Take 1 tablet (10 mg total) by mouth daily. 01/01/18   Lewayne Bunting, MD  apixaban (ELIQUIS) 5 MG TABS tablet Take 1 tablet (5 mg total) by mouth 2 (two) times daily. 03/06/23   Atway, Derwood Kaplan, DO  aspirin EC 81 MG tablet Take 81 mg by mouth daily.    [provider]  Blood Pressure KIT Please check blood pressure every day, and keep a log. 12/07/22   Arrien, York Ram, MD  diclofenac Sodium (VOLTAREN) 1 % GEL Apply 1 Application topically 2 (two) times daily as needed (pain).    [provider]  furosemide (LASIX) 40 MG tablet Take 40 mg by mouth 2 (two) times daily.    [provider]  gabapentin (NEURONTIN) 300 MG capsule Take 300 mg by mouth 2 (two) times daily. 12/31/22   [provider]  hydrALAZINE (APRESOLINE) 25 MG tablet Take 1 tablet (25 mg total) by mouth daily. Patient taking differently: Take 25 mg by mouth See admin instructions. 25 mg twice daily, except 1 tablet daily on dialysis days (taken after dialysis). 12/07/22   Arrien, York Ram, MD  isosorbide  mononitrate (IMDUR) 30 MG 24 hr tablet Take 1 tablet (30 mg total) by mouth daily. 01/01/18   Lewayne Bunting, MD  labetalol (NORMODYNE) 100 MG tablet Take 2 tablets (200 mg total) by mouth every morning. Patient taking differently: Take 100 mg by mouth See admin instructions. 100 mg twice daily, except 1 tablet daily on dialysis days (taken after dialysis). 01/01/18   Lewayne Bunting, MD  labetalol (NORMODYNE) 100 MG tablet Take 100 mg by mouth 2 (two) times daily. 01/21/23   [provider]  lidocaine (LIDODERM) 5 % Place 1 patch onto the skin daily as needed (pain). Remove & Discard patch within 12 hours or as directed by MD    [provider]  multivitamin (RENA-VIT) TABS tablet Take 1 tablet by mouth daily.    [provider]  naloxone [facility]) nasal spray 4 mg/0.1 mL Place 0.4 mg into the nose See admin instructions.  SPRAY 1 SPRAY INTO ONE NOSTRIL AS DIRECTED AS NEEDED FOR RESCUE FOR OPIOID OVERDOSE - CALL 911 IMMEDIATELY, ADMINISTER DOSE, THEN TURN PERSON ON SIDE - IF NO RESPONSE IN 2-3 MINUTES OR PERSON RESPONDS BUT RELAPSES, REPEAT USING A NEW SPRAY DEVICE AND SPRAY INTO THE OTHER NOSTRIL AS NEEDED FOR RESCUE 01/21/23   [provider]  omeprazole (PRILOSEC) 40 MG capsule Take 1 capsule (40 mg total) by mouth daily. Patient taking differently: Take 40 mg by mouth 2 (two)  times daily as needed. 06/06/22   Arnaldo Natal, NP  sevelamer carbonate (RENVELA) 800 MG tablet Take 1,600 mg by mouth 3 (three) times daily with meals. 01/06/23   [provider]  sorbitol 70 % solution Take 10 mLs by mouth 3 (three) times daily as needed (constipation). 01/20/23   [provider]  tamsulosin (FLOMAX) 0.4 MG CAPS capsule Take 0.4 mg by mouth 2 (two) times daily.    [provider]      Allergies    Patient has no known allergies.    Review of Systems   Review of Systems  Neurological:  Positive for weakness.    Physical  Exam Updated Vital Signs BP (!) 120/41   Pulse 73   Temp 97.6 F (36.4 C) (Oral)   Resp 16   SpO2 96%  Physical Exam  ED Results / Procedures / Treatments   Labs (all labs ordered are listed, but only abnormal results are displayed) Labs Reviewed  CBC - Abnormal; Notable for the following components:      Result Value   RBC 3.68 (*)    Hemoglobin 10.4 (*)    HCT 32.7 (*)    RDW 15.8 (*)    All other components within normal limits  I-STAT CHEM 8, ED - Abnormal; Notable for the following components:   BUN 42 (*)    Creatinine, Ser 9.40 (*)    Glucose, Bld 124 (*)    Hemoglobin 11.2 (*)    HCT 33.0 (*)    All other components within normal limits  DIFFERENTIAL  PROTIME-INR  APTT  COMPREHENSIVE METABOLIC PANEL  ETHANOL  RAPID URINE DRUG SCREEN, HOSP PERFORMED    EKG None  Radiology CT HEAD WO CONTRAST  Result Date: 03/18/2023 CLINICAL DATA:  Tremor and weakness EXAM: CT HEAD WITHOUT CONTRAST TECHNIQUE: Contiguous axial images were obtained from the base of the skull through the vertex without intravenous contrast. RADIATION DOSE REDUCTION: This exam was performed according to the departmental dose-optimization program which includes automated exposure control, adjustment of the mA and/or kV according to patient size and/or use of iterative reconstruction technique. COMPARISON:  None Available. FINDINGS: Brain: There is no mass, hemorrhage or extra-axial collection. The size and configuration of the ventricles and extra-axial CSF spaces are normal. There is hypoattenuation of the white matter, most commonly indicating chronic small vessel disease. Vascular: No abnormal hyperdensity of the major intracranial arteries or dural venous sinuses. No intracranial atherosclerosis. Skull: The visualized skull base, calvarium and extracranial soft tissues are normal. Sinuses/Orbits: No fluid levels or advanced mucosal thickening of the visualized paranasal sinuses. No mastoid or middle ear  effusion. The orbits are normal. IMPRESSION: 1. No acute intracranial abnormality. 2. Chronic small vessel disease. Electronically Signed   By: Deatra Robinson M.D.   On: 03/18/2023 23:17    Procedures Procedures  {Document cardiac monitor, telemetry assessment procedure when appropriate:1}  Medications Ordered in ED Medications - No data to display  ED Course/ Medical Decision Making/ A&P   {   Click here for ABCD2, HEART and other calculatorsREFRESH Note before signing :1}                              Medical Decision Making  ***  {Document critical care time when appropriate:1} {Document review of labs and clinical decision tools ie heart score, Chads2Vasc2 etc:1}  {Document your independent review of radiology images, and any outside records:1} {Document  your discussion with family members, caretakers, and with consultants:1} {Document social determinants of health affecting pt's care:1} {Document your decision making why or why not admission, treatments were needed:1} Final Clinical Impression(s) / ED Diagnoses Final diagnoses:  None    Rx / DC Orders ED Discharge Orders     None

## 2023-03-18 NOTE — ED Triage Notes (Signed)
Pt with tremors to bilateral arms.  Reports unable to grip objects and dropping things.  Pt states these symptoms have been going on a while, since he was discharged from the hospital almost a week ago.

## 2023-03-19 DIAGNOSIS — F1490 Cocaine use, unspecified, uncomplicated: Secondary | ICD-10-CM | POA: Diagnosis not present

## 2023-03-19 DIAGNOSIS — G9340 Encephalopathy, unspecified: Secondary | ICD-10-CM | POA: Diagnosis not present

## 2023-03-19 DIAGNOSIS — T426X1A Poisoning by other antiepileptic and sedative-hypnotic drugs, accidental (unintentional), initial encounter: Secondary | ICD-10-CM

## 2023-03-19 DIAGNOSIS — G253 Myoclonus: Secondary | ICD-10-CM | POA: Diagnosis not present

## 2023-03-19 DIAGNOSIS — N186 End stage renal disease: Secondary | ICD-10-CM

## 2023-03-19 LAB — I-STAT CHEM 8, ED
BUN: 47 mg/dL — ABNORMAL HIGH (ref 8–23)
Calcium, Ion: 1.23 mmol/L (ref 1.15–1.40)
Chloride: 105 mmol/L (ref 98–111)
Creatinine, Ser: 9.3 mg/dL — ABNORMAL HIGH (ref 0.61–1.24)
Glucose, Bld: 118 mg/dL — ABNORMAL HIGH (ref 70–99)
HCT: 32 % — ABNORMAL LOW (ref 39.0–52.0)
Hemoglobin: 10.9 g/dL — ABNORMAL LOW (ref 13.0–17.0)
Potassium: 5 mmol/L (ref 3.5–5.1)
Sodium: 139 mmol/L (ref 135–145)
TCO2: 25 mmol/L (ref 22–32)

## 2023-03-19 LAB — TROPONIN I (HIGH SENSITIVITY): Troponin I (High Sensitivity): 45 ng/L — ABNORMAL HIGH (ref <18)

## 2023-03-19 MED ORDER — CHLORHEXIDINE GLUCONATE CLOTH 2 % EX PADS: 6.0000 | MEDICATED_PAD | Freq: Every day | CUTANEOUS | Status: DC

## 2023-03-19 MED ORDER — APIXABAN 5 MG PO TABS
5.0000 mg | ORAL_TABLET | Freq: Two times a day (BID) | ORAL | Status: DC
  Administered 2023-03-19: 5 mg via ORAL
  Filled 2023-03-19: qty 1

## 2023-03-19 MED ORDER — IPRATROPIUM-ALBUTEROL 0.5-2.5 (3) MG/3ML IN SOLN: 3.0000 mL | Freq: Four times a day (QID) | RESPIRATORY_TRACT | Status: DC | PRN

## 2023-03-19 MED ORDER — SEVELAMER CARBONATE 800 MG PO TABS
1600.0000 mg | ORAL_TABLET | Freq: Three times a day (TID) | ORAL | Status: DC
  Administered 2023-03-19 (×2): 1600 mg via ORAL
  Filled 2023-03-19 (×2): qty 2

## 2023-03-19 MED ORDER — AMLODIPINE BESYLATE 10 MG PO TABS: 10.0000 mg | ORAL_TABLET | Freq: Every day | ORAL | Status: DC

## 2023-03-19 MED ORDER — TAMSULOSIN HCL 0.4 MG PO CAPS
0.4000 mg | ORAL_CAPSULE | Freq: Two times a day (BID) | ORAL | Status: DC
  Administered 2023-03-19: 0.4 mg via ORAL
  Filled 2023-03-19: qty 1

## 2023-03-19 MED ORDER — RENA-VITE PO TABS
1.0000 | ORAL_TABLET | Freq: Every day | ORAL | Status: DC
  Filled 2023-03-19: qty 1

## 2023-03-19 MED ORDER — PANTOPRAZOLE SODIUM 40 MG PO TBEC
40.0000 mg | DELAYED_RELEASE_TABLET | Freq: Every day | ORAL | Status: DC
  Administered 2023-03-19: 40 mg via ORAL
  Filled 2023-03-19: qty 1

## 2023-03-19 MED ORDER — DICLOFENAC SODIUM 1 % EX GEL
4.0000 g | Freq: Four times a day (QID) | CUTANEOUS | Status: DC
  Administered 2023-03-19: 4 g via TOPICAL
  Filled 2023-03-19: qty 100

## 2023-03-19 MED ORDER — ACETAMINOPHEN 325 MG PO TABS: 650.0000 mg | ORAL_TABLET | Freq: Four times a day (QID) | ORAL | Status: DC | PRN

## 2023-03-19 MED ORDER — HEPARIN SODIUM (PORCINE) 1000 UNIT/ML IJ SOLN
INTRAMUSCULAR | Status: AC
  Filled 2023-03-19: qty 3

## 2023-03-19 MED ORDER — LIDOCAINE 5 % EX PTCH: 1.0000 | MEDICATED_PATCH | Freq: Every day | CUTANEOUS | Status: DC | PRN

## 2023-03-19 MED ORDER — HEPARIN SODIUM (PORCINE) 5000 UNIT/ML IJ SOLN: 5000.0000 [IU] | Freq: Three times a day (TID) | INTRAMUSCULAR | Status: DC

## 2023-03-19 NOTE — Progress Notes (Signed)
   03/19/23 1620  Vitals  Temp 98 F (36.7 C)  Pulse Rate 79  Resp 16  BP (!) 140/57  SpO2 100 %  O2 Device Room Air  Weight  (unable to obtain)  Type of Weight Post-Dialysis  During Treatment Monitoring  HD Safety Checks Performed Yes  Intra-Hemodialysis Comments Tx completed;Tolerated well   Received patient in bed to unit.  Alert and oriented.  Informed consent signed and in chart.   TX duration:3hrs  Patient tolerated well.  Transported back to the room  Alert, without acute distress.  Hand-off given to patient's nurse.   Access used: R thigh DC Access issues: none  Total UF removed: 2.5L Medication(s) given: none   Na'Shaminy T  [facility]

## 2023-03-19 NOTE — Procedures (Signed)
I was present at this dialysis session, have reviewed the session and made  appropriate changes Vinson Moselle MD  [facility] 03/19/2023, 2:12 PM

## 2023-03-19 NOTE — Progress Notes (Addendum)
Neurology Progress Note  Brief HPI:  67 y.o. male with a PMHx of ESRD on HD, anemia, arthritis, COPD, CAD, HTN, pre-diabetes and substance abuse who presented to the ED with his wife on Wednesday night for evaluation of somnolence and tremors and irregular jerky movements of his limbs. These symptoms have been ongoing for about one week (he was admitted to the hospital at that time but left AMA). His only new medication in the last month was gabapentin 300 mg BID. He also admits to using cocaine twice daily, but this is not new for the patient.  Subjective: The patient was seen with his wife at the bedside. He is very somnolent and his eyes are closed for most of the encounter. He is able to follow commands but requires frequent redirection. Passed swallow screen.   Exam: Vitals:   03/19/23 0419 03/19/23 0604  BP:  (!) 150/62  Pulse:  67  Resp:  10  Temp: (!) 97.5 F (36.4 C)   SpO2:  100%   Gen: In bed, NAD Resp: non-labored breathing, no acute distress Abd: soft, nt  Neuro: Mental Status: Somnolent. Eyes closed for most of exam but he will open eyes when asked repeatedly. Oriented to self, place, and month, but not to year or situation. Hypophonic and barely intelligible answers.  Cranial Nerves: PERRL. Hearing intact, voice is hypophonic. Unable to fully assess CN due to somnolence/cooperation Motor: Elevates all 4 extremities antigravity; +asterixis Sensory:Reacts to gross touch Gait: Deferred  Pertinent Labs: Phos 8.1 Mg 2.9 Trop 43 >45   Imaging Reviewed:  CT head: No acute intracranial abnormality. Chronic small vessel disease.   CT Cervical spine: IMPRESSION: 1. No acute fracture or static subluxation of the cervical spine. 2. C3 and C4 posterior decompression.  Assessment: 67 yo male with ESRD on HD TTS, anemia, arthritis,  COPD, CAD, HTN, pre-diabetes and substance abuse who presented to the ED with his wife on Wednesday night for evaluation of somnolence and  tremors and irregular jerky movements of his limbs. He reported being unable to grip objects and dropping things, as well as difficulty standing, walking and speaking, the latter three symptoms starting on Wednesday morning. In addition to being prescribed gabapentin 300 mg BID, the patient also uses cocaine twice daily, with last use on 8/7. He has asterixis on exam which could also be secondary to his gabapentin use.   Recommendations: 1) Gabapentin already discontinued 2) Please consult nephro to dialyze today (per usual schedule) 3) Please avoid opioids, especially oxycodone and morphine   Rayann Atway, DO Internal Medicine Resident, PGY-3  NEUROHOSPITALIST ADDENDUM Performed a face to face diagnostic evaluation.   I have reviewed the contents of history and physical exam as documented by PA/ARNP/Resident and agree with above documentation.  I have discussed and formulated the above plan as documented. Edits to the note have been made as needed.  Impression/Key exam findings/Plan: Asterixis in the setting of gabapentin. Reports slowly improving with discontinuation. Should improve with dialysis.  Erick Blinks, MD [facility] [phone]   If 7pm to 7am, please call on call as listed on AMION.

## 2023-03-19 NOTE — ED Notes (Signed)
ED TO INPATIENT HANDOFF REPORT  ED Nurse Name and Phone #: Theophilus Bones [phone]  S Name/Age/Gender Ronald Reeves 67 y.o. male Room/Bed: 038C/038C  Code Status   Code Status: Full Code  Home/SNF/Other Home Patient oriented to: self, place, time, and situation Is this baseline? Yes   Triage Complete: Triage complete  Chief Complaint Acute encephalopathy [G93.40]  Triage Note Pt with tremors to bilateral arms.  Reports unable to grip objects and dropping things.  Pt states these symptoms have been going on a while, since he was discharged from the hospital almost a week ago.     Allergies No Known Allergies  Level of Care/Admitting Diagnosis ED Disposition     ED Disposition  Admit   Condition  --   Comment  Hospital Area: [facility] [[id number]]  Level of Care: Telemetry Medical [104]  May place patient in observation at [facility] or [facility] if equivalent level of care is available:: Yes  Covid Evaluation: Asymptomatic - no recent exposure (last 10 days) testing not required  Diagnosis: Acute encephalopathy [[id number]]  Admitting Physician: Mercie Eon [[id number]]  Attending Physician: Mercie Eon [[id number]]          B Medical/Surgery History Past Medical History:  Diagnosis Date   Anemia    Arthritis    Back pain    Bronchitis    COPD (chronic obstructive pulmonary disease) (HCC)    Coronary artery disease    COVID    mild - flu like symptoms   Dyspnea    w/ exertion, uses inhaler   ESRD on hemodialysis (HCC)    dialysis on tues, thurs, sat at [facility]   GERD (gastroesophageal reflux disease)    not a current problem   Heart murmur    never has caused any problems   Hypertension    Myocardial infarction (HCC)    Pneumonia    x 1   Pre-diabetes    diet controlled, no meds, does not check blood sugar   Substance abuse (HCC)    Past Surgical History:  Procedure Laterality Date   AV FISTULA PLACEMENT Left 04/12/2021   Procedure: LEFT ARM  ARTERIOVENOUS (AV) FISTULA CREATION;  Surgeon: Cephus Shelling, MD;  Location: [facility] OR;  Service: Vascular;  Laterality: Left;   AV FISTULA PLACEMENT Left 10/18/2021   Procedure: LEFT ARM BRACHIOBASILIC FISTULA CREATION FIRST STAGE;  Surgeon: Cephus Shelling, MD;  Location: [facility] OR;  Service: Vascular;  Laterality: Left;   AV FISTULA PLACEMENT Right 11/28/2022   Procedure: RIGHT ARM FIRST STAGE BRACHIOBASILIC FISTULA CREATION;  Surgeon: Cephus Shelling, MD;  Location: [facility] OR;  Service: Vascular;  Laterality: Right;   BACK SURGERY     BASCILIC VEIN TRANSPOSITION Left 01/27/2022   Procedure: LEFT SECOND STAGE BASILIC VEIN TRANSPOSITION;  Surgeon: Cephus Shelling, MD;  Location: [facility] OR;  Service: Vascular;  Laterality: Left;   BASCILIC VEIN TRANSPOSITION Right 02/18/2023   Procedure: SECOND STAGE RIGHT ARM BASILIC VEIN TRANSPOSITION;  Surgeon: Cephus Shelling, MD;  Location: [facility] OR;  Service: Vascular;  Laterality: Right;   BIOPSY  08/20/2022   Procedure: BIOPSY;  Surgeon: Shellia Cleverly, DO;  Location: [facility] ENDOSCOPY;  Service: Gastroenterology;;   COLONOSCOPY WITH PROPOFOL N/A 08/20/2022   Procedure: COLONOSCOPY WITH PROPOFOL;  Surgeon: Shellia Cleverly, DO;  Location: [facility];  Service: Gastroenterology;  Laterality: N/A;   DIALYSIS/PERMA CATHETER INSERTION Right 03/05/2023   Procedure: DIALYSIS/PERMA CATHETER INSERTION;  Surgeon: Cephus Shelling, MD;  Location: [facility]  INVASIVE CV LAB;  Service: Cardiovascular;  Laterality: Right;   ESOPHAGOGASTRODUODENOSCOPY (EGD) WITH PROPOFOL N/A 08/20/2022   Procedure: ESOPHAGOGASTRODUODENOSCOPY (EGD) WITH PROPOFOL;  Surgeon: Shellia Cleverly, DO;  Location: [facility];  Service: Gastroenterology;  Laterality: N/A;   INSERTION OF DIALYSIS CATHETER Right 05/01/2022   Procedure: INSERTION OF DIALYSIS CATHETER;  Surgeon: [name], MD;  Location: [facility];  Service: Vascular;  Laterality: Right;   IR REMOVAL TUN CV CATH W/O FL   12/19/2022   LIGATION OF COMPETING BRANCHES OF ARTERIOVENOUS FISTULA Left 07/15/2021   Procedure: LIGATION OF COMPETING BRANCHES OF LEFT RADIOCEPHALIC ARTERIOVENOUS FISTULA TIMES TWO;  Surgeon: Cephus Shelling, MD;  [facility];  Service: Vascular;  Laterality: Left;   POLYPECTOMY  08/20/2022   Procedure: POLYPECTOMY;  Surgeon: Shellia Cleverly, DO;  Location: [facility];  Service: Gastroenterology;;   REVISON OF ARTERIOVENOUS FISTULA Left 07/15/2021   Procedure: REVISON OF LEFT ARTERIOVENOUS FISTULA;  Surgeon: Cephus Shelling, MD;  [facility];  Service: Vascular;  Laterality: Left;   REVISON OF ARTERIOVENOUS FISTULA Left 05/01/2022   Procedure: ARTERIOVENOUS FISTULA WASHOUT OF ARM HEMATOMA;  Surgeon: [name], MD;  Location: [facility];  Service: Vascular;  Laterality: Left;     A IV Location/Drains/Wounds Patient Lines/Drains/Airways Status     Active Line/Drains/Airways     Name Placement date Placement time Site Days   Peripheral IV 03/19/23 20 G Left;Posterior Forearm 03/19/23  0020  Forearm  less than 1   Fistula / Graft Right Upper arm Arteriovenous fistula 11/28/22  0923  Upper arm  111   Fistula / Graft Right Upper arm Arteriovenous fistula 02/18/23  1217  Upper arm  29   Fistula / Graft Right Upper arm Arteriovenous fistula 03/14/23  0624  Upper arm  5   Hemodialysis Catheter Right Femoral vein Double lumen Permanent (Tunneled) 03/05/23  1638  Femoral vein  14            Intake/Output Last 24 hours No intake or output data in the 24 hours ending 03/19/23 4401  Labs/Imaging Results for orders placed or performed during the hospital encounter of 03/18/23 (from the past 48 hour(s))  Protime-INR     Status: Abnormal   Collection Time: 03/18/23 10:55 PM  Result Value Ref Range   Prothrombin Time 15.9 (H) 11.4 - 15.2 seconds   INR 1.3 (H) 0.8 - 1.2    Comment: (NOTE) INR goal varies based on device and disease states. Performed at [facility]  Lab, [address], [place], [place] [address]   APTT     Status: None   Collection Time: 03/18/23 10:55 PM  Result Value Ref Range   aPTT 33 24 - 36 seconds    Comment: Performed at [facility] Lab, [address], [place], [place] [address]  CBC     Status: Abnormal   Collection Time: 03/18/23 10:55 PM  Result Value Ref Range   WBC 5.7 4.0 - 10.5 K/uL   RBC 3.68 (L) 4.22 - 5.81 MIL/uL   Hemoglobin 10.4 (L) 13.0 - 17.0 g/dL   HCT 03.4 (L) 74.2 - 59.5 %   MCV 88.9 80.0 - 100.0 fL   MCH 28.3 26.0 - 34.0 pg   MCHC 31.8 30.0 - 36.0 g/dL   RDW 63.8 (H) 75.6 - 43.3 %   Platelets 171 150 - 400 K/uL   nRBC 0.0 0.0 - 0.2 %    Comment: Performed at [facility]  Lab, [address], [place], [place] [address]  Differential     Status: None   Collection Time: 03/18/23 10:55 PM  Result Value Ref Range   Neutrophils Relative % 58 %   Neutro Abs 3.4 1.7 - 7.7 K/uL   Lymphocytes Relative 24 %   Lymphs Abs 1.3 0.7 - 4.0 K/uL   Monocytes Relative 12 %   Monocytes Absolute 0.7 0.1 - 1.0 K/uL   Eosinophils Relative 5 %   Eosinophils Absolute 0.3 0.0 - 0.5 K/uL   Basophils Relative 1 %   Basophils Absolute 0.0 0.0 - 0.1 K/uL   Immature Granulocytes 0 %   Abs Immature Granulocytes 0.01 0.00 - 0.07 K/uL    Comment: Performed at [facility] Lab, [address], [place], [place] [address]  Comprehensive metabolic panel     Status: Abnormal   Collection Time: 03/18/23 10:55 PM  Result Value Ref Range   Sodium 138 135 - 145 mmol/L   Potassium 4.9 3.5 - 5.1 mmol/L   Chloride 101 98 - 111 mmol/L   CO2 22 22 - 32 mmol/L   Glucose, Bld 127 (H) 70 - 99 mg/dL    Comment: Glucose reference range applies only to samples taken after fasting for at least 8 hours.   BUN 45 (H) 8 - 23 mg/dL   Creatinine, Ser 8.41 (H) 0.61 - 1.24 mg/dL   Calcium 66.0 (H) 8.9 - 10.3 mg/dL   Total Protein 7.9 6.5 - 8.1 g/dL   Albumin 3.5 3.5 - 5.0 g/dL   AST 18 15 - 41 U/L   ALT 13 0 - 44 U/L   Alkaline Phosphatase  62 38 - 126 U/L   Total Bilirubin 0.5 0.3 - 1.2 mg/dL   GFR, Estimated 7 (L) >60 mL/min    Comment: (NOTE) Calculated using the CKD-EPI Creatinine Equation (2021)    Anion gap 15 5 - 15    Comment: Performed at [facility], [address], [place], [place] [address]  Ethanol     Status: None   Collection Time: 03/18/23 10:55 PM  Result Value Ref Range   Alcohol, Ethyl (B) <10 <10 mg/dL    Comment: (NOTE) Lowest detectable limit for serum alcohol is 10 mg/dL.  For medical purposes only. Performed at [facility], [address], [place], [place] [address]   I-stat chem 8, ED     Status: Abnormal   Collection Time: 03/18/23 11:00 PM  Result Value Ref Range   Sodium 139 135 - 145 mmol/L   Potassium 5.0 3.5 - 5.1 mmol/L   Chloride 105 98 - 111 mmol/L   BUN 42 (H) 8 - 23 mg/dL   Creatinine, Ser 2.35 (H) 0.61 - 1.24 mg/dL   Glucose, Bld 573 (H) 70 - 99 mg/dL    Comment: Glucose reference range applies only to samples taken after fasting for at least 8 hours.   Calcium, Ion 1.24 1.15 - 1.40 mmol/L   TCO2 24 22 - 32 mmol/L   Hemoglobin 11.2 (L) 13.0 - 17.0 g/dL   HCT 22.0 (L) 25.4 - 27.0 %  Ammonia     Status: None   Collection Time: 03/19/23 12:14 AM  Result Value Ref Range   Ammonia 27 9 - 35 umol/L    Comment: Performed at [facility], [address], [place], [place] [address]  Phosphorus     Status: Abnormal   Collection Time: 03/19/23 12:14 AM  Result Value  Ref Range   Phosphorus 8.1 (H) 2.5 - 4.6 mg/dL    Comment: Performed at [facility], [address], [place], [place] [address]  Magnesium     Status: Abnormal   Collection Time: 03/19/23 12:14 AM  Result Value Ref Range   Magnesium 2.9 (H) 1.7 - 2.4 mg/dL    Comment: Performed at [facility], [address] [place], [place] [address]  Troponin I (High Sensitivity)     Status: Abnormal   Collection Time: 03/19/23 12:14 AM  Result Value Ref Range   Troponin I (High Sensitivity) 43  (H) <18 ng/L    Comment: (NOTE) Elevated high sensitivity troponin I (hsTnI) values and significant  changes across serial measurements may suggest ACS but many other  chronic and acute conditions are known to elevate hsTnI results.  Refer to the "Links" section for chest pain algorithms and additional  guidance. Performed at [facility] Lab, [address], [place], [place] [address]   I-stat chem 8, ed     Status: Abnormal   Collection Time: 03/19/23 12:24 AM  Result Value Ref Range   Sodium 139 135 - 145 mmol/L   Potassium 5.0 3.5 - 5.1 mmol/L   Chloride 105 98 - 111 mmol/L   BUN 47 (H) 8 - 23 mg/dL   Creatinine, Ser 6.64 (H) 0.61 - 1.24 mg/dL   Glucose, Bld 403 (H) 70 - 99 mg/dL    Comment: Glucose reference range applies only to samples taken after fasting for at least 8 hours.   Calcium, Ion 1.23 1.15 - 1.40 mmol/L   TCO2 25 22 - 32 mmol/L   Hemoglobin 10.9 (L) 13.0 - 17.0 g/dL   HCT 47.4 (L) 25.9 - 56.3 %  I-Stat venous blood gas, ED     Status: Abnormal   Collection Time: 03/19/23 12:36 AM  Result Value Ref Range   pH, Ven 7.363 7.25 - 7.43   pCO2, Ven 46.2 44 - 60 mmHg   pO2, Ven 69 (H) 32 - 45 mmHg   Bicarbonate 26.3 20.0 - 28.0 mmol/L   TCO2 28 22 - 32 mmol/L   O2 Saturation 93 %   Acid-Base Excess 0.0 0.0 - 2.0 mmol/L   Sodium 139 135 - 145 mmol/L   Potassium 5.1 3.5 - 5.1 mmol/L   Calcium, Ion 1.22 1.15 - 1.40 mmol/L   HCT 32.0 (L) 39.0 - 52.0 %   Hemoglobin 10.9 (L) 13.0 - 17.0 g/dL   Sample type VENOUS   Troponin I (High Sensitivity)     Status: Abnormal   Collection Time: 03/19/23  2:08 AM  Result Value Ref Range   Troponin I (High Sensitivity) 45 (H) <18 ng/L    Comment: (NOTE) Elevated high sensitivity troponin I (hsTnI) values and significant  changes across serial measurements may suggest ACS but many other  chronic and acute conditions are known to elevate hsTnI results.  Refer to the "Links" section for chest pain algorithms and additional   guidance. Performed at [facility], [address], [place], [place] [address]    CT Cervical Spine Wo Contrast  Result Date: 03/19/2023 CLINICAL DATA:  Neck trauma EXAM: CT CERVICAL SPINE WITHOUT CONTRAST TECHNIQUE: Multidetector CT imaging of the cervical spine was performed without intravenous contrast. Multiplanar CT image reconstructions were also generated. RADIATION DOSE REDUCTION: This exam was performed according to the departmental dose-optimization program which includes automated exposure control, adjustment of the mA and/or kV according to  patient size and/or use of iterative reconstruction technique. COMPARISON:  None Available. FINDINGS: Alignment: No static subluxation. Facets are aligned. Occipital condyles and the lateral masses of C1 and C2 are normally approximated. Skull base and vertebrae: No acute fracture. C3 and C4 posterior decompression. Soft tissues and spinal canal: No prevertebral fluid or swelling. No visible canal hematoma. Disc levels: No advanced spinal canal or neural foraminal stenosis. Multilevel moderate facet hypertrophy. Upper chest: No pneumothorax, pulmonary nodule or pleural effusion. Other: Normal visualized paraspinal cervical soft tissues. IMPRESSION: 1. No acute fracture or static subluxation of the cervical spine. 2. C3 and C4 posterior decompression. Electronically Signed   By: Deatra Robinson M.D.   On: 03/19/2023 00:17   DG Chest Port 1 View  Result Date: 03/18/2023 CLINICAL DATA:  Fall, altered mental status. EXAM: PORTABLE CHEST 1 VIEW COMPARISON:  03/13/2023 FINDINGS: Stable cardiomegaly. Unchanged mediastinal contours. Previous question pleural effusions have diminished. Stable streaky retrocardiac scarring. No acute airspace disease or pneumothorax. No pulmonary edema. No acute osseous findings. IMPRESSION: Stable cardiomegaly. No acute chest finding. Electronically Signed   By: Narda Rutherford M.D.   On: 03/18/2023 23:57   CT HEAD WO  CONTRAST  Result Date: 03/18/2023 CLINICAL DATA:  Tremor and weakness EXAM: CT HEAD WITHOUT CONTRAST TECHNIQUE: Contiguous axial images were obtained from the base of the skull through the vertex without intravenous contrast. RADIATION DOSE REDUCTION: This exam was performed according to the departmental dose-optimization program which includes automated exposure control, adjustment of the mA and/or kV according to patient size and/or use of iterative reconstruction technique. COMPARISON:  None Available. FINDINGS: Brain: There is no mass, hemorrhage or extra-axial collection. The size and configuration of the ventricles and extra-axial CSF spaces are normal. There is hypoattenuation of the white matter, most commonly indicating chronic small vessel disease. Vascular: No abnormal hyperdensity of the major intracranial arteries or dural venous sinuses. No intracranial atherosclerosis. Skull: The visualized skull base, calvarium and extracranial soft tissues are normal. Sinuses/Orbits: No fluid levels or advanced mucosal thickening of the visualized paranasal sinuses. No mastoid or middle ear effusion. The orbits are normal. IMPRESSION: 1. No acute intracranial abnormality. 2. Chronic small vessel disease. Electronically Signed   By: Deatra Robinson M.D.   On: 03/18/2023 23:17    Pending Labs Unresulted Labs (From admission, onward)     Start     Ordered   03/19/23 0808  Hepatitis B surface antigen  (New Admission Hemo Labs (Hepatitis B))  ONCE - URGENT,   URGENT        03/19/23 0807   03/19/23 0808  Hepatitis B surface antibody,quantitative  (New Admission Hemo Labs (Hepatitis B))  ONCE - URGENT,   URGENT        03/19/23 0807   03/18/23 2247  Rapid urine drug screen (hospital performed)  ONCE - STAT,   STAT        03/18/23 2247            Vitals/Pain Today's Vitals   03/19/23 0200 03/19/23 0230 03/19/23 0419 03/19/23 0604  BP: (!) 137/59 132/62  (!) 150/62  Pulse: 66 70  67  Resp: 11 10  10    Temp:   (!) 97.5 F (36.4 C)   TempSrc:      SpO2: 100% 95%  100%    Isolation Precautions No active isolations  Medications Medications  apixaban (ELIQUIS) tablet 5 mg (has no administration in time range)  diclofenac Sodium (VOLTAREN) 1 % topical gel 4 g (has  no administration in time range)  lidocaine (LIDODERM) 5 % 1 patch (has no administration in time range)  multivitamin (RENA-VIT) tablet 1 tablet (has no administration in time range)  pantoprazole (PROTONIX) EC tablet 40 mg (has no administration in time range)  sevelamer carbonate (RENVELA) tablet 1,600 mg (1,600 mg Oral Given 03/19/23 0805)  tamsulosin (FLOMAX) capsule 0.4 mg (has no administration in time range)  ipratropium-albuterol (DUONEB) 0.5-2.5 (3) MG/3ML nebulizer solution 3 mL (has no administration in time range)  Chlorhexidine Gluconate Cloth 2 % PADS 6 each (has no administration in time range)    Mobility walks     Focused Assessments Musculoskeletal   R Recommendations: See Admitting Provider Note  Report given to:   Additional Notes:

## 2023-03-19 NOTE — ED Notes (Signed)
Pt more awake at this time and requesting food. Pt made aware of NPO diet order.

## 2023-03-19 NOTE — Progress Notes (Signed)
Discharge instructions given. Patient verbalized understanding and all questions were answered.  ?

## 2023-03-19 NOTE — ED Notes (Signed)
Patient going to dialysis prior to going to the floor

## 2023-03-19 NOTE — Hospital Course (Addendum)
Wife provides history Pt dropping the plate on the ground and the phone Was able to speak Then had trouble speaking and couldn't smile normally when he got to the hospital  Complained of dizziness similar to low BP event in the past Feeling very sleepy prior to coming to the ED last time This time he has been shaking  No other symptoms known   Last admission he was more just unresponsive  Admitted 03/18/2023  Allergies: Patient has no known allergies. 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 male p/w confusion and tremors  * Encephalopathy: seems toxic/metabolic, cocaine vs gabapentin toxicity, neuro on board  Consults: neuro  Meds: amlo, eliquis, asa, lasix, hydral, imdur, labetalol, ppi, flomax VTE ppx: eliquis IVF: none Diet: NPO     8/8: Patient evaluated at bedside, wife present. Wife reports new onset tremors and shaky episodes that started yesterday while he was having lunch. He dropped his plate and his shaking got worse. Patient reports he feels sleepy. No chest pain. Patient reported taking Gabapentin once/twice a day, he started Gaba about a week ago. He is not taking Oxycodone, last one he took was yesterday. For the past two weeks, he only took one dose of oxy. Patient is alert an oriented to name, month, year, and location. Patient reports taking gabapentin for his shoulder, knee, and back.   Patient has a tunnel catheter on his anterior left proximal lower extremity.   CN II to XII intact. +myoclonus   Plan: HD to get gaba out.  - Tylenol and lidocaine are added!

## 2023-03-19 NOTE — Consult Note (Signed)
Renal Service Consult Note [facility]  Ronald Reeves 03/19/2023 Ronald Krabbe, MD Requesting Physician: Dr. Lafonda Mosses   Reason for Consult: ESRD pt w/ myoclonus HPI: The patient is a 67 y.o. year-old w/ PMH as below who presented to ED w/ difficulty speaking, tremors in his arms for about 1 week. Was here admitted for similar issue last week. He was started on gapapentin about 4 wks ago reportedly. In ED VSS, afebrile. Pt showed intermittent jerking of the arms and legs and some hypersomnolence. Hb 10.4, creat 8.1, Ca 10.5, glu 127. Pt was admitted overnight last night. We are asked to see for dialysis.   Pt seen in HD. In good spirits, still having tremors in his UE's. No CP., SOB or cough. No HD access issues, has R fem TDC.    ROS - denies CP, no joint pain, no HA, no blurry vision, no rash, no diarrhea, no nausea/ vomiting   Past Medical History  Past Medical History:  Diagnosis Date   Anemia    Arthritis    Back pain    Bronchitis    COPD (chronic obstructive pulmonary disease) (HCC)    Coronary artery disease    COVID    mild - flu like symptoms   Dyspnea    w/ exertion, uses inhaler   ESRD on hemodialysis (HCC)    dialysis on tues, thurs, sat at [facility]   GERD (gastroesophageal reflux disease)    not a current problem   Heart murmur    never has caused any problems   Hypertension    Myocardial infarction (HCC)    Pneumonia    x 1   Pre-diabetes    diet controlled, no meds, does not check blood sugar   Substance abuse (HCC)    Past Surgical History  Past Surgical History:  Procedure Laterality Date   AV FISTULA PLACEMENT Left 04/12/2021   Procedure: LEFT ARM ARTERIOVENOUS (AV) FISTULA CREATION;  Surgeon: Cephus Shelling, MD;  Location: [facility] OR;  Service: Vascular;  Laterality: Left;   AV FISTULA PLACEMENT Left 10/18/2021   Procedure: LEFT ARM BRACHIOBASILIC FISTULA CREATION FIRST STAGE;  Surgeon: Cephus Shelling, MD;  Location: [facility] OR;  Service:  Vascular;  Laterality: Left;   AV FISTULA PLACEMENT Right 11/28/2022   Procedure: RIGHT ARM FIRST STAGE BRACHIOBASILIC FISTULA CREATION;  Surgeon: Cephus Shelling, MD;  Location: [facility] OR;  Service: Vascular;  Laterality: Right;   BACK SURGERY     BASCILIC VEIN TRANSPOSITION Left 01/27/2022   Procedure: LEFT SECOND STAGE BASILIC VEIN TRANSPOSITION;  Surgeon: Cephus Shelling, MD;  Location: [facility] OR;  Service: Vascular;  Laterality: Left;   BASCILIC VEIN TRANSPOSITION Right 02/18/2023   Procedure: SECOND STAGE RIGHT ARM BASILIC VEIN TRANSPOSITION;  Surgeon: Cephus Shelling, MD;  Location: [facility] OR;  Service: Vascular;  Laterality: Right;   BIOPSY  08/20/2022   Procedure: BIOPSY;  Surgeon: Shellia Cleverly, DO;  Location: [facility];  Service: Gastroenterology;;   COLONOSCOPY WITH PROPOFOL N/A 08/20/2022   Procedure: COLONOSCOPY WITH PROPOFOL;  Surgeon: Shellia Cleverly, DO;  Location: [facility];  Service: Gastroenterology;  Laterality: N/A;   DIALYSIS/PERMA CATHETER INSERTION Right 03/05/2023   Procedure: DIALYSIS/PERMA CATHETER INSERTION;  Surgeon: Cephus Shelling, MD;  Location: [facility] INVASIVE CV LAB;  Service: Cardiovascular;  Laterality: Right;   ESOPHAGOGASTRODUODENOSCOPY (EGD) WITH PROPOFOL N/A 08/20/2022   Procedure: ESOPHAGOGASTRODUODENOSCOPY (EGD) WITH PROPOFOL;  Surgeon: Shellia Cleverly, DO;  Location: [facility];  Service: Gastroenterology;  Laterality:  N/A;   INSERTION OF DIALYSIS CATHETER Right 05/01/2022   Procedure: INSERTION OF DIALYSIS CATHETER;  Surgeon: [name], MD;  Location: [facility] OR;  Service: Vascular;  Laterality: Right;   IR REMOVAL TUN CV CATH W/O FL  12/19/2022   LIGATION OF COMPETING BRANCHES OF ARTERIOVENOUS FISTULA Left 07/15/2021   Procedure: LIGATION OF COMPETING BRANCHES OF LEFT RADIOCEPHALIC ARTERIOVENOUS FISTULA TIMES TWO;  Surgeon: Cephus Shelling, MD;  Location: [facility] OR;  Service: Vascular;  Laterality: Left;   POLYPECTOMY  08/20/2022    Procedure: POLYPECTOMY;  Surgeon: Shellia Cleverly, DO;  Location: [facility];  Service: Gastroenterology;;   REVISON OF ARTERIOVENOUS FISTULA Left 07/15/2021   Procedure: REVISON OF LEFT ARTERIOVENOUS FISTULA;  Surgeon: Cephus Shelling, MD;  Location: [facility] OR;  Service: Vascular;  Laterality: Left;   REVISON OF ARTERIOVENOUS FISTULA Left 05/01/2022   Procedure: ARTERIOVENOUS FISTULA WASHOUT OF ARM HEMATOMA;  Surgeon: [name], MD;  Location: [facility] OR;  Service: Vascular;  Laterality: Left;   Family History  Family History  Problem Relation Age of Onset   Heart disease Mother    Heart attack Sister 68   Social History  reports that he has been smoking cigars and cigarettes. He has been exposed to tobacco smoke. He has never used smokeless tobacco. He reports current alcohol use of about 14.0 standard drinks of alcohol per week. He reports current drug use. Frequency: 1.00 time per week. Drugs: Marijuana and Cocaine. Allergies  Allergies  Allergen Reactions   Gabapentin     Encephalopathy, tremor   Home medications Prior to Admission medications   Medication Sig Start Date End Date Taking? Authorizing Provider  albuterol (VENTOLIN HFA) 108 (90 Base) MCG/ACT inhaler Inhale 2 puffs into the lungs every 6 (six) hours as needed for wheezing or shortness of breath.    [provider]  amLODipine (NORVASC) 10 MG tablet Take 1 tablet (10 mg total) by mouth daily. 01/01/18   Lewayne Bunting, MD  apixaban (ELIQUIS) 5 MG TABS tablet Take 1 tablet (5 mg total) by mouth 2 (two) times daily. 03/06/23   Atway, Derwood Kaplan, DO  aspirin EC 81 MG tablet Take 81 mg by mouth daily.    [provider]  Blood Pressure KIT Please check blood pressure every day, and keep a log. 12/07/22   Arrien, York Ram, MD  diclofenac Sodium (VOLTAREN) 1 % GEL Apply 1 Application topically 2 (two) times daily as needed (pain).    [provider]  furosemide (LASIX) 40 MG tablet Take 40  mg by mouth 2 (two) times daily.    [provider]  hydrALAZINE (APRESOLINE) 25 MG tablet Take 1 tablet (25 mg total) by mouth daily. Patient taking differently: Take 25 mg by mouth See admin instructions. 25 mg twice daily, except 1 tablet daily on dialysis days (taken after dialysis). 12/07/22   Arrien, York Ram, MD  isosorbide mononitrate (IMDUR) 30 MG 24 hr tablet Take 1 tablet (30 mg total) by mouth daily. 01/01/18   Lewayne Bunting, MD  labetalol (NORMODYNE) 100 MG tablet Take 2 tablets (200 mg total) by mouth every morning. Patient taking differently: Take 100 mg by mouth See admin instructions. 100 mg twice daily, except 1 tablet daily on dialysis days (taken after dialysis). 01/01/18   Lewayne Bunting, MD  labetalol (NORMODYNE) 100 MG tablet Take 100 mg by mouth 2 (two) times daily. 01/21/23   [provider]  lidocaine (LIDODERM) 5 % Place 1  patch onto the skin daily as needed (pain). Remove & Discard patch within 12 hours or as directed by MD    [provider]  multivitamin (RENA-VIT) TABS tablet Take 1 tablet by mouth daily.    [provider]  naloxone [facility]) nasal spray 4 mg/0.1 mL Place 0.4 mg into the nose See admin instructions.  SPRAY 1 SPRAY INTO ONE NOSTRIL AS DIRECTED AS NEEDED FOR RESCUE FOR OPIOID OVERDOSE - CALL 911 IMMEDIATELY, ADMINISTER DOSE, THEN TURN PERSON ON SIDE - IF NO RESPONSE IN 2-3 MINUTES OR PERSON RESPONDS BUT RELAPSES, REPEAT USING A NEW SPRAY DEVICE AND SPRAY INTO THE OTHER NOSTRIL AS NEEDED FOR RESCUE 01/21/23   [provider]  omeprazole (PRILOSEC) 40 MG capsule Take 1 capsule (40 mg total) by mouth daily. Patient taking differently: Take 40 mg by mouth 2 (two) times daily as needed. 06/06/22   Arnaldo Natal, NP  sevelamer carbonate (RENVELA) 800 MG tablet Take 1,600 mg by mouth 3 (three) times daily with meals. 01/06/23   [provider]  sorbitol 70 % solution Take 10 mLs by mouth 3  (three) times daily as needed (constipation). 01/20/23   [provider]  tamsulosin (FLOMAX) 0.4 MG CAPS capsule Take 0.4 mg by mouth 2 (two) times daily.    [provider]     Vitals:   03/19/23 0900 03/19/23 0930 03/19/23 1000 03/19/23 1117  BP: (!) 157/64 (!) 151/61 (!) 146/60 (!) 145/59  Pulse:    72  Resp: 20 15  19   Temp:    97.7 F (36.5 C)  TempSrc:      SpO2:    98%   Exam Gen alert, no distress No rash, cyanosis or gangrene Sclera anicteric, throat clear  No jvd or bruits Chest clear bilat to bases, no rales/ wheezing RRR no MRG Abd soft ntnd no mass or ascites +bs GU defer MS no joint effusions or deformity Ext no LE or UE edema, no wounds or ulcers Neuro is alert, Ox 3 , mild asterixis +    R femoral TDC intact/ running       Home meds include - albuterol, novasc 10, aspirin, furosemide 40 bid, hydralazine 25 mg 1-2 per day, isosorbide mononitrate 30, labetalol 100-200 1-2 x per day, narcan prn, sorbitol, apixaban, diclofenac gel, lidocaine patch, renavite, omeprazole, sevelamer carbonate 1600 mg ac tid, tamsulosin     OP HD: [facility]  4h   400/1.5   63.5kg   2/3 bath   TDC   Heparin 5000+ - last OP HD 8/06, post wt 64.5kg  - rocaltrol 2.0 mcg po three times per week - venofer 50mg  IV weekly - mircera 30 mcg IV q 4 wks, last 8/06, due 8/20   Assessment/ Plan: AMS w/ myoclonic jerking - 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 Medication has been stopped, should avoid in the future. Getting HD today, this should help clear some of the drug.  ESRD - on HD TTS. Very good compliance, but does tend to sign off early around 3-3.5 hrs. HD today.  HTN/ volume - BP's up a bit. Can resume some of his BP meds after HD is pressures still up.  No gross vol overload, lungs clear and on RA.  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 Just got OP dose on 8/06.  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 Phos is  high at 8, cont renvela 2 ac tid.  HFrEF - EF of 30-35% H/o DVT - on eliquis H/o CVA on asa COPD      Rob   MD [facility] 03/19/2023, 12:05 PM  Recent Labs  Lab 03/14/23 0538 03/18/23 2255 03/18/23 2300 03/19/23 0014 03/19/23 0024 03/19/23 0036  HGB 10.1* 10.4* 11.2*  --  10.9* 10.9*  ALBUMIN 3.1* 3.5  --   --   --   --   CALCIUM 9.3 10.5*  --   --   --   --   PHOS  --   --   --  8.1*  --   --   CREATININE 8.34* 8.14* 9.40*  --  9.30*  --   K 4.9 4.9 5.0  --  5.0 5.1   Inpatient medications:  apixaban  5 mg Oral BID   Chlorhexidine Gluconate Cloth  6 each Topical Q0600   diclofenac Sodium  4 g Topical QID   multivitamin  1 tablet Oral QHS   pantoprazole  40 mg Oral Daily   sevelamer carbonate  1,600 mg Oral TID WC   tamsulosin  0.4 mg Oral BID    acetaminophen, ipratropium-albuterol, lidocaine

## 2023-03-19 NOTE — H&P (Signed)
Date: 03/19/2023               Patient Name:  Ronald Reeves MRN: [id number]  DOB: 09-26-1955 Age / Sex: 67 y.o., male   PCP: Benjiman Core, MD         Medical Service: Internal Medicine Teaching Service         Attending Physician: Dr. Mercie Eon, MD      First Contact: Dr. Kathleen Lime, MD Pager [phone]    Second Contact: Dr. 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 Around time of evaluation in the ED, he also developed difficulty speaking.  He has experienced tremors in both arms for approximately one week but until today they were mild. This evening, he had significant difficulty holding objects in his hands. His wife at bedside has also noticed the tremors but agrees that they were more mild earlier this week. They chose to visit the ED this evening because the tremors were getting worse. This evening approximately when he arrived at ED, [name] developed difficulty moving his face, such as smiling, and difficultly articulating his words. He has not been sick, no fevers or chills, no sick contacts, no chest pain, no palpitations, no new medicines or missed doses, no falls.  He was hospitalized last week for acute encephalopathy after presenting to [facility] ED with drowsiness, confusion, and R sided weakness. At that time, the symptoms arose shortly after he consumed crack cocaine. His wife noted tonight that he consumed cocaine this evening and does most days, but his habits have not changed recently.  Majority of history by patient's wife at bedside. Ronald [name] was somnolent  during most of the exam but awoke enough to answer some questions and respond to all commands with effort.  ED Course: Arrived at [facility] ED around 2244 complaining of arm tremors. Vitals BP 120/41, Pulse 73, T 97.6, Resp 16. SpO2 96%. Was found to exhibit intermittent myoclonic jerks of b/l arms and legs as well as somnolence, mumbling speech, and drowsiness.   Labs: CBC without white count and Hg 10.4. CMP Cr 8.14 near baseline, Calcium 10.5, Glu 127. EtOH negative. Ammonia WNL. Phosphorus acutely elevated 8.1. Mg elevated 2.9. Troponins 43 and 45. Urine Tox pending.  Imaging: CT head no acute abnormality, chronic small vessel disease. CXR stable cardiomegaly without acute finding. CT cervical spine no acute abnormality.   Neurology was consulted.  Past Medical History ESRD on TTS HD CVA rade II diastolic HF with reduced EF 30-35% DVT on Eliquis Substance use disorder - twice daily crack cocaine consumption MI/CAD COPD GERD Anemia of Chronic Disease  Meds:  Albuterol Amlodipine 10 every day Eliquis 5 BID ASA 81 every day Lasix 40 BID Gabapentin 300 BID Hydralazine 25 every day Imdur 30 every day Labetalol 100 BID but 100 only on dialysis day Omeprazole 40 every day Renvela 1600 TID Flomax 0.4mg  BID Sorbitol 70% 10mL TID prn Voltaren 1% gel BID Lidocaine patch Multivitamin Naloxone  Past Surgical History Past Surgical History:  Procedure Laterality Date  AV FISTULA PLACEMENT Left 04/12/2021   Procedure: LEFT ARM ARTERIOVENOUS (AV) FISTULA CREATION;  Surgeon: Cephus Shelling, MD;  Location: [facility] OR;  Service: Vascular;  Laterality: Left;   AV FISTULA PLACEMENT Left 10/18/2021   Procedure: LEFT ARM BRACHIOBASILIC FISTULA CREATION FIRST STAGE;  Surgeon: Cephus Shelling, MD;  Location: [facility] OR;  Service: Vascular;  Laterality: Left;   AV FISTULA PLACEMENT Right 11/28/2022   Procedure: RIGHT ARM FIRST STAGE BRACHIOBASILIC FISTULA CREATION;  Surgeon: Cephus Shelling,  MD;  Location: [facility] OR;  Service: Vascular;  Laterality: Right;   BACK SURGERY     BASCILIC VEIN TRANSPOSITION Left 01/27/2022   Procedure: LEFT SECOND STAGE BASILIC VEIN TRANSPOSITION;  Surgeon: Cephus Shelling, MD;  Location: [facility] OR;  Service: Vascular;  Laterality: Left;   BASCILIC VEIN TRANSPOSITION Right 02/18/2023   Procedure: SECOND STAGE RIGHT ARM BASILIC VEIN TRANSPOSITION;  Surgeon: Cephus Shelling, MD;  Location: [facility] OR;  Service: Vascular;  Laterality: Right;   BIOPSY  08/20/2022   Procedure: BIOPSY;  Surgeon: Shellia Cleverly, DO;  Location: [facility] END[facility]COPY;  Service: Gastroenterology;;   COLONOSCOPY WITH PROPOFOL N/A 08/20/2022   Procedure: COLONOSCOPY WITH PROPOFOL;  Surgeon: Shellia Cleverly, DO;  Location: [facility];  Service: Gastroenterology;  Laterality: N/A;   DIALYSIS/PERMA CATHETER INSERTION Right 03/05/2023   Procedure: DIALYSIS/PERMA CATHETER INSERTION;  Surgeon: Cephus Shelling, MD;  Location: [facility] INVASIVE CV LAB;  Service: Cardiovascular;  Laterality: Right;   ESOPHAGOGASTRODUODENOSCOPY (EGD) WITH PROPOFOL N/A 08/20/2022   Procedure: ESOPHAGOGASTRODUODENOSCOPY (EGD) WITH PROPOFOL;  Surgeon: Shellia Cleverly, DO;  Location: [facility];  Service: Gastroenterology;  Laterality: N/A;   INSERTION OF DIALYSIS CATHETER Right 05/01/2022   Procedure: INSERTION OF DIALYSIS CATHETER;  Surgeon: [name], MD;  Location: [facility] OR;  Service: Vascular;  Laterality: Right;   IR REMOVAL TUN CV CATH W/O FL  12/19/2022   LIGATION OF COMPETING BRANCHES OF ARTERIOVENOUS FISTULA Left 07/15/2021   Procedure: LIGATION OF COMPETING BRANCHES OF LEFT RADIOCEPHALIC ARTERIOVENOUS FISTULA TIMES TWO;  Surgeon: Cephus Shelling, MD;  Location: [facility] OR;  Service: Vascular;  Laterality: Left;   POLYPECTOMY  08/20/2022   Procedure: POLYPECTOMY;  Surgeon: Shellia Cleverly, DO;  Location: [facility];  Service: Gastroenterology;;   REVISON OF ARTERIOVENOUS FISTULA Left 07/15/2021    Procedure: REVISON OF LEFT ARTERIOVENOUS FISTULA;  Surgeon: Cephus Shelling, MD;  Location: [facility] OR;  Service: Vascular;  Laterality: Left;   REVISON OF ARTERIOVENOUS FISTULA Left 05/01/2022   Procedure: ARTERIOVENOUS FISTULA WASHOUT OF ARM HEMATOMA;  Surgeon: [name], MD;  Location: [facility] OR;  Service: Vascular;  Laterality: Left;    Social:  Lives at home with wife Level of Function: independent in ADLs/IADLs PCP: Benjiman Core, MD Substances: Cocaine twice daily, occasional marijuana, alcohol daily (approx 14 drinks/ week upon chart review), cigar and cigarette smoker  Family History:  Heart disease in mother and sister  Allergies: NKDA  Review of Systems: A complete ROS was negative except as per HPI.   OBJECTIVE:   Physical Exam: Blood pressure 132/62, pulse 70, temperature 97.6 F (36.4 C), temperature source Oral, resp. rate 10, SpO2 95%.  Constitutional:Somnolent in bed but became slightly more alert throughout exam. He is slow to respond. Speech is mumbled. In no acute distress. HENT: Normocephalic, atraumatic,  Eyes: Sclera non-icteric, PERRL, EOM intact Neck:normal atraumatic, no neck masses, normal thyroid, no jvd Cardio:Regular rate and rhythm. No murmurs, rubs, or gallops. 2+ bilateral radial and dorsalis pedis  pulses. Pulm:Clear to auscultation bilaterally. Normal work of breathing on room air. Abdomen: Soft, non-tender, non-distended, positive bowel sounds. WUJ:[id number] for extremity edema. Intermittent myoclonic jerks of bilateral UE, occasionally of LE Skin:Warm and dry. Neuro:Alert and oriented x3. No focal deficit noted. Cranial nerves intact. Sensation intact.  Psych:Pleasant mood and affect.  Labs: CBC    Component Value Date/Time   WBC 5.7 03/18/2023 2255   RBC 3.68 (L) 03/18/2023 2255   HGB 10.9 (L) 03/19/2023 0036   HCT 32.0 (L) 03/19/2023 0036   PLT 171 03/18/2023 2255   MCV 88.9 03/18/2023 2255   MCH 28.3 03/18/2023 2255    MCHC 31.8 03/18/2023 2255   RDW 15.8 (H) 03/18/2023 2255   LYMPHSABS 1.3 03/18/2023 2255   MONOABS 0.7 03/18/2023 2255   EOSABS 0.3 03/18/2023 2255   BASOSABS 0.0 03/18/2023 2255     CMP     Component Value Date/Time   NA 139 03/19/2023 0036   NA 140 09/22/2017 0856   K 5.1 03/19/2023 0036   CL 105 03/19/2023 0024   CO2 22 03/18/2023 2255   GLUCOSE 118 (H) 03/19/2023 0024   BUN 47 (H) 03/19/2023 0024   BUN 12 09/22/2017 0856   CREATININE 9.30 (H) 03/19/2023 0024   CREATININE 1.08 09/01/2013 1615   CALCIUM 10.5 (H) 03/18/2023 2255   PROT 7.9 03/18/2023 2255   ALBUMIN 3.5 03/18/2023 2255   AST 18 03/18/2023 2255   ALT 13 03/18/2023 2255   ALKPHOS 62 03/18/2023 2255   BILITOT 0.5 03/18/2023 2255   GFRNONAA 7 (L) 03/18/2023 2255   GFRNONAA 77 09/01/2013 1615   GFRAA 50 (L) 09/22/2017 0856   GFRAA 89 09/01/2013 1615    Imaging: CT Cervical Spine Wo Contrast  Result Date: 03/19/2023 CLINICAL DATA:  Neck trauma EXAM: CT CERVICAL SPINE WITHOUT CONTRAST TECHNIQUE: Multidetector CT imaging of the cervical spine was performed without intravenous contrast. Multiplanar CT image reconstructions were also generated. RADIATION DOSE REDUCTION: This exam was performed according to the departmental dose-optimization program which includes automated exposure control, adjustment of the mA and/or kV according to patient size and/or use of iterative reconstruction technique. COMPARISON:  None Available. FINDINGS: Alignment: No static subluxation. Facets are aligned. Occipital condyles and the lateral masses of C1 and C2 are normally approximated. Skull base and vertebrae: No acute fracture. C3 and C4 posterior decompression. Soft tissues and spinal canal: No prevertebral fluid or swelling. No visible canal hematoma. Disc levels: No advanced spinal canal or neural foraminal stenosis. Multilevel moderate facet hypertrophy. Upper chest: No pneumothorax, pulmonary nodule or pleural effusion. Other: Normal  visualized paraspinal cervical soft tissues. IMPRESSION: 1. No acute fracture or static subluxation of the cervical spine. 2. C3 and C4 posterior decompression. Electronically Signed   By: Deatra Robinson M.D.   On: 03/19/2023 00:17   DG Chest Port 1 View  Result Date: 03/18/2023 CLINICAL DATA:  Fall, altered mental status. EXAM: PORTABLE CHEST 1 VIEW COMPARISON:  03/13/2023 FINDINGS: Stable cardiomegaly. Unchanged mediastinal contours. Previous question pleural effusions have diminished. Stable streaky retrocardiac scarring. No acute airspace disease or pneumothorax. No pulmonary edema. No acute osseous findings. IMPRESSION: Stable cardiomegaly. No acute chest finding. Electronically Signed   By: Narda Rutherford M.D.   On: 03/18/2023 23:57   CT HEAD WO CONTRAST  Result Date: 03/18/2023 CLINICAL DATA:  Tremor and weakness EXAM: CT HEAD WITHOUT CONTRAST TECHNIQUE: Contiguous axial images were obtained from the base of the skull through the vertex without intravenous contrast. RADIATION DOSE REDUCTION: This  exam was performed according to the departmental dose-optimization program which includes automated exposure control, adjustment of the mA and/or kV according to patient size and/or use of iterative reconstruction technique. COMPARISON:  None Available. FINDINGS: Brain: There is no mass, hemorrhage or extra-axial collection. The size and configuration of the ventricles and extra-axial CSF spaces are normal. There is hypoattenuation of the white matter, most commonly indicating chronic small vessel disease. Vascular: No abnormal hyperdensity of the major intracranial arteries or dural venous sinuses. No intracranial atherosclerosis. Skull: The visualized skull base, calvarium and extracranial soft tissues are normal. Sinuses/Orbits: No fluid levels or advanced mucosal thickening of the visualized paranasal sinuses. No mastoid or middle ear effusion. The orbits are normal. IMPRESSION: 1. No acute intracranial  abnormality. 2. Chronic small vessel disease. Electronically Signed   By: Deatra Robinson M.D.   On: 03/18/2023 23:17     EKG: personally reviewed my interpretation is sinus rhythm, PAC. Consistent with prior EKG.  ASSESSMENT & PLAN:   Assessment & Plan by Problem: Principal Problem:   Acute encephalopathy  Gil Vanweelden is a 67 y.o. male with pertinent PMH of ESRD on TTS HD, CVA, grade II diastolic HF with reduced EF 30-35%, DVT on Eliquis, and substance use disorder who presented with arm tremors and difficulty speaking and is admitted for acute encephalopathy.  Acute Encephalopathy Substance Use Disorder - Cocaine Pt is Aox3 but is experiencing: Progressive myoclonic jerking for 1 week that are bilateral and intermittent and most severe in the UEs but also the LEs. Speech that is mumbled, a new finding tonight. Altered mentation with hypersomnolence, appearing to fall asleep multiple times during examination, a similar symptom from his hospitalization last week.  Fortunately cranial imaging is negative and he is not experiencing any focal deficit so can rule out an acute stroke. He is afebrile without white count or symptoms of acute illness, not currently suspicious of an infectious cause. Sugars and major electrolytes are stable, ethanol negative and toxicology pending. Most likely he is experiencing a toxic encephalopathy. He is a twice daily cocaine user but it would also be worthwhile to consider impact of medication buildup in this ESRD patient (such as Gabapentin). It appears pt's gabapentin was discontinued in 2021 but restarted earlier this year. This is now his second hospitalization for encephalopathy this year. - Neurology involved, appreciate it - CT head, CT cervical spine, CXR negative for acute process - Tox screen pending - Neuro Checks - CCM for 24 hours - Hold home gabapentin - He has passed bedside swallow, Renal diet  ESRD on HD TTS Anemia of Chronic  Disease Hyperphosphatemia Pt attends HD on TTS. Will reach out to nephrology in the am to coordinate Thursday dialysis. His anemia is stable, Hg 10.4. Phosphorus is high at 8.1. Otherwise, he is stable. - continue Renvela 1600 TID - Dialysis Thursday, TTS schedule - Consult nephrology - Monitor CBC, CMP, Phosphorus  Grade II diastolic HF with reduced EF 30-35% HTN Classification per Echo in May 2024. He is euvolemic on exam. No dyspnea. Not concerned for acute exacerbation. He is not currently hypertensive. He does have mildly elevated troponin but they are stable, possibly influenced by cocaine use. Will await resuming home medicines until his medications can be clarified. - hold home Lasix 40 BID - hold home Imdur 30 qd - hold home Amlodipine 10 every day - hold home Labetalol 100 BID except for 100 only on dialysis days  Peripheral Neuropathy Low back pain Will hold his home  gabapentin per concern that it might contribute to his somnolence. - continue home lidocaine 5% patch prn - continue home Voltaren 1% gel QID  History DVT on Eliquis No leg swelling or pain on exam. - continue home Eliquis 5mg  BID   History CVA - hold home aspirin 81mg  to clarify med list but anticipate starting. It appears he was given a statin at his last hospitalization but that is not on his med list so it is possible he did not start it. COPD - duonebs q6h prn GERD - pantoprazole 40 qd BPH - continue home flomax 0.4 BID   Diet: Renal VTE: DOAC IVF: None,None Code: Full  Dispo: Admit patient to Observation with expected length of stay less than 2 midnights.  Signed: Katheran James, DO Internal Medicine Resident PGY-1  03/19/2023, 4:03 AM   Dr. Kathleen Lime, MD Pager [phone]

## 2023-03-19 NOTE — Evaluation (Signed)
Physical Therapy Evaluation Patient Details Name: Ronald Reeves MRN: [id number] DOB: Oct 01, 1955 Today's Date: 03/19/2023  History of Present Illness  Patient is 67 y.o. male presented to [facility] ED complaining of tremors of the bil UE's and difficulty gripping objects that has worsened since discharge ~1 Week ago (pt left AMA 8/3). Around time of arrival to the ED, he also developed difficulty speaking and moving his face. Neurology consulted and suspect his myoclonic-like movements of all 4 limbs and somnolence are most likely secondary to gabapentin toxicity in the setting of his ESRD. PMH includes ESRD on HD, anemia, arthritis,COPD, CAD, HFrEF, polysubstance abuse, HTN, GERD, BPH.   Clinical Impression  Ronald Reeves is 67 y.o. male admitted with above HPI and diagnosis. Patient is currently limited by functional impairments below (see PT problem list). Patient lives with spouse and reports mod independent with SPC and occasional use of RW at baseline; however pt reports at least 10 falls in last 2 months. Currently he continues to have tremors in all extremities, weakness, and gait ataxic with poor foot placement that worsens as pt fatigues. Pt required min assist with RW to ambulate ~180'. BP stable with activity and pt denied dizziness. Patient will benefit from continued skilled PT interventions to address impairments and progress independence with mobility. Acute PT will follow and progress as able.         If plan is discharge home, recommend the following: A little help with walking and/or transfers;A little help with bathing/dressing/bathroom;Assistance with cooking/housework;Direct supervision/assist for medications management;Assist for transportation;Help with stairs or ramp for entrance;Supervision due to cognitive status   Can travel by private vehicle        Equipment Recommendations None recommended by PT  Recommendations for Other Services       Functional Status Assessment Patient has  had a recent decline in their functional status and demonstrates the ability to make significant improvements in function in a reasonable and predictable amount of time.     Precautions / Restrictions Precautions Precautions: Fall Restrictions Weight Bearing Restrictions: No      Mobility  Bed Mobility Overal bed mobility: Needs Assistance Bed Mobility: Supine to Sit, Sit to Supine     Supine to sit: Supervision Sit to supine: Supervision   General bed mobility comments: close sup for safety to  move supine<>sit on stretcher    Transfers Overall transfer level: Needs assistance Equipment used: Rolling walker (2 wheels) Transfers: Sit to/from Stand Sit to Stand: Contact guard assist           General transfer comment: close CGA for safety, pt with posterior lean initially and using legs against bed to stabilize.    Ambulation/Gait Ambulation/Gait assistance: Min assist Gait Distance (Feet): 180 Feet (80, 180) Assistive device: Rolling walker (2 wheels) Gait Pattern/deviations: Step-through pattern, Decreased step length - left, Decreased step length - right, Decreased stride length, Ataxic, Decreased dorsiflexion - left, Decreased dorsiflexion - right, Narrow base of support, Drifts right/left Gait velocity: decr        Stairs            Wheelchair Mobility     Tilt Bed    Modified Rankin (Stroke Patients Only)       Balance Overall balance assessment: Needs assistance Sitting-balance support: Feet supported Sitting balance-Leahy Scale: Fair     Standing balance support: During functional activity, Reliant on assistive device for balance, Bilateral upper extremity supported Standing balance-Leahy Scale: Poor  Pertinent Vitals/Pain Pain Assessment Pain Assessment: No/denies pain    Home Living Family/patient expects to be discharged to:: Private residence Living Arrangements: Spouse/significant other  (spouse works full time) Available Help at Discharge: Family Type of Home: House Home Access: Stairs to enter Entrance Stairs-Rails: None Entrance Stairs-Number of Steps: 3   Home Layout: One level Home Equipment: Agricultural consultant (2 wheels);Rollator (4 wheels);Cane - single point;Shower seat Additional Comments: pt reports at least 10 falls in the last 1-2 months. using SPC in home, pt reports he will use the wall to catch his balance to lean against it to keep from falling or lower himself slowly to ground.    Prior Function Prior Level of Function : Needs assist;Driving             Mobility Comments: ambulates with SPC recently, reports falls due to lightheadedness and tremors in last 2 weeks. ADLs Comments: Pt reports he is Independent to Mod I with ADLs at baseline. Pt reports wife has assisted with LB dressing recently due to dizziness and lightheadedness when bending forward.     Extremity/Trunk Assessment   Upper Extremity Assessment Upper Extremity Assessment: Defer to OT evaluation    Lower Extremity Assessment Lower Extremity Assessment: Generalized weakness;RLE deficits/detail;LLE deficits/detail    Cervical / Trunk Assessment Cervical / Trunk Assessment: Normal  Communication   Communication Communication:  (slighlty mumbled/slurred speech)  Cognition Arousal: Alert Behavior During Therapy: Impulsive, Restless Overall Cognitive Status: Impaired/Different from baseline (family not forthcoming on cognitive baseline) Area of Impairment: Orientation, Attention, Memory, Following commands, Safety/judgement, Awareness, Problem solving                 Orientation Level:  (oriented x4 but pt with poor awareness of situation and overall functional status) Current Attention Level: Sustained, Selective Memory: Decreased recall of precautions, Decreased short-term memory Following Commands: Follows one step commands inconsistently Safety/Judgement: Decreased  awareness of safety, Decreased awareness of deficits Awareness: Intellectual Problem Solving: Requires verbal cues General Comments: pt impulsive and has poor safety awareness. pt tangental and having difficulty focusing on education from therapist and MD (present at end of session)        General Comments      Exercises     Assessment/Plan    PT Assessment Patient needs continued PT services  PT Problem List Decreased strength;Decreased range of motion;Decreased activity tolerance;Decreased balance;Decreased mobility;Decreased knowledge of use of DME;Decreased safety awareness;Decreased knowledge of precautions;Impaired sensation;Decreased skin integrity;Decreased cognition;Decreased coordination       PT Treatment Interventions Patient/family education;Neuromuscular re-education;Balance training;Therapeutic exercise;Therapeutic activities;Functional mobility training;Stair training;Gait training;Manual techniques;DME instruction;Cognitive remediation    PT Goals (Current goals can be found in the Care Plan section)  Acute Rehab PT Goals Patient Stated Goal: go home PT Goal Formulation: With patient Time For Goal Achievement: 04/02/23 Potential to Achieve Goals: Fair    Frequency Min 1X/week     Co-evaluation               AM-PAC PT "6 Clicks" Mobility  Outcome Measure Help needed turning from your back to your side while in a flat bed without using bedrails?: None Help needed moving from lying on your back to sitting on the side of a flat bed without using bedrails?: A Little Help needed moving to and from a bed to a chair (including a wheelchair)?: A Little Help needed standing up from a chair using your arms (e.g., wheelchair or bedside chair)?: A Little Help needed to walk in hospital room?: A Little  Help needed climbing 3-5 steps with a railing? : A Little 6 Click Score: 19    End of Session Equipment Utilized During Treatment: Gait belt Activity Tolerance:  Patient tolerated treatment well Patient left: in bed;with call bell/phone within reach;with family/visitor present (MD present) Nurse Communication: Mobility status PT Visit Diagnosis: Muscle weakness (generalized) (M62.81);Difficulty in walking, not elsewhere classified (R26.2);Other abnormalities of gait and mobility (R26.89);Unsteadiness on feet (R26.81);Other symptoms and signs involving the nervous system (R29.898);Ataxic gait (R26.0)    Time: 7564-3329 PT Time Calculation (min) (ACUTE ONLY): 30 min   Charges:   PT Evaluation $PT Eval Moderate Complexity: 1 Mod PT Treatments $Gait Training: 8-22 mins PT General Charges $$ ACUTE PT VISIT: 1 Visit         Wynn Maudlin, DPT Acute Rehabilitation Services Office [phone]  03/19/23 10:34 AM

## 2023-03-19 NOTE — Progress Notes (Signed)
SLP Cancellation Note  Patient Details Name: Ronald Reeves MRN: [id number] DOB: Jan 13, 1956   Cancelled treatment:       Reason Eval/Treat Not Completed: SLP screened, no needs identified, will sign off  Order received for swallow eval but pt has now passed the [place] swallow screen and has been started on a diet. Per RN, there are no concerns about swallowing at this time. Will therefore defer our swallow eval. Please re-order with any acute concerns or consider cognitive-linguistic eval if symptoms of difficulty speaking persist despite medical tx plan.     Mahala Menghini., M.A. CCC-SLP Acute Rehabilitation Services Office [phone]  Secure chat preferred  03/19/2023, 8:54 AM

## 2023-03-19 NOTE — Discharge Summary (Signed)
Name: Ronald Reeves MRN: [id number] DOB: 01/17/1956 67 y.o. PCP: Benjiman Core, MD  Date of Admission: 03/18/2023 10:30 PM Date of Discharge: 03/19/2023 4:35 Attending Physician: Dr. Lafonda Mosses  Discharge Diagnosis: Principal Problem:   Acute encephalopathy Active Problems:   Gabapentin-induced toxicity   ESRD on dialysis [facility])    Discharge Medications: Allergies as of 03/19/2023       Reactions   Gabapentin    Encephalopathy, tremor        Medication List     STOP taking these medications    Blood Pressure Kit   hydrALAZINE 25 MG tablet Commonly known as: APRESOLINE   labetalol 100 MG tablet Commonly known as: NORMODYNE       TAKE these medications    albuterol 108 (90 Base) MCG/ACT inhaler Commonly known as: VENTOLIN HFA Inhale 2 puffs into the lungs every 6 (six) hours as needed for wheezing or shortness of breath.   amLODipine 10 MG tablet Commonly known as: NORVASC Take 1 tablet (10 mg total) by mouth daily.   apixaban 5 MG Tabs tablet Commonly known as: ELIQUIS Take 1 tablet (5 mg total) by mouth 2 (two) times daily.   aspirin EC 81 MG tablet Take 81 mg by mouth daily.   furosemide 40 MG tablet Commonly known as: LASIX Take 40 mg by mouth 2 (two) times daily.   isosorbide mononitrate 30 MG 24 hr tablet Commonly known as: IMDUR Take 1 tablet (30 mg total) by mouth daily.   lidocaine 5 % Commonly known as: LIDODERM Place 1 patch onto the skin daily as needed (pain). Remove & Discard patch within 12 hours or as directed by MD   multivitamin Tabs tablet Take 1 tablet by mouth daily.   naloxone 4 MG/0.1ML Liqd nasal spray kit Commonly known as: NARCAN Place 0.4 mg into the nose See admin instructions.  SPRAY 1 SPRAY INTO ONE NOSTRIL AS DIRECTED AS NEEDED FOR RESCUE FOR OPIOID OVERDOSE - CALL 911 IMMEDIATELY, ADMINISTER DOSE, THEN TURN PERSON ON SIDE - IF NO RESPONSE IN 2-3 MINUTES OR PERSON RESPONDS BUT RELAPSES, REPEAT USING A NEW SPRAY  DEVICE AND SPRAY INTO THE OTHER NOSTRIL AS NEEDED FOR RESCUE   omeprazole 40 MG capsule Commonly known as: PRILOSEC Take 1 capsule (40 mg total) by mouth daily. What changed:  when to take this reasons to take this   sevelamer carbonate 800 MG tablet Commonly known as: RENVELA Take 1,600 mg by mouth 3 (three) times daily with meals.   sorbitol 70 % solution Take 10 mLs by mouth 3 (three) times daily as needed (constipation).   tamsulosin 0.4 MG Caps capsule Commonly known as: FLOMAX Take 0.4 mg by mouth 2 (two) times daily.   Voltaren 1 % Gel Generic drug: diclofenac Sodium Apply 1 Application topically 2 (two) times daily as needed (pain).        Disposition and follow-up:   [name] was discharged from [facility] in [place] condition.  At the hospital follow up visit please address:  1.  Follow-up:  a.  Mental status: Presented with altered mental status in setting of gabapentin toxicity.    b.  Hypertension, patient was hypertensive throughout stay after holding home blood pressure medications.  Restarted most of his home medications for blood pressure, including: Amlodipine 10, Imdur 30, and Lasix 40 twice daily.  Did not continue hydralazine 25 or labetalol.  Did not restart all of them as we did not want to risk inducing hypotensive episode.  Can restart hydralazine and labetalol at next dialysis session 8/10.   2.  Labs / imaging needed at time of follow-up: BMP  3.  Medication Changes  Discontinued the following medications: Gabapentin, labetalol, hydralazine.  Can restart labetalol and hydralazine as tolerated.   Follow-up Appointments:   Hospital Course by problem list:   #Gabapentin toxicity in the setting of ESRD #ESRD on hemodialysis #Acute encephalopathy   Patient appeared somewhat sleepy this on morning of 8/8, but was largely attentive to interview.  Amenable to discontinuing gabapentin and no longer has oxy available to him, but  had concerns about pain control going forward.  Received 4 hours hemodialysis 8/8 via right femoral tunneled dialysis catheter.  Held all home BP medications throughout stay.  Neurology is following -- per a.m. neuro note: Team agrees with gabapentin toxicity, found asterixis on neuro exam.     #Stimulant use disorder, cocaine type, ongoing UDS positive for: Cocaine, THC.  No acute intracranial process per imaging.   #Grade 2 diastolic heart failure with reduced ejection fraction (30-35%) #Hypertension BPs ranging from 140s to 150s over 50s to 70s. Continued to hold home medications including Lasix 40 twice daily, Imdur 30 daily, labetalol 100 twice daily.  Restarted these meds on discharge.  Not restart hydralazine and labetalol to avoid hypotensive episode-outpatient dialysis provider can restart these when tolerated.   #Peripheral neuropathy #Low back pain #Mobility Continued lidocaine and Voltaren gel throughout the day.  PT/OT were consulted but patient's left AMA before he could be evaluated.   #History of DVT on Eliquis 5 BID at home Continued this regimen while in hospital.  Discharge Subjective:  No acute events overnight.  No PRNs.  No medication refusals.   Patient evaluated at bedside, wife present. Wife reports new onset tremors and shaky episodes that started yesterday while he was having lunch. He dropped his plate and his shaking got worse. Patient reports he feels sleepy. No chest pain. Patient reported taking Gabapentin once/twice a day -- starting about a week ago. For the past two weeks, he only took one dose of oxy, which was yesterday.  He has not missed his regular dialysis.  Patient is alert an oriented to name, month, year, and location. Patient reports taking gabapentin for his shoulder, knee, and back.  Informed patient that he will no longer be taking this medication, and would need inpatient dialysis to clear medication from bloodstream.  Patient amenable to  inpatient dialysis -- which he received later in the day.   On reinterview later in afternoon during dialysis, patient expressed desire to leave.  Team made it clear that our recommendation would be to stay in the hospital another night and repeat dialysis tomorrow and that leaving at this point would be AGAINST MEDICAL ADVICE.  Patient acknowledged this, saying that he was trying to set up outpatient dialysis tomorrow in his usual facility.  Team advised patient to avoid gabapentin and opioid use at home.  Patient gave permission to reach out to wife regarding safe discharge planning.  Patient reached out to wife and grandson [phone] to arrange safe discharge.  Advised family to keep close watch on patient and to return him to hospital if he develops the tremors again or presents with altered mental status.  Family was amenable.  Discharge Exam:   BP (!) 140/57 (BP Location: Right Arm)   Pulse 79   Temp 98 F (36.7 C) (Oral)   Resp 16   SpO2 100%  Constitutional: Ill-appearing  male, lying in bed, sleepy Cardiovascular: Regular rate and rhythm with no murmurs or rubs or gallops Respiratory: Clear to auscultation bilaterally Abdominal: Bowel sounds present, no tenderness to palpation MSK: Patient has a tunnel catheter on his anterior left proximal lower extremity.  No warmth noted. Neuro: CN II to XII intact. 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, left lower extremity Psych: Largely attentive to interview  Pertinent Labs, Studies, and Procedures:     Latest Ref Rng & Units 03/19/2023   12:36 AM 03/19/2023   12:24 AM 03/18/2023   11:00 PM  CBC  Hemoglobin 13.0 - 17.0 g/dL 95.6  21.3  08.6   Hematocrit 39.0 - 52.0 % 32.0  32.0  33.0        Latest Ref Rng & Units 03/19/2023   12:36 AM 03/19/2023   12:24 AM 03/18/2023   11:00 PM  CMP  Glucose 70 - 99 mg/dL  578  469   BUN 8 - 23 mg/dL  47  42   Creatinine 6.29 - 1.24 mg/dL  5.28  4.13   Sodium 244 - 145 mmol/L 139  139  139    Potassium 3.5 - 5.1 mmol/L 5.1  5.0  5.0   Chloride 98 - 111 mmol/L  105  105     CT Cervical Spine Wo Contrast  Result Date: 03/19/2023 CLINICAL DATA:  Neck trauma EXAM: CT CERVICAL SPINE WITHOUT CONTRAST TECHNIQUE: Multidetector CT imaging of the cervical spine was performed without intravenous contrast. Multiplanar CT image reconstructions were also generated. RADIATION DOSE REDUCTION: This exam was performed according to the departmental dose-optimization program which includes automated exposure control, adjustment of the mA and/or kV according to patient size and/or use of iterative reconstruction technique. COMPARISON:  None Available. FINDINGS: Alignment: No static subluxation. Facets are aligned. Occipital condyles and the lateral masses of C1 and C2 are normally approximated. Skull base and vertebrae: No acute fracture. C3 and C4 posterior decompression. Soft tissues and spinal canal: No prevertebral fluid or swelling. No visible canal hematoma. Disc levels: No advanced spinal canal or neural foraminal stenosis. Multilevel moderate facet hypertrophy. Upper chest: No pneumothorax, pulmonary nodule or pleural effusion. Other: Normal visualized paraspinal cervical soft tissues. IMPRESSION: 1. No acute fracture or static subluxation of the cervical spine. 2. C3 and C4 posterior decompression. Electronically Signed   By: Deatra Robinson M.D.   On: 03/19/2023 00:17   DG Chest Port 1 View  Result Date: 03/18/2023 CLINICAL DATA:  Fall, altered mental status. EXAM: PORTABLE CHEST 1 VIEW COMPARISON:  03/13/2023 FINDINGS: Stable cardiomegaly. Unchanged mediastinal contours. Previous question pleural effusions have diminished. Stable streaky retrocardiac scarring. No acute airspace disease or pneumothorax. No pulmonary edema. No acute osseous findings. IMPRESSION: Stable cardiomegaly. No acute chest finding. Electronically Signed   By: Narda Rutherford M.D.   On: 03/18/2023 23:57   CT HEAD WO  CONTRAST  Result Date: 03/18/2023 CLINICAL DATA:  Tremor and weakness EXAM: CT HEAD WITHOUT CONTRAST TECHNIQUE: Contiguous axial images were obtained from the base of the skull through the vertex without intravenous contrast. RADIATION DOSE REDUCTION: This exam was performed according to the departmental dose-optimization program which includes automated exposure control, adjustment of the mA and/or kV according to patient size and/or use of iterative reconstruction technique. COMPARISON:  None Available. FINDINGS: Brain: There is no mass, hemorrhage or extra-axial collection. The size and configuration of the ventricles and extra-axial CSF spaces are normal. There is hypoattenuation of the white matter, most commonly indicating chronic small vessel disease.  Vascular: No abnormal hyperdensity of the major intracranial arteries or dural venous sinuses. No intracranial atherosclerosis. Skull: The visualized skull base, calvarium and extracranial soft tissues are normal. Sinuses/Orbits: No fluid levels or advanced mucosal thickening of the visualized paranasal sinuses. No mastoid or middle ear effusion. The orbits are normal. IMPRESSION: 1. No acute intracranial abnormality. 2. Chronic small vessel disease. Electronically Signed   By: Deatra Robinson M.D.   On: 03/18/2023 23:17     Discharge Instructions: Discharge Instructions     Diet - low sodium heart healthy   Complete by: As directed    Discharge instructions   Complete by: As directed    Mr. Delante, Llanos were recently admitted to [facility] for overdose of medications. DO NOT take anymore gabapentin or oxycodone.   Continue taking your home medications with the following changes Stop taking 1. Gabapentin 2. Oxycodone 3. Labetalol 4. Hydralazine  Your blood pressure was elevated a bit in the hospital so we restarted some of your home blood pressure medications. Be sure to follow-up with HD on Saturday and they will add back hydral and  labetolol if needed.  If you develop confusion or shaking you need to come back to the emergency room. We recommend that you see your primary care doctor in about a week to make sure that you continue to improve. We are so glad that you are feeling better.  Sincerely, Rudene Christians, DO   Increase activity slowly   Complete by: As directed        Signed: Tomie China, MD 03/19/2023, 4:46 PM   Pager: [phone]

## 2023-03-19 NOTE — Consult Note (Signed)
NEURO HOSPITALIST CONSULT NOTE   Requestig physician: Dr. Madilyn Hook  Reason for Consult: Abnormal limb movements and somnolence  History obtained from:  Wife and Chart     HPI:                                                                                                                                          Ronald Reeves is a 67 y.o. male with a PMHx of ESRD on HD, anemia, arthritis, COPD, CAD, HTN, pre-diabetes and substance abuse who presented to the ED with his wife on Wednesday night for evaluation of somnolence and tremors and irregular jerky movements of his limbs. He reported being unable to grip objects and dropping things, as well as difficulty standing, walking and speaking, the latter three symptoms starting on Wednesday morning. These symptoms have been going on since slightly before he was admitted to the hospital almost a week ago (he left AMA at that time). He was started on Neurontin 300 mg BID approximately 4 weeks ago. Additionally, he uses cocaine twice daily and wife believes he used it on Wednesday morning.   Past Medical History:  Diagnosis Date   Anemia    Arthritis    Back pain    Bronchitis    COPD (chronic obstructive pulmonary disease) (HCC)    Coronary artery disease    COVID    mild - flu like symptoms   Dyspnea    w/ exertion, uses inhaler   ESRD on hemodialysis (HCC)    dialysis on tues, thurs, sat at [facility]   GERD (gastroesophageal reflux disease)    not a current problem   Heart murmur    never has caused any problems   Hypertension    Myocardial infarction (HCC)    Pneumonia    x 1   Pre-diabetes    diet controlled, no meds, does not check blood sugar   Substance abuse (HCC)     Past Surgical History:  Procedure Laterality Date   AV FISTULA PLACEMENT Left 04/12/2021   Procedure: LEFT ARM ARTERIOVENOUS (AV) FISTULA CREATION;  Surgeon: Cephus Shelling, MD;  Location: [facility] OR;  Service: Vascular;  Laterality: Left;   AV FISTULA  PLACEMENT Left 10/18/2021   Procedure: LEFT ARM BRACHIOBASILIC FISTULA CREATION FIRST STAGE;  Surgeon: Cephus Shelling, MD;  Location: [facility] OR;  Service: Vascular;  Laterality: Left;   AV FISTULA PLACEMENT Right 11/28/2022   Procedure: RIGHT ARM FIRST STAGE BRACHIOBASILIC FISTULA CREATION;  Surgeon: Cephus Shelling, MD;  Location: [facility] OR;  Service: Vascular;  Laterality: Right;   BACK SURGERY     BASCILIC VEIN TRANSPOSITION Left 01/27/2022   Procedure: LEFT SECOND STAGE BASILIC VEIN TRANSPOSITION;  Surgeon: Cephus Shelling, MD;  Location: [facility] OR;  Service: Vascular;  Laterality: Left;   BASCILIC VEIN TRANSPOSITION Right 02/18/2023   Procedure: SECOND STAGE RIGHT ARM BASILIC VEIN TRANSPOSITION;  Surgeon: Cephus Shelling, MD;  Location: [facility] OR;  Service: Vascular;  Laterality: Right;   BIOPSY  08/20/2022   Procedure: BIOPSY;  Surgeon: Shellia Cleverly, DO;  Location: [facility];  Service: Gastroenterology;;   COLONOSCOPY WITH PROPOFOL N/A 08/20/2022   Procedure: COLONOSCOPY WITH PROPOFOL;  Surgeon: Shellia Cleverly, DO;  Location: [facility];  Service: Gastroenterology;  Laterality: N/A;   DIALYSIS/PERMA CATHETER INSERTION Right 03/05/2023   Procedure: DIALYSIS/PERMA CATHETER INSERTION;  Surgeon: Cephus Shelling, MD;  Location: [facility] INVASIVE CV LAB;  Service: Cardiovascular;  Laterality: Right;   ESOPHAGOGASTRODUODENOSCOPY (EGD) WITH PROPOFOL N/A 08/20/2022   Procedure: ESOPHAGOGASTRODUODENOSCOPY (EGD) WITH PROPOFOL;  Surgeon: Shellia Cleverly, DO;  Location: [facility];  Service: Gastroenterology;  Laterality: N/A;   INSERTION OF DIALYSIS CATHETER Right 05/01/2022   Procedure: INSERTION OF DIALYSIS CATHETER;  Surgeon: [name], MD;  Location: [facility] OR;  Service: Vascular;  Laterality: Right;   IR REMOVAL TUN CV CATH W/O FL  12/19/2022   LIGATION OF COMPETING BRANCHES OF ARTERIOVENOUS FISTULA Left 07/15/2021   Procedure: LIGATION OF COMPETING BRANCHES OF LEFT RADIOCEPHALIC  ARTERIOVENOUS FISTULA TIMES TWO;  Surgeon: Cephus Shelling, MD;  Location: [facility] OR;  Service: Vascular;  Laterality: Left;   POLYPECTOMY  08/20/2022   Procedure: POLYPECTOMY;  Surgeon: Shellia Cleverly, DO;  Location: [facility];  Service: Gastroenterology;;   REVISON OF ARTERIOVENOUS FISTULA Left 07/15/2021   Procedure: REVISON OF LEFT ARTERIOVENOUS FISTULA;  Surgeon: Cephus Shelling, MD;  Location: [facility] OR;  Service: Vascular;  Laterality: Left;   REVISON OF ARTERIOVENOUS FISTULA Left 05/01/2022   Procedure: ARTERIOVENOUS FISTULA WASHOUT OF ARM HEMATOMA;  Surgeon: [name], MD;  Location: [facility] OR;  Service: Vascular;  Laterality: Left;    Family History  Problem Relation Age of Onset   Heart disease Mother    Heart attack Sister 63              Social History:  reports that he has been smoking cigars and cigarettes. He has been exposed to tobacco smoke. He has never used smokeless tobacco. He reports current alcohol use of about 14.0 standard drinks of alcohol per week. He reports current drug use. Frequency: 1.00 time per week. Drugs: Marijuana and Cocaine.  No Known Allergies  MEDICATIONS:                                                                                                                     No current facility-administered medications on file prior to encounter.   Current Outpatient Medications on File Prior to Encounter  Medication Sig Dispense Refill   albuterol (VENTOLIN HFA) 108 (90 Base) MCG/ACT inhaler Inhale 2 puffs into the lungs every 6 (six) hours as needed for wheezing or shortness of breath.     amLODipine (NORVASC) 10 MG tablet Take 1 tablet (10 mg total) by mouth daily.  90 tablet 3   apixaban (ELIQUIS) 5 MG TABS tablet Take 1 tablet (5 mg total) by mouth 2 (two) times daily.     aspirin EC 81 MG tablet Take 81 mg by mouth daily.     Blood Pressure KIT Please check blood pressure every day, and keep a log. 1 kit 0   diclofenac Sodium (VOLTAREN)  1 % GEL Apply 1 Application topically 2 (two) times daily as needed (pain).     furosemide (LASIX) 40 MG tablet Take 40 mg by mouth 2 (two) times daily.     gabapentin (NEURONTIN) 300 MG capsule Take 300 mg by mouth 2 (two) times daily.     hydrALAZINE (APRESOLINE) 25 MG tablet Take 1 tablet (25 mg total) by mouth daily. (Patient taking differently: Take 25 mg by mouth See admin instructions. 25 mg twice daily, except 1 tablet daily on dialysis days (taken after dialysis).)     isosorbide mononitrate (IMDUR) 30 MG 24 hr tablet Take 1 tablet (30 mg total) by mouth daily. 90 tablet 3   labetalol (NORMODYNE) 100 MG tablet Take 2 tablets (200 mg total) by mouth every morning. (Patient taking differently: Take 100 mg by mouth See admin instructions. 100 mg twice daily, except 1 tablet daily on dialysis days (taken after dialysis).) 180 tablet 3   labetalol (NORMODYNE) 100 MG tablet Take 100 mg by mouth 2 (two) times daily.     lidocaine (LIDODERM) 5 % Place 1 patch onto the skin daily as needed (pain). Remove & Discard patch within 12 hours or as directed by MD     multivitamin (RENA-VIT) TABS tablet Take 1 tablet by mouth daily.     naloxone (NARCAN) nasal spray 4 mg/0.1 mL Place 0.4 mg into the nose See admin instructions.  SPRAY 1 SPRAY INTO ONE NOSTRIL AS DIRECTED AS NEEDED FOR RESCUE FOR OPIOID OVERDOSE - CALL 911 IMMEDIATELY, ADMINISTER DOSE, THEN TURN PERSON ON SIDE - IF NO RESPONSE IN 2-3 MINUTES OR PERSON RESPONDS BUT RELAPSES, REPEAT USING A NEW SPRAY DEVICE AND SPRAY INTO THE OTHER NOSTRIL AS NEEDED FOR RESCUE     omeprazole (PRILOSEC) 40 MG capsule Take 1 capsule (40 mg total) by mouth daily. (Patient taking differently: Take 40 mg by mouth 2 (two) times daily as needed.) 30 capsule 2   sevelamer carbonate (RENVELA) 800 MG tablet Take 1,600 mg by mouth 3 (three) times daily with meals.     sorbitol 70 % solution Take 10 mLs by mouth 3 (three) times daily as needed (constipation).     tamsulosin  (FLOMAX) 0.4 MG CAPS capsule Take 0.4 mg by mouth 2 (two) times daily.        ROS:                                                                                                                                       Unable to obtain  due to somnolence.    Blood pressure (!) 137/59, pulse 66, temperature 97.6 F (36.4 C), temperature source Oral, resp. rate 11, SpO2 100%.   General Examination:                                                                                                       Physical Exam  HEENT-  [place]/AT    Lungs- Respirations unlabored, at times sonorous Extremities- No edema   Neurological Examination Mental Status: Somnolent. Eyes are closed for essentially all of the exam except for transient partial eye opening when aroused and asked to open eyes. Unable to answer any orientation questions. Does not engage in conversation, with one-word, hypophonic and barely intelligible answers to questions only. Will attempt to elevate limbs briefly to command, which results in prominent positive and negative myoclonus.  Cranial Nerves: II: PERRL  III,IV, VI: Eyes are conjugate without forced gaze deviation.   V: Unable to formally assess due to somnolence  VII: Face is symmetric VIII: Hearing intact to voice IX,X: Hypophonic XI: Head is midline XII: Not following command.  Motor: Will attempt to elevate limbs briefly to command, which results in prominent positive and negative myoclonus in all 4 limbs asynchronously. Sensory: Reacts to gross touch.   Deep Tendon Reflexes: Normoactive Cerebellar: Not following commands for assessment  Gait: Unable to assess    Lab Results: Basic Metabolic Panel: Recent Labs  Lab 03/13/23 2053 03/13/23 2141 03/14/23 0538 03/18/23 2255 03/18/23 2300 03/19/23 0014 03/19/23 0024 03/19/23 0036  NA 137 138 136 138 139  --  139 139  K 4.7 4.8 4.9 4.9 5.0  --  5.0 5.1  CL 100 103 99 101 105  --  105  --   CO2 24  --  24 22  --    --   --   --   GLUCOSE 147* 112* 96 127* 124*  --  118*  --   BUN 36* 34* 40* 45* 42*  --  47*  --   CREATININE 7.83* 8.80* 8.34* 8.14* 9.40*  --  9.30*  --   CALCIUM 9.9  --  9.3 10.5*  --   --   --   --   MG  --   --   --   --   --  2.9*  --   --   PHOS  --   --   --   --   --  8.1*  --   --     CBC: Recent Labs  Lab 03/13/23 2051 03/13/23 2141 03/14/23 0538 03/18/23 2255 03/18/23 2300 03/19/23 0024 03/19/23 0036  WBC 5.4  --  6.2 5.7  --   --   --   NEUTROABS 2.4  --   --  3.4  --   --   --   HGB 10.3*   < > 10.1* 10.4* 11.2* 10.9* 10.9*  HCT 32.7*   < > 32.1* 32.7* 33.0* 32.0* 32.0*  MCV 87.7  --  88.2 88.9  --   --   --  PLT 169  --  171 171  --   --   --    < > = values in this interval not displayed.    Cardiac Enzymes: No results for input(s): "CKTOTAL", "CKMB", "CKMBINDEX", "TROPONINI" in the last 168 hours.  Lipid Panel: Recent Labs  Lab 03/14/23 0538  CHOL 141  TRIG 48  HDL 61  CHOLHDL 2.3  VLDL 10  LDLCALC 70    Imaging: CT Cervical Spine Wo Contrast  Result Date: 03/19/2023 CLINICAL DATA:  Neck trauma EXAM: CT CERVICAL SPINE WITHOUT CONTRAST TECHNIQUE: Multidetector CT imaging of the cervical spine was performed without intravenous contrast. Multiplanar CT image reconstructions were also generated. RADIATION DOSE REDUCTION: This exam was performed according to the departmental dose-optimization program which includes automated exposure control, adjustment of the mA and/or kV according to patient size and/or use of iterative reconstruction technique. COMPARISON:  None Available. FINDINGS: Alignment: No static subluxation. Facets are aligned. Occipital condyles and the lateral masses of C1 and C2 are normally approximated. Skull base and vertebrae: No acute fracture. C3 and C4 posterior decompression. Soft tissues and spinal canal: No prevertebral fluid or swelling. No visible canal hematoma. Disc levels: No advanced spinal canal or neural foraminal stenosis.  Multilevel moderate facet hypertrophy. Upper chest: No pneumothorax, pulmonary nodule or pleural effusion. Other: Normal visualized paraspinal cervical soft tissues. IMPRESSION: 1. No acute fracture or static subluxation of the cervical spine. 2. C3 and C4 posterior decompression. Electronically Signed   By: Deatra Robinson M.D.   On: 03/19/2023 00:17   DG Chest Port 1 View  Result Date: 03/18/2023 CLINICAL DATA:  Fall, altered mental status. EXAM: PORTABLE CHEST 1 VIEW COMPARISON:  03/13/2023 FINDINGS: Stable cardiomegaly. Unchanged mediastinal contours. Previous question pleural effusions have diminished. Stable streaky retrocardiac scarring. No acute airspace disease or pneumothorax. No pulmonary edema. No acute osseous findings. IMPRESSION: Stable cardiomegaly. No acute chest finding. Electronically Signed   By: Narda Rutherford M.D.   On: 03/18/2023 23:57   CT HEAD WO CONTRAST  Result Date: 03/18/2023 CLINICAL DATA:  Tremor and weakness EXAM: CT HEAD WITHOUT CONTRAST TECHNIQUE: Contiguous axial images were obtained from the base of the skull through the vertex without intravenous contrast. RADIATION DOSE REDUCTION: This exam was performed according to the departmental dose-optimization program which includes automated exposure control, adjustment of the mA and/or kV according to patient size and/or use of iterative reconstruction technique. COMPARISON:  None Available. FINDINGS: Brain: There is no mass, hemorrhage or extra-axial collection. The size and configuration of the ventricles and extra-axial CSF spaces are normal. There is hypoattenuation of the white matter, most commonly indicating chronic small vessel disease. Vascular: No abnormal hyperdensity of the major intracranial arteries or dural venous sinuses. No intracranial atherosclerosis. Skull: The visualized skull base, calvarium and extracranial soft tissues are normal. Sinuses/Orbits: No fluid levels or advanced mucosal thickening of the  visualized paranasal sinuses. No mastoid or middle ear effusion. The orbits are normal. IMPRESSION: 1. No acute intracranial abnormality. 2. Chronic small vessel disease. Electronically Signed   By: Deatra Robinson M.D.   On: 03/18/2023 23:17     Assessment: 67 y.o. male with a PMHx of ESRD on HD, anemia, arthritis, COPD, CAD, HTN, pre-diabetes and substance abuse who presented to the ED with his wife on Wednesday night for evaluation of somnolence and tremors and irregular jerky movements of his limbs. He reported being unable to grip objects and dropping things, as well as difficulty standing, walking and speaking, the latter three  symptoms starting on Wednesday morning. These symptoms have been going on since slightly before he was admitted to the hospital almost a week ago (he left AMA at that time). He was started on Neurontin 300 mg BID approximately 4 weeks ago. Additionally, he uses cocaine twice daily and wife believes he used it on Wednesday morning.  - Exam reveals prominent negative and positive myoclonus worsened by movement, in conjunction with somnolence.  - CT head: No acute intracranial abnormality. Chronic small vessel disease. 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 Started gabapentin about 1 month ago. This medication is mostly renally cleared, which can result in build up to toxic levels in patients with kidney disease, especially when the Neurontin dosage is high.    Recommendations: - Discontinuing Neurontin.  - Monitor for improvement - Dialyze as soon as feasible.    Electronically signed: Dr. Caryl Pina 03/19/2023, 2:10 AM

## 2023-03-20 NOTE — Progress Notes (Signed)
Late Note Entry- March 20, 2023  D/C noted. Contacted [facility] this morning to advise clinic of pt's d/c date and that pt should resume care tomorrow.   Olivia Canter Renal Navigator [phone]

## 2023-03-24 LAB — DRUG SCREEN 10 W/CONF, SERUM
Amphetamines, IA: NEGATIVE ng/mL
Barbiturates, IA: NEGATIVE ug/mL
Benzodiazepines, IA: NEGATIVE ng/mL
Cocaine & Metabolite, IA: POSITIVE ng/mL — AB
Methadone, IA: NEGATIVE ng/mL
Opiates, IA: NEGATIVE ng/mL
Oxycodones, IA: NEGATIVE ng/mL
Phencyclidine, IA: NEGATIVE ng/mL
Propoxyphene, IA: NEGATIVE ng/mL
THC(Marijuana) Metabolite, IA: POSITIVE ng/mL — AB

## 2023-03-24 LAB — THC,MS,WB/SP RFX
Cannabidiol: NEGATIVE ng/mL
Cannabinoid Confirmation: POSITIVE
Tetrahydrocannabinol(THC): 2.2 ng/mL

## 2023-03-24 LAB — COCAINE,MS,WB/SP RFX
Benzoylecgonine: >1500 ng/mL
Cocaine Confirmation: POSITIVE
Cocaine: NEGATIVE ng/mL

## 2023-05-21 ENCOUNTER — Emergency Department (HOSPITAL_COMMUNITY): Payer: No Typology Code available for payment source

## 2023-05-21 ENCOUNTER — Other Ambulatory Visit: Payer: Self-pay

## 2023-05-21 ENCOUNTER — Encounter (HOSPITAL_COMMUNITY): Payer: Self-pay

## 2023-05-21 ENCOUNTER — Emergency Department (HOSPITAL_COMMUNITY)
Admission: EM | Admit: 2023-05-21 | Discharge: 2023-05-21 | Discharge status: Against Medical Advice | Payer: No Typology Code available for payment source | Attending: Emergency Medicine | Admitting: Emergency Medicine

## 2023-05-21 DIAGNOSIS — Z992 Dependence on renal dialysis: Secondary | ICD-10-CM | POA: Diagnosis not present

## 2023-05-21 DIAGNOSIS — I5020 Unspecified systolic (congestive) heart failure: Secondary | ICD-10-CM | POA: Diagnosis not present

## 2023-05-21 DIAGNOSIS — Z7982 Long term (current) use of aspirin: Secondary | ICD-10-CM | POA: Diagnosis not present

## 2023-05-21 DIAGNOSIS — R7989 Other specified abnormal findings of blood chemistry: Secondary | ICD-10-CM

## 2023-05-21 DIAGNOSIS — Z79899 Other long term (current) drug therapy: Secondary | ICD-10-CM | POA: Diagnosis not present

## 2023-05-21 DIAGNOSIS — N186 End stage renal disease: Secondary | ICD-10-CM | POA: Diagnosis not present

## 2023-05-21 DIAGNOSIS — Z7901 Long term (current) use of anticoagulants: Secondary | ICD-10-CM | POA: Diagnosis not present

## 2023-05-21 DIAGNOSIS — R062 Wheezing: Secondary | ICD-10-CM

## 2023-05-21 DIAGNOSIS — J449 Chronic obstructive pulmonary disease, unspecified: Secondary | ICD-10-CM | POA: Diagnosis not present

## 2023-05-21 DIAGNOSIS — R0602 Shortness of breath: Secondary | ICD-10-CM

## 2023-05-21 DIAGNOSIS — R0609 Other forms of dyspnea: Secondary | ICD-10-CM | POA: Insufficient documentation

## 2023-05-21 DIAGNOSIS — R748 Abnormal levels of other serum enzymes: Secondary | ICD-10-CM

## 2023-05-21 DIAGNOSIS — Z1152 Encounter for screening for COVID-19: Secondary | ICD-10-CM | POA: Insufficient documentation

## 2023-05-21 DIAGNOSIS — I2510 Atherosclerotic heart disease of native coronary artery without angina pectoris: Secondary | ICD-10-CM | POA: Insufficient documentation

## 2023-05-21 LAB — RESP PANEL BY RT-PCR (RSV, FLU A&B, COVID)  RVPGX2
Influenza A by PCR: NEGATIVE
Influenza B by PCR: NEGATIVE
Resp Syncytial Virus by PCR: NEGATIVE
SARS Coronavirus 2 by RT PCR: NEGATIVE

## 2023-05-21 LAB — COMPREHENSIVE METABOLIC PANEL
ALT: 19 U/L (ref 0–44)
AST: 24 U/L (ref 15–41)
Albumin: 3.5 g/dL (ref 3.5–5.0)
Alkaline Phosphatase: 98 U/L (ref 38–126)
Anion gap: 19 — ABNORMAL HIGH (ref 5–15)
BUN: 63 mg/dL — ABNORMAL HIGH (ref 8–23)
CO2: 21 mmol/L — ABNORMAL LOW (ref 22–32)
Calcium: 9.8 mg/dL (ref 8.9–10.3)
Chloride: 101 mmol/L (ref 98–111)
Creatinine, Ser: 11.37 mg/dL — ABNORMAL HIGH (ref 0.61–1.24)
GFR, Estimated: 5 mL/min — ABNORMAL LOW (ref >60)
Glucose, Bld: 148 mg/dL — ABNORMAL HIGH (ref 70–99)
Potassium: 4.6 mmol/L (ref 3.5–5.1)
Sodium: 141 mmol/L (ref 135–145)
Total Bilirubin: 0.6 mg/dL (ref 0.3–1.2)
Total Protein: 8.2 g/dL — ABNORMAL HIGH (ref 6.5–8.1)

## 2023-05-21 LAB — CBC
HCT: 38.8 % — ABNORMAL LOW (ref 39.0–52.0)
Hemoglobin: 12.1 g/dL — ABNORMAL LOW (ref 13.0–17.0)
MCH: 27.5 pg (ref 26.0–34.0)
MCHC: 31.2 g/dL (ref 30.0–36.0)
MCV: 88.2 fL (ref 80.0–100.0)
Platelets: 195 10*3/uL (ref 150–400)
RBC: 4.4 MIL/uL (ref 4.22–5.81)
RDW: 17 % — ABNORMAL HIGH (ref 11.5–15.5)
WBC: 6.2 10*3/uL (ref 4.0–10.5)
nRBC: 0 % (ref 0.0–0.2)

## 2023-05-21 LAB — TROPONIN I (HIGH SENSITIVITY): Troponin I (High Sensitivity): 57 ng/L — ABNORMAL HIGH (ref <18)

## 2023-05-21 LAB — BRAIN NATRIURETIC PEPTIDE: B Natriuretic Peptide: >4500 pg/mL — ABNORMAL HIGH (ref 0.0–100.0)

## 2023-05-21 LAB — D-DIMER, QUANTITATIVE: D-Dimer, Quant: 1.42 ug{FEU}/mL — ABNORMAL HIGH (ref 0.00–0.50)

## 2023-05-21 LAB — LIPASE, BLOOD: Lipase: 169 U/L — ABNORMAL HIGH (ref 11–51)

## 2023-05-21 MED ORDER — METHYLPREDNISOLONE SODIUM SUCC 125 MG IJ SOLR
125.0000 mg | Freq: Once | INTRAMUSCULAR | Status: AC
  Administered 2023-05-21: 125 mg via INTRAVENOUS
  Filled 2023-05-21: qty 2

## 2023-05-21 MED ORDER — ONDANSETRON HCL 4 MG/2ML IJ SOLN
4.0000 mg | Freq: Once | INTRAMUSCULAR | Status: AC
  Administered 2023-05-21: 4 mg via INTRAVENOUS
  Filled 2023-05-21: qty 2

## 2023-05-21 MED ORDER — IPRATROPIUM-ALBUTEROL 0.5-2.5 (3) MG/3ML IN SOLN
3.0000 mL | Freq: Once | RESPIRATORY_TRACT | Status: AC
  Administered 2023-05-21: 3 mL via RESPIRATORY_TRACT
  Filled 2023-05-21: qty 3

## 2023-05-21 MED ORDER — PREDNISONE 10 MG PO TABS: 40.0000 mg | ORAL_TABLET | Freq: Every day | ORAL | 0 refills | Status: AC

## 2023-05-21 NOTE — ED Provider Notes (Signed)
[place] EMERGENCY DEPARTMENT AT [facility] Provider Note   CSN: [id number] Arrival date & time: 05/21/23  4098     History  No chief complaint on file.   Ronald Reeves is a 67 y.o. male.  Ronald Reeves has a history of ESRD on TTH dialysis, COPD not on oxygen, CAD, HFrEF with EF 35%, and polysubstance abuse of frequent cocaine, marijuana, tobacco, and ethanol. Ronald Reeves is presenting with complaint of increased dyspnea on exertion with blood-tinged sputum x 3 days and daily N/V without blood x 1 month. As for his dyspnea and hemoptysis, Ronald Reeves notes his COPD but feels worse over the past few days and also has cold symptoms of rhinorrhea. Ronald Reeves takes eliquis but missed a few doses at the end of September. Ronald Reeves does not report fevers, chills, or green sputum though Ronald Reeves has has observed bloody streaks. Ronald Reeves states that when Ronald Reeves walks Ronald Reeves quickly becomes dyspneic and lightheaded. As for his emesis, Ronald Reeves notes that Ronald Reeves vomits about once per day, often within 1 hour after eating, and Ronald Reeves has mild tenderness and sometimes Ronald Reeves has trouble swallowing his food. Ronald Reeves has not observed blood in the vomitus. Ronald Reeves does report recent cocaine use, yesterday and about 3-4 times per week, as well as recent marijuana use with similar pattern. Ronald Reeves drinks about 25 8oz beers per week.        Home Medications Prior to Admission medications   Medication Sig Start Date End Date Taking? Authorizing Provider  albuterol (VENTOLIN HFA) 108 (90 Base) MCG/ACT inhaler Inhale 2 puffs into the lungs every 6 (six) hours as needed for wheezing or shortness of breath.    [provider]  amLODipine (NORVASC) 10 MG tablet Take 1 tablet (10 mg total) by mouth daily. 01/01/18   Lewayne Bunting, MD  apixaban (ELIQUIS) 5 MG TABS tablet Take 1 tablet (5 mg total) by mouth 2 (two) times daily. 03/06/23   Atway, Derwood Kaplan, DO  aspirin EC 81 MG tablet Take 81 mg by mouth daily.    [provider]  diclofenac Sodium (VOLTAREN) 1 % GEL  Apply 1 Application topically 2 (two) times daily as needed (pain).    [provider]  furosemide (LASIX) 40 MG tablet Take 40 mg by mouth 2 (two) times daily.    [provider]  isosorbide mononitrate (IMDUR) 30 MG 24 hr tablet Take 1 tablet (30 mg total) by mouth daily. 01/01/18   Lewayne Bunting, MD  lidocaine (LIDODERM) 5 % Place 1 patch onto the skin daily as needed (pain). Remove & Discard patch within 12 hours or as directed by MD    [provider]  multivitamin (RENA-VIT) TABS tablet Take 1 tablet by mouth daily.    [provider]  naloxone [facility]) nasal spray 4 mg/0.1 mL Place 0.4 mg into the nose See admin instructions.  SPRAY 1 SPRAY INTO ONE NOSTRIL AS DIRECTED AS NEEDED FOR RESCUE FOR OPIOID OVERDOSE - CALL 911 IMMEDIATELY, ADMINISTER DOSE, THEN TURN PERSON ON SIDE - IF NO RESPONSE IN 2-3 MINUTES OR PERSON RESPONDS BUT RELAPSES, REPEAT USING A NEW SPRAY DEVICE AND SPRAY INTO THE OTHER NOSTRIL AS NEEDED FOR RESCUE 01/21/23   [provider]  omeprazole (PRILOSEC) 40 MG capsule Take 1 capsule (40 mg total) by mouth daily. Patient taking differently: Take 40 mg by mouth 2 (two) times daily as needed. 06/06/22   Arnaldo Natal, NP  sevelamer carbonate (RENVELA) 800 MG tablet Take 1,600 mg  by mouth 3 (three) times daily with meals. 01/06/23   [provider]  sorbitol 70 % solution Take 10 mLs by mouth 3 (three) times daily as needed (constipation). 01/20/23   [provider]  tamsulosin (FLOMAX) 0.4 MG CAPS capsule Take 0.4 mg by mouth 2 (two) times daily.    [provider]      Allergies    Gabapentin    Review of Systems   Review of Systems  Constitutional:  Negative for chills, fatigue and fever.  HENT:  Positive for congestion, rhinorrhea and trouble swallowing. Negative for sore throat.   Respiratory:  Positive for cough, shortness of breath and wheezing. Negative for choking and chest tightness.    Cardiovascular:  Negative for chest pain and palpitations.  Gastrointestinal:  Positive for abdominal pain, nausea and vomiting. Negative for abdominal distention, blood in stool, constipation and diarrhea.  Genitourinary:  Negative for dysuria.  Neurological:  Positive for dizziness, light-headedness and headaches. Negative for seizures, syncope, weakness and numbness.  Psychiatric/Behavioral:  Negative for confusion.     Physical Exam Updated Vital Signs BP (!) 141/73   Pulse 74   Temp 97.9 F (36.6 C)   Resp (!) 26   Ht 5\' 10"  (1.778 m)   Wt 65.2 kg   SpO2 100%   BMI 20.62 kg/m  Physical Exam Constitutional:      General: Ronald Reeves is not in acute distress.    Appearance: Normal appearance. Ronald Reeves is not ill-appearing.  HENT:     Head: Normocephalic and atraumatic.  Cardiovascular:     Rate and Rhythm: Normal rate and regular rhythm.     Pulses: Normal pulses.  Pulmonary:     Effort: Pulmonary effort is normal.     Breath sounds: Wheezing present.  Abdominal:     General: Abdomen is flat. There is distension.     Palpations: Abdomen is soft. There is no mass.     Tenderness: There is abdominal tenderness. There is no guarding or rebound.  Musculoskeletal:     Right lower leg: No edema.     Left lower leg: No edema.  Skin:    General: Skin is warm and dry.     Capillary Refill: Capillary refill takes less than 2 seconds.  Neurological:     General: No focal deficit present.     Mental Status: Ronald Reeves is alert and oriented to person, place, and time.  Psychiatric:        Mood and Affect: Mood normal.        Behavior: Behavior normal.     ED Results / Procedures / Treatments   Labs (all labs ordered are listed, but only abnormal results are displayed) Labs Reviewed  COMPREHENSIVE METABOLIC PANEL - Abnormal; Notable for the following components:      Result Value   CO2 21 (*)    Glucose, Bld 148 (*)    BUN 63 (*)    Creatinine, Ser 11.37 (*)    Total Protein 8.2 (*)     GFR, Estimated 5 (*)    Anion gap 19 (*)    All other components within normal limits  CBC - Abnormal; Notable for the following components:   Hemoglobin 12.1 (*)    HCT 38.8 (*)    RDW 17.0 (*)    All other components within normal limits  RESP PANEL BY RT-PCR (RSV, FLU A&B, COVID)  RVPGX2  LIPASE, BLOOD    EKG EKG Interpretation Date/Time:  Thursday May 21 2023 10:32:12 EDT Ventricular Rate:  66 PR Interval:  204 QRS Duration:  102 QT Interval:  480 QTC Calculation: 503 R Axis:   -21  Text Interpretation: Normal sinus rhythm Possible Left atrial enlargement Left ventricular hypertrophy ( Sokolow-Lyon , Cornell product , Romhilt-Estes ) Anteroseptal infarct , age undetermined ST & T wave abnormality, consider lateral ischemia Prolonged QT Abnormal ECG When compared with ECG of 19-Mar-2023 00:20, PREVIOUS ECG IS PRESENT similar to prior no stemi Confirmed by Tanda Rockers ([id number]) on 05/21/2023 11:00:14 AM  Radiology CT ABDOMEN PELVIS WO CONTRAST  Result Date: 05/21/2023 CLINICAL DATA:  Epigastric abdominal pain for the past month. Postprandial nausea and vomiting. EXAM: CT ABDOMEN AND PELVIS WITHOUT CONTRAST TECHNIQUE: Multidetector CT imaging of the abdomen and pelvis was performed following the standard protocol without IV contrast. RADIATION DOSE REDUCTION: This exam was performed according to the departmental dose-optimization program which includes automated exposure control, adjustment of the mA and/or kV according to patient size and/or use of iterative reconstruction technique. COMPARISON:  CT abdomen pelvis-12/17/2022 FINDINGS: The lack of intravenous contrast limits the ability to evaluate solid abdominal organs. Lower chest: Limited visualization of the lower thorax demonstrates trace bilateral effusions with associated bibasilar ground-glass atelectasis. Cardiomegaly. No pericardial effusion. Femoral approach dialysis catheter tips terminate within the inferior aspect of the  right atrium. Hepatobiliary: Normal hepatic contour. Normal noncontrast appearance of the gallbladder given degree distention. No radiopaque gallstones. No ascites. Pancreas: Normal noncontrast appearance of the pancreas. Spleen: Normal noncontrast appearance of the spleen. Adrenals/Urinary Tract: The bilateral kidneys are expectedly atrophic given history of end-stage renal disease. Previously characterized 1 cm left-sided renal cyst as well as subcentimeter right-sided hypoattenuating renal lesion morphologically unchanged compared to the 12/2022 examination. No evidence of nephrolithiasis. Normal noncontrast appearance of the bilateral adrenal glands. Normal appearance of the urinary bladder given underdistention. Stomach/Bowel: Scattered colonic diverticulosis without evidence superimposed acute diverticulitis. Normal noncontrast appearance of the terminal ileum and retrocecal appendix. Evaluation of the intestines is degraded secondary to lack intravenous contrast and mesenteric/peritoneal fat. No pneumoperitoneum, pneumatosis or portal venous gas. Vascular/Lymphatic: Atherosclerotic plaque within a normal caliber abdominal aorta. Right femoral approach dialysis catheter tip terminates within the inferior aspect of the right atrium. No definitive bulky retroperitoneal, mesenteric, pelvic or inguinal lymphadenopathy on this noncontrast examination, though again, evaluation is degraded secondary to lack of intravenous contrast and significant intra-abdominal fat. Reproductive: Normal noncontrast appearance of the prostate gland. Trace amount of fluid in the pelvic cul-de-sac. Other: Mild diffuse body wall anasarca. Musculoskeletal: No acute or aggressive osseous abnormalities. Sequela of previous L3-L4 paraspinal fusion without evidence of hardware failure or loosening. 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 Mild degenerative change of the bilateral hips with joint space loss,  subchondral sclerosis and osteophytosis. IMPRESSION: 1. No explanation for patient's epigastric abdominal pain. If clinical concern persists, further evaluation with contrasted examination could be performed as indicated. 2. Scattered colonic diverticulosis without evidence superimposed acute diverticulitis. 3. Cardiomegaly with trace bilateral effusions and mild diffuse body wall anasarca, constellation of findings suggestive of CHF/volume overload. 4. Aortic Atherosclerosis (ICD10-I70.0). Electronically Signed   By: Simonne Come M.D.   On: 05/21/2023 12:55   DG Chest 1 View  Result Date: 05/21/2023 CLINICAL DATA:  Shortness of breath and hemoptysis for the past 2-3 days. EXAM: CHEST  1 VIEW COMPARISON:  03/18/2023 FINDINGS: Unchanged enlarged cardiac silhouette and mediastinal contours with atherosclerotic plaque within the thoracic aorta. Femoral approach dialysis catheter tip terminates within the  inferior aspect of the right atrium. Pulmonary vasculature is indistinct with cephalization of flow. No pleural effusion or pneumothorax. No definite acute osseous abnormalities. Post lumbar paraspinal fusion, incompletely evaluated. IMPRESSION: Cardiomegaly with findings suggestive of pulmonary edema. Electronically Signed   By: Simonne Come M.D.   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 Ronald Reeves is presenting with complaint of increased dyspnea on exertion with blood-tinged sputum x 3 days and daily N/V without blood x 1 month. Ronald Reeves missed dialysis today having been sent to the ED for the above complaints. Ronald Reeves is not in acute distress. On workup, vitals are stable  including his O2 saturations. Respiratory panel is unremarkable. Abdominal imaging notes vascular congestion in lower lung fields (and there is elevated BNP), though Ronald Reeves is due for dialysis and does not appear acutely volume overloaded on exam. Lipase elevated to 150 in Ronald Reeves with emesis but no evidence of pancreatic disease per imaging. In fact, abdominal imaging is negative for acute disease. For symptomatic relief, Ronald Reeves was treated with duonebs and solumedrol with some improvement, currently Ronald Reeves does not appear to be in a severe COPD exacerbation.  Ronald Reeves will be handed off for further evaluation at shift change with ultimate disposition at discretion at new team.  Amount and/or Complexity of Data Reviewed Labs: ordered.  Risk Prescription drug management.          Final Clinical Impression(s) / ED Diagnoses Final diagnoses:  None    Rx / DC Orders ED Discharge Orders     None         Katheran James, DO 05/21/23 1606    Alvira Monday, MD 05/31/23 0001

## 2023-05-21 NOTE — ED Notes (Signed)
To CT with transporter

## 2023-05-21 NOTE — ED Provider Notes (Signed)
3:56 PM Assumed care of patient from Dr. Dalene Seltzer. For more details, please see note from same day.  In brief, this is a 67 y.o. male w/ ESRD on HD TuThSat who presents w/ DOE, CP, blood-tinged sputum. Occurring x 1 month but worsening x 2-3 days even after dialysis on Tuesday, full session. Missed some of eliquis (h/o DVT). Had vomiting morning x 1 month w/ epigastric abd pain. CT abd pelvis shows cardiomegaly w/ volume overload. No hypoxia or oxygen requirement. Was due for dialysis today, he called his dialysis center and scheduled his make-up dialysis for tomorrow.  Plan/Dispo at time of sign-out & ED Course since sign-out: [ ]  dimer, CT PE if positive  BP 131/84   Pulse 74   Temp 97.9 F (36.6 C)   Resp 14   Ht 5\' 10"  (1.778 m)   Wt 65.2 kg   SpO2 100%   BMI 20.62 kg/m    ED Course:   Clinical Course as of 05/22/23 2200  Thu May 21, 2023  1609 D-Dimer, Quant(!): 1.42 Elevated, will get CT PE [HN]    Clinical Course User Index [HN] Loetta Rough, MD    Dispo: Had ordered CT PE for elevated dimer. Patient was pending CT PE when he eloped from the emergency department. I did not have opportunity to speak with patient prior to him leaving the department prior to workup being complete.  ------------------------------- Vivi Barrack, MD Emergency Medicine  This note was created using dictation software, which may contain spelling or grammatical errors.   Loetta Rough, MD 05/22/23 2201

## 2023-05-21 NOTE — ED Provider Triage Note (Signed)
Emergency Medicine Provider Triage Evaluation Note  Ronald Reeves , a 67 y.o. male  was evaluated in triage.  ESRD on HD TTS. pt complains of cough x 3 days, occasional streaks of blood in his sputum.  No fevers or chills.  Patient also epigastric abdominal pain, nausea and vomiting intermittently over the past month, postprandial.  No change to bowel function.  Last HD was Tuesday.  He scheduled HD today but they sent him here  Review of Systems  Positive: cough Negative: fever  Physical Exam  BP (!) 144/87   Pulse 76   Temp 97.9 F (36.6 C)   Resp 18   Ht 5\' 10"  (1.778 m)   Wt 65.2 kg   SpO2 100%   BMI 20.62 kg/m  Gen:   Awake, no distress   Resp:  Normal effort, trace wheezing b/l MSK:   Moves extremities without difficulty Other:  HD access RUE palpable thrill  Medical Decision Making  Medically screening exam initiated at 11:31 AM.  Appropriate orders placed.  Ronald Reeves was informed that the remainder of the evaluation will be completed by another provider, this initial triage assessment does not replace that evaluation, and the importance of remaining in the ED until their evaluation is complete.  Screening labs/imaging ordered.    Ronald Reeves A, DO 05/21/23 1131

## 2023-05-21 NOTE — ED Triage Notes (Signed)
Pt c/o SOB and hemoptysisx2-3d. Pt c/o vomiting every morning 4 days out of a weekx14mo.

## 2023-06-10 ENCOUNTER — Other Ambulatory Visit: Payer: Self-pay

## 2023-06-10 ENCOUNTER — Emergency Department (HOSPITAL_COMMUNITY): Payer: No Typology Code available for payment source

## 2023-06-10 ENCOUNTER — Emergency Department (HOSPITAL_COMMUNITY)
Admission: EM | Admit: 2023-06-10 | Discharge: 2023-06-10 | Disposition: A | Discharge status: Home / Self Care | Payer: No Typology Code available for payment source | Attending: Emergency Medicine | Admitting: Emergency Medicine

## 2023-06-10 DIAGNOSIS — N186 End stage renal disease: Secondary | ICD-10-CM | POA: Diagnosis not present

## 2023-06-10 DIAGNOSIS — R197 Diarrhea, unspecified: Secondary | ICD-10-CM | POA: Insufficient documentation

## 2023-06-10 DIAGNOSIS — Z992 Dependence on renal dialysis: Secondary | ICD-10-CM | POA: Diagnosis not present

## 2023-06-10 DIAGNOSIS — R404 Transient alteration of awareness: Secondary | ICD-10-CM | POA: Insufficient documentation

## 2023-06-10 DIAGNOSIS — Z7982 Long term (current) use of aspirin: Secondary | ICD-10-CM | POA: Diagnosis not present

## 2023-06-10 DIAGNOSIS — R1110 Vomiting, unspecified: Secondary | ICD-10-CM | POA: Insufficient documentation

## 2023-06-10 DIAGNOSIS — Z79899 Other long term (current) drug therapy: Secondary | ICD-10-CM | POA: Diagnosis not present

## 2023-06-10 DIAGNOSIS — I2510 Atherosclerotic heart disease of native coronary artery without angina pectoris: Secondary | ICD-10-CM | POA: Insufficient documentation

## 2023-06-10 DIAGNOSIS — R4182 Altered mental status, unspecified: Secondary | ICD-10-CM | POA: Diagnosis present

## 2023-06-10 DIAGNOSIS — J449 Chronic obstructive pulmonary disease, unspecified: Secondary | ICD-10-CM | POA: Insufficient documentation

## 2023-06-10 DIAGNOSIS — I120 Hypertensive chronic kidney disease with stage 5 chronic kidney disease or end stage renal disease: Secondary | ICD-10-CM | POA: Insufficient documentation

## 2023-06-10 DIAGNOSIS — R739 Hyperglycemia, unspecified: Secondary | ICD-10-CM | POA: Insufficient documentation

## 2023-06-10 DIAGNOSIS — Z7901 Long term (current) use of anticoagulants: Secondary | ICD-10-CM | POA: Insufficient documentation

## 2023-06-10 LAB — URINALYSIS, ROUTINE W REFLEX MICROSCOPIC
Bacteria, UA: NONE SEEN
Bilirubin Urine: NEGATIVE
Glucose, UA: 50 mg/dL — AB
Ketones, ur: NEGATIVE mg/dL
Leukocytes,Ua: NEGATIVE
Nitrite: NEGATIVE
Protein, ur: 100 mg/dL — AB
Specific Gravity, Urine: 1.012 (ref 1.005–1.030)
pH: 6 (ref 5.0–8.0)

## 2023-06-10 LAB — COMPREHENSIVE METABOLIC PANEL
ALT: 24 U/L (ref 0–44)
AST: 26 U/L (ref 15–41)
Albumin: 3.6 g/dL (ref 3.5–5.0)
Alkaline Phosphatase: 117 U/L (ref 38–126)
Anion gap: 16 — ABNORMAL HIGH (ref 5–15)
BUN: 24 mg/dL — ABNORMAL HIGH (ref 8–23)
CO2: 30 mmol/L (ref 22–32)
Calcium: 8.4 mg/dL — ABNORMAL LOW (ref 8.9–10.3)
Chloride: 93 mmol/L — ABNORMAL LOW (ref 98–111)
Creatinine, Ser: 5.88 mg/dL — ABNORMAL HIGH (ref 0.61–1.24)
GFR, Estimated: 10 mL/min — ABNORMAL LOW (ref >60)
Glucose, Bld: 89 mg/dL (ref 70–99)
Potassium: 3.5 mmol/L (ref 3.5–5.1)
Sodium: 139 mmol/L (ref 135–145)
Total Bilirubin: 0.4 mg/dL (ref 0.3–1.2)
Total Protein: 7.6 g/dL (ref 6.5–8.1)

## 2023-06-10 LAB — I-STAT VENOUS BLOOD GAS, ED
Acid-Base Excess: 9 mmol/L — ABNORMAL HIGH (ref 0.0–2.0)
Bicarbonate: 34.8 mmol/L — ABNORMAL HIGH (ref 20.0–28.0)
Calcium, Ion: 0.92 mmol/L — ABNORMAL LOW (ref 1.15–1.40)
HCT: 43 % (ref 39.0–52.0)
Hemoglobin: 14.6 g/dL (ref 13.0–17.0)
O2 Saturation: 62 %
Potassium: 3.5 mmol/L (ref 3.5–5.1)
Sodium: 138 mmol/L (ref 135–145)
TCO2: 36 mmol/L — ABNORMAL HIGH (ref 22–32)
pCO2, Ven: 50.6 mm[Hg] (ref 44–60)
pH, Ven: 7.445 — ABNORMAL HIGH (ref 7.25–7.43)
pO2, Ven: 31 mm[Hg] — CL (ref 32–45)

## 2023-06-10 LAB — CBC WITH DIFFERENTIAL/PLATELET
Abs Immature Granulocytes: 0.01 10*3/uL (ref 0.00–0.07)
Basophils Absolute: 0.1 10*3/uL (ref 0.0–0.1)
Basophils Relative: 1 %
Eosinophils Absolute: 0.4 10*3/uL (ref 0.0–0.5)
Eosinophils Relative: 9 %
HCT: 40.6 % (ref 39.0–52.0)
Hemoglobin: 12.9 g/dL — ABNORMAL LOW (ref 13.0–17.0)
Immature Granulocytes: 0 %
Lymphocytes Relative: 22 %
Lymphs Abs: 1 10*3/uL (ref 0.7–4.0)
MCH: 27.6 pg (ref 26.0–34.0)
MCHC: 31.8 g/dL (ref 30.0–36.0)
MCV: 86.8 fL (ref 80.0–100.0)
Monocytes Absolute: 0.6 10*3/uL (ref 0.1–1.0)
Monocytes Relative: 12 %
Neutro Abs: 2.6 10*3/uL (ref 1.7–7.7)
Neutrophils Relative %: 56 %
Platelets: 123 10*3/uL — ABNORMAL LOW (ref 150–400)
RBC: 4.68 MIL/uL (ref 4.22–5.81)
RDW: 16.7 % — ABNORMAL HIGH (ref 11.5–15.5)
WBC: 4.7 10*3/uL (ref 4.0–10.5)
nRBC: 0 % (ref 0.0–0.2)

## 2023-06-10 LAB — CBG MONITORING, ED
Glucose-Capillary: 95 mg/dL (ref 70–99)
Glucose-Capillary: 96 mg/dL (ref 70–99)
Glucose-Capillary: 120 mg/dL — ABNORMAL HIGH (ref 70–99)

## 2023-06-10 NOTE — Discharge Instructions (Signed)
You have declined MRI study to further rule out stroke vs TIA to explain your symptoms earlier today as you feel these were due to medication taken at home.   Return to the ED immediately for further symptoms of concern should any develop. Do not take any more gabapentin.   Please see your doctor for recheck evaluation in the next week.

## 2023-06-10 NOTE — ED Provider Triage Note (Signed)
Emergency Medicine Provider Triage Evaluation Note  Ronald Reeves , a 67 y.o. male  was evaluated in triage.  Pt complains of AMS.  Review of Systems  Positive: Somnolent, confused, abdominal pain, vomiting, diarrhea Negative: Chest pain, SOB, known fever  Physical Exam  BP (!) 142/71   Pulse 73   Temp 97.6 F (36.4 C)   Resp 16   SpO2 100%  Gen:   Awake, no distress  Srifts off to sleep Resp:  Normal effort Scattered rales, expiratory wheezes MSK:   Moves extremities without difficulty  Other:  Tender to periumbilical abdomen  Medical Decision Making  Medically screening exam initiated at 1:07 PM.  Appropriate orders placed.  Ronald Reeves was informed that the remainder of the evaluation will be completed by another provider, this initial triage assessment does not replace that evaluation, and the importance of remaining in the ED until their evaluation is complete.  Patient missed dialysis yesterday due to vomiting and diarrhea. Went today and was found to be "off", doing things he doesn't normally do like trying to manipulate his dialysis catheter, eating candy. Sent to ED.    Ronald Anis, PA-C 06/10/23 1311

## 2023-06-10 NOTE — ED Provider Notes (Signed)
[place] EMERGENCY DEPARTMENT AT [facility] Provider Note   CSN: [id number] Arrival date & time: 06/10/23  1230     History  Chief Complaint  Patient presents with   Altered Mental Status   Hyperglycemia    Ronald Reeves is a 67 y.o. male.  Patient to ED via EMS from dialysis for confusion and odd behavior. Per staff at dialysis, he was very somnolent, when awake he was manipulating his dialysis catheter causing bleeding which is highly unusual for him. On arrival, he drifts off to sleep and wakes with a jump. When awake he does not say anything clearly. He does report abdominal pain. EMS reports he had vomiting and diarrhea yesterday which caused him to miss dialysis so he went today for a make up treatment.   The history is provided by the patient and the EMS personnel. No language interpreter was used.  Altered Mental Status Hyperglycemia Associated symptoms: altered mental status        Home Medications Prior to Admission medications   Medication Sig Start Date End Date Taking? Authorizing Provider  albuterol (VENTOLIN HFA) 108 (90 Base) MCG/ACT inhaler Inhale 2 puffs into the lungs every 6 (six) hours as needed for wheezing or shortness of breath.    [provider]  amLODipine (NORVASC) 10 MG tablet Take 1 tablet (10 mg total) by mouth daily. 01/01/18   Lewayne Bunting, MD  apixaban (ELIQUIS) 5 MG TABS tablet Take 1 tablet (5 mg total) by mouth 2 (two) times daily. 03/06/23   Atway, Derwood Kaplan, DO  aspirin EC 81 MG tablet Take 81 mg by mouth daily.    [provider]  diclofenac Sodium (VOLTAREN) 1 % GEL Apply 1 Application topically 2 (two) times daily as needed (pain).    [provider]  furosemide (LASIX) 40 MG tablet Take 40 mg by mouth 2 (two) times daily.    [provider]  isosorbide mononitrate (IMDUR) 30 MG 24 hr tablet Take 1 tablet (30 mg total) by mouth daily. 01/01/18   Lewayne Bunting, MD  lidocaine (LIDODERM) 5  % Place 1 patch onto the skin daily as needed (pain). Remove & Discard patch within 12 hours or as directed by MD    [provider]  multivitamin (RENA-VIT) TABS tablet Take 1 tablet by mouth daily.    [provider]  naloxone [facility]) nasal spray 4 mg/0.1 mL Place 0.4 mg into the nose See admin instructions.  SPRAY 1 SPRAY INTO ONE NOSTRIL AS DIRECTED AS NEEDED FOR RESCUE FOR OPIOID OVERDOSE - CALL 911 IMMEDIATELY, ADMINISTER DOSE, THEN TURN PERSON ON SIDE - IF NO RESPONSE IN 2-3 MINUTES OR PERSON RESPONDS BUT RELAPSES, REPEAT USING A NEW SPRAY DEVICE AND SPRAY INTO THE OTHER NOSTRIL AS NEEDED FOR RESCUE 01/21/23   [provider]  omeprazole (PRILOSEC) 40 MG capsule Take 1 capsule (40 mg total) by mouth daily. Patient taking differently: Take 40 mg by mouth 2 (two) times daily as needed. 06/06/22   Arnaldo Natal, NP  sevelamer carbonate (RENVELA) 800 MG tablet Take 1,600 mg by mouth 3 (three) times daily with meals. 01/06/23   [provider]  sorbitol 70 % solution Take 10 mLs by mouth 3 (three) times daily as needed (constipation). 01/20/23   [provider]  tamsulosin (FLOMAX) 0.4 MG CAPS capsule Take 0.4 mg by mouth 2 (two) times daily.    [provider]      Allergies  Gabapentin    Review of Systems   Review of Systems  Physical Exam Updated Vital Signs BP (!) 140/76   Pulse 88   Temp 97.8 F (36.6 C)   Resp 18   SpO2 100%  Physical Exam Vitals and nursing note reviewed.  Constitutional:      General: He is not in acute distress. HENT:     Head: Atraumatic.  Eyes:     Pupils: Pupils are equal, round, and reactive to light.  Neck:     Vascular: No carotid bruit.  Cardiovascular:     Rate and Rhythm: Normal rate and regular rhythm.  Pulmonary:     Effort: Pulmonary effort is normal.     Breath sounds: Rales present.     Comments: + mild expiratory wheezes Abdominal:     General: There is no distension.      Palpations: Abdomen is soft.     Tenderness: There is abdominal tenderness (Periumbilical).  Musculoskeletal:     Cervical back: Normal range of motion and neck supple.  Skin:    General: Skin is warm and dry.  Neurological:     Comments: Does not follow command well. Moves all extremities. Somnolent. No facial asymmetry.      ED Results / Procedures / Treatments   Labs (all labs ordered are listed, but only abnormal results are displayed) Labs Reviewed  CBC WITH DIFFERENTIAL/PLATELET - Abnormal; Notable for the following components:      Result Value   Hemoglobin 12.9 (*)    RDW 16.7 (*)    Platelets 123 (*)    All other components within normal limits  COMPREHENSIVE METABOLIC PANEL - Abnormal; Notable for the following components:   Chloride 93 (*)    BUN 24 (*)    Creatinine, Ser 5.88 (*)    Calcium 8.4 (*)    GFR, Estimated 10 (*)    Anion gap 16 (*)    All other components within normal limits  URINALYSIS, ROUTINE W REFLEX MICROSCOPIC - Abnormal; Notable for the following components:   Glucose, UA 50 (*)    Hgb urine dipstick SMALL (*)    Protein, ur 100 (*)    All other components within normal limits  I-STAT VENOUS BLOOD GAS, ED - Abnormal; Notable for the following components:   pH, Ven 7.445 (*)    pO2, Ven 31 (*)    Bicarbonate 34.8 (*)    TCO2 36 (*)    Acid-Base Excess 9.0 (*)    Calcium, Ion 0.92 (*)    All other components within normal limits  CBG MONITORING, ED - Abnormal; Notable for the following components:   Glucose-Capillary 120 (*)    All other components within normal limits  CBG MONITORING, ED  CBG MONITORING, ED    EKG EKG Interpretation Date/Time:  Wednesday June 10 2023 12:52:31 EDT Ventricular Rate:  72 PR Interval:  176 QRS Duration:  98 QT Interval:  446 QTC Calculation: 488 R Axis:   -32  Text Interpretation: Normal sinus rhythm Biatrial enlargement Left axis deviation Left ventricular hypertrophy ( Sokolow-Lyon ,  Cornell product ) Cannot rule out Septal infarct , age undetermined ST & T wave abnormality, consider lateral ischemia Abnormal ECG When compared with ECG of 21-May-2023 10:32, PREVIOUS ECG IS PRESENT when compared to prior, t wave inversion in lead 4 appears similar to more remote ECG. No STEMI Confirmed by Theda Belfast ([id number]) on 06/10/2023 2:15:05 PM  Radiology CT ABDOMEN PELVIS WO CONTRAST  Result  Date: 06/10/2023 CLINICAL DATA:  Abdominal pain, acute, nonlocalized. EXAM: CT ABDOMEN AND PELVIS WITHOUT CONTRAST TECHNIQUE: Multidetector CT imaging of the abdomen and pelvis was performed following the standard protocol without IV contrast. RADIATION DOSE REDUCTION: This exam was performed according to the departmental dose-optimization program which includes automated exposure control, adjustment of the mA and/or kV according to patient size and/or use of iterative reconstruction technique. COMPARISON:  CT of the abdomen and pelvis without contrast 05/21/2023 FINDINGS: Lower chest: Linear atelectasis or scarring is again noted at the left base. Mild atelectasis is present at right base. Previously seen pleural effusions have resolved. The heart is mildly enlarged. No significant pericardial effusion is present. Hepatobiliary: No focal liver abnormality is seen. No gallstones, gallbladder wall thickening, or biliary dilatation. Pancreas: Unremarkable. No pancreatic ductal dilatation or surrounding inflammatory changes. Spleen: Normal in size without focal abnormality. Adrenals/Urinary Tract: Stone or mass lesion is present. No obstruction is present. Ureters are within normal limits. The urinary bladder is normal. Stomach/Bowel: The stomach is moderately distended no mass lesion present. Small bowel is unremarkable. Colon is within limits. Vascular/Lymphatic: Atherosclerotic calcifications are present the aorta and branch vessels. No significant adenopathy is present. Reproductive: The prostate is enlarged,  measuring 5.3 cm in transverse diameter. Other: No abdominal wall hernia or abnormality. No abdominopelvic ascites. Musculoskeletal: Fusion is present across the disc space at L3-4. Fused anterior disc osteophytes are present at L4-5 and L5-S1 as well as at L1-2. IMPRESSION: 1. No acute or focal lesion to explain the patient's symptoms. 2. Resolution of previously seen pleural effusions. 3. Mild cardiomegaly without failure. 4. Prostatomegaly. 5. Multilevel degenerative disc disease of the lumbar spine. 6.  Aortic Atherosclerosis (ICD10-I70.0). Electronically Signed   By: Marin Roberts M.D.   On: 06/10/2023 16:29   CT Head Wo Contrast  Result Date: 06/10/2023 CLINICAL DATA:  Mental status change, unknown cause EXAM: CT HEAD WITHOUT CONTRAST TECHNIQUE: Contiguous axial images were obtained from the base of the skull through the vertex without intravenous contrast. RADIATION DOSE REDUCTION: This exam was performed according to the departmental dose-optimization program which includes automated exposure control, adjustment of the mA and/or kV according to patient size and/or use of iterative reconstruction technique. COMPARISON:  CT head 03/18/2023. FINDINGS: Brain: No evidence of acute infarction, hemorrhage, hydrocephalus, extra-axial collection or mass lesion/mass effect. Patchy white matter hypodensities are nonspecific but compatible with chronic microvascular ischemic change. Vascular: No hyperdense vessel identified. Calcific atherosclerosis. Skull: No acute fracture. Sinuses/Orbits: Mostly clear sinuses.  No acute orbital findings. Other: No mastoid effusions. IMPRESSION: 1. No evidence of acute intracranial abnormality. 2. Chronic microvascular ischemic disease. Electronically Signed   By: Feliberto Harts M.D.   On: 06/10/2023 16:24    Procedures Procedures    Medications Ordered in ED Medications - No data to display  ED Course/ Medical Decision Making/ A&P                                  Medical Decision Making This patient presents to the ED for concern of ams, this involves an extensive number of treatment options, and is a complaint that carries with it a high risk of complications and morbidity.  The differential diagnosis includes stroke, infection, bleed, electrolyte abnormalities, changes in renal function   Co morbidities that complicate the patient evaluation  Currently altered, ESRD-HD, HTN, CAD, GERD, substance abuse, COPD   Additional history obtained:  Additional history obtained  from wife External records from outside source obtained and reviewed including previous medical records   Lab Tests:  I Ordered, and personally interpreted labs.  The pertinent results include:    Imaging Studies ordered:  I ordered imaging studies including Head CT, abd/pel, CT  Head CT, abd/pel CT - no acute findings per radiology interpretation     Cardiac Monitoring: / EKG:  The patient was maintained on a cardiac monitor.  I personally viewed and interpreted the cardiac monitored which showed an underlying rhythm of: NSR, unchanged    Problem List / ED Course / Critical interventions / Medication management  Altered mental status, resolved on final recheck.  Neg Head CT. MRI discussed, patient declined.  He received full dialysis treatment today. Continue treatments as scheduled.   Social Determinants of Health:  Poor insight into self care   Test / Admission - Considered:  Patient has a negtive head CT. Discussed MRI for definitive diagnosis given his symptoms and to r/o stroke, however, the patient declines. Wife now at bedside who reports she feels his symptoms were due to his having taken gabapentin at home as he has had similar presentation with that medication. He was not supposed to be taking it but wife reports he took it on his own this morning.   The patient is at full baseline. He is ambulatory, awake, oriented, alert and playing games on his  phone. He is adamant he does not want further evaluation. CBG 120 on last check. No evidence infection.   Stable for discharge.      Amount and/or Complexity of Data Reviewed Labs: ordered. Radiology: ordered.           Final Clinical Impression(s) / ED Diagnoses Final diagnoses:  Transient alteration of awareness    Rx / DC Orders ED Discharge Orders     None         Elpidio Anis, PA-C 06/10/23 1955    Tegeler, Canary Brim, MD 06/12/23 [phone]

## 2023-06-10 NOTE — ED Triage Notes (Signed)
Patient BIB [facility] from dialysis after full treatment today patient was reported to being A&Ox4 but behaving odd, craving sugar, picking at his HD catheter, reporting polyuria, hx of pre-diabetes. Patient reports feeling "out of it" and had N/V/D yesterday. BP 150/70, HR 85, RR 18, 99% RA, 20 L forearm. HD access on right arm.

## 2023-06-15 ENCOUNTER — Ambulatory Visit (INDEPENDENT_AMBULATORY_CARE_PROVIDER_SITE_OTHER): Payer: Medicare Other | Admitting: Physician Assistant

## 2023-06-15 ENCOUNTER — Other Ambulatory Visit: Payer: Self-pay

## 2023-06-15 VITALS — BP 156/79 | HR 85 | Temp 98.1°F | Resp 18 | Ht 71.0 in | Wt 151.8 lb

## 2023-06-15 DIAGNOSIS — Z992 Dependence on renal dialysis: Secondary | ICD-10-CM | POA: Diagnosis not present

## 2023-06-15 DIAGNOSIS — N186 End stage renal disease: Secondary | ICD-10-CM

## 2023-06-15 DIAGNOSIS — R58 Hemorrhage, not elsewhere classified: Secondary | ICD-10-CM

## 2023-06-15 MED ORDER — SODIUM CHLORIDE 0.9 % IV SOLN: 250.0000 mL | INTRAVENOUS | Status: AC | PRN

## 2023-06-15 NOTE — Progress Notes (Signed)
    Postoperative Access Visit   History of Present Illness   Ronald Reeves is a 67 y.o. year old male who presents for postoperative follow-up for: right second stage basilic vein transposition (brachiobasilic arteriovenous fistula) placement 02/18/23 by Dr. Chestine Spore. The patient's wounds are well healed.  He reports prolonged bleeding after dialysis. He says usually 1 hour or more after dialysis he has continued bleeding. He has been sent to ER before for it. He does not report any other issues with dialyzing. He does not have any steal symptoms. He was previously using a right femoral TDC but he says this was removed last week. He currently dialyzes on TTS at [facility]   Physical Examination   Vitals:   06/15/23 0820  BP: (!) 156/79  Pulse: 85  Resp: 18  Temp: 98.1 F (36.7 C)  SpO2: 97%  Weight: 151 lb 12.8 oz (68.9 kg)  Height: 5\' 11"  (1.803 m)   Body mass index is 21.17 kg/m.  right arm Incision is well healed, 2+ radial pulse, hand grip is 5/5, sensation in digits is intact, palpable thrill, bruit can be auscultated     Medical Decision Making   Ronald Reeves is a 67 y.o. year old male who presents s/p right second stage basilic vein transposition (brachiobasilic arteriovenous fistula) placement 02/18/23 by Dr. Chestine Spore. He is currently having prolonged bleeding issues after dialysis. No other issues.  Fistula is very pulsatile distally on exam. 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 He is without signs or symptoms of steal syndrome He is on Eliquis which will need to be held for procedure Will arrange for him to have RUE Fistulogram. We will try to get him on the schedule this week since he is having a lot of bleeding. Will also try to work around his dialysis schedule   Graceann Congress, PA-C [facility] Office: [phone]  Clinic MD: Myra Gianotti

## 2023-06-17 ENCOUNTER — Ambulatory Visit (HOSPITAL_COMMUNITY)
Admission: RE | Admit: 2023-06-17 | Discharge: 2023-06-17 | Disposition: A | Discharge status: Home / Self Care | Payer: Medicare Other | Attending: Vascular Surgery | Admitting: Vascular Surgery

## 2023-06-17 ENCOUNTER — Other Ambulatory Visit: Payer: Self-pay

## 2023-06-17 ENCOUNTER — Encounter (HOSPITAL_COMMUNITY)
Admission: RE | Disposition: A | Discharge status: Home / Self Care | Payer: Self-pay | Source: Home / Self Care | Attending: Vascular Surgery

## 2023-06-17 DIAGNOSIS — Z9889 Other specified postprocedural states: Secondary | ICD-10-CM | POA: Diagnosis not present

## 2023-06-17 DIAGNOSIS — T82858A Stenosis of vascular prosthetic devices, implants and grafts, initial encounter: Secondary | ICD-10-CM | POA: Insufficient documentation

## 2023-06-17 DIAGNOSIS — Y841 Kidney dialysis as the cause of abnormal reaction of the patient, or of later complication, without mention of misadventure at the time of the procedure: Secondary | ICD-10-CM | POA: Insufficient documentation

## 2023-06-17 DIAGNOSIS — Z7901 Long term (current) use of anticoagulants: Secondary | ICD-10-CM | POA: Insufficient documentation

## 2023-06-17 DIAGNOSIS — R58 Hemorrhage, not elsewhere classified: Secondary | ICD-10-CM

## 2023-06-17 DIAGNOSIS — N186 End stage renal disease: Secondary | ICD-10-CM | POA: Insufficient documentation

## 2023-06-17 DIAGNOSIS — Z992 Dependence on renal dialysis: Secondary | ICD-10-CM | POA: Diagnosis not present

## 2023-06-17 HISTORY — PX: PERIPHERAL VASCULAR INTERVENTION: CATH118257

## 2023-06-17 HISTORY — PX: A/V FISTULAGRAM: CATH118298

## 2023-06-17 LAB — POCT I-STAT, CHEM 8
BUN: 61 mg/dL — ABNORMAL HIGH (ref 8–23)
Calcium, Ion: 1.09 mmol/L — ABNORMAL LOW (ref 1.15–1.40)
Chloride: 102 mmol/L (ref 98–111)
Creatinine, Ser: 10.1 mg/dL — ABNORMAL HIGH (ref 0.61–1.24)
Glucose, Bld: 120 mg/dL — ABNORMAL HIGH (ref 70–99)
HCT: 35 % — ABNORMAL LOW (ref 39.0–52.0)
Hemoglobin: 11.9 g/dL — ABNORMAL LOW (ref 13.0–17.0)
Potassium: 5.4 mmol/L — ABNORMAL HIGH (ref 3.5–5.1)
Sodium: 138 mmol/L (ref 135–145)
TCO2: 27 mmol/L (ref 22–32)

## 2023-06-17 LAB — GLUCOSE, CAPILLARY: Glucose-Capillary: 120 mg/dL — ABNORMAL HIGH (ref 70–99)

## 2023-06-17 SURGERY — A/V FISTULAGRAM
Anesthesia: LOCAL | Laterality: Right

## 2023-06-17 MED ORDER — LIDOCAINE HCL (PF) 1 % IJ SOLN
INTRAMUSCULAR | Status: DC | PRN
  Administered 2023-06-17: 2 mL

## 2023-06-17 MED ORDER — HEPARIN SODIUM (PORCINE) 1000 UNIT/ML IJ SOLN
INTRAMUSCULAR | Status: DC | PRN
  Administered 2023-06-17: 3000 [IU] via INTRAVENOUS

## 2023-06-17 MED ORDER — MIDAZOLAM HCL 2 MG/2ML IJ SOLN
INTRAMUSCULAR | Status: AC
  Filled 2023-06-17: qty 2

## 2023-06-17 MED ORDER — DIPHENHYDRAMINE HCL 50 MG/ML IJ SOLN
25.0000 mg | INTRAMUSCULAR | Status: AC
  Administered 2023-06-17: 25 mg via INTRAVENOUS
  Filled 2023-06-17: qty 1

## 2023-06-17 MED ORDER — SODIUM CHLORIDE 0.9% FLUSH: 3.0000 mL | Freq: Two times a day (BID) | INTRAVENOUS | Status: DC

## 2023-06-17 MED ORDER — METHYLPREDNISOLONE SODIUM SUCC 125 MG IJ SOLR
125.0000 mg | INTRAMUSCULAR | Status: AC
  Administered 2023-06-17: 125 mg via INTRAVENOUS
  Filled 2023-06-17: qty 2

## 2023-06-17 MED ORDER — FENTANYL CITRATE (PF) 100 MCG/2ML IJ SOLN
INTRAMUSCULAR | Status: DC | PRN
  Administered 2023-06-17: 50 ug via INTRAVENOUS

## 2023-06-17 MED ORDER — FENTANYL CITRATE (PF) 100 MCG/2ML IJ SOLN
INTRAMUSCULAR | Status: AC
  Filled 2023-06-17: qty 2

## 2023-06-17 MED ORDER — IODIXANOL 320 MG/ML IV SOLN
INTRAVENOUS | Status: DC | PRN
  Administered 2023-06-17: 30 mL

## 2023-06-17 MED ORDER — MIDAZOLAM HCL 2 MG/2ML IJ SOLN
INTRAMUSCULAR | Status: DC | PRN
  Administered 2023-06-17: 1 mg via INTRAVENOUS

## 2023-06-17 MED ORDER — HEPARIN SODIUM (PORCINE) 1000 UNIT/ML IJ SOLN
INTRAMUSCULAR | Status: AC
  Filled 2023-06-17: qty 10

## 2023-06-17 MED ORDER — HEPARIN (PORCINE) IN NACL 1000-0.9 UT/500ML-% IV SOLN
INTRAVENOUS | Status: DC | PRN
  Administered 2023-06-17: 500 mL

## 2023-06-17 MED ORDER — SODIUM CHLORIDE 0.9% FLUSH
3.0000 mL | INTRAVENOUS | Status: DC | PRN
Start: 2023-06-17 — End: 2023-06-17

## 2023-06-17 MED ORDER — LIDOCAINE HCL (PF) 1 % IJ SOLN
INTRAMUSCULAR | Status: AC
  Filled 2023-06-17: qty 30

## 2023-06-17 SURGICAL SUPPLY — 15 items
BALLN IN.PACT DCB 6X80 (BALLOONS) ×4
BALLN MUSTANG 6X80X75 (BALLOONS) ×2
BALLOON MUSTANG 6X80X75 (BALLOONS) IMPLANT
COVER DOME SNAP 22 D (MISCELLANEOUS) ×2 IMPLANT
DCB IN.PACT 6X80 (BALLOONS) IMPLANT
KIT ENCORE 26 ADVANTAGE (KITS) IMPLANT
KIT MICROPUNCTURE NIT STIFF (SHEATH) IMPLANT
SET ATX-X65L (MISCELLANEOUS) IMPLANT
SHEATH PINNACLE R/O II 6F 4CM (SHEATH) IMPLANT
SHEATH PROBE COVER 6X72 (BAG) ×2 IMPLANT
STOPCOCK MORSE 400PSI 3WAY (MISCELLANEOUS) ×2 IMPLANT
TRAY PV CATH (CUSTOM PROCEDURE TRAY) ×2 IMPLANT
TUBING CIL FLEX 10 FLL-RA (TUBING) ×2 IMPLANT
WIRE BENTSON .035X145CM (WIRE) IMPLANT
WIRE STARTER ROSEN .035X260 (WIRE) IMPLANT

## 2023-06-17 NOTE — H&P (Signed)
     Patient seen and examined in preop holding.  No complaints. No changes to medication history or physical exam since last seen in clinic. After discussing the risks and benefits of right arm fistulagram due to prolonged bleeding, Ronald Reeves elected to proceed.   Ronald Sparrow MD  Postoperative Access Visit   History of Present Illness   Ronald Reeves is a 67 y.o. year old male who presents for postoperative follow-up for: right second stage basilic vein transposition (brachiobasilic arteriovenous fistula) placement 02/18/23 by Dr. Chestine Spore. The patient's wounds are well healed.  He reports prolonged bleeding after dialysis. He says usually 1 hour or more after dialysis he has continued bleeding. He has been sent to ER before for it. He does not report any other issues with dialyzing. He does not have any steal symptoms. He was previously using a right femoral TDC but he says this was removed last week. He currently dialyzes on TTS at [facility]   Physical Examination   Vitals:   06/17/23 0824 06/17/23 1101  BP: 119/71   Pulse: 65   Resp: 17   Temp: 97.8 F (36.6 C)   TempSrc: Oral   SpO2: 96% 97%  Weight: 70.8 kg   Height: 5\' 11"  (1.803 m)    Body mass index is 21.76 kg/m.  right arm Incision is well healed, 2+ radial pulse, hand grip is 5/5, sensation in digits is intact, palpable thrill, bruit can be auscultated     Medical Decision Making   Ronald Reeves is a 66 y.o. year old male who presents s/p right second stage basilic vein transposition (brachiobasilic arteriovenous fistula) placement 02/18/23 by Dr. Chestine Spore. He is currently having prolonged bleeding issues after dialysis. No other issues.  Fistula is very pulsatile distally on exam. 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 He is without signs or symptoms of steal syndrome He is on Eliquis which will need to be held for procedure Will arrange for him to have RUE Fistulogram. We  will try to get him on the schedule this week since he is having a lot of bleeding. Will also try to work around his dialysis schedule   Ronald Sparrow, PA-C [facility] [place] Office: [phone]  Clinic MD: Myra Gianotti

## 2023-06-17 NOTE — Op Note (Signed)
    Patient name: Ronald Reeves MRN: [id number] DOB: 10-26-1955 Sex: male  06/17/2023 Pre-operative Diagnosis: Right brachiobasilic fistula malfunction Post-operative diagnosis:  Same Surgeon:  Victorino Sparrow, MD Procedure Performed: 1.  Micropuncture access right brachiobasilic fistula 2.  Fistulogram 3.  Drug-coated balloon venoplasty 6 x 80, 6 x 80 mm mid fistula 4.  Moderate sedation time 23 minutes, contrast volume 30 minutes 5.  Access managed with Monocryl suture   Indications: Patient is a 67 year old male with end-stage renal disease currently being dialyzed through a right-sided brachiobasilic fistula.  The fistula has had prolonged bleeding at decannulation lasting up to 1 hour.  After discussing the risks and benefits of right upper extremity fistulogram in an effort to reduce risk of spontaneous hemorrhage, [name] elected to proceed.  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  The patient was then placed supine on the table and prepped and draped in the usual sterile fashion.  A time out was called.   Case began with access of the right right basilic fistula in antegrade fashion.  A micropuncture sheath was placed and fistulogram followed.  I elected to attempt intervention on the 15 cm tapering stenosis.  A 6 French sheath was placed, and a 6 mm x 80 mm balloon was brought onto the field and inflated.  There was significant wasting which resolved with 3-minute inflation.  The balloon was then moved more centrally and reinflated with again significant wasting that resolved with 3-minute inflation.  I elected to then moved to drug-coated balloon venoplasty.  I was happy with the 6 mm size, therefore a 6 mm x 80 mm drug-coated balloon was brought into the field and inflated to a size of 6-1/2 mm for 3  minutes.  This was then removed and a new balloon brought onto the field and moved more centrally, again inflated to 6-1/2 mm for 3 seconds.  Follow-up fistulogram demonstrated an excellent result with resolution of flow-limiting stenosis through the fistula.  I did not intervene on the 50% stenosis appreciated in the subclavian vein at costochondral junction. Access managed with Monocryl suture.  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  Using 6 x 80 mm x 2 drug-coated balloon. Thrill in fistula case completion.     Victorino Sparrow MD [facility] Office: [phone]

## 2023-06-18 ENCOUNTER — Encounter (HOSPITAL_COMMUNITY): Payer: Self-pay | Admitting: Vascular Surgery

## 2023-07-07 DIAGNOSIS — F17210 Nicotine dependence, cigarettes, uncomplicated: Secondary | ICD-10-CM | POA: Diagnosis not present

## 2023-07-07 DIAGNOSIS — J449 Chronic obstructive pulmonary disease, unspecified: Secondary | ICD-10-CM | POA: Diagnosis not present

## 2023-07-08 ENCOUNTER — Emergency Department (HOSPITAL_COMMUNITY): Payer: No Typology Code available for payment source

## 2023-07-08 ENCOUNTER — Other Ambulatory Visit: Payer: Self-pay

## 2023-07-08 ENCOUNTER — Inpatient Hospital Stay (HOSPITAL_COMMUNITY)
Admission: EM | Admit: 2023-07-08 | Discharge: 2023-07-08 | DRG: 291 | Discharge status: Against Medical Advice | Payer: No Typology Code available for payment source | Attending: Pulmonary Disease | Admitting: Pulmonary Disease

## 2023-07-08 DIAGNOSIS — Y832 Surgical operation with anastomosis, bypass or graft as the cause of abnormal reaction of the patient, or of later complication, without mention of misadventure at the time of the procedure: Secondary | ICD-10-CM | POA: Diagnosis not present

## 2023-07-08 DIAGNOSIS — Z91041 Radiographic dye allergy status: Secondary | ICD-10-CM

## 2023-07-08 DIAGNOSIS — J449 Chronic obstructive pulmonary disease, unspecified: Secondary | ICD-10-CM | POA: Diagnosis present

## 2023-07-08 DIAGNOSIS — Z992 Dependence on renal dialysis: Secondary | ICD-10-CM | POA: Diagnosis not present

## 2023-07-08 DIAGNOSIS — E8770 Fluid overload, unspecified: Secondary | ICD-10-CM | POA: Insufficient documentation

## 2023-07-08 DIAGNOSIS — J810 Acute pulmonary edema: Secondary | ICD-10-CM

## 2023-07-08 DIAGNOSIS — E875 Hyperkalemia: Secondary | ICD-10-CM | POA: Diagnosis present

## 2023-07-08 DIAGNOSIS — Z8249 Family history of ischemic heart disease and other diseases of the circulatory system: Secondary | ICD-10-CM

## 2023-07-08 DIAGNOSIS — M898X9 Other specified disorders of bone, unspecified site: Secondary | ICD-10-CM | POA: Diagnosis present

## 2023-07-08 DIAGNOSIS — Z91158 Patient's noncompliance with renal dialysis for other reason: Secondary | ICD-10-CM

## 2023-07-08 DIAGNOSIS — T82591A Other mechanical complication of surgically created arteriovenous shunt, initial encounter: Secondary | ICD-10-CM | POA: Diagnosis not present

## 2023-07-08 DIAGNOSIS — N2581 Secondary hyperparathyroidism of renal origin: Secondary | ICD-10-CM | POA: Diagnosis present

## 2023-07-08 DIAGNOSIS — I252 Old myocardial infarction: Secondary | ICD-10-CM | POA: Diagnosis not present

## 2023-07-08 DIAGNOSIS — E785 Hyperlipidemia, unspecified: Secondary | ICD-10-CM | POA: Diagnosis present

## 2023-07-08 DIAGNOSIS — F17290 Nicotine dependence, other tobacco product, uncomplicated: Secondary | ICD-10-CM | POA: Diagnosis present

## 2023-07-08 DIAGNOSIS — Z86718 Personal history of other venous thrombosis and embolism: Secondary | ICD-10-CM

## 2023-07-08 DIAGNOSIS — I5023 Acute on chronic systolic (congestive) heart failure: Secondary | ICD-10-CM | POA: Diagnosis present

## 2023-07-08 DIAGNOSIS — Z7901 Long term (current) use of anticoagulants: Secondary | ICD-10-CM | POA: Diagnosis not present

## 2023-07-08 DIAGNOSIS — R7303 Prediabetes: Secondary | ICD-10-CM | POA: Diagnosis present

## 2023-07-08 DIAGNOSIS — Z8616 Personal history of COVID-19: Secondary | ICD-10-CM

## 2023-07-08 DIAGNOSIS — I2510 Atherosclerotic heart disease of native coronary artery without angina pectoris: Secondary | ICD-10-CM | POA: Diagnosis present

## 2023-07-08 DIAGNOSIS — N186 End stage renal disease: Secondary | ICD-10-CM | POA: Diagnosis present

## 2023-07-08 DIAGNOSIS — Z888 Allergy status to other drugs, medicaments and biological substances status: Secondary | ICD-10-CM

## 2023-07-08 DIAGNOSIS — J9601 Acute respiratory failure with hypoxia: Principal | ICD-10-CM | POA: Diagnosis present

## 2023-07-08 DIAGNOSIS — Z7982 Long term (current) use of aspirin: Secondary | ICD-10-CM

## 2023-07-08 DIAGNOSIS — Z79899 Other long term (current) drug therapy: Secondary | ICD-10-CM

## 2023-07-08 DIAGNOSIS — I132 Hypertensive heart and chronic kidney disease with heart failure and with stage 5 chronic kidney disease, or end stage renal disease: Secondary | ICD-10-CM | POA: Diagnosis present

## 2023-07-08 DIAGNOSIS — D631 Anemia in chronic kidney disease: Secondary | ICD-10-CM | POA: Diagnosis present

## 2023-07-08 DIAGNOSIS — I2489 Other forms of acute ischemic heart disease: Secondary | ICD-10-CM | POA: Diagnosis present

## 2023-07-08 DIAGNOSIS — F1410 Cocaine abuse, uncomplicated: Secondary | ICD-10-CM | POA: Diagnosis present

## 2023-07-08 LAB — CBC WITH DIFFERENTIAL/PLATELET
Abs Immature Granulocytes: 0.02 10*3/uL (ref 0.00–0.07)
Basophils Absolute: 0.1 10*3/uL (ref 0.0–0.1)
Basophils Relative: 1 %
Eosinophils Absolute: 0.5 10*3/uL (ref 0.0–0.5)
Eosinophils Relative: 8 %
HCT: 38.8 % — ABNORMAL LOW (ref 39.0–52.0)
Hemoglobin: 12.4 g/dL — ABNORMAL LOW (ref 13.0–17.0)
Immature Granulocytes: 0 %
Lymphocytes Relative: 18 %
Lymphs Abs: 1.1 10*3/uL (ref 0.7–4.0)
MCH: 27.7 pg (ref 26.0–34.0)
MCHC: 32 g/dL (ref 30.0–36.0)
MCV: 86.8 fL (ref 80.0–100.0)
Monocytes Absolute: 0.5 10*3/uL (ref 0.1–1.0)
Monocytes Relative: 9 %
Neutro Abs: 3.7 10*3/uL (ref 1.7–7.7)
Neutrophils Relative %: 64 %
Platelets: 169 10*3/uL (ref 150–400)
RBC: 4.47 MIL/uL (ref 4.22–5.81)
RDW: 16.5 % — ABNORMAL HIGH (ref 11.5–15.5)
WBC: 5.8 10*3/uL (ref 4.0–10.5)
nRBC: 0 % (ref 0.0–0.2)

## 2023-07-08 LAB — HEPATITIS B SURFACE ANTIGEN: Hepatitis B Surface Ag: NONREACTIVE

## 2023-07-08 LAB — I-STAT CHEM 8, ED
BUN: 58 mg/dL — ABNORMAL HIGH (ref 8–23)
Calcium, Ion: 1.18 mmol/L (ref 1.15–1.40)
Chloride: 106 mmol/L (ref 98–111)
Creatinine, Ser: 9.5 mg/dL — ABNORMAL HIGH (ref 0.61–1.24)
Glucose, Bld: 111 mg/dL — ABNORMAL HIGH (ref 70–99)
HCT: 42 % (ref 39.0–52.0)
Hemoglobin: 14.3 g/dL (ref 13.0–17.0)
Potassium: 5 mmol/L (ref 3.5–5.1)
Sodium: 140 mmol/L (ref 135–145)
TCO2: 23 mmol/L (ref 22–32)

## 2023-07-08 LAB — COMPREHENSIVE METABOLIC PANEL
ALT: 17 U/L (ref 0–44)
AST: 21 U/L (ref 15–41)
Albumin: 3.5 g/dL (ref 3.5–5.0)
Alkaline Phosphatase: 95 U/L (ref 38–126)
Anion gap: 15 (ref 5–15)
BUN: 66 mg/dL — ABNORMAL HIGH (ref 8–23)
CO2: 20 mmol/L — ABNORMAL LOW (ref 22–32)
Calcium: 9.6 mg/dL (ref 8.9–10.3)
Chloride: 105 mmol/L (ref 98–111)
Creatinine, Ser: 9.39 mg/dL — ABNORMAL HIGH (ref 0.61–1.24)
GFR, Estimated: 6 mL/min — ABNORMAL LOW (ref >60)
Glucose, Bld: 116 mg/dL — ABNORMAL HIGH (ref 70–99)
Potassium: 5.2 mmol/L — ABNORMAL HIGH (ref 3.5–5.1)
Sodium: 140 mmol/L (ref 135–145)
Total Bilirubin: 0.7 mg/dL (ref <1.2)
Total Protein: 7.4 g/dL (ref 6.5–8.1)

## 2023-07-08 LAB — TROPONIN I (HIGH SENSITIVITY)
Troponin I (High Sensitivity): 88 ng/L — ABNORMAL HIGH (ref <18)
Troponin I (High Sensitivity): 77 ng/L — ABNORMAL HIGH (ref <18)

## 2023-07-08 LAB — MRSA NEXT GEN BY PCR, NASAL: MRSA by PCR Next Gen: NOT DETECTED

## 2023-07-08 LAB — BRAIN NATRIURETIC PEPTIDE: B Natriuretic Peptide: >4500 pg/mL — ABNORMAL HIGH (ref 0.0–100.0)

## 2023-07-08 LAB — GLUCOSE, CAPILLARY: Glucose-Capillary: 109 mg/dL — ABNORMAL HIGH (ref 70–99)

## 2023-07-08 MED ORDER — ROSUVASTATIN CALCIUM 5 MG PO TABS: 10.0000 mg | ORAL_TABLET | Freq: Every day | ORAL | Status: DC

## 2023-07-08 MED ORDER — DOCUSATE SODIUM 100 MG PO CAPS: 100.0000 mg | ORAL_CAPSULE | Freq: Two times a day (BID) | ORAL | Status: DC | PRN

## 2023-07-08 MED ORDER — FUROSEMIDE 10 MG/ML IJ SOLN
40.0000 mg | Freq: Once | INTRAMUSCULAR | Status: AC
  Administered 2023-07-08: 40 mg via INTRAVENOUS
  Filled 2023-07-08: qty 4

## 2023-07-08 MED ORDER — LIDOCAINE HCL (PF) 1 % IJ SOLN: 5.0000 mL | INTRAMUSCULAR | Status: DC | PRN

## 2023-07-08 MED ORDER — FUROSEMIDE 10 MG/ML IJ SOLN
120.0000 mg | Freq: Once | INTRAVENOUS | Status: DC
  Filled 2023-07-08: qty 12

## 2023-07-08 MED ORDER — LIDOCAINE HCL (PF) 1 % IJ SOLN
INTRAMUSCULAR | Status: AC
  Administered 2023-07-08: 5 mL
  Filled 2023-07-08: qty 5

## 2023-07-08 MED ORDER — POLYETHYLENE GLYCOL 3350 17 G PO PACK: 17.0000 g | PACK | Freq: Every day | ORAL | Status: DC | PRN

## 2023-07-08 MED ORDER — HYDRALAZINE HCL 25 MG PO TABS: 25.0000 mg | ORAL_TABLET | Freq: Two times a day (BID) | ORAL | Status: DC

## 2023-07-08 MED ORDER — HEPARIN SODIUM (PORCINE) 5000 UNIT/ML IJ SOLN: 5000.0000 [IU] | Freq: Three times a day (TID) | INTRAMUSCULAR | Status: DC

## 2023-07-08 MED ORDER — AMLODIPINE BESYLATE 10 MG PO TABS: 10.0000 mg | ORAL_TABLET | Freq: Every day | ORAL | Status: DC

## 2023-07-08 MED ORDER — ALTEPLASE 2 MG IJ SOLR: 2.0000 mg | Freq: Once | INTRAMUSCULAR | Status: DC | PRN

## 2023-07-08 MED ORDER — PANTOPRAZOLE SODIUM 40 MG PO TBEC: 40.0000 mg | DELAYED_RELEASE_TABLET | Freq: Every day | ORAL | Status: DC

## 2023-07-08 MED ORDER — ISOSORBIDE MONONITRATE ER 30 MG PO TB24: 30.0000 mg | ORAL_TABLET | Freq: Every day | ORAL | Status: DC

## 2023-07-08 MED ORDER — ALBUTEROL SULFATE (2.5 MG/3ML) 0.083% IN NEBU: 2.5000 mg | INHALATION_SOLUTION | Freq: Four times a day (QID) | RESPIRATORY_TRACT | Status: DC | PRN

## 2023-07-08 MED ORDER — HEPARIN SODIUM (PORCINE) 1000 UNIT/ML DIALYSIS: 1000.0000 [IU] | INTRAMUSCULAR | Status: DC | PRN

## 2023-07-08 MED ORDER — CHLORHEXIDINE GLUCONATE CLOTH 2 % EX PADS
6.0000 | MEDICATED_PAD | Freq: Every day | CUTANEOUS | Status: DC
  Administered 2023-07-08: 6 via TOPICAL

## 2023-07-08 MED ORDER — PENTAFLUOROPROP-TETRAFLUOROETH EX AERO: 1.0000 | INHALATION_SPRAY | CUTANEOUS | Status: DC | PRN

## 2023-07-08 MED ORDER — HEPARIN SODIUM (PORCINE) 1000 UNIT/ML IJ SOLN
INTRAMUSCULAR | Status: AC
  Filled 2023-07-08: qty 3

## 2023-07-08 MED ORDER — LABETALOL HCL 100 MG PO TABS
100.0000 mg | ORAL_TABLET | Freq: Two times a day (BID) | ORAL | Status: DC
  Filled 2023-07-08: qty 1

## 2023-07-08 MED ORDER — ANTICOAGULANT SODIUM CITRATE 4% (200MG/5ML) IV SOLN: 5.0000 mL | Status: DC | PRN

## 2023-07-08 MED ORDER — BUMETANIDE 2 MG PO TABS: 4.0000 mg | ORAL_TABLET | Freq: Every day | ORAL | Status: DC

## 2023-07-08 MED ORDER — CHLORHEXIDINE GLUCONATE CLOTH 2 % EX PADS: 6.0000 | MEDICATED_PAD | Freq: Every day | CUTANEOUS | Status: DC

## 2023-07-08 MED ORDER — ALBUTEROL SULFATE HFA 108 (90 BASE) MCG/ACT IN AERS: 2.0000 | INHALATION_SPRAY | Freq: Four times a day (QID) | RESPIRATORY_TRACT | Status: DC | PRN

## 2023-07-08 MED ORDER — LIDOCAINE HCL (PF) 1 % IJ SOLN: 5.0000 mL | Freq: Once | INTRAMUSCULAR | Status: AC

## 2023-07-08 MED ORDER — ACETAMINOPHEN 325 MG PO TABS: 650.0000 mg | ORAL_TABLET | Freq: Four times a day (QID) | ORAL | Status: DC | PRN

## 2023-07-08 MED ORDER — LIDOCAINE-PRILOCAINE 2.5-2.5 % EX CREA: 1.0000 | TOPICAL_CREAM | CUTANEOUS | Status: DC | PRN

## 2023-07-08 NOTE — Progress Notes (Signed)
RT transported pt on bipap from ED 42 to ED 35 without any complications. RN at bedside.

## 2023-07-08 NOTE — Progress Notes (Signed)
Placed pt. On bipap per MD. Pt. Is tolerating well at this time.

## 2023-07-08 NOTE — ED Triage Notes (Signed)
BIB EMS - SOB x2 days. Followed by pulmonologist, has PRN albuterol inhaler. Used inhaler 8-10x PTA with no relief. Hx of COPD and Dialysis. 91% on RA, placed on 4L [place] = 100% Coughing up yellow phelgm. Also states recent hx of PNA.

## 2023-07-08 NOTE — Progress Notes (Signed)
C/o SOB, pulse ox 97% on RA. O2 applied via n/c @ 2 lpm for comfort.

## 2023-07-08 NOTE — ED Notes (Signed)
Vascular provider at bedside

## 2023-07-08 NOTE — H&P (Addendum)
Date: 07/08/2023               Patient Name:  Ronald Reeves MRN: [id number]  DOB: Feb 10, 1956 Age / Sex: 67 y.o., male   PCP: Benjiman Core, MD         Medical Service: Internal Medicine Teaching Service         Attending Physician: Dr. Lynnell Catalan, MD      First Contact: Dr. Laretta Bolster, MD Pager [phone]    Second Contact: Dr. Modena Slater, DO Pager [phone]         After Hours (After 5p/  First Contact Pager: [phone]  weekends / holidays): Second Contact Pager: [phone]   SUBJECTIVE   Chief Complaint: HD access  History of Present Illness:  [name] is a 67 year old with PMH of ESRD on HD, heart failure with reduced ejection fraction (EF 30-35%), VTE in June on Eliquis, presenting with 2-week history of progressive dyspnea.  Patient reports that last HD session was on Saturday.  With the holiday schedule he went to dialysis on Monday, however there was a power problem at the clinic and he could not receive HD that day.  He was rescheduled for Tuesday, but missed appointment as he also had a pulmonology appointment and prioritize going there.  For the last 2 weeks he has had progressive dyspnea, acutely worsening in the last 2 days.  At baseline he is dependent on his wife for almost all ADLs outside of eating and toileting.  In the last 2 days he has not been able to complete these tasks.   He endorsed chest tightness, denied chest pain.  ED Course: 140-150/90s  Brought in by EMS.  Had BNP elevated greater than 4000.  Imaging was consistent with fluid overload.  Troponins mildly elevated consistent with demand ischemia.  He was taken to HD, however fistula clotted.  Vascular surgery did not have an OR time to help with TDC.  ICU assumed care as patient needed line placement and he had significant tachypnea while on BiPAP.  Past Medical History ESRD on dialysis Tuesday Thursday Saturday Heart failure with reduced ejection fraction Hyperlipidemia Provoked DVT in  June, on anticoagulation   Past Surgical History:  Procedure Laterality Date   A/V FISTULAGRAM Right 06/17/2023   Procedure: A/V Fistulagram;  Surgeon: Victorino Sparrow, MD;  Location: [facility] INVASIVE CV LAB;  Service: Cardiovascular;  Laterality: Right;   AV FISTULA PLACEMENT Left 04/12/2021   Procedure: LEFT ARM ARTERIOVENOUS (AV) FISTULA CREATION;  Surgeon: Cephus Shelling, MD;  Location: [facility] OR;  Service: Vascular;  Laterality: Left;   AV FISTULA PLACEMENT Left 10/18/2021   Procedure: LEFT ARM BRACHIOBASILIC FISTULA CREATION FIRST STAGE;  Surgeon: Cephus Shelling, MD;  Location: [facility] OR;  Service: Vascular;  Laterality: Left;   AV FISTULA PLACEMENT Right 11/28/2022   Procedure: RIGHT ARM FIRST STAGE BRACHIOBASILIC FISTULA CREATION;  Surgeon: Cephus Shelling, MD;  Location: [facility] OR;  Service: Vascular;  Laterality: Right;   BACK SURGERY     BASCILIC VEIN TRANSPOSITION Left 01/27/2022   Procedure: LEFT SECOND STAGE BASILIC VEIN TRANSPOSITION;  Surgeon: Cephus Shelling, MD;  Location: [facility] OR;  Service: Vascular;  Laterality: Left;   BASCILIC VEIN TRANSPOSITION Right 02/18/2023   Procedure: SECOND STAGE RIGHT ARM BASILIC VEIN TRANSPOSITION;  Surgeon: Cephus Shelling, MD;  Location: [facility] OR;  Service: Vascular;  Laterality: Right;   BIOPSY  08/20/2022   Procedure: BIOPSY;  Surgeon: Shellia Cleverly, DO;  Location: [facility]  ENDOSCOPY;  Service: Gastroenterology;;   COLONOSCOPY WITH PROPOFOL N/A 08/20/2022   Procedure: COLONOSCOPY WITH PROPOFOL;  Surgeon: Shellia Cleverly, DO;  Location: [facility] ENDOSCOPY;  Service: Gastroenterology;  Laterality: N/A;   DIALYSIS/PERMA CATHETER INSERTION Right 03/05/2023   Procedure: DIALYSIS/PERMA CATHETER INSERTION;  Surgeon: Cephus Shelling, MD;  Location: [facility] INVASIVE CV LAB;  Service: Cardiovascular;  Laterality: Right;   ESOPHAGOGASTRODUODENOSCOPY (EGD) WITH PROPOFOL N/A 08/20/2022   Procedure: ESOPHAGOGASTRODUODENOSCOPY (EGD) WITH PROPOFOL;  Surgeon:  Shellia Cleverly, DO;  Location: [facility] ENDOSCOPY;  Service: Gastroenterology;  Laterality: N/A;   INSERTION OF DIALYSIS CATHETER Right 05/01/2022   Procedure: INSERTION OF DIALYSIS CATHETER;  Surgeon: [name], MD;  Location: [facility] OR;  Service: Vascular;  Laterality: Right;   IR REMOVAL TUN CV CATH W/O FL  12/19/2022   LIGATION OF COMPETING BRANCHES OF ARTERIOVENOUS FISTULA Left 07/15/2021   Procedure: LIGATION OF COMPETING BRANCHES OF LEFT RADIOCEPHALIC ARTERIOVENOUS FISTULA TIMES TWO;  Surgeon: Cephus Shelling, MD;  Location: [facility] OR;  Service: Vascular;  Laterality: Left;   PERIPHERAL VASCULAR INTERVENTION  06/17/2023   Procedure: PERIPHERAL VASCULAR INTERVENTION;  Surgeon: Victorino Sparrow, MD;  Location: [facility] INVASIVE CV LAB;  Service: Cardiovascular;;   POLYPECTOMY  08/20/2022   Procedure: POLYPECTOMY;  Surgeon: Shellia Cleverly, DO;  Location: [facility] ENDOSCOPY;  Service: Gastroenterology;;   REVISON OF ARTERIOVENOUS FISTULA Left 07/15/2021   Procedure: REVISON OF LEFT ARTERIOVENOUS FISTULA;  Surgeon: Cephus Shelling, MD;  Location: [facility] OR;  Service: Vascular;  Laterality: Left;   REVISON OF ARTERIOVENOUS FISTULA Left 05/01/2022   Procedure: ARTERIOVENOUS FISTULA WASHOUT OF ARM HEMATOMA;  Surgeon: [name], MD;  Location: [facility] OR;  Service: Vascular;  Laterality: Left;    Meds:  Current Meds  Medication Sig   albuterol (VENTOLIN HFA) 108 (90 Base) MCG/ACT inhaler Inhale 2 puffs into the lungs every 6 (six) hours as needed for wheezing or shortness of breath.   amLODipine (NORVASC) 10 MG tablet Take 1 tablet (10 mg total) by mouth daily.   apixaban (ELIQUIS) 5 MG TABS tablet Take 1 tablet (5 mg total) by mouth 2 (two) times daily.   B Complex-C-Folic Acid (DIALYVITE TABLET) TABS Take 1 tablet by mouth daily.   furosemide (LASIX) 40 MG tablet Take 40 mg by mouth daily.   hydrALAZINE (APRESOLINE) 25 MG tablet Take 25 mg by mouth 2 (two) times daily.   isosorbide mononitrate (IMDUR)  30 MG 24 hr tablet Take 1 tablet (30 mg total) by mouth daily.   labetalol (NORMODYNE) 100 MG tablet Take 100 mg by mouth 2 (two) times daily.   lidocaine (LIDODERM) 5 % Place 1 patch onto the skin daily as needed (pain). Remove & Discard patch within 12 hours or as directed by MD   mirtazapine (REMERON) 15 MG tablet Take 7.5-15 mg by mouth at bedtime as needed (Sleep).   naloxone (NARCAN) nasal spray 4 mg/0.1 mL Place 0.4 mg into the nose See admin instructions.  SPRAY 1 SPRAY INTO ONE NOSTRIL AS DIRECTED AS NEEDED FOR RESCUE FOR OPIOID OVERDOSE - CALL 911 IMMEDIATELY, ADMINISTER DOSE, THEN TURN PERSON ON SIDE - IF NO RESPONSE IN 2-3 MINUTES OR PERSON RESPONDS BUT RELAPSES, REPEAT USING A NEW SPRAY DEVICE AND SPRAY INTO THE OTHER NOSTRIL AS NEEDED FOR RESCUE   omeprazole (PRILOSEC) 40 MG capsule Take 1 capsule (40 mg total) by mouth daily.   rosuvastatin (CRESTOR) 10 MG tablet Take 10 mg by mouth daily.   sevelamer carbonate (RENVELA) 800  MG tablet Take 1,600 mg by mouth 3 (three) times daily with meals.   sorbitol 70 % solution Take 10 mLs by mouth 3 (three) times daily as needed (constipation).   tamsulosin (FLOMAX) 0.4 MG CAPS capsule Take 0.8 mg by mouth 2 (two) times daily.   VITAMIN D, ERGOCALCIFEROL, PO Take 1 tablet by mouth daily.    Social:  Lives With: wife, [name] Occupation: disabled, worked as Curator Support: family including grandchildren Level of Function: needs help with dressing and bathing. Able to feed himself [facility] Substances: smokes 1-2 Black and Milds daily, prior to that smoked 0.5 cigarettes daily for >20 years, denies alcohol use in last 6 months  Allergies: Allergies as of 07/08/2023 - Review Complete 07/08/2023  Allergen Reaction Noted   Gabapentin  03/19/2023   Iodinated contrast media Nausea And Vomiting 06/15/2023    Review of Systems: A complete ROS was negative except as per HPI.   OBJECTIVE:   Physical Exam: Blood pressure (!) 152/92, pulse 91,  temperature 98.2 F (36.8 C), resp. rate (!) 28, height 5\' 11"  (1.803 m), weight 72.6 kg, SpO2 99%.  Constitutional: chronically ill appearing, in no acute distress Cardiovascular: regular rate and rhythm, no m/r/g Pulmonary/Chest: tachypnea, able to speak in full sentences, sitting with bed in most elevated position, intermittently stands to locate items in room Abdominal: soft, non-tender, non-distended MSK: normal bulk and tone Neurological: alert & oriented x 3 Skin: warm and dry Psych: Frustrated  Labs: CBC    Component Value Date/Time   WBC 5.8 07/08/2023 0317   RBC 4.47 07/08/2023 0317   HGB 14.3 07/08/2023 0358   HCT 42.0 07/08/2023 0358   PLT 169 07/08/2023 0317   MCV 86.8 07/08/2023 0317   MCH 27.7 07/08/2023 0317   MCHC 32.0 07/08/2023 0317   RDW 16.5 (H) 07/08/2023 0317   LYMPHSABS 1.1 07/08/2023 0317   MONOABS 0.5 07/08/2023 0317   EOSABS 0.5 07/08/2023 0317   BASOSABS 0.1 07/08/2023 0317     CMP     Component Value Date/Time   NA 140 07/08/2023 0358   NA 140 09/22/2017 0856   K 5.0 07/08/2023 0358   CL 106 07/08/2023 0358   CO2 20 (L) 07/08/2023 0317   GLUCOSE 111 (H) 07/08/2023 0358   BUN 58 (H) 07/08/2023 0358   BUN 12 09/22/2017 0856   CREATININE 9.50 (H) 07/08/2023 0358   CREATININE 1.08 09/01/2013 1615   CALCIUM 9.6 07/08/2023 0317   PROT 7.4 07/08/2023 0317   ALBUMIN 3.5 07/08/2023 0317   AST 21 07/08/2023 0317   ALT 17 07/08/2023 0317   ALKPHOS 95 07/08/2023 0317   BILITOT 0.7 07/08/2023 0317   GFRNONAA 6 (L) 07/08/2023 0317   GFRNONAA 77 09/01/2013 1615   GFRAA 50 (L) 09/22/2017 0856   GFRAA 89 09/01/2013 1615    Imaging: Chest x-ray with bilateral mid and lower lung interstitial airspace opacities  EKG: Sinus rhythm, no peaked T waves appreciated  ASSESSMENT & PLAN:   Assessment & Plan by Problem: Principal Problem:   Acute hypoxemic respiratory failure (HCC) Active Problems:   Acute on chronic HFrEF (heart failure with reduced  ejection fraction) (HCC)   ESRD (end stage renal disease) (HCC)   Acute respiratory failure with hypoxia (HCC)   Acute pulmonary edema (HCC)   Ronald Reeves is a 67 y.o. person living with a history of ESRD on HD, HFrEF who presented with dyspnea and admitted for acute hypoxic respiratory failure in setting of hypervolemia on  hospital day 0.  ICU assumed care as patient needed line placement and he had significant tachypnea while on BiPAP.  Acute hypoxic respiratory failure ESRD HD line access issues Hyperkalemia Patient presenting in the setting missed dialysis sessions.  He reports actually 2-week history of dyspnea that acutely worsened in the last 2 days.  On my initial exam he is tachypneic, however satting well on room air.  He was able to talk to me in full sentences.  He did go on BiPAP early this morning, but had not required it in several hours.  I was alerted via secure chat a few hours following my exam.  Patient now with respiratory retraction and increased efforts.  He was placed on BiPAP.  Respiratory rate remained elevated in the 30s to 40s.  He had been given IV Lasix with some urine produced.  Vascular surgery did not have any OR times today.  PCCM consulted for line placement and respiratory distress in setting of fluid overload from dialysis.  Appreciate their assistance in this case.  I talked with Joneen Roach.  If patient is able to get HD today I expect that his respiratory status will be much more stable tomorrow.  I will touch base with PCCM attending at 7 AM and IMTS will take over care if appropriate.  -IV Lasix 40 mg followed by 120 mg per nephrology -Appreciate PCCM's assistance with temporary HD catheter placement. -Nephrology planning for HD today -Vascular surgery planning to do tunneled dialysis catheter on Friday -IMTS tentatively to assume care tomorrow a.m. at 7  Diet: NPO VTE: None IVF: None,None Code: Full discussed with patient and his wife.  Wife  endorses desire for full code and patient agrees with this  Prior to Admission Living Arrangement: Home, living wife Anticipated Discharge Location: Home Barriers to Discharge: Vascular surgery TDC on Friday  Dispo: Admit patient to Inpatient with expected length of stay greater than 2 midnights.  Signed: Rudene Christians, DO Internal Medicine Resident PGY-3  07/08/2023, 5:29 PM

## 2023-07-08 NOTE — Procedures (Addendum)
Central Venous Catheter Insertion Procedure Note  Ronald Reeves  [id number]  Mar 31, 1956  Date:07/08/23  Time:2:32 PM   Provider Performing:Perian Tedder W Mikey Bussing   Procedure: Insertion of Non-tunneled Central Venous [phone]) with US guidance ([id number])   Indication(s) Hemodialysis  Consent Risks of the procedure as well as the alternatives and risks of each were explained to the patient and/or caregiver.  Consent for the procedure was obtained and is signed in the bedside chart  Anesthesia Topical only with 1% lidocaine   Timeout Verified patient identification, verified procedure, site/side was marked, verified correct patient position, special equipment/implants available, medications/allergies/relevant history reviewed, required imaging and test results available.  Sterile Technique Maximal sterile technique including full sterile barrier drape, hand hygiene, sterile gown, sterile gloves, mask, hair covering, sterile ultrasound probe cover (if used).  Procedure Description Area of catheter insertion was cleaned with chlorhexidine and draped in sterile fashion.  With real-time ultrasound guidance a HD catheter was placed into the right femoral vein. Nonpulsatile blood flow and easy flushing noted in all ports.  The catheter was sutured in place and sterile dressing applied.  Complications/Tolerance None; patient tolerated the procedure well. Chest X-ray is ordered to verify placement for internal jugular or subclavian cannulation.   Chest x-ray is not ordered for femoral cannulation.  EBL Minimal  Specimen(s) None      Joneen Roach, AGACNP-BC [facility]  See Amion for personal pager PCCM on call pager [phone] until 7pm. Please call Elink 7p-7a. [phone]  07/08/2023 2:32 PM

## 2023-07-08 NOTE — ED Notes (Signed)
Patient sitting at edge of bed in tripod position. MD aware

## 2023-07-08 NOTE — Progress Notes (Signed)
Pt receives out-pt HD at [facility] [place] on TTS. Will assist as needed.   Olivia Canter Renal Navigator [phone]

## 2023-07-08 NOTE — ED Provider Notes (Signed)
[place] EMERGENCY DEPARTMENT AT [facility] Provider Note   CSN: [id number] Arrival date & time: 07/08/23  0240     History  Chief Complaint  Patient presents with   Shortness of Breath    Ronald Reeves is a 67 y.o. male.  The history is provided by the patient, the EMS personnel and medical records.  Ronald Reeves is a 67 y.o. male who presents to the Emergency Department complaining of shortness of breath.  He presents to the emergency department by EMS for evaluation of shortness of breath that started 2 days ago but worsened significantly over the last few hours.  He has a history of ESRD and is on hemodialysis traditionally Tuesdays, Thursday, Saturday.  His last session was on Saturday.  He was supposed to have a holiday adjusted session on Monday but was unable to have the session due to water issues and it is scheduled for Wednesday.  He does still make urine.  He has associated chest tightness.  He also reports progressive dyspnea on exertion.  He has cough that is productive of clear sputum.  He has been evaluated as an outpatient with pulmonary and states that they recommended a CT of his chest.  He also has a history of hypertension, CHF, DVT on anticoagulation.  He used 8 to 10 puffs of his albuterol without any improvement in symptoms.  EMS reports sats of 91% on room air.  He was 100% on nasal cannula.  No wheezing per EMS.     Home Medications Prior to Admission medications   Medication Sig Start Date End Date Taking? Authorizing Provider  albuterol (VENTOLIN HFA) 108 (90 Base) MCG/ACT inhaler Inhale 2 puffs into the lungs every 6 (six) hours as needed for wheezing or shortness of breath.    [provider]  amLODipine (NORVASC) 10 MG tablet Take 1 tablet (10 mg total) by mouth daily. 01/01/18   Lewayne Bunting, MD  apixaban (ELIQUIS) 5 MG TABS tablet Take 1 tablet (5 mg total) by mouth 2 (two) times daily. 03/06/23   Atway, Derwood Kaplan, DO  aspirin EC 81 MG  tablet Take 81 mg by mouth daily.    [provider]  diclofenac Sodium (VOLTAREN) 1 % GEL Apply 1 Application topically 2 (two) times daily as needed (pain).    [provider]  furosemide (LASIX) 40 MG tablet Take 40 mg by mouth 2 (two) times daily.    [provider]  isosorbide mononitrate (IMDUR) 30 MG 24 hr tablet Take 1 tablet (30 mg total) by mouth daily. 01/01/18   Lewayne Bunting, MD  lidocaine (LIDODERM) 5 % Place 1 patch onto the skin daily as needed (pain). Remove & Discard patch within 12 hours or as directed by MD    [provider]  multivitamin (RENA-VIT) TABS tablet Take 1 tablet by mouth daily.    [provider]  naloxone [facility]) nasal spray 4 mg/0.1 mL Place 0.4 mg into the nose See admin instructions.  SPRAY 1 SPRAY INTO ONE NOSTRIL AS DIRECTED AS NEEDED FOR RESCUE FOR OPIOID OVERDOSE - CALL 911 IMMEDIATELY, ADMINISTER DOSE, THEN TURN PERSON ON SIDE - IF NO RESPONSE IN 2-3 MINUTES OR PERSON RESPONDS BUT RELAPSES, REPEAT USING A NEW SPRAY DEVICE AND SPRAY INTO THE OTHER NOSTRIL AS NEEDED FOR RESCUE 01/21/23   [provider]  omeprazole (PRILOSEC) 40 MG capsule Take 1 capsule (40 mg total) by mouth daily. Patient taking differently: Take 40 mg by mouth  2 (two) times daily as needed. 06/06/22   Arnaldo Natal, NP  sevelamer carbonate (RENVELA) 800 MG tablet Take 1,600 mg by mouth 3 (three) times daily with meals. 01/06/23   [provider]  sorbitol 70 % solution Take 10 mLs by mouth 3 (three) times daily as needed (constipation). 01/20/23   [provider]  tamsulosin (FLOMAX) 0.4 MG CAPS capsule Take 0.4 mg by mouth 2 (two) times daily.    [provider]      Allergies    Gabapentin and Iodinated contrast media    Review of Systems   Review of Systems  All other systems reviewed and are negative.   Physical Exam Updated Vital Signs BP (!) 174/95   Pulse 89   Temp 98.2 F (36.8  C) (Oral)   Resp (!) 24   Ht 5\' 11"  (1.803 m)   Wt 72.6 kg   SpO2 100%   BMI 22.32 kg/m  Physical Exam Vitals and nursing note reviewed.  Constitutional:      Appearance: He is well-developed.  HENT:     Head: Normocephalic and atraumatic.  Cardiovascular:     Rate and Rhythm: Regular rhythm. Tachycardia present.     Heart sounds: No murmur heard. Pulmonary:     Effort: Pulmonary effort is normal. No respiratory distress.     Comments: Tachypnea.  Speaks in full sentences.  There are fine crackles at the bases bilaterally. Abdominal:     Palpations: Abdomen is soft.     Tenderness: There is no abdominal tenderness. There is no guarding or rebound.  Musculoskeletal:        General: No swelling or tenderness.     Comments: Fistula in the right upper extremity with palpable thrill  Skin:    General: Skin is warm and dry.  Neurological:     Mental Status: He is alert and oriented to person, place, and time.  Psychiatric:        Behavior: Behavior normal.     ED Results / Procedures / Treatments   Labs (all labs ordered are listed, but only abnormal results are displayed) Labs Reviewed  COMPREHENSIVE METABOLIC PANEL - Abnormal; Notable for the following components:      Result Value   Potassium 5.2 (*)    CO2 20 (*)    Glucose, Bld 116 (*)    BUN 66 (*)    Creatinine, Ser 9.39 (*)    GFR, Estimated 6 (*)    All other components within normal limits  CBC WITH DIFFERENTIAL/PLATELET - Abnormal; Notable for the following components:   Hemoglobin 12.4 (*)    HCT 38.8 (*)    RDW 16.5 (*)    All other components within normal limits  BRAIN NATRIURETIC PEPTIDE - Abnormal; Notable for the following components:   B Natriuretic Peptide >4,500.0 (*)    All other components within normal limits  I-STAT CHEM 8, ED - Abnormal; Notable for the following components:   BUN 58 (*)    Creatinine, Ser 9.50 (*)    Glucose, Bld 111 (*)    All other components within normal limits   TROPONIN I (HIGH SENSITIVITY) - Abnormal; Notable for the following components:   Troponin I (High Sensitivity) 88 (*)    All other components within normal limits  TROPONIN I (HIGH SENSITIVITY) - Abnormal; Notable for the following components:   Troponin I (High Sensitivity) 77 (*)    All other components within normal limits  HEPATITIS B SURFACE  ANTIBODY, QUANTITATIVE  HEPATITIS B SURFACE ANTIGEN    EKG None  Radiology DG Chest Port 1 View  Result Date: 07/08/2023 CLINICAL DATA:  Shortness of breath for 2 days. Low oxygen saturations. EXAM: PORTABLE CHEST 1 VIEW COMPARISON:  05/21/2023 FINDINGS: Stable cardiomegaly. Aortic atherosclerotic calcification. Bilateral mid and lower lung interstitial and airspace opacities. No pleural effusion or pneumothorax. IMPRESSION: Bilateral mid and lower lung interstitial and airspace opacities, favor edema though infection could appear similarly. Electronically Signed   By: Minerva Fester M.D.   On: 07/08/2023 03:56    Procedures Procedures    Medications Ordered in ED Medications  Chlorhexidine Gluconate Cloth 2 % PADS 6 each (has no administration in time range)    ED Course/ Medical Decision Making/ A&P                                 Medical Decision Making Amount and/or Complexity of Data Reviewed Labs: ordered. Radiology: ordered.   Patient with history of CHF, ESRD on hemodialysis here for evaluation of increased shortness of breath, has missed dialysis due to holiday schedule and staffing issues.  Patient with fine crackles in the bases bilaterally, tachypnea with sats of 100% on room air.  He does appear uncomfortable.  Chest x-ray with bilateral airspace opacities which favor pulmonary edema-images personally reviewed and interpreted, agree with radiologist interpretation.  BNP is elevated similar when compared to priors.  Troponins are mildly elevated but downtrending.  Overall suspect he is volume overloaded due to missed  dialysis.  Discussed with patient option for admission to the hospital to evaluate for relief of treatment following dialysis versus dialysis session and reassessment in the emergency department.  Patient prefers dialysis with reassessment as he does not want to be in the hospital for Thanksgiving.  Feel this is a reasonable plan.  Current clinical picture is not consistent with PE, pneumonia, COPD exacerbation.  Patient care transferred pending dialysis and reevaluation.        Final Clinical Impression(s) / ED Diagnoses Final diagnoses:  None    Rx / DC Orders ED Discharge Orders     None         Tilden Fossa, MD 07/08/23 [phone]

## 2023-07-08 NOTE — ED Notes (Signed)
IP providers at bedside.

## 2023-07-08 NOTE — Procedures (Addendum)
HD Note:  Some information was entered later than the data was gathered due to patient care needs. The stated time with the data is accurate.  Patient treated at bedside.  Alert and oriented.   Informed consent signed and in chart.   Access used: Right temporary femoral trialysis catheter Access issues: None  Patient was on BiPAP when pre assessment was happening.  He was placed onto O2 [place] 2.5 L/M prior to the start of treatment. Patient breathing was rapid with ancillary muscle usage. Patient did beginning resting with eyes closed, breathing with less auxillary muscle use at approximately the 1.5 hour mark of the treatment. Patient did experience coughing outbursts during the treatment that would awaken him slightly. Patient did have episodic cramping in his left leg. UF was turned off intermittently. 200 ml NS bolus was given to attempt to alleviate the pain. The patient stated he needed to end treatment as the cramping was becoming unbearable.  This was witnessed by the two nurses in the room  Patient re validated when asked again.   TX duration: 2 hours and 28 min.  Patient cramping was relieved after about 5 minutes post termination of treatment  Patient breathing rate at 26 and no ancillary muscle use noted.  Total UF removed: 2500 ml  Hand-off given to patient's nurse.     Nakeeta Sebastiani L. Dareen Piano, RN [facility] Dialysis Unit.

## 2023-07-08 NOTE — Procedures (Signed)
HD Note:  Some information was entered later than the data was gathered due to patient care needs. The stated time with the data is accurate.  Received patient in bed to unit.   Alert and oriented.   Informed consent signed and in chart.   Patient fistula in right upper arm was not able to be cannulated.  Dr. Thedore Mins at bedside and aware.  Staff attempts were met with clots being removed.:  Alert, without acute distress.  Hand-off given to patient's nurse.   Transported back to the room   Jaliyah Fotheringham L. Dareen Piano, RN [facility].

## 2023-07-08 NOTE — Progress Notes (Addendum)
[facility] [place]   Ronald Reeves is a 67 y.o. year old male who presents s/p right second stage basilic vein transposition (brachiobasilic arteriovenous fistula) placement 02/18/23 by Dr. Chestine Spore. He was having prolonged bleeding so Dr. Karin Lieu performed fistulogram with angioplasty of the mid fistula on 06/17/23.  He was admitted yesterday and HD was attempted this am.  Unable to perform HD.  Pulsitile flow in the fistula.  We have been asked to place [facility]  Last [facility] was placed by Dr. Chestine Spore on 03/05/23 right femoral with previous femora cathter that patient pulled out accidentally the day prior.  He ate at 7am this morning and missed HD Tuesday.  History of Drugs: alcohol, Marijuana and Cocaine   03/12/17 left AV fistula 07/15/21 ligation of branches left RC av fistula 10/18/21 Brachiobasilic fistula creation, second stage  05/01/22 hematoma in left arm after traumatic cannulation for dialysis, placement of right internal jugular TDC and hematoma evacuation 11/28/22  right first stage basilic vein transposition (brachiobasilic arteriovenous fistula) placement  02/18/23 second stage right UE basilic fistula 11/22/22 replacement of right femoral TDC ( I'm not sure who placed the first right femoral TDC)   Findings: Greater than 80% tapering stenosis at the mid fistula spanning 15 cm No inflow stenosis 50% stenosis at the costoclavicular junction  Positive Cocaine & Cannabis    Objective (!) 152/92 91 98.2 F (36.8 C) (!) 28 99% No intake or output data in the 24 hours ending 07/08/23 0841  Right UE pulsatile thrill to palpation and Dialysis lab was unable to stick for HD.   Right UE with palpable radial pulse, grip 5/5,, no symptoms of steal.    Assessment/Planning: Malfunctioning right UE AV fistula History of prolonged bleeding with angioplasty recently now unable to cannulate for HD.  Asked to provide [facility].  NPO he last ate today at 7 am and has not had HD since  Saturday and is SOB Dr. Lenell Antu will discuss timing of [facility] placement with patient.  Chest  X ray 07/08/23 IMPRESSION: Bilateral mid and lower lung interstitial and airspace opacities, favor edema though infection could appear similarly.  He will need to be admitted to medicine service.  He was sent back to the ED  Mosetta Pigeon 07/08/2023 8:41 AM --  Laboratory Lab Results: Recent Labs    07/08/23 0317 07/08/23 0358  WBC 5.8  --   HGB 12.4* 14.3  HCT 38.8* 42.0  PLT 169  --    BMET Recent Labs    07/08/23 0317 07/08/23 0358  NA 140 140  K 5.2* 5.0  CL 105 106  CO2 20*  --   GLUCOSE 116* 111*  BUN 66* 58*  CREATININE 9.39* 9.50*  CALCIUM 9.6  --     COAG Lab Results  Component Value Date   INR 1.3 (H) 03/18/2023   INR 1.2 03/13/2023   INR 1.1 12/16/2022   No results found for: "PTT"   VASCULAR STAFF ADDENDUM: I have independently interviewed and examined the patient. I agree with the above.  Plan [facility] with one of my partners Friday.  Rande Brunt. Lenell Antu, MD [facility] Phone Number: [phone] 07/08/2023 3:40 PM

## 2023-07-08 NOTE — Consult Note (Signed)
[facility] Renal Consultation Note    Indication for Consultation:  Management of ESRD/hemodialysis; anemia, hypertension/volume and secondary hyperparathyroidism PCP:  HPI: Ronald Reeves is a 67 y.o. 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 PMH: HTN, HFrEF, COPD, anemia of ESRD, SHPT. Polysubstance abuse, provided note states that he uses cocaine at least twice weekly. Continues to smoke cigars. He was brought to ED today with increased SOB. Last HD 07/04/2023. He tells me that he has been having ongoing issues with SOB. He was supposed to go HD yesterday but went to pulmonary physician instead. He was brought to HD unit this AM for dialysis but was unable to access AVF. VVS was called, he was seen at bedside by Clinton Gallant PAC. Plans were made to place [facility] however when he returned to ED, he continued to decompensate respiratory wise and has now been placed on BIPAP. CXR Bilateral mid and lower lung interstitial and airspace opacities, favor edema though infection could appear similarly. He has visible retractions on bipap. PCCM has been called for placement of temporary femoral dialysis catheter with urgent dialysis to follow. He has been given Furosemide 120 mg IV furosemide per primary.     Past Medical History:  Diagnosis Date   Anemia    Arthritis    Back pain    Bronchitis    COPD (chronic obstructive pulmonary disease) (HCC)    Coronary artery disease    COVID    mild - flu like symptoms   Dyspnea    w/ exertion, uses inhaler   ESRD on hemodialysis (HCC)    dialysis on tues, thurs, sat at [facility]   GERD (gastroesophageal reflux disease)    not a current problem   Heart murmur    never has caused any problems   Hypertension    Myocardial infarction (HCC)    Pneumonia    x 1   Pre-diabetes    diet controlled, no meds, does not check blood sugar   Substance abuse [facility])    Past Surgical History:  Procedure Laterality Date   A/V FISTULAGRAM  Right 06/17/2023   Procedure: A/V Fistulagram;  Surgeon: Victorino Sparrow, MD;  Location: [facility] INVASIVE CV LAB;  Service: Cardiovascular;  Laterality: Right;   AV FISTULA PLACEMENT Left 04/12/2021   Procedure: LEFT ARM ARTERIOVENOUS (AV) FISTULA CREATION;  Surgeon: Cephus Shelling, MD;  Location: [facility] OR;  Service: Vascular;  Laterality: Left;   AV FISTULA PLACEMENT Left 10/18/2021   Procedure: LEFT ARM BRACHIOBASILIC FISTULA CREATION FIRST STAGE;  Surgeon: Cephus Shelling, MD;  Location: [facility] OR;  Service: Vascular;  Laterality: Left;   AV FISTULA PLACEMENT Right 11/28/2022   Procedure: RIGHT ARM FIRST STAGE BRACHIOBASILIC FISTULA CREATION;  Surgeon: Cephus Shelling, MD;  Location: [facility] OR;  Service: Vascular;  Laterality: Right;   BACK SURGERY     BASCILIC VEIN TRANSPOSITION Left 01/27/2022   Procedure: LEFT SECOND STAGE BASILIC VEIN TRANSPOSITION;  Surgeon: Cephus Shelling, MD;  Location: [facility] OR;  Service: Vascular;  Laterality: Left;   BASCILIC VEIN TRANSPOSITION Right 02/18/2023   Procedure: SECOND STAGE RIGHT ARM BASILIC VEIN TRANSPOSITION;  Surgeon: Cephus Shelling, MD;  Location: [facility] OR;  Service: Vascular;  Laterality: Right;   BIOPSY  08/20/2022   Procedure: BIOPSY;  Surgeon: Shellia Cleverly, DO;  Location: [facility] ENDOSCOPY;  Service: Gastroenterology;;   COLONOSCOPY WITH PROPOFOL N/A 08/20/2022   Procedure: COLONOSCOPY WITH PROPOFOL;  Surgeon: Shellia Cleverly, DO;  Location: [facility]  ENDOSCOPY;  Service: Gastroenterology;  Laterality: N/A;   DIALYSIS/PERMA CATHETER INSERTION Right 03/05/2023   Procedure: DIALYSIS/PERMA CATHETER INSERTION;  Surgeon: Cephus Shelling, MD;  Location: [facility] INVASIVE CV LAB;  Service: Cardiovascular;  Laterality: Right;   ESOPHAGOGASTRODUODENOSCOPY (EGD) WITH PROPOFOL N/A 08/20/2022   Procedure: ESOPHAGOGASTRODUODENOSCOPY (EGD) WITH PROPOFOL;  Surgeon: Shellia Cleverly, DO;  Location: [facility] ENDOSCOPY;  Service: Gastroenterology;  Laterality: N/A;    INSERTION OF DIALYSIS CATHETER Right 05/01/2022   Procedure: INSERTION OF DIALYSIS CATHETER;  Surgeon: [name], MD;  Location: [facility] OR;  Service: Vascular;  Laterality: Right;   IR REMOVAL TUN CV CATH W/O FL  12/19/2022   LIGATION OF COMPETING BRANCHES OF ARTERIOVENOUS FISTULA Left 07/15/2021   Procedure: LIGATION OF COMPETING BRANCHES OF LEFT RADIOCEPHALIC ARTERIOVENOUS FISTULA TIMES TWO;  Surgeon: Cephus Shelling, MD;  Location: [facility] OR;  Service: Vascular;  Laterality: Left;   PERIPHERAL VASCULAR INTERVENTION  06/17/2023   Procedure: PERIPHERAL VASCULAR INTERVENTION;  Surgeon: Victorino Sparrow, MD;  Location: [facility] INVASIVE CV LAB;  Service: Cardiovascular;;   POLYPECTOMY  08/20/2022   Procedure: POLYPECTOMY;  Surgeon: Shellia Cleverly, DO;  Location: [facility] ENDOSCOPY;  Service: Gastroenterology;;   REVISON OF ARTERIOVENOUS FISTULA Left 07/15/2021   Procedure: REVISON OF LEFT ARTERIOVENOUS FISTULA;  Surgeon: Cephus Shelling, MD;  Location: [facility] OR;  Service: Vascular;  Laterality: Left;   REVISON OF ARTERIOVENOUS FISTULA Left 05/01/2022   Procedure: ARTERIOVENOUS FISTULA WASHOUT OF ARM HEMATOMA;  Surgeon: [name], MD;  Location: [facility] OR;  Service: Vascular;  Laterality: Left;   Family History  Problem Relation Age of Onset   Heart disease Mother    Heart attack Sister 29   Social History:  reports that he has been smoking cigars and cigarettes. He has been exposed to tobacco smoke. He has never used smokeless tobacco. He reports current alcohol use of about 14.0 standard drinks of alcohol per week. He reports current drug use. Frequency: 1.00 time per week. Drugs: Marijuana and Cocaine. Allergies  Allergen Reactions   Gabapentin     Encephalopathy, tremor   Iodinated Contrast Media Nausea And Vomiting   Prior to Admission medications   Medication Sig Start Date End Date Taking? Authorizing Provider  albuterol (VENTOLIN HFA) 108 (90 Base) MCG/ACT inhaler Inhale 2 puffs into the  lungs every 6 (six) hours as needed for wheezing or shortness of breath.   Yes [provider]  amLODipine (NORVASC) 10 MG tablet Take 1 tablet (10 mg total) by mouth daily. 01/01/18  Yes Lewayne Bunting, MD  apixaban (ELIQUIS) 5 MG TABS tablet Take 1 tablet (5 mg total) by mouth 2 (two) times daily. 03/06/23  Yes Atway, Rayann N, DO  B Complex-C-Folic Acid (DIALYVITE TABLET) TABS Take 1 tablet by mouth daily. 12/23/22  Yes [provider]  furosemide (LASIX) 40 MG tablet Take 40 mg by mouth daily.   Yes [provider]  hydrALAZINE (APRESOLINE) 25 MG tablet Take 25 mg by mouth 2 (two) times daily.   Yes [provider]  isosorbide mononitrate (IMDUR) 30 MG 24 hr tablet Take 1 tablet (30 mg total) by mouth daily. 01/01/18  Yes Lewayne Bunting, MD  labetalol (NORMODYNE) 100 MG tablet Take 100 mg by mouth 2 (two) times daily.   Yes [provider]  lidocaine (LIDODERM) 5 % Place 1 patch onto the skin daily as needed (pain). Remove & Discard patch within 12 hours or as directed by MD   Yes [provider]  mirtazapine (REMERON) 15 MG tablet Take 7.5-15 mg by mouth at bedtime as needed (Sleep).   Yes [provider]  naloxone (NARCAN) nasal spray 4 mg/0.1 mL Place 0.4 mg into the nose See admin instructions.  SPRAY 1 SPRAY INTO ONE NOSTRIL AS DIRECTED AS NEEDED FOR RESCUE FOR OPIOID OVERDOSE - CALL 911 IMMEDIATELY, ADMINISTER DOSE, THEN TURN PERSON ON SIDE - IF NO RESPONSE IN 2-3 MINUTES OR PERSON RESPONDS BUT RELAPSES, REPEAT USING A NEW SPRAY DEVICE AND SPRAY INTO THE OTHER NOSTRIL AS NEEDED FOR RESCUE 01/21/23  Yes [provider]  omeprazole (PRILOSEC) 40 MG capsule Take 1 capsule (40 mg total) by mouth daily. 06/06/22  Yes Arnaldo Natal, NP  rosuvastatin (CRESTOR) 10 MG tablet Take 10 mg by mouth daily.   Yes [provider]  sevelamer carbonate (RENVELA) 800 MG tablet Take 1,600 mg by mouth 3 (three) times daily  with meals. 01/06/23  Yes [provider]  sorbitol 70 % solution Take 10 mLs by mouth 3 (three) times daily as needed (constipation). 01/20/23  Yes [provider]  tamsulosin (FLOMAX) 0.4 MG CAPS capsule Take 0.8 mg by mouth 2 (two) times daily.   Yes [provider]  VITAMIN D, ERGOCALCIFEROL, PO Take 1 tablet by mouth daily. 03/31/23 03/29/24 Yes [provider]  carvedilol (COREG) 12.5 MG tablet Take 12.5 mg by mouth 2 (two) times daily with a meal. Patient not taking: Reported on 07/08/2023    [provider]  gabapentin (NEURONTIN) 300 MG capsule Take 300 mg by mouth 3 (three) times daily. Patient not taking: Reported on 07/08/2023    [provider]   Current Facility-Administered Medications  Medication Dose Route Frequency Provider Last Rate Last Admin   bumetanide (BUMEX) tablet 4 mg  4 mg Oral Daily Masters, Katie, DO       Chlorhexidine Gluconate Cloth 2 % PADS 6 each  6 each Topical Q0600 Dagoberto Ligas, MD       furosemide (LASIX) 120 mg in dextrose 5 % 50 mL IVPB  120 mg Intravenous Once Masters, Katie, DO       [START ON 07/09/2023] heparin injection 5,000 Units  5,000 Units Subcutaneous Q8H Masters, Katie, DO       Current Outpatient Medications  Medication Sig Dispense Refill   albuterol (VENTOLIN HFA) 108 (90 Base) MCG/ACT inhaler Inhale 2 puffs into the lungs every 6 (six) hours as needed for wheezing or shortness of breath.     amLODipine (NORVASC) 10 MG tablet Take 1 tablet (10 mg total) by mouth daily. 90 tablet 3   apixaban (ELIQUIS) 5 MG TABS tablet Take 1 tablet (5 mg total) by mouth 2 (two) times daily.     B Complex-C-Folic Acid (DIALYVITE TABLET) TABS Take 1 tablet by mouth daily.     furosemide (LASIX) 40 MG tablet Take 40 mg by mouth daily.     hydrALAZINE (APRESOLINE) 25 MG tablet Take 25 mg by mouth 2 (two) times daily.     isosorbide mononitrate (IMDUR) 30 MG 24 hr tablet Take 1 tablet (30 mg total) by mouth  daily. 90 tablet 3   labetalol (NORMODYNE) 100 MG tablet Take 100 mg by mouth 2 (two) times daily.     lidocaine (LIDODERM) 5 % Place 1 patch onto the skin daily as needed (pain). Remove & Discard patch within 12 hours or as directed by MD     mirtazapine (REMERON) 15 MG tablet Take 7.5-15 mg  by mouth at bedtime as needed (Sleep).     naloxone (NARCAN) nasal spray 4 mg/0.1 mL Place 0.4 mg into the nose See admin instructions.  SPRAY 1 SPRAY INTO ONE NOSTRIL AS DIRECTED AS NEEDED FOR RESCUE FOR OPIOID OVERDOSE - CALL 911 IMMEDIATELY, ADMINISTER DOSE, THEN TURN PERSON ON SIDE - IF NO RESPONSE IN 2-3 MINUTES OR PERSON RESPONDS BUT RELAPSES, REPEAT USING A NEW SPRAY DEVICE AND SPRAY INTO THE OTHER NOSTRIL AS NEEDED FOR RESCUE     omeprazole (PRILOSEC) 40 MG capsule Take 1 capsule (40 mg total) by mouth daily. 30 capsule 2   rosuvastatin (CRESTOR) 10 MG tablet Take 10 mg by mouth daily.     sevelamer carbonate (RENVELA) 800 MG tablet Take 1,600 mg by mouth 3 (three) times daily with meals.     sorbitol 70 % solution Take 10 mLs by mouth 3 (three) times daily as needed (constipation).     tamsulosin (FLOMAX) 0.4 MG CAPS capsule Take 0.8 mg by mouth 2 (two) times daily.     VITAMIN D, ERGOCALCIFEROL, PO Take 1 tablet by mouth daily.     carvedilol (COREG) 12.5 MG tablet Take 12.5 mg by mouth 2 (two) times daily with a meal. (Patient not taking: Reported on 07/08/2023)     gabapentin (NEURONTIN) 300 MG capsule Take 300 mg by mouth 3 (three) times daily. (Patient not taking: Reported on 07/08/2023)     Labs: Basic Metabolic Panel: Recent Labs  Lab 07/08/23 0317 07/08/23 0358  NA 140 140  K 5.2* 5.0  CL 105 106  CO2 20*  --   GLUCOSE 116* 111*  BUN 66* 58*  CREATININE 9.39* 9.50*  CALCIUM 9.6  --    Liver Function Tests: Recent Labs  Lab 07/08/23 0317  AST 21  ALT 17  ALKPHOS 95  BILITOT 0.7  PROT 7.4  ALBUMIN 3.5   No results for input(s): "LIPASE", "AMYLASE" in the last 168 hours. No  results for input(s): "AMMONIA" in the last 168 hours. CBC: Recent Labs  Lab 07/08/23 0317 07/08/23 0358  WBC 5.8  --   NEUTROABS 3.7  --   HGB 12.4* 14.3  HCT 38.8* 42.0  MCV 86.8  --   PLT 169  --    Cardiac Enzymes: No results for input(s): "CKTOTAL", "CKMB", "CKMBINDEX", "TROPONINI" in the last 168 hours. CBG: No results for input(s): "GLUCAP" in the last 168 hours. Iron Studies: No results for input(s): "IRON", "TIBC", "TRANSFERRIN", "FERRITIN" in the last 72 hours. Studies/Results: DG Chest Port 1 View  Result Date: 07/08/2023 CLINICAL DATA:  Shortness of breath for 2 days. Low oxygen saturations. EXAM: PORTABLE CHEST 1 VIEW COMPARISON:  05/21/2023 FINDINGS: Stable cardiomegaly. Aortic atherosclerotic calcification. Bilateral mid and lower lung interstitial and airspace opacities. No pleural effusion or pneumothorax. IMPRESSION: Bilateral mid and lower lung interstitial and airspace opacities, favor edema though infection could appear similarly. Electronically Signed   By: Minerva Fester M.D.   On: 07/08/2023 03:56    ROS: As per HPI otherwise negative.   Physical Exam: Vitals:   07/08/23 1142 07/08/23 1145 07/08/23 1147 07/08/23 1213  BP:      Pulse: 83 92 89   Resp: (!) 34 (!) 37 (!) 34   Temp:    98.1 F (36.7 C)  TempSrc:    Oral  SpO2: 98% 100% 100%   Weight:      Height:         General:Thin, chronically ill appearing male in acute  respiratory distress on BIPAP.  Head: Normocephalic, atraumatic, sclera non-icteric, mucus membranes are DRY Neck: Supple. JVD elevated to mandible Lungs: RR 36-42 on BIPAP with retractions visible. Scattered crackles and upper airway rhonchi present.  Heart: SR HR 90s. S1.S2 No M/R/B Abdomen: Soft, NABS Lower extremities: No LE edema present Neuro: Alert and oriented X 3. Moves all extremities spontaneously. Psych:  Responds to questions appropriately with a normal affect. Dialysis Access: R AVF pulsatile faint  bruit  Dialysis Orders: Center: [facility] T,Th,S 4 hours 180NRe 400/Autoflow 1.5 65 kg 2.0 K/ 2.0 Ca AVF -Heparin 2500 units IV three times per week -Calcitriol 1.75 mcg PO three times per week Renvela binders 800 mg 2 tabs PO TIC AC   Assessment/Plan:  Acute Hypoxic Respiratory in setting of missed HD: Unfortunately AVF malfunctioned and he is awaiting placement of temporary HD catheter. Will UF as tolerated and optimize volume with HD.   HFrEF- EF 30-35% with G2DD 12/2022.   ESRD -  T,Th,S. No HD since 07/04/2023. HD today when access is placed.   Hypertension/volume  - Very hypertensive at present with evidence of volume overload by exam. UF and lower volume as tolerated. Will resume home meds including furosemide when able.   Anemia  - HGB 12.4. No ESA. Follow HGB  Metabolic bone disease -  Continue VDRA, binders.   Nutrition - Albumin 3.5  COPD  Polysubstance abuse-cocaine.   Dnaiel Voller H. Manson Passey, NP-C 07/08/2023, 12:17 PM  [employer] [phone]

## 2023-07-08 NOTE — Progress Notes (Signed)
PCCM Interval Progress Note:  Called to bedside by E-Link MD/staff RN for patient wishing to leave AMA. Situation had become escalated and potentially unsafe after repeated attempts by healthcare team to convince patient to stay. Patient's wife was contacted by E-Link MD (Dr. Warrick Parisian); patient refused contact of his daughter and threatened bedside RN should team contact her despite his wishes.  On my arrival to bedside, situation had been deescalated by bedside RN. Security was present on the unit but did not have to enter the room. I entered the room and Ronald Reeves was calm, appropriate and cooperative with bedside RN Brianna's requests. AMA form was signed by patient, RN and myself as a witness.  R femoral Trialysis catheter was removed (sutures x 2 clipped) with tip of catheter intact. Manual pressure was held to the site for 10-15 minutes to ensure hemostasis prior to dressing placement. Gauze x 2 and Tegaderm was placed over the site without any strikethrough noted. Patient was provided with an additional dressing, should it become soiled.   At this point, patient wife arrived to the unit. Instructions for monitoring/dressing of the R groin catheter site were given, wife verbalized understanding. Due to necessary removal of patient's temporary Trialysis catheter prior to leaving AMA, detailed instructions were given to patient that he MUST follow up for [facility] placement +/- AVF revascularization or he will have no working dialysis access. This is tentatively scheduled for 11/29 (Dr. Chestine Spore). I am not sure that they will still be able to complete this procedure Friday if he is not inpatient, but we will notify the VVS team of patient's decision to leave AMA and discuss options. Will advise the patient to remain NPO after midnight 11/29 in the event this can still be completed.  Despite patient's wife attempting to convince him to stay, he was adamant to leave and was able to dress himself. PIV removed.  Reiterated to patient that he needs to come back to the hospital if he starts feeling poorly again. Patient verbalized understanding and was escorted off of the unit.  Ronald Lair, PA-C [facility] 07/08/23  Please see [email or web address] for pager details.  From 7A-7P if no response, please call [phone] After hours, please call ELink [phone]

## 2023-07-08 NOTE — ED Provider Notes (Signed)
9:15 AM Patient has returned from dialysis.  Unfortunately he was not able to get dialysis performed as his fistula was not working.  Vascular was consulted by nephrology but at this point, the patient has pulmonary edema, is tachypneic, and while he is not in distress he will need admission to figure out his dialysis access and to get dialyzed.   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  Vascular surgery is consulting.  Patient is tachypneic but not in distress and is not hypoxic.  I do not think he needs BiPAP at this time.   Pricilla Loveless, MD 07/08/23 [phone]

## 2023-07-08 NOTE — Progress Notes (Signed)
Seen in HD unit. A lot of access issues, unsuccessful cannulation. Faint bruit. He does report chronic issues with AVF I.e. prolonged bleeding. Central/outflow stenosis? Sending back to ER. Will consult VVS. He had his access worked on earlier this month with Dr. Karin Lieu.  Anthony Sar, MD [facility]

## 2023-07-08 NOTE — ED Notes (Addendum)
PT requesting to be placed back on bipap, Spo2 is 100% on room air and increased work of breathing with retractions and resp rate between 30-40. RT called.
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
## 2023-07-08 NOTE — Progress Notes (Signed)
RT called to bedside, pt requested to be placed back on bipap. RT assessed pt and pt is in respiratory distress and increased WOB, sats on RA 90%. RT placed pt on bipap, sats currently 100% and pt seems to be tolerating it well at this time. RT will monitor as needed.

## 2023-07-08 NOTE — Consult Note (Signed)
NAME:  Ronald Reeves, MRN:  [id number], DOB:  1956-02-03, LOS: 0 ADMISSION DATE:  07/08/2023, CONSULTATION DATE: 11/27 REFERRING MD: Dr. Julianne Rice, CHIEF COMPLAINT: Pulmonary edema  History of Present Illness:  67 year old male with past medical history as below, which is significant for COPD, CAD, substance abuse, and ESRD on hemodialysis.  He typically dialyzes on a Tuesday, Thursday, Saturday schedule, however, this was modified for the week of Thanksgiving.  Last dialysis was 11/23.  When he presented on Monday 11/25 there was an issue at the dialysis center and he was unable to be treated.  By Tuesday he was too dyspneic to go to dialysis and went to his cardiologist instead.  He was started on labetalol.  Shortness of breath did not improve causing him to present to most emergency department in the early a.m. hours of 11/27.  He had developed chest tightness which she was additionally complaining of.  Symptoms did not improve with albuterol use.  Workup in the emergency department was consistent with volume overload and pulmonary edema.  He was evaluated by the nephrologist and transferred to the dialysis unit for hemodialysis.  There was unfortunately an issue with his AV fistula and he was unable to be treated.  He was evaluated by vascular surgery who are unable to place tunneled dialysis catheter today and PCCM's been consulted for temporary dialysis access placement.  Additionally the patient developed shortness of breath and work of breathing worsened to the point he was placed on BiPAP.  Pertinent  Medical History   has a past medical history of Anemia, Arthritis, Back pain, Bronchitis, COPD (chronic obstructive pulmonary disease) (HCC), Coronary artery disease, COVID, Dyspnea, ESRD on hemodialysis (HCC), GERD (gastroesophageal reflux disease), Heart murmur, Hypertension, Myocardial infarction (HCC), Pneumonia, Pre-diabetes, and Substance abuse (HCC).   Significant Hospital  Events: Including procedures, antibiotic start and stop dates in addition to other pertinent events   11/16 fistulogram by Dr. Karin Lieu 11/25 unable to do HD due to an issue at the center.  11/17 present to ED with SOB, admitted for HD  Interim History / Subjective:    Objective   Blood pressure (!) 156/94, pulse 89, temperature 98.1 F (36.7 C), temperature source Oral, resp. rate (!) 34, height 5\' 11"  (1.803 m), weight 72.6 kg, SpO2 100%.    FiO2 (%):  [30 %] 30 %  No intake or output data in the 24 hours ending 07/08/23 1253 Filed Weights   07/08/23 0258  Weight: 72.6 kg    Examination: General: middle aged male in NAD HENT: [place]/AT, PERRL, elevated JVP Lungs: Tachypnea, on BiPAP, crackles. Using accessory muscles.  Cardiovascular: Tachy, regular, no MRG Abdomen: Soft, NT, ND Extremities: No acute deformity or ROM limitation Neuro: Alert, oriented, non-focal.   Resolved Hospital Problem list     Assessment & Plan:   Hypoxia secondary to pulmonary edema: - Continue BiPAP for work of breathing - O2 sat goal > 95%  - Will place vasc cath to facilitate urgent HD - Primary team attempting a dose of lasix  Acute on chronic HFrEF (BNP > 4500 in ED) HTN CAD - Needs UF for volume removal - Continue amlodipine, hydralazine, imdur,   ESRD on HD (TTS) - last HD 11/23 - Plan as above for temp cath and HD today - Vascular surgery tentatively planning for [facility] placement Friday.   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  - Patient has requested temp  cath to be placed in femoral vein  Substance abuse: no urine tox collected this admission but has been positive for cocaine as recently as 03/2023.  - Supportive care  COPD without acute exacerbation - albuterol PRN per home regimen    Best Practice (right click and "Reselect all SmartList Selections" daily)   Diet/type: NPO DVT  prophylaxis: heparin injection 5,000 Units Start: 07/09/23 0600   Pressure ulcer(s): not present on admission  GI prophylaxis: PPI Lines: Dialysis Catheter to be placed Foley:  N/A Code Status:  full code Last date of multidisciplinary goals of care discussion [ ]   Labs   CBC: Recent Labs  Lab 07/08/23 0317 07/08/23 0358  WBC 5.8  --   NEUTROABS 3.7  --   HGB 12.4* 14.3  HCT 38.8* 42.0  MCV 86.8  --   PLT 169  --     Basic Metabolic Panel: Recent Labs  Lab 07/08/23 0317 07/08/23 0358  NA 140 140  K 5.2* 5.0  CL 105 106  CO2 20*  --   GLUCOSE 116* 111*  BUN 66* 58*  CREATININE 9.39* 9.50*  CALCIUM 9.6  --    GFR: Estimated Creatinine Clearance: 8 mL/min (A) (by C-G formula based on SCr of 9.5 mg/dL (H)). Recent Labs  Lab 07/08/23 0317  WBC 5.8    Liver Function Tests: Recent Labs  Lab 07/08/23 0317  AST 21  ALT 17  ALKPHOS 95  BILITOT 0.7  PROT 7.4  ALBUMIN 3.5   No results for input(s): "LIPASE", "AMYLASE" in the last 168 hours. No results for input(s): "AMMONIA" in the last 168 hours.  ABG    Component Value Date/Time   HCO3 34.8 (H) 06/10/2023 1359   TCO2 23 07/08/2023 0358   O2SAT 62 06/10/2023 1359     Coagulation Profile: No results for input(s): "INR", "PROTIME" in the last 168 hours.  Cardiac Enzymes: No results for input(s): "CKTOTAL", "CKMB", "CKMBINDEX", "TROPONINI" in the last 168 hours.  HbA1C: Hgb A1c MFr Bld  Date/Time Value Ref Range Status  03/14/2023 05:38 AM 5.8 (H) 4.8 - 5.6 % Final    Comment:    (NOTE) Pre diabetes:          5.7%-6.4%  Diabetes:              >6.4%  Glycemic control for   <7.0% adults with diabetes     CBG: No results for input(s): "GLUCAP" in the last 168 hours.  Review of Systems:   Bolds are positive  Constitutional: weight loss, gain, night sweats, Fevers, chills, fatigue .  HEENT: headaches, Sore throat, sneezing, nasal congestion, post nasal drip, Difficulty swallowing,  Tooth/dental problems, visual complaints visual changes, ear ache CV:  chest pain, radiates:,Orthopnea, PND, swelling in lower extremities, dizziness, palpitations, syncope.  GI  heartburn, indigestion, abdominal pain, nausea, vomiting, diarrhea, change in bowel habits, loss of appetite, bloody stools.  Resp: cough, productive: , hemoptysis, dyspnea, chest pain, pleuritic.  Skin: rash or itching or icterus GU: dysuria, change in color of urine, urgency or frequency. flank pain, hematuria  MS: joint pain or swelling. decreased range of motion  Psych: change in mood or affect. depression or anxiety.  Neuro: difficulty with speech, weakness, numbness, ataxia    Past Medical History:  He,  has a past medical history of Anemia, Arthritis, Back pain, Bronchitis, COPD (chronic obstructive pulmonary disease) (HCC), Coronary artery disease, COVID, Dyspnea, ESRD on hemodialysis (HCC), GERD (gastroesophageal reflux disease), Heart murmur, Hypertension, Myocardial infarction (  HCC), Pneumonia, Pre-diabetes, and Substance abuse (HCC).   Surgical History:   Past Surgical History:  Procedure Laterality Date   A/V FISTULAGRAM Right 06/17/2023   Procedure: A/V Fistulagram;  Surgeon: Victorino Sparrow, MD;  Location: [facility] INVASIVE CV LAB;  Service: Cardiovascular;  Laterality: Right;   AV FISTULA PLACEMENT Left 04/12/2021   Procedure: LEFT ARM ARTERIOVENOUS (AV) FISTULA CREATION;  Surgeon: Cephus Shelling, MD;  Location: [facility] OR;  Service: Vascular;  Laterality: Left;   AV FISTULA PLACEMENT Left 10/18/2021   Procedure: LEFT ARM BRACHIOBASILIC FISTULA CREATION FIRST STAGE;  Surgeon: Cephus Shelling, MD;  Location: [facility] OR;  Service: Vascular;  Laterality: Left;   AV FISTULA PLACEMENT Right 11/28/2022   Procedure: RIGHT ARM FIRST STAGE BRACHIOBASILIC FISTULA CREATION;  Surgeon: Cephus Shelling, MD;  Location: [facility] OR;  Service: Vascular;  Laterality: Right;   BACK SURGERY     BASCILIC VEIN TRANSPOSITION Left  01/27/2022   Procedure: LEFT SECOND STAGE BASILIC VEIN TRANSPOSITION;  Surgeon: Cephus Shelling, MD;  Location: [facility] OR;  Service: Vascular;  Laterality: Left;   BASCILIC VEIN TRANSPOSITION Right 02/18/2023   Procedure: SECOND STAGE RIGHT ARM BASILIC VEIN TRANSPOSITION;  Surgeon: Cephus Shelling, MD;  Location: [facility] OR;  Service: Vascular;  Laterality: Right;   BIOPSY  08/20/2022   Procedure: BIOPSY;  Surgeon: Shellia Cleverly, DO;  Location: [facility] ENDOSCOPY;  Service: Gastroenterology;;   COLONOSCOPY WITH PROPOFOL N/A 08/20/2022   Procedure: COLONOSCOPY WITH PROPOFOL;  Surgeon: Shellia Cleverly, DO;  Location: [facility] ENDOSCOPY;  Service: Gastroenterology;  Laterality: N/A;   DIALYSIS/PERMA CATHETER INSERTION Right 03/05/2023   Procedure: DIALYSIS/PERMA CATHETER INSERTION;  Surgeon: Cephus Shelling, MD;  Location: [facility] INVASIVE CV LAB;  Service: Cardiovascular;  Laterality: Right;   ESOPHAGOGASTRODUODENOSCOPY (EGD) WITH PROPOFOL N/A 08/20/2022   Procedure: ESOPHAGOGASTRODUODENOSCOPY (EGD) WITH PROPOFOL;  Surgeon: Shellia Cleverly, DO;  Location: [facility] ENDOSCOPY;  Service: Gastroenterology;  Laterality: N/A;   INSERTION OF DIALYSIS CATHETER Right 05/01/2022   Procedure: INSERTION OF DIALYSIS CATHETER;  Surgeon: [name], MD;  Location: [facility] OR;  Service: Vascular;  Laterality: Right;   IR REMOVAL TUN CV CATH W/O FL  12/19/2022   LIGATION OF COMPETING BRANCHES OF ARTERIOVENOUS FISTULA Left 07/15/2021   Procedure: LIGATION OF COMPETING BRANCHES OF LEFT RADIOCEPHALIC ARTERIOVENOUS FISTULA TIMES TWO;  Surgeon: Cephus Shelling, MD;  Location[facility] OR;  Service: Vascular;  Laterality: Left;   PERIPHERAL VASCULAR INTERVENTION  06/17/2023   Procedure: PERIPHERAL VASCULAR INTERVENTION;  Surgeon: Victorino Sparrow, MD;  Location: [facility] INVASIVE CV LAB;  Service: Cardiovascular;;   POLYPECTOMY  08/20/2022   Procedure: POLYPECTOMY;  Surgeon: Shellia Cleverly, DO;  Location: [facility] ENDOSCOPY;  Service:  Gastroenterology;;   REVISON OF ARTERIOVENOUS FISTULA Left 07/15/2021   Procedure: REVISON OF LEFT ARTERIOVENOUS FISTULA;  Surgeon: Cephus Shelling, MD;  Location: [facility] OR;  Service: Vascular;  Laterality: Left;   REVISON OF ARTERIOVENOUS FISTULA Left 05/01/2022   Procedure: ARTERIOVENOUS FISTULA WASHOUT OF ARM HEMATOMA;  Surgeon: [name], MD;  Location: [facility] OR;  Service: Vascular;  Laterality: Left;     Social History:   reports that he has been smoking cigars and cigarettes. He has been exposed to tobacco smoke. He has never used smokeless tobacco. He reports current alcohol use of about 14.0 standard drinks of alcohol per week. He reports current drug use. Frequency: 1.00 time per week. Drugs: Marijuana and Cocaine.   Family History:  His family history includes Heart attack (  age of onset: 104) in his sister; Heart disease in his mother.   Allergies Allergies  Allergen Reactions   Gabapentin     Encephalopathy, tremor   Iodinated Contrast Media Nausea And Vomiting     Home Medications  Prior to Admission medications   Medication Sig Start Date End Date Taking? Authorizing Provider  albuterol (VENTOLIN HFA) 108 (90 Base) MCG/ACT inhaler Inhale 2 puffs into the lungs every 6 (six) hours as needed for wheezing or shortness of breath.   Yes [provider]  amLODipine (NORVASC) 10 MG tablet Take 1 tablet (10 mg total) by mouth daily. 01/01/18  Yes Lewayne Bunting, MD  apixaban (ELIQUIS) 5 MG TABS tablet Take 1 tablet (5 mg total) by mouth 2 (two) times daily. 03/06/23  Yes Atway, Rayann N, DO  B Complex-C-Folic Acid (DIALYVITE TABLET) TABS Take 1 tablet by mouth daily. 12/23/22  Yes [provider]  furosemide (LASIX) 40 MG tablet Take 40 mg by mouth daily.   Yes [provider]  hydrALAZINE (APRESOLINE) 25 MG tablet Take 25 mg by mouth 2 (two) times daily.   Yes [provider]  isosorbide mononitrate (IMDUR) 30 MG 24 hr tablet Take 1 tablet  (30 mg total) by mouth daily. 01/01/18  Yes Lewayne Bunting, MD  labetalol (NORMODYNE) 100 MG tablet Take 100 mg by mouth 2 (two) times daily.   Yes [provider]  lidocaine (LIDODERM) 5 % Place 1 patch onto the skin daily as needed (pain). Remove & Discard patch within 12 hours or as directed by MD   Yes [provider]  mirtazapine (REMERON) 15 MG tablet Take 7.5-15 mg by mouth at bedtime as needed (Sleep).   Yes [provider]  naloxone (NARCAN) nasal spray 4 mg/0.1 mL Place 0.4 mg into the nose See admin instructions.  SPRAY 1 SPRAY INTO ONE NOSTRIL AS DIRECTED AS NEEDED FOR RESCUE FOR OPIOID OVERDOSE - CALL 911 IMMEDIATELY, ADMINISTER DOSE, THEN TURN PERSON ON SIDE - IF NO RESPONSE IN 2-3 MINUTES OR PERSON RESPONDS BUT RELAPSES, REPEAT USING A NEW SPRAY DEVICE AND SPRAY INTO THE OTHER NOSTRIL AS NEEDED FOR RESCUE 01/21/23  Yes [provider]  omeprazole (PRILOSEC) 40 MG capsule Take 1 capsule (40 mg total) by mouth daily. 06/06/22  Yes Arnaldo Natal, NP  rosuvastatin (CRESTOR) 10 MG tablet Take 10 mg by mouth daily.   Yes [provider]  sevelamer carbonate (RENVELA) 800 MG tablet Take 1,600 mg by mouth 3 (three) times daily with meals. 01/06/23  Yes [provider]  sorbitol 70 % solution Take 10 mLs by mouth 3 (three) times daily as needed (constipation). 01/20/23  Yes [provider]  tamsulosin (FLOMAX) 0.4 MG CAPS capsule Take 0.8 mg by mouth 2 (two) times daily.   Yes [provider]  VITAMIN D, ERGOCALCIFEROL, PO Take 1 tablet by mouth daily. 03/31/23 03/29/24 Yes [provider]  carvedilol (COREG) 12.5 MG tablet Take 12.5 mg by mouth 2 (two) times daily with a meal. Patient not taking: Reported on 07/08/2023    [provider]  gabapentin (NEURONTIN) 300 MG capsule Take 300 mg by mouth 3 (three) times daily. Patient not taking: Reported on 07/08/2023    [provider]      Critical care time: 43 minutes     Joneen Roach, AGACNP-BC [facility]  See Amion for personal pager PCCM on call pager [phone] until 7pm. Please call Elink 7p-7a.  [phone]  07/08/2023 1:30 PM

## 2023-07-08 NOTE — ED Notes (Signed)
RT at bedside.

## 2023-07-08 NOTE — Progress Notes (Signed)
Pt has PRN BIPAP orders, no distress noted at this time. Bipap on standby in pt's room in needed.

## 2023-07-08 NOTE — Progress Notes (Signed)
Patient requested to be taken off of dialysis treatment due to cramps. Rinse back started.

## 2023-07-09 LAB — HEPATITIS B SURFACE ANTIBODY, QUANTITATIVE: Hep B S AB Quant (Post): 12.2 m[IU]/mL

## 2023-07-09 NOTE — Discharge Planning (Signed)
[facility] Patient Discharge Orders- [facility] CLINIC: [facility]  Patient's name: Ronald Reeves Admit/DC Dates: 07/08/2023 - 07/08/2023  Patient left AMA   Discharge Diagnoses: Resp failure/Pulm edema 2/2 missed dialysis  Malfunctioning AVF - needs [facility]    Recent Labs  Lab 07/08/23 0317 07/08/23 0358  HGB 12.4* 14.3  K 5.2* 5.0  CALCIUM 9.6  --   ALBUMIN 3.5  --     Aranesp: Given: --   Date of last dose/amount: --   PRBC's Given: - Date/# of units: -- ESA for discharge: --none   Outpatient Dialysis Orders:  -Heparin: no change -EDW no change  -[place]: no change   Access intervention/Change:  Left AMA prior to intervention   IV Antibiotics:  --   OTHER/APPTS/LAB ORDERS    Completed by: Tomasa Blase PA-C   D/C Meds to be reconciled by nurse after every discharge.    Reviewed by: MD:______ RN_______

## 2023-07-09 NOTE — Progress Notes (Signed)
Notified by nurse tech and nurse secretary patient was requesting to leave hospital. Upon entering the room patient was removing patient care equipment and getting out of bed despite myself and nurse tech asking he leave his equipment on for monitoring and allow [place] to assist him with whatever he needs. The patient then began raising his voice and stated "I am a grown ass man, I am not a damn baby. Get me my clothes and call my wife I am going home." At this point I asked the nurse tech to please stand by the door and I distanced myself from the patients personal space. I then educated the patient on the risks of leaving and he stated "he knows this isn't the first time". I then called the patients wife and ask if she would speak with him he informed her he "did not care he wanted to go." I provided the same information to his wife regarding follow up appointment after discharge, that he would not have a working dialysis access site, and should he start to feel unwell for any reason to please come back to the hospital. I informed them I would need to let his doctor know and I pressed the in room button for Elink.   Upon Dr. Warrick Parisian cameraing in things began to escalate as Dr. Warrick Parisian informed the patient of extreme risks of leaving and he recommended he stay in the hospital. The patient began to yell "get me my fucking clothes I am leaving. If you keep talking to me I am going to hit this nurse." Patients wife still on the phone and asked the patient to calm down. Dr. Warrick Parisian told the patient he was going to call his daughter and tell her to talk to him. The patient got even louder and yelled "I do not care I am her father, if you keep pushing me I am going to get violent." At this point I stepped out of the room and asked my charge nurse to notify the ground team and security. Dr. Warrick Parisian then called the patients daughter and the patient then stood up back up out of his bed. I expressed my concern to Dr. Warrick Parisian at this time  that we no longer purse conversations in fear of escalating the situation further. Dr. Warrick Parisian agreed, I then presented the patient with [facility] paper which he and I signed.   Cloyd Stagers arrived to bedside patient was much calmer at this point but was still set on leaving, at this time we removed the patients trialysis catheter and iv and provided education again on care of the site and to return to the hospital should he start to feel unwell. Wife arrived at bedside and confirmed with the patient he wanted to leave and he stated "yes". Wife provided with same discharge instructions. Wife wheeled patient off of the unit.

## 2023-07-10 ENCOUNTER — Inpatient Hospital Stay (HOSPITAL_COMMUNITY): Payer: No Typology Code available for payment source

## 2023-07-10 ENCOUNTER — Inpatient Hospital Stay (HOSPITAL_COMMUNITY): Payer: No Typology Code available for payment source | Admitting: Anesthesiology

## 2023-07-10 ENCOUNTER — Ambulatory Visit (HOSPITAL_COMMUNITY)
Admission: RE | Admit: 2023-07-10 | Discharge: 2023-07-10 | Disposition: A | Discharge status: Home / Self Care | Payer: No Typology Code available for payment source | Source: Ambulatory Visit | Attending: Vascular Surgery | Admitting: Vascular Surgery

## 2023-07-10 ENCOUNTER — Other Ambulatory Visit: Payer: Self-pay

## 2023-07-10 ENCOUNTER — Encounter (HOSPITAL_COMMUNITY)
Admission: RE | Disposition: A | Discharge status: Home / Self Care | Payer: Self-pay | Source: Ambulatory Visit | Attending: Vascular Surgery

## 2023-07-10 ENCOUNTER — Encounter (HOSPITAL_COMMUNITY): Payer: Self-pay | Admitting: Vascular Surgery

## 2023-07-10 DIAGNOSIS — N186 End stage renal disease: Secondary | ICD-10-CM

## 2023-07-10 DIAGNOSIS — D500 Iron deficiency anemia secondary to blood loss (chronic): Secondary | ICD-10-CM

## 2023-07-10 DIAGNOSIS — F17200 Nicotine dependence, unspecified, uncomplicated: Secondary | ICD-10-CM | POA: Diagnosis not present

## 2023-07-10 DIAGNOSIS — Z992 Dependence on renal dialysis: Secondary | ICD-10-CM | POA: Diagnosis not present

## 2023-07-10 DIAGNOSIS — R0602 Shortness of breath: Secondary | ICD-10-CM | POA: Diagnosis not present

## 2023-07-10 DIAGNOSIS — Z9981 Dependence on supplemental oxygen: Secondary | ICD-10-CM | POA: Diagnosis not present

## 2023-07-10 DIAGNOSIS — I132 Hypertensive heart and chronic kidney disease with heart failure and with stage 5 chronic kidney disease, or end stage renal disease: Secondary | ICD-10-CM | POA: Diagnosis not present

## 2023-07-10 DIAGNOSIS — F32A Depression, unspecified: Secondary | ICD-10-CM | POA: Insufficient documentation

## 2023-07-10 DIAGNOSIS — M1990 Unspecified osteoarthritis, unspecified site: Secondary | ICD-10-CM | POA: Diagnosis not present

## 2023-07-10 DIAGNOSIS — T82898A Other specified complication of vascular prosthetic devices, implants and grafts, initial encounter: Secondary | ICD-10-CM | POA: Diagnosis not present

## 2023-07-10 DIAGNOSIS — I252 Old myocardial infarction: Secondary | ICD-10-CM | POA: Diagnosis not present

## 2023-07-10 DIAGNOSIS — J449 Chronic obstructive pulmonary disease, unspecified: Secondary | ICD-10-CM | POA: Diagnosis not present

## 2023-07-10 DIAGNOSIS — D631 Anemia in chronic kidney disease: Secondary | ICD-10-CM | POA: Diagnosis not present

## 2023-07-10 DIAGNOSIS — I871 Compression of vein: Secondary | ICD-10-CM

## 2023-07-10 DIAGNOSIS — Z9889 Other specified postprocedural states: Secondary | ICD-10-CM

## 2023-07-10 DIAGNOSIS — X58XXXA Exposure to other specified factors, initial encounter: Secondary | ICD-10-CM | POA: Insufficient documentation

## 2023-07-10 DIAGNOSIS — I509 Heart failure, unspecified: Secondary | ICD-10-CM | POA: Diagnosis not present

## 2023-07-10 DIAGNOSIS — I2510 Atherosclerotic heart disease of native coronary artery without angina pectoris: Secondary | ICD-10-CM | POA: Diagnosis not present

## 2023-07-10 HISTORY — PX: INSERTION OF DIALYSIS CATHETER: SHX1324

## 2023-07-10 LAB — POCT I-STAT, CHEM 8
BUN: 53 mg/dL — ABNORMAL HIGH (ref 8–23)
Calcium, Ion: 1.09 mmol/L — ABNORMAL LOW (ref 1.15–1.40)
Chloride: 107 mmol/L (ref 98–111)
Creatinine, Ser: 9.3 mg/dL — ABNORMAL HIGH (ref 0.61–1.24)
Glucose, Bld: 118 mg/dL — ABNORMAL HIGH (ref 70–99)
HCT: 36 % — ABNORMAL LOW (ref 39.0–52.0)
Hemoglobin: 12.2 g/dL — ABNORMAL LOW (ref 13.0–17.0)
Potassium: 4.4 mmol/L (ref 3.5–5.1)
Sodium: 140 mmol/L (ref 135–145)
TCO2: 23 mmol/L (ref 22–32)

## 2023-07-10 SURGERY — INSERTION OF DIALYSIS CATHETER
Anesthesia: General | Site: Groin | Laterality: Right

## 2023-07-10 MED ORDER — ONDANSETRON HCL 4 MG/2ML IJ SOLN
INTRAMUSCULAR | Status: DC | PRN
  Administered 2023-07-10: 4 mg via INTRAVENOUS

## 2023-07-10 MED ORDER — 0.9 % SODIUM CHLORIDE (POUR BTL) OPTIME
TOPICAL | Status: DC | PRN
  Administered 2023-07-10: 1000 mL

## 2023-07-10 MED ORDER — CEFAZOLIN SODIUM-DEXTROSE 2-4 GM/100ML-% IV SOLN
INTRAVENOUS | Status: AC
  Filled 2023-07-10: qty 100

## 2023-07-10 MED ORDER — ALBUTEROL SULFATE (2.5 MG/3ML) 0.083% IN NEBU
2.5000 mg | INHALATION_SOLUTION | Freq: Four times a day (QID) | RESPIRATORY_TRACT | Status: DC | PRN
  Administered 2023-07-10: 2.5 mg via RESPIRATORY_TRACT

## 2023-07-10 MED ORDER — LIDOCAINE 2% (20 MG/ML) 5 ML SYRINGE
INTRAMUSCULAR | Status: DC | PRN
  Administered 2023-07-10: 100 mg via INTRAVENOUS

## 2023-07-10 MED ORDER — FENTANYL CITRATE (PF) 250 MCG/5ML IJ SOLN
INTRAMUSCULAR | Status: AC
  Filled 2023-07-10: qty 5

## 2023-07-10 MED ORDER — ORAL CARE MOUTH RINSE: 15.0000 mL | Freq: Once | OROMUCOSAL | Status: AC

## 2023-07-10 MED ORDER — DEXAMETHASONE SODIUM PHOSPHATE 10 MG/ML IJ SOLN
INTRAMUSCULAR | Status: DC | PRN
  Administered 2023-07-10: 4 mg via INTRAVENOUS

## 2023-07-10 MED ORDER — HEPARIN SODIUM (PORCINE) 1000 UNIT/ML IJ SOLN
INTRAMUSCULAR | Status: DC | PRN
  Administered 2023-07-10: 6200 [IU]

## 2023-07-10 MED ORDER — CHLORHEXIDINE GLUCONATE 0.12 % MT SOLN
OROMUCOSAL | Status: AC
  Administered 2023-07-10: 15 mL via OROMUCOSAL
  Filled 2023-07-10: qty 15

## 2023-07-10 MED ORDER — HEPARIN 6000 UNIT IRRIGATION SOLUTION
Status: DC | PRN
  Administered 2023-07-10: 1

## 2023-07-10 MED ORDER — PHENYLEPHRINE 80 MCG/ML (10ML) SYRINGE FOR IV PUSH (FOR BLOOD PRESSURE SUPPORT)
PREFILLED_SYRINGE | INTRAVENOUS | Status: DC | PRN
  Administered 2023-07-10: 160 ug via INTRAVENOUS
  Administered 2023-07-10: 80 ug via INTRAVENOUS
  Administered 2023-07-10: 160 ug via INTRAVENOUS

## 2023-07-10 MED ORDER — CEFAZOLIN SODIUM-DEXTROSE 2-3 GM-%(50ML) IV SOLR
INTRAVENOUS | Status: DC | PRN
Start: 2023-07-10 — End: 2023-07-10
  Administered 2023-07-10: 2 g via INTRAVENOUS

## 2023-07-10 MED ORDER — PHENYLEPHRINE HCL-NACL 20-0.9 MG/250ML-% IV SOLN
INTRAVENOUS | Status: DC | PRN
  Administered 2023-07-10: 40 ug/min via INTRAVENOUS

## 2023-07-10 MED ORDER — OXYCODONE HCL 5 MG/5ML PO SOLN: 5.0000 mg | Freq: Once | ORAL | Status: AC | PRN

## 2023-07-10 MED ORDER — ALBUTEROL SULFATE (2.5 MG/3ML) 0.083% IN NEBU
INHALATION_SOLUTION | RESPIRATORY_TRACT | Status: AC
  Filled 2023-07-10: qty 3

## 2023-07-10 MED ORDER — LABETALOL HCL 100 MG PO TABS
100.0000 mg | ORAL_TABLET | Freq: Once | ORAL | Status: AC
  Administered 2023-07-10: 100 mg via ORAL
  Filled 2023-07-10: qty 1

## 2023-07-10 MED ORDER — ALBUMIN HUMAN 5 % IV SOLN: INTRAVENOUS | Status: DC | PRN

## 2023-07-10 MED ORDER — ACETAMINOPHEN 10 MG/ML IV SOLN: 1000.0000 mg | Freq: Once | INTRAVENOUS | Status: DC | PRN

## 2023-07-10 MED ORDER — ONDANSETRON HCL 4 MG/2ML IJ SOLN
INTRAMUSCULAR | Status: AC
  Filled 2023-07-10: qty 2

## 2023-07-10 MED ORDER — PROPOFOL 10 MG/ML IV BOLUS
INTRAVENOUS | Status: DC | PRN
  Administered 2023-07-10: 100 mg via INTRAVENOUS

## 2023-07-10 MED ORDER — FENTANYL CITRATE (PF) 100 MCG/2ML IJ SOLN: 25.0000 ug | INTRAMUSCULAR | Status: DC | PRN

## 2023-07-10 MED ORDER — FENTANYL CITRATE (PF) 250 MCG/5ML IJ SOLN
INTRAMUSCULAR | Status: DC | PRN
  Administered 2023-07-10: 50 ug via INTRAVENOUS

## 2023-07-10 MED ORDER — OXYCODONE HCL 5 MG PO TABS
5.0000 mg | ORAL_TABLET | Freq: Once | ORAL | Status: AC | PRN
  Administered 2023-07-10: 5 mg via ORAL

## 2023-07-10 MED ORDER — ROCURONIUM BROMIDE 10 MG/ML (PF) SYRINGE
PREFILLED_SYRINGE | INTRAVENOUS | Status: DC | PRN
  Administered 2023-07-10: 70 mg via INTRAVENOUS

## 2023-07-10 MED ORDER — SUGAMMADEX SODIUM 200 MG/2ML IV SOLN
INTRAVENOUS | Status: DC | PRN
  Administered 2023-07-10 (×2): 200 mg via INTRAVENOUS

## 2023-07-10 MED ORDER — SODIUM CHLORIDE 0.9 % IV SOLN
INTRAVENOUS | Status: DC
Start: 2023-07-10 — End: 2023-07-11

## 2023-07-10 MED ORDER — CHLORHEXIDINE GLUCONATE 0.12 % MT SOLN: 15.0000 mL | Freq: Once | OROMUCOSAL | Status: AC

## 2023-07-10 MED ORDER — PROPOFOL 10 MG/ML IV BOLUS
INTRAVENOUS | Status: AC
  Filled 2023-07-10: qty 20

## 2023-07-10 MED ORDER — OXYCODONE HCL 5 MG PO TABS
ORAL_TABLET | ORAL | Status: AC
  Filled 2023-07-10: qty 1

## 2023-07-10 MED ORDER — OXYCODONE HCL 5 MG PO TABS: 5.0000 mg | ORAL_TABLET | Freq: Four times a day (QID) | ORAL | 0 refills | Status: DC | PRN

## 2023-07-10 MED ORDER — HEPARIN SODIUM (PORCINE) 1000 UNIT/ML IJ SOLN
INTRAMUSCULAR | Status: AC
Start: 2023-07-10 — End: ?
  Filled 2023-07-10: qty 10

## 2023-07-10 MED ORDER — ONDANSETRON HCL 4 MG/2ML IJ SOLN
4.0000 mg | Freq: Once | INTRAMUSCULAR | Status: AC | PRN
  Administered 2023-07-10: 4 mg via INTRAVENOUS

## 2023-07-10 MED ORDER — HEPARIN 6000 UNIT IRRIGATION SOLUTION
Status: AC
  Filled 2023-07-10: qty 500

## 2023-07-10 MED ORDER — LIDOCAINE HCL (PF) 1 % IJ SOLN
INTRAMUSCULAR | Status: AC
  Filled 2023-07-10: qty 30

## 2023-07-10 SURGICAL SUPPLY — 40 items
BAG COUNTER SPONGE SURGICOUNT (BAG) ×1 IMPLANT
BAG DECANTER FOR FLEXI CONT (MISCELLANEOUS) ×1 IMPLANT
BIOPATCH RED 1 DISK 7.0 (GAUZE/BANDAGES/DRESSINGS) ×1 IMPLANT
BLADE SURG 11 STRL SS (BLADE) IMPLANT
CATH STRAIGHT 5FR 65CM (CATHETERS) IMPLANT
COVER PROBE W GEL 5X96 (DRAPES) ×1 IMPLANT
COVER SURGICAL LIGHT HANDLE (MISCELLANEOUS) ×1 IMPLANT
DERMABOND ADVANCED .7 DNX12 (GAUZE/BANDAGES/DRESSINGS) ×1 IMPLANT
DRAPE C-ARM 42X72 X-RAY (DRAPES) ×1 IMPLANT
DRAPE CHEST BREAST 15X10 FENES (DRAPES) ×1 IMPLANT
DRAPE SURG ORHT 6 SPLT 77X108 (DRAPES) IMPLANT
GAUZE 4X4 16PLY ~~LOC~~+RFID DBL (SPONGE) ×1 IMPLANT
GLIDEWIRE ADV .035X180CM (WIRE) IMPLANT
GLOVE BIO SURGEON STRL SZ7.5 (GLOVE) ×1 IMPLANT
GLOVE BIOGEL PI IND STRL 8 (GLOVE) ×1 IMPLANT
GOWN STRL REUS W/ TWL LRG LVL3 (GOWN DISPOSABLE) ×2 IMPLANT
GOWN STRL REUS W/ TWL XL LVL3 (GOWN DISPOSABLE) ×2 IMPLANT
KIT BASIN OR (CUSTOM PROCEDURE TRAY) ×1 IMPLANT
KIT PALINDROME-P 55CM (CATHETERS) IMPLANT
KIT TURNOVER KIT B (KITS) ×1 IMPLANT
NDL 18GX1X1/2 (RX/OR ONLY) (NEEDLE) ×1 IMPLANT
NDL HYPO 25GX1X1/2 BEV (NEEDLE) ×1 IMPLANT
NEEDLE 18GX1X1/2 (RX/OR ONLY) (NEEDLE) ×1 IMPLANT
NEEDLE HYPO 25GX1X1/2 BEV (NEEDLE) ×1 IMPLANT
NS IRRIG 1000ML POUR BTL (IV SOLUTION) ×1 IMPLANT
PACK SRG BSC III STRL LF ECLPS (CUSTOM PROCEDURE TRAY) ×1 IMPLANT
PAD ARMBOARD 7.5X6 YLW CONV (MISCELLANEOUS) ×2 IMPLANT
SET MICROPUNCTURE 5F STIFF (MISCELLANEOUS) IMPLANT
SOAP 2 % CHG 4 OZ (WOUND CARE) ×1 IMPLANT
SPIKE FLUID TRANSFER (MISCELLANEOUS) ×1 IMPLANT
SUT ETHILON 3 0 PS 1 (SUTURE) ×1 IMPLANT
SUT MNCRL AB 4-0 PS2 18 (SUTURE) ×1 IMPLANT
SYR 10ML LL (SYRINGE) ×1 IMPLANT
SYR 20ML LL LF (SYRINGE) ×2 IMPLANT
SYR 5ML LL (SYRINGE) ×1 IMPLANT
SYR CONTROL 10ML LL (SYRINGE) ×1 IMPLANT
TOWEL GREEN STERILE (TOWEL DISPOSABLE) ×1 IMPLANT
TOWEL GREEN STERILE FF (TOWEL DISPOSABLE) ×1 IMPLANT
WATER STERILE IRR 1000ML POUR (IV SOLUTION) ×1 IMPLANT
WIRE AMPLATZ SS-J .035X180CM (WIRE) IMPLANT

## 2023-07-10 NOTE — Progress Notes (Signed)
LVM regarding if the Ronald Reeves will show up for his scheduled surgery with Dr. Chestine Spore now for a 0730 start.

## 2023-07-10 NOTE — Anesthesia Preprocedure Evaluation (Signed)
Anesthesia Evaluation  Patient identified by MRN, date of birth, ID band Patient awake    Reviewed: Allergy & Precautions, NPO status , Patient's Chart, lab work & pertinent test results, reviewed documented beta blocker date and time   History of Anesthesia Complications Negative for: history of anesthetic complications  Airway Mallampati: II  TM Distance: >3 FB Neck ROM: Limited    Dental  (+) Poor Dentition   Pulmonary shortness of breath, pneumonia, COPD,  COPD inhaler and oxygen dependent, Current Smoker and Patient abstained from smoking.    + decreased breath sounds      Cardiovascular hypertension, + CAD, + Past MI and +CHF  (-) CABG + Valvular Problems/Murmurs  Rhythm:Regular Rate:Normal  EF 30-35%   Neuro/Psych neg Seizures PSYCHIATRIC DISORDERS  Depression     Neuromuscular disease    GI/Hepatic ,GERD  ,,(+) neg Cirrhosis        Endo/Other    Renal/GU ESRF and DialysisRenal disease     Musculoskeletal  (+) Arthritis ,    Abdominal   Peds  Hematology  (+) Blood dyscrasia, anemia   Anesthesia Other Findings   Reproductive/Obstetrics                              Anesthesia Physical Anesthesia Plan  ASA: 3  Anesthesia Plan: General   Post-op Pain Management:    Induction: Intravenous  PONV Risk Score and Plan: 2 and Ondansetron and Dexamethasone  Airway Management Planned: Oral ETT  Additional Equipment:   Intra-op Plan:   Post-operative Plan: Extubation in OR  Informed Consent: I have reviewed the patients History and Physical, chart, labs and discussed the procedure including the risks, benefits and alternatives for the proposed anesthesia with the patient or authorized representative who has indicated his/her understanding and acceptance.     Dental advisory given  Plan Discussed with: CRNA  Anesthesia Plan Comments:          Anesthesia Quick  Evaluation

## 2023-07-10 NOTE — H&P (View-Only) (Signed)
 Patient has a right femoral tunneled dialysis catheter placed today for immediate dialysis needs.  He left AMA several days ago from the ICU after missing dialysis.  His right arm fistula is pulsatile but has a thrill.  I will arrange outpatient fistulogram in the Cath Lab to see if this can be salvaged.  Cephus Shelling, MD [facility] Office: [phone]   Cephus Shelling

## 2023-07-10 NOTE — H&P (Signed)
History and Physical Interval Note:  07/10/2023 7:18 AM  Ronald Reeves  has presented today for surgery, with the diagnosis of ESRD.  The various methods of treatment have been discussed with the patient and family. After consideration of risks, benefits and other options for treatment, the patient has consented to  Procedure(s): INSERTION OF TUNNELED  DIALYSIS CATHETER (N/A) as a surgical intervention.  The patient's history has been reviewed, patient examined, no change in status, stable for surgery.  I have reviewed the patient's chart and labs.  Questions were answered to the patient's satisfaction.    TDC and has been femoral in the past  Ronald Reeves  [facility] [place]     Ronald Reeves is a 67 y.o. year old male who presents s/p right second stage basilic vein transposition (brachiobasilic arteriovenous fistula) placement 02/18/23 by Dr. Chestine Spore. He was having prolonged bleeding so Dr. Karin Lieu performed fistulogram with angioplasty of the mid fistula on 06/17/23.  He was admitted yesterday and HD was attempted this am.  Unable to perform HD.  Pulsitile flow in the fistula.  We have been asked to place [facility].  Last [facility] was placed by Dr. Chestine Spore on 03/05/23 right femoral with previous femora cathter that patient pulled out accidentally the day prior.  He ate at 7am this morning and missed HD Tuesday.   History of Drugs: alcohol, Marijuana and Cocaine    03/12/17 left AV fistula 07/15/21 ligation of branches left RC av fistula 10/18/21 Brachiobasilic fistula creation, second stage  05/01/22 hematoma in left arm after traumatic cannulation for dialysis, placement of right internal jugular TDC and hematoma evacuation 11/28/22  right first stage basilic vein transposition (brachiobasilic arteriovenous fistula) placement  02/18/23 second stage right UE basilic fistula 5/57/32 replacement of right femoral TDC ( I'm not sure who placed the first right femoral TDC)               Findings: Greater than 80% tapering stenosis at the mid fistula spanning 15 cm No inflow stenosis 50% stenosis at the costoclavicular junction   Positive Cocaine & Cannabis      Objective (!) 152/92 91 98.2 F (36.8 C) (!) 28 99% No intake or output data in the 24 hours ending 07/08/23 0841   Right UE pulsatile thrill to palpation and Dialysis lab was unable to stick for HD.   Right UE with palpable radial pulse, grip 5/5,, no symptoms of steal.      Assessment/Planning: Malfunctioning right UE AV fistula History of prolonged bleeding with angioplasty recently now unable to cannulate for HD.  Asked to provide [facility].   NPO he last ate today at 7 am and has not had HD since Saturday and is SOB Ronald Reeves will discuss timing of [facility] placement with patient.   Chest  X ray 07/08/23 IMPRESSION: Bilateral mid and lower lung interstitial and airspace opacities, favor edema though infection could appear similarly.   He will need to be admitted to medicine service.  He was sent back to the ED   Ronald Reeves 07/08/2023 8:41 AM --   Laboratory Lab Results: Recent Labs (last 2 labs)      Recent Labs    07/08/23 0317 07/08/23 0358  WBC 5.8  --   HGB 12.4* 14.3  HCT 38.8* 42.0  PLT 169  --       BMET Recent Labs (last 2 labs)      Recent Labs    07/08/23 0317 07/08/23  0358  NA 140 140  K 5.2* 5.0  CL 105 106  CO2 20*  --   GLUCOSE 116* 111*  BUN 66* 58*  CREATININE 9.39* 9.50*  CALCIUM 9.6  --         COAG Recent Labs       Lab Results  Component Value Date    INR 1.3 (H) 03/18/2023    INR 1.2 03/13/2023    INR 1.1 12/16/2022      Recent Labs  No results found for: "PTT"       VASCULAR STAFF ADDENDUM: I have independently interviewed and examined the patient. I agree with the above.  Plan [facility] with one of my partners Friday.   Ronald Brunt. Lenell Antu, MD [facility] Phone Number: [phone] 07/08/2023 3:40 PM

## 2023-07-10 NOTE — Progress Notes (Signed)
After two left messages, the wife [name] Sanluis called back to inform that someone called them yesterday and told them to arrive at 0630. The wife said that they could be here in 30 minutes.

## 2023-07-10 NOTE — Op Note (Signed)
Date: July 10, 2023  Preoperative diagnosis: End-stage renal disease  Postoperative diagnosis: Same  Procedure: 1.  Ultrasound-guided access right common femoral vein 2.  Placement of right common femoral vein tunneled dialysis catheter with tip in the IVC (55 cm palindrome)  Surgeon: Dr. Cephus Shelling, MD  Assistant: OR staff  Indications: 67 year old male with end-stage renal disease that was admitted several days ago as they could not use his right arm AV fistula.  He had a temporary right femoral line placed in the ICU.  He left AMA.  He returns today for tunneled dialysis catheter placement.  Risk benefits discussed.  Findings: I evaluated the right and left necks and the IJ appeared occluded on both sides.  Selected a right femoral tunneled dialysis catheter using a 55 cm palindrome with tip in the IVC.  This flushed and aspirated easily.  Anesthesia: General  Details: Patient was taken to the operating room after informed consent was obtained.  Placed on the operative table in supine position.  After anesthesia induced the bilateral necks and both groins were prepped and draped standard sterile fashion.  A timeout was performed and antibiotics were given.  Initially used the sterile ultrasound probe and evaluated both necks where it appeared that his IJ's were occluded.  I then elected to place a femoral line which is what he had in the past.  I evaluated the right common femoral vein, it was patent, an image was saved.  This was accessed with micro access needle and placed a microwire and a microsheath.  I then advanced a J-wire although this would not go so used a Glidewire advantage that went into the IVC under fluoroscopy.  I then selected 55 cm palindrome catheter that was measured.  I made a counterincision on the thigh and tunneled the catheter making sure to bury the cuff under the skin.  I then dilated over the wire and the common femoral vein and placed a large dilator  peel-away sheath in the right common femoral vein.  The wire and dilator were removed.  I then advanced the catheter through the sheath and the sheath was peeled away making sure the catheter was in the IVC.  It flushed and aspirated easily.  Catheter was secured with multiple nylons as well as the femoral vein stick site was closed with a 4-0 Monocryl.  Dermabond was applied.  Catheter was loaded loaded according manufactures recommendations.  Complication: None  Condition: Stable  Cephus Shelling, MD [facility] [place] Office: [phone]   Cephus Shelling

## 2023-07-10 NOTE — Transfer of Care (Signed)
Immediate Anesthesia Transfer of Care Note  Patient: Ronald Reeves  Procedure(s) Performed: Alpha Gula GUIDED INSERTION OF TUNNELED  DIALYSIS CATHETER TO RIGHT FEMORAL VEIN (Right: Groin)  Patient Location: PACU  Anesthesia Type:General  Level of Consciousness: awake and alert   Airway & Oxygen Therapy: Patient Spontanous Breathing and Patient connected to nasal cannula oxygen  Post-op Assessment: Report given to RN and Post -op Vital signs reviewed and stable  Post vital signs: Reviewed and stable  Last Vitals:  Vitals Value Taken Time  BP 136/71 07/10/23 0838  Temp    Pulse 66 07/10/23 0840  Resp 14 07/10/23 0840  SpO2 100 % 07/10/23 0840  Vitals shown include unfiled device data.  Last Pain:  Vitals:   07/10/23 0642  PainSc: 0-No pain         Complications: No notable events documented.

## 2023-07-10 NOTE — Progress Notes (Signed)
Patient has a right femoral tunneled dialysis catheter placed today for immediate dialysis needs.  He left AMA several days ago from the ICU after missing dialysis.  His right arm fistula is pulsatile but has a thrill.  I will arrange outpatient fistulogram in the Cath Lab to see if this can be salvaged.  Cephus Shelling, MD [facility] [place] Office: [phone]   Cephus Shelling

## 2023-07-10 NOTE — Anesthesia Procedure Notes (Signed)
Procedure Name: Intubation Date/Time: 07/10/2023 7:48 AM  Performed by: Sandie Ano, CRNAPre-anesthesia Checklist: Patient identified, Emergency Drugs available, Suction available and Patient being monitored Patient Re-evaluated:Patient Re-evaluated prior to induction Oxygen Delivery Method: Circle System Utilized Preoxygenation: Pre-oxygenation with 100% oxygen Induction Type: IV induction Ventilation: Mask ventilation without difficulty Laryngoscope Size: Mac and 3 Grade View: Grade I Tube type: Oral Tube size: 7.0 mm Number of attempts: 1 Airway Equipment and Method: Stylet and Oral airway Placement Confirmation: ETT inserted through vocal cords under direct vision, positive ETCO2 and breath sounds checked- equal and bilateral Secured at: 21 cm Tube secured with: Tape Dental Injury: Teeth and Oropharynx as per pre-operative assessment

## 2023-07-10 NOTE — Anesthesia Postprocedure Evaluation (Signed)
Anesthesia Post Note  Patient: Treysen Ottum  Procedure(s) Performed: Alpha Gula GUIDED INSERTION OF TUNNELED  DIALYSIS CATHETER TO RIGHT FEMORAL VEIN (Right: Groin)     Patient location during evaluation: PACU Anesthesia Type: General Level of consciousness: awake and alert Pain management: pain level controlled Vital Signs Assessment: post-procedure vital signs reviewed and stable Respiratory status: spontaneous breathing, nonlabored ventilation, respiratory function stable and patient connected to nasal cannula oxygen Cardiovascular status: blood pressure returned to baseline and stable Postop Assessment: no apparent nausea or vomiting Anesthetic complications: no   No notable events documented.  Last Vitals:  Vitals:   07/10/23 0900 07/10/23 0915  BP: 136/78 133/75  Pulse: 67 69  Resp: 15 (!) 23  Temp:  (!) 36.1 C  SpO2: 100% 100%    Last Pain:  Vitals:   07/10/23 0915  PainSc: 3                  Mariann Barter

## 2023-07-11 ENCOUNTER — Encounter (HOSPITAL_COMMUNITY): Payer: Self-pay | Admitting: Vascular Surgery

## 2023-07-14 ENCOUNTER — Other Ambulatory Visit: Payer: Self-pay

## 2023-07-14 DIAGNOSIS — N186 End stage renal disease: Secondary | ICD-10-CM

## 2023-07-14 MED ORDER — SODIUM CHLORIDE 0.9 % IV SOLN: 250.0000 mL | INTRAVENOUS | Status: AC | PRN

## 2023-07-14 MED ORDER — SODIUM CHLORIDE 0.9% FLUSH: 3.0000 mL | INTRAVENOUS | Status: DC | PRN

## 2023-07-17 ENCOUNTER — Ambulatory Visit (HOSPITAL_COMMUNITY)
Admission: RE | Admit: 2023-07-17 | Discharge: 2023-07-17 | Disposition: A | Discharge status: Home / Self Care | Payer: No Typology Code available for payment source | Attending: Vascular Surgery | Admitting: Vascular Surgery

## 2023-07-17 ENCOUNTER — Encounter (HOSPITAL_COMMUNITY): Payer: Self-pay | Admitting: Vascular Surgery

## 2023-07-17 ENCOUNTER — Other Ambulatory Visit: Payer: Self-pay

## 2023-07-17 ENCOUNTER — Encounter (HOSPITAL_COMMUNITY)
Admission: RE | Disposition: A | Discharge status: Home / Self Care | Payer: Self-pay | Source: Home / Self Care | Attending: Vascular Surgery

## 2023-07-17 DIAGNOSIS — Z992 Dependence on renal dialysis: Secondary | ICD-10-CM | POA: Insufficient documentation

## 2023-07-17 DIAGNOSIS — N185 Chronic kidney disease, stage 5: Secondary | ICD-10-CM | POA: Diagnosis not present

## 2023-07-17 DIAGNOSIS — Y832 Surgical operation with anastomosis, bypass or graft as the cause of abnormal reaction of the patient, or of later complication, without mention of misadventure at the time of the procedure: Secondary | ICD-10-CM | POA: Diagnosis not present

## 2023-07-17 DIAGNOSIS — T82858A Stenosis of vascular prosthetic devices, implants and grafts, initial encounter: Secondary | ICD-10-CM | POA: Diagnosis present

## 2023-07-17 DIAGNOSIS — N186 End stage renal disease: Secondary | ICD-10-CM | POA: Diagnosis not present

## 2023-07-17 DIAGNOSIS — T82898A Other specified complication of vascular prosthetic devices, implants and grafts, initial encounter: Secondary | ICD-10-CM

## 2023-07-17 HISTORY — PX: A/V FISTULAGRAM: CATH118298

## 2023-07-17 HISTORY — PX: PERIPHERAL VASCULAR BALLOON ANGIOPLASTY: CATH118281

## 2023-07-17 LAB — POCT I-STAT, CHEM 8
BUN: 52 mg/dL — ABNORMAL HIGH (ref 8–23)
Calcium, Ion: 1.19 mmol/L (ref 1.15–1.40)
Chloride: 102 mmol/L (ref 98–111)
Creatinine, Ser: 9.1 mg/dL — ABNORMAL HIGH (ref 0.61–1.24)
Glucose, Bld: 103 mg/dL — ABNORMAL HIGH (ref 70–99)
HCT: 38 % — ABNORMAL LOW (ref 39.0–52.0)
Hemoglobin: 12.9 g/dL — ABNORMAL LOW (ref 13.0–17.0)
Potassium: 4.4 mmol/L (ref 3.5–5.1)
Sodium: 142 mmol/L (ref 135–145)
TCO2: 26 mmol/L (ref 22–32)

## 2023-07-17 LAB — GLUCOSE, CAPILLARY: Glucose-Capillary: 102 mg/dL — ABNORMAL HIGH (ref 70–99)

## 2023-07-17 SURGERY — A/V FISTULAGRAM
Anesthesia: LOCAL | Laterality: Right

## 2023-07-17 MED ORDER — METHYLPREDNISOLONE SODIUM SUCC 125 MG IJ SOLR
125.0000 mg | INTRAMUSCULAR | Status: AC
  Administered 2023-07-17: 125 mg via INTRAVENOUS
  Filled 2023-07-17: qty 2

## 2023-07-17 MED ORDER — SODIUM CHLORIDE 0.9% FLUSH: 3.0000 mL | Freq: Two times a day (BID) | INTRAVENOUS | Status: DC

## 2023-07-17 MED ORDER — LIDOCAINE HCL (PF) 1 % IJ SOLN
INTRAMUSCULAR | Status: AC
  Filled 2023-07-17: qty 30

## 2023-07-17 MED ORDER — LIDOCAINE HCL (PF) 1 % IJ SOLN
INTRAMUSCULAR | Status: DC | PRN
  Administered 2023-07-17: 5 mL via INTRADERMAL

## 2023-07-17 MED ORDER — HEPARIN (PORCINE) IN NACL 1000-0.9 UT/500ML-% IV SOLN
INTRAVENOUS | Status: DC | PRN
  Administered 2023-07-17: 500 mL

## 2023-07-17 MED ORDER — DIPHENHYDRAMINE HCL 50 MG/ML IJ SOLN
25.0000 mg | INTRAMUSCULAR | Status: AC
  Administered 2023-07-17: 25 mg via INTRAVENOUS
  Filled 2023-07-17: qty 1

## 2023-07-17 SURGICAL SUPPLY — 19 items
BAG SNAP BAND KOVER 36X36 (MISCELLANEOUS) ×2 IMPLANT
BALLN ATLAS 14X40X75 (BALLOONS) ×2
BALLN MUSTANG 12.0X40 75 (BALLOONS) ×2
BALLN MUSTANG 6X80X75 (BALLOONS) ×2
BALLOON ATLAS 14X40X75 (BALLOONS) IMPLANT
BALLOON MUSTANG 12.0X40 75 (BALLOONS) IMPLANT
BALLOON MUSTANG 6X80X75 (BALLOONS) IMPLANT
COVER DOME SNAP 22 D (MISCELLANEOUS) ×2 IMPLANT
GLIDEWIRE ADV .035X180CM (WIRE) IMPLANT
KIT ENCORE 26 ADVANTAGE (KITS) IMPLANT
KIT MICROPUNCTURE NIT STIFF (SHEATH) IMPLANT
PROTECTION STATION PRESSURIZED (MISCELLANEOUS) ×2
SHEATH PINNACLE R/O II 7F 4CM (SHEATH) IMPLANT
SHEATH PROBE COVER 6X72 (BAG) ×2 IMPLANT
STATION PROTECTION PRESSURIZED (MISCELLANEOUS) ×2 IMPLANT
STOPCOCK MORSE 400PSI 3WAY (MISCELLANEOUS) ×2 IMPLANT
TRAY PV CATH (CUSTOM PROCEDURE TRAY) ×2 IMPLANT
TUBING CIL FLEX 10 FLL-RA (TUBING) ×2 IMPLANT
WIRE TORQFLEX AUST .018X40CM (WIRE) IMPLANT

## 2023-07-17 NOTE — Op Note (Signed)
DATE OF SERVICE: 07/17/2023  PATIENT:  Ronald Reeves  67 y.o. male  PRE-OPERATIVE DIAGNOSIS:  ESRD  POST-OPERATIVE DIAGNOSIS:  Same  PROCEDURE:   1) ultrasound guided right arm fistula access 2) right brachiocephalic angioplasty (14x23mm Atlas) 3) AVF angioplasty (6x82mm Mustang) 4) fistulagram  SURGEON:  Surgeons and Role:    * [name], MD - Primary  ASSISTANT: none  ANESTHESIA:   local  EBL: minimal  BLOOD ADMINISTERED:none  DRAINS: none   LOCAL MEDICATIONS USED:  LIDOCAINE   SPECIMEN:  none  COUNTS: confirmed correct.  TOURNIQUET:  none  PATIENT DISPOSITION:  PACU - hemodynamically stable.   Delay start of Pharmacological VTE agent (>24hrs) due to surgical blood loss or risk of bleeding: no  INDICATION FOR PROCEDURE: Jarrod Hibdon is a 67 y.o. male with malfunctioning right arm fistula. After careful discussion of risks, benefits, and alternatives the patient was offered fistulagram. The patient understood and wished to proceed.  OPERATIVE FINDINGS:  SVC patent Right brachiocephalic vein with 50% stenosis at thoracic outlet 20% residual stenosis after angioplasty Right subclavian vein patent Right axillary vein patent Right arm basilic vein fistula diminutive with multifocal stenosis Limited improvement in fistula to angioplasty  DESCRIPTION OF PROCEDURE: After identification of the patient in the pre-operative holding area, the patient was transferred to the operating room. The patient was positioned supine on the operating room table. Anesthesia was induced. The right arm was prepped and draped in standard fashion. A surgical pause was performed confirming correct patient, procedure, and operative location.  Using duplex ultrasound, the fistula was accessed with micropuncture technique. The fistula was imaged in stations. See above for details.   The lesions were crossed with a glidewire advantage. Access was upsized to 77F. Angioplasty of the  brachiocephalic vein was performed with a 14x64mm Atlas balloon. The fistula was angioplastied with a 6x52mm Mustang balloon. After completion angiogram, all endovascular equipment was removed. A figure of eight stitch was applied to the access site with good hemostasis.  Upon completion of the case instrument and sharps counts were confirmed correct. The patient was transferred to the PACU in good condition. I was present for all portions of the procedure.  FOLLOW UP PLAN: OK to try to cannulate fistula. I do not think this access will work long term. Follow up with me in 1 month to evaluate.  Rande Brunt. Lenell Antu, MD [facility] Phone Number: [phone] 07/17/2023 11:58 AM

## 2023-07-17 NOTE — Interval H&P Note (Signed)
History and Physical Interval Note:  07/17/2023 11:58 AM  Ronald Reeves  has presented today for surgery, with the diagnosis of instage renal.  The various methods of treatment have been discussed with the patient and family. After consideration of risks, benefits and other options for treatment, the patient has consented to  Procedure(s): A/V Fistulagram (Right) PERIPHERAL VASCULAR BALLOON ANGIOPLASTY as a surgical intervention.  The patient's history has been reviewed, patient examined, no change in status, stable for surgery.  I have reviewed the patient's chart and labs.  Questions were answered to the patient's satisfaction.     [name]

## 2023-08-07 ENCOUNTER — Other Ambulatory Visit: Payer: Self-pay

## 2023-08-07 DIAGNOSIS — N186 End stage renal disease: Secondary | ICD-10-CM

## 2023-08-14 ENCOUNTER — Ambulatory Visit (HOSPITAL_COMMUNITY)
Admission: RE | Admit: 2023-08-14 | Discharge: 2023-08-14 | Disposition: A | Discharge status: Home / Self Care | Payer: Medicare Other | Source: Ambulatory Visit | Attending: Vascular Surgery

## 2023-08-14 DIAGNOSIS — Z992 Dependence on renal dialysis: Secondary | ICD-10-CM | POA: Diagnosis present

## 2023-08-14 DIAGNOSIS — N186 End stage renal disease: Secondary | ICD-10-CM | POA: Insufficient documentation

## 2023-08-17 NOTE — H&P (View-Only) (Signed)
[facility] [place]  ASSESSMENT / PLAN: 68 y.o. male with ESRD on HD. Dialyzing via right femoral tunneled dialysis catheter.  He has few conventional permanent dialysis access options available.  He is right-handed, and would prefer access in his left arm if possible.  Will arrange for bilateral upper extremity venogram to assess the suitability of the axillary veins for arteriovenous grafting.  CHIEF COMPLAINT: Dialysis access options  HISTORY OF PRESENT ILLNESS: Ronald Reeves is a 68 y.o. male with end-stage renal disease, dialyzing Tuesday, Thursday, and Saturday.  He is currently using a right femoral tunneled dialysis catheter for dialysis.  I performed a fistulogram forearm last month which showed a patent right arm brachiobasilic AV fistula which was too small to be used.  The patient's dialysis center has tried using it, without much success.  He has thrombosed access on the left arm as well.   Past Medical History:  Diagnosis Date   Anemia    Arthritis    Back pain    Bronchitis    COPD (chronic obstructive pulmonary disease) (HCC)    Coronary artery disease    COVID    mild - flu like symptoms   Dyspnea    w/ exertion, uses inhaler   ESRD on hemodialysis (HCC)    dialysis on tues, thurs, sat at [facility]   GERD (gastroesophageal reflux disease)    not a current problem   Heart murmur    never has caused any problems   Hypertension    Myocardial infarction (HCC)    Pneumonia    x 1   Pre-diabetes    diet controlled, no meds, does not check blood sugar   Substance abuse [facility])     Past Surgical History:  Procedure Laterality Date   A/V FISTULAGRAM Right 06/17/2023   Procedure: A/V Fistulagram;  Surgeon: Victorino Sparrow, MD;  Location: [facility] INVASIVE CV LAB;  Service: Cardiovascular;  Laterality: Right;   A/V FISTULAGRAM Right 07/17/2023   Procedure: A/V Fistulagram;  Surgeon: [name], MD;  Location: [facility] INVASIVE CV LAB;  Service: Cardiovascular;   Laterality: Right;   AV FISTULA PLACEMENT Left 04/12/2021   Procedure: LEFT ARM ARTERIOVENOUS (AV) FISTULA CREATION;  Surgeon: Cephus Shelling, MD;  Location: [facility] OR;  Service: Vascular;  Laterality: Left;   AV FISTULA PLACEMENT Left 10/18/2021   Procedure: LEFT ARM BRACHIOBASILIC FISTULA CREATION FIRST STAGE;  Surgeon: Cephus Shelling, MD;  Location: [facility] OR;  Service: Vascular;  Laterality: Left;   AV FISTULA PLACEMENT Right 11/28/2022   Procedure: RIGHT ARM FIRST STAGE BRACHIOBASILIC FISTULA CREATION;  Surgeon: Cephus Shelling, MD;  Location: [facility] OR;  Service: Vascular;  Laterality: Right;   BACK SURGERY     BASCILIC VEIN TRANSPOSITION Left 01/27/2022   Procedure: LEFT SECOND STAGE BASILIC VEIN TRANSPOSITION;  Surgeon: Cephus Shelling, MD;  Location: [facility] OR;  Service: Vascular;  Laterality: Left;   BASCILIC VEIN TRANSPOSITION Right 02/18/2023   Procedure: SECOND STAGE RIGHT ARM BASILIC VEIN TRANSPOSITION;  Surgeon: Cephus Shelling, MD;  Location: [facility] OR;  Service: Vascular;  Laterality: Right;   BIOPSY  08/20/2022   Procedure: BIOPSY;  Surgeon: Shellia Cleverly, DO;  Location: [facility];  Service: Gastroenterology;;   COLONOSCOPY WITH PROPOFOL N/A 08/20/2022   Procedure: COLONOSCOPY WITH PROPOFOL;  Surgeon: Shellia Cleverly, DO;  Location: [facility];  Service: Gastroenterology;  Laterality: N/A;   DIALYSIS/PERMA CATHETER INSERTION Right 03/05/2023   Procedure: DIALYSIS/PERMA CATHETER INSERTION;  Surgeon: Chestine Spore,  Canary Brim, MD;  Location: [facility] INVASIVE CV LAB;  Service: Cardiovascular;  Laterality: Right;   ESOPHAGOGASTRODUODENOSCOPY (EGD) WITH PROPOFOL N/A 08/20/2022   Procedure: ESOPHAGOGASTRODUODENOSCOPY (EGD) WITH PROPOFOL;  Surgeon: Shellia Cleverly, DO;  Location: [facility];  Service: Gastroenterology;  Laterality: N/A;   INSERTION OF DIALYSIS CATHETER Right 05/01/2022   Procedure: INSERTION OF DIALYSIS CATHETER;  Surgeon: [name], MD;  Location: [facility];   Service: Vascular;  Laterality: Right;   INSERTION OF DIALYSIS CATHETER Right 07/10/2023   Procedure: ULTRASOUNDED GUIDED INSERTION OF TUNNELED  DIALYSIS CATHETER TO RIGHT FEMORAL VEIN;  Surgeon: Cephus Shelling, MD;  Location: [facility];  Service: Vascular;  Laterality: Right;   IR REMOVAL TUN CV CATH W/O FL  12/19/2022   LIGATION OF COMPETING BRANCHES OF ARTERIOVENOUS FISTULA Left 07/15/2021   Procedure: LIGATION OF COMPETING BRANCHES OF LEFT RADIOCEPHALIC ARTERIOVENOUS FISTULA TIMES TWO;  Surgeon: Cephus Shelling, MD;  Location: [facility];  Service: Vascular;  Laterality: Left;   PERIPHERAL VASCULAR BALLOON ANGIOPLASTY  07/17/2023   Procedure: PERIPHERAL VASCULAR BALLOON ANGIOPLASTY;  Surgeon: [name], MD;  Location: [facility] INVASIVE CV LAB;  Service: Cardiovascular;;   PERIPHERAL VASCULAR INTERVENTION  06/17/2023   Procedure: PERIPHERAL VASCULAR INTERVENTION;  Surgeon: Victorino Sparrow, MD;  Location: [facility] INVASIVE CV LAB;  Service: Cardiovascular;;   POLYPECTOMY  08/20/2022   Procedure: POLYPECTOMY;  Surgeon: Shellia Cleverly, DO;  Location: [facility];  Service: Gastroenterology;;   REVISON OF ARTERIOVENOUS FISTULA Left 07/15/2021   Procedure: REVISON OF LEFT ARTERIOVENOUS FISTULA;  Surgeon: Cephus Shelling, MD;  Location: [facility] OR;  Service: Vascular;  Laterality: Left;   REVISON OF ARTERIOVENOUS FISTULA Left 05/01/2022   Procedure: ARTERIOVENOUS FISTULA WASHOUT OF ARM HEMATOMA;  Surgeon: [name], MD;  Location: [facility] OR;  Service: Vascular;  Laterality: Left;    Family History  Problem Relation Age of Onset   Heart disease Mother    Heart attack Sister 48    Social History   Socioeconomic History   Marital status: Married    Spouse name: Not on file   Number of children: Not on file   Years of education: Not on file   Highest education level: Not on file  Occupational History   Not on file  Tobacco Use   Smoking status: Every Day    Current packs/day: 0.50     Types: Cigars, Cigarettes    Last attempt to quit: 2019    Passive exposure: Past   Smokeless tobacco: Never   Tobacco comments:    1-2 per day -  black and mild cigars  Vaping Use   Vaping status: Never Used  Substance and Sexual Activity   Alcohol use: Yes    Alcohol/week: 14.0 standard drinks of alcohol    Types: 14 Cans of beer per week    Comment: Daily one-two beers per day   Drug use: Yes    Frequency: 1.0 times per week    Types: Marijuana, Cocaine    Comment: marijuana last use 02/14/23, cocaine - no longer using per pt 11/27/22 once a week (11/19/22)   Sexual activity: Yes  Other Topics Concern   Not on file  Social History Narrative   Not on file   Social Drivers of Health   Financial Resource Strain: Low Risk  (07/06/2023)   Received from [facility]   Overall Financial Resource Strain (CARDIA)    Difficulty of Paying Living Expenses: Not hard at all  Food Insecurity:  No Food Insecurity (07/06/2023)   Received from [facility] Vital Sign    Worried About Running Out of Food in the Last Year: Never true    Ran Out of Food in the Last Year: Never true  Transportation Needs: No Transportation Needs (07/06/2023)   Received from [facility] - Transportation    Lack of Transportation (Medical): No    Lack of Transportation (Non-Medical): No  Physical Activity: Not on file  Stress: No Stress Concern Present (05/22/2023)   Received from [facility] - Occupational Stress Questionnaire    Feeling of Stress : Not at all  Social Connections: Unknown (09/03/2022)   Received from [facility]   Social Network    Social Network: Not on file  Intimate Partner Violence: Not At Risk (05/22/2023)   Received from [facility]   HITS    Over the last 12 months how often did your partner physically hurt you?: Never    Over the last 12 months how often did your partner insult you or talk down to  you?: Never    Over the last 12 months how often did your partner threaten you with physical harm?: Never    Over the last 12 months how often did your partner scream or curse at you?: Never    Allergies  Allergen Reactions   Gabapentin     Encephalopathy, tremor   Iodinated Contrast Media Nausea And Vomiting    Current Outpatient Medications  Medication Sig Dispense Refill   albuterol (VENTOLIN HFA) 108 (90 Base) MCG/ACT inhaler Inhale 2 puffs into the lungs every 6 (six) hours as needed for wheezing or shortness of breath.     amLODipine (NORVASC) 10 MG tablet Take 1 tablet (10 mg total) by mouth daily. 90 tablet 3   apixaban (ELIQUIS) 5 MG TABS tablet Take 1 tablet (5 mg total) by mouth 2 (two) times daily.     B Complex-C-Folic Acid (DIALYVITE TABLET) TABS Take 1 tablet by mouth daily.     carvedilol (COREG) 12.5 MG tablet Take 12.5 mg by mouth 2 (two) times daily with a meal.     furosemide (LASIX) 40 MG tablet Take 40 mg by mouth daily.     gabapentin (NEURONTIN) 300 MG capsule Take 300 mg by mouth 3 (three) times daily. (Patient not taking: Reported on 07/08/2023)     hydrALAZINE (APRESOLINE) 25 MG tablet Take 25 mg by mouth 2 (two) times daily.     isosorbide mononitrate (IMDUR) 30 MG 24 hr tablet Take 1 tablet (30 mg total) by mouth daily. 90 tablet 3   labetalol (NORMODYNE) 100 MG tablet Take 100 mg by mouth 2 (two) times daily.     lidocaine (LIDODERM) 5 % Place 1 patch onto the skin daily as needed (pain). Remove & Discard patch within 12 hours or as directed by MD     mirtazapine (REMERON) 15 MG tablet Take 7.5-15 mg by mouth at bedtime as needed (Sleep).     naloxone (NARCAN) nasal spray 4 mg/0.1 mL Place 0.4 mg into the nose See admin instructions.  SPRAY 1 SPRAY INTO ONE NOSTRIL AS DIRECTED AS NEEDED FOR RESCUE FOR OPIOID OVERDOSE - CALL 911 IMMEDIATELY, ADMINISTER DOSE, THEN TURN PERSON ON SIDE - IF NO RESPONSE IN 2-3 MINUTES OR PERSON RESPONDS BUT RELAPSES, REPEAT USING A  NEW SPRAY DEVICE AND SPRAY INTO THE OTHER NOSTRIL AS NEEDED FOR RESCUE  omeprazole (PRILOSEC) 40 MG capsule Take 1 capsule (40 mg total) by mouth daily. 30 capsule 2   oxyCODONE (ROXICODONE) 5 MG immediate release tablet Take 1 tablet (5 mg total) by mouth every 6 (six) hours as needed for severe pain (pain score 7-10). 8 tablet 0   rosuvastatin (CRESTOR) 10 MG tablet Take 10 mg by mouth daily.     sevelamer carbonate (RENVELA) 800 MG tablet Take 1,600 mg by mouth 3 (three) times daily with meals.     sorbitol 70 % solution Take 10 mLs by mouth 3 (three) times daily as needed (constipation).     tamsulosin (FLOMAX) 0.4 MG CAPS capsule Take 0.8 mg by mouth 2 (two) times daily.     VITAMIN D, ERGOCALCIFEROL, PO Take 1 tablet by mouth daily.     Current Facility-Administered Medications  Medication Dose Route Frequency Provider Last Rate Last Admin   sodium chloride flush (NS) 0.9 % injection 3 mL  3 mL Intravenous PRN [name], MD        PHYSICAL EXAM There were no vitals filed for this visit.  No distress Regular rate and rhythm Unlabored breathing Right arm fistula has thrill, but it is too small for cannulation by physical exam Palpable brachial pulse on the left  PERTINENT LABORATORY AND RADIOLOGIC DATA  Most recent CBC    Latest Ref Rng & Units 07/17/2023    9:47 AM 07/10/2023    7:10 AM 07/08/2023    3:58 AM  CBC  Hemoglobin 13.0 - 17.0 g/dL 16.1  09.6  04.5   Hematocrit 39.0 - 52.0 % 38.0  36.0  42.0      Most recent CMP    Latest Ref Rng & Units 07/17/2023    9:47 AM 07/10/2023    7:10 AM 07/08/2023    3:58 AM  CMP  Glucose 70 - 99 mg/dL 409  811  914   BUN 8 - 23 mg/dL 52  53  58   Creatinine 0.61 - 1.24 mg/dL 7.82  9.56  2.13   Sodium 135 - 145 mmol/L 142  140  140   Potassium 3.5 - 5.1 mmol/L 4.4  4.4  5.0   Chloride 98 - 111 mmol/L 102  107  106     Renal function CrCl cannot be calculated (Patient's most recent lab result is older than the  maximum 21 days allowed.).  Hgb A1c MFr Bld (%)  Date Value  03/14/2023 5.8 (H)    LDL Cholesterol  Date Value Ref Range Status  03/14/2023 70 0 - 99 mg/dL Final    Comment:           Total Cholesterol/HDL:CHD Risk Coronary Heart Disease Risk Table                     Men   Women  1/2 Average Risk   3.4   3.3  Average Risk       5.0   4.4  2 X Average Risk   9.6   7.1  3 X Average Risk  23.4   11.0        Use the calculated Patient Ratio above and the CHD Risk Table to determine the patient's CHD Risk.        ATP III CLASSIFICATION (LDL):  <100     mg/dL   Optimal  086-578  mg/dL   Near or Above  Optimal  130-159  mg/dL   Borderline  563-875  mg/dL   High  >643     mg/dL   Very High Performed at [facility], [address], [place], [place] [address]     Rande Brunt. Lenell Antu, MD FACS [facility] Phone Number: [phone] 08/17/2023 8:24 AM   Total time spent on preparing this encounter including chart review, data review, collecting history, examining the patient, coordinating care for this established patient, 30 minutes.  Portions of this report may have been transcribed using voice recognition software.  Every effort has been made to ensure accuracy; however, inadvertent computerized transcription errors may still be present.

## 2023-08-17 NOTE — Progress Notes (Signed)
 [facility] [place]  ASSESSMENT / PLAN: 68 y.o. male with ESRD on HD. Dialyzing via right femoral tunneled dialysis catheter.  He has few conventional permanent dialysis access options available.  He is right-handed, and would prefer access in his left arm if possible.  Will arrange for bilateral upper extremity venogram to assess the suitability of the axillary veins for arteriovenous grafting.  CHIEF COMPLAINT: Dialysis access options  HISTORY OF PRESENT ILLNESS: Ronald Reeves is a 68 y.o. male with end-stage renal disease, dialyzing Tuesday, Thursday, and Saturday.  He is currently using a right femoral tunneled dialysis catheter for dialysis.  I performed a fistulogram forearm last month which showed a patent right arm brachiobasilic AV fistula which was too small to be used.  The patient's dialysis center has tried using it, without much success.  He has thrombosed access on the left arm as well.   Past Medical History:  Diagnosis Date   Anemia    Arthritis    Back pain    Bronchitis    COPD (chronic obstructive pulmonary disease) (HCC)    Coronary artery disease    COVID    mild - flu like symptoms   Dyspnea    w/ exertion, uses inhaler   ESRD on hemodialysis (HCC)    dialysis on tues, thurs, sat at [facility]   GERD (gastroesophageal reflux disease)    not a current problem   Heart murmur    never has caused any problems   Hypertension    Myocardial infarction (HCC)    Pneumonia    x 1   Pre-diabetes    diet controlled, no meds, does not check blood sugar   Substance abuse [facility])     Past Surgical History:  Procedure Laterality Date   A/V FISTULAGRAM Right 06/17/2023   Procedure: A/V Fistulagram;  Surgeon: Victorino Sparrow, MD;  Location: [facility] INVASIVE CV LAB;  Service: Cardiovascular;  Laterality: Right;   A/V FISTULAGRAM Right 07/17/2023   Procedure: A/V Fistulagram;  Surgeon: [name], MD;  Location: [facility] INVASIVE CV LAB;  Service: Cardiovascular;   Laterality: Right;   AV FISTULA PLACEMENT Left 04/12/2021   Procedure: LEFT ARM ARTERIOVENOUS (AV) FISTULA CREATION;  Surgeon: Cephus Shelling, MD;  Location: [facility] OR;  Service: Vascular;  Laterality: Left;   AV FISTULA PLACEMENT Left 10/18/2021   Procedure: LEFT ARM BRACHIOBASILIC FISTULA CREATION FIRST STAGE;  Surgeon: Cephus Shelling, MD;  Location: [facility] OR;  Service: Vascular;  Laterality: Left;   AV FISTULA PLACEMENT Right 11/28/2022   Procedure: RIGHT ARM FIRST STAGE BRACHIOBASILIC FISTULA CREATION;  Surgeon: Cephus Shelling, MD;  Location: [facility] OR;  Service: Vascular;  Laterality: Right;   BACK SURGERY     BASCILIC VEIN TRANSPOSITION Left 01/27/2022   Procedure: LEFT SECOND STAGE BASILIC VEIN TRANSPOSITION;  Surgeon: Cephus Shelling, MD;  Location: [facility] OR;  Service: Vascular;  Laterality: Left;   BASCILIC VEIN TRANSPOSITION Right 02/18/2023   Procedure: SECOND STAGE RIGHT ARM BASILIC VEIN TRANSPOSITION;  Surgeon: Cephus Shelling, MD;  Location: [facility] OR;  Service: Vascular;  Laterality: Right;   BIOPSY  08/20/2022   Procedure: BIOPSY;  Surgeon: Shellia Cleverly, DO;  Location: [facility];  Service: Gastroenterology;;   COLONOSCOPY WITH PROPOFOL N/A 08/20/2022   Procedure: COLONOSCOPY WITH PROPOFOL;  Surgeon: Shellia Cleverly, DO;  Location: [facility];  Service: Gastroenterology;  Laterality: N/A;   DIALYSIS/PERMA CATHETER INSERTION Right 03/05/2023   Procedure: DIALYSIS/PERMA CATHETER INSERTION;  Surgeon: Chestine Spore,  Canary Brim, MD;  Location: [facility] INVASIVE CV LAB;  Service: Cardiovascular;  Laterality: Right;   ESOPHAGOGASTRODUODENOSCOPY (EGD) WITH PROPOFOL N/A 08/20/2022   Procedure: ESOPHAGOGASTRODUODENOSCOPY (EGD) WITH PROPOFOL;  Surgeon: Shellia Cleverly, DO;  Location: [facility];  Service: Gastroenterology;  Laterality: N/A;   INSERTION OF DIALYSIS CATHETER Right 05/01/2022   Procedure: INSERTION OF DIALYSIS CATHETER;  Surgeon: [name], MD;  Location: [facility];   Service: Vascular;  Laterality: Right;   INSERTION OF DIALYSIS CATHETER Right 07/10/2023   Procedure: ULTRASOUNDED GUIDED INSERTION OF TUNNELED  DIALYSIS CATHETER TO RIGHT FEMORAL VEIN;  Surgeon: Cephus Shelling, MD;  Location: [facility];  Service: Vascular;  Laterality: Right;   IR REMOVAL TUN CV CATH W/O FL  12/19/2022   LIGATION OF COMPETING BRANCHES OF ARTERIOVENOUS FISTULA Left 07/15/2021   Procedure: LIGATION OF COMPETING BRANCHES OF LEFT RADIOCEPHALIC ARTERIOVENOUS FISTULA TIMES TWO;  Surgeon: Cephus Shelling, MD;  Location: [facility];  Service: Vascular;  Laterality: Left;   PERIPHERAL VASCULAR BALLOON ANGIOPLASTY  07/17/2023   Procedure: PERIPHERAL VASCULAR BALLOON ANGIOPLASTY;  Surgeon: [name], MD;  [facility]: [facility] INVASIVE CV LAB;  Service: Cardiovascular;;   PERIPHERAL VASCULAR INTERVENTION  06/17/2023   Procedure: PERIPHERAL VASCULAR INTERVENTION;  Surgeon: Victorino Sparrow, MD;  Location: [facility] INVASIVE CV LAB;  Service: Cardiovascular;;   POLYPECTOMY  08/20/2022   Procedure: POLYPECTOMY;  Surgeon: Shellia Cleverly, DO;  Location: [facility];  Service: Gastroenterology;;   REVISON OF ARTERIOVENOUS FISTULA Left 07/15/2021   Procedure: REVISON OF LEFT ARTERIOVENOUS FISTULA;  Surgeon: Cephus Shelling, MD;  Location: [facility] OR;  Service: Vascular;  Laterality: Left;   REVISON OF ARTERIOVENOUS FISTULA Left 05/01/2022   Procedure: ARTERIOVENOUS FISTULA WASHOUT OF ARM HEMATOMA;  Surgeon: [name], MD;  Location: [facility] OR;  Service: Vascular;  Laterality: Left;    Family History  Problem Relation Age of Onset   Heart disease Mother    Heart attack Sister 48    Social History   Socioeconomic History   Marital status: Married    Spouse name: Not on file   Number of children: Not on file   Years of education: Not on file   Highest education level: Not on file  Occupational History   Not on file  Tobacco Use   Smoking status: Every Day    Current packs/day: 0.50     Types: Cigars, Cigarettes    Last attempt to quit: 2019    Passive exposure: Past   Smokeless tobacco: Never   Tobacco comments:    1-2 per day -  black and mild cigars  Vaping Use   Vaping status: Never Used  Substance and Sexual Activity   Alcohol use: Yes    Alcohol/week: 14.0 standard drinks of alcohol    Types: 14 Cans of beer per week    Comment: Daily one-two beers per day   Drug use: Yes    Frequency: 1.0 times per week    Types: Marijuana, Cocaine    Comment: marijuana last use 02/14/23, cocaine - no longer using per pt 11/27/22 once a week (11/19/22)   Sexual activity: Yes  Other Topics Concern   Not on file  Social History Narrative   Not on file   Social Drivers of Health   Financial Resource Strain: Low Risk  (07/06/2023)   Received from [facility]   Overall Financial Resource Strain (CARDIA)    Difficulty of Paying Living Expenses: Not hard at all  Food Insecurity:  No Food Insecurity (07/06/2023)   Received from [facility] Vital Sign    Worried About Running Out of Food in the Last Year: Never true    Ran Out of Food in the Last Year: Never true  Transportation Needs: No Transportation Needs (07/06/2023)   Received from [facility] - Transportation    Lack of Transportation (Medical): No    Lack of Transportation (Non-Medical): No  Physical Activity: Not on file  Stress: No Stress Concern Present (05/22/2023)   Received from [facility] - Occupational Stress Questionnaire    Feeling of Stress : Not at all  Social Connections: Unknown (09/03/2022)   Received from [facility]   Social Network    Social Network: Not on file  Intimate Partner Violence: Not At Risk (05/22/2023)   Received from [facility]   HITS    Over the last 12 months how often did your partner physically hurt you?: Never    Over the last 12 months how often did your partner insult you or talk down to  you?: Never    Over the last 12 months how often did your partner threaten you with physical harm?: Never    Over the last 12 months how often did your partner scream or curse at you?: Never    Allergies  Allergen Reactions   Gabapentin     Encephalopathy, tremor   Iodinated Contrast Media Nausea And Vomiting    Current Outpatient Medications  Medication Sig Dispense Refill   albuterol (VENTOLIN HFA) 108 (90 Base) MCG/ACT inhaler Inhale 2 puffs into the lungs every 6 (six) hours as needed for wheezing or shortness of breath.     amLODipine (NORVASC) 10 MG tablet Take 1 tablet (10 mg total) by mouth daily. 90 tablet 3   apixaban (ELIQUIS) 5 MG TABS tablet Take 1 tablet (5 mg total) by mouth 2 (two) times daily.     B Complex-C-Folic Acid (DIALYVITE TABLET) TABS Take 1 tablet by mouth daily.     carvedilol (COREG) 12.5 MG tablet Take 12.5 mg by mouth 2 (two) times daily with a meal.     furosemide (LASIX) 40 MG tablet Take 40 mg by mouth daily.     gabapentin (NEURONTIN) 300 MG capsule Take 300 mg by mouth 3 (three) times daily. (Patient not taking: Reported on 07/08/2023)     hydrALAZINE (APRESOLINE) 25 MG tablet Take 25 mg by mouth 2 (two) times daily.     isosorbide mononitrate (IMDUR) 30 MG 24 hr tablet Take 1 tablet (30 mg total) by mouth daily. 90 tablet 3   labetalol (NORMODYNE) 100 MG tablet Take 100 mg by mouth 2 (two) times daily.     lidocaine (LIDODERM) 5 % Place 1 patch onto the skin daily as needed (pain). Remove & Discard patch within 12 hours or as directed by MD     mirtazapine (REMERON) 15 MG tablet Take 7.5-15 mg by mouth at bedtime as needed (Sleep).     naloxone (NARCAN) nasal spray 4 mg/0.1 mL Place 0.4 mg into the nose See admin instructions.  SPRAY 1 SPRAY INTO ONE NOSTRIL AS DIRECTED AS NEEDED FOR RESCUE FOR OPIOID OVERDOSE - CALL 911 IMMEDIATELY, ADMINISTER DOSE, THEN TURN PERSON ON SIDE - IF NO RESPONSE IN 2-3 MINUTES OR PERSON RESPONDS BUT RELAPSES, REPEAT USING A  NEW SPRAY DEVICE AND SPRAY INTO THE OTHER NOSTRIL AS NEEDED FOR RESCUE  omeprazole (PRILOSEC) 40 MG capsule Take 1 capsule (40 mg total) by mouth daily. 30 capsule 2   oxyCODONE (ROXICODONE) 5 MG immediate release tablet Take 1 tablet (5 mg total) by mouth every 6 (six) hours as needed for severe pain (pain score 7-10). 8 tablet 0   rosuvastatin (CRESTOR) 10 MG tablet Take 10 mg by mouth daily.     sevelamer carbonate (RENVELA) 800 MG tablet Take 1,600 mg by mouth 3 (three) times daily with meals.     sorbitol 70 % solution Take 10 mLs by mouth 3 (three) times daily as needed (constipation).     tamsulosin (FLOMAX) 0.4 MG CAPS capsule Take 0.8 mg by mouth 2 (two) times daily.     VITAMIN D, ERGOCALCIFEROL, PO Take 1 tablet by mouth daily.     Current Facility-Administered Medications  Medication Dose Route Frequency Provider Last Rate Last Admin   sodium chloride flush (NS) 0.9 % injection 3 mL  3 mL Intravenous PRN [name], MD        PHYSICAL EXAM There were no vitals filed for this visit.  No distress Regular rate and rhythm Unlabored breathing Right arm fistula has thrill, but it is too small for cannulation by physical exam Palpable brachial pulse on the left  PERTINENT LABORATORY AND RADIOLOGIC DATA  Most recent CBC    Latest Ref Rng & Units 07/17/2023    9:47 AM 07/10/2023    7:10 AM 07/08/2023    3:58 AM  CBC  Hemoglobin 13.0 - 17.0 g/dL 16.1  09.6  04.5   Hematocrit 39.0 - 52.0 % 38.0  36.0  42.0      Most recent CMP    Latest Ref Rng & Units 07/17/2023    9:47 AM 07/10/2023    7:10 AM 07/08/2023    3:58 AM  CMP  Glucose 70 - 99 mg/dL 409  811  914   BUN 8 - 23 mg/dL 52  53  58   Creatinine 0.61 - 1.24 mg/dL 7.82  9.56  2.13   Sodium 135 - 145 mmol/L 142  140  140   Potassium 3.5 - 5.1 mmol/L 4.4  4.4  5.0   Chloride 98 - 111 mmol/L 102  107  106     Renal function CrCl cannot be calculated (Patient's most recent lab result is older than the  maximum 21 days allowed.).  Hgb A1c MFr Bld (%)  Date Value  03/14/2023 5.8 (H)    LDL Cholesterol  Date Value Ref Range Status  03/14/2023 70 0 - 99 mg/dL Final    Comment:           Total Cholesterol/HDL:CHD Risk Coronary Heart Disease Risk Table                     Men   Women  1/2 Average Risk   3.4   3.3  Average Risk       5.0   4.4  2 X Average Risk   9.6   7.1  3 X Average Risk  23.4   11.0        Use the calculated Patient Ratio above and the CHD Risk Table to determine the patient's CHD Risk.        ATP III CLASSIFICATION (LDL):  <100     mg/dL   Optimal  086-578  mg/dL   Near or Above  Optimal  130-159  mg/dL   Borderline  563-875  mg/dL   High  >643     mg/dL   Very High Performed at [facility], [address], [place], [place] [address]     Rande Brunt. Lenell Antu, MD FACS [facility] Phone Number: [phone] 08/17/2023 8:24 AM   Total time spent on preparing this encounter including chart review, data review, collecting history, examining the patient, coordinating care for this established patient, 30 minutes.  Portions of this report may have been transcribed using voice recognition software.  Every effort has been made to ensure accuracy; however, inadvertent computerized transcription errors may still be present.

## 2023-08-18 ENCOUNTER — Ambulatory Visit (INDEPENDENT_AMBULATORY_CARE_PROVIDER_SITE_OTHER): Payer: Medicare Other | Admitting: Vascular Surgery

## 2023-08-18 ENCOUNTER — Encounter: Payer: Self-pay | Admitting: Vascular Surgery

## 2023-08-18 ENCOUNTER — Encounter: Payer: Self-pay | Admitting: Pulmonary Disease

## 2023-08-18 VITALS — BP 152/80 | HR 93 | Temp 97.9°F | Resp 20 | Ht 71.0 in | Wt 152.0 lb

## 2023-08-18 DIAGNOSIS — Z992 Dependence on renal dialysis: Secondary | ICD-10-CM

## 2023-08-18 DIAGNOSIS — N186 End stage renal disease: Secondary | ICD-10-CM

## 2023-08-20 ENCOUNTER — Other Ambulatory Visit: Payer: Self-pay

## 2023-08-20 DIAGNOSIS — N186 End stage renal disease: Secondary | ICD-10-CM

## 2023-08-20 MED ORDER — SODIUM CHLORIDE 0.9 % IV SOLN: 250.0000 mL | INTRAVENOUS | Status: AC | PRN

## 2023-08-20 MED ORDER — SODIUM CHLORIDE 0.9% FLUSH: 3.0000 mL | INTRAVENOUS | Status: DC | PRN

## 2023-08-20 NOTE — Addendum Note (Signed)
 Addended by: Primitivo Gauze on: 08/20/2023 01:45 PM   Modules accepted: Orders

## 2023-08-28 ENCOUNTER — Ambulatory Visit (HOSPITAL_COMMUNITY)
Admission: RE | Admit: 2023-08-28 | Discharge: 2023-08-28 | Disposition: A | Discharge status: Home / Self Care | Payer: No Typology Code available for payment source | Attending: Vascular Surgery | Admitting: Vascular Surgery

## 2023-08-28 ENCOUNTER — Other Ambulatory Visit: Payer: Self-pay

## 2023-08-28 ENCOUNTER — Encounter (HOSPITAL_COMMUNITY)
Admission: RE | Disposition: A | Discharge status: Home / Self Care | Payer: Self-pay | Source: Home / Self Care | Attending: Vascular Surgery

## 2023-08-28 DIAGNOSIS — N185 Chronic kidney disease, stage 5: Secondary | ICD-10-CM

## 2023-08-28 DIAGNOSIS — N186 End stage renal disease: Secondary | ICD-10-CM | POA: Insufficient documentation

## 2023-08-28 DIAGNOSIS — Z992 Dependence on renal dialysis: Secondary | ICD-10-CM | POA: Insufficient documentation

## 2023-08-28 DIAGNOSIS — F17210 Nicotine dependence, cigarettes, uncomplicated: Secondary | ICD-10-CM | POA: Diagnosis not present

## 2023-08-28 DIAGNOSIS — I120 Hypertensive chronic kidney disease with stage 5 chronic kidney disease or end stage renal disease: Secondary | ICD-10-CM | POA: Insufficient documentation

## 2023-08-28 HISTORY — PX: UPPER EXTREMITY VENOGRAPHY: CATH118272

## 2023-08-28 LAB — POCT I-STAT, CHEM 8
BUN: 23 mg/dL (ref 8–23)
Calcium, Ion: 1.18 mmol/L (ref 1.15–1.40)
Chloride: 97 mmol/L — ABNORMAL LOW (ref 98–111)
Creatinine, Ser: 6.4 mg/dL — ABNORMAL HIGH (ref 0.61–1.24)
Glucose, Bld: 109 mg/dL — ABNORMAL HIGH (ref 70–99)
HCT: 38 % — ABNORMAL LOW (ref 39.0–52.0)
Hemoglobin: 12.9 g/dL — ABNORMAL LOW (ref 13.0–17.0)
Potassium: 4.1 mmol/L (ref 3.5–5.1)
Sodium: 139 mmol/L (ref 135–145)
TCO2: 29 mmol/L (ref 22–32)

## 2023-08-28 SURGERY — UPPER EXTREMITY VENOGRAPHY
Anesthesia: LOCAL | Laterality: Bilateral

## 2023-08-28 MED ORDER — IODIXANOL 320 MG/ML IV SOLN
INTRAVENOUS | Status: DC | PRN
  Administered 2023-08-28: 55 mL

## 2023-08-28 MED ORDER — SODIUM CHLORIDE 0.9% FLUSH: 3.0000 mL | Freq: Two times a day (BID) | INTRAVENOUS | Status: DC

## 2023-08-28 MED ORDER — DIPHENHYDRAMINE HCL 50 MG/ML IJ SOLN
25.0000 mg | INTRAMUSCULAR | Status: AC
  Administered 2023-08-28: 25 mg via INTRAVENOUS
  Filled 2023-08-28: qty 1

## 2023-08-28 MED ORDER — METHYLPREDNISOLONE SODIUM SUCC 125 MG IJ SOLR
125.0000 mg | INTRAMUSCULAR | Status: AC
  Administered 2023-08-28: 125 mg via INTRAVENOUS
  Filled 2023-08-28: qty 2

## 2023-08-28 NOTE — Interval H&P Note (Signed)
History and Physical Interval Note:  08/28/2023 8:32 AM  Ronald Reeves  has presented today for surgery, with the diagnosis of instage renal.  The various methods of treatment have been discussed with the patient and family. After consideration of risks, benefits and other options for treatment, the patient has consented to  Procedure(s): UPPER EXTREMITY VENOGRAPHY (Bilateral) as a surgical intervention.  The patient's history has been reviewed, patient examined, no change in status, stable for surgery.  I have reviewed the patient's chart and labs.  Questions were answered to the patient's satisfaction.     [name]

## 2023-08-28 NOTE — Op Note (Signed)
DATE OF SERVICE: 08/28/2023  PATIENT:  Ronald Reeves  68 y.o. male  PRE-OPERATIVE DIAGNOSIS:  failed dialysis access  POST-OPERATIVE DIAGNOSIS:  Same  PROCEDURE:   Bilateral upper extremity venogram  SURGEON:  Rande Brunt. Lenell Antu, MD  ASSISTANT: none  ANESTHESIA:   none  ESTIMATED BLOOD LOSS: none  LOCAL MEDICATIONS USED:  NONE  COUNTS: confirmed correct.  PATIENT DISPOSITION:  PACU - hemodynamically stable.   Delay start of Pharmacological VTE agent (>24hrs) due to surgical blood loss or risk of bleeding: no  INDICATION FOR PROCEDURE: Ronald Reeves is a 68 y.o. male with failed dialysis access without clear explanation. He recently had a tunneled dialysis catheter placed which required femoral placement. After careful discussion of risks, benefits, and alternatives the patient was offered upper extremity venogram. The patient understood and wished to proceed.  OPERATIVE FINDINGS:  Multiple diminutive superficial veins in bilateral forearms and upper arms bilaterally. None suitable for dialysis access. Right axillary vein patent Right subclavian vein patent Right innominate vein not well seen Left brachial vein patent  Left axillary vein patent Left subclavian vein patent Left innominate vein patent  SVC fills via left innominate vein  DESCRIPTION OF PROCEDURE: Peripheral IVs were placed in bilateral upper extremities and venograms were performed in stations.  See above for details.  After satisfactory images were obtained, the procedure was stopped and the patient transported back to the preoperative holding area.  PLAN: Left upper extremity arteriovenous graft.  Rande Brunt. Lenell Antu, MD [facility] Phone Number: [phone] 08/28/2023 8:12 AM

## 2023-08-30 ENCOUNTER — Emergency Department (HOSPITAL_COMMUNITY)
Admission: EM | Admit: 2023-08-30 | Discharge: 2023-08-30 | Disposition: A | Discharge status: Home / Self Care | Payer: No Typology Code available for payment source | Attending: Emergency Medicine | Admitting: Emergency Medicine

## 2023-08-30 ENCOUNTER — Emergency Department (HOSPITAL_COMMUNITY): Payer: No Typology Code available for payment source

## 2023-08-30 ENCOUNTER — Other Ambulatory Visit: Payer: Self-pay

## 2023-08-30 ENCOUNTER — Encounter (HOSPITAL_COMMUNITY): Payer: Self-pay

## 2023-08-30 DIAGNOSIS — Z8673 Personal history of transient ischemic attack (TIA), and cerebral infarction without residual deficits: Secondary | ICD-10-CM | POA: Diagnosis not present

## 2023-08-30 DIAGNOSIS — N186 End stage renal disease: Secondary | ICD-10-CM | POA: Insufficient documentation

## 2023-08-30 DIAGNOSIS — Z79899 Other long term (current) drug therapy: Secondary | ICD-10-CM | POA: Diagnosis not present

## 2023-08-30 DIAGNOSIS — Z992 Dependence on renal dialysis: Secondary | ICD-10-CM | POA: Diagnosis not present

## 2023-08-30 DIAGNOSIS — R34 Anuria and oliguria: Secondary | ICD-10-CM | POA: Diagnosis not present

## 2023-08-30 DIAGNOSIS — I2510 Atherosclerotic heart disease of native coronary artery without angina pectoris: Secondary | ICD-10-CM | POA: Insufficient documentation

## 2023-08-30 DIAGNOSIS — R0602 Shortness of breath: Secondary | ICD-10-CM | POA: Insufficient documentation

## 2023-08-30 DIAGNOSIS — R339 Retention of urine, unspecified: Secondary | ICD-10-CM | POA: Diagnosis present

## 2023-08-30 DIAGNOSIS — I120 Hypertensive chronic kidney disease with stage 5 chronic kidney disease or end stage renal disease: Secondary | ICD-10-CM | POA: Insufficient documentation

## 2023-08-30 DIAGNOSIS — R052 Subacute cough: Secondary | ICD-10-CM | POA: Insufficient documentation

## 2023-08-30 DIAGNOSIS — Z20822 Contact with and (suspected) exposure to covid-19: Secondary | ICD-10-CM | POA: Insufficient documentation

## 2023-08-30 LAB — URINALYSIS, ROUTINE W REFLEX MICROSCOPIC
Bacteria, UA: NONE SEEN
Bilirubin Urine: NEGATIVE
Glucose, UA: NEGATIVE mg/dL
Ketones, ur: NEGATIVE mg/dL
Leukocytes,Ua: NEGATIVE
Nitrite: NEGATIVE
Protein, ur: 100 mg/dL — AB
Specific Gravity, Urine: 1.018 (ref 1.005–1.030)
pH: 7 (ref 5.0–8.0)

## 2023-08-30 LAB — I-STAT CHEM 8, ED
BUN: 57 mg/dL — ABNORMAL HIGH (ref 8–23)
Calcium, Ion: 1.01 mmol/L — ABNORMAL LOW (ref 1.15–1.40)
Chloride: 99 mmol/L (ref 98–111)
Creatinine, Ser: 10.6 mg/dL — ABNORMAL HIGH (ref 0.61–1.24)
Glucose, Bld: 89 mg/dL (ref 70–99)
HCT: 40 % (ref 39.0–52.0)
Hemoglobin: 13.6 g/dL (ref 13.0–17.0)
Potassium: 4.9 mmol/L (ref 3.5–5.1)
Sodium: 135 mmol/L (ref 135–145)
TCO2: 26 mmol/L (ref 22–32)

## 2023-08-30 LAB — RESP PANEL BY RT-PCR (RSV, FLU A&B, COVID)  RVPGX2
Influenza A by PCR: NEGATIVE
Influenza B by PCR: NEGATIVE
Resp Syncytial Virus by PCR: NEGATIVE
SARS Coronavirus 2 by RT PCR: NEGATIVE

## 2023-08-30 MED ORDER — IPRATROPIUM-ALBUTEROL 0.5-2.5 (3) MG/3ML IN SOLN
3.0000 mL | Freq: Once | RESPIRATORY_TRACT | Status: AC
  Administered 2023-08-30: 3 mL via RESPIRATORY_TRACT
  Filled 2023-08-30: qty 3

## 2023-08-30 NOTE — ED Notes (Signed)
NT did a bladder scan on pt pt had 236 mL of urine in bladder.

## 2023-08-30 NOTE — ED Provider Notes (Signed)
[place] EMERGENCY DEPARTMENT AT [facility] Provider Note   CSN: [id number] Arrival date & time: 08/30/23  1146     History  Chief Complaint  Patient presents with   Urinary Retention    Ronald Reeves is a 68 y.o. male with past medical history of HTN, ESRD on dialysis, polysubstance abuse, CAD, DVT, CVA presents to emergency department for evaluation of decreased urine over past three weeks.  He states that he was started on Stiolto inhaler about a month ago and was reading paperwork noting that a possible side effect could be decreased urinary output.  He reports that he does not normally have significant urinary output due to ESRD but still makes a little.  He denies burning with urination, hematuria  He goes to dialysis every Tuesday Thursday Saturday but missed his dialysis yesterday due to "not feeling well ".  He also complains of mild SOB and nonproductive cough both of which she notices at baseline however is interested in breathing treatment as he has not had 1 today. No fevers, CP, pedal edema.  HPI     Home Medications Prior to Admission medications   Medication Sig Start Date End Date Taking? Authorizing Provider  albuterol (VENTOLIN HFA) 108 (90 Base) MCG/ACT inhaler Inhale 2 puffs into the lungs every 6 (six) hours as needed (COPD).    [provider]  amLODipine (NORVASC) 10 MG tablet Take 1 tablet (10 mg total) by mouth daily. 01/01/18   Lewayne Bunting, MD  apixaban (ELIQUIS) 5 MG TABS tablet Take 1 tablet (5 mg total) by mouth 2 (two) times daily. 03/06/23   Atway, Rayann N, DO  B Complex-C-Folic Acid (DIALYVITE TABLET) TABS Take 1 tablet by mouth daily. 12/23/22   [provider]  furosemide (LASIX) 40 MG tablet Take 40 mg by mouth daily.    [provider]  hydrALAZINE (APRESOLINE) 25 MG tablet Take 25 mg by mouth 2 (two) times daily.    [provider]  naloxone [facility]) nasal spray 4 mg/0.1 mL Place 0.4 mg into the  nose See admin instructions.  SPRAY 1 SPRAY INTO ONE NOSTRIL AS DIRECTED AS NEEDED FOR RESCUE FOR OPIOID OVERDOSE - CALL 911 IMMEDIATELY, ADMINISTER DOSE, THEN TURN PERSON ON SIDE - IF NO RESPONSE IN 2-3 MINUTES OR PERSON RESPONDS BUT RELAPSES, REPEAT USING A NEW SPRAY DEVICE AND SPRAY INTO THE OTHER NOSTRIL AS NEEDED FOR RESCUE 01/21/23   [provider]  omeprazole (PRILOSEC) 40 MG capsule Take 1 capsule (40 mg total) by mouth daily. 06/06/22   Arnaldo Natal, NP  oxyCODONE (ROXICODONE) 5 MG immediate release tablet Take 1 tablet (5 mg total) by mouth every 6 (six) hours as needed for severe pain (pain score 7-10). Patient not taking: Reported on 08/26/2023 07/10/23   Baglia, Corrina, PA-C  rosuvastatin (CRESTOR) 10 MG tablet Take 10 mg by mouth daily.    [provider]  sevelamer carbonate (RENVELA) 800 MG tablet Take 1,600 mg by mouth 3 (three) times daily with meals. 01/06/23   [provider]  sorbitol 70 % solution Take 10 mLs by mouth 3 (three) times daily as needed (constipation). Patient not taking: Reported on 08/26/2023 01/20/23   [provider]  Tiotropium Bromide-Olodaterol 2.5-2.5 MCG/ACT AERS Take 2 puffs by mouth in the morning. 08/07/23   [provider]      Allergies    Gabapentin and Iodinated contrast media    Review of Systems   Review of Systems  Constitutional:  Negative for chills, fatigue and fever.  Respiratory:  Negative for cough, chest tightness, shortness of breath and wheezing.   Cardiovascular:  Negative for chest pain and palpitations.  Gastrointestinal:  Negative for abdominal pain, constipation, diarrhea, nausea and vomiting.  Neurological:  Negative for dizziness, seizures, weakness, light-headedness, numbness and headaches.    Physical Exam Updated Vital Signs BP (!) 141/78   Pulse 91   Temp 98.3 F (36.8 C) (Oral)   Resp 18   Ht 5\' 11"  (1.803 m)   Wt 68 kg   SpO2 100%   BMI 20.91 kg/m   Physical Exam Vitals and nursing note reviewed.  Constitutional:      General: He is not in acute distress.    Appearance: Normal appearance. He is not diaphoretic.  HENT:     Head: Normocephalic and atraumatic.  Eyes:     Conjunctiva/sclera: Conjunctivae normal.  Neck:     Vascular: No hepatojugular reflux or JVD.  Cardiovascular:     Rate and Rhythm: Normal rate.  Pulmonary:     Effort: Pulmonary effort is normal. No respiratory distress.     Comments: No tachypnea, nor increased work of breathing.  There is mild expiratory wheezing.  He is satting 100% on room air with no difficulty speaking in full and complete sentences. Abdominal:     General: Bowel sounds are normal. There is no distension.     Palpations: Abdomen is soft.     Tenderness: There is no abdominal tenderness. There is no guarding.     Comments: No suprapubic tenderness nor distention  Musculoskeletal:     Right lower leg: No edema.     Left lower leg: No edema.     Comments: No pedal edema BLE with no calf tenderness nor swelling  Skin:    General: Skin is warm.     Capillary Refill: Capillary refill takes less than 2 seconds.     Coloration: Skin is not jaundiced or pale.     Comments: No subconjunctival pallor nor mucosal pallor  Neurological:     Mental Status: He is alert and oriented to person, place, and time. Mental status is at baseline.     Cranial Nerves: No cranial nerve deficit.     Sensory: No sensory deficit.     Motor: No weakness.     Coordination: Coordination normal.     Gait: Gait normal.     Deep Tendon Reflexes: Reflexes normal.     ED Results / Procedures / Treatments   Labs (all labs ordered are listed, but only abnormal results are displayed) Labs Reviewed  URINALYSIS, ROUTINE W REFLEX MICROSCOPIC - Abnormal; Notable for the following components:      Result Value   Hgb urine dipstick SMALL (*)    Protein, ur 100 (*)    All other components within normal limits  RESP  PANEL BY RT-PCR (RSV, FLU A&B, COVID)  [id number]  I-STAT CHEM 8, ED    EKG None  Radiology No results found.  Procedures Procedures    Medications Ordered in ED Medications  ipratropium-albuterol (DUONEB) 0.5-2.5 (3) MG/3ML nebulizer solution 3 mL (3 mLs Nebulization Given 08/30/23 1641)    ED Course/ Medical Decision Making/ A&P                                 Medical Decision Making Amount and/or Complexity of Data Reviewed Labs: ordered. Radiology: ordered.  Risk Prescription drug management.   Patient presents to the ED for concern of urinary retention, sob, this involves an extensive number of treatment options, and is a complaint that carries with it a high risk of complications and morbidity.  The differential diagnosis includes UTI, kidney stone, sequelae of ESRD, infection, pneumonia, sequela of COPD, fluid overload   Co morbidities that complicate the patient evaluation  ESRD, COPD See HPI   Additional history obtained:  Additional history obtained from Nursing and Outside Medical Records   External records from outside source obtained and reviewed including  Triage RN note Recent medical evaluations and medication list   Lab Tests:  I Ordered, and personally interpreted labs.  The pertinent results include:   K+ NWL   Imaging Studies ordered:  I ordered imaging studies including chest x-ray I independently visualized and interpreted imaging which showed  Mild interstitial thickening and hazy lung opacities suspected to be pulmonary edema bilaterally I agree with the radiologist interpretation    Medicines ordered and prescription drug management:  I ordered medication including duoneb  for mild expiratory wheezing  Reevaluation of the patient after these medicines showed that the patient improved I have reviewed the patients home medicines and have made adjustments as needed     Problem List / ED Course:  Urinary retention bladder  scan notable for 230 mL UA negative for infection or gross hemoglobin No suprapubic tenderness or distention noted No back nor flank pain - low suspicion for nephrolithiasis Will have pt f/u with pcp Mild shortness of breath, expiratory wheezing, cough  X-ray negative for pneumonia.  Suspected pulmonary edema without effusion.  Likely due to missing recent dialysis. I have low suspicion that patient is fluid overloaded based on reassuring physical exam and no effusion noted on x-ray.  However he did miss his appointment yesterday.  I stressed the importance of following up with dialysis appointment Chem-8 negative for hyperkalemia Will provide DuoNeb for mild expiratory wheezing.  Notes improvement following this Discussed return to emergency precautions with patient expressed understanding agrees with plan.  All questions answered to his satisfaction.  He is very agreeable discharge.   Reevaluation:  After the interventions noted above, I reevaluated the patient and found that they have :improved   Social Determinants of Health:  Has pcp f.u   Dispostion:  After consideration of the diagnostic results and the patients response to treatment, I feel that the patent would benefit from outpatient management.    Final Clinical Impression(s) / ED Diagnoses Final diagnoses:  Decreased urine output    Rx / DC Orders ED Discharge Orders     None         Judithann Sheen, PA 08/30/23 1801    Arby Barrette, MD 09/02/23 1408

## 2023-08-30 NOTE — Discharge Instructions (Addendum)
Thank you for letting us evaluate you today.  Your bladder scan did not show extensive urinary retention.  Your urine was negative for infection or blood.  We provided you with a breathing treatment here in emergency department.  Your labs were within your baseline. Please make sure to go to next dialysis.  I believe your mild shortness of breath is due to missing your dialysis appointment yesterday.    Return to emergency department if you experience significant shortness of breath, chest pain, swelling in legs

## 2023-08-30 NOTE — ED Triage Notes (Addendum)
Pt presents with inability to urinate and empty bladder x 2 weeks. Pt states he does have dribbling with dysuria. Denies fever, chills. Pt has a hx of COPD, CKD and is on HD.   Pt was also + on triage infection screening for chills, body aches, cough, ShOB.

## 2023-08-31 ENCOUNTER — Encounter (HOSPITAL_COMMUNITY): Payer: Self-pay | Admitting: Vascular Surgery

## 2023-09-04 ENCOUNTER — Inpatient Hospital Stay (HOSPITAL_COMMUNITY): Payer: No Typology Code available for payment source

## 2023-09-04 ENCOUNTER — Encounter (HOSPITAL_COMMUNITY): Payer: Self-pay | Admitting: Emergency Medicine

## 2023-09-04 ENCOUNTER — Inpatient Hospital Stay (HOSPITAL_COMMUNITY)
Admission: EM | Admit: 2023-09-04 | Discharge: 2023-09-04 | DRG: 291 | Discharge status: Against Medical Advice | Payer: No Typology Code available for payment source | Attending: Infectious Diseases | Admitting: Infectious Diseases

## 2023-09-04 ENCOUNTER — Other Ambulatory Visit: Payer: Self-pay

## 2023-09-04 ENCOUNTER — Emergency Department (HOSPITAL_COMMUNITY): Payer: No Typology Code available for payment source

## 2023-09-04 DIAGNOSIS — Z5329 Procedure and treatment not carried out because of patient's decision for other reasons: Secondary | ICD-10-CM | POA: Diagnosis present

## 2023-09-04 DIAGNOSIS — N2581 Secondary hyperparathyroidism of renal origin: Secondary | ICD-10-CM | POA: Diagnosis present

## 2023-09-04 DIAGNOSIS — I252 Old myocardial infarction: Secondary | ICD-10-CM

## 2023-09-04 DIAGNOSIS — J189 Pneumonia, unspecified organism: Secondary | ICD-10-CM | POA: Diagnosis present

## 2023-09-04 DIAGNOSIS — I5023 Acute on chronic systolic (congestive) heart failure: Secondary | ICD-10-CM | POA: Diagnosis present

## 2023-09-04 DIAGNOSIS — I2510 Atherosclerotic heart disease of native coronary artery without angina pectoris: Secondary | ICD-10-CM | POA: Diagnosis present

## 2023-09-04 DIAGNOSIS — K219 Gastro-esophageal reflux disease without esophagitis: Secondary | ICD-10-CM | POA: Diagnosis present

## 2023-09-04 DIAGNOSIS — Z91158 Patient's noncompliance with renal dialysis for other reason: Secondary | ICD-10-CM

## 2023-09-04 DIAGNOSIS — R7303 Prediabetes: Secondary | ICD-10-CM | POA: Diagnosis present

## 2023-09-04 DIAGNOSIS — Z79899 Other long term (current) drug therapy: Secondary | ICD-10-CM

## 2023-09-04 DIAGNOSIS — F1410 Cocaine abuse, uncomplicated: Secondary | ICD-10-CM | POA: Diagnosis present

## 2023-09-04 DIAGNOSIS — N186 End stage renal disease: Secondary | ICD-10-CM | POA: Diagnosis present

## 2023-09-04 DIAGNOSIS — J440 Chronic obstructive pulmonary disease with acute lower respiratory infection: Secondary | ICD-10-CM | POA: Diagnosis present

## 2023-09-04 DIAGNOSIS — I10 Essential (primary) hypertension: Secondary | ICD-10-CM | POA: Diagnosis present

## 2023-09-04 DIAGNOSIS — Z888 Allergy status to other drugs, medicaments and biological substances status: Secondary | ICD-10-CM

## 2023-09-04 DIAGNOSIS — J9601 Acute respiratory failure with hypoxia: Secondary | ICD-10-CM | POA: Diagnosis present

## 2023-09-04 DIAGNOSIS — N400 Enlarged prostate without lower urinary tract symptoms: Secondary | ICD-10-CM | POA: Diagnosis present

## 2023-09-04 DIAGNOSIS — Z8249 Family history of ischemic heart disease and other diseases of the circulatory system: Secondary | ICD-10-CM

## 2023-09-04 DIAGNOSIS — F17290 Nicotine dependence, other tobacco product, uncomplicated: Secondary | ICD-10-CM | POA: Diagnosis present

## 2023-09-04 DIAGNOSIS — E785 Hyperlipidemia, unspecified: Secondary | ICD-10-CM | POA: Diagnosis present

## 2023-09-04 DIAGNOSIS — I509 Heart failure, unspecified: Secondary | ICD-10-CM

## 2023-09-04 DIAGNOSIS — J441 Chronic obstructive pulmonary disease with (acute) exacerbation: Principal | ICD-10-CM | POA: Diagnosis present

## 2023-09-04 DIAGNOSIS — Z992 Dependence on renal dialysis: Secondary | ICD-10-CM | POA: Diagnosis not present

## 2023-09-04 DIAGNOSIS — I132 Hypertensive heart and chronic kidney disease with heart failure and with stage 5 chronic kidney disease, or end stage renal disease: Secondary | ICD-10-CM | POA: Diagnosis present

## 2023-09-04 DIAGNOSIS — I5021 Acute systolic (congestive) heart failure: Secondary | ICD-10-CM

## 2023-09-04 DIAGNOSIS — E872 Acidosis, unspecified: Secondary | ICD-10-CM | POA: Diagnosis present

## 2023-09-04 DIAGNOSIS — Z91041 Radiographic dye allergy status: Secondary | ICD-10-CM

## 2023-09-04 DIAGNOSIS — F17210 Nicotine dependence, cigarettes, uncomplicated: Secondary | ICD-10-CM | POA: Diagnosis present

## 2023-09-04 DIAGNOSIS — R0902 Hypoxemia: Secondary | ICD-10-CM

## 2023-09-04 DIAGNOSIS — Z1152 Encounter for screening for COVID-19: Secondary | ICD-10-CM

## 2023-09-04 DIAGNOSIS — Z960 Presence of urogenital implants: Secondary | ICD-10-CM | POA: Diagnosis present

## 2023-09-04 DIAGNOSIS — D631 Anemia in chronic kidney disease: Secondary | ICD-10-CM | POA: Diagnosis present

## 2023-09-04 DIAGNOSIS — Z8673 Personal history of transient ischemic attack (TIA), and cerebral infarction without residual deficits: Secondary | ICD-10-CM

## 2023-09-04 DIAGNOSIS — Z716 Tobacco abuse counseling: Secondary | ICD-10-CM | POA: Diagnosis not present

## 2023-09-04 DIAGNOSIS — Z8616 Personal history of COVID-19: Secondary | ICD-10-CM | POA: Diagnosis not present

## 2023-09-04 DIAGNOSIS — Z7901 Long term (current) use of anticoagulants: Secondary | ICD-10-CM

## 2023-09-04 DIAGNOSIS — Z86718 Personal history of other venous thrombosis and embolism: Secondary | ICD-10-CM

## 2023-09-04 LAB — CBC WITH DIFFERENTIAL/PLATELET
Abs Immature Granulocytes: 0.03 10*3/uL (ref 0.00–0.07)
Basophils Absolute: 0.1 10*3/uL (ref 0.0–0.1)
Basophils Relative: 1 %
Eosinophils Absolute: 0.4 10*3/uL (ref 0.0–0.5)
Eosinophils Relative: 5 %
HCT: 36.1 % — ABNORMAL LOW (ref 39.0–52.0)
Hemoglobin: 11.7 g/dL — ABNORMAL LOW (ref 13.0–17.0)
Immature Granulocytes: 0 %
Lymphocytes Relative: 12 %
Lymphs Abs: 0.9 10*3/uL (ref 0.7–4.0)
MCH: 27.9 pg (ref 26.0–34.0)
MCHC: 32.4 g/dL (ref 30.0–36.0)
MCV: 86 fL (ref 80.0–100.0)
Monocytes Absolute: 0.7 10*3/uL (ref 0.1–1.0)
Monocytes Relative: 10 %
Neutro Abs: 5.3 10*3/uL (ref 1.7–7.7)
Neutrophils Relative %: 72 %
Platelets: 153 10*3/uL (ref 150–400)
RBC: 4.2 MIL/uL — ABNORMAL LOW (ref 4.22–5.81)
RDW: 17.7 % — ABNORMAL HIGH (ref 11.5–15.5)
WBC: 7.4 10*3/uL (ref 4.0–10.5)
nRBC: 0 % (ref 0.0–0.2)

## 2023-09-04 LAB — I-STAT VENOUS BLOOD GAS, ED
Acid-base deficit: 1 mmol/L (ref 0.0–2.0)
Bicarbonate: 23.8 mmol/L (ref 20.0–28.0)
Calcium, Ion: 0.95 mmol/L — ABNORMAL LOW (ref 1.15–1.40)
HCT: 37 % — ABNORMAL LOW (ref 39.0–52.0)
Hemoglobin: 12.6 g/dL — ABNORMAL LOW (ref 13.0–17.0)
O2 Saturation: 60 %
Potassium: 5.4 mmol/L — ABNORMAL HIGH (ref 3.5–5.1)
Sodium: 138 mmol/L (ref 135–145)
TCO2: 25 mmol/L (ref 22–32)
pCO2, Ven: 40.2 mm[Hg] — ABNORMAL LOW (ref 44–60)
pH, Ven: 7.382 (ref 7.25–7.43)
pO2, Ven: 32 mm[Hg] (ref 32–45)

## 2023-09-04 LAB — ECHOCARDIOGRAM COMPLETE
AR max vel: 2.11 cm2
AV Area VTI: 1.82 cm2
AV Area mean vel: 2.1 cm2
AV Mean grad: 3 mm[Hg]
AV Peak grad: 4.8 mm[Hg]
Ao pk vel: 1.09 m/s
Area-P 1/2: 5.37 cm2
Calc EF: 20 %
Height: 71 in
MV VTI: 1.2 cm2
P 1/2 time: 295 ms
S' Lateral: 5.4 cm
Single Plane A2C EF: 13.6 %
Single Plane A4C EF: 24.9 %
Weight: 2384 [oz_av]

## 2023-09-04 LAB — RESPIRATORY PANEL BY PCR

## 2023-09-04 LAB — BASIC METABOLIC PANEL
Anion gap: 17 — ABNORMAL HIGH (ref 5–15)
BUN: 51 mg/dL — ABNORMAL HIGH (ref 8–23)
CO2: 21 mmol/L — ABNORMAL LOW (ref 22–32)
Calcium: 9.1 mg/dL (ref 8.9–10.3)
Chloride: 100 mmol/L (ref 98–111)
Creatinine, Ser: 10.64 mg/dL — ABNORMAL HIGH (ref 0.61–1.24)
GFR, Estimated: 5 mL/min — ABNORMAL LOW (ref >60)
Glucose, Bld: 129 mg/dL — ABNORMAL HIGH (ref 70–99)
Potassium: 4.7 mmol/L (ref 3.5–5.1)
Sodium: 138 mmol/L (ref 135–145)

## 2023-09-04 LAB — TROPONIN I (HIGH SENSITIVITY)
Troponin I (High Sensitivity): 92 ng/L — ABNORMAL HIGH (ref <18)
Troponin I (High Sensitivity): 98 ng/L — ABNORMAL HIGH (ref <18)

## 2023-09-04 LAB — RESP PANEL BY RT-PCR (RSV, FLU A&B, COVID)  RVPGX2
Influenza A by PCR: NEGATIVE
Influenza B by PCR: NEGATIVE
Resp Syncytial Virus by PCR: NEGATIVE
SARS Coronavirus 2 by RT PCR: NEGATIVE

## 2023-09-04 LAB — BRAIN NATRIURETIC PEPTIDE: B Natriuretic Peptide: >4500 pg/mL — ABNORMAL HIGH (ref 0.0–100.0)

## 2023-09-04 LAB — ALBUMIN: Albumin: 3.6 g/dL (ref 3.5–5.0)

## 2023-09-04 LAB — HEPATITIS B SURFACE ANTIGEN: Hepatitis B Surface Ag: NONREACTIVE

## 2023-09-04 LAB — PHOSPHORUS: Phosphorus: 6.8 mg/dL — ABNORMAL HIGH (ref 2.5–4.6)

## 2023-09-04 MED ORDER — HEPARIN SODIUM (PORCINE) 1000 UNIT/ML DIALYSIS: 1500.0000 [IU] | INTRAMUSCULAR | Status: DC | PRN

## 2023-09-04 MED ORDER — ALTEPLASE 2 MG IJ SOLR
2.0000 mg | Freq: Once | INTRAMUSCULAR | Status: DC | PRN
Start: 2023-09-04 — End: 2023-09-04

## 2023-09-04 MED ORDER — CHLORHEXIDINE GLUCONATE CLOTH 2 % EX PADS: 6.0000 | MEDICATED_PAD | Freq: Every day | CUTANEOUS | Status: DC

## 2023-09-04 MED ORDER — APIXABAN 5 MG PO TABS
5.0000 mg | ORAL_TABLET | Freq: Two times a day (BID) | ORAL | Status: DC
Start: 2023-09-04 — End: 2023-09-05
  Filled 2023-09-04: qty 1

## 2023-09-04 MED ORDER — IPRATROPIUM-ALBUTEROL 0.5-2.5 (3) MG/3ML IN SOLN
3.0000 mL | Freq: Once | RESPIRATORY_TRACT | Status: AC
  Administered 2023-09-04: 3 mL via RESPIRATORY_TRACT
  Filled 2023-09-04: qty 3

## 2023-09-04 MED ORDER — LIDOCAINE-PRILOCAINE 2.5-2.5 % EX CREA: 1.0000 | TOPICAL_CREAM | CUTANEOUS | Status: DC | PRN

## 2023-09-04 MED ORDER — HYDRALAZINE HCL 25 MG PO TABS
25.0000 mg | ORAL_TABLET | Freq: Two times a day (BID) | ORAL | Status: DC
Start: 2023-09-04 — End: 2023-09-05
  Filled 2023-09-04: qty 1

## 2023-09-04 MED ORDER — HEPARIN SODIUM (PORCINE) 1000 UNIT/ML DIALYSIS: 2500.0000 [IU] | Freq: Once | INTRAMUSCULAR | Status: DC

## 2023-09-04 MED ORDER — METHYLPREDNISOLONE SODIUM SUCC 125 MG IJ SOLR
125.0000 mg | Freq: Once | INTRAMUSCULAR | Status: AC
  Administered 2023-09-04: 125 mg via INTRAVENOUS
  Filled 2023-09-04: qty 2

## 2023-09-04 MED ORDER — ROSUVASTATIN CALCIUM 5 MG PO TABS
10.0000 mg | ORAL_TABLET | Freq: Every day | ORAL | Status: DC
  Filled 2023-09-04 (×2): qty 2

## 2023-09-04 MED ORDER — ANTICOAGULANT SODIUM CITRATE 4% (200MG/5ML) IV SOLN: 5.0000 mL | Status: DC | PRN

## 2023-09-04 MED ORDER — SUCRALFATE 1 G PO TABS
1.0000 g | ORAL_TABLET | Freq: Once | ORAL | Status: AC
  Administered 2023-09-04: 1 g via ORAL
  Filled 2023-09-04: qty 1

## 2023-09-04 MED ORDER — TRIMETHOBENZAMIDE HCL 100 MG/ML IM SOLN
200.0000 mg | Freq: Once | INTRAMUSCULAR | Status: DC
  Filled 2023-09-04: qty 2

## 2023-09-04 MED ORDER — IPRATROPIUM-ALBUTEROL 0.5-2.5 (3) MG/3ML IN SOLN
3.0000 mL | Freq: Once | RESPIRATORY_TRACT | Status: AC
Start: 2023-09-04 — End: 2023-09-04
  Administered 2023-09-04: 3 mL via RESPIRATORY_TRACT
  Filled 2023-09-04: qty 3

## 2023-09-04 MED ORDER — NEPRO/CARBSTEADY PO LIQD: 237.0000 mL | ORAL | Status: DC | PRN

## 2023-09-04 MED ORDER — HEPARIN SODIUM (PORCINE) 1000 UNIT/ML IJ SOLN
INTRAMUSCULAR | Status: AC
  Administered 2023-09-04: 4000 [IU]
  Filled 2023-09-04: qty 4

## 2023-09-04 MED ORDER — HEPARIN SODIUM (PORCINE) 1000 UNIT/ML IJ SOLN
INTRAMUSCULAR | Status: AC
  Administered 2023-09-04: 6000 [IU]
  Filled 2023-09-04: qty 4

## 2023-09-04 MED ORDER — ISOSORBIDE MONONITRATE ER 30 MG PO TB24
30.0000 mg | ORAL_TABLET | Freq: Every day | ORAL | Status: DC
  Filled 2023-09-04 (×2): qty 1

## 2023-09-04 MED ORDER — PANTOPRAZOLE SODIUM 40 MG PO TBEC
40.0000 mg | DELAYED_RELEASE_TABLET | Freq: Every day | ORAL | Status: DC
  Filled 2023-09-04: qty 1

## 2023-09-04 MED ORDER — SEVELAMER CARBONATE 800 MG PO TABS
1600.0000 mg | ORAL_TABLET | Freq: Three times a day (TID) | ORAL | Status: DC
Start: 2023-09-04 — End: 2023-09-05
  Administered 2023-09-04: 1600 mg via ORAL
  Filled 2023-09-04 (×2): qty 2

## 2023-09-04 MED ORDER — LIDOCAINE HCL (PF) 1 % IJ SOLN: 5.0000 mL | INTRAMUSCULAR | Status: DC | PRN

## 2023-09-04 MED ORDER — FUROSEMIDE 10 MG/ML IJ SOLN
40.0000 mg | Freq: Once | INTRAMUSCULAR | Status: AC
  Administered 2023-09-04: 40 mg via INTRAVENOUS
  Filled 2023-09-04: qty 4

## 2023-09-04 MED ORDER — PREDNISONE 20 MG PO TABS
40.0000 mg | ORAL_TABLET | Freq: Every day | ORAL | Status: DC
  Filled 2023-09-04: qty 2

## 2023-09-04 MED ORDER — OXYBUTYNIN CHLORIDE 5 MG PO TABS
5.0000 mg | ORAL_TABLET | Freq: Once | ORAL | Status: AC
  Administered 2023-09-04: 5 mg via ORAL
  Filled 2023-09-04: qty 1

## 2023-09-04 MED ORDER — HEPARIN SODIUM (PORCINE) 1000 UNIT/ML DIALYSIS
1000.0000 [IU] | INTRAMUSCULAR | Status: DC | PRN
  Filled 2023-09-04 (×2): qty 1

## 2023-09-04 MED ORDER — PENTAFLUOROPROP-TETRAFLUOROETH EX AERO
1.0000 | INHALATION_SPRAY | CUTANEOUS | Status: DC | PRN
Start: 2023-09-04 — End: 2023-09-04

## 2023-09-04 MED ORDER — AMOXICILLIN-POT CLAVULANATE 500-125 MG PO TABS
1.0000 | ORAL_TABLET | Freq: Every day | ORAL | Status: DC
  Administered 2023-09-04: 1 via ORAL
  Filled 2023-09-04 (×2): qty 1

## 2023-09-04 MED ORDER — ONDANSETRON HCL 4 MG/2ML IJ SOLN
INTRAMUSCULAR | Status: AC
  Administered 2023-09-04: 4 mg
  Filled 2023-09-04: qty 2

## 2023-09-04 MED ORDER — MAGNESIUM SULFATE 2 GM/50ML IV SOLN
2.0000 g | Freq: Once | INTRAVENOUS | Status: AC
  Administered 2023-09-04: 2 g via INTRAVENOUS
  Filled 2023-09-04: qty 50

## 2023-09-04 MED ORDER — AMLODIPINE BESYLATE 10 MG PO TABS
10.0000 mg | ORAL_TABLET | Freq: Every day | ORAL | Status: DC
  Administered 2023-09-04: 10 mg via ORAL
  Filled 2023-09-04: qty 2
  Filled 2023-09-04: qty 1

## 2023-09-04 MED ORDER — CEFTRIAXONE SODIUM 1 G IJ SOLR
1.0000 g | Freq: Once | INTRAMUSCULAR | Status: AC
  Administered 2023-09-04: 1 g via INTRAVENOUS
  Filled 2023-09-04: qty 10

## 2023-09-04 MED ORDER — ONDANSETRON HCL 4 MG/2ML IJ SOLN
4.0000 mg | Freq: Once | INTRAMUSCULAR | Status: AC
  Administered 2023-09-04: 4 mg via INTRAVENOUS
  Filled 2023-09-04: qty 2

## 2023-09-04 MED ORDER — MORPHINE SULFATE (PF) 2 MG/ML IV SOLN
2.0000 mg | Freq: Once | INTRAVENOUS | Status: AC
  Administered 2023-09-04: 2 mg via INTRAVENOUS
  Filled 2023-09-04: qty 1

## 2023-09-04 MED ORDER — HEPARIN SODIUM (PORCINE) 5000 UNIT/ML IJ SOLN
5000.0000 [IU] | Freq: Three times a day (TID) | INTRAMUSCULAR | Status: DC
  Administered 2023-09-04: 5000 [IU] via SUBCUTANEOUS
  Filled 2023-09-04: qty 1

## 2023-09-04 MED ORDER — IPRATROPIUM-ALBUTEROL 0.5-2.5 (3) MG/3ML IN SOLN
3.0000 mL | RESPIRATORY_TRACT | Status: DC
  Administered 2023-09-04: 3 mL via RESPIRATORY_TRACT
  Filled 2023-09-04: qty 3

## 2023-09-04 MED ORDER — ONDANSETRON HCL 4 MG/2ML IJ SOLN: 4.0000 mg | Freq: Once | INTRAMUSCULAR | Status: DC

## 2023-09-04 MED ORDER — TAMSULOSIN HCL 0.4 MG PO CAPS: 0.4000 mg | ORAL_CAPSULE | Freq: Every day | ORAL | Status: DC

## 2023-09-04 NOTE — ED Notes (Signed)
Patient moved to hospital bed for comfort

## 2023-09-04 NOTE — Discharge Summary (Signed)
Name: Ronald Reeves MRN: [id number] DOB: Oct 04, 1955 68 y.o. PCP: Benjiman Core, MD  Date of Admission: 09/04/2023  3:09 AM Date of Discharge: 09/04/2023 Attending Physician: No att. 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   amLODipine 10 MG tablet Commonly known as: NORVASC Take 1 tablet (10 mg total) by mouth daily.   amoxicillin-clavulanate 250-125 MG tablet Commonly known as: AUGMENTIN Take 1 tablet by mouth 2 (two) times daily.   apixaban 5 MG Tabs tablet Commonly known as: ELIQUIS Take 1 tablet (5 mg total) by mouth 2 (two) times daily.   DIALYVITE TABLET Tabs Take 1 tablet by mouth daily.   doxycycline 100 MG tablet Commonly known as: VIBRA-TABS Take 100 mg by mouth 2 (two) times daily. Take one tablet by mouth twice a day. Avoid taking Same time as Vitamins/minerals. Take antibiotic at least two hours before or two hours after using iron.   furosemide 40 MG tablet Commonly known as: LASIX Take 40 mg by mouth daily.   hydrALAZINE 25 MG tablet Commonly known as: APRESOLINE Take 25 mg by mouth 2 (two) times daily.   isosorbide mononitrate 30 MG 24 hr tablet Commonly known as: IMDUR Take 30 mg by mouth  daily.   naloxone 4 MG/0.1ML Liqd nasal spray kit Commonly known as: NARCAN Place 0.4 mg into the nose See admin instructions.  SPRAY 1 SPRAY INTO ONE NOSTRIL AS DIRECTED AS NEEDED FOR RESCUE FOR OPIOID OVERDOSE - CALL 911 IMMEDIATELY, ADMINISTER DOSE, THEN TURN PERSON ON SIDE - IF NO RESPONSE IN 2-3 MINUTES OR PERSON RESPONDS BUT RELAPSES, REPEAT USING A NEW SPRAY DEVICE AND SPRAY INTO THE OTHER NOSTRIL AS NEEDED FOR RESCUE   omeprazole 40 MG capsule Commonly known as: PRILOSEC Take 1 capsule (40 mg total) by mouth daily.   oxyCODONE 5 MG immediate release tablet Commonly known as: Roxicodone Take 1 tablet (5 mg total) by mouth every 6 (six) hours as needed for severe pain (pain score 7-10).   PSYLLIUM PO Take by mouth.   rosuvastatin 10 MG tablet Commonly known as: CRESTOR Take 10 mg by mouth daily.   sevelamer carbonate 800 MG tablet Commonly known as: RENVELA Take 1,600 mg by mouth 3 (three) times daily with meals.   sorbitol 70 % solution Take 10 mLs by mouth 3 (three) times daily as needed (constipation).   tamsulosin 0.4 MG Caps capsule Commonly known as: FLOMAX Take 0.4 mg by mouth at bedtime.   Tiotropium Bromide-Olodaterol 2.5-2.5 MCG/ACT Aers Take 2 puffs by mouth in the morning.        Disposition and follow-up:   Mr.[name] was discharged from [facility] in Stable condition.  At the hospital follow up visit please address:  1.  Reviewed medication list  and ensure that he is going to all of his dialysis sessions.  If he does have continued respiratory symptoms consistent with COPD exacerbation he could benefit from 5 days of steroids and possibly antibiotics.  Hospital Course Patient was admitted today with acute hypoxic respiratory failure due to combination of an exacerbation of COPD and fluid overload with history of ESRD with recent missed dialysis session and HFrEF with an EF of 30 to 35%.  Supportive data over the BNP over 4500,  increased weight, and signs of pulmonary/systemic congestion on exam.  He initially required 2 L through nasal cannula for dropping O2 saturations but was able to get back on room air pretty quickly.  He was treated with IV Lasix, steroids, Augmentin, scheduled daily inhalers and as needed nebulized breathing treatments.  Our nephrology team was also consulted and the patient received HD with 2.9 L ultrafiltration.  After HD the patient was reportedly agitated and demanded to leave to the nurses on the floor.  Prior to primary team being able to respond to the nurses page the patient left AMA.  Discharge Exam:   BP 138/88   Pulse 99   Temp 98.6 F (37 C) (Oral)   Resp 20   Ht 5\' 11"  (1.803 m)   Wt 68.3 kg   SpO2 99%   BMI 21.00 kg/m  Discharge exam: Unable to examine the patient prior to leaving AMA.  Please see H&P from today's date for exam.   Signed: Rocky Morel, DO 09/04/2023, 7:19 PM   Pager: [phone]

## 2023-09-04 NOTE — Progress Notes (Signed)
Received patient in bed to unit.  Alert and oriented.  Informed consent signed and in chart.   TX duration:  Patient tolerated well.  Transported back to the room  Alert, without acute distress.  Hand-off given to patient's nurse.   Access used: R Femoral TDC Access issues: lines reversed, frequent alarms with pt movement and access position  Total UF removed: 2900cc Medication(s) given: pre and mid tx heparin bolus, post tx heparin cvc blocks   09/04/23 1752  Vitals  BP (!) 153/70  MAP (mmHg) 93  Pulse Rate 94  ECG Heart Rate 98  Resp (!) 22  Oxygen Therapy  SpO2 99 %  During Treatment Monitoring  Blood Flow Rate (mL/min) 0 mL/min  Arterial Pressure (mmHg) 36.16 mmHg  Venous Pressure (mmHg) -29.49 mmHg  TMP (mmHg) 17.77 mmHg  Ultrafiltration Rate (mL/min) 386 mL/min  Dialysate Flow Rate (mL/min) 300 ml/min  Duration of HD Treatment -hour(s) 3.5 hour(s)  Cumulative Fluid Removed (mL) per Treatment  2900.06  HD Safety Checks Performed Yes  Intra-Hemodialysis Comments Tx completed  Dialysis Fluid Bolus Normal Saline  Bolus Amount (mL) 300 mL  Post Treatment  Dialyzer Clearance Clear  Liters Processed 59  Fluid Removed (mL) 2900 mL  Tolerated HD Treatment Yes      Freddi Starr, RN [facility]

## 2023-09-04 NOTE — Progress Notes (Signed)
BIPAP PRN order placed by MD. Pt is not requiring BIPAP at this time per MD and RN. RT will monitor.

## 2023-09-04 NOTE — H&P (Addendum)
Date: 09/04/2023               Patient Name:  Ronald Reeves MRN: [id number]  DOB: 1956-04-27 Age / Sex: 68 y.o., male   PCP: Benjiman Core, MD         Medical Service: Internal Medicine Teaching Service         Attending Physician: Dr. Ginnie Smart, MD    First Contact: Dr. Carmina Miller Pager: [phone]  Second Contact: Dr. Gwenevere Abbot Pager: [phone]       After Hours (After 5p/  First Contact Pager: [phone]  weekends / holidays): Second Contact Pager: [phone]   Chief Complaint: shortness of breath  History of Present Illness:   [name] is a 68 y.o. M with PMH of HFrEF, COPD, CAD, prior MI, ESRD on TThSa HD, HTN, cocaine use who presented to [facility] ED with shortness of breath admitted for COPD and CHF exacerbations.  Ronald Reeves explains that over the last several days he has had increasing shortness of breath compared to baseline accompanied by increased cough frequency and sputum production. He was evaluated 01/19 and treated conservatively for increased dyspnea, and on 01/22 was evaluated at the [facility] urgent care where he was diagnosed with pneumonia and prescribed amoxicillin-clavulanate 250-125 mg BID on HD days following HD sessions; he took two doses yesterday.  He explains that he feels that he is carrying extra fluid and is 5-6 pounds heavier than his dry weight, noticing that this increase has happened mostly over the last several days while acutely ill. He was not able to undergo HD yesterday due to acute illness and did not have HD last Thursday either. He normally sleeps with his head elevated has had to increase the elevation in this time. He uses Vick's VapoRub on his chest and a humidifier to further help his breathing while sleeping. He is unable to exert himself very much at all due to his shortness of breath. He has been using his Stiolto inhaler 5-6 times daily over the last few days because his albuterol inhaler provided no symptomatic relief. He is not on home  oxygen. His throat feels raw from coughing so much.  He denies acute confusion but has been weak. He has had poor appetite since starting HD and struggles with eating and drinking because of nausea and vomiting; he vomits most mornings according to his wife, Fannie Knee, who is at bedside. He did have diarrhea yesterday. No abnormal taste in his mouth, just dryness. He produces urine still but since starting Stiolto has noticed a drastic decrease in amount. He had an appointment with urology 4 days ago and they placed a foley catheter with plans for follow up in about 2 weeks.  He smokes daily, one cigar, and uses cocaine daily with occasional crack use.   Notable labs: BNP >4500 (unchanged over values over last 8 months); troponin 92>98; normocytic anemia with Hgb 11.7; mild anion gap metabolic acidosis with bicarb of 21, anion gap of 17; BUN/Cr 51/10.64 and GFR 5.  He received nebulizer treatments, magnesium, solu-medrol 125 mg, ceftriaxone 1 g x1. IMTS paged for admission.  Past Medical History: Past Medical History:  Diagnosis Date   Anemia    Arthritis    Back pain    Bronchitis    COPD (chronic obstructive pulmonary disease) (HCC)    Coronary artery disease    COVID    mild - flu like symptoms   Dyspnea    w/ exertion, uses inhaler  ESRD on hemodialysis (HCC)    dialysis on tues, thurs, sat at [facility]   GERD (gastroesophageal reflux disease)    not a current problem   Heart murmur    never has caused any problems   Hypertension    Myocardial infarction (HCC)    Pneumonia    x 1   Pre-diabetes    diet controlled, no meds, does not check blood sugar   Substance abuse [facility])    Past Surgical History: Past Surgical History:  Procedure Laterality Date   A/V FISTULAGRAM Right 06/17/2023   Procedure: A/V Fistulagram;  Surgeon: Victorino Sparrow, MD;  Location: [facility] INVASIVE CV LAB;  Service: Cardiovascular;  Laterality: Right;   A/V FISTULAGRAM Right 07/17/2023   Procedure: A/V Fistulagram;   Surgeon: [name], MD;  Location: [facility] INVASIVE CV LAB;  Service: Cardiovascular;  Laterality: Right;   AV FISTULA PLACEMENT Left 04/12/2021   Procedure: LEFT ARM ARTERIOVENOUS (AV) FISTULA CREATION;  Surgeon: Cephus Shelling, MD;  Location: [facility] OR;  Service: Vascular;  Laterality: Left;   AV FISTULA PLACEMENT Left 10/18/2021   Procedure: LEFT ARM BRACHIOBASILIC FISTULA CREATION FIRST STAGE;  Surgeon: Cephus Shelling, MD;  Location: [facility] OR;  Service: Vascular;  Laterality: Left;   AV FISTULA PLACEMENT Right 11/28/2022   Procedure: RIGHT ARM FIRST STAGE BRACHIOBASILIC FISTULA CREATION;  Surgeon: Cephus Shelling, MD;  Location: [facility] OR;  Service: Vascular;  Laterality: Right;   BACK SURGERY     BASCILIC VEIN TRANSPOSITION Left 01/27/2022   Procedure: LEFT SECOND STAGE BASILIC VEIN TRANSPOSITION;  Surgeon: Cephus Shelling, MD;  Location: [facility] OR;  Service: Vascular;  Laterality: Left;   BASCILIC VEIN TRANSPOSITION Right 02/18/2023   Procedure: SECOND STAGE RIGHT ARM BASILIC VEIN TRANSPOSITION;  Surgeon: Cephus Shelling, MD;  Location[facility] OR;  Service: Vascular;  Laterality: Right;   BIOPSY  08/20/2022   Procedure: BIOPSY;  Surgeon: Shellia Cleverly, DO;  Location: [facility];  Service: Gastroenterology;;   COLONOSCOPY WITH PROPOFOL N/A 08/20/2022   Procedure: COLONOSCOPY WITH PROPOFOL;  Surgeon: Shellia Cleverly, DO;  Location: [facility];  Service: Gastroenterology;  Laterality: N/A;   DIALYSIS/PERMA CATHETER INSERTION Right 03/05/2023   Procedure: DIALYSIS/PERMA CATHETER INSERTION;  Surgeon: Cephus Shelling, MD;  Location: [facility] INVASIVE CV LAB;  Service: Cardiovascular;  Laterality: Right;   ESOPHAGOGASTRODUODENOSCOPY (EGD) WITH PROPOFOL N/A 08/20/2022   Procedure: ESOPHAGOGASTRODUODENOSCOPY (EGD) WITH PROPOFOL;  Surgeon: Shellia Cleverly, DO;  Location: [facility];  Service: Gastroenterology;  Laterality: N/A;   INSERTION OF DIALYSIS CATHETER Right 05/01/2022    Procedure: INSERTION OF DIALYSIS CATHETER;  Surgeon: [name], MD;  Location: [facility] OR;  Service: Vascular;  Laterality: Right;   INSERTION OF DIALYSIS CATHETER Right 07/10/2023   Procedure: ULTRASOUNDED GUIDED INSERTION OF TUNNELED  DIALYSIS CATHETER TO RIGHT FEMORAL VEIN;  Surgeon: Cephus Shelling, MD;  Location: [facility] OR;  Service: Vascular;  Laterality: Right;   IR REMOVAL TUN CV CATH W/O FL  12/19/2022   LIGATION OF COMPETING BRANCHES OF ARTERIOVENOUS FISTULA Left 07/15/2021   Procedure: LIGATION OF COMPETING BRANCHES OF LEFT RADIOCEPHALIC ARTERIOVENOUS FISTULA TIMES TWO;  Surgeon: Cephus Shelling, MD;  Location: [facility] OR;  Service: Vascular;  Laterality: Left;   PERIPHERAL VASCULAR BALLOON ANGIOPLASTY  07/17/2023   Procedure: PERIPHERAL VASCULAR BALLOON ANGIOPLASTY;  Surgeon: [name], MD;  Location: [facility] INVASIVE CV LAB;  Service: Cardiovascular;;   PERIPHERAL VASCULAR INTERVENTION  06/17/2023   Procedure: PERIPHERAL VASCULAR INTERVENTION;  Surgeon: Karin Lieu,  Clementeen Graham, MD;  Location: [facility] INVAS[facility] CV LAB;  Service: Cardiovascular;;   POLYPECTOMY  08/20/2022   Procedure: POLYPECTOMY;  Surgeon: Shellia Cleverly, DO;  Location: [facility];  Service: Gastroenterology;;   REVISON OF ARTERIOVENOUS FISTULA Left 07/15/2021   Procedure: REVISON OF LEFT ARTERIOVENOUS FISTULA;  Surgeon: Cephus Shelling, MD;  Location: [facility] OR;  Service: Vascular;  Laterality: Left;   REVISON OF ARTERIOVENOUS FISTULA Left 05/01/2022   Procedure: ARTERIOVENOUS FISTULA WASHOUT OF ARM HEMATOMA;  Surgeon: [name], MD;  Location: [facility] OR;  Service: Vascular;  Laterality: Left;   UPPER EXTREMITY VENOGRAPHY Bilateral 08/28/2023   Procedure: UPPER EXTREMITY VENOGRAPHY;  Surgeon: [name], MD;  Location: [facility] INVASIVE CV LAB;  Service: Cardiovascular;  Laterality: Bilateral;   Meds:  Current Facility-Administered Medications for the 09/04/23 encounter [facility] Encounter)  Medication   sodium chloride  flush (NS) 0.9 % injection 3 mL   sodium chloride flush (NS) 0.9 % injection 3 mL   sodium chloride flush (NS) 0.9 % injection 3 mL   Current Meds  Medication Sig   apixaban (ELIQUIS) 5 MG TABS tablet Take 1 tablet (5 mg total) by mouth 2 (two) times daily.   doxycycline (VIBRA-TABS) 100 MG tablet Take 100 mg by mouth 2 (two) times daily. Take one tablet by mouth twice a day. Avoid taking Same time as Vitamins/minerals. Take antibiotic at least two hours before or two hours after using iron.   furosemide (LASIX) 40 MG tablet Take 40 mg by mouth daily.   hydrALAZINE (APRESOLINE) 25 MG tablet Take 25 mg by mouth 2 (two) times daily.   isosorbide mononitrate (IMDUR) 30 MG 24 hr tablet Take 30 mg by mouth daily.   omeprazole (PRILOSEC) 40 MG capsule Take 1 capsule (40 mg total) by mouth daily.   PSYLLIUM PO Take by mouth.   rosuvastatin (CRESTOR) 10 MG tablet Take 10 mg by mouth daily.   sevelamer carbonate (RENVELA) 800 MG tablet Take 1,600 mg by mouth 3 (three) times daily with meals.   tamsulosin (FLOMAX) 0.4 MG CAPS capsule Take 0.4 mg by mouth at bedtime.   Tiotropium Bromide-Olodaterol 2.5-2.5 MCG/ACT AERS Take 2 puffs by mouth in the morning.   Allergies: Allergies as of 09/04/2023 - Review Complete 09/04/2023  Allergen Reaction Noted   Gabapentin  03/19/2023   Iodinated contrast media Nausea And Vomiting 06/15/2023   Family History:  Family History  Problem Relation Age of Onset   Heart disease Mother    Heart attack Sister 27   Social History: PCP is through the [place]. He has not consumed alcohol in 2-3 months. He smokes 1 cigar daily, previously smoked 5 cigars daily starting around age 42. He uses cocaine daily, occasionally uses crack. Independent in ADLs and IADLs.   Review of Systems: A complete ROS was negative except as per HPI.   Physical Exam: Blood pressure (!) 156/85, pulse 95, temperature 97.8 F (36.6 C), temperature source Oral, resp. rate 20, height 5\' 11"  (1.803  m), weight 67.6 kg, SpO2 99%. Physical Exam Constitutional:      General: He is in acute distress.     Appearance: He is ill-appearing.  Cardiovascular:     Rate and Rhythm: Normal rate and regular rhythm.     Comments: Difficult to assess for JVD given tachypnea and accessory muscle use. Pulmonary:     Effort: Tachypnea and accessory muscle usage present.     Breath sounds: Examination of the right-middle field reveals rales. Examination  of the left-middle field reveals rales. Examination of the right-lower field reveals rales. Examination of the left-lower field reveals rales. Wheezing and rales present.  Abdominal:     Tenderness: There is no abdominal tenderness. There is no guarding.  Musculoskeletal:     Right lower leg: No edema.     Left lower leg: No edema.     Comments: Trace-1+ pitting edema to bilateral LE.  Skin:    General: Skin is warm and dry.  Neurological:     General: No focal deficit present.     Mental Status: He is alert.  Psychiatric:        Mood and Affect: Mood is anxious.    EKG: NSR with evidence of biatrial enlargement, possible LVH, prolonged QTc.  CXR: Cardiomegaly with pulmonary vascular congestion.  Assessment & Plan by Problem: Principal Problem:   Acute exacerbation of CHF (congestive heart failure) (HCC) Active Problems:   Essential hypertension   Benign prostatic hyperplasia   Coronary artery disease   History of DVT (deep vein thrombosis)   History of CVA (cerebrovascular accident)   ESRD (end stage renal disease) (HCC)   COPD exacerbation (HCC)  HFrEF (LV EF 30-35%, LV global hypokinesis) Clinically hypervolemic and subjectively reports that he feels like he is holding on to extra fluid and is 5-6 pounds over his dry weight. BNP >4500 which it has been when checked over the last 8 months; he does have ESRD, COPD, and is thin, which could be factors in marked elevation though I do feel that he is in a HF exacerbation. He still makes  urine though somewhat decreased amounts since starting Stiolto, and does have a foley catheter in place; has plans for urology follow-up in the next 2 weeks. I am concerned that his presentation is related to heart failure in addition to COPD exacerbation. GDMT limited by ESRD. Plan: -IV lasix 40 mg x1, redose if indicated -Strict I's & O's, daily weights -Nephrology to assess and schedule inpatient HD, appreciate their assistance -Echocardiogram  COPD exacerbation Recently diagnosed with pneumonia at the [facility] and discharged with amoxicillin-clavulanate 250-125 mg BID on HD days. He reports some improvement of symptoms since arrival to the ED and treatments thus far. He has persistence of expiratory wheezing on my exam as well as bibasilar crackles. Negative for COVID-19, influenza, RSV. No measured fever since acute illness began and no leukocytosis. He does have a prolonged Qtc. Fortunately oxygen is stable on [place], though he does have continued increased work of breathing. VBG overall unremarkable. Plan: -F/u RVP -Scheduled DuoNebs q4h, hold home stiolto and albuterol for now -Prednisone 40 mg daily for 4 days starting 01/25 -Amoxicillin-clavulanate 500-125 mg daily, ensure dose given following HD on HD days -BiPAP PRN  ESRD on TThSa HD Oliguria Has missed two HD sessions over the last two weeks, most recently missed yesterday. Renal function reflects missed HD sessions. Some symptoms concerning for uremia including nausea with vomiting but no other symptoms. Hypervolemic in setting of acute CHF exacerbation and missed HD sessions. Nephrology consulted by EDP who will assess and assist with HD coordination while admitted. Plan: -Nephrology consulted, appreciate their recommendations -Trend renal function -Continue home renvela 1600 mg TID with meals  Anion gap metabolic acidosis Mild, do not suspect uremia as primary cause, he is not acidotic. Poor PO intake complicated by nausea and vomiting  so perhaps a component of starvation ketosis. Plan: -Trend BMP  Hx DVT Plan: -Continue home eliquis 5 mg BID  HTN  Plan: -Continue home amlodipine 10 mg daily, hydralazine 25 mg BID, imdur 30 mg daily  BPPH Plan: -Continue home tamsulosin 0.4 mg daily at bedtime  HLD CAD Hx MI Hx CVA Plan: -Continue home rosuvastatin 10 mg daily, eliquis 5 mg BID  Dispo: Admit patient to Inpatient with expected length of stay greater than 2 midnights.  SignedChamp Mungo, DO 09/04/2023, 12:42 PM  After 5pm on weekdays and 1pm on weekends: On Call pager: [phone]

## 2023-09-04 NOTE — Progress Notes (Signed)
Pt. Approached nursing station, yelling and demanding to have IV removed, pt. Made aware that no discharge orders have been written and that MD was contacted to obtain further information about discharge planning, pt. Became very angry and demanded to leave, explained to patient that he would have to sign out against medical advice, pt. Made aware of potential risks and pt. Verbalized understanding, still insisting to leave, signed AMA paperwork, MD aware, pt. And wife started arguing in hallway, pt. Grabbing car keys from wife, security called for escort but pt. And wife proceeded to leave via elevator, security made aware of pt and wife leaving the building.  Margarita Grizzle

## 2023-09-04 NOTE — ED Notes (Signed)
Patient has had multiple episodes of vomiting, IM resident paged for meds.

## 2023-09-04 NOTE — ED Provider Notes (Signed)
[facility] Provider Note  CSN: [id number] Arrival date & time: 09/04/23 4098  Chief Complaint(s) Pneumonia and Shortness of Breath  HPI Ronald Reeves is a 67 y.o. male with past medical history as below, significant for ESRD on HD Tuesday Thursday Saturday, GERD, prediabetes, polysubstance abuse, COPD, tobacco use who presents to the ED with complaint of dyspnea, cough  Been feeling unwell for around a week, started on antibiotics outpatient for presumed pneumonia.  Last dialysis on Tuesday.  Went to dialysis on Thursday but was turned away because he was feeling unwell.  Reports he has gained approximately 6 kg from his dry weight which is around 60-65 kg.  Worsening cough, intermittently productive with yellow or white sputum.  Exertional dyspnea.  Home oxygen use.  Has been using his inhaler more frequently without improvement to his symptoms.  No fevers or chills.  He has Foley catheter in place.  Past Medical History Past Medical History:  Diagnosis Date   Anemia    Arthritis    Back pain    Bronchitis    COPD (chronic obstructive pulmonary disease) (HCC)    Coronary artery disease    COVID    mild - flu like symptoms   Dyspnea    w/ exertion, uses inhaler   ESRD on hemodialysis (HCC)    dialysis on tues, thurs, sat at [facility]   GERD (gastroesophageal reflux disease)    not a current problem   Heart murmur    never has caused any problems   Hypertension    Myocardial infarction (HCC)    Pneumonia    x 1   Pre-diabetes    diet controlled, no meds, does not check blood sugar   Substance abuse (HCC)    Patient Active Problem List   Diagnosis Date Noted   COPD exacerbation (HCC) 09/04/2023   Acute exacerbation of CHF (congestive heart failure) (HCC) 09/04/2023   Hypervolemia 07/08/2023   Acute respiratory failure with hypoxia (HCC) 07/08/2023   Acute hypoxemic respiratory failure (HCC) 07/08/2023   Acute pulmonary edema (HCC) 07/08/2023   Acute  encephalopathy 03/19/2023   Gabapentin-induced toxicity 03/19/2023   ESRD (end stage renal disease) (HCC) 03/19/2023   Acute metabolic encephalopathy 03/14/2023   Elevated troponin 03/14/2023   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/14/2023   Cocaine use 03/14/2023   History of DVT (deep vein thrombosis) 03/14/2023   History of CVA (cerebrovascular accident) 03/14/2023   Displacement of vascular dialysis catheter (HCC) 03/05/2023   Acute on chronic HFrEF (heart failure with reduced ejection fraction) (HCC) 12/17/2022   Enterococcal bacteremia 12/17/2022   Orthostatic hypotension 12/06/2022   Polyp of sigmoid colon 08/20/2022   Diverticulosis of colon without hemorrhage 08/20/2022   Change in stool habits 08/20/2022   Gastritis and gastroduodenitis 08/20/2022   Nausea and vomiting 08/20/2022   GERD without esophagitis 08/20/2022   Failure to thrive in adult 06/17/2022   ESRD (end stage renal disease) on dialysis TTS schedule (HCC) 01/07/2022   Alcohol dependence (HCC) 10/14/2021   Cervicalgia 10/14/2021   Cocaine dependence, continuous (HCC) 10/14/2021   Noncompliance with medication regimen 10/14/2021   Knee pain 10/14/2021   Osteoarthritis 10/14/2021   Tobacco abuse 10/14/2021   Acute upper respiratory infection, unspecified 10/14/2021   Age-related nuclear cataract, bilateral 10/14/2021   Benign prostatic hyperplasia 10/14/2021   Cannabis dependence (HCC) 10/14/2021   Combinations of drug dependence excluding opioid type drug, abuse (HCC) 10/14/2021   Coronary artery disease 10/14/2021  COVID-19 10/14/2021   Dyspnea on exertion 10/14/2021   Elevated PSA 10/14/2021   Encounter for screening for malignant neoplasm of respiratory organs 10/14/2021   Injury, other and unspecified, finger 10/14/2021   Left ventricular hypertrophy 10/14/2021   Legal circumstance 10/14/2021   Other and unspecified hyperlipidemia 10/14/2021   Pain in right foot 10/14/2021    Peripheral edema 10/14/2021   Polysubstance abuse (HCC) 10/14/2021   Prediabetes 10/14/2021   Recurrent major depressive disorder (HCC) 10/14/2021   Thoracic or lumbosacral neuritis or radiculitis 10/14/2021   Vitamin D deficiency 10/14/2021   ARF (acute renal failure) (HCC) 07/18/2020   Anemia of chronic disease 07/18/2020   Proteinuria 01/25/2020   Chest pain 08/20/2017   Chronic back pain 08/20/2017   Cervical spondylosis without myelopathy 08/05/2017   Neural foraminal stenosis of cervical spine 08/05/2017   Peripheral neuropathy 05/30/2013   Chronic kidney disease 05/30/2013   Essential hypertension 05/30/2013   Cervical radiculopathy 05/30/2013   Acute exacerbation of chronic low back pain 05/30/2013   Dental caries 05/30/2013   Gastroesophageal reflux disease 08/12/2011   Home Medication(s) Prior to Admission medications   Medication Sig Start Date End Date Taking? Authorizing Provider  apixaban (ELIQUIS) 5 MG TABS tablet Take 1 tablet (5 mg total) by mouth 2 (two) times daily. 03/06/23  Yes Atway, Rayann N, DO  doxycycline (VIBRA-TABS) 100 MG tablet Take 100 mg by mouth 2 (two) times daily. Take one tablet by mouth twice a day. Avoid taking Same time as Vitamins/minerals. Take antibiotic at least two hours before or two hours after using iron. 09/01/23  Yes [provider]  furosemide (LASIX) 40 MG tablet Take 40 mg by mouth daily.   Yes [provider]  hydrALAZINE (APRESOLINE) 25 MG tablet Take 25 mg by mouth 2 (two) times daily.   Yes [provider]  isosorbide mononitrate (IMDUR) 30 MG 24 hr tablet Take 30 mg by mouth daily.   Yes [provider]  omeprazole (PRILOSEC) 40 MG capsule Take 1 capsule (40 mg total) by mouth daily. 06/06/22  Yes Arnaldo Natal, NP  PSYLLIUM PO Take by mouth. 09/03/23  Yes [provider]  rosuvastatin (CRESTOR) 10 MG tablet Take 10 mg by mouth daily.   Yes [provider]  sevelamer  carbonate (RENVELA) 800 MG tablet Take 1,600 mg by mouth 3 (three) times daily with meals. 01/06/23  Yes [provider]  tamsulosin (FLOMAX) 0.4 MG CAPS capsule Take 0.4 mg by mouth at bedtime.   Yes [provider]  Tiotropium Bromide-Olodaterol 2.5-2.5 MCG/ACT AERS Take 2 puffs by mouth in the morning. 08/07/23  Yes [provider]  albuterol (VENTOLIN HFA) 108 (90 Base) MCG/ACT inhaler Inhale 2 puffs into the lungs every 6 (six) hours as needed (COPD).    [provider]  amLODipine (NORVASC) 10 MG tablet Take 1 tablet (10 mg total) by mouth daily. 01/01/18   Lewayne Bunting, MD  amoxicillin-clavulanate (AUGMENTIN) 250-125 MG tablet Take 1 tablet by mouth 2 (two) times daily. 09/01/23   [provider]  B Complex-C-Folic Acid (DIALYVITE TABLET) TABS Take 1 tablet by mouth daily. 12/23/22   [provider]  naloxone [facility]) nasal spray 4 mg/0.1 mL Place 0.4 mg into the nose See admin instructions.  SPRAY 1 SPRAY INTO ONE NOSTRIL AS DIRECTED AS NEEDED FOR RESCUE FOR OPIOID OVERDOSE - CALL 911 IMMEDIATELY, ADMINISTER DOSE, THEN TURN PERSON ON SIDE - IF NO RESPONSE IN 2-3 MINUTES OR PERSON RESPONDS BUT  RELAPSES, REPEAT USING A NEW SPRAY DEVICE AND SPRAY INTO THE OTHER NOSTRIL AS NEEDED FOR RESCUE 01/21/23   [provider]  oxyCODONE (ROXICODONE) 5 MG immediate release tablet Take 1 tablet (5 mg total) by mouth every 6 (six) hours as needed for severe pain (pain score 7-10). Patient not taking: Reported on 08/26/2023 07/10/23   Baglia, Corrina, PA-C  sorbitol 70 % solution Take 10 mLs by mouth 3 (three) times daily as needed (constipation). Patient not taking: Reported on 08/26/2023 01/20/23   [provider]                                                                                                                                    Past Surgical History Past Surgical History:  Procedure Laterality Date   A/V FISTULAGRAM Right  06/17/2023   Procedure: A/V Fistulagram;  Surgeon: Victorino Sparrow, MD;  Location: [facility] INVASIVE CV LAB;  Service: Cardiovascular;  Laterality: Right;   A/V FISTULAGRAM Right 07/17/2023   Procedure: A/V Fistulagram;  Surgeon: [name], MD;  Location: [facility] INVASIVE CV LAB;  Service: Cardiovascular;  Laterality: Right;   AV FISTULA PLACEMENT Left 04/12/2021   Procedure: LEFT ARM ARTERIOVENOUS (AV) FISTULA CREATION;  Surgeon: Cephus Shelling, MD;  Location: [facility] OR;  Service: Vascular;  Laterality: Left;   AV FISTULA PLACEMENT Left 10/18/2021   Procedure: LEFT ARM BRACHIOBASILIC FISTULA CREATION FIRST STAGE;  Surgeon: Cephus Shelling, MD;  Location: [facility] OR;  Service: Vascular;  Laterality: Left;   AV FISTULA PLACEMENT Right 11/28/2022   Procedure: RIGHT ARM FIRST STAGE BRACHIOBASILIC FISTULA CREATION;  Surgeon: Cephus Shelling, MD;  Location: [facility] OR;  Service: Vascular;  Laterality: Right;   BACK SURGERY     BASCILIC VEIN TRANSPOSITION Left 01/27/2022   Procedure: LEFT SECOND STAGE BASILIC VEIN TRANSPOSITION;  Surgeon: Cephus Shelling, MD;  Location: [facility] OR;  Service: Vascular;  Laterality: Left;   BASCILIC VEIN TRANSPOSITION Right 02/18/2023   Procedure: SECOND STAGE RIGHT ARM BASILIC VEIN TRANSPOSITION;  Surgeon: Cephus Shelling, MD;  Location: [facility] OR;  Service: Vascular;  Laterality: Right;   BIOPSY  08/20/2022   Procedure: BIOPSY;  Surgeon: Shellia Cleverly, DO;  Location: [facility]COPY;  Service: Gastroenterology;;   COLONOSCOPY WITH PROPOFOL N/A 08/20/2022   Procedure: COLONOSCOPY WITH PROPOFOL;  Surgeon: Shellia Cleverly, DO;  Location: [facility];  Service: Gastroenterology;  Laterality: N/A;   DIALYSIS/PERMA CATHETER INSERTION Right 03/05/2023   Procedure: DIALYSIS/PERMA CATHETER INSERTION;  Surgeon: Cephus Shelling, MD;  Location: [facility] INVASIVE CV LAB;  Service: Cardiovascular;  Laterality: Right;   ESOPHAGOGASTRODUODENOSCOPY (EGD) WITH PROPOFOL N/A 08/20/2022    Procedure: ESOPHAGOGASTRODUODENOSCOPY (EGD) WITH PROPOFOL;  Surgeon: Shellia Cleverly, DO;  Location: [facility];  Service: Gastroenterology;  Laterality: N/A;   INSERTION OF DIALYSIS CATHETER Right 05/01/2022   Procedure: INSERTION OF DIALYSIS CATHETER;  Surgeon: [name], MD;  Location: [facility] OR;  Service: Vascular;  Laterality: Right;   INSERTION OF DIALYSIS CATHETER Right 07/10/2023   Procedure: ULTRASOUNDED GUIDED INSERTION OF TUNNELED  DIALYSIS CATHETER TO RIGHT FEMORAL VEIN;  Surgeon: Cephus Shelling, MD;  Location: [facility] OR;  Service: Vascular;  Laterality: Right;   IR REMOVAL TUN CV CATH W/O FL  12/19/2022   LIGATION OF COMPETING BRANCHES OF ARTERIOVENOUS FISTULA Left 07/15/2021   Procedure: LIGATION OF COMPETING BRANCHES OF LEFT RADIOCEPHALIC ARTERIOVENOUS FISTULA TIMES TWO;  Surgeon: Cephus Shelling, MD;  Location: [facility] OR;  Service: Vascular;  Laterality: Left;   PERIPHERAL VASCULAR BALLOON ANGIOPLASTY  07/17/2023   Procedure: PERIPHERAL VASCULAR BALLOON ANGIOPLASTY;  Surgeon: [name], MD;  Location: [facility] INVASIVE CV LAB;  Service: Cardiovascular;;   PERIPHERAL VASCULAR INTERVENTION  06/17/2023   Procedure: PERIPHERAL VASCULAR INTERVENTION;  Surgeon: Victorino Sparrow, MD;  Location: [facility] INVASIVE CV LAB;  Service: Cardiovascular;;   POLYPECTOMY  08/20/2022   Procedure: POLYPECTOMY;  Surgeon: Shellia Cleverly, DO;  Location: [facility];  Service: Gastroenterology;;   REVISON OF ARTERIOVENOUS FISTULA Left 07/15/2021   Procedure: REVISON OF LEFT ARTERIOVENOUS FISTULA;  Surgeon: Cephus Shelling, MD;  Location: [facility] OR;  Service: Vascular;  Laterality: Left;   REVISON OF ARTERIOVENOUS FISTULA Left 05/01/2022   Procedure: ARTERIOVENOUS FISTULA WASHOUT OF ARM HEMATOMA;  Surgeon: [name], MD;  Location: [facility] OR;  Service: Vascular;  Laterality: Left;   UPPER EXTREMITY VENOGRAPHY Bilateral 08/28/2023   Procedure: UPPER EXTREMITY VENOGRAPHY;  Surgeon: [name],  MD;  Location: [facility] INVASIVE CV LAB;  Service: Cardiovascular;  Laterality: Bilateral;   Family History Family History  Problem Relation Age of Onset   Heart disease Mother    Heart attack Sister 40    Social History Social History   Tobacco Use   Smoking status: Every Day    Current packs/day: 0.50    Types: Cigars, Cigarettes    Last attempt to quit: 2019    Passive exposure: Past   Smokeless tobacco: Never   Tobacco comments:    1-2 per day -  black and mild cigars  Vaping Use   Vaping status: Never Used  Substance Use Topics   Alcohol use: Yes    Alcohol/week: 14.0 standard drinks of alcohol    Types: 14 Cans of beer per week    Comment: Daily one-two beers per day   Drug use: Yes    Frequency: 1.0 times per week    Types: Marijuana, Cocaine    Comment: marijuana last use 02/14/23, cocaine - no longer using per pt 11/27/22 once a week (11/19/22)   Allergies Gabapentin and Iodinated contrast media  Review of Systems Review of Systems  Constitutional:  Negative for chills and fever.  HENT:  Positive for congestion and rhinorrhea.   Respiratory:  Positive for cough, chest tightness and shortness of breath.   Cardiovascular:  Negative for chest pain and palpitations.  Gastrointestinal:  Positive for nausea. Negative for abdominal pain, diarrhea and vomiting.  Genitourinary:  Positive for difficulty urinating.  Musculoskeletal:  Positive for arthralgias.  Neurological:  Negative for numbness and headaches.  All other systems reviewed and are negative.   Physical Exam Vital Signs  I have reviewed the triage vital signs BP 138/88   Pulse 99   Temp 98.6 F (37 C) (Oral)   Resp 20   Ht 5\' 11"  (1.803 m)   Wt 68.3 kg   SpO2 99%   BMI 21.00 kg/m  Physical Exam Vitals and  nursing note reviewed.  Constitutional:      General: He is not in acute distress.    Appearance: He is well-developed.     Comments: frail  HENT:     Head: Normocephalic and atraumatic.      Right Ear: External ear normal.     Left Ear: External ear normal.     Mouth/Throat:     Mouth: Mucous membranes are moist.  Eyes:     General: No scleral icterus. Cardiovascular:     Rate and Rhythm: Normal rate and regular rhythm.     Pulses: Normal pulses.     Heart sounds: Normal heart sounds.  Pulmonary:     Effort: Pulmonary effort is normal. Tachypnea present. No respiratory distress.     Breath sounds: Wheezing present.     Comments: Diffuse wheezing  Conversational dyspnea Abdominal:     General: Abdomen is flat.     Palpations: Abdomen is soft.     Tenderness: There is no abdominal tenderness.  Musculoskeletal:     Cervical back: No rigidity.     Right lower leg: No edema.     Left lower leg: No edema.     Comments: RUE fistula, palpable thrill   Skin:    General: Skin is warm and dry.     Capillary Refill: Capillary refill takes less than 2 seconds.  Neurological:     Mental Status: He is alert.  Psychiatric:        Mood and Affect: Mood normal.        Behavior: Behavior normal.     ED Results and Treatments Labs (all labs ordered are listed, but only abnormal results are displayed) Labs Reviewed  BASIC METABOLIC PANEL - Abnormal; Notable for the following components:      Result Value   CO2 21 (*)    Glucose, Bld 129 (*)    BUN 51 (*)    Creatinine, Ser 10.64 (*)    GFR, Estimated 5 (*)    Anion gap 17 (*)    All other components within normal limits  CBC WITH DIFFERENTIAL/PLATELET - Abnormal; Notable for the following components:   RBC 4.20 (*)    Hemoglobin 11.7 (*)    HCT 36.1 (*)    RDW 17.7 (*)    All other components within normal limits  BRAIN NATRIURETIC PEPTIDE - Abnormal; Notable for the following components:   B Natriuretic Peptide >4,500.0 (*)    All other components within normal limits  PHOSPHORUS - Abnormal; Notable for the following components:   Phosphorus 6.8 (*)    All other components within normal limits  I-STAT VENOUS  BLOOD GAS, ED - Abnormal; Notable for the following components:   pCO2, Ven 40.2 (*)    Potassium 5.4 (*)    Calcium, Ion 0.95 (*)    HCT 37.0 (*)    Hemoglobin 12.6 (*)    All other components within normal limits  TROPONIN I (HIGH SENSITIVITY) - Abnormal; Notable for the following components:   Troponin I (High Sensitivity) 92 (*)    All other components within normal limits  TROPONIN I (HIGH SENSITIVITY) - Abnormal; Notable for the following components:   Troponin I (High Sensitivity) 98 (*)    All other components within normal limits  RESP PANEL BY RT-PCR (RSV, FLU A&B, COVID)  [id number]  RESPIRATORY PANEL BY PCR  ALBUMIN  HEPATITIS B SURFACE ANTIGEN  HEPATITIS B SURFACE ANTIBODY, QUANTITATIVE  Radiology ECHOCARDIOGRAM COMPLETE Result Date: 09/04/2023    ECHOCARDIOGRAM REPORT   Patient Name:   LYNDELL ALLAIRE Date of Exam: 09/04/2023 Medical Rec #:  [id number]  Height:       71.0 in Accession #:    [id number] Weight:       149.0 lb Date of Birth:  1955-12-24  BSA:          1.861 m Patient Age:    67 years   BP:           163/78 mmHg Patient Gender: M          HR:           92 bpm. Exam Location:  Inpatient Procedure: 2D Echo, Cardiac Doppler and Color Doppler Indications:    CHF  History:        Patient has prior history of Echocardiogram examinations, most                 recent 12/19/2022. CHF, CAD, COPD; Risk Factors:Hypertension.  Sonographer:    Amy Chionchio Referring Phys: [id number] Symir Mah A Daniah Zaldivar IMPRESSIONS  1. Left ventricular ejection fraction, by estimation, is 20 to 25%. The left ventricle has severely decreased function. The left ventricle demonstrates global hypokinesis. Left ventricular diastolic parameters are consistent with Grade III diastolic dysfunction (restrictive).  2. Systolic function is reduced at the apex and spared at the base. Right ventricular systolic  function is moderately reduced. The right ventricular size is normal. There is severely elevated pulmonary artery systolic pressure.  3. Left atrial size was severely dilated.  4. Right atrial size was severely dilated.  5. The mitral valve is normal in structure. Mild to moderate mitral valve regurgitation. Mild mitral stenosis. The mean mitral valve gradient is 4.0 mmHg.  6. Tricuspid valve regurgitation is moderate.  7. The aortic valve is tricuspid. Aortic valve regurgitation is mild. No aortic stenosis is present.  8. The inferior vena cava is dilated in size with <50% respiratory variability, suggesting right atrial pressure of 15 mmHg. FINDINGS  Left Ventricle: Left ventricular ejection fraction, by estimation, is 20 to 25%. The left ventricle has severely decreased function. The left ventricle demonstrates global hypokinesis. The left ventricular internal cavity size was normal in size. There is no left ventricular hypertrophy. Left ventricular diastolic parameters are consistent with Grade III diastolic dysfunction (restrictive). Right Ventricle: Systolic function is reduced at the apex and spared at the base. The right ventricular size is normal. No increase in right ventricular wall thickness. Right ventricular systolic function is moderately reduced. There is severely elevated  pulmonary artery systolic pressure. The tricuspid regurgitant velocity is 3.52 m/s, and with an assumed right atrial pressure of 15 mmHg, the estimated right ventricular systolic pressure is 64.6 mmHg. Left Atrium: Left atrial size was severely dilated. Right Atrium: Right atrial size was severely dilated. Pericardium: Trivial pericardial effusion is present. Mitral Valve: The mitral valve is normal in structure. Mild to moderate mitral valve regurgitation. Mild mitral valve stenosis. MV peak gradient, 10.1 mmHg. The mean mitral valve gradient is 4.0 mmHg with average heart rate of 95 bpm. Tricuspid Valve: The tricuspid valve is  normal in structure. Tricuspid valve regurgitation is moderate . No evidence of tricuspid stenosis. Aortic Valve: The aortic valve is tricuspid. Aortic valve regurgitation is mild. Aortic regurgitation PHT measures 295 msec. No aortic stenosis is present. Aortic valve mean gradient measures 3.0 mmHg. Aortic valve peak gradient measures 4.8 mmHg. Aortic  valve area, by VTI measures 1.82  cm. Pulmonic Valve: The pulmonic valve was normal in structure. Pulmonic valve regurgitation is not visualized. No evidence of pulmonic stenosis. Aorta: The aortic root is normal in size and structure. Venous: The inferior vena cava is dilated in size with less than 50% respiratory variability, suggesting right atrial pressure of 15 mmHg. IAS/Shunts: No atrial level shunt detected by color flow Doppler.  LEFT VENTRICLE PLAX 2D LVIDd:         5.70 cm LVIDs:         5.40 cm LV PW:         1.20 cm LV IVS:        0.90 cm LVOT diam:     2.20 cm LV SV:         32 LV SV Index:   17 LVOT Area:     3.80 cm  LV Volumes (MOD) LV vol d, MOD A2C: 184.0 ml LV vol d, MOD A4C: 189.0 ml LV vol s, MOD A2C: 159.0 ml LV vol s, MOD A4C: 142.0 ml LV SV MOD A2C:     25.0 ml LV SV MOD A4C:     189.0 ml LV SV MOD BP:      37.9 ml RIGHT VENTRICLE          IVC RV Basal diam:  4.10 cm  IVC diam: 2.20 cm RV Mid diam:    2.70 cm TAPSE (M-mode): 1.5 cm LEFT ATRIUM              Index        RIGHT ATRIUM           Index LA Vol (A2C):   118.0 ml 63.42 ml/m  RA Area:     26.00 cm LA Vol (A4C):   96.7 ml  51.97 ml/m  RA Volume:   89.20 ml  47.94 ml/m LA Biplane Vol: 114.0 ml 61.27 ml/m  AORTIC VALVE                    PULMONIC VALVE AV Area (Vmax):    2.11 cm     PV Vmax:       0.83 m/s AV Area (Vmean):   2.10 cm     PV Peak grad:  2.8 mmHg AV Area (VTI):     1.82 cm AV Vmax:           109.00 cm/s AV Vmean:          76.600 cm/s AV VTI:            0.174 m AV Peak Grad:      4.8 mmHg AV Mean Grad:      3.0 mmHg LVOT Vmax:         60.60 cm/s LVOT Vmean:         42.400 cm/s LVOT VTI:          0.084 m LVOT/AV VTI ratio: 0.48 AI PHT:            295 msec  AORTA Ao Root diam: 2.80 cm Ao Asc diam:  2.90 cm MITRAL VALVE              TRICUSPID VALVE MV Area (PHT): 5.37 cm   TR Peak grad:   49.6 mmHg MV Area VTI:   1.20 cm   TR Vmax:        352.00 cm/s MV Peak grad:  10.1 mmHg MV Mean grad:  4.0 mmHg   SHUNTS MV Vmax:  1.59 m/s   Systemic VTI:  0.08 m MV Vmean:      85.9 cm/s  Systemic Diam: 2.20 cm Chilton Si MD Electronically signed by Chilton Si MD Signature Date/Time: 09/04/2023/5:04:50 PM    Final     Pertinent labs & imaging results that were available during my care of the patient were reviewed by me and considered in my medical decision making (see MDM for details).  Medications Ordered in ED Medications  ipratropium-albuterol (DUONEB) 0.5-2.5 (3) MG/3ML nebulizer solution 3 mL (3 mLs Nebulization Given 09/04/23 0503)  methylPREDNISolone sodium succinate (SOLU-MEDROL) 125 mg/2 mL injection 125 mg (125 mg Intravenous Given 09/04/23 0503)  magnesium sulfate IVPB 2 g 50 mL (0 g Intravenous Stopped 09/04/23 0629)  oxybutynin (DITROPAN) tablet 5 mg (5 mg Oral Given 09/04/23 0535)  morphine (PF) 2 MG/ML injection 2 mg (2 mg Intravenous Given 09/04/23 0535)  ondansetron (ZOFRAN) injection 4 mg (4 mg Intravenous Given 09/04/23 0638)  ipratropium-albuterol (DUONEB) 0.5-2.5 (3) MG/3ML nebulizer solution 3 mL (3 mLs Nebulization Given 09/04/23 0638)  ipratropium-albuterol (DUONEB) 0.5-2.5 (3) MG/3ML nebulizer solution 3 mL (3 mLs Nebulization Given 09/04/23 0644)  cefTRIAXone (ROCEPHIN) 1 g in sodium chloride 0.9 % 100 mL IVPB (0 g Intravenous Stopped 09/04/23 0821)  sucralfate (CARAFATE) tablet 1 g (1 g Oral Given 09/04/23 0754)  furosemide (LASIX) injection 40 mg (40 mg Intravenous Given 09/04/23 0859)  heparin sodium (porcine) 1000 UNIT/ML injection (4,000 Units  Given 09/04/23 1358)  ondansetron (ZOFRAN) 4 MG/2ML injection (4 mg  Given 09/04/23 1437)                                                                                                                                      Procedures .Critical Care  Performed by: Sloan Leiter, DO Authorized by: Sloan Leiter, DO   Critical care provider statement:    Critical care time (minutes):  30   Critical care time was exclusive of:  Separately billable procedures and treating other patients   Critical care was necessary to treat or prevent imminent or life-threatening deterioration of the following conditions:  Respiratory failure   Critical care was time spent personally by me on the following activities:  Development of treatment plan with patient or surrogate, discussions with consultants, evaluation of patient's response to treatment, examination of patient, ordering and review of laboratory studies, ordering and review of radiographic studies, ordering and performing treatments and interventions, pulse oximetry, re-evaluation of patient's condition, review of old charts and obtaining history from patient or surrogate   Care discussed with: admitting provider     (including critical care time)  Medical Decision Making / ED Course    Medical Decision Making:    Naman Spychalski is a 68 y.o. male with past medical history as below, significant for ESRD on HD Tuesday Thursday Saturday, GERD, prediabetes, polysubstance abuse, COPD, tobacco use who presents to the ED with  complaint of dyspnea, cough. The complaint involves an extensive differential diagnosis and also carries with it a high risk of complications and morbidity.  Serious etiology was considered. Ddx includes but is not limited to: In my evaluation of this patient's dyspnea my DDx includes, but is not limited to, pneumonia, pulmonary embolism, pneumothorax, pulmonary edema, metabolic acidosis, asthma, COPD, cardiac cause, anemia, anxiety, etc.    Complete initial physical exam performed, notably the patient was in tachypnea noted, no  hypoxia.    Reviewed and confirmed nursing documentation for past medical history, family history, social history.  Vital signs reviewed.      Clinical Course as of 09/05/23 0351  Fri Sep 04, 2023  0504 Troponin I (High Sensitivity)(!): 92 Similar to prior  [SG]  0538 B Natriuretic Peptide(!): >4,500.0 Similar to prior  [SG]  0640 Ongoing tachypnea, no hypoxia  [SG]  0722 With minimal ambulation pulse ox drops to 86-87%, when sitting in the bed pulse ox 99-100% when at rest. Apply [place] 2L, plan admission  [SG]  0743 Spoke w/ dr Arlean Hopping, will arrange for HD  [SG]    Clinical Course User Index [SG] Sloan Leiter, DO    Brief summary: 68 year old with history as above including COPD continues to smoke cigars and ESRD on HD TTS worsening difficulty breathing, coughing.  Diffuse wheezing noted, conversational dyspnea.  No hypoxia.  Given nebulized breathing treatments, mag sulfate, Solu-Medrol, check screening labs and chest x-ray, reassess.  Labs reviewed as above.  He is wheezing has somewhat improved.  Hypoxia with exertion.  Placed on 2 L nasal cannula.  Plan admission for COPD exacerbation, possible CHF exacerbation, volume overload from missed dialysis. Admit IMTS              Additional history obtained: -Additional history obtained from family -External records from outside source obtained and reviewed including: Chart review including previous notes, labs, imaging, consultation notes including  Home meds Prior labs   Lab Tests: -I ordered, reviewed, and interpreted labs.   The pertinent results include:   Labs Reviewed  BASIC METABOLIC PANEL - Abnormal; Notable for the following components:      Result Value   CO2 21 (*)    Glucose, Bld 129 (*)    BUN 51 (*)    Creatinine, Ser 10.64 (*)    GFR, Estimated 5 (*)    Anion gap 17 (*)    All other components within normal limits  CBC WITH DIFFERENTIAL/PLATELET - Abnormal; Notable for the following components:    RBC 4.20 (*)    Hemoglobin 11.7 (*)    HCT 36.1 (*)    RDW 17.7 (*)    All other components within normal limits  BRAIN NATRIURETIC PEPTIDE - Abnormal; Notable for the following components:   B Natriuretic Peptide >4,500.0 (*)    All other components within normal limits  PHOSPHORUS - Abnormal; Notable for the following components:   Phosphorus 6.8 (*)    All other components within normal limits  I-STAT VENOUS BLOOD GAS, ED - Abnormal; Notable for the following components:   pCO2, Ven 40.2 (*)    Potassium 5.4 (*)    Calcium, Ion 0.95 (*)    HCT 37.0 (*)    Hemoglobin 12.6 (*)    All other components within normal limits  TROPONIN I (HIGH SENSITIVITY) - Abnormal; Notable for the following components:   Troponin I (High Sensitivity) 92 (*)    All other components within normal limits  TROPONIN I (  HIGH SENSITIVITY) - Abnormal; Notable for the following components:   Troponin I (High Sensitivity) 98 (*)    All other components within normal limits  RESP PANEL BY RT-PCR (RSV, FLU A&B, COVID)  [id number]  RESPIRATORY PANEL BY PCR  ALBUMIN  HEPATITIS B SURFACE ANTIGEN  HEPATITIS B SURFACE ANTIBODY, QUANTITATIVE    Notable for as above  EKG   EKG Interpretation Date/Time:  Friday September 04 2023 07:30:57 EST Ventricular Rate:  90 PR Interval:  204 QRS Duration:  115 QT Interval:  415 QTC Calculation: 508 R Axis:   175  Text Interpretation: Sinus rhythm Biatrial enlargement Consider left ventricular hypertrophy Probable lateral infarct, age indeterminate Anterior Q waves, possibly due to LVH Nonspecific T abnormalities, lateral leads Prolonged QT interval similar to prior Confirmed by Tanda Rockers ([id number]) on 09/04/2023 7:43:16 AM         Imaging Studies ordered: I ordered imaging studies including cxr I independently visualized the following imaging with scope of interpretation limited to determining acute life threatening conditions related to emergency care; findings noted  above I independently visualized and interpreted imaging. I agree with the radiologist interpretation   Medicines ordered and prescription drug management: Meds ordered this encounter  Medications   ipratropium-albuterol (DUONEB) 0.5-2.5 (3) MG/3ML nebulizer solution 3 mL   methylPREDNISolone sodium succinate (SOLU-MEDROL) 125 mg/2 mL injection 125 mg    IV methylprednisolone will be converted to either a q12h or q24h frequency with the same total daily dose (TDD).  Ordered Dose: 1 to 125 mg TDD; convert to: TDD q24h.  Ordered Dose: 126 to 250 mg TDD; convert to: TDD div q12h.  Ordered Dose: >250 mg TDD; DAW.   magnesium sulfate IVPB 2 g 50 mL   oxybutynin (DITROPAN) tablet 5 mg   morphine (PF) 2 MG/ML injection 2 mg   ondansetron (ZOFRAN) injection 4 mg   ipratropium-albuterol (DUONEB) 0.5-2.5 (3) MG/3ML nebulizer solution 3 mL   ipratropium-albuterol (DUONEB) 0.5-2.5 (3) MG/3ML nebulizer solution 3 mL   cefTRIAXone (ROCEPHIN) 1 g in sodium chloride 0.9 % 100 mL IVPB    Antibiotic Indication::   Other Indication (list below)    Other Indication::   copd   sucralfate (CARAFATE) tablet 1 g   DISCONTD: heparin injection 5,000 Units   furosemide (LASIX) injection 40 mg   DISCONTD: amLODipine (NORVASC) tablet 10 mg   DISCONTD: apixaban (ELIQUIS) tablet 5 mg   DISCONTD: hydrALAZINE (APRESOLINE) tablet 25 mg   DISCONTD: isosorbide mononitrate (IMDUR) 24 hr tablet 30 mg   DISCONTD: rosuvastatin (CRESTOR) tablet 10 mg   DISCONTD: sevelamer carbonate (RENVELA) tablet 1,600 mg   DISCONTD: tamsulosin (FLOMAX) capsule 0.4 mg   DISCONTD: amoxicillin-clavulanate (AUGMENTIN) 500-125 MG per tablet 1 tablet   DISCONTD: predniSONE (DELTASONE) tablet 40 mg   DISCONTD: ipratropium-albuterol (DUONEB) 0.5-2.5 (3) MG/3ML nebulizer solution 3 mL   DISCONTD: trimethobenzamide (TIGAN) injection 200 mg   DISCONTD: Chlorhexidine Gluconate Cloth 2 % PADS 6 each   DISCONTD: pentafluoroprop-tetrafluoroeth  (GEBAUERS) aerosol 1 Application   DISCONTD: lidocaine (PF) (XYLOCAINE) 1 % injection 5 mL   DISCONTD: lidocaine-prilocaine (EMLA) cream 1 Application   DISCONTD: feeding supplement (NEPRO CARB STEADY) liquid 237 mL   DISCONTD: heparin injection 1,000 Units   DISCONTD: anticoagulant sodium citrate solution 5 mL   DISCONTD: alteplase (CATHFLO ACTIVASE) injection 2 mg   DISCONTD: heparin injection 2,500 Units   DISCONTD: heparin injection 1,500 Units   heparin sodium (porcine) 1000 UNIT/ML injection    Freddi Starr J: cabinet override  DISCONTD: pantoprazole (PROTONIX) EC tablet 40 mg   DISCONTD: ondansetron (ZOFRAN) injection 4 mg   ondansetron (ZOFRAN) 4 MG/2ML injection    Delrae Sawyers A: cabinet override   heparin sodium (porcine) 1000 UNIT/ML injection    Freddi Starr J: cabinet override   DISCONTD: Chlorhexidine Gluconate Cloth 2 % PADS 6 each    -I have reviewed the patients home medicines and have made adjustments as needed   Consultations Obtained: I requested consultation with the nephro,  and discussed lab and imaging findings as well as pertinent plan - they recommend: HD tomorrow   Cardiac Monitoring: The patient was maintained on a cardiac monitor.  I personally viewed and interpreted the cardiac monitored which showed an underlying rhythm of: NSR Continuous pulse oximetry interpreted by myself, 86% on RA, 99% on 2L.    Social Determinants of Health:  Diagnosis or treatment significantly limited by social determinants of health: current smoker Counseled patient for approximately 3 minutes regarding smoking cessation. Discussed risks of smoking and how they applied and affected their visit here today. Patient not ready to quit at this time, however will follow up with their primary doctor when they are.   CPT code: 34742: intermediate counseling for smoking cessation      Reevaluation: After the interventions noted above, I reevaluated the patient and found  that they have improved  Co morbidities that complicate the patient evaluation  Past Medical History:  Diagnosis Date   Anemia    Arthritis    Back pain    Bronchitis    COPD (chronic obstructive pulmonary disease) (HCC)    Coronary artery disease    COVID    mild - flu like symptoms   Dyspnea    w/ exertion, uses inhaler   ESRD on hemodialysis (HCC)    dialysis on tues, thurs, sat at [facility]   GERD (gastroesophageal reflux disease)    not a current problem   Heart murmur    never has caused any problems   Hypertension    Myocardial infarction (HCC)    Pneumonia    x 1   Pre-diabetes    diet controlled, no meds, does not check blood sugar   Substance abuse (HCC)       Dispostion: Disposition decision including need for hospitalization was considered, and patient admitted to the hospital.    Final Clinical Impression(s) / ED Diagnoses Final diagnoses:  COPD with acute exacerbation (HCC)  ESRD on hemodialysis (HCC)  Hypoxia        Sloan Leiter, DO 09/05/23 [phone]

## 2023-09-04 NOTE — ED Triage Notes (Signed)
  Patient comes in with SOB after being diagnosed with pneumonia on 1/21.  Patient has hx of COPD and on dialysis.  Patient tachypneic and labored breathing.  Was getting dialysis on 1/22 but could only do half his treatment.  SPO2 100%, RR 40.

## 2023-09-04 NOTE — ED Notes (Signed)
Due to patient's severely distressed work of breathing, patient's ambulatory O2 not checked. Provider Gwenette Greet advised

## 2023-09-04 NOTE — Procedures (Signed)
I was present at the procedure, reviewed the HD regimen and made appropriate changes.   Vinson Moselle MD  [facility] 09/04/2023, 2:53 PM

## 2023-09-04 NOTE — Consult Note (Signed)
Renal Service Consult Note [facility]  Ronald Reeves 09/04/2023 Ronald Krabbe, MD Requesting Physician: Dr. Ninetta Reeves  Reason for Consult: ESRD pt w/ SOB HPI: The patient is a 68 y.o. year-old w/ PMH as below who presented to ED 1/24 c/o SOB. Was in ED on 1/21 and dx'd with PNA and dc'd w/ abx. Hx COPD and dialysis. Had only 2hrs of his HD this Tuesday and missed HD yesterday due to feeling very unwell. In ED pt placed on 2L [place] O2. VSS except RR 40 on arrival and improved to 25 -30. BP's stable. Na 138. K 4.7  bun 51 creat 10. CXR showed poss vasc congestion, no other changes.   Pt seen in ED. Hx as above. Pt lives w/ his wife. He usually drives himself to HD. She drives sometimes if he is not feeling well.   Recently failed AVF access w/o clear explanation. Pt had TDC placed in R femoral position. Seen by [facility] Dr Ronald Reeves who did a bilat UE venogram on 1/17 and plan is to place a LUE AVG due to small arm veins.  ROS - denies CP, no joint pain, no HA, no blurry vision, no rash, no diarrhea, no nausea/ vomiting, no dysuria, no difficulty voiding   Past Medical History  Past Medical History:  Diagnosis Date   Anemia    Arthritis    Back pain    Bronchitis    COPD (chronic obstructive pulmonary disease) (HCC)    Coronary artery disease    COVID    mild - flu like symptoms   Dyspnea    w/ exertion, uses inhaler   ESRD on hemodialysis (HCC)    dialysis on tues, thurs, sat at [facility]   GERD (gastroesophageal reflux disease)    not a current problem   Heart murmur    never has caused any problems   Hypertension    Myocardial infarction (HCC)    Pneumonia    x 1   Pre-diabetes    diet controlled, no meds, does not check blood sugar   Substance abuse [facility])    Past Surgical History  Past Surgical History:  Procedure Laterality Date   A/V FISTULAGRAM Right 06/17/2023   Procedure: A/V Fistulagram;  Surgeon: Ronald Sparrow, MD;  Location: [facility] INVASIVE CV LAB;  Service:  Cardiovascular;  Laterality: Right;   A/V FISTULAGRAM Right 07/17/2023   Procedure: A/V Fistulagram;  Surgeon: Ronald Douglas, MD;  Location: [facility] INVASIVE CV LAB;  Service: Cardiovascular;  Laterality: Right;   AV FISTULA PLACEMENT Left 04/12/2021   Procedure: LEFT ARM ARTERIOVENOUS (AV) FISTULA CREATION;  Surgeon: Ronald Shelling, MD;  Location: [facility] OR;  Service: Vascular;  Laterality: Left;   AV FISTULA PLACEMENT Left 10/18/2021   Procedure: LEFT ARM BRACHIOBASILIC FISTULA CREATION FIRST STAGE;  Surgeon: Ronald Shelling, MD;  Location: [facility] OR;  Service: Vascular;  Laterality: Left;   AV FISTULA PLACEMENT Right 11/28/2022   Procedure: RIGHT ARM FIRST STAGE BRACHIOBASILIC FISTULA CREATION;  Surgeon: Ronald Shelling, MD;  Location: [facility] OR;  Service: Vascular;  Laterality: Right;   BACK SURGERY     BASCILIC VEIN TRANSPOSITION Left 01/27/2022   Procedure: LEFT SECOND STAGE BASILIC VEIN TRANSPOSITION;  Surgeon: Ronald Shelling, MD;  Location: [facility] OR;  Service: Vascular;  Laterality: Left;   BASCILIC VEIN TRANSPOSITION Right 02/18/2023   Procedure: SECOND STAGE RIGHT ARM BASILIC VEIN TRANSPOSITION;  Surgeon: Ronald Shelling, MD;  Location: [facility] OR;  Service: Vascular;  Laterality:  Right;   BIOPSY  08/20/2022   Procedure: BIOPSY;  Surgeon: Ronald Cleverly, DO;  Location: [facility];  Service: Gastroenterology;;   COLONOSCOPY WITH PROPOFOL N/A 08/20/2022   Procedure: COLONOSCOPY WITH PROPOFOL;  Surgeon: Ronald Cleverly, DO;  Location: [facility];  Service: Gastroenterology;  Laterality: N/A;   DIALYSIS/PERMA CATHETER INSERTION Right 03/05/2023   Procedure: DIALYSIS/PERMA CATHETER INSERTION;  Surgeon: Ronald Shelling, MD;  Location: [facility] INVASIVE CV LAB;  Service: Cardiovascular;  Laterality: Right;   ESOPHAGOGASTRODUODENOSCOPY (EGD) WITH PROPOFOL N/A 08/20/2022   Procedure: ESOPHAGOGASTRODUODENOSCOPY (EGD) WITH PROPOFOL;  Surgeon: Ronald Cleverly, DO;  Location: [facility];   Service: Gastroenterology;  Laterality: N/A;   INSERTION OF DIALYSIS CATHETER Right 05/01/2022   Procedure: INSERTION OF DIALYSIS CATHETER;  Surgeon: Ronald Douglas, MD;  Location: [facility] OR;  Service: Vascular;  Laterality: Right;   INSERTION OF DIALYSIS CATHETER Right 07/10/2023   Procedure: ULTRASOUNDED GUIDED INSERTION OF TUNNELED  DIALYSIS CATHETER TO RIGHT FEMORAL VEIN;  Surgeon: Ronald Shelling, MD;  Location: [facility] OR;  Service: Vascular;  Laterality: Right;   IR REMOVAL TUN CV CATH W/O FL  12/19/2022   LIGATION OF COMPETING BRANCHES OF ARTERIOVENOUS FISTULA Left 07/15/2021   Procedure: LIGATION OF COMPETING BRANCHES OF LEFT RADIOCEPHALIC ARTERIOVENOUS FISTULA TIMES TWO;  Surgeon: Ronald Shelling, MD;  Location: [facility] OR;  Service: Vascular;  Laterality: Left;   PERIPHERAL VASCULAR BALLOON ANGIOPLASTY  07/17/2023   Procedure: PERIPHERAL VASCULAR BALLOON ANGIOPLASTY;  Surgeon: Ronald Douglas, MD;  Location: [facility] INVASIVE CV LAB;  Service: Cardiovascular;;   PERIPHERAL VASCULAR INTERVENTION  06/17/2023   Procedure: PERIPHERAL VASCULAR INTERVENTION;  Surgeon: Ronald Sparrow, MD;  Location[facility] INVASIVE CV LAB;  Service: Cardiovascular;;   POLYPECTOMY  08/20/2022   Procedure: POLYPECTOMY;  Surgeon: Ronald Cleverly, DO;  Location: [facility];  Service: Gastroenterology;;   REVISON OF ARTERIOVENOUS FISTULA Left 07/15/2021   Procedure: REVISON OF LEFT ARTERIOVENOUS FISTULA;  Surgeon: Ronald Shelling, MD;  Location: [facility] OR;  Service: Vascular;  Laterality: Left;   REVISON OF ARTERIOVENOUS FISTULA Left 05/01/2022   Procedure: ARTERIOVENOUS FISTULA WASHOUT OF ARM HEMATOMA;  Surgeon: Ronald Douglas, MD;  Location: [facility] OR;  Service: Vascular;  Laterality: Left;   UPPER EXTREMITY VENOGRAPHY Bilateral 08/28/2023   Procedure: UPPER EXTREMITY VENOGRAPHY;  Surgeon: Ronald Douglas, MD;  Location: [facility] INVASIVE CV LAB;  Service: Cardiovascular;  Laterality: Bilateral;   Family History  Family History   Problem Relation Age of Onset   Heart disease Mother    Heart attack Sister 26   Social History  reports that he has been smoking cigars and cigarettes. He has been exposed to tobacco smoke. He has never used smokeless tobacco. He reports current alcohol use of about 14.0 standard drinks of alcohol per week. He reports current drug use. Frequency: 1.00 time per week. Drugs: Marijuana and Cocaine. Allergies  Allergies  Allergen Reactions   Gabapentin     Encephalopathy, tremor   Iodinated Contrast Media Nausea And Vomiting   Home medications Prior to Admission medications   Medication Sig Start Date End Date Taking? Authorizing Provider  apixaban (ELIQUIS) 5 MG TABS tablet Take 1 tablet (5 mg total) by mouth 2 (two) times daily. 03/06/23  Yes Atway, Rayann N, DO  doxycycline (VIBRA-TABS) 100 MG tablet Take 100 mg by mouth 2 (two) times daily. Take one tablet by mouth twice a day. Avoid taking Same time as Vitamins/minerals. Take antibiotic at least two hours before or two  hours after using iron. 09/01/23  Yes [provider]  furosemide (LASIX) 40 MG tablet Take 40 mg by mouth daily.   Yes [provider]  hydrALAZINE (APRESOLINE) 25 MG tablet Take 25 mg by mouth 2 (two) times daily.   Yes [provider]  isosorbide mononitrate (IMDUR) 30 MG 24 hr tablet Take 30 mg by mouth daily.   Yes [provider]  omeprazole (PRILOSEC) 40 MG capsule Take 1 capsule (40 mg total) by mouth daily. 06/06/22  Yes Arnaldo Natal, NP  PSYLLIUM PO Take by mouth. 09/03/23  Yes [provider]  rosuvastatin (CRESTOR) 10 MG tablet Take 10 mg by mouth daily.   Yes [provider]  sevelamer carbonate (RENVELA) 800 MG tablet Take 1,600 mg by mouth 3 (three) times daily with meals. 01/06/23  Yes [provider]  tamsulosin (FLOMAX) 0.4 MG CAPS capsule Take 0.4 mg by mouth at bedtime.   Yes [provider]  Tiotropium Bromide-Olodaterol  2.5-2.5 MCG/ACT AERS Take 2 puffs by mouth in the morning. 08/07/23  Yes [provider]  albuterol (VENTOLIN HFA) 108 (90 Base) MCG/ACT inhaler Inhale 2 puffs into the lungs every 6 (six) hours as needed (COPD).    [provider]  amLODipine (NORVASC) 10 MG tablet Take 1 tablet (10 mg total) by mouth daily. 01/01/18   Lewayne Bunting, MD  amoxicillin-clavulanate (AUGMENTIN) 250-125 MG tablet Take 1 tablet by mouth 2 (two) times daily. 09/01/23   [provider]  B Complex-C-Folic Acid (DIALYVITE TABLET) TABS Take 1 tablet by mouth daily. 12/23/22   [provider]  naloxone [facility]) nasal spray 4 mg/0.1 mL Place 0.4 mg into the nose See admin instructions.  SPRAY 1 SPRAY INTO ONE NOSTRIL AS DIRECTED AS NEEDED FOR RESCUE FOR OPIOID OVERDOSE - CALL 911 IMMEDIATELY, ADMINISTER DOSE, THEN TURN PERSON ON SIDE - IF NO RESPONSE IN 2-3 MINUTES OR PERSON RESPONDS BUT RELAPSES, REPEAT USING A NEW SPRAY DEVICE AND SPRAY INTO THE OTHER NOSTRIL AS NEEDED FOR RESCUE 01/21/23   [provider]  oxyCODONE (ROXICODONE) 5 MG immediate release tablet Take 1 tablet (5 mg total) by mouth every 6 (six) hours as needed for severe pain (pain score 7-10). Patient not taking: Reported on 08/26/2023 07/10/23   Baglia, Corrina, PA-C  sorbitol 70 % solution Take 10 mLs by mouth 3 (three) times daily as needed (constipation). Patient not taking: Reported on 08/26/2023 01/20/23   [provider]     Vitals:   09/04/23 0715 09/04/23 0727 09/04/23 0830 09/04/23 0900  BP: (!) 159/82  (!) 156/87 (!) 163/78  Pulse: 91  90 91  Resp: (!) 30  18 16   Temp:  97.8 F (36.6 C)    TempSrc:  Oral    SpO2: 100%  100% 100%  Weight:      Height:       Exam Gen alert, no distress, [place] O2, a bit restless No rash, cyanosis or gangrene Sclera anicteric, throat clear  No jvd or bruits Chest mild bilat exp wheezing, occ basilar crackle RRR no RG Abd soft ntnd no mass or ascites +bs GU nl  male MS no joint effusions or deformity Ext no LE or UE edema, no other edema Neuro is alert, Ox 3 , nf    R fem TDC intact     Renal-related home meds: - lasix 40 every day - hydralazine 25 bid - renvela 2 ac tid - amlodipine 10 every day - dialyvite  daily    OP HD: [facility] TTS 4h  B400   64.8kg   2/2 bath  R fem TDC    Heparin 2500 - last HD 1/21 (2hrs), post wt 67.6 (+2.8)   - rocaltrol 2.0 mcg three times per week - no esa, last hb 11.6  Assessment/ Plan: SOB - w/ vasc congestion by CXR. Had 1/2 HD Tuesday and missed HD yesterday. BP's up. Suspect vol overload related to missed HD. Plan is for HD today.  ESRD - on HD TTS. HD as above.  HTN - BP's are stable to a bit high here.  Volume - as above. Lower vol w/ HD as tolerated.  Anemia of esrd - Hb 11-13 here, no esa needs.  Secondary hyperparathyroidism - Ca in range, add on alb/phos. Cont binders w/ meals.  COPD        Vinson Moselle  MD [facility] 09/04/2023, 11:35 AM  Recent Labs  Lab 08/30/23 1703 09/04/23 0339 09/04/23 1014  HGB 13.6 11.7* 12.6*  CALCIUM  --  9.1  --   CREATININE 10.60* 10.64*  --   K 4.9 4.7 5.4*   Inpatient medications:  amLODipine  10 mg Oral Daily   amoxicillin-clavulanate  1 tablet Oral Daily   apixaban  5 mg Oral BID   hydrALAZINE  25 mg Oral BID   ipratropium-albuterol  3 mL Nebulization Q4H   isosorbide mononitrate  30 mg Oral Daily   [START ON 09/05/2023] predniSONE  40 mg Oral Q breakfast   rosuvastatin  10 mg Oral Daily   sevelamer carbonate  1,600 mg Oral TID WC   tamsulosin  0.4 mg Oral QHS   trimethobenzamide  200 mg Intramuscular Once    sodium chloride flush, sodium chloride flush, sodium chloride flush

## 2023-09-05 LAB — HEPATITIS B SURFACE ANTIBODY, QUANTITATIVE: Hep B S AB Quant (Post): 14.9 m[IU]/mL

## 2023-09-07 ENCOUNTER — Encounter: Payer: Self-pay | Admitting: Infectious Diseases

## 2023-09-07 DIAGNOSIS — Z789 Other specified health status: Secondary | ICD-10-CM | POA: Insufficient documentation

## 2023-09-07 NOTE — Progress Notes (Signed)
Late Note entry Sep 07, 2023  Contacted [facility] this morning to be advised that pt left hospital AMA on Friday evening. It is unknown if pt attended treatment on Saturday at clinic.   Olivia Canter Renal Navigator [phone]

## 2023-09-11 ENCOUNTER — Institutional Professional Consult (permissible substitution): Payer: No Typology Code available for payment source | Admitting: Pulmonary Disease

## 2023-09-30 ENCOUNTER — Other Ambulatory Visit (HOSPITAL_COMMUNITY): Payer: Self-pay

## 2023-10-06 ENCOUNTER — Telehealth (HOSPITAL_COMMUNITY): Payer: Self-pay | Admitting: *Deleted

## 2023-10-06 NOTE — Telephone Encounter (Signed)
 Received fax from Dr Crista Elliot requesting follow up for permanent access. Venogram 08/28/2023 [facility] with plans for L UE AVG.  Will give to [facility] and scan fax into media.

## 2023-10-07 ENCOUNTER — Telehealth: Payer: Self-pay

## 2023-10-07 NOTE — Telephone Encounter (Signed)
 Attempted to call for surgery scheduling. LVM

## 2023-10-09 ENCOUNTER — Other Ambulatory Visit: Payer: Self-pay

## 2023-10-09 DIAGNOSIS — N186 End stage renal disease: Secondary | ICD-10-CM

## 2023-10-16 ENCOUNTER — Encounter (HOSPITAL_COMMUNITY): Payer: Self-pay | Admitting: Vascular Surgery

## 2023-10-16 ENCOUNTER — Other Ambulatory Visit: Payer: Self-pay

## 2023-10-16 NOTE — Progress Notes (Signed)
 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    Recently admitted to [facility] in January 2025 for acute hypoxic respiratory failure secondary to combination of COPD exacerbation, fluid overload, missed dialysis.  He did leave AMA on 09/04/2023.  Per discharge summary, "He initially required 2 L through nasal cannula for dropping O2 saturations but was able to get back on room air pretty quickly. He was treated with IV Lasix, steroids, Augmentin, scheduled daily inhalers and as needed nebulized breathing treatments. Our nephrology team was also consulted and the patient received HD with 2.9 L ultrafiltration. After HD the patient was reportedly agitated and demanded to leave to the nurses on the floor. Prior to primary team being able to respond to the nurses page the patient left AMA."  Echo done during admission showed severely decreased ejection fraction of 20 to 25%, grade 3 diastolic dysfunction, moderately reduced right ventricular systolic function, severely elevated pulmonary artery pressure, mild to moderate mitral regurgitation and mild tricuspid/aortic valve regurgitation.   Patient subsequently followed up with his cardiologist Dr. Fransisco Beau at [facility] on 10/06/2023.  Per note, compensated from CHF standpoint, continued on Coreg, hydralazine, Imdur.  Previously unable to tolerate ACE inhibitor/ARB and not a candidate for SGLT2 inhibitor or Aldactone.  He was noted not to be a current candidate for ICD given  ongoing polysubstance abuse and medication/dialysis noncompliance.  However, he was recommended to have a pharmacologic nuclear stress test in case he does become a candidate in the future.  Patient has not had this testing done yet.  History discussed with anesthesiologist Dr. Aleene Davidson.  He advised that patient can proceed as planned if well compensated and asymptomatic from CV standpoint and day of surgery.  Vascular surgery instructed patient to hold Eliquis 3 days prior to procedure.  Patient will need day of surgery labs and evaluation.  EKG 09/04/2023: Sinus rhythm.  Rate 90. Biatrial enlargement. Consider left ventricular hypertrophy. Probable lateral infarct, age indeterminate. Anterior Q waves, possibly due to LVH. Nonspecific T abnormalities, lateral leads. Prolonged QT interval (QTcB 508).  Echocardiogram 09/04/2023: 1. Left ventricular ejection fraction, by estimation, is 20 to 25%. The left ventricle has severely decreased function. The left ventricle demonstrates global hypokinesis. Left ventricular diastolic parameters are consistent with Grade III diastolic  dysfunction (restrictive).  2. Systolic function is reduced at the apex and spared at the base. Right ventricular systolic function is moderately reduced. The right ventricular size is normal. There is severely elevated pulmonary artery systolic pressure.  3. Left atrial size was severely dilated.  4. Right atrial size was severely dilated.  5. The mitral valve is normal in structure. Mild to moderate mitral valve regurgitation. Mild mitral stenosis. The mean mitral valve gradient is 4.0 mmHg.  6. Tricuspid valve regurgitation is moderate.  7. The aortic valve is tricuspid. Aortic valve regurgitation is mild. No aortic stenosis is present.  8. The inferior vena cava is dilated in size with <50% respiratory variability, suggesting right atrial pressure of 15 mmHg.   Nuclear stress test 08/21/2017: IMPRESSION: 1. Large area  of scarring in the inferior wall  consistent with previous myocardial infarction. No definite reversible components to suggest pharmacologically induced myocardial ischemia.   2. Septal and inferior wall hypokinesis.   3. Left ventricular ejection fraction 38%   4. Non invasive risk stratification*: High     [facility] Short Stay Center/Anesthesiology Phone [phone] 10/16/2023 11:59 AM

## 2023-10-16 NOTE — Anesthesia Preprocedure Evaluation (Addendum)
 Anesthesia Evaluation  Patient identified by MRN, date of birth, ID band Patient awake    Reviewed: Allergy & Precautions, NPO status , Patient's Chart, lab work & pertinent test results  History of Anesthesia Complications Negative for: history of anesthetic complications  Airway Mallampati: III  TM Distance: >3 FB Neck ROM: Full    Dental  (+) Dental Advisory Given, Missing   Pulmonary COPD,  COPD inhaler, Current Smoker and Patient abstained from smoking.    + decreased breath sounds+ wheezing      Cardiovascular hypertension, Pt. on medications pulmonary hypertension+ CAD, + Past MI, +CHF and + DVT  Normal cardiovascular exam+ Valvular Problems/Murmurs MR    '25 TTE - EF 20 to 25%. Global hypokinesis. Left ventricular diastolic parameters are  consistent with Grade III diastolic dysfunction (restrictive). Systolic function is reduced at the apex and spared at the base. Right ventricular systolic function is moderately reduced. There is severely elevated pulmonary artery systolic pressure. Left atrial size was severely dilated. Right atrial size was severely dilated. Mild to moderate mitral valve regurgitation. Mild mitral stenosis. The mean mitral valve gradient is 4.0 mmHg. Tricuspid valve regurgitation is moderate. Aortic valve regurgitation is mild.     Neuro/Psych  PSYCHIATRIC DISORDERS Anxiety Depression    negative neurological ROS     GI/Hepatic ,GERD  Medicated,,(+)     substance abuse  cocaine use and marijuana use  Endo/Other   Pre-DM   Renal/GU ESRF and DialysisRenal disease     Musculoskeletal  (+) Arthritis ,    Abdominal   Peds  Hematology  On eliquis    Anesthesia Other Findings Noncompliance   Reproductive/Obstetrics                             Anesthesia Physical Anesthesia Plan  ASA: 4  Anesthesia Plan: Regional   Post-op Pain Management: Regional block* and  Tylenol PO (pre-op)*   Induction:   PONV Risk Score and Plan: 1 and Propofol infusion and Treatment may vary due to age or medical condition  Airway Management Planned: Natural Airway and Simple Face Mask  Additional Equipment: None  Intra-op Plan:   Post-operative Plan:   Informed Consent: I have reviewed the patients History and Physical, chart, labs and discussed the procedure including the risks, benefits and alternatives for the proposed anesthesia with the patient or authorized representative who has indicated his/her understanding and acceptance.       Plan Discussed with: CRNA, Anesthesiologist and Surgeon  Anesthesia Plan Comments: (See PAT note by Antionette Poles, PA-C)        Anesthesia Quick Evaluation

## 2023-10-16 NOTE — Progress Notes (Signed)
 PCP - Benjiman Core, MD Cardiologist - Alda Berthold, MD  Chest x-ray - 09/04/23 EKG - 09/04/23 Stress Test - 08/21/17 ECHO - 09/04/23  Blood Thinner Instructions: "HOLD YOUR ELIQUIS FOR 3 DAYS BEFORE YOUR PROCEDURE. TAKE YOUR LAST DOSE ON 10/15/23." Last dose was 3/6  Pt states he hasn't taken cocaine in the past six months.   Anesthesia review: Y  Patient verbally denies any shortness of breath, fever, cough and chest pain during phone call   -------------  SDW INSTRUCTIONS given:  Your procedure is scheduled on March 10th.  Report to [facility] Main Entrance "A" at 0530 A.M., and check in at the Admitting office.  Call this number if you have problems the morning of surgery:  [phone]   Remember:  Do not eat or drink after midnight the night before your surgery    Take these medicines the morning of surgery with A SIP OF WATER  amLODipine (NORVASC)  hydrALAZINE (APRESOLINE)  isosorbide mononitrate (IMDUR)  Tiotropium Bromide-Olodaterol (Stiolto Respimat) albuterol (VENTOLIN HFA)-if needed (Please bring with you on the day of surgery)  As of today, STOP taking any Aspirin (unless otherwise instructed by your surgeon) Aleve, Naproxen, Ibuprofen, Motrin, Advil, Goody's, BC's, all herbal medications, fish oil, and all vitamins.                      Do not wear jewelry, make up, or nail polish            Do not wear lotions, powders, perfumes/colognes, or deodorant.            Do not shave 48 hours prior to surgery.  Men may shave face and neck.            Do not bring valuables to the hospital.            [facility] is not responsible for any belongings or valuables.  Do NOT Smoke (Tobacco/Vaping) 24 hours prior to your procedure If you use a CPAP at night, you may bring all equipment for your overnight stay.   Contacts, glasses, dentures or bridgework may not be worn into surgery.      For patients admitted to the hospital, discharge time will be  determined by your treatment team.   Patients discharged the day of surgery will not be allowed to drive home, and someone needs to stay with them for 24 hours.    Special instructions:   [place]- Preparing For Surgery  Before surgery, you can play an important role. Because skin is not sterile, your skin needs to be as free of germs as possible. You can reduce the number of germs on your skin by washing with CHG (chlorahexidine gluconate) Soap before surgery.  CHG is an antiseptic cleaner which kills germs and bonds with the skin to continue killing germs even after washing.    Oral Hygiene is also important to reduce your risk of infection.  Remember - BRUSH YOUR TEETH THE MORNING OF SURGERY WITH YOUR REGULAR TOOTHPASTE  Please do not use if you have an allergy to CHG or antibacterial soaps. If your skin becomes reddened/irritated stop using the CHG.  Do not shave (including legs and underarms) for at least 48 hours prior to first CHG shower. It is OK to shave your face.  Please follow these instructions carefully.   Shower the NIGHT BEFORE SURGERY and the MORNING OF SURGERY with DIAL Soap.   Pat yourself dry with a CLEAN  TOWEL.  Wear CLEAN PAJAMAS to bed the night before surgery  Place CLEAN SHEETS on your bed the night of your first shower and DO NOT SLEEP WITH PETS.   Day of Surgery: Please shower morning of surgery  Wear Clean/Comfortable clothing the morning of surgery Do not apply any deodorants/lotions.   Remember to brush your teeth WITH YOUR REGULAR TOOTHPASTE.   Questions were answered. Patient verbalized understanding of instructions.

## 2023-10-19 ENCOUNTER — Ambulatory Visit (HOSPITAL_COMMUNITY): Payer: Self-pay | Admitting: Physician Assistant

## 2023-10-19 ENCOUNTER — Encounter (HOSPITAL_COMMUNITY)
Admission: RE | Discharge status: Against Medical Advice | Payer: Self-pay | Source: Home / Self Care | Attending: Vascular Surgery

## 2023-10-19 ENCOUNTER — Observation Stay (HOSPITAL_COMMUNITY)
Admission: RE | Admit: 2023-10-19 | Discharge: 2023-10-19 | Discharge status: Against Medical Advice | Payer: No Typology Code available for payment source | Attending: Vascular Surgery | Admitting: Vascular Surgery

## 2023-10-19 ENCOUNTER — Other Ambulatory Visit: Payer: Self-pay

## 2023-10-19 DIAGNOSIS — Z992 Dependence on renal dialysis: Secondary | ICD-10-CM

## 2023-10-19 DIAGNOSIS — I120 Hypertensive chronic kidney disease with stage 5 chronic kidney disease or end stage renal disease: Secondary | ICD-10-CM | POA: Insufficient documentation

## 2023-10-19 DIAGNOSIS — F17210 Nicotine dependence, cigarettes, uncomplicated: Secondary | ICD-10-CM | POA: Insufficient documentation

## 2023-10-19 DIAGNOSIS — N186 End stage renal disease: Secondary | ICD-10-CM

## 2023-10-19 DIAGNOSIS — J449 Chronic obstructive pulmonary disease, unspecified: Secondary | ICD-10-CM | POA: Insufficient documentation

## 2023-10-19 DIAGNOSIS — I132 Hypertensive heart and chronic kidney disease with heart failure and with stage 5 chronic kidney disease, or end stage renal disease: Secondary | ICD-10-CM

## 2023-10-19 DIAGNOSIS — I5040 Unspecified combined systolic (congestive) and diastolic (congestive) heart failure: Secondary | ICD-10-CM | POA: Diagnosis not present

## 2023-10-19 DIAGNOSIS — Z8616 Personal history of COVID-19: Secondary | ICD-10-CM | POA: Diagnosis not present

## 2023-10-19 DIAGNOSIS — I2510 Atherosclerotic heart disease of native coronary artery without angina pectoris: Secondary | ICD-10-CM | POA: Insufficient documentation

## 2023-10-19 DIAGNOSIS — N185 Chronic kidney disease, stage 5: Secondary | ICD-10-CM

## 2023-10-19 HISTORY — DX: Anxiety disorder, unspecified: F41.9

## 2023-10-19 HISTORY — PX: AV FISTULA PLACEMENT: SHX1204

## 2023-10-19 LAB — POCT I-STAT, CHEM 8
BUN: 56 mg/dL — ABNORMAL HIGH (ref 8–23)
Calcium, Ion: 1.04 mmol/L — ABNORMAL LOW (ref 1.15–1.40)
Chloride: 102 mmol/L (ref 98–111)
Creatinine, Ser: 10.1 mg/dL — ABNORMAL HIGH (ref 0.61–1.24)
Glucose, Bld: 110 mg/dL — ABNORMAL HIGH (ref 70–99)
HCT: 43 % (ref 39.0–52.0)
Hemoglobin: 14.6 g/dL (ref 13.0–17.0)
Potassium: 5.5 mmol/L — ABNORMAL HIGH (ref 3.5–5.1)
Sodium: 135 mmol/L (ref 135–145)
TCO2: 27 mmol/L (ref 22–32)

## 2023-10-19 SURGERY — ARTERIOVENOUS (AV) FISTULA CREATION
Anesthesia: Regional | Site: Arm Upper | Laterality: Right

## 2023-10-19 MED ORDER — ACETAMINOPHEN 325 MG PO TABS: 325.0000 mg | ORAL_TABLET | ORAL | Status: DC | PRN

## 2023-10-19 MED ORDER — CHLORHEXIDINE GLUCONATE 0.12 % MT SOLN
15.0000 mL | Freq: Once | OROMUCOSAL | Status: AC
  Administered 2023-10-19: 15 mL via OROMUCOSAL
  Filled 2023-10-19: qty 15

## 2023-10-19 MED ORDER — PHENYLEPHRINE HCL-NACL 20-0.9 MG/250ML-% IV SOLN
INTRAVENOUS | Status: DC | PRN
  Administered 2023-10-19: 30 ug/min via INTRAVENOUS

## 2023-10-19 MED ORDER — ACETAMINOPHEN 325 MG RE SUPP: 325.0000 mg | RECTAL | Status: DC | PRN

## 2023-10-19 MED ORDER — MIRTAZAPINE 30 MG PO TABS
30.0000 mg | ORAL_TABLET | Freq: Every day | ORAL | Status: DC
  Filled 2023-10-19: qty 1

## 2023-10-19 MED ORDER — SODIUM CHLORIDE 0.9% FLUSH: 3.0000 mL | Freq: Two times a day (BID) | INTRAVENOUS | Status: DC

## 2023-10-19 MED ORDER — FENTANYL CITRATE (PF) 100 MCG/2ML IJ SOLN: 25.0000 ug | INTRAMUSCULAR | Status: DC | PRN

## 2023-10-19 MED ORDER — PROPOFOL 500 MG/50ML IV EMUL
INTRAVENOUS | Status: DC | PRN
  Administered 2023-10-19: 25 ug/kg/min via INTRAVENOUS

## 2023-10-19 MED ORDER — LABETALOL HCL 5 MG/ML IV SOLN: 10.0000 mg | INTRAVENOUS | Status: DC | PRN

## 2023-10-19 MED ORDER — TAMSULOSIN HCL 0.4 MG PO CAPS: 0.4000 mg | ORAL_CAPSULE | Freq: Every day | ORAL | Status: DC

## 2023-10-19 MED ORDER — SODIUM CHLORIDE 0.9% FLUSH: 3.0000 mL | INTRAVENOUS | Status: DC | PRN

## 2023-10-19 MED ORDER — LIDOCAINE-EPINEPHRINE (PF) 1 %-1:200000 IJ SOLN
INTRAMUSCULAR | Status: AC
  Filled 2023-10-19: qty 30

## 2023-10-19 MED ORDER — ISOSORBIDE MONONITRATE ER 30 MG PO TB24
30.0000 mg | ORAL_TABLET | Freq: Every day | ORAL | Status: DC
  Administered 2023-10-19: 30 mg via ORAL
  Filled 2023-10-19: qty 1

## 2023-10-19 MED ORDER — PANTOPRAZOLE SODIUM 40 MG PO TBEC
40.0000 mg | DELAYED_RELEASE_TABLET | Freq: Every day | ORAL | Status: DC
  Administered 2023-10-19: 40 mg via ORAL
  Filled 2023-10-19: qty 1

## 2023-10-19 MED ORDER — SODIUM CHLORIDE 0.9 % IV SOLN: INTRAVENOUS | Status: DC | PRN

## 2023-10-19 MED ORDER — ORAL CARE MOUTH RINSE: 15.0000 mL | Freq: Once | OROMUCOSAL | Status: AC

## 2023-10-19 MED ORDER — OXYCODONE HCL 5 MG/5ML PO SOLN: 5.0000 mg | Freq: Once | ORAL | Status: DC | PRN

## 2023-10-19 MED ORDER — HYDRALAZINE HCL 25 MG PO TABS
25.0000 mg | ORAL_TABLET | Freq: Two times a day (BID) | ORAL | Status: DC
  Administered 2023-10-19: 25 mg via ORAL
  Filled 2023-10-19: qty 1

## 2023-10-19 MED ORDER — FENTANYL CITRATE (PF) 250 MCG/5ML IJ SOLN
INTRAMUSCULAR | Status: AC
  Filled 2023-10-19: qty 5

## 2023-10-19 MED ORDER — HEPARIN SODIUM (PORCINE) 1000 UNIT/ML IJ SOLN
INTRAMUSCULAR | Status: DC | PRN
  Administered 2023-10-19: 5000 [IU] via INTRAVENOUS

## 2023-10-19 MED ORDER — MIDAZOLAM HCL 2 MG/2ML IJ SOLN
INTRAMUSCULAR | Status: DC | PRN
  Administered 2023-10-19: 1 mg via INTRAVENOUS

## 2023-10-19 MED ORDER — OXYCODONE-ACETAMINOPHEN 5-325 MG PO TABS
1.0000 | ORAL_TABLET | ORAL | Status: DC | PRN
  Administered 2023-10-19: 2 via ORAL
  Filled 2023-10-19: qty 2

## 2023-10-19 MED ORDER — CHLORHEXIDINE GLUCONATE CLOTH 2 % EX PADS: 6.0000 | MEDICATED_PAD | Freq: Every day | CUTANEOUS | Status: DC

## 2023-10-19 MED ORDER — CHLORHEXIDINE GLUCONATE 4 % EX SOLN: 60.0000 mL | Freq: Once | CUTANEOUS | Status: DC

## 2023-10-19 MED ORDER — HEPARIN 6000 UNIT IRRIGATION SOLUTION
Status: DC | PRN
  Administered 2023-10-19: 1

## 2023-10-19 MED ORDER — HEPARIN 6000 UNIT IRRIGATION SOLUTION
Status: AC
  Filled 2023-10-19: qty 500

## 2023-10-19 MED ORDER — ESCITALOPRAM OXALATE 20 MG PO TABS
20.0000 mg | ORAL_TABLET | Freq: Every day | ORAL | Status: DC
  Filled 2023-10-19: qty 1

## 2023-10-19 MED ORDER — ACETAMINOPHEN 500 MG PO TABS
1000.0000 mg | ORAL_TABLET | Freq: Once | ORAL | Status: AC
  Administered 2023-10-19: 1000 mg via ORAL
  Filled 2023-10-19: qty 2

## 2023-10-19 MED ORDER — ONDANSETRON HCL 4 MG/2ML IJ SOLN: 4.0000 mg | Freq: Once | INTRAMUSCULAR | Status: DC | PRN

## 2023-10-19 MED ORDER — METOPROLOL TARTRATE 5 MG/5ML IV SOLN: 2.0000 mg | INTRAVENOUS | Status: DC | PRN

## 2023-10-19 MED ORDER — MIDAZOLAM HCL 2 MG/2ML IJ SOLN
INTRAMUSCULAR | Status: AC
  Filled 2023-10-19: qty 2

## 2023-10-19 MED ORDER — CEFAZOLIN SODIUM-DEXTROSE 2-4 GM/100ML-% IV SOLN
2.0000 g | INTRAVENOUS | Status: AC
  Administered 2023-10-19: 2 g via INTRAVENOUS
  Filled 2023-10-19: qty 100

## 2023-10-19 MED ORDER — ROSUVASTATIN CALCIUM 5 MG PO TABS
10.0000 mg | ORAL_TABLET | Freq: Every day | ORAL | Status: DC
  Administered 2023-10-19: 10 mg via ORAL
  Filled 2023-10-19: qty 2

## 2023-10-19 MED ORDER — OXYCODONE HCL 5 MG PO TABS: 5.0000 mg | ORAL_TABLET | Freq: Once | ORAL | Status: DC | PRN

## 2023-10-19 MED ORDER — PROPOFOL 1000 MG/100ML IV EMUL
INTRAVENOUS | Status: AC
  Filled 2023-10-19: qty 300

## 2023-10-19 MED ORDER — AMLODIPINE BESYLATE 10 MG PO TABS
10.0000 mg | ORAL_TABLET | Freq: Every day | ORAL | Status: DC
  Administered 2023-10-19: 10 mg via ORAL
  Filled 2023-10-19: qty 1

## 2023-10-19 MED ORDER — ALBUTEROL SULFATE (2.5 MG/3ML) 0.083% IN NEBU: 3.0000 mL | INHALATION_SOLUTION | Freq: Four times a day (QID) | RESPIRATORY_TRACT | Status: DC | PRN

## 2023-10-19 MED ORDER — HYDRALAZINE HCL 20 MG/ML IJ SOLN: 5.0000 mg | INTRAMUSCULAR | Status: DC | PRN

## 2023-10-19 MED ORDER — 0.9 % SODIUM CHLORIDE (POUR BTL) OPTIME
TOPICAL | Status: DC | PRN
  Administered 2023-10-19: 1000 mL

## 2023-10-19 MED ORDER — ONDANSETRON HCL 4 MG/2ML IJ SOLN: 4.0000 mg | Freq: Four times a day (QID) | INTRAMUSCULAR | Status: DC | PRN

## 2023-10-19 MED ORDER — MEPIVACAINE HCL (PF) 1.5 % IJ SOLN
INTRAMUSCULAR | Status: DC | PRN
  Administered 2023-10-19: 20 mL via PERINEURAL

## 2023-10-19 MED ORDER — FENTANYL CITRATE (PF) 250 MCG/5ML IJ SOLN
INTRAMUSCULAR | Status: DC | PRN
  Administered 2023-10-19: 50 ug via INTRAVENOUS

## 2023-10-19 SURGICAL SUPPLY — 34 items
ARMBAND PINK RESTRICT EXTREMIT (MISCELLANEOUS) ×1 IMPLANT
BENZOIN TINCTURE PRP APPL 2/3 (GAUZE/BANDAGES/DRESSINGS) ×1 IMPLANT
CANISTER SUCT 3000ML PPV (MISCELLANEOUS) ×1 IMPLANT
CANNULA VESSEL 3MM 2 BLNT TIP (CANNULA) ×1 IMPLANT
CHLORAPREP W/TINT 26 (MISCELLANEOUS) ×1 IMPLANT
CLIP LIGATING EXTRA MED SLVR (CLIP) ×1 IMPLANT
CLIP LIGATING EXTRA SM BLUE (MISCELLANEOUS) ×1 IMPLANT
COVER PROBE W GEL 5X96 (DRAPES) IMPLANT
DRSG TEGADERM 2-3/8X2-3/4 SM (GAUZE/BANDAGES/DRESSINGS) IMPLANT
ELECT REM PT RETURN 9FT ADLT (ELECTROSURGICAL) ×1 IMPLANT
ELECTRODE REM PT RTRN 9FT ADLT (ELECTROSURGICAL) ×1 IMPLANT
GAUZE SPONGE 2X2 STRL 8-PLY (GAUZE/BANDAGES/DRESSINGS) IMPLANT
GLOVE BIO SURGEON STRL SZ8 (GLOVE) ×1 IMPLANT
GOWN STRL REUS W/ TWL LRG LVL3 (GOWN DISPOSABLE) ×2 IMPLANT
GOWN STRL REUS W/ TWL XL LVL3 (GOWN DISPOSABLE) ×1 IMPLANT
GRAFT GORETEX STRT 4-7X45 (Vascular Products) IMPLANT
INSERT FOGARTY SM (MISCELLANEOUS) IMPLANT
KIT BASIN OR (CUSTOM PROCEDURE TRAY) ×1 IMPLANT
KIT TURNOVER KIT B (KITS) ×1 IMPLANT
NDL 18GX1X1/2 (RX/OR ONLY) (NEEDLE) IMPLANT
NEEDLE 18GX1X1/2 (RX/OR ONLY) (NEEDLE) IMPLANT
NS IRRIG 1000ML POUR BTL (IV SOLUTION) ×1 IMPLANT
PACK CV ACCESS (CUSTOM PROCEDURE TRAY) ×1 IMPLANT
PAD ARMBOARD 7.5X6 YLW CONV (MISCELLANEOUS) ×2 IMPLANT
SLING ARM FOAM STRAP LRG (SOFTGOODS) IMPLANT
SLING ARM FOAM STRAP MED (SOFTGOODS) IMPLANT
STRIP CLOSURE SKIN 1/2X4 (GAUZE/BANDAGES/DRESSINGS) ×1 IMPLANT
SUT MNCRL AB 4-0 PS2 18 (SUTURE) ×1 IMPLANT
SUT PROLENE 6 0 BV (SUTURE) ×1 IMPLANT
SUT VIC AB 3-0 SH 27X BRD (SUTURE) ×1 IMPLANT
SYR 3ML LL SCALE MARK (SYRINGE) IMPLANT
TOWEL GREEN STERILE (TOWEL DISPOSABLE) ×1 IMPLANT
UNDERPAD 30X36 HEAVY ABSORB (UNDERPADS AND DIAPERS) ×1 IMPLANT
WATER STERILE IRR 1000ML POUR (IV SOLUTION) ×1 IMPLANT

## 2023-10-19 NOTE — Transfer of Care (Signed)
 Immediate Anesthesia Transfer of Care Note  Patient: Ronald Reeves  Procedure(s) Performed: RIGHT ARM ARTERIOVENOUS GRAFT CREATION (Right: Arm Upper)  Patient Location: PACU  Anesthesia Type:MAC  Level of Consciousness: awake, alert , and oriented  Airway & Oxygen Therapy: Patient Spontanous Breathing and Patient connected to nasal cannula oxygen  Post-op Assessment: Report given to RN and Post -op Vital signs reviewed and stable  Post vital signs: Reviewed and stable  Last Vitals:  Vitals Value Taken Time  BP 103/70 10/19/23 0930  Temp    Pulse 70 10/19/23 0932  Resp 31 10/19/23 0932  SpO2 97 % 10/19/23 0932  Vitals shown include unfiled device data.  Last Pain:  Vitals:   10/19/23 0646  PainSc: 0-No pain         Complications: No notable events documented.
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
## 2023-10-19 NOTE — Plan of Care (Signed)

## 2023-10-19 NOTE — Op Note (Signed)
 DATE OF SERVICE: 10/19/2023  PATIENT:  Ronald Reeves  68 y.o. male  PRE-OPERATIVE DIAGNOSIS:  ESRD  POST-OPERATIVE DIAGNOSIS:  Same  PROCEDURE:   Right upper arm arteriovenous graft (brachial fistula inflow --> axillary vein)  SURGEON:  Surgeons and Role:    * [name], MD - Primary  ASSISTANT: Aggie Moats, PA-C  An experienced assistant was required given the complexity of this procedure and the standard of surgical care. My assistant helped with exposure through counter tension, suctioning, ligation and retraction to better visualize the surgical field.  My assistant expedited sewing during the case by following my sutures. Wherever I use the term "we" in the report, my assistant actively helped me with that portion of the procedure.  ANESTHESIA:   regional and MAC  EBL: minimal  BLOOD ADMINISTERED:none  DRAINS: none   LOCAL MEDICATIONS USED:  NONE  SPECIMEN:  none  COUNTS: confirmed correct.  TOURNIQUET:  none  PATIENT DISPOSITION:  PACU - hemodynamically stable.   Delay start of Pharmacological VTE agent (>24hrs) due to surgical blood loss or risk of bleeding: no  INDICATION FOR PROCEDURE: [name] is a 68 y.o. male with ESRD in need of permanent dialysis access. After careful discussion of risks, benefits, and alternatives the patient was offered right arm arteriovenous graft. The patient understood and wished to proceed.  OPERATIVE FINDINGS: good inflow from thrombosed brachiobasilic arteriovenous fistula; reasonable axillary vein ~69mm. Good technical result from AVG. Doppler bruit at completion. Doppler flow in radial artery at completion.  DESCRIPTION OF PROCEDURE: After identification of the patient in the pre-operative holding area, the patient was transferred to the operating room. The patient was positioned supine on the operating room table. Anesthesia was induced. The right arm was prepped and draped in standard fashion. A surgical pause was performed  confirming correct patient, procedure, and operative location.  The right brachiobasilic fistula inflow was exposed using a longitudinal incision in the distal arm just above the antecubital fossa.  Incision was carried down through subcutaneous tissue until the fistula was encountered.  Clamps were applied proximally and distally to our planned anastomotic site. The clamp was flashed and good flow was noted.    The right brachial vein at the axilla was exposed using longitudinal incision just below the hairbearing area of the axilla.  Incision was carried down until the brachial sheath was encountered.  The brachial vein was identified, exposed, encircled with Silastic Vesseloops.   Using a curved, sheathed tunneling device, a 4-7 mm tapered Gore-Tex graft was tunneled subcutaneously and gentle arc across the biceps of the right arm.  Patient was then heparinized with 5000 units of IV heparin.   The brachial artery was clamped proximally and distally.  An anterior arteriotomy was made with an 11 blade.  This was extended with Potts scissors.  The 4 mm end of the Gore-Tex graft was spatulated and then anastomosed end-to-side to the brachial arteriotomy using continuous running suture of 6-0 Prolene.  The anastomosis was completed and hemostasis ensured.  The graft was clamped to restore perfusion to the hand.   The brachial vein was clamped proximally and distally.  An anterior venotomy was made with an 11 blade.  This was extended with Potts scissors.  The 7 mm end of the Gore-Tex graft was then anastomosed end to side to the brachial vein venotomy using continuous running suture of 6-0 Prolene.  Immediately prior to completion the anastomosis was de-aired and flushed.  Anastomosis was then completed.  Hemostasis was insured.   Doppler machine was brought onto the field to interrogate the graft. Doppler flow was noted in the radial artery.  About the arterial anastomosis flow was noted proximal and  distal to the arterial anastomosis.  Distal to the venous anastomosis a Doppler bruit was heard.  Satisfied we ended the case here.   Surgical beds were irrigated copiously.  Hemostasis was again ensured in the surgical beds.  The wounds were closed in layers using 3-0 Vicryl and 4-0 Monocryl.  Clean bandages were applied.   Upon completion of the case instrument and sharps counts were confirmed correct. The patient was transferred to the PACU in good condition. I was present for all portions of the procedure.  Rande Brunt. Lenell Antu, MD [facility] Phone Number: [phone] 10/19/2023 9:25 AM

## 2023-10-19 NOTE — Anesthesia Postprocedure Evaluation (Signed)
 Anesthesia Post Note  Patient: Ronald Reeves  Procedure(s) Performed: RIGHT ARM ARTERIOVENOUS GRAFT CREATION (Right: Arm Upper)     Patient location during evaluation: PACU Anesthesia Type: Regional Level of consciousness: awake and alert Pain management: pain level controlled Vital Signs Assessment: post-procedure vital signs reviewed and stable Respiratory status: spontaneous breathing, nonlabored ventilation and respiratory function stable Cardiovascular status: stable and blood pressure returned to baseline Anesthetic complications: no   No notable events documented.  Last Vitals:  Vitals:   10/19/23 0945 10/19/23 1000  BP: 107/80   Pulse: 76   Resp: 20   Temp:  36.5 C  SpO2: 97%     Last Pain:  Vitals:   10/19/23 0945  PainSc: 0-No pain                 Beryle Lathe

## 2023-10-19 NOTE — Care Management Obs Status (Signed)
 MEDICARE OBSERVATION STATUS NOTIFICATION   Patient Details  Name: Maxon Kresse MRN: [id number] Date of Birth: 18-Dec-1955   Medicare Observation Status Notification Given:       Harriet Masson, RN 10/19/2023, 1:51 PM

## 2023-10-19 NOTE — Progress Notes (Signed)
 After Pt was seen by SW with wife present, Pt stated he did not feel that he would be unsafe at home, which pt claimed is the only reason the surgeon admitted pt s/p AVF revision.. This RN requested pt wait till the doctor could be notified. Sent a secure chat to PA and paged PA at [phone] prior to pt leaving.  Pt refused to wait for response from PA. Pt's IV removed.  Pt provided AMA form and was signed by pt and this RN.
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
## 2023-10-19 NOTE — Anesthesia Procedure Notes (Addendum)
 Anesthesia Regional Block: Supraclavicular block   Pre-Anesthetic Checklist: , timeout performed,  Correct Patient, Correct Site, Correct Laterality,  Correct Procedure, Correct Position, site marked,  Risks and benefits discussed,  Surgical consent,  Pre-op evaluation,  At surgeon's request and post-op pain management  Laterality: Right  Prep: chloraprep       Needles:  Injection technique: Single-shot  Needle Type: Echogenic Needle     Needle Length: 5cm  Needle Gauge: 21     Additional Needles:   Narrative:  Start time: 10/19/2023 7:30 AM End time: 10/19/2023 7:34 AM Injection made incrementally with aspirations every 5 mL.  Performed by: Personally  Anesthesiologist: Beryle Lathe, MD  Additional Notes: No pain on injection. No increased resistance to injection. Injection made in 5cc increments. Good needle visualization. Patient tolerated the procedure well.
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
## 2023-10-20 ENCOUNTER — Encounter (HOSPITAL_COMMUNITY): Payer: Self-pay | Admitting: Vascular Surgery

## 2023-10-20 ENCOUNTER — Telehealth: Payer: Self-pay

## 2023-10-20 ENCOUNTER — Other Ambulatory Visit: Payer: Self-pay | Admitting: Vascular Surgery

## 2023-10-20 ENCOUNTER — Other Ambulatory Visit: Payer: Self-pay

## 2023-10-20 MED ORDER — OXYCODONE-ACETAMINOPHEN 5-325 MG PO TABS: 1.0000 | ORAL_TABLET | ORAL | 0 refills | Status: DC | PRN

## 2023-10-20 NOTE — Telephone Encounter (Signed)
 Patient called into office to explain that he had surgery with Dr. Lenell Antu yesterday, 3/10.  He left [facility] prior to his care being complete.  He is now experiencing pain from his surgical site.  Dr Lenell Antu consulted and Percocet 5-325 PO tablets requested to [facility] [address].

## 2023-10-22 ENCOUNTER — Other Ambulatory Visit (HOSPITAL_COMMUNITY): Payer: Self-pay

## 2023-10-23 ENCOUNTER — Ambulatory Visit (HOSPITAL_COMMUNITY)
Admission: RE | Admit: 2023-10-23 | Discharge: 2023-10-23 | Disposition: A | Discharge status: Home / Self Care | Attending: Internal Medicine | Admitting: Internal Medicine

## 2023-10-23 ENCOUNTER — Other Ambulatory Visit: Payer: Self-pay

## 2023-10-23 ENCOUNTER — Encounter (HOSPITAL_COMMUNITY)
Admission: RE | Disposition: A | Discharge status: Home / Self Care | Payer: Self-pay | Source: Home / Self Care | Attending: Internal Medicine

## 2023-10-23 DIAGNOSIS — Z992 Dependence on renal dialysis: Secondary | ICD-10-CM | POA: Insufficient documentation

## 2023-10-23 DIAGNOSIS — T82590A Other mechanical complication of surgically created arteriovenous fistula, initial encounter: Secondary | ICD-10-CM | POA: Insufficient documentation

## 2023-10-23 DIAGNOSIS — N186 End stage renal disease: Secondary | ICD-10-CM | POA: Insufficient documentation

## 2023-10-23 HISTORY — PX: TUNNELLED CATHETER EXCHANGE: CATH118373

## 2023-10-23 SURGERY — TUNNELLED CATHETER EXCHANGE
Anesthesia: LOCAL

## 2023-10-23 MED ORDER — LIDOCAINE HCL (PF) 1 % IJ SOLN
INTRAMUSCULAR | Status: AC
  Filled 2023-10-23: qty 30

## 2023-10-23 MED ORDER — ACETAMINOPHEN 325 MG PO TABS: 650.0000 mg | ORAL_TABLET | ORAL | Status: DC | PRN

## 2023-10-23 MED ORDER — SODIUM CHLORIDE 0.9 % IV SOLN: INTRAVENOUS | Status: DC

## 2023-10-23 MED ORDER — LIDOCAINE HCL (PF) 1 % IJ SOLN
INTRAMUSCULAR | Status: DC | PRN
  Administered 2023-10-23: 5 mL via SUBCUTANEOUS

## 2023-10-23 MED ORDER — ONDANSETRON HCL 4 MG/2ML IJ SOLN: 4.0000 mg | Freq: Four times a day (QID) | INTRAMUSCULAR | Status: DC | PRN

## 2023-10-23 MED ORDER — MIDAZOLAM HCL 2 MG/2ML IJ SOLN
INTRAMUSCULAR | Status: AC
  Filled 2023-10-23: qty 2

## 2023-10-23 MED ORDER — FENTANYL CITRATE (PF) 100 MCG/2ML IJ SOLN
INTRAMUSCULAR | Status: AC
  Filled 2023-10-23: qty 2

## 2023-10-23 MED ORDER — HEPARIN SODIUM (PORCINE) 1000 UNIT/ML IJ SOLN
INTRAMUSCULAR | Status: AC
Start: 2023-10-23 — End: ?
  Filled 2023-10-23: qty 10

## 2023-10-23 MED ORDER — HEPARIN (PORCINE) IN NACL 1000-0.9 UT/500ML-% IV SOLN
INTRAVENOUS | Status: DC | PRN
  Administered 2023-10-23: 500 mL

## 2023-10-23 MED ORDER — HEPARIN SODIUM (PORCINE) 1000 UNIT/ML IJ SOLN
INTRAMUSCULAR | Status: DC | PRN
  Administered 2023-10-23: 6200 [IU] via INTRAVENOUS

## 2023-10-23 SURGICAL SUPPLY — 3 items
GUIDEWIRE ZIPWIRE 035/150 ANGL (WIRE) IMPLANT
KIT PALINDROME-P 55CM (CATHETERS) IMPLANT
PACK EP LF (CUSTOM PROCEDURE TRAY) IMPLANT

## 2023-10-23 NOTE — Op Note (Signed)
 Patient presents with poor flows in his right femoral tunneled hemodialysis catheter (55 cm cuff to tip- Palindrome). He has a maturing AVG placed 10/19/23. On examination, aspiration from both ports is good however flushing from the arterial port is sluggish.  The cuff is nearly out. Chest x-ray confirms the catheter tip is appropriately positioned in the RA. The catheter cuff is 1/2cm deep to the exit site.    Summary:  1) The patient had a successful 55 cm CTT Palindrome hemodialysis catheter exchange in the right femofral vein. 2 ) Okay to use catheter immediately.  Description of procedure: The right leg and the catheter were prepped and draped in the usual sterile fashion. The exit site and adjacent tunnel tract were anesthetized with lidocaine 1% with epinephrine. The cuff was easily removed with manual traction. The catheter was withdrawn.  No venogram was performed due to contrast allergy and no premedication.  A hydrophilic wire was manipulated and advanced through the arterial port and the tip was parked in the SVC. The catheter was completely removed and noted to be entirely intact. A new 55 cm cuff to tip Palindrome catheter was inserted over the guidewire and the tip parked in the right atrium and at that point the cuff was approximately 1 cm deep to the exit site.   Aspiration and flushing of both limbs of the catheter confirmed excellent flow. No kinks were visible on fluoroscopic imaging. Both limbs of the catheter were locked with heparin and sterile caps were placed.   The hub was secured on to the chest wall with 2-0 nylon wing sutures. A 3-0 absorbable suture was used to secure the exit site with a purse string suture.  Sterile dressings were placed, and the patient returned to recovery in stable condition.  Sedation: none Sedation time: n/a  Contrast: 0 mL - contrast allergy, did not premedicate  Monitoring: Because of the patient's comorbid conditions and sedation during  the procedure, continuous EKG monitoring and O2 saturation monitoring was performed throughout the procedure by the RN. There were no abnormal arrhythmias encountered.  Complications: None.  Diagnoses:   T82.49XA Other complication of vascular dialysis catheter (Poor flows) N18.6 End stage renal disease  Z99.2 Dialysis dependence  Procedures Coding:  36581 Tunneled catheter exchange 77001  Fluoroscopy guidance for catheter exchange. Z6109 Contrast  Recommendations: Remove the suture in 3 weeks. 2.   Report any blood flow problems to [facility].  Discharge: The patient was discharged home in stable condition. The patient was given education regarding the care of the catheter and specific instructions in case of any problems.

## 2023-10-23 NOTE — Discharge Instructions (Signed)
 General care instructions: - You should be able to eat, drink, and resume your normal medications. - Avoid any strenuous activity for the remainder of the day. Potential complications: - You are bleeding at the exit or venotomy site and it will not stop with direct pressure; if there is a slow ooze apply pressure over the venotomy site neck region where the catheter can be felt for 5 minutes.  - You have a fever, swelling, see redness or feel heat over the tunnel or exit site. 3.  Medication instructions: - Continue routine medications unless otherwise instructed. 4. Please have your sutures removed at the hub site in 3 weeks at dialysis.

## 2023-10-26 ENCOUNTER — Encounter (HOSPITAL_COMMUNITY): Payer: Self-pay | Admitting: Internal Medicine
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
## 2023-11-15 ENCOUNTER — Emergency Department (HOSPITAL_COMMUNITY)

## 2023-11-15 ENCOUNTER — Emergency Department (HOSPITAL_COMMUNITY)
Admission: EM | Admit: 2023-11-15 | Discharge: 2023-11-15 | Discharge status: Against Medical Advice | Attending: Emergency Medicine | Admitting: Emergency Medicine

## 2023-11-15 ENCOUNTER — Other Ambulatory Visit: Payer: Self-pay

## 2023-11-15 ENCOUNTER — Encounter (HOSPITAL_COMMUNITY): Payer: Self-pay

## 2023-11-15 ENCOUNTER — Inpatient Hospital Stay (HOSPITAL_COMMUNITY)
Admission: EM | Admit: 2023-11-15 | Discharge: 2023-11-19 | DRG: 291 | Disposition: A | Discharge status: Home Health | Attending: Internal Medicine | Admitting: Internal Medicine

## 2023-11-15 ENCOUNTER — Encounter (HOSPITAL_COMMUNITY): Payer: Self-pay | Admitting: *Deleted

## 2023-11-15 DIAGNOSIS — E8779 Other fluid overload: Secondary | ICD-10-CM | POA: Diagnosis not present

## 2023-11-15 DIAGNOSIS — F1021 Alcohol dependence, in remission: Secondary | ICD-10-CM | POA: Diagnosis present

## 2023-11-15 DIAGNOSIS — Z91158 Patient's noncompliance with renal dialysis for other reason: Secondary | ICD-10-CM

## 2023-11-15 DIAGNOSIS — Z8673 Personal history of transient ischemic attack (TIA), and cerebral infarction without residual deficits: Secondary | ICD-10-CM

## 2023-11-15 DIAGNOSIS — I132 Hypertensive heart and chronic kidney disease with heart failure and with stage 5 chronic kidney disease, or end stage renal disease: Secondary | ICD-10-CM | POA: Diagnosis not present

## 2023-11-15 DIAGNOSIS — Z888 Allergy status to other drugs, medicaments and biological substances status: Secondary | ICD-10-CM

## 2023-11-15 DIAGNOSIS — Z7951 Long term (current) use of inhaled steroids: Secondary | ICD-10-CM

## 2023-11-15 DIAGNOSIS — D696 Thrombocytopenia, unspecified: Secondary | ICD-10-CM | POA: Diagnosis present

## 2023-11-15 DIAGNOSIS — M549 Dorsalgia, unspecified: Secondary | ICD-10-CM | POA: Diagnosis present

## 2023-11-15 DIAGNOSIS — N2581 Secondary hyperparathyroidism of renal origin: Secondary | ICD-10-CM | POA: Diagnosis present

## 2023-11-15 DIAGNOSIS — I440 Atrioventricular block, first degree: Secondary | ICD-10-CM | POA: Diagnosis present

## 2023-11-15 DIAGNOSIS — N186 End stage renal disease: Secondary | ICD-10-CM | POA: Insufficient documentation

## 2023-11-15 DIAGNOSIS — F17210 Nicotine dependence, cigarettes, uncomplicated: Secondary | ICD-10-CM | POA: Diagnosis present

## 2023-11-15 DIAGNOSIS — Z91041 Radiographic dye allergy status: Secondary | ICD-10-CM

## 2023-11-15 DIAGNOSIS — Z8616 Personal history of COVID-19: Secondary | ICD-10-CM

## 2023-11-15 DIAGNOSIS — J9811 Atelectasis: Secondary | ICD-10-CM | POA: Diagnosis present

## 2023-11-15 DIAGNOSIS — R0602 Shortness of breath: Secondary | ICD-10-CM | POA: Diagnosis present

## 2023-11-15 DIAGNOSIS — Z5982 Transportation insecurity: Secondary | ICD-10-CM

## 2023-11-15 DIAGNOSIS — R627 Adult failure to thrive: Secondary | ICD-10-CM | POA: Diagnosis present

## 2023-11-15 DIAGNOSIS — R600 Localized edema: Secondary | ICD-10-CM

## 2023-11-15 DIAGNOSIS — I5043 Acute on chronic combined systolic (congestive) and diastolic (congestive) heart failure: Secondary | ICD-10-CM | POA: Diagnosis present

## 2023-11-15 DIAGNOSIS — F1910 Other psychoactive substance abuse, uncomplicated: Secondary | ICD-10-CM | POA: Insufficient documentation

## 2023-11-15 DIAGNOSIS — Z7901 Long term (current) use of anticoagulants: Secondary | ICD-10-CM | POA: Insufficient documentation

## 2023-11-15 DIAGNOSIS — Z8659 Personal history of other mental and behavioral disorders: Secondary | ICD-10-CM

## 2023-11-15 DIAGNOSIS — R54 Age-related physical debility: Secondary | ICD-10-CM | POA: Diagnosis present

## 2023-11-15 DIAGNOSIS — F419 Anxiety disorder, unspecified: Secondary | ICD-10-CM | POA: Diagnosis present

## 2023-11-15 DIAGNOSIS — I2721 Secondary pulmonary arterial hypertension: Secondary | ICD-10-CM | POA: Diagnosis present

## 2023-11-15 DIAGNOSIS — Z86718 Personal history of other venous thrombosis and embolism: Secondary | ICD-10-CM

## 2023-11-15 DIAGNOSIS — F17290 Nicotine dependence, other tobacco product, uncomplicated: Secondary | ICD-10-CM | POA: Diagnosis present

## 2023-11-15 DIAGNOSIS — I480 Paroxysmal atrial fibrillation: Secondary | ICD-10-CM | POA: Diagnosis present

## 2023-11-15 DIAGNOSIS — G4730 Sleep apnea, unspecified: Secondary | ICD-10-CM | POA: Diagnosis present

## 2023-11-15 DIAGNOSIS — F1220 Cannabis dependence, uncomplicated: Secondary | ICD-10-CM | POA: Diagnosis present

## 2023-11-15 DIAGNOSIS — K219 Gastro-esophageal reflux disease without esophagitis: Secondary | ICD-10-CM | POA: Diagnosis present

## 2023-11-15 DIAGNOSIS — I2510 Atherosclerotic heart disease of native coronary artery without angina pectoris: Secondary | ICD-10-CM | POA: Diagnosis present

## 2023-11-15 DIAGNOSIS — Z992 Dependence on renal dialysis: Secondary | ICD-10-CM | POA: Insufficient documentation

## 2023-11-15 DIAGNOSIS — I5023 Acute on chronic systolic (congestive) heart failure: Secondary | ICD-10-CM | POA: Diagnosis present

## 2023-11-15 DIAGNOSIS — Z79899 Other long term (current) drug therapy: Secondary | ICD-10-CM

## 2023-11-15 DIAGNOSIS — F1024 Alcohol dependence with alcohol-induced mood disorder: Secondary | ICD-10-CM | POA: Diagnosis present

## 2023-11-15 DIAGNOSIS — Z8619 Personal history of other infectious and parasitic diseases: Secondary | ICD-10-CM

## 2023-11-15 DIAGNOSIS — G8929 Other chronic pain: Secondary | ICD-10-CM | POA: Diagnosis present

## 2023-11-15 DIAGNOSIS — J449 Chronic obstructive pulmonary disease, unspecified: Secondary | ICD-10-CM | POA: Diagnosis present

## 2023-11-15 DIAGNOSIS — I3481 Nonrheumatic mitral (valve) annulus calcification: Secondary | ICD-10-CM | POA: Diagnosis present

## 2023-11-15 DIAGNOSIS — Z9862 Peripheral vascular angioplasty status: Secondary | ICD-10-CM

## 2023-11-15 DIAGNOSIS — E785 Hyperlipidemia, unspecified: Secondary | ICD-10-CM | POA: Diagnosis present

## 2023-11-15 DIAGNOSIS — R45850 Homicidal ideations: Secondary | ICD-10-CM | POA: Diagnosis present

## 2023-11-15 DIAGNOSIS — Z8701 Personal history of pneumonia (recurrent): Secondary | ICD-10-CM

## 2023-11-15 DIAGNOSIS — F332 Major depressive disorder, recurrent severe without psychotic features: Secondary | ICD-10-CM | POA: Diagnosis not present

## 2023-11-15 DIAGNOSIS — R7303 Prediabetes: Secondary | ICD-10-CM | POA: Diagnosis present

## 2023-11-15 DIAGNOSIS — I10 Essential (primary) hypertension: Secondary | ICD-10-CM | POA: Diagnosis present

## 2023-11-15 DIAGNOSIS — R64 Cachexia: Secondary | ICD-10-CM | POA: Diagnosis present

## 2023-11-15 DIAGNOSIS — I252 Old myocardial infarction: Secondary | ICD-10-CM

## 2023-11-15 DIAGNOSIS — R45851 Suicidal ideations: Secondary | ICD-10-CM | POA: Diagnosis not present

## 2023-11-15 DIAGNOSIS — Z6820 Body mass index (BMI) 20.0-20.9, adult: Secondary | ICD-10-CM

## 2023-11-15 DIAGNOSIS — N400 Enlarged prostate without lower urinary tract symptoms: Secondary | ICD-10-CM | POA: Diagnosis present

## 2023-11-15 DIAGNOSIS — G928 Other toxic encephalopathy: Secondary | ICD-10-CM | POA: Diagnosis present

## 2023-11-15 DIAGNOSIS — R011 Cardiac murmur, unspecified: Secondary | ICD-10-CM | POA: Diagnosis present

## 2023-11-15 DIAGNOSIS — Z8249 Family history of ischemic heart disease and other diseases of the circulatory system: Secondary | ICD-10-CM

## 2023-11-15 DIAGNOSIS — D631 Anemia in chronic kidney disease: Secondary | ICD-10-CM | POA: Diagnosis present

## 2023-11-15 DIAGNOSIS — M1990 Unspecified osteoarthritis, unspecified site: Secondary | ICD-10-CM | POA: Diagnosis present

## 2023-11-15 DIAGNOSIS — F1420 Cocaine dependence, uncomplicated: Secondary | ICD-10-CM | POA: Diagnosis not present

## 2023-11-15 DIAGNOSIS — Z5329 Procedure and treatment not carried out because of patient's decision for other reasons: Secondary | ICD-10-CM | POA: Diagnosis present

## 2023-11-15 DIAGNOSIS — Z9889 Other specified postprocedural states: Secondary | ICD-10-CM

## 2023-11-15 DIAGNOSIS — Y900 Blood alcohol level of less than 20 mg/100 ml: Secondary | ICD-10-CM | POA: Diagnosis present

## 2023-11-15 DIAGNOSIS — R636 Underweight: Secondary | ICD-10-CM | POA: Diagnosis present

## 2023-11-15 DIAGNOSIS — J9601 Acute respiratory failure with hypoxia: Secondary | ICD-10-CM | POA: Diagnosis present

## 2023-11-15 DIAGNOSIS — M542 Cervicalgia: Secondary | ICD-10-CM | POA: Diagnosis present

## 2023-11-15 DIAGNOSIS — Z9151 Personal history of suicidal behavior: Secondary | ICD-10-CM

## 2023-11-15 LAB — SALICYLATE LEVEL: Salicylate Lvl: <7 mg/dL — ABNORMAL LOW (ref 7.0–30.0)

## 2023-11-15 LAB — COMPREHENSIVE METABOLIC PANEL WITH GFR
ALT: 10 U/L (ref 0–44)
ALT: 10 U/L (ref 0–44)
AST: 21 U/L (ref 15–41)
AST: 21 U/L (ref 15–41)
Albumin: 3.3 g/dL — ABNORMAL LOW (ref 3.5–5.0)
Albumin: 3.4 g/dL — ABNORMAL LOW (ref 3.5–5.0)
Alkaline Phosphatase: 122 U/L (ref 38–126)
Alkaline Phosphatase: 114 U/L (ref 38–126)
Anion gap: 18 — ABNORMAL HIGH (ref 5–15)
Anion gap: 17 — ABNORMAL HIGH (ref 5–15)
BUN: 46 mg/dL — ABNORMAL HIGH (ref 8–23)
BUN: 50 mg/dL — ABNORMAL HIGH (ref 8–23)
CO2: 22 mmol/L (ref 22–32)
CO2: 20 mmol/L — ABNORMAL LOW (ref 22–32)
Calcium: 9.5 mg/dL (ref 8.9–10.3)
Calcium: 9.5 mg/dL (ref 8.9–10.3)
Chloride: 97 mmol/L — ABNORMAL LOW (ref 98–111)
Chloride: 98 mmol/L (ref 98–111)
Creatinine, Ser: 10.03 mg/dL — ABNORMAL HIGH (ref 0.61–1.24)
Creatinine, Ser: 10.2 mg/dL — ABNORMAL HIGH (ref 0.61–1.24)
GFR, Estimated: 5 mL/min — ABNORMAL LOW (ref >60)
GFR, Estimated: 5 mL/min — ABNORMAL LOW (ref >60)
Glucose, Bld: 140 mg/dL — ABNORMAL HIGH (ref 70–99)
Glucose, Bld: 151 mg/dL — ABNORMAL HIGH (ref 70–99)
Potassium: 3.7 mmol/L (ref 3.5–5.1)
Potassium: 4.6 mmol/L (ref 3.5–5.1)
Sodium: 137 mmol/L (ref 135–145)
Sodium: 135 mmol/L (ref 135–145)
Total Bilirubin: 0.9 mg/dL (ref 0.0–1.2)
Total Bilirubin: 0.8 mg/dL (ref 0.0–1.2)
Total Protein: 7.4 g/dL (ref 6.5–8.1)
Total Protein: 7.2 g/dL (ref 6.5–8.1)

## 2023-11-15 LAB — BRAIN NATRIURETIC PEPTIDE: B Natriuretic Peptide: >4500 pg/mL — ABNORMAL HIGH (ref 0.0–100.0)

## 2023-11-15 LAB — CBC
HCT: 37.7 % — ABNORMAL LOW (ref 39.0–52.0)
Hemoglobin: 12.1 g/dL — ABNORMAL LOW (ref 13.0–17.0)
MCH: 27.7 pg (ref 26.0–34.0)
MCHC: 32.1 g/dL (ref 30.0–36.0)
MCV: 86.3 fL (ref 80.0–100.0)
Platelets: 111 10*3/uL — ABNORMAL LOW (ref 150–400)
RBC: 4.37 MIL/uL (ref 4.22–5.81)
RDW: 18.1 % — ABNORMAL HIGH (ref 11.5–15.5)
WBC: 5.6 10*3/uL (ref 4.0–10.5)
nRBC: 0 % (ref 0.0–0.2)

## 2023-11-15 LAB — CBC WITH DIFFERENTIAL/PLATELET
Abs Immature Granulocytes: 0.02 10*3/uL (ref 0.00–0.07)
Basophils Absolute: 0 10*3/uL (ref 0.0–0.1)
Basophils Relative: 1 %
Eosinophils Absolute: 0.2 10*3/uL (ref 0.0–0.5)
Eosinophils Relative: 4 %
HCT: 39.1 % (ref 39.0–52.0)
Hemoglobin: 12.5 g/dL — ABNORMAL LOW (ref 13.0–17.0)
Immature Granulocytes: 0 %
Lymphocytes Relative: 23 %
Lymphs Abs: 1.4 10*3/uL (ref 0.7–4.0)
MCH: 27.5 pg (ref 26.0–34.0)
MCHC: 32 g/dL (ref 30.0–36.0)
MCV: 85.9 fL (ref 80.0–100.0)
Monocytes Absolute: 0.6 10*3/uL (ref 0.1–1.0)
Monocytes Relative: 11 %
Neutro Abs: 3.5 10*3/uL (ref 1.7–7.7)
Neutrophils Relative %: 61 %
Platelets: 143 10*3/uL — ABNORMAL LOW (ref 150–400)
RBC: 4.55 MIL/uL (ref 4.22–5.81)
RDW: 18.1 % — ABNORMAL HIGH (ref 11.5–15.5)
WBC: 5.8 10*3/uL (ref 4.0–10.5)
nRBC: 0 % (ref 0.0–0.2)

## 2023-11-15 LAB — HEPATITIS B SURFACE ANTIGEN: Hepatitis B Surface Ag: NONREACTIVE

## 2023-11-15 LAB — ETHANOL: Alcohol, Ethyl (B): <10 mg/dL (ref <10)

## 2023-11-15 LAB — TROPONIN I (HIGH SENSITIVITY)
Troponin I (High Sensitivity): 73 ng/L — ABNORMAL HIGH (ref <18)
Troponin I (High Sensitivity): 75 ng/L — ABNORMAL HIGH (ref <18)

## 2023-11-15 LAB — ACETAMINOPHEN LEVEL: Acetaminophen (Tylenol), Serum: <10 ug/mL — ABNORMAL LOW (ref 10–30)

## 2023-11-15 LAB — LIPASE, BLOOD: Lipase: 30 U/L (ref 11–51)

## 2023-11-15 MED ORDER — ROSUVASTATIN CALCIUM 5 MG PO TABS
10.0000 mg | ORAL_TABLET | Freq: Every day | ORAL | Status: DC
  Administered 2023-11-16 – 2023-11-19 (×4): 10 mg via ORAL
  Filled 2023-11-15 (×4): qty 2

## 2023-11-15 MED ORDER — FUROSEMIDE 10 MG/ML IJ SOLN
40.0000 mg | Freq: Once | INTRAMUSCULAR | Status: AC
  Administered 2023-11-15: 40 mg via INTRAVENOUS
  Filled 2023-11-15: qty 4

## 2023-11-15 MED ORDER — ANTICOAGULANT SODIUM CITRATE 4% (200MG/5ML) IV SOLN
5.0000 mL | Status: DC | PRN
  Filled 2023-11-15: qty 5

## 2023-11-15 MED ORDER — LIDOCAINE HCL (PF) 1 % IJ SOLN: 5.0000 mL | INTRAMUSCULAR | Status: DC | PRN

## 2023-11-15 MED ORDER — SEVELAMER CARBONATE 800 MG PO TABS
1600.0000 mg | ORAL_TABLET | Freq: Three times a day (TID) | ORAL | Status: DC
Start: 2023-11-15 — End: 2023-11-19
  Administered 2023-11-16 – 2023-11-19 (×8): 1600 mg via ORAL
  Filled 2023-11-15 (×9): qty 2

## 2023-11-15 MED ORDER — OLANZAPINE 5 MG PO TBDP: 10.0000 mg | ORAL_TABLET | Freq: Once | ORAL | Status: DC | PRN

## 2023-11-15 MED ORDER — MIRTAZAPINE 15 MG PO TABS
30.0000 mg | ORAL_TABLET | Freq: Every day | ORAL | Status: DC
  Administered 2023-11-16 (×2): 30 mg via ORAL
  Filled 2023-11-15 (×2): qty 2

## 2023-11-15 MED ORDER — MIDAZOLAM HCL 2 MG/2ML IJ SOLN
2.0000 mg | Freq: Once | INTRAMUSCULAR | Status: AC
  Administered 2023-11-15: 2 mg via INTRAMUSCULAR
  Filled 2023-11-15: qty 2

## 2023-11-15 MED ORDER — HEPARIN SODIUM (PORCINE) 1000 UNIT/ML DIALYSIS
2500.0000 [IU] | Freq: Once | INTRAMUSCULAR | Status: DC
  Filled 2023-11-15: qty 3

## 2023-11-15 MED ORDER — AMLODIPINE BESYLATE 10 MG PO TABS
10.0000 mg | ORAL_TABLET | Freq: Every day | ORAL | Status: DC
  Administered 2023-11-16 – 2023-11-19 (×3): 10 mg via ORAL
  Filled 2023-11-15 (×3): qty 1
  Filled 2023-11-15: qty 2

## 2023-11-15 MED ORDER — UMECLIDINIUM BROMIDE 62.5 MCG/ACT IN AEPB
1.0000 | INHALATION_SPRAY | Freq: Every day | RESPIRATORY_TRACT | Status: DC
  Administered 2023-11-15 – 2023-11-19 (×3): 1 via RESPIRATORY_TRACT
  Filled 2023-11-15 (×2): qty 7

## 2023-11-15 MED ORDER — ALTEPLASE 2 MG IJ SOLR: 2.0000 mg | Freq: Once | INTRAMUSCULAR | Status: DC | PRN

## 2023-11-15 MED ORDER — TAMSULOSIN HCL 0.4 MG PO CAPS
0.4000 mg | ORAL_CAPSULE | Freq: Every day | ORAL | Status: DC
Start: 2023-11-15 — End: 2023-11-19
  Administered 2023-11-16 – 2023-11-18 (×4): 0.4 mg via ORAL
  Filled 2023-11-15 (×4): qty 1

## 2023-11-15 MED ORDER — CARVEDILOL 12.5 MG PO TABS
12.5000 mg | ORAL_TABLET | Freq: Two times a day (BID) | ORAL | Status: DC
  Administered 2023-11-16 – 2023-11-19 (×7): 12.5 mg via ORAL
  Filled 2023-11-15 (×7): qty 1

## 2023-11-15 MED ORDER — ALBUTEROL SULFATE HFA 108 (90 BASE) MCG/ACT IN AERS
2.0000 | INHALATION_SPRAY | Freq: Four times a day (QID) | RESPIRATORY_TRACT | Status: DC | PRN
  Filled 2023-11-15: qty 6.7

## 2023-11-15 MED ORDER — CHLORHEXIDINE GLUCONATE CLOTH 2 % EX PADS
6.0000 | MEDICATED_PAD | Freq: Every day | CUTANEOUS | Status: DC
  Administered 2023-11-17 – 2023-11-19 (×2): 6 via TOPICAL

## 2023-11-15 MED ORDER — HEPARIN SODIUM (PORCINE) 1000 UNIT/ML DIALYSIS
1000.0000 [IU] | INTRAMUSCULAR | Status: DC | PRN
  Filled 2023-11-15: qty 1

## 2023-11-15 MED ORDER — NEPRO/CARBSTEADY PO LIQD
237.0000 mL | ORAL | Status: DC | PRN
  Filled 2023-11-15: qty 237

## 2023-11-15 MED ORDER — ONDANSETRON 4 MG PO TBDP
4.0000 mg | ORAL_TABLET | Freq: Once | ORAL | Status: AC | PRN
  Administered 2023-11-15: 4 mg via ORAL
  Filled 2023-11-15: qty 1

## 2023-11-15 MED ORDER — PENTAFLUOROPROP-TETRAFLUOROETH EX AERO: 1.0000 | INHALATION_SPRAY | CUTANEOUS | Status: DC | PRN

## 2023-11-15 MED ORDER — ARFORMOTEROL TARTRATE 15 MCG/2ML IN NEBU
15.0000 ug | INHALATION_SOLUTION | Freq: Two times a day (BID) | RESPIRATORY_TRACT | Status: DC
  Administered 2023-11-15 – 2023-11-19 (×4): 15 ug via RESPIRATORY_TRACT
  Filled 2023-11-15 (×8): qty 2

## 2023-11-15 MED ORDER — OXYCODONE-ACETAMINOPHEN 5-325 MG PO TABS
1.0000 | ORAL_TABLET | ORAL | Status: DC | PRN
  Filled 2023-11-15: qty 1

## 2023-11-15 MED ORDER — DIPHENHYDRAMINE HCL 50 MG/ML IJ SOLN
25.0000 mg | Freq: Once | INTRAMUSCULAR | Status: AC
  Administered 2023-11-15: 25 mg via INTRAMUSCULAR
  Filled 2023-11-15: qty 1

## 2023-11-15 MED ORDER — ISOSORBIDE MONONITRATE ER 30 MG PO TB24
30.0000 mg | ORAL_TABLET | Freq: Every day | ORAL | Status: DC
  Administered 2023-11-16 – 2023-11-19 (×4): 30 mg via ORAL
  Filled 2023-11-15 (×4): qty 1

## 2023-11-15 MED ORDER — HEPARIN SODIUM (PORCINE) 1000 UNIT/ML DIALYSIS: 1500.0000 [IU] | INTRAMUSCULAR | Status: DC | PRN

## 2023-11-15 MED ORDER — HYDRALAZINE HCL 25 MG PO TABS
25.0000 mg | ORAL_TABLET | Freq: Two times a day (BID) | ORAL | Status: DC
  Administered 2023-11-16 – 2023-11-19 (×7): 25 mg via ORAL
  Filled 2023-11-15 (×8): qty 1

## 2023-11-15 MED ORDER — APIXABAN 5 MG PO TABS
5.0000 mg | ORAL_TABLET | Freq: Two times a day (BID) | ORAL | Status: DC
  Administered 2023-11-16 – 2023-11-19 (×8): 5 mg via ORAL
  Filled 2023-11-15 (×8): qty 1

## 2023-11-15 MED ORDER — ALBUTEROL SULFATE (2.5 MG/3ML) 0.083% IN NEBU
2.5000 mg | INHALATION_SOLUTION | Freq: Four times a day (QID) | RESPIRATORY_TRACT | Status: DC | PRN
  Administered 2023-11-15 – 2023-11-19 (×2): 2.5 mg via RESPIRATORY_TRACT
  Filled 2023-11-15 (×2): qty 3

## 2023-11-15 MED ORDER — ESCITALOPRAM OXALATE 20 MG PO TABS
20.0000 mg | ORAL_TABLET | Freq: Every day | ORAL | Status: DC
  Administered 2023-11-16 (×2): 20 mg via ORAL
  Filled 2023-11-15: qty 2
  Filled 2023-11-15: qty 1

## 2023-11-15 MED ORDER — ZIPRASIDONE MESYLATE 20 MG IM SOLR
20.0000 mg | Freq: Once | INTRAMUSCULAR | Status: AC
  Administered 2023-11-15: 20 mg via INTRAMUSCULAR
  Filled 2023-11-15: qty 20

## 2023-11-15 MED ORDER — LIDOCAINE-PRILOCAINE 2.5-2.5 % EX CREA: 1.0000 | TOPICAL_CREAM | CUTANEOUS | Status: DC | PRN

## 2023-11-15 NOTE — ED Triage Notes (Signed)
 The pt has been vomiting every am he has missed full dialysis for 3 weeks  he has bi-lateral swollen legs and feet

## 2023-11-15 NOTE — ED Notes (Signed)
 This NT spoke to pt's wife in the lobby. Pt wife wanted to make sure that pt would receive dialysis. Apparently pt's dialysis center spoke with wife the other day and stated that if pt didn't receive dialysis he would have to have his leg amputated. NT relayed the message to primary RN.

## 2023-11-15 NOTE — ED Triage Notes (Signed)
 Dialysis graft in his rt upper arm not used yet  old fistula rt upper leg

## 2023-11-15 NOTE — ED Notes (Signed)
 Patients clothing removed and all belongings placed in locker 3 in purple zone.

## 2023-11-15 NOTE — ED Provider Notes (Signed)
 [place] EMERGENCY DEPARTMENT AT [facility] Provider Note   CSN: [id number] Arrival date & time: 11/15/23  0258     History  Chief Complaint  Patient presents with   Leg Swelling    Ronald Reeves is a 68 y.o. male.  The history is provided by the patient.  Ronald Reeves is a 68 y.o. male who presents to the Emergency Department complaining of leg swelling.  He presents to the emergency department for evaluation of progressive lower extremity edema and needing dialysis.  He reports that over the last 3 weeks he has been unable to complete a dialysis session.  His last session was on Saturday and he only got about 1 hour of his dialysis before leaving.  He has been experiencing vomiting every morning as well as frequent diarrhea.  He also reports not sleeping well.  He states that he is up 5 pounds.  He does report intermittent chest pain as well as shortness of breath.  He dialyzes Tuesday, Thursday, Saturday through a vascular catheter in his right groin.  He is using cocaine, last use was shortly prior to ED arrival.  No tobacco.  No alcohol.  He does have a history of cardiomyopathy.  He is inconsistently compliant with his medications. Also uses marijuana.     Home Medications Prior to Admission medications   Medication Sig Start Date End Date Taking? Authorizing Provider  albuterol (VENTOLIN HFA) 108 (90 Base) MCG/ACT inhaler Inhale 2 puffs into the lungs every 6 (six) hours as needed (COPD).    [provider]  amLODipine (NORVASC) 10 MG tablet Take 1 tablet (10 mg total) by mouth daily. 01/01/18   Lewayne Bunting, MD  amoxicillin-clavulanate (AUGMENTIN) 250-125 MG tablet Take 1 tablet by mouth 2 (two) times daily. 09/01/23   [provider]  apixaban (ELIQUIS) 5 MG TABS tablet Take 1 tablet (5 mg total) by mouth 2 (two) times daily. 03/06/23   Atway, Rayann N, DO  B Complex-C-Folic Acid (DIALYVITE TABLET) TABS Take 1 tablet by mouth daily. 12/23/22   [provider]  carvedilol (COREG) 12.5 MG tablet Take 12.5 mg by mouth 2 (two) times daily with a meal.    [provider]  escitalopram (LEXAPRO) 20 MG tablet Take 20 mg by mouth at bedtime.    [provider]  ferric citrate (AURYXIA) 1 GM 210 MG(Fe) tablet Take 420 mg by mouth 3 (three) times daily with meals.    [provider]  furosemide (LASIX) 40 MG tablet Take 40 mg by mouth daily.    [provider]  hydrALAZINE (APRESOLINE) 25 MG tablet Take 25 mg by mouth 2 (two) times daily.    [provider]  isosorbide mononitrate (IMDUR) 30 MG 24 hr tablet Take 30 mg by mouth daily.    [provider]  mirtazapine (REMERON) 30 MG tablet Take 30 mg by mouth at bedtime.    [provider]  naloxone [facility]) nasal spray 4 mg/0.1 mL Place 0.4 mg into the nose See admin instructions.  SPRAY 1 SPRAY INTO ONE NOSTRIL AS DIRECTED AS NEEDED FOR RESCUE FOR OPIOID OVERDOSE - CALL 911 IMMEDIATELY, ADMINISTER DOSE, THEN TURN PERSON ON SIDE - IF NO RESPONSE IN 2-3 MINUTES OR PERSON RESPONDS BUT RELAPSES, REPEAT USING A NEW SPRAY DEVICE AND SPRAY INTO THE OTHER NOSTRIL AS NEEDED FOR RESCUE 01/21/23   [provider]  omeprazole (PRILOSEC) 40 MG capsule Take 1 capsule (40 mg total) by mouth daily.  Patient taking differently: Take 40 mg by mouth daily as needed. 06/06/22   Arnaldo Natal, NP  oxyCODONE-acetaminophen (PERCOCET/ROXICET) 5-325 MG tablet Take 1 tablet by mouth every 4 (four) hours as needed for severe pain (pain score 7-10). 10/20/23   [name], MD  PSYLLIUM PO Take by mouth. 09/03/23   [provider]  rosuvastatin (CRESTOR) 10 MG tablet Take 10 mg by mouth daily.    [provider]  sevelamer carbonate (RENVELA) 800 MG tablet Take 1,600 mg by mouth 3 (three) times daily with meals. 01/06/23   [provider]  sorbitol 70 % solution Take 10 mLs by mouth 3 (three) times daily as needed  (constipation). Patient not taking: Reported on 08/26/2023 01/20/23   [provider]  tamsulosin (FLOMAX) 0.4 MG CAPS capsule Take 0.4 mg by mouth at bedtime.    [provider]  Tiotropium Bromide-Olodaterol 2.5-2.5 MCG/ACT AERS Take 2 puffs by mouth in the morning. 08/07/23   [provider]      Allergies    Enalapril, Gabapentin, and Iodinated contrast media    Review of Systems   Review of Systems  All other systems reviewed and are negative.   Physical Exam Updated Vital Signs BP 134/84   Pulse 82   Temp 98.1 F (36.7 C)   Resp 17   Ht 5\' 11"  (1.803 m)   Wt 64 kg   SpO2 100%   BMI 19.68 kg/m  Physical Exam Vitals and nursing note reviewed.  Constitutional:      Appearance: He is well-developed.  HENT:     Head: Normocephalic and atraumatic.  Cardiovascular:     Rate and Rhythm: Normal rate and regular rhythm.  Pulmonary:     Effort: Pulmonary effort is normal. No respiratory distress.     Breath sounds: Normal breath sounds.  Abdominal:     Palpations: Abdomen is soft.     Tenderness: There is no abdominal tenderness. There is no guarding or rebound.  Musculoskeletal:        General: No tenderness.     Comments: 2-3+ pitting edema in ble  Skin:    General: Skin is warm and dry.  Neurological:     Mental Status: He is alert and oriented to person, place, and time.     ED Results / Procedures / Treatments   Labs (all labs ordered are listed, but only abnormal results are displayed) Labs Reviewed  COMPREHENSIVE METABOLIC PANEL WITH GFR - Abnormal; Notable for the following components:      Result Value   Chloride 97 (*)    Glucose, Bld 140 (*)    BUN 46 (*)    Creatinine, Ser 10.03 (*)    Albumin 3.3 (*)    GFR, Estimated 5 (*)    Anion gap 18 (*)    All other components within normal limits  CBC - Abnormal; Notable for the following components:   Hemoglobin 12.1 (*)    HCT 37.7 (*)    RDW 18.1 (*)    Platelets 111 (*)     All other components within normal limits  BRAIN NATRIURETIC PEPTIDE - Abnormal; Notable for the following components:   B Natriuretic Peptide >4,500.0 (*)    All other components within normal limits  TROPONIN I (HIGH SENSITIVITY) - Abnormal; Notable for the following components:   Troponin I (High Sensitivity) 73 (*)    All other components within normal limits  LIPASE, BLOOD  TROPONIN I (HIGH SENSITIVITY)  EKG None  Radiology No results found.  Procedures Procedures    Medications Ordered in ED Medications  ondansetron (ZOFRAN-ODT) disintegrating tablet 4 mg (4 mg Oral Given 11/15/23 0317)  furosemide (LASIX) injection 40 mg (40 mg Intravenous Given 11/15/23 0450)    ED Course/ Medical Decision Making/ A&P                                 Medical Decision Making Amount and/or Complexity of Data Reviewed Labs: ordered.  Risk Prescription drug management.   Patient with history of substance use disorder, ESRD on hemodialysis, cardiomyopathy here for evaluation of progressive lower extremity edema, desire for dialysis due to inability to complete a full session.  Labs with normal potassium, electrolytes.  Hemoglobin is stable.  Given patient's complaints recommend EKG, chest x-ray, additional evaluation for his lower extremity edema.  Patient refuses additional interventions aside from furosemide.  He does take this medication at home, is agreeable to an IV dose.  He does still make urine.  After receiving his IV furosemide patient left the emergency department AGAINST MEDICAL ADVICE and would not allow any additional treatment or evaluation.  Discussed with patient risk for adverse cardiac outcome.        Final Clinical Impression(s) / ED Diagnoses Final diagnoses:  Other hypervolemia  Peripheral edema  ESRD (end stage renal disease) on dialysis [facility])  Polysubstance abuse [facility])    Rx / DC Orders ED Discharge Orders     None         Tilden Fossa,  MD 11/15/23 [phone]

## 2023-11-15 NOTE — ED Notes (Signed)
 Pt refused to let NT take an EKG.

## 2023-11-15 NOTE — ED Notes (Signed)
 X-ray refused by patient

## 2023-11-15 NOTE — ED Notes (Signed)
 Pt refused to be hooked up to the monitor or to have an EKG done. He said "all I want is the medicine to pull the water off of my legs and go home"

## 2023-11-15 NOTE — ED Notes (Signed)
 Pt refuses Cardiac monitoring .

## 2023-11-15 NOTE — ED Provider Notes (Signed)
 [place] EMERGENCY DEPARTMENT AT [facility] Provider Note   CSN: [id number] Arrival date & time: 11/15/23  1104     History  Chief Complaint  Patient presents with   Aggressive Behavior   IVC    Ronald Reeves is a 68 y.o. male.  68 yo M with a cc of increased agitation.  Patient IVC by family, reported refusing dialysis threatening to kill himself.    Patient agitated tells me he is going to start hurting people if we dont leave him alone.  Level V caveat agitation.         Home Medications Prior to Admission medications   Medication Sig Start Date End Date Taking? Authorizing Provider  albuterol (VENTOLIN HFA) 108 (90 Base) MCG/ACT inhaler Inhale 2 puffs into the lungs every 6 (six) hours as needed (COPD).    [provider]  amLODipine (NORVASC) 10 MG tablet Take 1 tablet (10 mg total) by mouth daily. 01/01/18   Lewayne Bunting, MD  amoxicillin-clavulanate (AUGMENTIN) 250-125 MG tablet Take 1 tablet by mouth 2 (two) times daily. 09/01/23   [provider]  apixaban (ELIQUIS) 5 MG TABS tablet Take 1 tablet (5 mg total) by mouth 2 (two) times daily. 03/06/23   Atway, Rayann N, DO  B Complex-C-Folic Acid (DIALYVITE TABLET) TABS Take 1 tablet by mouth daily. 12/23/22   [provider]  carvedilol (COREG) 12.5 MG tablet Take 12.5 mg by mouth 2 (two) times daily with a meal.    [provider]  escitalopram (LEXAPRO) 20 MG tablet Take 20 mg by mouth at bedtime.    [provider]  ferric citrate (AURYXIA) 1 GM 210 MG(Fe) tablet Take 420 mg by mouth 3 (three) times daily with meals.    [provider]  furosemide (LASIX) 40 MG tablet Take 40 mg by mouth daily.    [provider]  hydrALAZINE (APRESOLINE) 25 MG tablet Take 25 mg by mouth 2 (two) times daily.    [provider]  isosorbide mononitrate (IMDUR) 30 MG 24 hr tablet Take 30 mg by mouth daily.    [provider]  mirtazapine (REMERON)  30 MG tablet Take 30 mg by mouth at bedtime.    [provider]  naloxone [facility]) nasal spray 4 mg/0.1 mL Place 0.4 mg into the nose See admin instructions.  SPRAY 1 SPRAY INTO ONE NOSTRIL AS DIRECTED AS NEEDED FOR RESCUE FOR OPIOID OVERDOSE - CALL 911 IMMEDIATELY, ADMINISTER DOSE, THEN TURN PERSON ON SIDE - IF NO RESPONSE IN 2-3 MINUTES OR PERSON RESPONDS BUT RELAPSES, REPEAT USING A NEW SPRAY DEVICE AND SPRAY INTO THE OTHER NOSTRIL AS NEEDED FOR RESCUE 01/21/23   [provider]  omeprazole (PRILOSEC) 40 MG capsule Take 1 capsule (40 mg total) by mouth daily. Patient taking differently: Take 40 mg by mouth daily as needed. 06/06/22   Arnaldo Natal, NP  oxyCODONE-acetaminophen (PERCOCET/ROXICET) 5-325 MG tablet Take 1 tablet by mouth every 4 (four) hours as needed for severe pain (pain score 7-10). 10/20/23   [name], MD  PSYLLIUM PO Take by mouth. 09/03/23   [provider]  rosuvastatin (CRESTOR) 10 MG tablet Take 10 mg by mouth daily.    [provider]  sevelamer carbonate (RENVELA) 800 MG tablet Take 1,600 mg by mouth 3 (three) times daily with meals. 01/06/23   [provider]  sorbitol 70 % solution Take 10 mLs by mouth 3 (three) times daily as needed (constipation). Patient  not taking: Reported on 08/26/2023 01/20/23   [provider]  tamsulosin (FLOMAX) 0.4 MG CAPS capsule Take 0.4 mg by mouth at bedtime.    [provider]  Tiotropium Bromide-Olodaterol 2.5-2.5 MCG/ACT AERS Take 2 puffs by mouth in the morning. 08/07/23   [provider]      Allergies    Enalapril, Gabapentin, and Iodinated contrast media    Review of Systems   Review of Systems  Physical Exam Updated Vital Signs BP (!) 132/97   Pulse 95   Temp 97.7 F (36.5 C) (Oral)   Resp (!) 22   Ht 5\' 11"  (1.803 m)   Wt 64 kg   SpO2 100%   BMI 19.68 kg/m  Physical Exam Vitals and nursing note reviewed.  Constitutional:       Appearance: He is well-developed.  HENT:     Head: Normocephalic and atraumatic.  Eyes:     Pupils: Pupils are equal, round, and reactive to light.  Neck:     Vascular: No JVD.  Cardiovascular:     Rate and Rhythm: Normal rate and regular rhythm.     Heart sounds: No murmur heard.    No friction rub. No gallop.  Pulmonary:     Effort: No respiratory distress.     Breath sounds: No wheezing.  Abdominal:     General: There is no distension.     Tenderness: There is no abdominal tenderness. There is no guarding or rebound.  Musculoskeletal:        General: Normal range of motion.     Cervical back: Normal range of motion and neck supple.  Skin:    Coloration: Skin is not pale.     Findings: No rash.  Neurological:     Mental Status: He is alert.  Psychiatric:        Speech: Speech is tangential.        Behavior: Behavior is agitated.        Thought Content: Thought content includes homicidal ideation. Thought content includes homicidal plan.     ED Results / Procedures / Treatments   Labs (all labs ordered are listed, but only abnormal results are displayed) Labs Reviewed  CBC WITH DIFFERENTIAL/PLATELET - Abnormal; Notable for the following components:      Result Value   Hemoglobin 12.5 (*)    RDW 18.1 (*)    Platelets 143 (*)    All other components within normal limits  SALICYLATE LEVEL - Abnormal; Notable for the following components:   Salicylate Lvl <7.0 (*)    All other components within normal limits  ACETAMINOPHEN LEVEL - Abnormal; Notable for the following components:   Acetaminophen (Tylenol), Serum <10 (*)    All other components within normal limits  COMPREHENSIVE METABOLIC PANEL WITH GFR - Abnormal; Notable for the following components:   CO2 20 (*)    Glucose, Bld 151 (*)    BUN 50 (*)    Creatinine, Ser 10.20 (*)    Albumin 3.4 (*)    GFR, Estimated 5 (*)    Anion gap 17 (*)    All other components within normal limits  ETHANOL  RAPID URINE DRUG  SCREEN, HOSP PERFORMED  CBC WITH DIFFERENTIAL/PLATELET  HEPATITIS B SURFACE ANTIGEN  HEPATITIS B SURFACE ANTIBODY, QUANTITATIVE    EKG EKG Interpretation Date/Time:  Sunday November 15 2023 12:06:30 EDT Ventricular Rate:  82 PR Interval:  231 QRS Duration:  108 QT Interval:  411 QTC Calculation: 480 R Axis:   -  32  Text Interpretation: Sinus rhythm Prolonged PR interval Probable left atrial enlargement LVH with secondary repolarization abnormality Anterior infarct, old st depression in inferior and lateral leads seen on prior No significant change since last tracing Confirmed by Melene Plan [phone]) on 11/15/2023 12:35:32 PM  Radiology DG CHEST PORT 1 VIEW Result Date: 11/15/2023 CLINICAL DATA:  Shortness of breath.  End-stage renal disease. EXAM: PORTABLE CHEST 1 VIEW COMPARISON:  Two-view chest x-ray 08/30/2023 FINDINGS: The heart is enlarged. Atherosclerotic changes are present the aortic arch. Mild interstitial edema is present. Left basilar airspace disease likely reflects atelectasis. Degenerative changes are present at both shoulders, stable. IMPRESSION: 1. Cardiomegaly and mild interstitial edema compatible with congestive heart failure. 2. Left basilar airspace disease likely reflects atelectasis. Electronically Signed   By: Marin Roberts M.D.   On: 11/15/2023 18:17   CT Head Wo Contrast Result Date: 11/15/2023 CLINICAL DATA:  Altered mental status. EXAM: CT HEAD WITHOUT CONTRAST TECHNIQUE: Contiguous axial images were obtained from the base of the skull through the vertex without intravenous contrast. RADIATION DOSE REDUCTION: This exam was performed according to the departmental dose-optimization program which includes automated exposure control, adjustment of the mA and/or kV according to patient size and/or use of iterative reconstruction technique. COMPARISON:  06/10/2023. FINDINGS: Brain: No acute intracranial hemorrhage, midline shift or mass effect is seen. No extra-axial fluid  collection. Mild diffuse atrophy is noted. Periventricular white matter hypodensities are present bilaterally. No hydrocephalus. Vascular: No hyperdense vessel or unexpected calcification. Skull: Normal. Negative for fracture or focal lesion. Sinuses/Orbits: Round opacities are noted in the right sphenoid sinus and left ethmoid sinus. No acute orbital abnormality. 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 No acute abnormality is seen. IMPRESSION: 1. No acute intracranial process. 2. Atrophy with chronic microvascular ischemic changes. Electronically Signed   By: Thornell Sartorius M.D.   On: 11/15/2023 14:13    Procedures Procedures    Medications Ordered in ED Medications  amLODipine (NORVASC) tablet 10 mg (0 mg Oral Hold 11/15/23 1751)  apixaban (ELIQUIS) tablet 5 mg (has no administration in time range)  carvedilol (COREG) tablet 12.5 mg (0 mg Oral Hold 11/15/23 1752)  escitalopram (LEXAPRO) tablet 20 mg (has no administration in time range)  hydrALAZINE (APRESOLINE) tablet 25 mg (0 mg Oral Hold 11/15/23 1751)  isosorbide mononitrate (IMDUR) 24 hr tablet 30 mg (0 mg Oral Hold 11/15/23 1751)  mirtazapine (REMERON) tablet 30 mg (has no administration in time range)  oxyCODONE-acetaminophen (PERCOCET/ROXICET) 5-325 MG per tablet 1 tablet (has no administration in time range)  rosuvastatin (CRESTOR) tablet 10 mg (0 mg Oral Hold 11/15/23 1752)  sevelamer carbonate (RENVELA) tablet 1,600 mg (0 mg Oral Hold 11/15/23 1752)  tamsulosin (FLOMAX) capsule 0.4 mg (has no administration in time range)  arformoterol (BROVANA) nebulizer solution 15 mcg (has no administration in time range)    And  umeclidinium bromide (INCRUSE ELLIPTA) 62.5 MCG/ACT 1 puff (1 puff Inhalation Given 11/15/23 1615)  albuterol (PROVENTIL) (2.5 MG/3ML) 0.083% nebulizer solution 2.5 mg (2.5 mg Nebulization Given 11/15/23 1655)  OLANZapine zydis (ZYPREXA) disintegrating tablet 10 mg (has no administration in time range)   Chlorhexidine Gluconate Cloth 2 % PADS 6 each (has no administration in time range)  ziprasidone (GEODON) injection 20 mg (20 mg Intramuscular Given 11/15/23 1123)  midazolam (VERSED) injection 2 mg (2 mg Intramuscular Given 11/15/23 1124)  diphenhydrAMINE (BENADRYL) injection 25 mg (25 mg Intramuscular Given 11/15/23 1124)  midazolam (VERSED) injection 2 mg (2  mg Intramuscular Given 11/15/23 1238)  midazolam (VERSED) injection 2 mg (2 mg Intramuscular Given 11/15/23 1332)    ED Course/ Medical Decision Making/ A&P                                 Medical Decision Making Amount and/or Complexity of Data Reviewed Labs: ordered. Radiology: ordered.  Risk Prescription drug management.   68 yo M with chief complaints of acute agitation.  The patient was brought in by police under IVC.  He reportedly telling his family that he is refusing dialysis. Reportedly to end his own life.  The patient does at 1 point during my encounter stated that he wished he was dead.  He also tells me he plans on hurting everyone in the emergency department and is going to do only least expected.  Will chemically sedate here.  CT of the head blood work.  Patient was actually seen very early this morning.  Had blood work without significant finding.  6:39 PM the patient's wife had somehow made into the emergency department and was demanding to speak with me.  She told me that he needs dialysis immediately or he may die.  She tells me that he has been doing illegal drugs quite frequently has been throwing up every morning is why he has not been getting his dialysis performed.  She told me he has not slept in probably 3 nights.  I had a discussion with her about how he will get dialysis while he is in the hospital but has no obvious urgent need for dialysis.  I told her the case to be discussed with the nephrologist.  She threatened to forcefully drag him out of the hospital and take him to the [place], [place].   CT of the head  without acute abnormality.   Psych recommends admission  The patients results and plan were reviewed and discussed.   Any x-rays performed were independently reviewed by myself.   Differential diagnosis were considered with the presenting HPI.  Medications  amLODipine (NORVASC) tablet 10 mg (0 mg Oral Hold 11/15/23 1751)  apixaban (ELIQUIS) tablet 5 mg (has no administration in time range)  carvedilol (COREG) tablet 12.5 mg (0 mg Oral Hold 11/15/23 1752)  escitalopram (LEXAPRO) tablet 20 mg (has no administration in time range)  hydrALAZINE (APRESOLINE) tablet 25 mg (0 mg Oral Hold 11/15/23 1751)  isosorbide mononitrate (IMDUR) 24 hr tablet 30 mg (0 mg Oral Hold 11/15/23 1751)  mirtazapine (REMERON) tablet 30 mg (has no administration in time range)  oxyCODONE-acetaminophen (PERCOCET/ROXICET) 5-325 MG per tablet 1 tablet (has no administration in time range)  rosuvastatin (CRESTOR) tablet 10 mg (0 mg Oral Hold 11/15/23 1752)  sevelamer carbonate (RENVELA) tablet 1,600 mg (0 mg Oral Hold 11/15/23 1752)  tamsulosin (FLOMAX) capsule 0.4 mg (has no administration in time range)  arformoterol (BROVANA) nebulizer solution 15 mcg (has no administration in time range)    And  umeclidinium bromide (INCRUSE ELLIPTA) 62.5 MCG/ACT 1 puff (1 puff Inhalation Given 11/15/23 1615)  albuterol (PROVENTIL) (2.5 MG/3ML) 0.083% nebulizer solution 2.5 mg (2.5 mg Nebulization Given 11/15/23 1655)  OLANZapine zydis (ZYPREXA) disintegrating tablet 10 mg (has no administration in time range)  Chlorhexidine Gluconate Cloth 2 % PADS 6 each (has no administration in time range)  ziprasidone (GEODON) injection 20 mg (20 mg Intramuscular Given 11/15/23 1123)  midazolam (VERSED) injection 2 mg (2 mg Intramuscular Given 11/15/23 1124)  diphenhydrAMINE (BENADRYL) injection 25 mg (25 mg Intramuscular Given 11/15/23 1124)  midazolam (VERSED) injection 2 mg (2 mg Intramuscular Given 11/15/23 1238)  midazolam (VERSED) injection 2 mg (2 mg  Intramuscular Given 11/15/23 1332)    Vitals:   11/15/23 1215 11/15/23 1330 11/15/23 1745 11/15/23 1800  BP: 130/81 (!) 150/87  (!) 132/97  Pulse: 81 89 96 95  Resp: 18 (!) 22  (!) 22  Temp:      TempSrc:      SpO2: 100% 98% 92% 100%  Weight:      Height:        Final diagnoses:  Suicidal ideation             Final Clinical Impression(s) / ED Diagnoses Final diagnoses:  Suicidal ideation    Rx / DC Orders ED Discharge Orders     None         Melene Plan, DO 11/15/23 1839

## 2023-11-15 NOTE — Consult Note (Cosign Needed Addendum)
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 Vascular;  Laterality: Left;   REVISON OF ARTERIOVENOUS FISTULA Left 05/01/2022   Procedure: ARTERIOVENOUS FISTULA WASHOUT OF ARM HEMATOMA;  Surgeon: [name], MD;  Location: [facility] OR;  Service: Vascular;  Laterality: Left;   TUNNELLED CATHETER EXCHANGE N/A 10/23/2023   Procedure: TUNNELLED CATHETER EXCHANGE;  Surgeon: Tyler Pita, MD;  [facility];  Service: Cardiovascular;  Laterality: N/A;   UPPER EXTREMITY VENOGRAPHY Bilateral 08/28/2023   Procedure: UPPER EXTREMITY VENOGRAPHY;  Surgeon: [name], MD;  [facility];  Service: Cardiovascular;  Laterality: Bilateral;     Medications:   Current Facility-Administered Medications:    albuterol (PROVENTIL) (2.5 MG/3ML) 0.083% nebulizer solution 2.5 mg, 2.5 mg, Nebulization, Q6H PRN, Adela Lank, Dan, DO   amLODipine (NORVASC) tablet 10 mg, 10 mg, Oral, Daily, Adela Lank, Dan, DO   apixaban (ELIQUIS) tablet 5 mg, 5 mg, Oral, BID, Adela Lank, Dan, DO   arformoterol (BROVANA) nebulizer solution 15 mcg, 15 mcg, Nebulization, BID **AND** umeclidinium bromide (INCRUSE ELLIPTA) 62.5 MCG/ACT 1 puff, 1 puff, Inhalation, Daily, Adela Lank, Dan, DO   carvedilol (COREG) tablet 12.5  mg, 12.5 mg, Oral, BID WC, Floyd, Dan, DO   escitalopram (LEXAPRO) tablet 20 mg, 20 mg, Oral, QHS, Floyd, Dan, DO   hydrALAZINE (APRESOLINE) tablet 25 mg, 25 mg, Oral, BID, Floyd, Dan, DO   isosorbide mononitrate (IMDUR) 24 hr tablet 30 mg, 30 mg, Oral, Daily, Adela Lank, Dan, DO   mirtazapine (REMERON) tablet 30 mg, 30 mg, Oral, QHS, Floyd, Dan, DO   oxyCODONE-acetaminophen (PERCOCET/ROXICET) 5-325 MG per tablet 1 tablet, 1 tablet, Oral,  Q4H PRN, Melene Plan, DO   rosuvastatin (CRESTOR) tablet 10 mg, 10 mg, Oral, Daily, Adela Lank, Dan, DO   sevelamer carbonate (RENVELA) tablet 1,600 mg, 1,600 mg, Oral, TID WC, Melene Plan, DO   tamsulosin (FLOMAX) capsule 0.4 mg, 0.4 mg, Oral, QHS, Melene Plan, DO  Current Outpatient Medications:    albuterol (VENTOLIN HFA) 108 (90 Base) MCG/ACT inhaler, Inhale 2 puffs into the lungs every 6 (six) hours as needed (COPD)., Disp: , Rfl:    amLODipine (NORVASC) 10 MG tablet, Take 1 tablet (10 mg total) by mouth daily., Disp: 90 tablet, Rfl: 3   amoxicillin-clavulanate (AUGMENTIN) 250-125 MG tablet, Take 1 tablet by mouth 2 (two) times daily., Disp: , Rfl:    apixaban (ELIQUIS) 5 MG TABS tablet, Take 1 tablet (5 mg total) by mouth 2 (two) times daily., Disp: , Rfl:    B Complex-C-Folic Acid (DIALYVITE TABLET) TABS, Take 1 tablet by mouth daily., Disp: , Rfl:    carvedilol (COREG) 12.5 MG tablet, Take 12.5 mg by mouth 2 (two) times daily with a meal., Disp: , Rfl:    escitalopram (LEXAPRO) 20 MG tablet, Take 20 mg by mouth at bedtime., Disp: , Rfl:    ferric citrate (AURYXIA) 1 GM 210 MG(Fe) tablet, Take 420 mg by mouth 3 (three) times daily with meals., Disp: , Rfl:    furosemide (LASIX) 40 MG tablet, Take 40 mg by mouth daily., Disp: , Rfl:    hydrALAZINE (APRESOLINE) 25 MG tablet, Take 25 mg by mouth 2 (two) times daily., Disp: , Rfl:    isosorbide mononitrate (IMDUR) 30 MG 24 hr tablet, Take 30 mg by mouth daily., Disp: , Rfl:    mirtazapine (REMERON) 30 MG tablet, Take 30  mg by mouth at bedtime., Disp: , Rfl:    naloxone (NARCAN) nasal spray 4 mg/0.1 mL, Place 0.4 mg into the nose See admin instructions.  SPRAY 1 SPRAY INTO ONE NOSTRIL AS DIRECTED AS NEEDED FOR RESCUE FOR OPIOID OVERDOSE - CALL 911 IMMEDIATELY, ADMINISTER DOSE, THEN TURN PERSON ON SIDE - IF NO RESPONSE IN 2-3 MINUTES OR PERSON RESPONDS BUT RELAPSES, REPEAT USING A NEW SPRAY DEVICE AND SPRAY INTO THE OTHER NOSTRIL AS NEEDED FOR RESCUE, Disp: , Rfl:    omeprazole (PRILOSEC) 40 MG capsule, Take 1 capsule (40 mg total) by mouth daily. (Patient taking differently: Take 40 mg by mouth daily as needed.), Disp: 30 capsule, Rfl: 2   oxyCODONE-acetaminophen (PERCOCET/ROXICET) 5-325 MG tablet, Take 1 tablet by mouth every 4 (four) hours as needed for severe pain (pain score 7-10)., Disp: 30 tablet, Rfl: 0   PSYLLIUM PO, Take by mouth., Disp: , Rfl:    rosuvastatin (CRESTOR) 10 MG tablet, Take 10 mg by mouth daily., Disp: , Rfl:    sevelamer carbonate (RENVELA) 800 MG tablet, Take 1,600 mg by mouth 3 (three) times daily with meals., Disp: , Rfl:    sorbitol 70 % solution, Take 10 mLs by mouth 3 (three) times daily as needed (constipation). (Patient not taking: Reported on 08/26/2023), Disp: , Rfl:    tamsulosin (FLOMAX) 0.4 MG CAPS capsule, Take 0.4 mg by mouth at bedtime., Disp: , Rfl:    Tiotropium Bromide-Olodaterol 2.5-2.5 MCG/ACT AERS, Take 2 puffs by mouth in the morning., Disp: , Rfl:   Allergies: Allergies  Allergen Reactions   Enalapril Other (See Comments)    Renal failure syndrome   Gabapentin     Encephalopathy, tremor   Iodinated Contrast Media Nausea And Vomiting    Aurther Loft  Gildardo Griffes, NP

## 2023-11-15 NOTE — ED Notes (Signed)
 Pt left AMA. Was told his test results and advised to stay but patient got dressed, had me pull his iv and walked out of the er. He said he was going to go to the [facility] hospital. Refused to sign the AMA document.

## 2023-11-15 NOTE — ED Notes (Signed)
 Patient is calm and cooperative at this time, patient had soiled himself. Was cleaned up and put clean scrubs on him. Patient told sitter to just cut his underwear off and throw them away.

## 2023-11-15 NOTE — Consult Note (Signed)
 Consult received and chart reviewed.  Patient attempted to be seen, but appreciably upon attempt to evaluate the patient, patient severely sedated and unable to participate in any meaningful evaluation at this time.   From chart review, patient appreciably severely agitated earlier, making threats to harm staff and endorsing desires and wishes to be dead, which required multiple heavy emergent pharmacological interventions to sedate the patient for safety.  From MAR, appreciably received multiple doses of Versed, Geodon, and Benadryl.  Discussed with nursing to notify consult provider when patient is able to participate in meaningful evaluation. Discussed with primary EDP this provider's attempt to see the patient; advised that this provider would attempt to see the patient at a later time.

## 2023-11-15 NOTE — ED Triage Notes (Signed)
 Patient bib [employer] after he was IVC'ed by his wife. 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 He is yelling on arrival to ED and threatening that someone is going to be hurt within the next 24hours.

## 2023-11-15 NOTE — ED Notes (Signed)
 IVC paperwork completed Envelope #[id number]

## 2023-11-15 NOTE — Consult Note (Addendum)
 Renal Service Consult Note [facility]  Ronald Reeves 11/15/2023 [name], MD Requesting Physician: Melene Plan, ED MD  Reason for Consult: ESRD patient who presented with leg swelling and missed dialysis HPI: The patient is a 68 y.o. year-old w/ PMH as below who presented to ED today earlier complaining of progressive lower extremity swelling and need for dialysis.  States he has not had dialysis in 2 or 3 weeks. Of note, per ED MD notes the patient was IVC'd by family, they reported his refusing dialysis threatening to kill himself.    Pt seen in ED. Pt is visibly SOB when moving around. Okay with sitting. Main c/o DOE. Some coughing. No CP or abd pain. Pt has new RUA AVG placed 3/10 by Dr Randie Heinz, he says they are supposed to start using it next week. Has L fem TDC they are using for now.   ROS - denies CP, no joint pain, no HA, no blurry vision, no rash, no diarrhea, no nausea/ vomiting  PMH: Anemia Anxiety Arthritis COPD CAD history of MI ESRD on HD HTN Substance abuse  Past Surgical History  Past Surgical History:  Procedure Laterality Date   A/V FISTULAGRAM Right 06/17/2023   Procedure: A/V Fistulagram;  Surgeon: Victorino Sparrow, MD;  Location: [facility] INVASIVE CV LAB;  Service: Cardiovascular;  Laterality: Right;   A/V FISTULAGRAM Right 07/17/2023   Procedure: A/V Fistulagram;  Surgeon: [name], MD;  Location: [facility] INVASIVE CV LAB;  Service: Cardiovascular;  Laterality: Right;   AV FISTULA PLACEMENT Left 04/12/2021   Procedure: LEFT ARM ARTERIOVENOUS (AV) FISTULA CREATION;  Surgeon: Cephus Shelling, MD;  [facility];  Service: Vascular;  Laterality: Left;   AV FISTULA PLACEMENT Left 10/18/2021   Procedure: LEFT ARM BRACHIOBASILIC FISTULA CREATION FIRST STAGE;  Surgeon: Cephus Shelling, MD;  [facility];  Service: Vascular;  Laterality: Left;   AV FISTULA PLACEMENT Right 11/28/2022   Procedure: RIGHT ARM FIRST STAGE BRACHIOBASILIC FISTULA  CREATION;  Surgeon: Cephus Shelling, MD;  [facility];  Service: Vascular;  Laterality: Right;   AV FISTULA PLACEMENT Right 10/19/2023   Procedure: RIGHT ARM ARTERIOVENOUS GRAFT CREATION;  Surgeon: [name], MD;  [facility];  Service: Vascular;  Laterality: Right;   BACK SURGERY     BASCILIC VEIN TRANSPOSITION Left 01/27/2022   Procedure: LEFT SECOND STAGE BASILIC VEIN TRANSPOSITION;  Surgeon: Cephus Shelling, MD;  [facility];  Service: Vascular;  Laterality: Left;   BASCILIC VEIN TRANSPOSITION Right 02/18/2023   Procedure: SECOND STAGE RIGHT ARM BASILIC VEIN TRANSPOSITION;  Surgeon: Cephus Shelling, MD;  Location: [facility] OR;  Service: Vascular;  Laterality: Right;   BIOPSY  08/20/2022   Procedure: BIOPSY;  Surgeon: Shellia Cleverly, DO;  [facility]: [facility];  Service: Gastroenterology;;   COLONOSCOPY WITH PROPOFOL N/A 08/20/2022   Procedure: COLONOSCOPY WITH PROPOFOL;  Surgeon: Shellia Cleverly, DO;  [facility];  Service: Gastroenterology;  Laterality: N/A;   DIALYSIS/PERMA CATHETER INSERTION Right 03/05/2023   Procedure: DIALYSIS/PERMA CATHETER INSERTION;  Surgeon: Cephus Shelling, MD;  Location: [facility] INVASIVE CV LAB;  Service: Cardiovascular;  Laterality: Right;   ESOPHAGOGASTRODUODENOSCOPY (EGD) WITH PROPOFOL N/A 08/20/2022   Procedure: ESOPHAGOGASTRODUODENOSCOPY (EGD) WITH PROPOFOL;  Surgeon: Shellia Cleverly, DO;  [facility];  Service: Gastroenterology;  Laterality: N/A;   INSERTION OF DIALYSIS CATHETER Right 05/01/2022   Procedure: INSERTION OF DIALYSIS CATHETER;  Surgeon: [name], MD;  [facility];  Service: Vascular;  Laterality: Right;   INSERTION OF DIALYSIS CATHETER Right 07/10/2023   Procedure: ULTRASOUNDED GUIDED INSERTION OF TUNNELED  DIALYSIS CATHETER TO RIGHT FEMORAL VEIN;  Surgeon: Cephus Shelling, MD;  [facility];  Service: Vascular;  Laterality: Right;   IR REMOVAL TUN CV CATH W/O FL  12/19/2022    LIGATION OF COMPETING BRANCHES OF ARTERIOVENOUS FISTULA Left 07/15/2021   Procedure: LIGATION OF COMPETING BRANCHES OF LEFT RADIOCEPHALIC ARTERIOVENOUS FISTULA TIMES TWO;  Surgeon: Cephus Shelling, MD;  [facility];  Service: Vascular;  Laterality: Left;   PERIPHERAL VASCULAR BALLOON ANGIOPLASTY  07/17/2023   Procedure: PERIPHERAL VASCULAR BALLOON ANGIOPLASTY;  Surgeon: [name], MD;  [facility];  Service: Cardiovascular;;   PERIPHERAL VASCULAR INTERVENTION  06/17/2023   Procedure: PERIPHERAL VASCULAR INTERVENTION;  Surgeon: Victorino Sparrow, MD;  Location: [facility] INVASIVE CV LAB;  Service: Cardiovascular;;   POLYPECTOMY  08/20/2022   Procedure: POLYPECTOMY;  Surgeon: Shellia Cleverly, DO;  [facility];  Service: Gastroenterology;;   REVISON OF ARTERIOVENOUS FISTULA Left 07/15/2021   Procedure: REVISON OF LEFT ARTERIOVENOUS FISTULA;  Surgeon: Cephus Shelling, MD;  Location: [facility] OR;  Service: Vascular;  Laterality: Left;   REVISON OF ARTERIOVENOUS FISTULA Left 05/01/2022   Procedure: ARTERIOVENOUS FISTULA WASHOUT OF ARM HEMATOMA;  Surgeon: [name], MD;  Location: [facility] OR;  Service: Vascular;  Laterality: Left;   TUNNELLED CATHETER EXCHANGE N/A 10/23/2023   Procedure: TUNNELLED CATHETER EXCHANGE;  Surgeon: Tyler Pita, MD;  [facility];  Service: Cardiovascular;  Laterality: N/A;   UPPER EXTREMITY VENOGRAPHY Bilateral 08/28/2023   Procedure: UPPER EXTREMITY VENOGRAPHY;  Surgeon: [name], MD;  [facility];  Service: Cardiovascular;  Laterality: Bilateral;   Family History  Family History  Problem Relation Age of Onset   Heart disease Mother    Heart attack Sister 106   Social History  reports that he has been smoking cigars and cigarettes. He has been exposed to tobacco smoke. He has never used smokeless tobacco. He reports that he does not currently use alcohol after a past usage of about 14.0  standard drinks of alcohol per week. He reports current drug use. Frequency: 1.00 time per week. Drugs: Marijuana and Cocaine. Allergies  Allergies  Allergen Reactions   Enalapril Other (See Comments)    Renal failure syndrome   Gabapentin     Encephalopathy, tremor   Iodinated Contrast Media Nausea And Vomiting   Home medications Prior to Admission medications   Medication Sig Start Date End Date Taking? Authorizing Provider  albuterol (VENTOLIN HFA) 108 (90 Base) MCG/ACT inhaler Inhale 2 puffs into the lungs every 6 (six) hours as needed (COPD).    [provider]  amLODipine (NORVASC) 10 MG tablet Take 1 tablet (10 mg total) by mouth daily. 01/01/18   Lewayne Bunting, MD  amoxicillin-clavulanate (AUGMENTIN) 250-125 MG tablet Take 1 tablet by mouth 2 (two) times daily. 09/01/23   [provider]  apixaban (ELIQUIS) 5 MG TABS tablet Take 1 tablet (5 mg total) by mouth 2 (two) times daily. 03/06/23   Atway, Rayann N, DO  B Complex-C-Folic Acid (DIALYVITE TABLET) TABS Take 1 tablet by mouth daily. 12/23/22   [provider]  carvedilol (COREG) 12.5 MG tablet Take 12.5 mg by mouth 2 (two) times daily with a meal.    [provider]  escitalopram (LEXAPRO) 20 MG tablet Take 20 mg by mouth at bedtime.  [provider]  ferric citrate (AURYXIA) 1 GM 210 MG(Fe) tablet Take 420 mg by mouth 3 (three) times daily with meals.    [provider]  furosemide (LASIX) 40 MG tablet Take 40 mg by mouth daily.    [provider]  hydrALAZINE (APRESOLINE) 25 MG tablet Take 25 mg by mouth 2 (two) times daily.    [provider]  isosorbide mononitrate (IMDUR) 30 MG 24 hr tablet Take 30 mg by mouth daily.    [provider]  mirtazapine (REMERON) 30 MG tablet Take 30 mg by mouth at bedtime.    [provider]  naloxone [facility]) nasal spray 4 mg/0.1 mL Place 0.4 mg into the nose See admin instructions.  SPRAY 1 SPRAY INTO  ONE NOSTRIL AS DIRECTED AS NEEDED FOR RESCUE FOR OPIOID OVERDOSE - CALL 911 IMMEDIATELY, ADMINISTER DOSE, THEN TURN PERSON ON SIDE - IF NO RESPONSE IN 2-3 MINUTES OR PERSON RESPONDS BUT RELAPSES, REPEAT USING A NEW SPRAY DEVICE AND SPRAY INTO THE OTHER NOSTRIL AS NEEDED FOR RESCUE 01/21/23   [provider]  omeprazole (PRILOSEC) 40 MG capsule Take 1 capsule (40 mg total) by mouth daily. Patient taking differently: Take 40 mg by mouth daily as needed. 06/06/22   Arnaldo Natal, NP  oxyCODONE-acetaminophen (PERCOCET/ROXICET) 5-325 MG tablet Take 1 tablet by mouth every 4 (four) hours as needed for severe pain (pain score 7-10). 10/20/23   [name], MD  PSYLLIUM PO Take by mouth. 09/03/23   [provider]  rosuvastatin (CRESTOR) 10 MG tablet Take 10 mg by mouth daily.    [provider]  sevelamer carbonate (RENVELA) 800 MG tablet Take 1,600 mg by mouth 3 (three) times daily with meals. 01/06/23   [provider]  sorbitol 70 % solution Take 10 mLs by mouth 3 (three) times daily as needed (constipation). Patient not taking: Reported on 08/26/2023 01/20/23   [provider]  tamsulosin (FLOMAX) 0.4 MG CAPS capsule Take 0.4 mg by mouth at bedtime.    [provider]  Tiotropium Bromide-Olodaterol 2.5-2.5 MCG/ACT AERS Take 2 puffs by mouth in the morning. 08/07/23   [provider]     Vitals:   11/15/23 1110 11/15/23 1112  BP: (!) 144/97   Pulse: 94   Resp: 20   Temp: 97.7 F (36.5 C)   TempSrc: Oral   SpO2: 100%   Weight:  64 kg  Height:  5\' 11"  (1.803 m)   Exam Gen alert, no distress, mild DOE No rash, cyanosis or gangrene Sclera anicteric, throat clear  +JVD Chest  rales 1/3 up R base, L clear  Cor RRR no MRG Abd soft ntnd no mass or ascites +bs GU nl male MS no joint effusions or deformity Ext 2+ bilat pitting pretib edema, no other edema Neuro is alert, Ox 3 , nf  RUA AVG+bruit (placed 3/10, to start  using next wk)/ R fem TDC      Renal-related home meds: Norvasc 10 mg daily Coreg 12.5 mg twice daily Auryxia 420 mg AC 3 times daily Lasix 40 mg daily Hydralazine 25 mg twice daily Renvela 1600 mg AC 3 times daily Dialyvite daily Others: Flomax, statin, Percocet, Narcan, PPI, Remeron, Imdur 30, Lexapro, albuterol    OP HD: [facility] TTS 4h   64kg   B400   2K bath  R fem TDC   Heparin 2500    Assessment/ Plan ESRD: on HD TTS. Missed several HD sessions. Pt is SOB  w/ minimal exertion. Will need HD, plan HD tonight.  IVC: family placed pt into IVC due to suicidal ideation HTN: BP"s up a bit, cont home meds.  Volume: +LE edema, rales on exam, vol overloaded. Get wt's after HD.  Anemia of esrd: Hb 12 here, follow.  Secondary hyperparathyroidism: CCa in range, add on phos. Cont binders w/ meals. HD access: says they were going to start using his new RUA AVG this week. Has a groin catheter using for now.     Vinson Moselle  MD [facility] 11/15/2023, 1:59 PM  Recent Labs  Lab 11/15/23 0318 11/15/23 1130  HGB 12.1* 12.5*  ALBUMIN 3.3* 3.4*  CALCIUM 9.5 9.5  CREATININE 10.03* 10.20*  K 3.7 4.6   Inpatient medications:

## 2023-11-16 ENCOUNTER — Emergency Department (HOSPITAL_COMMUNITY)

## 2023-11-16 ENCOUNTER — Encounter (HOSPITAL_COMMUNITY): Payer: Self-pay | Admitting: Internal Medicine

## 2023-11-16 DIAGNOSIS — F1024 Alcohol dependence with alcohol-induced mood disorder: Secondary | ICD-10-CM | POA: Diagnosis present

## 2023-11-16 DIAGNOSIS — I480 Paroxysmal atrial fibrillation: Secondary | ICD-10-CM | POA: Diagnosis present

## 2023-11-16 DIAGNOSIS — G928 Other toxic encephalopathy: Secondary | ICD-10-CM | POA: Diagnosis present

## 2023-11-16 DIAGNOSIS — D631 Anemia in chronic kidney disease: Secondary | ICD-10-CM | POA: Diagnosis present

## 2023-11-16 DIAGNOSIS — I10 Essential (primary) hypertension: Secondary | ICD-10-CM | POA: Diagnosis not present

## 2023-11-16 DIAGNOSIS — N2581 Secondary hyperparathyroidism of renal origin: Secondary | ICD-10-CM | POA: Diagnosis present

## 2023-11-16 DIAGNOSIS — R627 Adult failure to thrive: Secondary | ICD-10-CM | POA: Diagnosis present

## 2023-11-16 DIAGNOSIS — J9811 Atelectasis: Secondary | ICD-10-CM | POA: Diagnosis present

## 2023-11-16 DIAGNOSIS — I5023 Acute on chronic systolic (congestive) heart failure: Secondary | ICD-10-CM | POA: Diagnosis present

## 2023-11-16 DIAGNOSIS — Z992 Dependence on renal dialysis: Secondary | ICD-10-CM | POA: Diagnosis not present

## 2023-11-16 DIAGNOSIS — E785 Hyperlipidemia, unspecified: Secondary | ICD-10-CM | POA: Diagnosis present

## 2023-11-16 DIAGNOSIS — F332 Major depressive disorder, recurrent severe without psychotic features: Secondary | ICD-10-CM | POA: Diagnosis present

## 2023-11-16 DIAGNOSIS — Y900 Blood alcohol level of less than 20 mg/100 ml: Secondary | ICD-10-CM | POA: Diagnosis present

## 2023-11-16 DIAGNOSIS — R45851 Suicidal ideations: Principal | ICD-10-CM

## 2023-11-16 DIAGNOSIS — N186 End stage renal disease: Secondary | ICD-10-CM | POA: Diagnosis present

## 2023-11-16 DIAGNOSIS — E782 Mixed hyperlipidemia: Secondary | ICD-10-CM

## 2023-11-16 DIAGNOSIS — F1021 Alcohol dependence, in remission: Secondary | ICD-10-CM | POA: Diagnosis present

## 2023-11-16 DIAGNOSIS — D696 Thrombocytopenia, unspecified: Secondary | ICD-10-CM | POA: Diagnosis present

## 2023-11-16 DIAGNOSIS — F1220 Cannabis dependence, uncomplicated: Secondary | ICD-10-CM | POA: Diagnosis present

## 2023-11-16 DIAGNOSIS — R64 Cachexia: Secondary | ICD-10-CM | POA: Diagnosis present

## 2023-11-16 DIAGNOSIS — Z8659 Personal history of other mental and behavioral disorders: Secondary | ICD-10-CM

## 2023-11-16 DIAGNOSIS — F1420 Cocaine dependence, uncomplicated: Secondary | ICD-10-CM | POA: Diagnosis present

## 2023-11-16 DIAGNOSIS — J9601 Acute respiratory failure with hypoxia: Secondary | ICD-10-CM | POA: Diagnosis present

## 2023-11-16 DIAGNOSIS — I132 Hypertensive heart and chronic kidney disease with heart failure and with stage 5 chronic kidney disease, or end stage renal disease: Secondary | ICD-10-CM | POA: Diagnosis present

## 2023-11-16 DIAGNOSIS — J449 Chronic obstructive pulmonary disease, unspecified: Secondary | ICD-10-CM | POA: Diagnosis present

## 2023-11-16 DIAGNOSIS — I5043 Acute on chronic combined systolic (congestive) and diastolic (congestive) heart failure: Secondary | ICD-10-CM | POA: Diagnosis present

## 2023-11-16 DIAGNOSIS — Z8616 Personal history of COVID-19: Secondary | ICD-10-CM | POA: Diagnosis not present

## 2023-11-16 DIAGNOSIS — I2721 Secondary pulmonary arterial hypertension: Secondary | ICD-10-CM | POA: Diagnosis present

## 2023-11-16 DIAGNOSIS — I3481 Nonrheumatic mitral (valve) annulus calcification: Secondary | ICD-10-CM | POA: Diagnosis present

## 2023-11-16 LAB — I-STAT VENOUS BLOOD GAS, ED
Acid-Base Excess: 2 mmol/L (ref 0.0–2.0)
Bicarbonate: 26.7 mmol/L (ref 20.0–28.0)
Calcium, Ion: 1.08 mmol/L — ABNORMAL LOW (ref 1.15–1.40)
HCT: 42 % (ref 39.0–52.0)
Hemoglobin: 14.3 g/dL (ref 13.0–17.0)
O2 Saturation: 94 %
Potassium: 3.7 mmol/L (ref 3.5–5.1)
Sodium: 134 mmol/L — ABNORMAL LOW (ref 135–145)
TCO2: 28 mmol/L (ref 22–32)
pCO2, Ven: 39.2 mmHg — ABNORMAL LOW (ref 44–60)
pH, Ven: 7.442 — ABNORMAL HIGH (ref 7.25–7.43)
pO2, Ven: 69 mmHg — ABNORMAL HIGH (ref 32–45)

## 2023-11-16 LAB — COMPREHENSIVE METABOLIC PANEL WITH GFR
ALT: 11 U/L (ref 0–44)
AST: 21 U/L (ref 15–41)
Albumin: 3.1 g/dL — ABNORMAL LOW (ref 3.5–5.0)
Alkaline Phosphatase: 80 U/L (ref 38–126)
Anion gap: 13 (ref 5–15)
BUN: 34 mg/dL — ABNORMAL HIGH (ref 8–23)
CO2: 24 mmol/L (ref 22–32)
Calcium: 8.9 mg/dL (ref 8.9–10.3)
Chloride: 97 mmol/L — ABNORMAL LOW (ref 98–111)
Creatinine, Ser: 7.16 mg/dL — ABNORMAL HIGH (ref 0.61–1.24)
GFR, Estimated: 8 mL/min — ABNORMAL LOW (ref >60)
Glucose, Bld: 108 mg/dL — ABNORMAL HIGH (ref 70–99)
Potassium: 3.9 mmol/L (ref 3.5–5.1)
Sodium: 134 mmol/L — ABNORMAL LOW (ref 135–145)
Total Bilirubin: 0.6 mg/dL (ref 0.0–1.2)
Total Protein: 6.9 g/dL (ref 6.5–8.1)

## 2023-11-16 LAB — CBC WITH DIFFERENTIAL/PLATELET
Abs Immature Granulocytes: 0.03 10*3/uL (ref 0.00–0.07)
Basophils Absolute: 0 10*3/uL (ref 0.0–0.1)
Basophils Relative: 1 %
Eosinophils Absolute: 0.2 10*3/uL (ref 0.0–0.5)
Eosinophils Relative: 3 %
HCT: 39.5 % (ref 39.0–52.0)
Hemoglobin: 12.7 g/dL — ABNORMAL LOW (ref 13.0–17.0)
Immature Granulocytes: 1 %
Lymphocytes Relative: 20 %
Lymphs Abs: 1.3 10*3/uL (ref 0.7–4.0)
MCH: 27.5 pg (ref 26.0–34.0)
MCHC: 32.2 g/dL (ref 30.0–36.0)
MCV: 85.7 fL (ref 80.0–100.0)
Monocytes Absolute: 0.5 10*3/uL (ref 0.1–1.0)
Monocytes Relative: 8 %
Neutro Abs: 4.5 10*3/uL (ref 1.7–7.7)
Neutrophils Relative %: 67 %
Platelets: 107 10*3/uL — ABNORMAL LOW (ref 150–400)
RBC: 4.61 MIL/uL (ref 4.22–5.81)
RDW: 17.9 % — ABNORMAL HIGH (ref 11.5–15.5)
WBC: 6.6 10*3/uL (ref 4.0–10.5)
nRBC: 0 % (ref 0.0–0.2)

## 2023-11-16 LAB — TROPONIN I (HIGH SENSITIVITY): Troponin I (High Sensitivity): 76 ng/L — ABNORMAL HIGH (ref <18)

## 2023-11-16 LAB — PHOSPHORUS: Phosphorus: 3.9 mg/dL (ref 2.5–4.6)

## 2023-11-16 LAB — PROCALCITONIN: Procalcitonin: 0.23 ng/mL

## 2023-11-16 LAB — MAGNESIUM: Magnesium: 2.4 mg/dL (ref 1.7–2.4)

## 2023-11-16 LAB — BRAIN NATRIURETIC PEPTIDE: B Natriuretic Peptide: >4500 pg/mL — ABNORMAL HIGH (ref 0.0–100.0)

## 2023-11-16 MED ORDER — FUROSEMIDE 10 MG/ML IJ SOLN
40.0000 mg | Freq: Two times a day (BID) | INTRAMUSCULAR | Status: DC
  Administered 2023-11-16 – 2023-11-19 (×7): 40 mg via INTRAVENOUS
  Filled 2023-11-16 (×7): qty 4

## 2023-11-16 MED ORDER — ONDANSETRON HCL 4 MG/2ML IJ SOLN
4.0000 mg | Freq: Four times a day (QID) | INTRAMUSCULAR | Status: DC | PRN
  Administered 2023-11-18: 4 mg via INTRAVENOUS
  Filled 2023-11-16: qty 2

## 2023-11-16 MED ORDER — MELATONIN 3 MG PO TABS
3.0000 mg | ORAL_TABLET | Freq: Every evening | ORAL | Status: DC | PRN
  Administered 2023-11-17 – 2023-11-18 (×2): 3 mg via ORAL
  Filled 2023-11-16 (×2): qty 1

## 2023-11-16 MED ORDER — ACETAMINOPHEN 325 MG PO TABS: 650.0000 mg | ORAL_TABLET | Freq: Four times a day (QID) | ORAL | Status: DC | PRN

## 2023-11-16 MED ORDER — CALCITRIOL 0.25 MCG PO CAPS
2.0000 ug | ORAL_CAPSULE | ORAL | Status: DC
  Administered 2023-11-19: 2 ug via ORAL
  Filled 2023-11-16: qty 8

## 2023-11-16 MED ORDER — ACETAMINOPHEN 650 MG RE SUPP
650.0000 mg | Freq: Four times a day (QID) | RECTAL | Status: DC | PRN
Start: 2023-11-16 — End: 2023-11-19

## 2023-11-16 MED ORDER — FUROSEMIDE 10 MG/ML IJ SOLN
40.0000 mg | Freq: Once | INTRAMUSCULAR | Status: AC
Start: 2023-11-16 — End: 2023-11-16
  Administered 2023-11-16: 40 mg via INTRAVENOUS
  Filled 2023-11-16: qty 4

## 2023-11-16 MED ORDER — PROSOURCE PLUS PO LIQD
30.0000 mL | Freq: Two times a day (BID) | ORAL | Status: DC
  Filled 2023-11-16 (×2): qty 30

## 2023-11-16 NOTE — ED Notes (Signed)
 IVC documents placed with the [facility].  [employer] operational officer has been made aware  of patient's new room assignment and IVC documents.   The IVC case number has been updated in EPIC and documented in the [facility] notebook in the [place] Zone.  The case number is [id number]

## 2023-11-16 NOTE — ED Notes (Signed)
 IVC documents received from [facility].   The patient was IVC'd on 11/15/2023-Documents included was the findings and custody order, affidavit and petition and the first exam. IVC documents are in the green zone on the purple clipboard at the [employer].  IVC documents are being e-filed to obtain envelope and case number.

## 2023-11-16 NOTE — ED Notes (Signed)
 Pt is on monitor. CCMD notified.

## 2023-11-16 NOTE — ED Notes (Signed)
 Copy of IVC documents placed in the red folder  located in the Purple Zone with case number [id number] added to document.

## 2023-11-16 NOTE — Progress Notes (Signed)
 [facility] Progress Note   Subjective:   Seen in ED - has been admitted. S/p HD last night - says breathing is much better and that his mood is better too. Using O2 now - doesn't use at home. Sitter at bedside.  Objective Vitals:   11/16/23 0803 11/16/23 0900 11/16/23 1000 11/16/23 1030  BP: (!) 144/87 139/73 129/84 (!) 143/82  Pulse: 90 90 82 88  Resp: 20 13 15 16   Temp: 98.5 F (36.9 C)     TempSrc: Oral     SpO2: 99% 100% 100% 100%  Weight:      Height:       Physical Exam General: Frail appearing man, NAD. [place] O2 in place - he doesn't think he needs anymore Heart: RRR; 2/6 murmur Lungs: bibasilar rales present Abdomen: soft Extremities: 2+ BLE edema to knees Dialysis Access: RUE AVG + L femoral TDC  Additional Objective Labs: Basic Metabolic Panel: Recent Labs  Lab 11/15/23 0318 11/15/23 1130 11/16/23 0301 11/16/23 0424  NA 137 135 134* 134*  K 3.7 4.6 3.7 3.9  CL 97* 98  --  97*  CO2 22 20*  --  24  GLUCOSE 140* 151*  --  108*  BUN 46* 50*  --  34*  CREATININE 10.03* 10.20*  --  7.16*  CALCIUM 9.5 9.5  --  8.9  PHOS  --   --   --  3.9   Liver Function Tests: Recent Labs  Lab 11/15/23 0318 11/15/23 1130 11/16/23 0424  AST 21 21 21   ALT 10 10 11   ALKPHOS 122 114 80  BILITOT 0.9 0.8 0.6  PROT 7.4 7.2 6.9  ALBUMIN 3.3* 3.4* 3.1*   Recent Labs  Lab 11/15/23 0318  LIPASE 30   CBC: Recent Labs  Lab 11/15/23 0318 11/15/23 1130 11/16/23 0301 11/16/23 0424  WBC 5.6 5.8  --  6.6  NEUTROABS  --  3.5  --  4.5  HGB 12.1* 12.5* 14.3 12.7*  HCT 37.7* 39.1 42.0 39.5  MCV 86.3 85.9  --  85.7  PLT 111* 143*  --  107*   Studies/Results: DG Chest 2 View Result Date: 11/16/2023 CLINICAL DATA:  Shortness of breath. EXAM: CHEST - 2 VIEW COMPARISON:  November 15, 2023 FINDINGS: The cardiac silhouette is enlarged and unchanged in size. Moderate to marked severity calcification of the aortic arch is seen. There is mild prominence of the central  pulmonary vasculature. No focal consolidation, pleural effusion or pneumothorax is seen. Multilevel degenerative changes are noted throughout the thoracic spine. IMPRESSION: Cardiomegaly with mild central pulmonary vascular congestion. Electronically Signed   By: Aram Candela M.D.   On: 11/16/2023 02:25   DG CHEST PORT 1 VIEW Result Date: 11/15/2023 CLINICAL DATA:  Shortness of breath.  End-stage renal disease. EXAM: PORTABLE CHEST 1 VIEW COMPARISON:  Two-view chest x-ray 08/30/2023 FINDINGS: The heart is enlarged. Atherosclerotic changes are present the aortic arch. Mild interstitial edema is present. Left basilar airspace disease likely reflects atelectasis. Degenerative changes are present at both shoulders, stable. IMPRESSION: 1. Cardiomegaly and mild interstitial edema compatible with congestive heart failure. 2. Left basilar airspace disease likely reflects atelectasis. Electronically Signed   By: Marin Roberts M.D.   On: 11/15/2023 18:17   CT Head Wo Contrast Result Date: 11/15/2023 CLINICAL DATA:  Altered mental status. EXAM: CT HEAD WITHOUT CONTRAST TECHNIQUE: Contiguous axial images were obtained from the base of the skull through the vertex without intravenous contrast. RADIATION DOSE REDUCTION: This exam  was performed according to the departmental dose-optimization program which includes automated exposure control, adjustment of the mA and/or kV according to patient size and/or use of iterative reconstruction technique. COMPARISON:  06/10/2023. FINDINGS: Brain: No acute intracranial hemorrhage, midline shift or mass effect is seen. No extra-axial fluid collection. Mild diffuse atrophy is noted. Periventricular white matter hypodensities are present bilaterally. No hydrocephalus. Vascular: No hyperdense vessel or unexpected calcification. Skull: Normal. Negative for fracture or focal lesion. Sinuses/Orbits: Round opacities are noted in the right sphenoid sinus and left ethmoid sinus. No  acute orbital abnormality. 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 No acute abnormality is seen. IMPRESSION: 1. No acute intracranial process. 2. Atrophy with chronic microvascular ischemic changes. Electronically Signed   By: Thornell Sartorius M.D.   On: 11/15/2023 14:13   Medications:   amLODipine  10 mg Oral Daily   apixaban  5 mg Oral BID   arformoterol  15 mcg Nebulization BID   And   umeclidinium bromide  1 puff Inhalation Daily   carvedilol  12.5 mg Oral BID WC   Chlorhexidine Gluconate Cloth  6 each Topical Q0600   escitalopram  20 mg Oral QHS   furosemide  40 mg Intravenous BID   hydrALAZINE  25 mg Oral BID   isosorbide mononitrate  30 mg Oral Daily   mirtazapine  30 mg Oral QHS   rosuvastatin  10 mg Oral Daily   sevelamer carbonate  1,600 mg Oral TID WC   tamsulosin  0.4 mg Oral QHS    Dialysis Orders TTS - NW 4hr, 400/A1.5, EDW 64kg, 2K/2Ca, TDC + RUE AVG, heparin 2500units q HD - no ESA, Hgb typically >11 - Calcitriol PO q HD  Assessment/Plan: IVC/suicidal ideation: Sitter in place, he reports feels better now. Acute Hypoxic Resp Failure/pulm edema: S/p HD overnight. Still with rales and edema, UF as tolerated. ESRD: Resume usual TTS schedule -> for HD tomorrow. Will start AVG cannulation next HD. HTN/volume: BP decent, lots of edema. UF as tolerated, will need lower EDW on discharge. Anemia of ESRD: Hgb > 12, no ESA needed Secondary HPTH: Ca/Phos ok - continue sevelamer as binder, resume VDRA. Nutrition: Alb low, adding supps. 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

## 2023-11-16 NOTE — ED Notes (Signed)
 IVC documents have been uploaded and e-filed.  Envelope Number: [id number]

## 2023-11-16 NOTE — ED Provider Notes (Signed)
 While waiting in the ER, patient appears to have episodes of apnea while sleeping.  This is likely a chronic issue.  He is currently on around 3-4 L nasal cannula, and is easily arousable but falls right back to sleep   Zadie Rhine, MD 11/16/23 [phone]

## 2023-11-16 NOTE — ED Notes (Signed)
 Pt returned from dialysis.  Scheduled for 3.5 hour treatment. Removed from treatment at 3 hours and 5 minutes d/t cramping.  Pt had 2 liters removed.  Pt is on 4L [place].  Dialysis RN reports pt did eat but is requesting more food on arrival to the ED.  Pt is alert and oriented and answers questions appropriately on arrival back to the ED.

## 2023-11-16 NOTE — ED Notes (Signed)
 Patients belongings placed in locker#1 in ED purple zone.

## 2023-11-16 NOTE — Progress Notes (Signed)
 PROGRESS NOTE   Ronald Reeves  ZOX:[id number]    DOB: Dec 10, 1955    DOA: 11/15/2023  PCP: Benjiman Core, MD   I have briefly reviewed patients previous medical records in [facility].  Chief Complaint  Patient presents with   Aggressive Behavior   IVC    Brief Hospital Course:  68 year old male with medical history significant for ESRD on MWF HD, ongoing tobacco use, COPD,??  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  Per EDP note, patient refusing dialysis and threatening to kill himself as per family.  Received multiple doses of Versed, Geodon and Benadryl.  Psychiatry consulted and recommended inpatient psychiatry admission under IVC pending medical stabilization.  Nephrology consulted, underwent dialysis on/6.   Assessment & Plan:  Principal Problem:   Acute on chronic systolic (congestive) heart failure (HCC) Active Problems:   Essential hypertension   SOB (shortness of breath)   HLD (hyperlipidemia)   ESRD (end stage renal disease) (HCC)   Acute hypoxic respiratory failure (HCC)   Suicidal ideation   History of depression   Paroxysmal atrial fibrillation (HCC)   Acute respiratory failure with hypoxia Secondary to decompensated CHF in the context of missing/incomplete hemodialysis Continue oxygen support Not on home oxygen.  Wean off oxygen as tolerated.  Will need to get home O2 eval prior to  discharge. Tachypneic with RR between 23-26/min and oxygen saturations as low as 86-87%.  Acute on chronic bind systolic and diastolic CHF 2D echo 09/04/2023: LVEF 20-25%, LV global hypokinesis, grade 3 diastolic dysfunction.  Severe pulmonary artery hypertension. Volume management across dialysis. Received dialysis overnight 4/6 but may need serial dialysis for volume management.  Nephrology to follow Also on IV Lasix, makes urine.  Defer management of Lasix to nephrology.  ESRD on TTS HD As per nephrology input, patient has missed several HD sessions. Nephrology consulted for dialysis needs. Per nephrology, patient has new RUE AVG that was planned to be used starting this week  Acute toxic metabolic encephalopathy Multifactorial Secondary to substance abuse complicating uremia from incomplete/missed dialysis and acute respiratory failure with hypoxia. Currently may be under the effect of sedative meds Treat the underlying conditions and monitor. CT head without acute findings.  Suicidal/homicidal ideations Presented to ED with agitation, threats to hurt somebody and family report of patient wanting to kill himself by missing dialysis. Psychiatry consulted and recommended inpatient psychiatry admission under IVC pending medical stabilization. Patient cannot sign out AMA  Polysubstance abuse (tobacco, cocaine, THC) Cessation counseled. Check UDS. Patient declines nicotine patch  Essential hypertension Mildly uncontrolled Volume management across dialysis Continue amlodipine, carvedilol, hydralazine and Imdur  Dyslipidemia Continue statins  BPH Continue tamsulosin  COPD Does have some wheezing which may be either due to pulmonary edema versus mild COPD flare Needs to be reassessed after dialysis Continue breathing treatments as below. Flutter valve if cooperates.  Anxiety/depression Management per psychiatry  Thrombocytopenia Chronic and intermittent Follow CBCs  periodically.  DVT/?  Paroxysmal A-fib Will need  to verify if he truly has A-fib, noted in H&P. Continue Eliquis anticoagulation.  Medical noncompliance History of missing multiple dialysis Overall failure to thrive.  Body mass index is 19.68 kg/m./Underweight    DVT prophylaxis: SCDs Start: 11/16/23 0416     Code Status: Full Code:  Family Communication: None at bedside Disposition:  Status is: Inpatient Remains inpatient appropriate because: Remains volume overloaded with need for repeat hemodialysis, medical stabilization prior to discharge to inpatient psychiatry.     Consultants:   Nephrology Psychiatry  Procedures:   Hemodialysis  Antimicrobials:   None   Subjective:  Seen this morning while still in ED.  Patient somnolent apparent probably due to the IM Geodon 20 mg that he got yesterday morning) but arousable and answers questions appropriately and 1 or 2 words.  States that his breathing is "so-so" and does report shortness of breath on and off.  Denies chest pain.  Indicates that he did cocaine and marijuana before he came to the hospital.  Volunteers to smoking half a pack of cigarette per day but declines nicotine patch.  Denies being on home oxygen or CPAP.  Confirms that he undergoes dialysis TTS with [facility].  He denies suicidal or homicidal ideations.  Objective:   Vitals:   11/16/23 0645 11/16/23 0700 11/16/23 0730 11/16/23 0803  BP: 136/83 (!) 145/84 (!) 141/84 (!) 144/87  Pulse: 92 90 (!) 103 90  Resp: 11 (!) 24 (!) 26 20  Temp:   98.7 F (37.1 C) 98.5 F (36.9 C)  TempSrc:   Oral Oral  SpO2: (!) 86% (!) 87% 99% 99%  Weight:      Height:        General exam: Middle-age male, looks older than stated age, small built, frail, cachectic, chronically ill looking sitting up in bed almost upright, intermittently fidgeting in bed.  No obvious distress. Respiratory system: Scattered bilateral wheezing both anteriorly and posteriorly.   Diminished breath sounds in the bases with occasional basal crackles.  No increased work of breathing. Cardiovascular system: S1 & S2 heard, RRR. No JVD, murmurs, rubs, gallops or clicks.  1+ pitting lower extremity edema up to the knees.  Right more than the left.  Telemetry personally reviewed at bedside: Sinus rhythm. Gastrointestinal system: Abdomen is nondistended, soft and nontender. No organomegaly or masses felt. Normal bowel sounds heard. Central nervous system: Sona but easily arousable and oriented x 2.  Answer simple questions and follow simple instructions. No focal neurological deficits. Extremities: Symmetric 5 x 5 power.  Right upper thigh HD catheter. Skin: No rashes, lesions or ulcers Psychiatry: Judgement and insight impaired. Mood & affect cannot be assessed.    Data Reviewed:   I have personally reviewed following labs and imaging studies   CBC: Recent Labs  Lab 11/15/23 0318 11/15/23 1130 11/16/23 0301 11/16/23 0424  WBC 5.6 5.8  --  6.6  NEUTROABS  --  3.5  --  4.5  HGB 12.1* 12.5* 14.3 12.7*  HCT 37.7* 39.1 42.0 39.5  MCV 86.3 85.9  --  85.7  PLT 111* 143*  --  107*    Basic Metabolic Panel: Recent Labs  Lab 11/15/23 0318 11/15/23 1130 11/16/23 0301 11/16/23 0424  NA 137 135 134* 134*  K 3.7 4.6 3.7 3.9  CL 97* 98  --  97*  CO2 22 20*  --  24  GLUCOSE 140* 151*  --  108*  BUN 46* 50*  --  34*  CREATININE 10.03* 10.20*  --  7.16*  CALCIUM 9.5 9.5  --  8.9  MG  --   --   --  2.4  PHOS  --   --   --  3.9    Liver Function Tests: Recent Labs  Lab 11/15/23 0318 11/15/23 1130 11/16/23 0424  AST 21 21 21   ALT 10 10 11   ALKPHOS 122 114 80  BILITOT 0.9 0.8 0.6  PROT 7.4 7.2 6.9  ALBUMIN 3.3* 3.4* 3.1*    CBG: No results for input(s): "GLUCAP" in the last 168 hours.  Microbiology Studies:  No results found for this or any previous visit (from the past 240 hours).  Radiology Studies:  DG Chest 2 View Result Date: 11/16/2023 CLINICAL  DATA:  Shortness of breath. EXAM: CHEST - 2 VIEW COMPARISON:  November 15, 2023 FINDINGS: The cardiac silhouette is enlarged and unchanged in size. Moderate to marked severity calcification of the aortic arch is seen. There is mild prominence of the central pulmonary vasculature. No focal consolidation, pleural effusion or pneumothorax is seen. Multilevel degenerative changes are noted throughout the thoracic spine. IMPRESSION: Cardiomegaly with mild central pulmonary vascular congestion. Electronically Signed   By: Aram Candela M.D.   On: 11/16/2023 02:25   DG CHEST PORT 1 VIEW Result Date: 11/15/2023 CLINICAL DATA:  Shortness of breath.  End-stage renal disease. EXAM: PORTABLE CHEST 1 VIEW COMPARISON:  Two-view chest x-ray 08/30/2023 FINDINGS: The heart is enlarged. Atherosclerotic changes are present the aortic arch. Mild interstitial edema is present. Left basilar airspace disease likely reflects atelectasis. Degenerative changes are present at both shoulders, stable. IMPRESSION: 1. Cardiomegaly and mild interstitial edema compatible with congestive heart failure. 2. Left basilar airspace disease likely reflects atelectasis. Electronically Signed   By: Marin Roberts M.D.   On: 11/15/2023 18:17   CT Head Wo Contrast Result Date: 11/15/2023 CLINICAL DATA:  Altered mental status. EXAM: CT HEAD WITHOUT CONTRAST TECHNIQUE: Contiguous axial images were obtained from the base of the skull through the vertex without intravenous contrast. RADIATION DOSE REDUCTION: This exam was performed according to the departmental dose-optimization program which includes automated exposure control, adjustment of the mA and/or kV according to patient size and/or use of iterative reconstruction technique. COMPARISON:  06/10/2023. FINDINGS: Brain: No acute intracranial hemorrhage, midline shift or mass effect is seen. No extra-axial fluid collection. Mild diffuse atrophy is noted. Periventricular white matter hypodensities  are present bilaterally. No hydrocephalus. Vascular: No hyperdense vessel or unexpected calcification. Skull: Normal. Negative for fracture or focal lesion. Sinuses/Orbits: Round opacities are noted in the right sphenoid sinus and left ethmoid sinus. No acute orbital abnormality. 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 No acute abnormality is seen. IMPRESSION: 1. No acute intracranial process. 2. Atrophy with chronic microvascular ischemic changes. Electronically Signed   By: Thornell Sartorius M.D.   On: 11/15/2023 14:13    Scheduled Meds:    amLODipine  10 mg Oral Daily   apixaban  5 mg Oral BID   arformoterol  15 mcg Nebulization BID   And   umeclidinium bromide  1 puff Inhalation Daily   carvedilol  12.5 mg Oral BID WC   Chlorhexidine Gluconate Cloth  6 each Topical Q0600   escitalopram  20 mg Oral QHS   furosemide  40 mg Intravenous BID   hydrALAZINE  25 mg Oral BID   isosorbide mononitrate  30 mg Oral Daily   mirtazapine  30 mg Oral QHS   rosuvastatin  10 mg Oral Daily  sevelamer carbonate  1,600 mg Oral TID WC   tamsulosin  0.4 mg Oral QHS    Continuous Infusions:     LOS: 0 days     Marcellus Scott, MD,  FACP, [facility], [facility], [facility]   [facility] [place]      To contact the attending provider between 7A-7P or the covering provider during after hours 7P-7A, please log into the web site [email or web address] and access using universal [place] password for that web site. If you do not have the password, please call the hospital operator.  11/16/2023, 8:39 AM

## 2023-11-16 NOTE — ED Provider Notes (Signed)
 I assumed care of patient after he returned from dialysis.  Patient has a very complex medical history including end-stage renal disease, substance use disorder, hypertension, CAD, previous CVA, VTE.  Patient was seen in the ER yesterday for suicidal ideation and was placed under involuntary commitment.  He was found to have needed dialysis and went for dialysis upstairs.  He return to the ER, but was still oxygen dependent which appears to be new.  He also has continued vascular congestion on his x-ray.  Patient does produce urine, therefore IV Lasix has been ordered Patient is also very drowsy but will workup the pain.  Labs did not reveal any signs of hypercapnia I have reviewed previous records including Care Everywhere  When I reviewed his behavioral health notes, patient has been recommended for inpatient behavioral health admission.  However at this time he is not medically stable to be transferred anywhere for behavioral health Due to his new oxygen requirement, he will be admitted and diuresed.  He may also need another session of dialysis  Discussed the case with Dr. Arlean Hopping with [facility] for admission    EKG Interpretation Date/Time:  Sunday November 15 2023 12:06:30 EDT Ventricular Rate:  82 PR Interval:  231 QRS Duration:  108 QT Interval:  411 QTC Calculation: 480 R Axis:   -32  Text Interpretation: Sinus rhythm Prolonged PR interval Probable left atrial enlargement LVH with secondary repolarization abnormality Anterior infarct, old st depression in inferior and lateral leads seen on prior No significant change since last tracing Confirmed by Melene Plan [phone]) on 11/15/2023 12:35:32 PM         .Critical Care  Performed by: Zadie Rhine, MD Authorized by: Zadie Rhine, MD   Critical care provider statement:    Critical care time (minutes):  60   Critical care start time:  11/16/2023 3:00 AM   Critical care end time:  11/16/2023 4:00 AM   Critical care was necessary to  treat or prevent imminent or life-threatening deterioration of the following conditions:  Respiratory failure and renal failure   Critical care was time spent personally by me on the following activities:  Examination of patient, re-evaluation of patient's condition, pulse oximetry, ordering and review of laboratory studies, ordering and performing treatments and interventions and discussions with consultants   I assumed direction of critical care for this patient from another provider in my specialty: yes     Care discussed with: admitting provider       Zadie Rhine, MD 11/16/23 [phone]

## 2023-11-16 NOTE — ED Notes (Signed)
 Pt is sleeping and seemes somnolent, reacts to verbal stimuli.  Pt is on the monitor, 1:1 sitter is at the bedside.

## 2023-11-16 NOTE — H&P (Signed)
 History and Physical      Ronald Reeves ZHY:[id number] DOB: 11-21-1955 DOA: 11/15/2023; DOS: 11/16/2023  PCP: Benjiman Core, MD  Patient coming from: home   I have personally briefly reviewed patient's old medical records in [facility]  Chief Complaint: Suicidal ideations  HPI: Ronald Reeves is a 68 y.o. male with medical history significant for essential disease on urinalysis, chronic systolic/[facility], COPD, paroxysmal atrial fibrillation chronically anticoagulated on Eliquis, depression, who is admitted to [facility] on 11/15/2023 with acute on chronic systolic/diastolic heart failure, acute hypoxic respiratory failure after presenting from home to [facility] ED complaining of suicidal ideations.   The patient presented today with concerns department for evaluation of suicidal ideations in the context of a documented history of depression.  Has been endorsed worsening shortness of breath over the last few days will is worsening edema in the bilateral lower extremities.  Denies any associated chest pain.  Denies any associated subjective fever, chills and rigors, or generalized myalgias.  No known baseline supplemental oxygen requirements.  Per chart review, he has a history of chronic systolic/ischemic heart failure, with most recent echocardiogram in January 2025 showing LVEF 25%, grade 3 diastolic function, mild reduction in right ventricular systolic function.  Latest admission was recent on hemodialysis, he conveys that he does continue to produce urine and is on Lasix as an outpatient.  Additionally, per review of documentation through the [place] system, there is documentation of a history of paroxysmal atrial fibrillation for which he is currently anticoagulated on Eliquis.    ED Course:  Vital signs in the ED were notable for the following: Afebrile; rates in the 80s to 90s; systolic pressures 130s to 180s open history 1824, initial oxygen saturation in the mid 80s on  room air, Sosan proving in the range of 99 to 100% on 4 L nasal cannula.  Labs were notable for the following: CMP notable for potassium 4.6, bicarbonate 20, anion gap 17, glucose 151 and.  BNP was greater than 4500.  High sensitive troponin I was initially 73 with repeat trending up slightly to 7 5 (relative to most recent prior high sensitive troponin I value of 98 in January 2025.  CBC notable for white cell count 50 intermingling 12.5.  Per my interpretation, EKG in ED demonstrated the following: Compared to most recent prior EKG from 09/04/2023, today's EKG shows sinus rhythm with first AV block, heart rate 82, nonspecific to inversions in leads I, aVL, V5 and V6 and all of which appear unchanged from his recent EKG, showing lasted for several millimeter ST elevation in V3, also unchanged from EKG, and also showing onset of this 1 mm ST depression in V5, unchanged from most recent EKG, showing no evidence of interval T wave or ST changes compared to most recent prior EKG from 09/04/2023.  Imaging in the ED, per corresponding formal radiology read, was notable for the following: 1 view chest x-ray showed cardiomegaly with interstitial edema consistent with CHF, will demonstrate no evidence of ventricular edema, or effusion.  EDP has discussed patient's case with on-call nephrology, Dr. Chales Abrahams, who is consulting, on and arrange for overnight hemodialysis.  Additionally, psychiatry has consulted and recommend inpatient psych once the patient is medically cleared.   After HD session, patient remained short of breath, with perpetuation of the above new supplemental oxygen requirements.  While in the ED, the following were administered: Prn individual nebulizer..  Subsequently, the patient was admitted for further evaluation management of suspected  acute on chronic systolic/diastolic heart failure complicated by acute hypoxic respiratory failure after presenting with complaint of suicidal  ideations.    Review of Systems: As per HPI otherwise 10 point review of systems negative.   Past Medical History:  Diagnosis Date   Anemia    Anxiety    Arthritis    Back pain    Bronchitis    COPD (chronic obstructive pulmonary disease) (HCC)    Coronary artery disease    COVID    mild - flu like symptoms   Dyspnea    w/ exertion, uses inhaler   ESRD on hemodialysis (HCC)    dialysis on tues, thurs, sat at [facility]   GERD (gastroesophageal reflux disease)    not a current problem   Heart murmur    never has caused any problems   Hypertension    Myocardial infarction (HCC)    Pneumonia    x 1   Pre-diabetes    diet controlled, no meds, does not check blood sugar   Substance abuse [facility])     Past Surgical History:  Procedure Laterality Date   A/V FISTULAGRAM Right 06/17/2023   Procedure: A/V Fistulagram;  Surgeon: Victorino Sparrow, MD;  Location: [facility] INVASIVE CV LAB;  Service: Cardiovascular;  Laterality: Right;   A/V FISTULAGRAM Right 07/17/2023   Procedure: A/V Fistulagram;  Surgeon: [name], MD;  Location: [facility] INVASIVE CV LAB;  Service: Cardiovascular;  Laterality: Right;   AV FISTULA PLACEMENT Left 04/12/2021   Procedure: LEFT ARM ARTERIOVENOUS (AV) FISTULA CREATION;  Surgeon: Cephus Shelling, MD;  Location: [facility] OR;  Service: Vascular;  Laterality: Left;   AV FISTULA PLACEMENT Left 10/18/2021   Procedure: LEFT ARM BRACHIOBASILIC FISTULA CREATION FIRST STAGE;  Surgeon: Cephus Shelling, MD;  Location: [facility] OR;  Service: Vascular;  Laterality: Left;   AV FISTULA PLACEMENT Right 11/28/2022   Procedure: RIGHT ARM FIRST STAGE BRACHIOBASILIC FISTULA CREATION;  Surgeon: Cephus Shelling, MD;  Location: [facility] OR;  Service: Vascular;  Laterality: Right;   AV FISTULA PLACEMENT Right 10/19/2023   Procedure: RIGHT ARM ARTERIOVENOUS GRAFT CREATION;  Surgeon: [name], MD;  Location: [facility] OR;  Service: Vascular;  Laterality: Right;   BACK SURGERY     BASCILIC VEIN  TRANSPOSITION Left 01/27/2022   Procedure: LEFT SECOND STAGE BASILIC VEIN TRANSPOSITION;  Surgeon: Cephus Shelling, MD;  Location: [facility] OR;  Service: Vascular;  Laterality: Left;   BASCILIC VEIN TRANSPOSITION Right 02/18/2023   Procedure: SECOND STAGE RIGHT ARM BASILIC VEIN TRANSPOSITION;  Surgeon: Cephus Shelling, MD;  Location: [facility] OR;  Service: Vascular;  Laterality: Right;   BIOPSY  08/20/2022   Procedure: BIOPSY;  Surgeon: Shellia Cleverly, DO;  Location: [facility];  Service: Gastroenterology;;   COLONOSCOPY WITH PROPOFOL N/A 08/20/2022   Procedure: COLONOSCOPY WITH PROPOFOL;  Surgeon: Shellia Cleverly, DO;  Location: [facility];  Service: Gastroenterology;  Laterality: N/A;   DIALYSIS/PERMA CATHETER INSERTION Right 03/05/2023   Procedure: DIALYSIS/PERMA CATHETER INSERTION;  Surgeon: Cephus Shelling, MD;  Location: [facility] INVASIVE CV LAB;  Service: Cardiovascular;  Laterality: Right;   ESOPHAGOGASTRODUODENOSCOPY (EGD) WITH PROPOFOL N/A 08/20/2022   Procedure: ESOPHAGOGASTRODUODENOSCOPY (EGD) WITH PROPOFOL;  Surgeon: Shellia Cleverly, DO;  Location: [facility];  Service: Gastroenterology;  Laterality: N/A;   INSERTION OF DIALYSIS CATHETER Right 05/01/2022   Procedure: INSERTION OF DIALYSIS CATHETER;  Surgeon: [name], MD;  Location: [facility];  Service: Vascular;  Laterality: Right;   INSERTION OF DIALYSIS CATHETER  Right 07/10/2023   Procedure: ULTRASOUNDED GUIDED INSERTION OF TUNNELED  DIALYSIS CATHETER TO RIGHT FEMORAL VEIN;  Surgeon: Cephus Shelling, MD;  Location: [facility];  Service: Vascular;  Laterality: Right;   IR REMOVAL TUN CV CATH W/O FL  12/19/2022   LIGATION OF COMPETING BRANCHES OF ARTERIOVENOUS FISTULA Left 07/15/2021   Procedure: LIGATION OF COMPETING BRANCHES OF LEFT RADIOCEPHALIC ARTERIOVENOUS FISTULA TIMES TWO;  Surgeon: Cephus Shelling, MD;  Location: [facility];  Service: Vascular;  Laterality: Left;   PERIPHERAL VASCULAR BALLOON ANGIOPLASTY  07/17/2023    Procedure: PERIPHERAL VASCULAR BALLOON ANGIOPLASTY;  Surgeon: [name], MD;  [facility]: [facility] INVASIVE CV LAB;  Service: Cardiovascular;;   PERIPHERAL VASCULAR INTERVENTION  06/17/2023   Procedure: PERIPHERAL VASCULAR INTERVENTION;  Surgeon: Victorino Sparrow, MD;  Location: [facility] INVASIVE CV LAB;  Service: Cardiovascular;;   POLYPECTOMY  08/20/2022   Procedure: POLYPECTOMY;  Surgeon: Shellia Cleverly, DO;  Location: [facility];  Service: Gastroenterology;;   REVISON OF ARTERIOVENOUS FISTULA Left 07/15/2021   Procedure: REVISON OF LEFT ARTERIOVENOUS FISTULA;  Surgeon: Cephus Shelling, MD;  Location: [facility] OR;  Service: Vascular;  Laterality: Left;   REVISON OF ARTERIOVENOUS FISTULA Left 05/01/2022   Procedure: ARTERIOVENOUS FISTULA WASHOUT OF ARM HEMATOMA;  Surgeon: [name], MD;  Location: [facility] OR;  Service: Vascular;  Laterality: Left;   TUNNELLED CATHETER EXCHANGE N/A 10/23/2023   Procedure: TUNNELLED CATHETER EXCHANGE;  Surgeon: Tyler Pita, MD;  Location: [facility] INVASIVE CV LAB;  Service: Cardiovascular;  Laterality: N/A;   UPPER EXTREMITY VENOGRAPHY Bilateral 08/28/2023   Procedure: UPPER EXTREMITY VENOGRAPHY;  Surgeon: [name], MD;  Location: [facility] INVASIVE CV LAB;  Service: Cardiovascular;  Laterality: Bilateral;    Social History:  reports that he has been smoking cigars and cigarettes. He has been exposed to tobacco smoke. He has never used smokeless tobacco. He reports that he does not currently use alcohol after a past usage of about 14.0 standard drinks of alcohol per week. He reports current drug use. Frequency: 1.00 time per week. Drugs: Marijuana and Cocaine.   Allergies  Allergen Reactions   Enalapril Other (See Comments)    Renal failure syndrome   Gabapentin     Encephalopathy, tremor   Iodinated Contrast Media Nausea And Vomiting    Family History  Problem Relation Age of Onset   Heart disease Mother    Heart attack Sister 57    Family history  reviewed and not pertinent    Prior to Admission medications   Medication Sig Start Date End Date Taking? Authorizing Provider  albuterol (VENTOLIN HFA) 108 (90 Base) MCG/ACT inhaler Inhale 2 puffs into the lungs every 6 (six) hours as needed (COPD).    [provider]  amLODipine (NORVASC) 10 MG tablet Take 1 tablet (10 mg total) by mouth daily. 01/01/18   Lewayne Bunting, MD  amoxicillin-clavulanate (AUGMENTIN) 250-125 MG tablet Take 1 tablet by mouth 2 (two) times daily. 09/01/23   [provider]  apixaban (ELIQUIS) 5 MG TABS tablet Take 1 tablet (5 mg total) by mouth 2 (two) times daily. 03/06/23   Atway, Rayann N, DO  B Complex-C-Folic Acid (DIALYVITE TABLET) TABS Take 1 tablet by mouth daily. 12/23/22   [provider]  carvedilol (COREG) 12.5 MG tablet Take 12.5 mg by mouth 2 (two) times daily with a meal.    [provider]  escitalopram (LEXAPRO) 20 MG tablet Take 20 mg by mouth at bedtime.    [provider]  ferric citrate (AURYXIA) 1 GM 210 MG(Fe) tablet Take 420 mg by mouth 3 (three) times daily with meals.    [provider]  furosemide (LASIX) 40 MG tablet Take 40 mg by mouth daily.    [provider]  hydrALAZINE (APRESOLINE) 25 MG tablet Take 25 mg by mouth 2 (two) times daily.    [provider]  isosorbide mononitrate (IMDUR) 30 MG 24 hr tablet Take 30 mg by mouth daily.    [provider]  mirtazapine (REMERON) 30 MG tablet Take 30 mg by mouth at bedtime.    [provider]  naloxone [facility]) nasal spray 4 mg/0.1 mL Place 0.4 mg into the nose See admin instructions.  SPRAY 1 SPRAY INTO ONE NOSTRIL AS DIRECTED AS NEEDED FOR RESCUE FOR OPIOID OVERDOSE - CALL 911 IMMEDIATELY, ADMINISTER DOSE, THEN TURN PERSON ON SIDE - IF NO RESPONSE IN 2-3 MINUTES OR PERSON RESPONDS BUT RELAPSES, REPEAT USING A NEW SPRAY DEVICE AND SPRAY INTO THE OTHER NOSTRIL AS NEEDED FOR RESCUE 01/21/23   [provider]  omeprazole (PRILOSEC) 40 MG capsule Take 1 capsule (40 mg total) by mouth daily. Patient taking differently: Take 40 mg by mouth daily as needed. 06/06/22   Arnaldo Natal, NP  oxyCODONE-acetaminophen (PERCOCET/ROXICET) 5-325 MG tablet Take 1 tablet by mouth every 4 (four) hours as needed for severe pain (pain score 7-10). 10/20/23   [name], MD  PSYLLIUM PO Take by mouth. 09/03/23   [provider]  rosuvastatin (CRESTOR) 10 MG tablet Take 10 mg by mouth daily.    [provider]  sevelamer carbonate (RENVELA) 800 MG tablet Take 1,600 mg by mouth 3 (three) times daily with meals. 01/06/23   [provider]  sorbitol 70 % solution Take 10 mLs by mouth 3 (three) times daily as needed (constipation). Patient not taking: Reported on 08/26/2023 01/20/23   [provider]  tamsulosin (FLOMAX) 0.4 MG CAPS capsule Take 0.4 mg by mouth at bedtime.    [provider]  Tiotropium Bromide-Olodaterol 2.5-2.5 MCG/ACT AERS Take 2 puffs by mouth in the morning. 08/07/23   [provider]     Objective    Physical Exam: Vitals:   11/15/23 2342 11/16/23 0125 11/16/23 0140 11/16/23 0200  BP: 136/89 131/77  (!) 129/95  Pulse: 93 83  84  Resp: (!) 34 (!) 24  (!) 23  Temp:  98.9 F (37.2 C)    TempSrc:  Oral    SpO2: 100% 100% 100% 100%  Weight:      Height:        General: appears to be stated age; alert Skin: warm, dry, no rash Head:  AT/[place] Mouth:  Oral mucosa membranes appear moist, normal dentition Neck: supple; trachea midline Heart:  RRR; did not appreciate any M/R/G Lungs: Diminished bibasilar breath sounds, did not appreciate any wheezes, rales, or rhonchi Abdomen: + BS; soft, ND, NT Vascular: 2+ pedal pulses b/l; 2+ radial pulses b/l Extremities: 1-2+ edema in the bilateral lower extremities no muscle wasting Neuro: strength and sensation intact in upper and lower extremities b/l      Labs on  Admission: I have personally reviewed following labs and imaging studies  CBC: Recent Labs  Lab 11/15/23 0318 11/15/23 1130 11/16/23 0301  WBC 5.6 5.8  --   NEUTROABS  --  3.5  --   HGB 12.1* 12.5* 14.3  HCT 37.7* 39.1 42.0  MCV 86.3 85.9  --  PLT 111* 143*  --    Basic Metabolic Panel: Recent Labs  Lab 11/15/23 0318 11/15/23 1130 11/16/23 0301  NA 137 135 134*  K 3.7 4.6 3.7  CL 97* 98  --   CO2 22 20*  --   GLUCOSE 140* 151*  --   BUN 46* 50*  --   CREATININE 10.03* 10.20*  --   CALCIUM 9.5 9.5  --    GFR: Estimated Creatinine Clearance: 6.4 mL/min (A) (by C-G formula based on SCr of 10.2 mg/dL (H)). Liver Function Tests: Recent Labs  Lab 11/15/23 0318 11/15/23 1130  AST 21 21  ALT 10 10  ALKPHOS 122 114  BILITOT 0.9 0.8  PROT 7.4 7.2  ALBUMIN 3.3* 3.4*   Recent Labs  Lab 11/15/23 0318  LIPASE 30   No results for input(s): "AMMONIA" in the last 168 hours. Coagulation Profile: No results for input(s): "INR", "PROTIME" in the last 168 hours. Cardiac Enzymes: No results for input(s): "CKTOTAL", "CKMB", "CKMBINDEX", "TROPONINI" in the last 168 hours. BNP (last 3 results) No results for input(s): "PROBNP" in the last 8760 hours. HbA1C: No results for input(s): "HGBA1C" in the last 72 hours. CBG: No results for input(s): "GLUCAP" in the last 168 hours. Lipid Profile: No results for input(s): "CHOL", "HDL", "LDLCALC", "TRIG", "CHOLHDL", "LDLDIRECT" in the last 72 hours. Thyroid Function Tests: No results for input(s): "TSH", "T4TOTAL", "FREET4", "T3FREE", "THYROIDAB" in the last 72 hours. Anemia Panel: No results for input(s): "VITAMINB12", "FOLATE", "FERRITIN", "TIBC", "IRON", "RETICCTPCT" in the last 72 hours. Urine analysis:    Component Value Date/Time   COLORURINE YELLOW 08/30/2023 1201   APPEARANCEUR CLEAR 08/30/2023 1201   LABSPEC 1.018 08/30/2023 1201   PHURINE 7.0 08/30/2023 1201   GLUCOSEU NEGATIVE 08/30/2023 1201   HGBUR SMALL (A)  08/30/2023 1201   BILIRUBINUR NEGATIVE 08/30/2023 1201   KETONESUR NEGATIVE 08/30/2023 1201   PROTEINUR 100 (A) 08/30/2023 1201   UROBILINOGEN 1.0 05/20/2013 0532   NITRITE NEGATIVE 08/30/2023 1201   LEUKOCYTESUR NEGATIVE 08/30/2023 1201    Radiological Exams on Admission: DG Chest 2 View Result Date: 11/16/2023 CLINICAL DATA:  Shortness of breath. EXAM: CHEST - 2 VIEW COMPARISON:  November 15, 2023 FINDINGS: The cardiac silhouette is enlarged and unchanged in size. Moderate to marked severity calcification of the aortic arch is seen. There is mild prominence of the central pulmonary vasculature. No focal consolidation, pleural effusion or pneumothorax is seen. Multilevel degenerative changes are noted throughout the thoracic spine. IMPRESSION: Cardiomegaly with mild central pulmonary vascular congestion. Electronically Signed   By: Aram Candela M.D.   On: 11/16/2023 02:25   DG CHEST PORT 1 VIEW Result Date: 11/15/2023 CLINICAL DATA:  Shortness of breath.  End-stage renal disease. EXAM: PORTABLE CHEST 1 VIEW COMPARISON:  Two-view chest x-ray 08/30/2023 FINDINGS: The heart is enlarged. Atherosclerotic changes are present the aortic arch. Mild interstitial edema is present. Left basilar airspace disease likely reflects atelectasis. Degenerative changes are present at both shoulders, stable. IMPRESSION: 1. Cardiomegaly and mild interstitial edema compatible with congestive heart failure. 2. Left basilar airspace disease likely reflects atelectasis. Electronically Signed   By: Marin Roberts M.D.   On: 11/15/2023 18:17   CT Head Wo Contrast Result Date: 11/15/2023 CLINICAL DATA:  Altered mental status. EXAM: CT HEAD WITHOUT CONTRAST TECHNIQUE: Contiguous axial images were obtained from the base of the skull through the vertex without intravenous contrast. RADIATION DOSE REDUCTION: This exam was performed according to the departmental dose-optimization program which includes automated exposure  control, adjustment of the mA and/or kV according to patient size and/or use of iterative reconstruction technique. COMPARISON:  06/10/2023. FINDINGS: Brain: No acute intracranial hemorrhage, midline shift or mass effect is seen. No extra-axial fluid collection. Mild diffuse atrophy is noted. Periventricular white matter hypodensities are present bilaterally. No hydrocephalus. Vascular: No hyperdense vessel or unexpected calcification. Skull: Normal. Negative for fracture or focal lesion. Sinuses/Orbits: Round opacities are noted in the right sphenoid sinus and left ethmoid sinus. No acute orbital abnormality. 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 No acute abnormality is seen. IMPRESSION: 1. No acute intracranial process. 2. Atrophy with chronic microvascular ischemic changes. Electronically Signed   By: Thornell Sartorius M.D.   On: 11/15/2023 14:13      Assessment/Plan   Principal Problem:   Acute on chronic systolic (congestive) heart failure (HCC) Active Problems:   Essential hypertension   SOB (shortness of breath)   HLD (hyperlipidemia)   ESRD (end stage renal disease) (HCC)   Acute hypoxic respiratory failure (HCC)   Suicidal ideation   History of depression   Paroxysmal atrial fibrillation (HCC)     #) Acute hypoxic respiratory failure due to acute on chronic systolic/diastolic heart failure: dx of acute decompensation on the basis of presenting to 2 days of shortness of breath, orthopnea, worsening of edema on bilateral lower extremities, with elevated BNP and chest x-ray showing evidence of interval development of interstitial edema consistent with CHF. This is in the context of a known history of chronic systolic/diastolic heart failure, with most recent echocardiogram performed January 2025, notable for LVEF 20 to 25%, as well as grade 3 diastolic dysfunction with additional results as conveyed above.   In terms of etiology leading to this acutely new  onset heart failure, there is suspicion for potential suboptimal compliance and attending hemodialysis sessions.  ACS appears less likely absence of any recent chest pain, while high sensor troponin I is minimally elevated, but consistent with his chronic elevation and trending down relative to most recent prior troponin values from January 2025, while presenting EKG shows no evidence of interval ischemic changes relative to most recent prior EKG from January 2025, as further detailed above.  This is also associated with acute hypoxic respiratory failure with initial oxygen saturation in the mid 80s on room air, Sosan improving into the high 90s 5% for this nasal cannula in the context of no known baseline supplemental oxygen requirements.,  No evidence of infiltrate on chest x-ray to suggest pneumonia.  While he has COPD, presentation clinically appears less suggestive of acute COPD exacerbation.  No evidence of pneumothorax on presenting plain film.  Persistence of shortness of breath and supplemental oxygen requirement after HD session today.  As the patient does continue to produce urine, he has received a dose of IV Lasix at this time,.  Will close monitor for ensuing urine output from this, and order additional IV Lasix, as indicated below.  Of note, clinically, presentation appears less suggestive of acute pulmonary embolism, which is rendered a less likely via the patient's chronic anticoagulation on Eliquis in the setting of a history of paroxysmal atrial fibrillation per documentation from the [place].  Plan: monitor strict I's & O's and daily weights. Monitor on telemetry, repeat CMP in the morning along with magnesium and phosphorus levels.  Lasix 40 mg IV twice daily.  Nephrology has been consulted,, as above close monitoring of ensuing blood pressure response to diuresis efforts, including to help guide need  for improvement in afterload reduction in order to optimize cardiac output. Trend  troponin.  Continue outpatient BB, as well as additional afterload reduction agents to include Imdur and hydralazine.  Contraband.                 #) Suicidal ideations: presented with such,.  EDP discussed patient's case with inpatient psychiatry, who have evaluated her formally consulted, and recommended inpatient psych placement once the patient is medically cleared.  The patient has been IVC'ed. this is in context of a documented history of depression, for which patient is on Lexapro as outpatient.  Plan: Suicide precautions.  Inpatient psychiatry consult, as above.  Continue home Lexapro for now, will monitor for additional psychiatry recommendations for any potential changes to pharmacologic regimen.                   #) ESRD: on HD.  Underwent hemodialysis yesterday, following nephrology consultation, as above .  May need additional hemodialysis given evidence of residual volume overload, as above .  It is noted the patient does continue to produce urine and is on Lasix as an outpatient.  Plan: monitor strict I's/O's, daily weights. CMP in the AM. Check mag and phos levels.  Nephrology has consulted and will cont  to follow.  Lasix 40 mg IV twice daily, as above.                        #) Essential Hypertension: documented h/o such, with outpatient antihypertensive regimen including Imdur, dressing, Coreg, Norvasc.  SBP's in the ED today: 130s to 180s mmHg.   Plan: Close monitoring of subsequent BP via routine VS. will resume home antihypertensive medications and attempt to optimize afterload reduction in the setting of his presenting acute on chronic systolic/diastolic heart failure.  Monitor strict I's and O's Daily weights.  IV Lasix, as above.  Nephrology consulted, as above.                      #) Paroxysmal atrial fibrillation: Documented history of such. In setting of CHA2DS2-VASc score of 3, there is an indication  for chronic anticoagulation for thromboembolic prophylaxis. Consistent with this, patient is chronically anticoagulated on Eliquis, [facility]. Home AV nodal blocking regimen: Coreg.  Most recent echocardiogram was performed in January 2025, as further detailed above. Presenting EKG sinus rhythm without overt evidence of acute ischemic changes, as further outlined above..   Plan: monitor strict I's & O's and daily weights. CMP/CBC in AM. Check serum mag level. Continue home AV nodal blocking regimen.  Continue Eliquis.  Monitor on telemetry.                   #) Hyperlipidemia: documented h/o such. On rosuvastatin as outpatient.   Plan: continue home statin.       DVT prophylaxis: SCD's + continuation of outpatient Eliquis, as above. Code Status: Full code Family Communication: none Disposition Plan: Per Rounding Team Consults called: EDP has discussed patient's case with on-call nephrology, Dr. Chales Abrahams, who is consulting, on and arrange for overnight hemodialysis.  Additionally, psychiatry has consulted and recommend inpatient psych once the patient is medically cleared. ;  Admission status: Inpatient     I SPENT GREATER THAN 75  MINUTES IN CLINICAL CARE TIME/MEDICAL DECISION-MAKING IN COMPLETING THIS ADMISSION.      Chaney Born Neeley Sedivy DO [facility]  From 7PM - 7AM   11/16/2023, 4:17 AM

## 2023-11-16 NOTE — ED Notes (Signed)
 Pt continues to have sitter at the bedside, spouse visited him

## 2023-11-17 DIAGNOSIS — I5023 Acute on chronic systolic (congestive) heart failure: Secondary | ICD-10-CM | POA: Diagnosis not present

## 2023-11-17 LAB — BASIC METABOLIC PANEL WITH GFR
Anion gap: 15 (ref 5–15)
BUN: 45 mg/dL — ABNORMAL HIGH (ref 8–23)
CO2: 22 mmol/L (ref 22–32)
Calcium: 9 mg/dL (ref 8.9–10.3)
Chloride: 97 mmol/L — ABNORMAL LOW (ref 98–111)
Creatinine, Ser: 8.45 mg/dL — ABNORMAL HIGH (ref 0.61–1.24)
GFR, Estimated: 6 mL/min — ABNORMAL LOW (ref >60)
Glucose, Bld: 122 mg/dL — ABNORMAL HIGH (ref 70–99)
Potassium: 3.8 mmol/L (ref 3.5–5.1)
Sodium: 134 mmol/L — ABNORMAL LOW (ref 135–145)

## 2023-11-17 LAB — CBC
HCT: 36.8 % — ABNORMAL LOW (ref 39.0–52.0)
Hemoglobin: 12.1 g/dL — ABNORMAL LOW (ref 13.0–17.0)
MCH: 27.3 pg (ref 26.0–34.0)
MCHC: 32.9 g/dL (ref 30.0–36.0)
MCV: 83.1 fL (ref 80.0–100.0)
Platelets: 117 10*3/uL — ABNORMAL LOW (ref 150–400)
RBC: 4.43 MIL/uL (ref 4.22–5.81)
RDW: 17.7 % — ABNORMAL HIGH (ref 11.5–15.5)
WBC: 4.8 10*3/uL (ref 4.0–10.5)
nRBC: 0 % (ref 0.0–0.2)

## 2023-11-17 LAB — ACETAMINOPHEN LEVEL: Acetaminophen (Tylenol), Serum: <10 ug/mL — ABNORMAL LOW (ref 10–30)

## 2023-11-17 LAB — SALICYLATE LEVEL: Salicylate Lvl: <7 mg/dL — ABNORMAL LOW (ref 7.0–30.0)

## 2023-11-17 LAB — ETHANOL: Alcohol, Ethyl (B): <10 mg/dL (ref <10)

## 2023-11-17 MED ORDER — ESCITALOPRAM OXALATE 10 MG PO TABS
10.0000 mg | ORAL_TABLET | Freq: Every day | ORAL | Status: DC
  Administered 2023-11-18 – 2023-11-19 (×2): 10 mg via ORAL
  Filled 2023-11-17 (×3): qty 1

## 2023-11-17 MED ORDER — OLANZAPINE 10 MG IM SOLR: 5.0000 mg | Freq: Four times a day (QID) | INTRAMUSCULAR | Status: DC | PRN

## 2023-11-17 MED ORDER — LORAZEPAM 2 MG/ML IJ SOLN
1.0000 mg | Freq: Four times a day (QID) | INTRAMUSCULAR | Status: DC | PRN
Start: 2023-11-17 — End: 2023-11-19

## 2023-11-17 MED ORDER — LORAZEPAM 2 MG/ML IJ SOLN
1.0000 mg | INTRAMUSCULAR | Status: DC | PRN
Start: 2023-11-17 — End: 2023-11-17
  Administered 2023-11-17: 1 mg via INTRAVENOUS
  Filled 2023-11-17: qty 1

## 2023-11-17 MED ORDER — OLANZAPINE 5 MG PO TBDP: 5.0000 mg | ORAL_TABLET | Freq: Four times a day (QID) | ORAL | Status: DC | PRN

## 2023-11-17 MED ORDER — ARIPIPRAZOLE 5 MG PO TABS
5.0000 mg | ORAL_TABLET | Freq: Every day | ORAL | Status: DC
  Administered 2023-11-18 – 2023-11-19 (×2): 5 mg via ORAL
  Filled 2023-11-17 (×3): qty 1

## 2023-11-17 MED ORDER — HEPARIN SODIUM (PORCINE) 1000 UNIT/ML DIALYSIS
2000.0000 [IU] | Freq: Once | INTRAMUSCULAR | Status: DC
  Filled 2023-11-17: qty 2

## 2023-11-17 MED ORDER — OLANZAPINE 10 MG IM SOLR
10.0000 mg | Freq: Once | INTRAMUSCULAR | Status: DC
  Filled 2023-11-17: qty 10

## 2023-11-17 MED ORDER — LORAZEPAM 1 MG PO TABS
1.0000 mg | ORAL_TABLET | Freq: Four times a day (QID) | ORAL | Status: DC | PRN
  Administered 2023-11-18: 1 mg via ORAL
  Filled 2023-11-17 (×2): qty 1

## 2023-11-17 NOTE — TOC CM/SW Note (Addendum)
 Transition of Care [facility]) - Inpatient Brief Assessment   Patient Details  Name: Ronald Reeves MRN: [id number] Date of Birth: Jul 19, 1956  Transition of Care [facility]) CM/SW Contact:    Mearl Latin, LCSW Phone Number: 11/17/2023, 4:09 PM   Clinical Narrative: Patient is IVC'd from home with spouse. [facility]. CSW contacted the [facility] who reported they do not have beds available.  After speaking with the MD, he is requesting a medical inpatient bed. CSW contacted the AOD [phone] and was instructed to fax clinicals to [phone]. [phone]. MD updated.      Transition of Care Asessment: Insurance and Status: Insurance coverage has been reviewed Patient has primary care physician: Yes Home environment has been reviewed: from home Prior level of function:: Mod Independent Prior/Current Home Services: No current home services Social Drivers of Health Review: SDOH reviewed no interventions necessary Readmission risk has been reviewed: Yes Transition of care needs: transition of care needs identified, TOC will continue to follow

## 2023-11-17 NOTE — Progress Notes (Signed)
 IR request for HD catheter placement.   After discussion with the ordering team and our attending physician Dr. Elby Showers, new HD catheter placement not recommended at this time. Patient has a current functional graft, but is refusing to use it. Per Nephrology's note 11/17/23, the team will attempt to utilize graft tomorrow for dialysis.   Please contact IR with any further concerns or questions, or if graft is not functional.

## 2023-11-17 NOTE — Progress Notes (Signed)
Pt receives out-pt HD at [facility] on [address]. Will assist as needed.   Shemiah Rosch Renal Navigator [phone] 

## 2023-11-17 NOTE — Consult Note (Addendum)
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 (LASIX) injection 40 mg, 40 mg, Intravenous, BID, Howerter, Justin B, DO, 40 mg at 11/17/23 0830   heparin injection 2,000 Units, 2,000 Units, Dialysis, Once in dialysis, Stovall, Kathryn R, PA-C   hydrALAZINE (APRESOLINE) tablet 25 mg, 25 mg, Oral, BID, Adela Lank, Dan, DO, 25 mg at 11/17/23 0830   isosorbide mononitrate (IMDUR) 24 hr tablet 30 mg, 30 mg, Oral, Daily, Adela Lank, Dan, DO, 30 mg at 11/17/23 0830   LORazepam (ATIVAN) injection 1 mg, 1 mg, Intravenous, Q4H PRN, Elgergawy, Leana Roe, MD, 1 mg at 11/17/23 0830   melatonin tablet 3 mg, 3 mg, Oral, QHS PRN, Howerter, Justin B, DO   mirtazapine (REMERON) tablet 30 mg, 30 mg, Oral, QHS, Floyd, Dan, DO, 30 mg at 11/16/23 2026   OLANZapine zydis (ZYPREXA) disintegrating tablet 10 mg, 10 mg, Oral, Once PRN, Melene Plan, DO   ondansetron [facility]) injection 4 mg, 4 mg, Intravenous, Q6H PRN, Howerter, Justin B, DO   oxyCODONE-acetaminophen (PERCOCET/ROXICET) 5-325 MG per tablet 1 tablet, 1 tablet, Oral, Q4H PRN, Adela Lank, Dan, DO   rosuvastatin (CRESTOR) tablet 10 mg, 10 mg, Oral, Daily, Adela Lank, Dan, DO, 10 mg at 11/17/23 1914    sevelamer carbonate (RENVELA) tablet 1,600 mg, 1,600 mg, Oral, TID WC, Melene Plan, DO, 1,600 mg at 11/17/23 0830   tamsulosin (FLOMAX) capsule 0.4 mg, 0.4 mg, Oral, QHS, Floyd, Dan, DO, 0.4 mg at 11/16/23 2026  Allergies: Allergies  Allergen Reactions   Enalapril Other (See Comments)    Renal failure syndrome   Gabapentin     Encephalopathy, tremor   Iodinated Contrast Media Nausea And Vomiting  Lorri Frederick, MD

## 2023-11-17 NOTE — Progress Notes (Addendum)
 [facility] Progress Note   Subjective:  Brought up for HD today - once here, he refused to run, refused cannulation of AVG. His femoral TDC was half pulled out and taped to his leg - not useable, unclear when this happened (RN noticed this AM) - no bleeding, bandage was dry. Refused exam by me. I asked if he wanted to just not have dialysis today or not have ever again - he said he would think about it. Sharp change from yesterday.  Objective Vitals:   11/17/23 0314 11/17/23 0500 11/17/23 0625 11/17/23 0801  BP: 128/64  (!) 137/91 139/70  Pulse:   80 79  Resp: 12  18 19   Temp:   98 F (36.7 C)   TempSrc:   Oral   SpO2:   99% 99%  Weight:  66 kg    Height:       Physical Exam General: Frail appearing man, NAD. Curled on side - refusing full exam. Heart: RRR; 2/6 murmur Lungs: bibasilar rales present Extremities: 2+ BLE edema to knees Dialysis Access: RUE AVG. Femoral TDC is nearly pulled all the way out - taped to leg, no bleeding.  Additional Objective Labs: Basic Metabolic Panel: Recent Labs  Lab 11/15/23 1130 11/16/23 0301 11/16/23 0424 11/17/23 0428  NA 135 134* 134* 134*  K 4.6 3.7 3.9 3.8  CL 98  --  97* 97*  CO2 20*  --  24 22  GLUCOSE 151*  --  108* 122*  BUN 50*  --  34* 45*  CREATININE 10.20*  --  7.16* 8.45*  CALCIUM 9.5  --  8.9 9.0  PHOS  --   --  3.9  --    Liver Function Tests: Recent Labs  Lab 11/15/23 0318 11/15/23 1130 11/16/23 0424  AST 21 21 21   ALT 10 10 11   ALKPHOS 122 114 80  BILITOT 0.9 0.8 0.6  PROT 7.4 7.2 6.9  ALBUMIN 3.3* 3.4* 3.1*   Recent Labs  Lab 11/15/23 0318  LIPASE 30   CBC: Recent Labs  Lab 11/15/23 0318 11/15/23 1130 11/16/23 0301 11/16/23 0424 11/17/23 0428  WBC 5.6 5.8  --  6.6 4.8  NEUTROABS  --  3.5  --  4.5  --   HGB 12.1* 12.5* 14.3 12.7* 12.1*  HCT 37.7* 39.1 42.0 39.5 36.8*  MCV 86.3 85.9  --  85.7 83.1  PLT 111* 143*  --  107* 117*   Studies/Results: DG Chest 2 View Result  Date: 11/16/2023 CLINICAL DATA:  Shortness of breath. EXAM: CHEST - 2 VIEW COMPARISON:  November 15, 2023 FINDINGS: The cardiac silhouette is enlarged and unchanged in size. Moderate to marked severity calcification of the aortic arch is seen. There is mild prominence of the central pulmonary vasculature. No focal consolidation, pleural effusion or pneumothorax is seen. Multilevel degenerative changes are noted throughout the thoracic spine. IMPRESSION: Cardiomegaly with mild central pulmonary vascular congestion. Electronically Signed   By: Aram Candela M.D.   On: 11/16/2023 02:25   DG CHEST PORT 1 VIEW Result Date: 11/15/2023 CLINICAL DATA:  Shortness of breath.  End-stage renal disease. EXAM: PORTABLE CHEST 1 VIEW COMPARISON:  Two-view chest x-ray 08/30/2023 FINDINGS: The heart is enlarged. Atherosclerotic changes are present the aortic arch. Mild interstitial edema is present. Left basilar airspace disease likely reflects atelectasis. Degenerative changes are present at both shoulders, stable. IMPRESSION: 1. Cardiomegaly and mild interstitial edema compatible with congestive heart failure. 2. Left basilar airspace disease likely reflects atelectasis. Electronically  Signed   By: Marin Roberts M.D.   On: 11/15/2023 18:17   CT Head Wo Contrast Result Date: 11/15/2023 CLINICAL DATA:  Altered mental status. EXAM: CT HEAD WITHOUT CONTRAST TECHNIQUE: Contiguous axial images were obtained from the base of the skull through the vertex without intravenous contrast. RADIATION DOSE REDUCTION: This exam was performed according to the departmental dose-optimization program which includes automated exposure control, adjustment of the mA and/or kV according to patient size and/or use of iterative reconstruction technique. COMPARISON:  06/10/2023. FINDINGS: Brain: No acute intracranial hemorrhage, midline shift or mass effect is seen. No extra-axial fluid collection. Mild diffuse atrophy is noted. Periventricular  white matter hypodensities are present bilaterally. No hydrocephalus. Vascular: No hyperdense vessel or unexpected calcification. Skull: Normal. Negative for fracture or focal lesion. Sinuses/Orbits: Round opacities are noted in the right sphenoid sinus and left ethmoid sinus. No acute orbital abnormality. 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 No acute abnormality is seen. IMPRESSION: 1. No acute intracranial process. 2. Atrophy with chronic microvascular ischemic changes. Electronically Signed   By: Thornell Sartorius M.D.   On: 11/15/2023 14:13   Medications:   (feeding supplement) PROSource Plus  30 mL Oral BID BM   amLODipine  10 mg Oral Daily   apixaban  5 mg Oral BID   arformoterol  15 mcg Nebulization BID   And   umeclidinium bromide  1 puff Inhalation Daily   calcitRIOL  2 mcg Oral Q T,Th,Sa-HD   carvedilol  12.5 mg Oral BID WC   Chlorhexidine Gluconate Cloth  6 each Topical Q0600   escitalopram  20 mg Oral QHS   furosemide  40 mg Intravenous BID   hydrALAZINE  25 mg Oral BID   isosorbide mononitrate  30 mg Oral Daily   mirtazapine  30 mg Oral QHS   rosuvastatin  10 mg Oral Daily   sevelamer carbonate  1,600 mg Oral TID WC   tamsulosin  0.4 mg Oral QHS    Dialysis Orders TTS - NW 4hr, 400/A1.5, EDW 64kg, 2K/2Ca, TDC + RUE AVG, heparin 2500units q HD - no ESA, Hgb typically >11 - Calcitriol PO q HD   Assessment/Plan: IVC/suicidal ideation: Sitter in place, yesterday was feeling good, sharp change this morning - refusing everything. Is psych going to see him today? Acute Hypoxic Resp Failure/pulm edema: S/p HD overnight 4/6. Still with rales and edema, refusing HD. ESRD: Resume usual TTS schedule -> refused HD today, refused AVG cannulation, nearly pulled his TDC out at some point over night, IR asked to pull out completely.  HTN/volume: BP decent, lots of edema. UF as tolerated, will need lower EDW on discharge. Anemia of ESRD: Hgb > 12, no ESA  needed Secondary HPTH: Ca/Phos ok - continue sevelamer as binder, resume VDRA. Nutrition: Alb low, adding supps. A-fib: On Eliquis COPD HFrEF (25% EF)   Ozzie Hoyle, PA-C 11/17/2023, 10:31 AM  [facility]

## 2023-11-17 NOTE — Progress Notes (Signed)
 PT UNCOOPERATIVE WITH HD REFUSING TO BE STUCK HDC DISLODGED AND TAPED TO RIGHT LEG NO BLEEDING NOTED LINE SECURED MD AT BEDSIDE PT BEING TRANSPORTED BACK TO ROOM NURSE NOTIFIED

## 2023-11-17 NOTE — Progress Notes (Addendum)
 PROGRESS NOTE   Ronald Reeves  ZOX:[id number]    DOB: 1955-08-28    DOA: 11/15/2023  PCP: Benjiman Core, MD   I have briefly reviewed patients previous medical records in [facility].  Chief Complaint  Patient presents with   Aggressive Behavior   IVC    Brief Hospital Course:   68 year old male with medical history significant for ESRD on MWF HD, ongoing tobacco use, COPD,??  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  Per EDP note, patient refusing dialysis and threatening to kill himself as per family.  Received multiple doses of Versed, Geodon and Benadryl.  Psychiatry consulted and recommended inpatient psychiatry admission under IVC pending medical stabilization.  Nephrology consulted, underwent dialysis on 4/6.   Assessment & Plan:  Principal Problem:   Acute on chronic systolic (congestive) heart failure (HCC) Active Problems:   Essential hypertension   SOB (shortness of breath)   HLD (hyperlipidemia)   ESRD (end stage renal disease) (HCC)   Acute hypoxic respiratory failure (HCC)   Suicidal ideation   History of depression   Paroxysmal atrial fibrillation (HCC)   Acute respiratory failure with hypoxia Secondary to decompensated CHF in the context of missing/incomplete hemodialysis Continue oxygen support Not on home oxygen.  Wean off oxygen as tolerated.   - He has been restless and agitated today, now  wearing his oxygen.   Acute on chronic bind systolic and diastolic CHF 2D echo 09/04/2023: LVEF 20-25%, LV global hypokinesis, grade 3 diastolic dysfunction.  Severe pulmonary artery hypertension. Volume management across dialysis.   ESRD on TTS HD As per nephrology input, patient has missed several HD sessions. Nephrology consulted for dialysis needs. Received dialysis overnight 4/6 but may need serial dialysis for volume management.  Nephrology to follow. - He refused dialysis this morning, he refused cannulation of his AVG graft.  His right femoral tunneled dialysis catheter has dislodged, and need to be removed, IR consulted, but patient is refusing as well - IR will attend dialysis tomorrow using AVG.  Acute toxic metabolic encephalopathy Multifactorial Secondary to substance abuse complicating uremia from incomplete/missed dialysis and acute respiratory failure with hypoxia. Treat the underlying conditions and monitor. CT head without acute findings. Significantly combative, today, argumentative, refused all his cares, psychiatry input appreciated.  IVC, even if wife's IVC that is expiring tomorrow, he will be IVC need as he will need inpatient psychiatric admission as he is not cleared yet as still expressing suicidal and homicidal thoughts - Patient cannot leave AMA, as well family cannot take patient as discussed with psychiatry - Patient on both mirtazapine and Lexapro, mirtazapine has been discontinued, and will taper Lexapro (to avoid serotonin rebound) given QTc of 501.  As he will need Anti psychotic meds  Suicidal/homicidal ideations Presented to ED with agitation, threats to hurt somebody and family report of patient wanting to kill himself by missing dialysis. Psychiatry consulted and recommended inpatient psychiatry admission under IVC pending medical stabilization. Patient cannot sign out  AMA  Polysubstance abuse (tobacco, cocaine, THC) Cessation counseled. Check  UDS. Patient declines nicotine patch  Essential hypertension Mildly uncontrolled Volume management across dialysis Continue amlodipine, carvedilol, hydralazine and Imdur  Dyslipidemia Continue statins  BPH Continue tamsulosin  COPD Does have some wheezing which may be either due to pulmonary edema versus mild COPD flare Needs to be reassessed after dialysis Continue breathing treatments as below. Flutter valve if cooperates.  Anxiety/depression Management per psychiatry  Thrombocytopenia Chronic and intermittent Follow CBCs periodically.  DVT/?  Paroxysmal A-fib Will need to verify if he truly has A-fib, noted in H&P. Continue Eliquis anticoagulation.  Medical noncompliance History of missing multiple dialysis Overall failure to thrive.  Body mass index is 20.29 kg/m./Underweight  I have discussed with patient's wife, who is his HCPOA, and she does request him to go to [facility], as he has been there recently and tolerated dialysis well there, she feels he will be more comfortable.  And more compliant, so TOC has been consulted to initiate process to transfer to [facility].  DVT prophylaxis: SCDs Start: 11/16/23 0416     Code Status: Full Code:  Family Communication: None at bedside, D/W wife by phone. Disposition:  Status is: Inpatient Remains inpatient appropriate because: Remains volume overloaded with need for repeat hemodialysis, medical stabilization prior to discharge to inpatient psychiatry.     Consultants:   Nephrology Psychiatry  Procedures:   Hemodialysis  Antimicrobials:   None   Subjective:   Has been combative, noncompliant refused dialysis and TDC catheter change, pulled his IV  Objective:   Vitals:   11/17/23 0314 11/17/23 0500 11/17/23 0625 11/17/23 0801  BP: 128/64  (!) 137/91 139/70  Pulse:   80 79  Resp: 12  18 19   Temp:   98 F (36.7 C)   TempSrc:   Oral   SpO2:   99% 99%  Weight:  66 kg    Height:       Awake  Alert, argumentative, poor judgment and insight, in no apparent distress He refused me examining him.    Data Reviewed:   I have personally reviewed following labs and imaging studies   CBC: Recent Labs  Lab 11/15/23 1130 11/16/23 0301 11/16/23 0424 11/17/23 0428  WBC 5.8  --  6.6 4.8  NEUTROABS 3.5  --  4.5  --   HGB 12.5* 14.3 12.7* 12.1*  HCT 39.1 42.0 39.5 36.8*  MCV 85.9  --  85.7 83.1  PLT 143*  --  107* 117*    Basic Metabolic Panel: Recent Labs  Lab 11/15/23 0318 11/15/23 1130 11/16/23 0301 11/16/23 0424 11/17/23 0428  NA 137 135 134* 134* 134*  K 3.7 4.6 3.7 3.9 3.8  CL 97* 98  --  97* 97*  CO2 22 20*  --  24 22  GLUCOSE 140* 151*  --  108* 122*  BUN 46* 50*  --  34* 45*  CREATININE 10.03* 10.20*  --  7.16* 8.45*  CALCIUM 9.5 9.5  --  8.9 9.0  MG  --   --   --  2.4  --   PHOS  --   --   --  3.9  --     Liver Function Tests: Recent Labs  Lab 11/15/23 0318 11/15/23 1130 11/16/23 0424  AST 21 21 21   ALT 10 10 11   ALKPHOS 122 114 80  BILITOT 0.9 0.8 0.6  PROT 7.4 7.2 6.9  ALBUMIN 3.3* 3.4* 3.1*    CBG: No  results for input(s): "GLUCAP" in the last 168 hours.  Microbiology Studies:  No results found for this or any previous visit (from the past 240 hours).  Radiology Studies:  DG Chest 2 View Result Date: 11/16/2023 CLINICAL DATA:  Shortness of breath. EXAM: CHEST - 2 VIEW COMPARISON:  November 15, 2023 FINDINGS: The cardiac silhouette is enlarged and unchanged in size. Moderate to marked severity calcification of the aortic arch is seen. There is mild prominence of the central pulmonary vasculature. No focal consolidation, pleural effusion or pneumothorax is seen. Multilevel degenerative changes are noted throughout the thoracic spine. IMPRESSION: Cardiomegaly with mild central pulmonary vascular congestion. Electronically Signed   By: Aram Candela M.D.   On: 11/16/2023 02:25   DG CHEST PORT 1 VIEW Result Date: 11/15/2023 CLINICAL DATA:   Shortness of breath.  End-stage renal disease. EXAM: PORTABLE CHEST 1 VIEW COMPARISON:  Two-view chest x-ray 08/30/2023 FINDINGS: The heart is enlarged. Atherosclerotic changes are present the aortic arch. Mild interstitial edema is present. Left basilar airspace disease likely reflects atelectasis. Degenerative changes are present at both shoulders, stable. IMPRESSION: 1. Cardiomegaly and mild interstitial edema compatible with congestive heart failure. 2. Left basilar airspace disease likely reflects atelectasis. Electronically Signed   By: Marin Roberts M.D.   On: 11/15/2023 18:17    Scheduled Meds:    (feeding supplement) PROSource Plus  30 mL Oral BID BM   amLODipine  10 mg Oral Daily   apixaban  5 mg Oral BID   arformoterol  15 mcg Nebulization BID   And   umeclidinium bromide  1 puff Inhalation Daily   calcitRIOL  2 mcg Oral Q T,Th,Sa-HD   carvedilol  12.5 mg Oral BID WC   Chlorhexidine Gluconate Cloth  6 each Topical Q0600   escitalopram  20 mg Oral QHS   furosemide  40 mg Intravenous BID   hydrALAZINE  25 mg Oral BID   isosorbide mononitrate  30 mg Oral Daily   mirtazapine  30 mg Oral QHS   OLANZapine  10 mg Intramuscular Once   rosuvastatin  10 mg Oral Daily   sevelamer carbonate  1,600 mg Oral TID WC   tamsulosin  0.4 mg Oral QHS    Continuous Infusions:     LOS: 1 day     Huey Bienenstock, MD,    To contact the attending provider between 7A-7P or the covering provider during after hours 7P-7A, please log into the web site [email or web address] and access using universal [place] password for that web site. If you do not have the password, please call the hospital operator.  11/17/2023, 2:00 PM

## 2023-11-18 ENCOUNTER — Inpatient Hospital Stay (HOSPITAL_COMMUNITY)

## 2023-11-18 DIAGNOSIS — J9601 Acute respiratory failure with hypoxia: Secondary | ICD-10-CM | POA: Diagnosis not present

## 2023-11-18 DIAGNOSIS — I10 Essential (primary) hypertension: Secondary | ICD-10-CM | POA: Diagnosis not present

## 2023-11-18 DIAGNOSIS — I5023 Acute on chronic systolic (congestive) heart failure: Secondary | ICD-10-CM | POA: Diagnosis not present

## 2023-11-18 DIAGNOSIS — N186 End stage renal disease: Secondary | ICD-10-CM | POA: Diagnosis not present

## 2023-11-18 HISTORY — PX: IR REMOVAL TUN CV CATH W/O FL: IMG2289

## 2023-11-18 LAB — HEPATITIS B SURFACE ANTIBODY, QUANTITATIVE: Hep B S AB Quant (Post): 18.8 m[IU]/mL

## 2023-11-18 LAB — BASIC METABOLIC PANEL WITH GFR
Anion gap: 15 (ref 5–15)
BUN: 51 mg/dL — ABNORMAL HIGH (ref 8–23)
CO2: 24 mmol/L (ref 22–32)
Calcium: 8.8 mg/dL — ABNORMAL LOW (ref 8.9–10.3)
Chloride: 98 mmol/L (ref 98–111)
Creatinine, Ser: 9.46 mg/dL — ABNORMAL HIGH (ref 0.61–1.24)
GFR, Estimated: 6 mL/min — ABNORMAL LOW (ref >60)
Glucose, Bld: 131 mg/dL — ABNORMAL HIGH (ref 70–99)
Potassium: 4.1 mmol/L (ref 3.5–5.1)
Sodium: 137 mmol/L (ref 135–145)

## 2023-11-18 LAB — CBC
HCT: 36.5 % — ABNORMAL LOW (ref 39.0–52.0)
Hemoglobin: 11.8 g/dL — ABNORMAL LOW (ref 13.0–17.0)
MCH: 26.9 pg (ref 26.0–34.0)
MCHC: 32.3 g/dL (ref 30.0–36.0)
MCV: 83.3 fL (ref 80.0–100.0)
Platelets: 123 10*3/uL — ABNORMAL LOW (ref 150–400)
RBC: 4.38 MIL/uL (ref 4.22–5.81)
RDW: 17.5 % — ABNORMAL HIGH (ref 11.5–15.5)
WBC: 4.5 10*3/uL (ref 4.0–10.5)
nRBC: 0 % (ref 0.0–0.2)

## 2023-11-18 MED ORDER — HEPARIN SODIUM (PORCINE) 1000 UNIT/ML IJ SOLN
2000.0000 [IU] | Freq: Once | INTRAMUSCULAR | Status: AC
  Administered 2023-11-18: 2000 [IU]
  Filled 2023-11-18: qty 2

## 2023-11-18 MED ORDER — ALTEPLASE 2 MG IJ SOLR: 2.0000 mg | Freq: Once | INTRAMUSCULAR | Status: DC | PRN

## 2023-11-18 MED ORDER — ANTICOAGULANT SODIUM CITRATE 4% (200MG/5ML) IV SOLN: 5.0000 mL | Status: DC | PRN

## 2023-11-18 MED ORDER — PENTAFLUOROPROP-TETRAFLUOROETH EX AERO: 1.0000 | INHALATION_SPRAY | CUTANEOUS | Status: DC | PRN

## 2023-11-18 MED ORDER — LIDOCAINE HCL 1 % IJ SOLN
INTRAMUSCULAR | Status: AC
  Filled 2023-11-18: qty 20

## 2023-11-18 MED ORDER — LIDOCAINE-PRILOCAINE 2.5-2.5 % EX CREA: 1.0000 | TOPICAL_CREAM | CUTANEOUS | Status: DC | PRN

## 2023-11-18 MED ORDER — HEPARIN SODIUM (PORCINE) 1000 UNIT/ML DIALYSIS: 1000.0000 [IU] | INTRAMUSCULAR | Status: DC | PRN

## 2023-11-18 MED ORDER — LIDOCAINE HCL (PF) 1 % IJ SOLN: 5.0000 mL | INTRAMUSCULAR | Status: DC | PRN

## 2023-11-18 NOTE — Consult Note (Signed)
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 Medications:    (feeding supplement) PROSource Plus liquid 30 mL, 30 mL, Oral, BID BM, Stovall, Kathryn R, PA-C   acetaminophen (TYLENOL) tablet 650 mg, 650 mg, Oral, Q6H PRN **OR** acetaminophen (TYLENOL) suppository 650 mg, 650 mg, Rectal, Q6H PRN, Howerter, Justin B, DO   albuterol (PROVENTIL) (2.5 MG/3ML) 0.083% nebulizer solution 2.5 mg, 2.5 mg, Nebulization, Q6H PRN, Adela Lank, Dan, DO, 2.5 mg at 11/15/23 1655   amLODipine (NORVASC) tablet 10 mg, 10 mg, Oral, Daily, Adela Lank, Dan, DO, 10 mg at 11/17/23 0830   apixaban (ELIQUIS) tablet 5 mg, 5 mg, Oral, BID, Adela Lank, Dan, DO, 5 mg at 11/17/23 2052   arformoterol (BROVANA) nebulizer solution 15 mcg, 15 mcg, Nebulization, BID, 15 mcg at 11/16/23 2113 **AND** umeclidinium bromide (INCRUSE ELLIPTA) 62.5 MCG/ACT 1 puff, 1 puff, Inhalation, Daily, Melene Plan, DO, 1 puff at 11/15/23 1615   ARIPiprazole (ABILIFY) tablet 5 mg, 5 mg, Oral, Daily, Goli, Veeraindar, MD   calcitRIOL (ROCALTROL) capsule 2 mcg, 2 mcg, Oral, Q T,Th,Sa-HD, Stovall, Kathryn R, PA-C   carvedilol (COREG) tablet 12.5 mg, 12.5 mg, Oral, BID WC, Adela Lank, Dan, DO, 12.5 mg at 11/17/23 1811    Chlorhexidine Gluconate Cloth 2 % PADS 6 each, 6 each, Topical, Q0600, Delano Metz, MD, 6 each at 11/17/23 0640   escitalopram (LEXAPRO) tablet 10 mg, 10 mg, Oral, Daily, Elgergawy, Leana Roe, MD   furosemide (LASIX) injection 40 mg, 40 mg, Intravenous, BID, Howerter, Justin B, DO, 40 mg at 11/17/23 1754   hydrALAZINE (APRESOLINE) tablet 25 mg, 25 mg, Oral, BID, Adela Lank, Dan, DO, 25 mg at 11/17/23 2051   isosorbide mononitrate (IMDUR) 24 hr tablet 30 mg, 30 mg, Oral, Daily, Adela Lank, Dan, DO, 30 mg at 11/17/23 0830   LORazepam (ATIVAN) tablet 1 mg, 1 mg, Oral, Q6H PRN **OR** LORazepam (ATIVAN) injection 1 mg, 1 mg,  Intramuscular, Q6H PRN, Rex Kras, MD   melatonin tablet 3 mg, 3 mg, Oral, QHS PRN, Howerter, Justin B, DO, 3 mg at 11/17/23 2052   OLANZapine zydis (ZYPREXA) disintegrating tablet 5 mg, 5 mg, Oral, QID PRN **OR** OLANZapine (ZYPREXA) injection 5 mg, 5 mg, Intramuscular, QID PRN, Rex Kras, MD   ondansetron (ZOFRAN) injection 4 mg, 4 mg, Intravenous, Q6H PRN, Howerter, Justin B, DO   oxyCODONE-acetaminophen (PERCOCET/ROXICET) 5-325 MG per tablet 1 tablet, 1 tablet, Oral, Q4H PRN, Adela Lank, Dan, DO   rosuvastatin (CRESTOR) tablet 10 mg, 10 mg, Oral, Daily, Adela Lank, Dan, DO, 10 mg at 11/17/23 3086   sevelamer carbonate (RENVELA) tablet 1,600 mg, 1,600 mg, Oral, TID WC, Melene Plan, DO, 1,600 mg at 11/17/23 1754   tamsulosin (FLOMAX) capsule 0.4 mg, 0.4 mg, Oral, QHS, Floyd, Dan, DO, 0.4 mg at 11/17/23 2051  Allergies: Allergies  Allergen Reactions   Enalapril Other (See Comments)    Renal failure syndrome   Gabapentin     Encephalopathy, tremor   Iodinated Contrast Media Nausea And Vomiting    Lorri Frederick, MD

## 2023-11-18 NOTE — Progress Notes (Signed)
 Pt received meal and snack.

## 2023-11-18 NOTE — Progress Notes (Signed)
 [facility] Progress Note   Subjective:  Seen in room - refused HD yesterday. We talked about if he is willing for HD today - says "I guess I don't really have a choice." Explained that will need to cannulate his RUE AVG today since his catheter is ok - he agrees.  Objective Vitals:   11/17/23 0801 11/17/23 1950 11/18/23 0515 11/18/23 0746  BP: 139/70 (!) 141/68 130/89 133/77  Pulse: 79 73 67 67  Resp: 19     Temp:      TempSrc:      SpO2: 99% 100% 97% 96%  Weight:      Height:       Physical Exam General: Frail appearing man, NAD. Eyes closed, but speaking to me Heart: RRR; 2/6 murmur Lungs: CTA anteriorly Extremities: 1+ BLE edema to knees Dialysis Access: RUE AVG. Clean bandage to R thigh where TDC was removed.  Additional Objective Labs: Basic Metabolic Panel: Recent Labs  Lab 11/16/23 0424 11/17/23 0428 11/18/23 0420  NA 134* 134* 137  K 3.9 3.8 4.1  CL 97* 97* 98  CO2 24 22 24   GLUCOSE 108* 122* 131*  BUN 34* 45* 51*  CREATININE 7.16* 8.45* 9.46*  CALCIUM 8.9 9.0 8.8*  PHOS 3.9  --   --    Liver Function Tests: Recent Labs  Lab 11/15/23 0318 11/15/23 1130 11/16/23 0424  AST 21 21 21   ALT 10 10 11   ALKPHOS 122 114 80  BILITOT 0.9 0.8 0.6  PROT 7.4 7.2 6.9  ALBUMIN 3.3* 3.4* 3.1*   Recent Labs  Lab 11/15/23 0318  LIPASE 30   CBC: Recent Labs  Lab 11/15/23 0318 11/15/23 1130 11/16/23 0301 11/16/23 0424 11/17/23 0428 11/18/23 0420  WBC 5.6 5.8  --  6.6 4.8 4.5  NEUTROABS  --  3.5  --  4.5  --   --   HGB 12.1* 12.5*   < > 12.7* 12.1* 11.8*  HCT 37.7* 39.1   < > 39.5 36.8* 36.5*  MCV 86.3 85.9  --  85.7 83.1 83.3  PLT 111* 143*  --  107* 117* 123*   < > = values in this interval not displayed.   Medications:   (feeding supplement) PROSource Plus  30 mL Oral BID BM   amLODipine  10 mg Oral Daily   apixaban  5 mg Oral BID   arformoterol  15 mcg Nebulization BID   And   umeclidinium bromide  1 puff Inhalation Daily    ARIPiprazole  5 mg Oral Daily   calcitRIOL  2 mcg Oral Q T,Th,Sa-HD   carvedilol  12.5 mg Oral BID WC   Chlorhexidine Gluconate Cloth  6 each Topical Q0600   escitalopram  10 mg Oral Daily   furosemide  40 mg Intravenous BID   hydrALAZINE  25 mg Oral BID   isosorbide mononitrate  30 mg Oral Daily   rosuvastatin  10 mg Oral Daily   sevelamer carbonate  1,600 mg Oral TID WC   tamsulosin  0.4 mg Oral QHS    Dialysis Orders TTS - NW 4hr, 400/A1.5, EDW 64kg, 2K/2Ca, TDC + RUE AVG, heparin 2500units q HD - no ESA, Hgb typically >11 - Calcitriol PO q HD   Assessment/Plan: IVC/suicidal ideation: Sitter in place, variable mood - refusing everything 4/8 including HD. Acute Hypoxic Resp Failure/pulm edema: S/p HD overnight 4/6. Still with rales and edema, but improving. ESRD: Usual TTS schedule -> refused HD 4/8, refused AVG cannulation,  nearly pulled his TDC out at some point over night, IR later removed completely. Trying for HD again today, using AVG. HTN/volume: BP decent, lots of edema. UF as tolerated, will need lower EDW on discharge. 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 Nutrition: Alb low, continue supps. A-fib: On Eliquis COPD HFrEF (25% EF)   Ozzie Hoyle, PA-C 11/18/2023, 10:59 AM  [facility]

## 2023-11-18 NOTE — Progress Notes (Signed)
 Spoke with this pts primary RN-Sarah Markham Jordan RN-in regards to this pt coming to HD this am----was determined that we will wait till the second shift to attempt his tx due to his noncompliance throughout the night Nephrology will be notified

## 2023-11-18 NOTE — Progress Notes (Addendum)
 PROGRESS NOTE        PATIENT DETAILS Name: Ronald Reeves Age: 67 y.o. Sex: male Date of Birth: 10-Oct-1955 Admit Date: 11/15/2023 Admitting Physician Angie Fava, DO [id number], Rosalita Chessman, MD  Brief Summary: Patient is a 68 y.o.  male with history of CAD on HD, PAF, chronic HFrEF, CVA, VTE on Eliquis, polysubstance abuse (cocaine/THC)-presented on 4/6 with vomiting-unable to complete dialysis x 3 weeks and worsening lower extremity edema.  He was evaluated in the ED-and left AGAINST MEDICAL ADVICE.  He subsequently was brought back to the ED the same day-after threatening to kill himself-patient was IVC by the family and subsequently admitted to [facility] service.    Significant events: 4/6>> admit to [facility]-after leaving ED AMA-brought back by family-threatening to kill himself-IVC by family.  Significant studies: 4/6>> CT head: No acute intracranial process 4/6>> CXR: Interstitial edema-left basilar airspace disease-likely reflects atelectasis.  Significant microbiology data: None  Procedures: 4/9>> femoral HD catheter removed by IR.  Consults: Psychiatry Nephrology IR.  Subjective: Lying comfortably in bed-denies any chest pain or shortness of breath.  Cooperative this morning.  Objective: Vitals: Blood pressure 133/77, pulse 67, temperature 98 F (36.7 C), temperature source Oral, resp. rate 19, height 5\' 11"  (1.803 m), weight 66 kg, SpO2 96%.   Exam: Gen Exam:Alert awake-not in any distress HEENT:atraumatic, normocephalic Chest: B/L clear to auscultation anteriorly CVS:S1S2 regular Abdomen:soft non tender, non distended Extremities:no edema Neurology: Non focal Skin: no rash  Pertinent Labs/Radiology:    Latest Ref Rng & Units 11/18/2023    4:20 AM 11/17/2023    4:28 AM 11/16/2023    4:24 AM  CBC  WBC 4.0 - 10.5 K/uL 4.5  4.8  6.6   Hemoglobin 13.0 - 17.0 g/dL 65.7  84.6  96.2   Hematocrit 39.0 - 52.0 % 36.5  36.8  39.5   Platelets 150  - 400 K/uL 123  117  107     Lab Results  Component Value Date   NA 137 11/18/2023   K 4.1 11/18/2023   CL 98 11/18/2023   CO2 24 11/18/2023      Assessment/Plan: Acute hypoxic respiratory failure Second due to decompensated HFrEF-in the setting of incomplete HD Resolved with dialysis-currently on room air.  Acute on chronic combined HFrEF/HFpEF Euvolemic-lying flat Volume removal with HD  ESRD on HD Per nephrology Refused HD on 4/8-plan is for HD on 4/9-IR finally removed femoral HD catheter/9  Acute toxic metabolic encephalopathy Secondary to missed HD/hypoxia/substance abuse Awake-alert-cooperative this morning Under IVC-cannot leave AMA-family cannot take patient See below.  Suicidal/homicidal ideation Per prior notes-patient threatening to kill himself by missing dialysis Under IVC-cannot sign out AMA Calm/cooperative today-but apparently does get agitated at times Remains on Abilify Continue as needed Zyprexa/lorazepam Psych following-antipsychotics deferred to psychiatry service.  HTN BP stable Amlodipine/Coreg/hydralazine/Imdur  HLD Statin  BPH Flonax  Hx of VTE Hx of PAF Telemetry monitoring Eliquis  Thrombocytopenia Mild Follow CBC periodically  COPD No wheezing Bronchodilators  History of anxiety/depression Remains on Lexapro/Abilify Psychiatry following.  Other issue's At spouse's request-TOC team trying to arrange to transfer to [facility].  BMI: Estimated body mass index is 20.29 kg/m as calculated from the following:   Height as of this encounter: 5\' 11"  (1.803 m).   Weight as of this encounter: 66 kg.   Code status:   Code Status:  Full Code   DVT Prophylaxis: SCDs Start: 11/16/23 0416 apixaban (ELIQUIS) tablet 5 mg     Family Communication: Spouse at bedside   Disposition Plan: Status is: Inpatient Remains inpatient appropriate because: Severity of illness   Planned Discharge Destination: [facility]   Diet: Diet Order             Diet regular Room service appropriate? Yes; Fluid consistency: Thin  Diet effective now                     Antimicrobial agents: Anti-infectives (From admission, onward)    None        MEDICATIONS: Scheduled Meds:  (feeding supplement) PROSource Plus  30 mL Oral BID BM   amLODipine  10 mg Oral Daily   apixaban  5 mg Oral BID   arformoterol  15 mcg Nebulization BID   And   umeclidinium bromide  1 puff Inhalation Daily   ARIPiprazole  5 mg Oral Daily   calcitRIOL  2 mcg Oral Q T,Th,Sa-HD   carvedilol  12.5 mg Oral BID WC   Chlorhexidine Gluconate Cloth  6 each Topical Q0600   escitalopram  10 mg Oral Daily   furosemide  40 mg Intravenous BID   hydrALAZINE  25 mg Oral BID   isosorbide mononitrate  30 mg Oral Daily   rosuvastatin  10 mg Oral Daily   sevelamer carbonate  1,600 mg Oral TID WC   tamsulosin  0.4 mg Oral QHS   Continuous Infusions: PRN Meds:.acetaminophen **OR** acetaminophen, albuterol, LORazepam **OR** LORazepam, melatonin, OLANZapine zydis **OR** OLANZapine, ondansetron (ZOFRAN) IV, oxyCODONE-acetaminophen   I have personally reviewed following labs and imaging studies  LABORATORY DATA: CBC: Recent Labs  Lab 11/15/23 0318 11/15/23 1130 11/16/23 0301 11/16/23 0424 11/17/23 0428 11/18/23 0420  WBC 5.6 5.8  --  6.6 4.8 4.5  NEUTROABS  --  3.5  --  4.5  --   --   HGB 12.1* 12.5* 14.3 12.7* 12.1* 11.8*  HCT 37.7* 39.1 42.0 39.5 36.8* 36.5*  MCV 86.3 85.9  --  85.7 83.1 83.3  PLT 111* 143*  --  107* 117* 123*    Basic Metabolic Panel: Recent Labs  Lab 11/15/23 0318 11/15/23 1130 11/16/23 0301 11/16/23 0424 11/17/23 0428 11/18/23 0420  NA 137 135 134* 134* 134* 137  K 3.7 4.6 3.7 3.9 3.8 4.1  CL 97* 98  --  97* 97* 98  CO2 22 20*  --  24 22 24   GLUCOSE 140* 151*  --  108* 122* 131*  BUN 46* 50*  --  34* 45* 51*  CREATININE 10.03* 10.20*  --  7.16* 8.45* 9.46*  CALCIUM 9.5 9.5  --  8.9  9.0 8.8*  MG  --   --   --  2.4  --   --   PHOS  --   --   --  3.9  --   --     GFR: Estimated Creatinine Clearance: 7.2 mL/min (A) (by C-G formula based on SCr of 9.46 mg/dL (H)).  Liver Function Tests: Recent Labs  Lab 11/15/23 0318 11/15/23 1130 11/16/23 0424  AST 21 21 21   ALT 10 10 11   ALKPHOS 122 114 80  BILITOT 0.9 0.8 0.6  PROT 7.4 7.2 6.9  ALBUMIN 3.3* 3.4* 3.1*   Recent Labs  Lab 11/15/23 0318  LIPASE 30   No results for input(s): "AMMONIA" in the last 168 hours.  Coagulation Profile: No results for  input(s): "INR", "PROTIME" in the last 168 hours.  Cardiac Enzymes: No results for input(s): "CKTOTAL", "CKMB", "CKMBINDEX", "TROPONINI" in the last 168 hours.  BNP (last 3 results) No results for input(s): "PROBNP" in the last 8760 hours.  Lipid Profile: No results for input(s): "CHOL", "HDL", "LDLCALC", "TRIG", "CHOLHDL", "LDLDIRECT" in the last 72 hours.  Thyroid Function Tests: No results for input(s): "TSH", "T4TOTAL", "FREET4", "T3FREE", "THYROIDAB" in the last 72 hours.  Anemia Panel: No results for input(s): "VITAMINB12", "FOLATE", "FERRITIN", "TIBC", "IRON", "RETICCTPCT" in the last 72 hours.  Urine analysis:    Component Value Date/Time   COLORURINE YELLOW 08/30/2023 1201   APPEARANCEUR CLEAR 08/30/2023 1201   LABSPEC 1.018 08/30/2023 1201   PHURINE 7.0 08/30/2023 1201   GLUCOSEU NEGATIVE 08/30/2023 1201   HGBUR SMALL (A) 08/30/2023 1201   BILIRUBINUR NEGATIVE 08/30/2023 1201   KETONESUR NEGATIVE 08/30/2023 1201   PROTEINUR 100 (A) 08/30/2023 1201   UROBILINOGEN 1.0 05/20/2013 0532   NITRITE NEGATIVE 08/30/2023 1201   LEUKOCYTESUR NEGATIVE 08/30/2023 1201    Sepsis Labs: Lactic Acid, Venous    Component Value Date/Time   LATICACIDVEN 0.8 04/30/2022 1051    MICROBIOLOGY: No results found for this or any previous visit (from the past 240 hours).  RADIOLOGY STUDIES/RESULTS: No results found.   LOS: 2 days   Jeoffrey Massed,  MD  [facility]    To contact the attending provider between 7A-7P or the covering provider during after hours 7P-7A, please log into the web site [email or web address] and access using universal [place] password for that web site. If you do not have the password, please call the hospital operator.  11/18/2023, 9:39 AM

## 2023-11-18 NOTE — TOC CM/SW Note (Addendum)
 Transition of Care [facility]) - Inpatient Brief Assessment   Patient Details  Name: Ronald Reeves MRN: [id number] Date of Birth: 12-11-1955  [facility] CM/SW Contact:    Mearl Latin, LCSW Phone Number: 11/18/2023, 9:14 AM   Clinical Narrative: 9am-CSW has not received call from the [place]. CSW contacted Tresa Endo, Clinical cytogeneticist at [facility] [phone]). She took patient's info from CSW and requested CSW fax records to her at f. [phone].   12:16 PM-CSW received return call [facility] [place] [facility] who stated that their Physician requests updated notes to be sent once patient successfully has dialysis today due to TDC/catheter issue. She stated they do not have IR capabilities if an issue arises with the HD catheter.   CSW spoke with patient's wife and provided update. She confirms that they would like to transfer to the [facility] due to patient receiving his care there and they allow patient visits outside for fresh air.  Transition of Care Asessment: Insurance and Status: Insurance coverage has been reviewed Patient has primary care physician: Yes Home environment has been reviewed: from home Prior level of function:: Mod Independent Prior/Current Home Services: No current home services Social Drivers of Health Review: SDOH reviewed no interventions necessary Readmission risk has been reviewed: Yes Transition of care needs: transition of care needs identified, TOC will continue to follow

## 2023-11-18 NOTE — Progress Notes (Signed)
 Pt agreeable to have femoral HD catheter removed today. (R)CFV TDC noted to be almost completed out. Catheter the remainder of the way. No complications or bleeding. Dressing applied.  Brayton El PA-C Interventional Radiology 11/18/2023 9:33 AM

## 2023-11-18 NOTE — Progress Notes (Signed)
 Completed 3.5 hours of dialysis. Tolerated net fluid removal of 2.5L. patient c/o leg cramping towards the end of the tx. VS stable, no signs of distress. 3K+ dialysate bath used for K level of 4.1. All blood returned safely. RUA AVF needles x2 de-accessed. Manual pressure applied to sites until hemostasis was achieved. Sites secured with gauze/taped. Patient is alert, awake.in stable condition upon leaving dilaysis unit. Post HD report given to Susann Givens

## 2023-11-19 DIAGNOSIS — I5023 Acute on chronic systolic (congestive) heart failure: Secondary | ICD-10-CM | POA: Diagnosis not present

## 2023-11-19 DIAGNOSIS — N186 End stage renal disease: Secondary | ICD-10-CM | POA: Diagnosis not present

## 2023-11-19 DIAGNOSIS — J9601 Acute respiratory failure with hypoxia: Secondary | ICD-10-CM | POA: Diagnosis not present

## 2023-11-19 DIAGNOSIS — I10 Essential (primary) hypertension: Secondary | ICD-10-CM | POA: Diagnosis not present

## 2023-11-19 MED ORDER — ARIPIPRAZOLE 5 MG PO TABS: 5.0000 mg | ORAL_TABLET | Freq: Every day | ORAL | 1 refills | Status: DC

## 2023-11-19 MED ORDER — ESCITALOPRAM OXALATE 20 MG PO TABS: 10.0000 mg | ORAL_TABLET | Freq: Every day | ORAL | 1 refills | Status: DC

## 2023-11-19 NOTE — Progress Notes (Signed)
 PROGRESS NOTE        PATIENT DETAILS Name: Ronald Reeves Age: 68 y.o. Sex: male Date of Birth: [birth year] Admit Date: 11/15/2023 Admitting Physician Angie Fava, DO ZOX:[id number], Rosalita Chessman, MD  Brief Summary: Patient is a 68 y.o.  male with history of CAD on HD, PAF, chronic HFrEF, CVA, VTE on Eliquis, polysubstance abuse (cocaine/THC)-presented on 4/6 with vomiting-unable to complete dialysis x 3 weeks and worsening lower extremity edema.  He was evaluated in the ED-and left AGAINST MEDICAL ADVICE.  He subsequently was brought back to the ED the same day-after threatening to kill himself-patient was IVC by the family and subsequently admitted to [facility] service.    Significant events: 4/6>> admit to [facility]-after leaving ED AMA-brought back by family-threatening to kill himself-IVC by family.  Significant studies: 4/6>> CT head: No acute intracranial process 4/6>> CXR: Interstitial edema-left basilar airspace disease-likely reflects atelectasis.  Significant microbiology data: None  Procedures: 4/9>> femoral HD catheter removed by IR.  Consults: Psychiatry Nephrology IR.  Subjective: Calm-cooperative this morning.  Tolerated dialysis well yesterday without any issues.  Allowed IR to removed right femoral catheter.  Spouse at bedside-acknowledges significant clinical improvement and wishes to take him home.  I have told her that patient needs to be reevaluated by psychiatry before we can plan for discharge.  Objective: Vitals: Blood pressure 126/77, pulse 67, temperature 98 F (36.7 C), temperature source Oral, resp. rate 20, height 5\' 11"  (1.803 m), weight 63.9 kg, SpO2 99%.   Exam: Gen Exam:Alert awake-not in any distress HEENT:atraumatic, normocephalic Chest: B/L clear to auscultation anteriorly CVS:S1S2 regular Abdomen:soft non tender, non distended Extremities:no edema Neurology: Non focal Skin: no rash  Pertinent Labs/Radiology:     Latest Ref Rng & Units 11/18/2023    4:20 AM 11/17/2023    4:28 AM 11/16/2023    4:24 AM  CBC  WBC 4.0 - 10.5 K/uL 4.5  4.8  6.6   Hemoglobin 13.0 - 17.0 g/dL 14.7  82.9  56.2   Hematocrit 39.0 - 52.0 % 36.5  36.8  39.5   Platelets 150 - 400 K/uL 123  117  107     Lab Results  Component Value Date   NA 137 11/18/2023   K 4.1 11/18/2023   CL 98 11/18/2023   CO2 24 11/18/2023      Assessment/Plan: Acute hypoxic respiratory failure Second due to decompensated HFrEF-in the setting of incomplete HD Resolved with dialysis-currently on room air.  Acute on chronic combined HFrEF/HFpEF Euvolemic-lying flat Volume removal with HD  ESRD on HD Per nephrology Refused HD on 4/8-but was calm/cooperative-and tolerated HD well on 4/10.  Acute toxic metabolic encephalopathy Secondary to missed HD/hypoxia/substance abuse Overall improved-awake/alert-not very cooperative for the past 2 days. Under IVC-cannot leave AMA-family cannot take patient See below.  Suicidal/homicidal ideation Per prior notes-patient threatening to kill himself by missing dialysis Under IVC-cannot sign out AMA Significant clinical improvement for the past 2 days-calm/cooperative-spouse inquiring about whether she can take patient home.  I have reached out to her psych team to see if patient can be cleared for discharge and IVC can be rescinded. Continue Abilify/Lexapro Continue as needed Zyprexa/lorazepam.  HTN BP stable Amlodipine/Coreg/hydralazine/Imdur  HLD Statin  BPH Flonax  Hx of VTE Hx of PAF Telemetry monitoring Eliquis  Thrombocytopenia Mild Follow CBC periodically  COPD No wheezing Bronchodilators  History of anxiety/depression  Remains on Lexapro/Abilify Psychiatry following-see above.  Other issue's At spouse's request-TOC team trying to arrange [facility]  BMI: Estimated body mass index is 19.65 kg/m as calculated from the following:   Height as of this  encounter: 5\' 11"  (1.803 m).   Weight as of this encounter: 63.9 kg.   Code status:   Code Status: Full Code   DVT Prophylaxis: SCDs Start: 11/16/23 0416 apixaban (ELIQUIS) tablet 5 mg     Family Communication: Spouse at bedside   Disposition Plan: Status is: Inpatient Remains inpatient appropriate because: Severity of illness   Planned Discharge Destination: [facility]   Diet: Diet Order             Diet regular Room service appropriate? Yes; Fluid consistency: Thin  Diet effective now                     Antimicrobial agents: Anti-infectives (From admission, onward)    None        MEDICATIONS: Scheduled Meds:  (feeding supplement) PROSource Plus  30 mL Oral BID BM   amLODipine  10 mg Oral Daily   apixaban  5 mg Oral BID   arformoterol  15 mcg Nebulization BID   And   umeclidinium bromide  1 puff Inhalation Daily   ARIPiprazole  5 mg Oral Daily   calcitRIOL  2 mcg Oral Q T,Th,Sa-HD   carvedilol  12.5 mg Oral BID WC   Chlorhexidine Gluconate Cloth  6 each Topical Q0600   escitalopram  10 mg Oral Daily   furosemide  40 mg Intravenous BID   hydrALAZINE  25 mg Oral BID   isosorbide mononitrate  30 mg Oral Daily   rosuvastatin  10 mg Oral Daily   sevelamer carbonate  1,600 mg Oral TID WC   tamsulosin  0.4 mg Oral QHS   Continuous Infusions: PRN Meds:.acetaminophen **OR** acetaminophen, albuterol, LORazepam **OR** LORazepam, melatonin, OLANZapine zydis **OR** OLANZapine, ondansetron (ZOFRAN) IV, oxyCODONE-acetaminophen   I have personally reviewed following labs and imaging studies  LABORATORY DATA: CBC: Recent Labs  Lab 11/15/23 0318 11/15/23 1130 11/16/23 0301 11/16/23 0424 11/17/23 0428 11/18/23 0420  WBC 5.6 5.8  --  6.6 4.8 4.5  NEUTROABS  --  3.5  --  4.5  --   --   HGB 12.1* 12.5* 14.3 12.7* 12.1* 11.8*  HCT 37.7* 39.1 42.0 39.5 36.8* 36.5*  MCV 86.3 85.9  --  85.7 83.1 83.3  PLT 111* 143*  --  107* 117* 123*    Basic  Metabolic Panel: Recent Labs  Lab 11/15/23 0318 11/15/23 1130 11/16/23 0301 11/16/23 0424 11/17/23 0428 11/18/23 0420  NA 137 135 134* 134* 134* 137  K 3.7 4.6 3.7 3.9 3.8 4.1  CL 97* 98  --  97* 97* 98  CO2 22 20*  --  24 22 24   GLUCOSE 140* 151*  --  108* 122* 131*  BUN 46* 50*  --  34* 45* 51*  CREATININE 10.03* 10.20*  --  7.16* 8.45* 9.46*  CALCIUM 9.5 9.5  --  8.9 9.0 8.8*  MG  --   --   --  2.4  --   --   PHOS  --   --   --  3.9  --   --     GFR: Estimated Creatinine Clearance: 6.9 mL/min (A) (by C-G formula based on SCr of 9.46 mg/dL (H)).  Liver Function Tests: Recent Labs  Lab 11/15/23  6045 11/15/23 1130 11/16/23 0424  AST 21 21 21   ALT 10 10 11   ALKPHOS 122 114 80  BILITOT 0.9 0.8 0.6  PROT 7.4 7.2 6.9  ALBUMIN 3.3* 3.4* 3.1*   Recent Labs  Lab 11/15/23 0318  LIPASE 30   No results for input(s): "AMMONIA" in the last 168 hours.  Coagulation Profile: No results for input(s): "INR", "PROTIME" in the last 168 hours.  Cardiac Enzymes: No results for input(s): "CKTOTAL", "CKMB", "CKMBINDEX", "TROPONINI" in the last 168 hours.  BNP (last 3 results) No results for input(s): "PROBNP" in the last 8760 hours.  Lipid Profile: No results for input(s): "CHOL", "HDL", "LDLCALC", "TRIG", "CHOLHDL", "LDLDIRECT" in the last 72 hours.  Thyroid Function Tests: No results for input(s): "TSH", "T4TOTAL", "FREET4", "T3FREE", "THYROIDAB" in the last 72 hours.  Anemia Panel: No results for input(s): "VITAMINB12", "FOLATE", "FERRITIN", "TIBC", "IRON", "RETICCTPCT" in the last 72 hours.  Urine analysis:    Component Value Date/Time   COLORURINE YELLOW 08/30/2023 1201   APPEARANCEUR CLEAR 08/30/2023 1201   LABSPEC 1.018 08/30/2023 1201   PHURINE 7.0 08/30/2023 1201   GLUCOSEU NEGATIVE 08/30/2023 1201   HGBUR SMALL (A) 08/30/2023 1201   BILIRUBINUR NEGATIVE 08/30/2023 1201   KETONESUR NEGATIVE 08/30/2023 1201   PROTEINUR 100 (A) 08/30/2023 1201   UROBILINOGEN  1.0 05/20/2013 0532   NITRITE NEGATIVE 08/30/2023 1201   LEUKOCYTESUR NEGATIVE 08/30/2023 1201    Sepsis Labs: Lactic Acid, Venous    Component Value Date/Time   LATICACIDVEN 0.8 04/30/2022 1051    MICROBIOLOGY: No results found for this or any previous visit (from the past 240 hours).  RADIOLOGY STUDIES/RESULTS: IR Removal Tun Cv Cath W/O FL Result Date: 11/18/2023 INDICATION: Request removal of tunneled right femoral hemodialysis catheter. This was placed at outside facility. Catheter was partially dislodged. EXAM: REMOVAL OF TUNNELED RIGHT COMMON FEMORAL HEMODIALYSIS CATHETER MEDICATIONS: None COMPLICATIONS: None immediate. PROCEDURE: Informed written consent was obtained from the patient following an explanation of the procedure, risks, benefits and alternatives to treatment. A time out was performed prior to the initiation of the procedure. Maximal barrier sterile technique was utilized including caps, mask, sterile gowns, sterile gloves, large sterile drape, hand hygiene, and chlorhexidine. The catheter was removed intact without difficulty. Hemostasis was obtained with manual compression. A dressing was placed. The patient tolerated the procedure well without immediate post procedural complication. IMPRESSION: Successful removal of tunneled right common femoral hemodialysis catheter. Procedure performed by Brayton El PA-C and supervised by Dr. Richarda Overlie Electronically Signed   By: Richarda Overlie M.D.   On: 11/18/2023 11:39     LOS: 3 days   Jeoffrey Massed, MD  [facility]    To contact the attending provider between 7A-7P or the covering provider during after hours 7P-7A, please log into the web site [email or web address] and access using universal [place] password for that web site. If you do not have the password, please call the hospital operator.  11/19/2023, 10:22 AM

## 2023-11-19 NOTE — Plan of Care (Signed)
  Problem: Health Behavior/Discharge Planning: Goal: Ability to manage health-related needs will improve Outcome: Progressing   Problem: Elimination: Goal: Will not experience complications related to bowel motility Outcome: Progressing Goal: Will not experience complications related to urinary retention Outcome: Progressing   

## 2023-11-19 NOTE — Evaluation (Signed)
 Physical Therapy Evaluation Patient Details Name: Ronald Reeves MRN: [id number] DOB: 16-Feb-1956 Today's Date: 11/19/2023  History of Present Illness  68 y.o. 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 He was evaluated in the ED and left AGAINST MEDICAL ADVICE. He subsequently was brought back to the ED the same day-after threatening to kill himself-patient was IVC by the familyPMHx: CAD on HD, PAF, chronic HFrEF, CVA, VTE on Eliquis, polysubstance abuse (cocaine/THC).   Clinical Impression  Pt admitted with above diagnosis. Reports feeling weaker than baseline but has had a couple of recent falls at home. Uses rollator and SPC at home PTA. Today pt required CGA for transfer with RW and Min assist to ambulate. He was fairly lethargic for most of session but engages with encouragement, more alert towards end of session while sitting up in recliner. He had 2/3 dyspnea which he states is abnormal from baseline, SpO2 92% after returning to chair from walking in hallway. SBP in 120s while sitting. Anticipate function will continue to improve with medical treatment (HD.) Will update recs as he progresses. Pt currently with functional limitations due to the deficits listed below (see PT Problem List). Pt will benefit from acute skilled PT to increase their independence and safety with mobility to allow discharge.           If plan is discharge home, recommend the following: A little help with walking and/or transfers;A little help with bathing/dressing/bathroom;Assistance with cooking/housework;Supervision due to cognitive status;Help with stairs or ramp for entrance;Direct supervision/assist for medications management;Direct supervision/assist for financial management;Assist for transportation   Can travel by private vehicle        Equipment Recommendations None recommended by PT  Recommendations for Other Services       Functional Status  Assessment Patient has had a recent decline in their functional status and demonstrates the ability to make significant improvements in function in a reasonable and predictable amount of time.     Precautions / Restrictions Precautions Precautions: Fall Recall of Precautions/Restrictions: Impaired Restrictions Weight Bearing Restrictions Per Provider Order: No      Mobility  Bed Mobility Overal bed mobility: Needs Assistance Bed Mobility: Supine to Sit     Supine to sit: Supervision     General bed mobility comments: Supervision for safety, no physical assist. Extra time    Transfers Overall transfer level: Needs assistance Equipment used: Rolling walker (2 wheels) Transfers: Sit to/from Stand Sit to Stand: Contact guard assist           General transfer comment: CGA for safety when rising from bed, strongly bracing legs against bed frame for support, leaning posteriorly, able to correct with RW for support and cues for technique.    Ambulation/Gait Ambulation/Gait assistance: Min assist Gait Distance (Feet): 100 Feet Assistive device: Rolling walker (2 wheels) Gait Pattern/deviations: Step-through pattern, Decreased stride length, Drifts right/left Gait velocity: dec Gait velocity interpretation: <1.8 ft/sec, indicate of risk for recurrent falls   General Gait Details: Min assist intermittently for RW control and stability, showing drift in each direction but no overt LOB or buckling during bout. Cues to keep eyes open on occasion, for awareness, and safety with RW support. 2/3 dyspnea SpO2 92% after sitting down in room. States he feels weaker than baseline.  Stairs            Wheelchair Mobility     Tilt Bed    Modified Rankin (Stroke Patients Only)  Balance Overall balance assessment: Needs assistance Sitting-balance support: No upper extremity supported, Feet supported Sitting balance-Leahy Scale: Fair     Standing balance support: Single  extremity supported, Reliant on assistive device for balance Standing balance-Leahy Scale: Poor                               Pertinent Vitals/Pain Pain Assessment Pain Assessment: No/denies pain    Home Living Family/patient expects to be discharged to:: Private residence Living Arrangements: Spouse/significant other Available Help at Discharge: Family;Available PRN/intermittently Type of Home: House Home Access: Ramped entrance       Home Layout: One level Home Equipment: Agricultural consultant (2 wheels);Rollator (4 wheels);Cane - single point;Shower seat;BSC/3in1      Prior Function Prior Level of Function : Needs assist;History of Falls (last six months)             Mobility Comments: Uses rollator primarily but occasionally will use SPC. Reports falls, denies injury       Extremity/Trunk Assessment   Upper Extremity Assessment Upper Extremity Assessment: Defer to OT evaluation    Lower Extremity Assessment Lower Extremity Assessment: Generalized weakness       Communication   Communication Communication: Impaired Factors Affecting Communication: Difficulty expressing self (soft sound/ mumbles at times)    Cognition Arousal: Lethargic Behavior During Therapy: WFL for tasks assessed/performed   PT - Cognitive impairments: Difficult to assess Difficult to assess due to: Level of arousal                     PT - Cognition Comments: Answers most questions appropriately, good memory of distant past. Easily doses off. Often needs asked several times before receiving an answer and speaks softly Following commands: Impaired Following commands impaired: Only follows one step commands consistently, Follows one step commands with increased time     Cueing Cueing Techniques: Verbal cues, Tactile cues     General Comments General comments (skin integrity, edema, etc.): SpO2 92% after ambulatory bout, sitting in chair, 2/3 dyspnea while  ambulating.    Exercises     Assessment/Plan    PT Assessment Patient needs continued PT services  PT Problem List Decreased strength;Decreased activity tolerance;Decreased balance;Decreased mobility;Decreased coordination;Decreased cognition;Decreased knowledge of use of DME;Decreased safety awareness;Decreased knowledge of precautions;Cardiopulmonary status limiting activity       PT Treatment Interventions DME instruction;Gait training;Functional mobility training;Therapeutic activities;Therapeutic exercise;Balance training;Neuromuscular re-education;Cognitive remediation;Patient/family education    PT Goals (Current goals can be found in the Care Plan section)  Acute Rehab PT Goals Patient Stated Goal: Feel better PT Goal Formulation: With patient Time For Goal Achievement: 12/03/23 Potential to Achieve Goals: Good    Frequency Min 2X/week     Co-evaluation               AM-PAC PT "6 Clicks" Mobility  Outcome Measure Help needed turning from your back to your side while in a flat bed without using bedrails?: A Little Help needed moving from lying on your back to sitting on the side of a flat bed without using bedrails?: A Little Help needed moving to and from a bed to a chair (including a wheelchair)?: A Little Help needed standing up from a chair using your arms (e.g., wheelchair or bedside chair)?: A Little Help needed to walk in hospital room?: A Little Help needed climbing 3-5 steps with a railing? : A Little 6 Click Score: 18  End of Session Equipment Utilized During Treatment: Gait belt Activity Tolerance: Patient tolerated treatment well Patient left: in chair;with call bell/phone within reach;with nursing/sitter in room (Sitters in room)   PT Visit Diagnosis: Unsteadiness on feet (R26.81);Other abnormalities of gait and mobility (R26.89);Repeated falls (R29.6);Muscle weakness (generalized) (M62.81);History of falling (Z91.81);Other symptoms and signs  involving the nervous system (R29.898)    Time: 4098-1191 PT Time Calculation (min) (ACUTE ONLY): 19 min   Charges:   PT Evaluation $PT Eval Low Complexity: 1 Low   PT General Charges $$ ACUTE PT VISIT: 1 Visit         Kathlyn Sacramento, PT, DPT [facility]  Rehabilitation Services Physical Therapist Office: [phone] Website: [email or web address]   Berton Mount 11/19/2023, 9:51 AM

## 2023-11-19 NOTE — TOC Transition Note (Signed)
 Transition of Care [facility]) - Discharge Note   Patient Details  Name: Ronald Reeves MRN: [id number] Date of Birth: 24-Aug-1955  [facility] CM/SW Contact:  Gordy Clement, RN Phone Number: 11/19/2023, 1:50 PM   Clinical Narrative:     Patient will now dc to home with Wife. [facility] PT and OT will be provided by [facility]. Wife will transport home  No additional TOC needs           Patient Goals and CMS Choice            Discharge Placement                       Discharge Plan and Services Additional resources added to the After Visit Summary for                                       Social Drivers of Health (SDOH) Interventions SDOH Screenings   Food Insecurity: No Food Insecurity (11/16/2023)  Housing: Low Risk  (11/16/2023)  Transportation Needs: Unmet Transportation Needs (11/16/2023)  Utilities: Not At Risk (11/16/2023)  Financial Resource Strain: Low Risk  (10/06/2023)   [facility] from [facility]  Social Connections: Moderately Integrated (11/16/2023)  Stress: No Stress Concern Present (05/22/2023)   Received from [facility]  Tobacco Use: High Risk (11/16/2023)     Readmission Risk Interventions     No data to display

## 2023-11-19 NOTE — TOC CM/SW Note (Addendum)
 Transition of Care [facility]) - Inpatient Brief Assessment   Patient Details  Name: Ronald Reeves MRN: [id number] Date of Birth: 10/22/1955  [facility] CM/SW Contact:    Mearl Latin, LCSW Phone Number: 11/19/2023, 9:13 AM   Clinical Narrative: 9am-CSW spoke with Tresa Endo, Transfer Coordinator at [place] [phone] x [phone]). CSW provided update on successful HD yesterday. CSW faxed updated records to her at f. [phone].   11:49 AM-Per MD and psych team, patient cleared for discharge home today and [facility]. CSW updated Tresa Endo and she provided contact for their [facility] social worker, [phone] x [phone]). CSW left her a voicemail.   Rescinding IVC; paperwork uploaded to [facility] (Case [id number], Envelope [id number]).     CSW received call from [place] SW at the [place] [phone]) and she will alert the team to the need for an OP Psych appointment.    Transition of Care Asessment: Insurance and Status: Insurance coverage has been reviewed Patient has primary care physician: Yes Home environment has been reviewed: from home Prior level of function:: Mod Independent Prior/Current Home Services: No current home services Social Drivers of Health Review: SDOH reviewed no interventions necessary Readmission risk has been reviewed: Yes Transition of care needs: transition of care needs identified, TOC will continue to follow

## 2023-11-19 NOTE — Progress Notes (Signed)
 D/C order noted. Contacted [facility] to be advised that pt will d/c today and should resume care on Saturday.   Olivia Canter Renal Navigator [phone]

## 2023-11-19 NOTE — Discharge Planning (Signed)
 [facility] Patient Discharge Orders - [facility] CLINIC: [place]  Patient's name: Ronald Reeves Admit/DC Dates: 11/15/2023 - 11/19/23  DISCHARGE DIAGNOSES: Suicidal ideation -> initially under IVCwith psych following, now improved and this was rescinded  Acute Hypoxic Resp Failure/pulm edema -> much better AMS - resolved  HD ORDER CHANGES: Heparin change: no EDW Change: YES New EDW: 63.5kg Bath Change: no  ANEMIA MANAGEMENT: Aranesp: Given: no  ESA dose for discharge: per protocol IV Iron dose at discharge: per protocol Transfusion: Given: no  BONE/MINERAL MEDICATIONS: Hectorol/Calcitriol change: no Sensipar/Parsabiv change: no  ACCESS INTERVENTION/CHANGE: YES - TDC removed 11/18/23, AVG used successfully with 16g needles on 11/18/23 Details:   RECENT LABS: Recent Labs  Lab 11/16/23 0424 11/17/23 0428 11/18/23 0420  HGB 12.7*   < > 11.8*  NA 134*   < > 137  K 3.9   < > 4.1  CALCIUM 8.9   < > 8.8*  PHOS 3.9  --   --   ALBUMIN 3.1*  --   --    < > = values in this interval not displayed.   IV ANTIBIOTICS: no Details:  OTHER ANTICOAGULATION: On Coumadin?: no On Eliquis  OTHER/APPTS/LAB ORDERS:  - Advance needle size with AVG per clinic protocol  D/C Meds to be reconciled by nurse after every discharge.  Completed By: Ozzie Hoyle, PA-C [facility] Pager [phone]   Reviewed by: MD:______ RN_______

## 2023-11-19 NOTE — Plan of Care (Signed)
  Problem: Education: Goal: Knowledge of General Education information will improve Description: Including pain rating scale, medication(s)/side effects and non-pharmacologic comfort measures Outcome: Completed/Met   Problem: Health Behavior/Discharge Planning: Goal: Ability to manage health-related needs will improve Outcome: Completed/Met   Problem: Clinical Measurements: Goal: Ability to maintain clinical measurements within normal limits will improve Outcome: Completed/Met Goal: Will remain free from infection Outcome: Completed/Met Goal: Diagnostic test results will improve Outcome: Completed/Met Goal: Respiratory complications will improve Outcome: Completed/Met Goal: Cardiovascular complication will be avoided Outcome: Completed/Met   Problem: Activity: Goal: Risk for activity intolerance will decrease Outcome: Completed/Met   Problem: Nutrition: Goal: Adequate nutrition will be maintained Outcome: Completed/Met   Problem: Coping: Goal: Level of anxiety will decrease Outcome: Completed/Met   Problem: Elimination: Goal: Will not experience complications related to bowel motility Outcome: Completed/Met Goal: Will not experience complications related to urinary retention Outcome: Completed/Met   Problem: Pain Managment: Goal: General experience of comfort will improve and/or be controlled Outcome: Completed/Met   Problem: Safety: Goal: Ability to remain free from injury will improve Outcome: Completed/Met   Problem: Skin Integrity: Goal: Risk for impaired skin integrity will decrease Outcome: Completed/Met   Problem: Safety: Goal: Non-violent Restraint(s) Outcome: Completed/Met

## 2023-11-19 NOTE — Progress Notes (Signed)
 [facility] Progress Note   Subjective:  Seen in room - sitting in recliner. Says feels well today. PT reports was winded with walking this AM. S/p HD yesterday - did fine and AVG was used without issue.   Objective Vitals:   11/18/23 2030 11/19/23 0500 11/19/23 0741 11/19/23 0830  BP: 135/77  126/77   Pulse: 70  67   Resp: 20     Temp: 98 F (36.7 C)  97.8 F (36.6 C)   TempSrc: Oral  Oral   SpO2: 99%  96% 99%  Weight:  63.9 kg    Height:       Physical Exam General: Frail appearing man, NAD. Room air. Heart: RRR; 2/6 murmur Lungs: Bibasilar rales; clear in upper lobes Extremities: no LE edema Dialysis Access: RUE AVG +t/b  Additional Objective Labs: Basic Metabolic Panel: Recent Labs  Lab 11/16/23 0424 11/17/23 0428 11/18/23 0420  NA 134* 134* 137  K 3.9 3.8 4.1  CL 97* 97* 98  CO2 24 22 24   GLUCOSE 108* 122* 131*  BUN 34* 45* 51*  CREATININE 7.16* 8.45* 9.46*  CALCIUM 8.9 9.0 8.8*  PHOS 3.9  --   --    Liver Function Tests: Recent Labs  Lab 11/15/23 0318 11/15/23 1130 11/16/23 0424  AST 21 21 21   ALT 10 10 11   ALKPHOS 122 114 80  BILITOT 0.9 0.8 0.6  PROT 7.4 7.2 6.9  ALBUMIN 3.3* 3.4* 3.1*   Recent Labs  Lab 11/15/23 0318  LIPASE 30   CBC: Recent Labs  Lab 11/15/23 0318 11/15/23 1130 11/16/23 0301 11/16/23 0424 11/17/23 0428 11/18/23 0420  WBC 5.6 5.8  --  6.6 4.8 4.5  NEUTROABS  --  3.5  --  4.5  --   --   HGB 12.1* 12.5*   < > 12.7* 12.1* 11.8*  HCT 37.7* 39.1   < > 39.5 36.8* 36.5*  MCV 86.3 85.9  --  85.7 83.1 83.3  PLT 111* 143*  --  107* 117* 123*   < > = values in this interval not displayed.   Studies/Results: IR Removal Tun Cv Cath W/O FL Result Date: 11/18/2023 INDICATION: Request removal of tunneled right femoral hemodialysis catheter. This was placed at outside facility. Catheter was partially dislodged. EXAM: REMOVAL OF TUNNELED RIGHT COMMON FEMORAL HEMODIALYSIS CATHETER MEDICATIONS: None COMPLICATIONS:  None immediate. PROCEDURE: Informed written consent was obtained from the patient following an explanation of the procedure, risks, benefits and alternatives to treatment. A time out was performed prior to the initiation of the procedure. Maximal barrier sterile technique was utilized including caps, mask, sterile gowns, sterile gloves, large sterile drape, hand hygiene, and chlorhexidine. The catheter was removed intact without difficulty. Hemostasis was obtained with manual compression. A dressing was placed. The patient tolerated the procedure well without immediate post procedural complication. IMPRESSION: Successful removal of tunneled right common femoral hemodialysis catheter. Procedure performed by Brayton El PA-C and supervised by Dr. Richarda Overlie Electronically Signed   By: Richarda Overlie M.D.   On: 11/18/2023 11:39   Medications:   (feeding supplement) PROSource Plus  30 mL Oral BID BM   amLODipine  10 mg Oral Daily   apixaban  5 mg Oral BID   arformoterol  15 mcg Nebulization BID   And   umeclidinium bromide  1 puff Inhalation Daily   ARIPiprazole  5 mg Oral Daily   calcitRIOL  2 mcg Oral Q T,Th,Sa-HD   carvedilol  12.5 mg Oral BID  WC   Chlorhexidine Gluconate Cloth  6 each Topical Q0600   escitalopram  10 mg Oral Daily   furosemide  40 mg Intravenous BID   hydrALAZINE  25 mg Oral BID   isosorbide mononitrate  30 mg Oral Daily   rosuvastatin  10 mg Oral Daily   sevelamer carbonate  1,600 mg Oral TID WC   tamsulosin  0.4 mg Oral QHS   Dialysis Orders TTS - NW 4hr, 400/A1.5, EDW 64kg, 2K/2Ca, TDC + RUE AVG, heparin 2500units q HD - no ESA, Hgb typically >11 - Calcitriol PO q HD   Assessment/Plan: IVC/suicidal ideation: Sitter in place, variable mood - refusing everything 4/8 including HD, now agreeable again. Acute Hypoxic Resp Failure/pulm edema: S/p HD overnight 4/6, then 4/9 - much better. ESRD: Usual TTS schedule -> refused HD 4/8, refused AVG cannulation, nearly pulled  his TDC out at some point over night, IR later removed completely. Able to dialyze on 4/9 using AVG without issues - he was calm the whole treatment. Will need to get him back on usual TTS schedule. Still has some rales on exam - offered extra HD tomorrow - he is not really interested. Tentatively plan for next HD to be Saturday 4/12. HTN/volume: BP ok, edema has resolved. UF as tolerated, will need lower EDW on discharge. 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 Nutrition: Alb low, continue supps. 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

## 2023-11-19 NOTE — Consult Note (Signed)
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 INTERVENTION;  Surgeon: Victorino Sparrow, MD;  Location: [facility] INVASIVE CV LAB;  Service: Cardiovascular;;   POLYPECTOMY  08/20/2022   Procedure: POLYPECTOMY;  Surgeon: Shellia Cleverly, DO;  Location: [facility] ENDOSCOPY;  Service: Gastroenterology;;   REVISON OF ARTERIOVENOUS FISTULA Left 07/15/2021   Procedure: REVISON OF LEFT ARTERIOVENOUS FISTULA;  Surgeon: Cephus Shelling, MD;  Location: [facility] OR;  Service: Vascular;  Laterality: Left;   REVISON OF ARTERIOVENOUS FISTULA Left 05/01/2022   Procedure: ARTERIOVENOUS FISTULA WASHOUT OF ARM HEMATOMA;  Surgeon: [name], MD;  Location: [facility] OR;  Service: Vascular;  Laterality: Left;   TUNNELLED CATHETER EXCHANGE N/A 10/23/2023   Procedure: TUNNELLED CATHETER EXCHANGE;  Surgeon: Tyler Pita, MD;  Location: [facility] INVASIVE CV LAB;  Service: Cardiovascular;  Laterality: N/A;   UPPER EXTREMITY VENOGRAPHY Bilateral 08/28/2023   Procedure: UPPER EXTREMITY VENOGRAPHY;  Surgeon: [name], MD;  Location: [facility] INVASIVE CV LAB;  Service: Cardiovascular;  Laterality: Bilateral;     Medications:   Current  Facility-Administered Medications:    (feeding supplement) PROSource Plus liquid 30 mL, 30 mL, Oral, BID BM, Stovall, Kathryn R, PA-C   acetaminophen (TYLENOL) tablet 650 mg, 650 mg, Oral, Q6H PRN **OR** acetaminophen (TYLENOL) suppository 650 mg, 650 mg, Rectal, Q6H PRN, Howerter, Justin B, DO   albuterol (PROVENTIL) (2.5 MG/3ML) 0.083% nebulizer solution 2.5 mg, 2.5 mg, Nebulization, Q6H PRN, Adela Lank, Dan, DO, 2.5 mg at 11/15/23 1655   amLODipine (NORVASC) tablet 10 mg, 10 mg, Oral, Daily, Adela Lank, Dan, DO, 10 mg at 11/19/23 0959   apixaban (ELIQUIS) tablet 5 mg, 5 mg, Oral, BID, Adela Lank, Dan, DO, 5 mg at 11/19/23 0959   arformoterol (BROVANA) nebulizer solution 15 mcg, 15 mcg, Nebulization, BID, 15 mcg at 11/19/23 0828 **AND** umeclidinium bromide (INCRUSE ELLIPTA)  62.5 MCG/ACT 1 puff, 1 puff, Inhalation, Daily, Melene Plan, DO, 1 puff at 11/19/23 0834   ARIPiprazole (ABILIFY) tablet 5 mg, 5 mg, Oral, Daily, Rex Kras, MD, 5 mg at 11/19/23 1610   calcitRIOL (ROCALTROL) capsule 2 mcg, 2 mcg, Oral, Q T,Th,Sa-HD, Stovall, Kathryn R, PA-C   carvedilol (COREG) tablet 12.5 mg, 12.5 mg, Oral, BID WC, Adela Lank, Dan, DO, 12.5 mg at 11/19/23 1000   Chlorhexidine Gluconate Cloth 2 % PADS 6 each, 6 each, Topical, Q0600, Delano Metz, MD, 6 each at 11/19/23 1001   escitalopram (LEXAPRO) tablet 10 mg, 10 mg, Oral, Daily, Elgergawy, Leana Roe, MD, 10 mg at 11/19/23 1001   furosemide (LASIX) injection 40 mg, 40 mg, Intravenous, BID, Howerter, Justin B, DO, 40 mg at 11/19/23 1000   hydrALAZINE (APRESOLINE) tablet 25 mg, 25 mg, Oral, BID, Adela Lank, Dan, DO, 25 mg at 11/19/23 9604   isosorbide mononitrate (IMDUR) 24 hr tablet 30 mg, 30 mg, Oral, Daily, Adela Lank, Dan, DO, 30 mg at 11/19/23 0959   LORazepam (ATIVAN) tablet 1 mg, 1 mg, Oral, Q6H PRN, 1 mg at 11/18/23 2141 **OR** LORazepam (ATIVAN) injection 1 mg, 1 mg, Intramuscular, Q6H PRN, Rex Kras, MD   melatonin tablet 3 mg, 3 mg, Oral, QHS PRN, Howerter, Justin  B, DO, 3 mg at 11/18/23 2141   OLANZapine zydis (ZYPREXA) disintegrating tablet 5 mg, 5 mg, Oral, QID PRN **OR** OLANZapine (ZYPREXA) injection 5 mg, 5 mg, Intramuscular, QID PRN, Rex Kras, MD   ondansetron (ZOFRAN) injection 4 mg, 4 mg, Intravenous, Q6H PRN, Howerter, Justin B, DO, 4 mg at 11/18/23 2303   oxyCODONE-acetaminophen (PERCOCET/ROXICET) 5-325 MG per tablet 1 tablet, 1 tablet, Oral, Q4H PRN, Adela Lank, Dan, DO   rosuvastatin (CRESTOR) tablet 10 mg, 10 mg, Oral, Daily, Adela Lank, Dan, DO, 10 mg at 11/19/23 1001   sevelamer carbonate (RENVELA) tablet 1,600 mg, 1,600 mg, Oral, TID WC, Melene Plan, DO, 1,600 mg at 11/19/23 1000   tamsulosin (FLOMAX) capsule 0.4 mg, 0.4 mg, Oral, QHS, Floyd, Dan, DO, 0.4 mg at 11/18/23 2140  Allergies: Allergies  Allergen Reactions   Enalapril Other (See Comments)    Renal failure syndrome   Gabapentin     Encephalopathy, tremor   Iodinated Contrast Media Nausea And Vomiting    Lorri Frederick, MD

## 2023-11-19 NOTE — Evaluation (Signed)
 Occupational Therapy Evaluation Patient Details Name: Ronald Reeves MRN: [id number] DOB: October 01, 1955 Today's Date: 11/19/2023   History of Present Illness   68 y.o. 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 He was evaluated in the ED and left AGAINST MEDICAL ADVICE. He subsequently was brought back to the ED the same day-after threatening to kill himself-patient was IVC by the familyPMHx: CAD on HD, PAF, chronic HFrEF, CVA, VTE on Eliquis, polysubstance abuse (cocaine/THC).     Clinical Impressions Pt lethargic, c/o no pain, started to become more alert towards the end of the session. Pt lives with wife who works during the day, 1 story house with ramped entrance, PLOF wife has been helping with LB ADLs, history of 10+ falls in the last few months, uses cane around home, mod I for UB ADLs. Pt currently presents with poor balance, reliant on RW for support, educated on safety awareness and use of RW rather than cane for extra support. Pt has had difficulty feeding, not able to hold onto spoon as well, stiffness in B fingers and poor fine motor skills, good overall gross strength. Pt mod A for LB dressing, poor balance when standing with frequent LOB requiring mod A to maintain standing without using RW. Recommending HHOT follow up to maximize safety and independence with ADLs, improve strength, will continue to see acutely to progress as able.      If plan is discharge home, recommend the following:   A lot of help with walking and/or transfers;A little help with bathing/dressing/bathroom;Assistance with cooking/housework;Assist for transportation;Help with stairs or ramp for entrance     Functional Status Assessment   Patient has had a recent decline in their functional status and demonstrates the ability to make significant improvements in function in a reasonable and predictable amount of time.     Equipment Recommendations   None  recommended by OT     Recommendations for Other Services         Precautions/Restrictions   Precautions Precautions: Fall Recall of Precautions/Restrictions: Impaired Restrictions Weight Bearing Restrictions Per Provider Order: No     Mobility Bed Mobility Overal bed mobility: Needs Assistance Bed Mobility: Supine to Sit     Supine to sit: Supervision     General bed mobility comments: Supervision for safety, no physical assist. Extra time    Transfers Overall transfer level: Needs assistance Equipment used: Rolling walker (2 wheels) Transfers: Sit to/from Stand Sit to Stand: Min assist           General transfer comment: Pt min A to maintain balance standing at bedside, started to lean to L/R and lose balance.      Balance Overall balance assessment: Needs assistance Sitting-balance support: No upper extremity supported, Feet supported Sitting balance-Leahy Scale: Fair     Standing balance support: Single extremity supported, Reliant on assistive device for balance Standing balance-Leahy Scale: Poor Standing balance comment: reliant on support                           ADL either performed or assessed with clinical judgement   ADL Overall ADL's : Needs assistance/impaired Eating/Feeding: Set up;Minimal assistance;Sitting   Grooming: Set up;Minimal assistance;Sitting   Upper Body Bathing: Contact guard assist;Minimal assistance;Sitting   Lower Body Bathing: Moderate assistance;Sitting/lateral leans;Sit to/from stand   Upper Body Dressing : Minimal assistance;Sitting   Lower Body Dressing: Moderate assistance;Sitting/lateral leans;Sit to/from stand   Toilet  Transfer: Moderate assistance;Rolling walker (2 wheels)   Toileting- Clothing Manipulation and Hygiene: Minimal assistance;Sit to/from stand       Functional mobility during ADLs: Moderate assistance;Rolling walker (2 wheels)       Vision Baseline Vision/History: 1 Wears  glasses Ability to See in Adequate Light: 0 Adequate Patient Visual Report: No change from baseline       Perception         Praxis         Pertinent Vitals/Pain Pain Assessment Pain Assessment: No/denies pain     Extremity/Trunk Assessment Upper Extremity Assessment Upper Extremity Assessment: RUE deficits/detail;LUE deficits/detail RUE Deficits / Details: Generalized weakness, B shoulder stiffness, history of RTC tears, difficulty feeding due to stiffness in B hands RUE: Shoulder pain with ROM RUE Sensation: WNL RUE Coordination: decreased fine motor LUE Deficits / Details: Generalized weakness, B shoulder stiffness, history of RTC tears, difficulty feeding due to stiffness in B hands LUE: Shoulder pain with ROM LUE Sensation: WNL LUE Coordination: decreased fine motor   Lower Extremity Assessment Lower Extremity Assessment: Defer to PT evaluation       Communication Communication Communication: Impaired Factors Affecting Communication: Difficulty expressing self;Reduced clarity of speech   Cognition Arousal: Lethargic Behavior During Therapy: WFL for tasks assessed/performed Cognition: No apparent impairments                               Following commands: Impaired Following commands impaired: Only follows one step commands consistently, Follows one step commands with increased time     Cueing  General Comments   Cueing Techniques: Verbal cues;Tactile cues      Exercises     Shoulder Instructions      Home Living Family/patient expects to be discharged to:: Private residence Living Arrangements: Spouse/significant other Available Help at Discharge: Family;Available PRN/intermittently Type of Home: House Home Access: Ramped entrance     Home Layout: One level     Bathroom Shower/Tub: Chief Strategy Officer: Standard Bathroom Accessibility: Yes   Home Equipment: Agricultural consultant (2 wheels);Rollator (4 wheels);Cane -  single point;Shower seat;BSC/3in1   Additional Comments: Pt lives with wife who works during the day      Prior Functioning/Environment Prior Level of Function : Needs assist;History of Falls (last six months)             Mobility Comments: Uses rollator primarily but occasionally will use SPC. Reports falls, denies injury ADLs Comments: Pt reports he is Independent to Mod I with ADLs at baseline. Pt reports wife has assisted with LB dressing recently due to dizziness and lightheadedness when bending forward.    OT Problem List: Decreased strength;Decreased range of motion;Decreased activity tolerance;Impaired balance (sitting and/or standing);Decreased safety awareness;Impaired UE functional use   OT Treatment/Interventions: Self-care/ADL training;Therapeutic exercise;Energy conservation;DME and/or AE instruction;Therapeutic activities;Patient/family education;Balance training      OT Goals(Current goals can be found in the care plan section)   Acute Rehab OT Goals Patient Stated Goal: to return home OT Goal Formulation: With patient Time For Goal Achievement: 12/03/23 Potential to Achieve Goals: Good   OT Frequency:  Min 2X/week    Co-evaluation              AM-PAC OT "6 Clicks" Daily Activity     Outcome Measure Help from another person eating meals?: A Little Help from another person taking care of personal grooming?: A Little Help from another person toileting, which includes  using toliet, bedpan, or urinal?: A Lot Help from another person bathing (including washing, rinsing, drying)?: A Lot Help from another person to put on and taking off regular upper body clothing?: A Little Help from another person to put on and taking off regular lower body clothing?: A Lot 6 Click Score: 15   End of Session Equipment Utilized During Treatment: Gait belt Nurse Communication: Mobility status  Activity Tolerance: Patient tolerated treatment well Patient left: in  bed;with call bell/phone within reach;with nursing/sitter in room  OT Visit Diagnosis: Unsteadiness on feet (R26.81);Other abnormalities of gait and mobility (R26.89);Repeated falls (R29.6);Muscle weakness (generalized) (M62.81);History of falling (Z91.81)                Time: 1610-9604 OT Time Calculation (min): 26 min Charges:  OT General Charges $OT Visit: 1 Visit OT Evaluation $OT Eval Moderate Complexity: 1 Mod OT Treatments $Self Care/Home Management : 8-22 mins  [address] OTR/L   Alexis Goodell 11/19/2023, 1:18 PM

## 2023-11-19 NOTE — Discharge Summary (Signed)
 PATIENT DETAILS Name: Ronald Reeves Age: 68 y.o. Sex: male Date of Birth: May 11, 1956 MRN: [id number]. Admitting Physician: Angie Fava, DO WUJ:[id number], Rosalita Chessman, MD  Admit Date: 11/15/2023 Discharge date: 11/19/2023  Recommendations for Outpatient Follow-up:  Follow up with PCP in 1-2 weeks Please obtain CMP/CBC in one week  Admitted From:  Home  Disposition: Home   Discharge Condition: good  CODE STATUS:   Code Status: Full Code   Diet recommendation:  Diet Order             Diet - low sodium heart healthy           Diet regular Room service appropriate? Yes; Fluid consistency: Thin  Diet effective now                    Brief Summary: Patient is a 68 y.o.  male with history of CAD on HD, PAF, chronic HFrEF, CVA, VTE on Eliquis, polysubstance abuse (cocaine/THC)-presented on 4/6 with vomiting-unable to complete dialysis x 3 weeks and worsening lower extremity edema.  He was evaluated in the ED-and left AGAINST MEDICAL ADVICE.  He subsequently was brought back to the ED the same day-after threatening to kill himself-patient was IVC by the family and subsequently admitted to [facility] service.     Significant events: 4/6>> admit to [facility]-after leaving ED AMA-brought back by family-threatening to kill himself-IVC by family.   Significant studies: 4/6>> CT head: No acute intracranial process 4/6>> CXR: Interstitial edema-left basilar airspace disease-likely reflects atelectasis.   Significant microbiology data: None   Procedures: 4/9>> femoral HD catheter removed by IR.   Consults: Psychiatry Nephrology IR.  Brief Hospital Course: Acute hypoxic respiratory failure Second due to decompensated HFrEF-in the setting of incomplete HD Resolved with dialysis-currently on room air.   Acute on chronic combined HFrEF/HFpEF Euvolemic-lying flat Volume removal with HD   ESRD on HD Per nephrology Refused HD on 4/8-but was calm/cooperative-and tolerated HD  well on 4/10.   Acute toxic metabolic encephalopathy Secondary to missed HD/hypoxia/substance abuse Overall improved-awake/alert- very cooperative for the past 2 days. Initially under IVC but now clinically improved-discussed with psychiatric team-they will rescind the IVC.  Okay for the charge from psych point of view.   Suicidal/homicidal ideation Per prior notes-patient threatening to kill himself by missing dialysis Initially under IVC but now clinically improved-discussed with psychiatric team-they will rescind the IVC.  Okay for the charge from psych point of view. Discussed with psychiatry-continue Abilify/Lexapro on discharge Outpatient follow-up with psychiatry at the [facility] system.    HTN BP stable Amlodipine/Coreg/hydralazine/Imdur   HLD Statin   BPH Flonax   Hx of VTE Hx of PAF Telemetry monitoring Eliquis   Thrombocytopenia Mild Follow CBC periodically   COPD No wheezing Bronchodilators   History of anxiety/depression Remains on Lexapro/Abilify See above.   Other issue's At spouse's request-TOC team did reach out to the [place] system for transfer-but now he is clinically improved-spouse wants to take him home-IVC has been rescinded by psychiatry.  Does not require transfer to the [place] system.   BMI: Estimated body mass index is 19.65 kg/m as calculated from the following:   Height as of this encounter: 5\' 11"  (1.803 m).   Weight as of this encounter: 63.9 kg.    Discharge Diagnoses:  Principal Problem:   Acute on chronic systolic (congestive) heart failure (HCC) Active Problems:   Essential hypertension   SOB (shortness of breath)   HLD (hyperlipidemia)   ESRD (end stage renal disease) (  HCC)   Acute hypoxic respiratory failure (HCC)   Suicidal ideation   History of depression   Paroxysmal atrial fibrillation [facility])   Discharge Instructions:  Activity:  As tolerated with Full fall precautions use walker/cane & assistance as needed   Discharge  Instructions     Call MD for:  difficulty breathing, headache or visual disturbances   Complete by: As directed    Call MD for:  extreme fatigue   Complete by: As directed    Call MD for:  persistant dizziness or light-headedness   Complete by: As directed    Diet - low sodium heart healthy   Complete by: As directed    Discharge instructions   Complete by: As directed    Follow with Primary MD  Benjiman Core, MD in 1-2 weeks  Follow up with your hemodialysis clinic at your usual schedule.  Follow-up with psychiatry at the [facility] system.  Please get a complete blood count and chemistry panel checked by your Primary MD at your next visit, and again as instructed by your Primary MD.  Get Medicines reviewed and adjusted: Please take all your medications with you for your next visit with your Primary MD  Laboratory/radiological data: Please request your Primary MD to go over all hospital tests and procedure/radiological results at the follow up, please ask your Primary MD to get all Hospital records sent to his/her office.  In some cases, they will be blood work, cultures and biopsy results pending at the time of your discharge. Please request that your primary care M.D. follows up on these results.  Also Note the following: If you experience worsening of your admission symptoms, develop shortness of breath, life threatening emergency, suicidal or homicidal thoughts you must seek medical attention immediately by calling 911 or calling your MD immediately  if symptoms less severe.  You must read complete instructions/literature along with all the possible adverse reactions/side effects for all the Medicines you take and that have been prescribed to you. Take any new Medicines after you have completely understood and accpet all the possible adverse reactions/side effects.   Do not drive when taking Pain medications or sleeping medications (Benzodaizepines)  Do not take more than  prescribed Pain, Sleep and Anxiety Medications. It is not advisable to combine anxiety,sleep and pain medications without talking with your primary care practitioner  Special Instructions: If you have smoked or chewed Tobacco  in the last 2 yrs please stop smoking, stop any regular Alcohol  and or any Recreational drug use.  Wear Seat belts while driving.  Please note: You were cared for by a hospitalist during your hospital stay. Once you are discharged, your primary care physician will handle any further medical issues. Please note that NO REFILLS for any discharge medications will be authorized once you are discharged, as it is imperative that you return to your primary care physician (or establish a relationship with a primary care physician if you do not have one) for your post hospital discharge needs so that they can reassess your need for medications and monitor your lab values.   Increase activity slowly   Complete by: As directed       Allergies as of 11/19/2023       Reactions   Enalapril Other (See Comments)   Renal failure syndrome   Gabapentin    Encephalopathy, tremor   Iodinated Contrast Media Nausea And Vomiting        Medication List     STOP taking  these medications    mirtazapine 30 MG tablet Commonly known as: REMERON   pantoprazole 40 MG tablet Commonly known as: PROTONIX       TAKE these medications    albuterol 108 (90 Base) MCG/ACT inhaler Commonly known as: VENTOLIN HFA Inhale 2 puffs into the lungs every 6 (six) hours as needed (COPD).   amLODipine 10 MG tablet Commonly known as: NORVASC Take 1 tablet (10 mg total) by mouth daily.   apixaban 5 MG Tabs tablet Commonly known as: ELIQUIS Take 1 tablet (5 mg total) by mouth 2 (two) times daily.   ARIPiprazole 5 MG tablet Commonly known as: ABILIFY Take 1 tablet (5 mg total) by mouth daily. Start taking on: November 20, 2023   carvedilol 12.5 MG tablet Commonly known as: COREG Take 12.5 mg  by mouth 2 (two) times daily with a meal.   DIALYVITE TABLET Tabs Take 1 tablet by mouth daily.   escitalopram 20 MG tablet Commonly known as: LEXAPRO Take 0.5 tablets (10 mg total) by mouth at bedtime. What changed: how much to take   ferric citrate 1 GM 210 MG(Fe) tablet Commonly known as: AURYXIA Take 210 mg by mouth 3 (three) times daily with meals.   furosemide 40 MG tablet Commonly known as: LASIX Take 40 mg by mouth daily.   hydrALAZINE 25 MG tablet Commonly known as: APRESOLINE Take 25 mg by mouth 2 (two) times daily.   isosorbide mononitrate 30 MG 24 hr tablet Commonly known as: IMDUR Take 30 mg by mouth daily.   lidocaine 5 % Commonly known as: LIDODERM Place 2 patches onto the skin daily. Remove & Discard patch within 12 hours or as directed by MD   naloxone 4 MG/0.1ML Liqd nasal spray kit Commonly known as: NARCAN Place 0.4 mg into the nose See admin instructions.  SPRAY 1 SPRAY INTO ONE NOSTRIL AS DIRECTED AS NEEDED FOR RESCUE FOR OPIOID OVERDOSE - CALL 911 IMMEDIATELY, ADMINISTER DOSE, THEN TURN PERSON ON SIDE - IF NO RESPONSE IN 2-3 MINUTES OR PERSON RESPONDS BUT RELAPSES, REPEAT USING A NEW SPRAY DEVICE AND SPRAY INTO THE OTHER NOSTRIL AS NEEDED FOR RESCUE   omeprazole 40 MG capsule Commonly known as: PRILOSEC Take 1 capsule (40 mg total) by mouth daily.   ondansetron 8 MG disintegrating tablet Commonly known as: ZOFRAN-ODT Take 8 mg by mouth every 6 (six) hours.   oxyCODONE-acetaminophen 5-325 MG tablet Commonly known as: PERCOCET/ROXICET Take 1 tablet by mouth every 4 (four) hours as needed for severe pain (pain score 7-10).   PSYLLIUM PO Take by mouth.   rosuvastatin 10 MG tablet Commonly known as: CRESTOR Take 10 mg by mouth daily.   sevelamer carbonate 800 MG tablet Commonly known as: RENVELA Take 1,600 mg by mouth 3 (three) times daily with meals.   sorbitol 70 % solution Take 30 mLs by mouth 3 (three) times daily as needed  (constipation).   tamsulosin 0.4 MG Caps capsule Commonly known as: FLOMAX Take 0.4 mg by mouth at bedtime.   Tiotropium Bromide-Olodaterol 2.5-2.5 MCG/ACT Aers Take 2 puffs by mouth in the morning.        Follow-up Information     Benjiman Core, MD. Schedule an appointment as soon as possible for a visit in 1 week(s).   Specialty: Internal Medicine Contact information: [address] [place] [address] [phone]                Allergies  Allergen Reactions   Enalapril Other (  See Comments)    Renal failure syndrome   Gabapentin     Encephalopathy, tremor   Iodinated Contrast Media Nausea And Vomiting     Other Procedures/Studies: IR Removal Tun Cv Cath W/O FL Result Date: 11/18/2023 INDICATION: Request removal of tunneled right femoral hemodialysis catheter. This was placed at outside facility. Catheter was partially dislodged. EXAM: REMOVAL OF TUNNELED RIGHT COMMON FEMORAL HEMODIALYSIS CATHETER MEDICATIONS: None COMPLICATIONS: None immediate. PROCEDURE: Informed written consent was obtained from the patient following an explanation of the procedure, risks, benefits and alternatives to treatment. A time out was performed prior to the initiation of the procedure. Maximal barrier sterile technique was utilized including caps, mask, sterile gowns, sterile gloves, large sterile drape, hand hygiene, and chlorhexidine. The catheter was removed intact without difficulty. Hemostasis was obtained with manual compression. A dressing was placed. The patient tolerated the procedure well without immediate post procedural complication. IMPRESSION: Successful removal of tunneled right common femoral hemodialysis catheter. Procedure performed by Brayton El PA-C and supervised by Dr. Richarda Overlie Electronically Signed   By: Richarda Overlie M.D.   On: 11/18/2023 11:39   DG Chest 2 View Result Date: 11/16/2023 CLINICAL DATA:  Shortness of breath. EXAM: CHEST - 2 VIEW  COMPARISON:  November 15, 2023 FINDINGS: The cardiac silhouette is enlarged and unchanged in size. Moderate to marked severity calcification of the aortic arch is seen. There is mild prominence of the central pulmonary vasculature. No focal consolidation, pleural effusion or pneumothorax is seen. Multilevel degenerative changes are noted throughout the thoracic spine. IMPRESSION: Cardiomegaly with mild central pulmonary vascular congestion. Electronically Signed   By: Aram Candela M.D.   On: 11/16/2023 02:25   DG CHEST PORT 1 VIEW Result Date: 11/15/2023 CLINICAL DATA:  Shortness of breath.  End-stage renal disease. EXAM: PORTABLE CHEST 1 VIEW COMPARISON:  Two-view chest x-ray 08/30/2023 FINDINGS: The heart is enlarged. Atherosclerotic changes are present the aortic arch. Mild interstitial edema is present. Left basilar airspace disease likely reflects atelectasis. Degenerative changes are present at both shoulders, stable. IMPRESSION: 1. Cardiomegaly and mild interstitial edema compatible with congestive heart failure. 2. Left basilar airspace disease likely reflects atelectasis. Electronically Signed   By: Marin Roberts M.D.   On: 11/15/2023 18:17   CT Head Wo Contrast Result Date: 11/15/2023 CLINICAL DATA:  Altered mental status. EXAM: CT HEAD WITHOUT CONTRAST TECHNIQUE: Contiguous axial images were obtained from the base of the skull through the vertex without intravenous contrast. RADIATION DOSE REDUCTION: This exam was performed according to the departmental dose-optimization program which includes automated exposure control, adjustment of the mA and/or kV according to patient size and/or use of iterative reconstruction technique. COMPARISON:  06/10/2023. FINDINGS: Brain: No acute intracranial hemorrhage, midline shift or mass effect is seen. No extra-axial fluid collection. Mild diffuse atrophy is noted. Periventricular white matter hypodensities are present bilaterally. No hydrocephalus.  Vascular: No hyperdense vessel or unexpected calcification. Skull: Normal. Negative for fracture or focal lesion. Sinuses/Orbits: Round opacities are noted in the right sphenoid sinus and left ethmoid sinus. No acute orbital abnormality. 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 No acute abnormality is seen. IMPRESSION: 1. No acute intracranial process. 2. Atrophy with chronic microvascular ischemic changes. Electronically Signed   By: Thornell Sartorius M.D.   On: 11/15/2023 14:13   PERIPHERAL VASCULAR CATHETERIZATION Result Date: 10/23/2023 Successful exchange of 55cm right femoral tunneled dialysis catheter.  OK to use immedicately.     TODAY-DAY OF DISCHARGE:  Subjective:  Drago Hammonds today has no headache,no chest abdominal pain,no new weakness tingling or numbness, feels much better wants to go home today.   Objective:   Blood pressure 124/65, pulse (!) 59, temperature 97.7 F (36.5 C), temperature source Oral, resp. rate 20, height 5\' 11"  (1.803 m), weight 63.9 kg, SpO2 (!) 82%.  Intake/Output Summary (Last 24 hours) at 11/19/2023 1153 Last data filed at 11/19/2023 1114 Gross per 24 hour  Intake 716 ml  Output 2500 ml  Net -1784 ml   Filed Weights   11/15/23 1112 11/17/23 0500 11/19/23 0500  Weight: 64 kg 66 kg 63.9 kg    Exam: Awake Alert, Oriented *3, No new F.N deficits, Normal affect H[place]bert.AT,PERRAL Supple Neck,No JVD, No cervical lymphadenopathy appriciated.  Symmetrical Chest wall movement, Good air movement bilaterally, CTAB RRR,No Gallops,Rubs or new Murmurs, No Parasternal Heave +ve B.Sounds, Abd Soft, Non tender, No organomegaly appriciated, No rebound -guarding or rigidity. No Cyanosis, Clubbing or edema, No new Rash or bruise   PERTINENT RADIOLOGIC STUDIES: IR Removal Tun Cv Cath W/O FL Result Date: 11/18/2023 INDICATION: Request removal of tunneled right femoral hemodialysis catheter. This was placed at outside facility. Catheter was  partially dislodged. EXAM: REMOVAL OF TUNNELED RIGHT COMMON FEMORAL HEMODIALYSIS CATHETER MEDICATIONS: None COMPLICATIONS: None immediate. PROCEDURE: Informed written consent was obtained from the patient following an explanation of the procedure, risks, benefits and alternatives to treatment. A time out was performed prior to the initiation of the procedure. Maximal barrier sterile technique was utilized including caps, mask, sterile gowns, sterile gloves, large sterile drape, hand hygiene, and chlorhexidine. The catheter was removed intact without difficulty. Hemostasis was obtained with manual compression. A dressing was placed. The patient tolerated the procedure well without immediate post procedural complication. IMPRESSION: Successful removal of tunneled right common femoral hemodialysis catheter. Procedure performed by Brayton El PA-C and supervised by Dr. Richarda Overlie Electronically Signed   By: Richarda Overlie M.D.   On: 11/18/2023 11:39     PERTINENT LAB RESULTS: CBC: Recent Labs    11/17/23 0428 11/18/23 0420  WBC 4.8 4.5  HGB 12.1* 11.8*  HCT 36.8* 36.5*  PLT 117* 123*   CMET CMP     Component Value Date/Time   NA 137 11/18/2023 0420   NA 140 09/22/2017 0856   K 4.1 11/18/2023 0420   CL 98 11/18/2023 0420   CO2 24 11/18/2023 0420   GLUCOSE 131 (H) 11/18/2023 0420   BUN 51 (H) 11/18/2023 0420   BUN 12 09/22/2017 0856   CREATININE 9.46 (H) 11/18/2023 0420   CREATININE 1.08 09/01/2013 1615   CALCIUM 8.8 (L) 11/18/2023 0420   PROT 6.9 11/16/2023 0424   ALBUMIN 3.1 (L) 11/16/2023 0424   AST 21 11/16/2023 0424   ALT 11 11/16/2023 0424   ALKPHOS 80 11/16/2023 0424   BILITOT 0.6 11/16/2023 0424   GFR 12.22 (LL) 06/06/2022 1113   GFRNONAA 6 (L) 11/18/2023 0420   GFRNONAA 77 09/01/2013 1615    GFR Estimated Creatinine Clearance: 6.9 mL/min (A) (by C-G formula based on SCr of 9.46 mg/dL (H)). No results for input(s): "LIPASE", "AMYLASE" in the last 72 hours. No results for  input(s): "CKTOTAL", "CKMB", "CKMBINDEX", "TROPONINI" in the last 72 hours. Invalid input(s): "POCBNP" No results for input(s): "DDIMER" in the last 72 hours. No results for input(s): "HGBA1C" in the last 72 hours. No results for input(s): "CHOL", "HDL", "LDLCALC", "TRIG", "CHOLHDL", "LDLDIRECT" in the last 72 hours. No results for input(s): "TSH", "T4TOTAL", "T3FREE", "THYROIDAB" in the  last 72 hours.  Invalid input(s): "FREET3" No results for input(s): "VITAMINB12", "FOLATE", "FERRITIN", "TIBC", "IRON", "RETICCTPCT" in the last 72 hours. Coags: No results for input(s): "INR" in the last 72 hours.  Invalid input(s): "PT" Microbiology: No results found for this or any previous visit (from the past 240 hours).  FURTHER DISCHARGE INSTRUCTIONS:  Get Medicines reviewed and adjusted: Please take all your medications with you for your next visit with your Primary MD  Laboratory/radiological data: Please request your Primary MD to go over all hospital tests and procedure/radiological results at the follow up, please ask your Primary MD to get all Hospital records sent to his/her office.  In some cases, they will be blood work, cultures and biopsy results pending at the time of your discharge. Please request that your primary care M.D. goes through all the records of your hospital data and follows up on these results.  Also Note the following: If you experience worsening of your admission symptoms, develop shortness of breath, life threatening emergency, suicidal or homicidal thoughts you must seek medical attention immediately by calling 911 or calling your MD immediately  if symptoms less severe.  You must read complete instructions/literature along with all the possible adverse reactions/side effects for all the Medicines you take and that have been prescribed to you. Take any new Medicines after you have completely understood and accpet all the possible adverse reactions/side effects.   Do  not drive when taking Pain medications or sleeping medications (Benzodaizepines)  Do not take more than prescribed Pain, Sleep and Anxiety Medications. It is not advisable to combine anxiety,sleep and pain medications without talking with your primary care practitioner  Special Instructions: If you have smoked or chewed Tobacco  in the last 2 yrs please stop smoking, stop any regular Alcohol  and or any Recreational drug use.  Wear Seat belts while driving.  Please note: You were cared for by a hospitalist during your hospital stay. Once you are discharged, your primary care physician will handle any further medical issues. Please note that NO REFILLS for any discharge medications will be authorized once you are discharged, as it is imperative that you return to your primary care physician (or establish a relationship with a primary care physician if you do not have one) for your post hospital discharge needs so that they can reassess your need for medications and monitor your lab values.  Total Time spent coordinating discharge including counseling, education and face to face time equals greater than 30 minutes.  SignedJeoffrey Massed 11/19/2023 11:53 AM

## 2023-11-25 LAB — BENZODIAZEPINES,MS,WB/SP RFX
7-Aminoclonazepam: NEGATIVE ng/mL
Alprazolam: NEGATIVE ng/mL
Benzodiazepines Confirm: NEGATIVE
Chlordiazepoxide: NEGATIVE
Clonazepam: NEGATIVE ng/mL
Desalkylflurazepam: NEGATIVE ng/mL
Desmethylchlordiazepoxide: NEGATIVE
Desmethyldiazepam: NEGATIVE ng/mL
Diazepam: NEGATIVE ng/mL
Flurazepam: NEGATIVE ng/mL
Lorazepam: NEGATIVE ng/mL
Midazolam: NEGATIVE ng/mL
Oxazepam: NEGATIVE ng/mL
Temazepam: NEGATIVE ng/mL
Triazolam: NEGATIVE ng/mL

## 2023-11-25 LAB — COCAINE,MS,WB/SP RFX
Benzoylecgonine: >1500 ng/mL
Cocaine Confirmation: POSITIVE
Cocaine: NEGATIVE ng/mL

## 2023-11-29 LAB — THC,MS,WB/SP RFX
Cannabidiol: NEGATIVE ng/mL
Cannabinoid Confirmation: POSITIVE
Carboxy-THC: 1.2 ng/mL
Hydroxy-THC: NEGATIVE ng/mL
Tetrahydrocannabinol(THC): NEGATIVE ng/mL

## 2023-11-29 LAB — DRUG SCREEN 10 W/CONF, SERUM
Amphetamines, IA: NEGATIVE ng/mL
Barbiturates, IA: NEGATIVE ug/mL
Benzodiazepines, IA: NEGATIVE ng/mL
Cocaine & Metabolite, IA: POSITIVE ng/mL — AB
Methadone, IA: NEGATIVE ng/mL
Opiates, IA: NEGATIVE ng/mL
Oxycodones, IA: NEGATIVE ng/mL
Phencyclidine, IA: NEGATIVE ng/mL
Propoxyphene, IA: NEGATIVE ng/mL
THC(Marijuana) Metabolite, IA: POSITIVE ng/mL — AB

## 2023-12-07 ENCOUNTER — Other Ambulatory Visit: Payer: Self-pay

## 2023-12-07 ENCOUNTER — Observation Stay (HOSPITAL_COMMUNITY)
Admission: EM | Admit: 2023-12-07 | Discharge: 2023-12-07 | Discharge status: Against Medical Advice | Attending: Hospitalist | Admitting: Hospitalist

## 2023-12-07 ENCOUNTER — Encounter (HOSPITAL_COMMUNITY): Payer: Self-pay | Admitting: Hospitalist

## 2023-12-07 ENCOUNTER — Emergency Department (HOSPITAL_COMMUNITY)

## 2023-12-07 ENCOUNTER — Observation Stay (HOSPITAL_COMMUNITY)

## 2023-12-07 DIAGNOSIS — Z7901 Long term (current) use of anticoagulants: Secondary | ICD-10-CM | POA: Insufficient documentation

## 2023-12-07 DIAGNOSIS — Z79899 Other long term (current) drug therapy: Secondary | ICD-10-CM | POA: Diagnosis not present

## 2023-12-07 DIAGNOSIS — Z992 Dependence on renal dialysis: Secondary | ICD-10-CM | POA: Diagnosis not present

## 2023-12-07 DIAGNOSIS — E875 Hyperkalemia: Secondary | ICD-10-CM | POA: Insufficient documentation

## 2023-12-07 DIAGNOSIS — E8770 Fluid overload, unspecified: Secondary | ICD-10-CM | POA: Insufficient documentation

## 2023-12-07 DIAGNOSIS — Z4931 Encounter for adequacy testing for hemodialysis: Secondary | ICD-10-CM | POA: Diagnosis not present

## 2023-12-07 DIAGNOSIS — J449 Chronic obstructive pulmonary disease, unspecified: Secondary | ICD-10-CM | POA: Insufficient documentation

## 2023-12-07 DIAGNOSIS — T82898A Other specified complication of vascular prosthetic devices, implants and grafts, initial encounter: Secondary | ICD-10-CM | POA: Insufficient documentation

## 2023-12-07 DIAGNOSIS — Z91158 Patient's noncompliance with renal dialysis for other reason: Secondary | ICD-10-CM | POA: Diagnosis not present

## 2023-12-07 DIAGNOSIS — F17210 Nicotine dependence, cigarettes, uncomplicated: Secondary | ICD-10-CM | POA: Insufficient documentation

## 2023-12-07 DIAGNOSIS — N186 End stage renal disease: Secondary | ICD-10-CM | POA: Diagnosis not present

## 2023-12-07 DIAGNOSIS — R0602 Shortness of breath: Secondary | ICD-10-CM | POA: Diagnosis present

## 2023-12-07 DIAGNOSIS — J811 Chronic pulmonary edema: Principal | ICD-10-CM | POA: Insufficient documentation

## 2023-12-07 DIAGNOSIS — I120 Hypertensive chronic kidney disease with stage 5 chronic kidney disease or end stage renal disease: Secondary | ICD-10-CM | POA: Diagnosis not present

## 2023-12-07 DIAGNOSIS — T8249XA Other complication of vascular dialysis catheter, initial encounter: Secondary | ICD-10-CM | POA: Insufficient documentation

## 2023-12-07 DIAGNOSIS — I2510 Atherosclerotic heart disease of native coronary artery without angina pectoris: Secondary | ICD-10-CM | POA: Insufficient documentation

## 2023-12-07 DIAGNOSIS — Z8616 Personal history of COVID-19: Secondary | ICD-10-CM | POA: Diagnosis not present

## 2023-12-07 HISTORY — PX: IR US GUIDE VASC ACCESS RIGHT: IMG2390

## 2023-12-07 HISTORY — PX: IR FLUORO GUIDE CV LINE RIGHT: IMG2283

## 2023-12-07 LAB — CBC
HCT: 41.9 % (ref 39.0–52.0)
HCT: 40.3 % (ref 39.0–52.0)
Hemoglobin: 13.5 g/dL (ref 13.0–17.0)
Hemoglobin: 13.1 g/dL (ref 13.0–17.0)
MCH: 27.4 pg (ref 26.0–34.0)
MCH: 27.2 pg (ref 26.0–34.0)
MCHC: 32.2 g/dL (ref 30.0–36.0)
MCHC: 32.5 g/dL (ref 30.0–36.0)
MCV: 85 fL (ref 80.0–100.0)
MCV: 83.6 fL (ref 80.0–100.0)
Platelets: 124 10*3/uL — ABNORMAL LOW (ref 150–400)
Platelets: 143 10*3/uL — ABNORMAL LOW (ref 150–400)
RBC: 4.93 MIL/uL (ref 4.22–5.81)
RBC: 4.82 MIL/uL (ref 4.22–5.81)
RDW: 17.2 % — ABNORMAL HIGH (ref 11.5–15.5)
RDW: 16.7 % — ABNORMAL HIGH (ref 11.5–15.5)
WBC: 6.3 10*3/uL (ref 4.0–10.5)
WBC: 6.1 10*3/uL (ref 4.0–10.5)
nRBC: 0 % (ref 0.0–0.2)
nRBC: 0 % (ref 0.0–0.2)

## 2023-12-07 LAB — I-STAT VENOUS BLOOD GAS, ED
Acid-Base Excess: 7 mmol/L — ABNORMAL HIGH (ref 0.0–2.0)
Bicarbonate: 32.7 mmol/L — ABNORMAL HIGH (ref 20.0–28.0)
Calcium, Ion: 1 mmol/L — ABNORMAL LOW (ref 1.15–1.40)
HCT: 44 % (ref 39.0–52.0)
Hemoglobin: 15 g/dL (ref 13.0–17.0)
O2 Saturation: 79 %
Potassium: 5.7 mmol/L — ABNORMAL HIGH (ref 3.5–5.1)
Sodium: 138 mmol/L (ref 135–145)
TCO2: 34 mmol/L — ABNORMAL HIGH (ref 22–32)
pCO2, Ven: 47 mmHg (ref 44–60)
pH, Ven: 7.451 — ABNORMAL HIGH (ref 7.25–7.43)
pO2, Ven: 42 mmHg (ref 32–45)

## 2023-12-07 LAB — LIPID PANEL
Cholesterol: 127 mg/dL (ref 0–200)
HDL: 61 mg/dL (ref >40)
LDL Cholesterol: 55 mg/dL (ref 0–99)
Total CHOL/HDL Ratio: 2.1 ratio
Triglycerides: 55 mg/dL (ref <150)
VLDL: 11 mg/dL (ref 0–40)

## 2023-12-07 LAB — BASIC METABOLIC PANEL WITH GFR
Anion gap: 20 — ABNORMAL HIGH (ref 5–15)
BUN: 48 mg/dL — ABNORMAL HIGH (ref 8–23)
CO2: 25 mmol/L (ref 22–32)
Calcium: 9.3 mg/dL (ref 8.9–10.3)
Chloride: 95 mmol/L — ABNORMAL LOW (ref 98–111)
Creatinine, Ser: 9.93 mg/dL — ABNORMAL HIGH (ref 0.61–1.24)
GFR, Estimated: 5 mL/min — ABNORMAL LOW (ref >60)
Glucose, Bld: 106 mg/dL — ABNORMAL HIGH (ref 70–99)
Potassium: 5.5 mmol/L — ABNORMAL HIGH (ref 3.5–5.1)
Sodium: 140 mmol/L (ref 135–145)

## 2023-12-07 LAB — I-STAT CHEM 8, ED
BUN: 63 mg/dL — ABNORMAL HIGH (ref 8–23)
Calcium, Ion: 0.99 mmol/L — ABNORMAL LOW (ref 1.15–1.40)
Chloride: 99 mmol/L (ref 98–111)
Creatinine, Ser: 10.5 mg/dL — ABNORMAL HIGH (ref 0.61–1.24)
Glucose, Bld: 104 mg/dL — ABNORMAL HIGH (ref 70–99)
HCT: 45 % (ref 39.0–52.0)
Hemoglobin: 15.3 g/dL (ref 13.0–17.0)
Potassium: 5.7 mmol/L — ABNORMAL HIGH (ref 3.5–5.1)
Sodium: 138 mmol/L (ref 135–145)
TCO2: 32 mmol/L (ref 22–32)

## 2023-12-07 LAB — CREATININE, SERUM
Creatinine, Ser: 10.39 mg/dL — ABNORMAL HIGH (ref 0.61–1.24)
GFR, Estimated: 5 mL/min — ABNORMAL LOW (ref >60)

## 2023-12-07 LAB — HIV ANTIBODY (ROUTINE TESTING W REFLEX): HIV Screen 4th Generation wRfx: NONREACTIVE

## 2023-12-07 MED ORDER — OXYCODONE-ACETAMINOPHEN 5-325 MG PO TABS: 1.0000 | ORAL_TABLET | ORAL | Status: DC | PRN

## 2023-12-07 MED ORDER — APIXABAN 5 MG PO TABS: 5.0000 mg | ORAL_TABLET | Freq: Two times a day (BID) | ORAL | Status: DC

## 2023-12-07 MED ORDER — AMLODIPINE BESYLATE 5 MG PO TABS: 10.0000 mg | ORAL_TABLET | Freq: Every day | ORAL | Status: DC

## 2023-12-07 MED ORDER — LIDOCAINE HCL (PF) 1 % IJ SOLN: 5.0000 mL | INTRAMUSCULAR | Status: DC | PRN

## 2023-12-07 MED ORDER — FUROSEMIDE 10 MG/ML IJ SOLN: 40.0000 mg | Freq: Once | INTRAMUSCULAR | Status: DC

## 2023-12-07 MED ORDER — TAMSULOSIN HCL 0.4 MG PO CAPS
0.4000 mg | ORAL_CAPSULE | Freq: Every day | ORAL | Status: DC
  Filled 2023-12-07: qty 1

## 2023-12-07 MED ORDER — ALTEPLASE 2 MG IJ SOLR: 2.0000 mg | Freq: Once | INTRAMUSCULAR | Status: DC | PRN

## 2023-12-07 MED ORDER — LIDOCAINE-EPINEPHRINE 1 %-1:100000 IJ SOLN
INTRAMUSCULAR | Status: AC
  Filled 2023-12-07: qty 1

## 2023-12-07 MED ORDER — ROSUVASTATIN CALCIUM 5 MG PO TABS: 10.0000 mg | ORAL_TABLET | Freq: Every day | ORAL | Status: DC

## 2023-12-07 MED ORDER — SEVELAMER CARBONATE 800 MG PO TABS
1600.0000 mg | ORAL_TABLET | Freq: Three times a day (TID) | ORAL | Status: DC
Start: 2023-12-07 — End: 2023-12-08

## 2023-12-07 MED ORDER — SODIUM ZIRCONIUM CYCLOSILICATE 5 G PO PACK
5.0000 g | PACK | ORAL | Status: AC
  Administered 2023-12-07: 5 g via ORAL
  Filled 2023-12-07: qty 1

## 2023-12-07 MED ORDER — RENA-VITE PO TABS: 1.0000 | ORAL_TABLET | Freq: Every day | ORAL | Status: DC

## 2023-12-07 MED ORDER — ARFORMOTEROL TARTRATE 15 MCG/2ML IN NEBU: 15.0000 ug | INHALATION_SOLUTION | Freq: Two times a day (BID) | RESPIRATORY_TRACT | Status: DC

## 2023-12-07 MED ORDER — ARIPIPRAZOLE 10 MG PO TABS: 5.0000 mg | ORAL_TABLET | Freq: Every day | ORAL | Status: DC

## 2023-12-07 MED ORDER — ISOSORBIDE MONONITRATE ER 30 MG PO TB24: 30.0000 mg | ORAL_TABLET | Freq: Every day | ORAL | Status: DC

## 2023-12-07 MED ORDER — NEPRO/CARBSTEADY PO LIQD: 237.0000 mL | ORAL | Status: DC | PRN

## 2023-12-07 MED ORDER — HEPARIN SODIUM (PORCINE) 1000 UNIT/ML DIALYSIS: 1000.0000 [IU] | INTRAMUSCULAR | Status: DC | PRN

## 2023-12-07 MED ORDER — HEPARIN SODIUM (PORCINE) 5000 UNIT/ML IJ SOLN
5000.0000 [IU] | Freq: Three times a day (TID) | INTRAMUSCULAR | Status: DC
Start: 2023-12-07 — End: 2023-12-07

## 2023-12-07 MED ORDER — PANTOPRAZOLE SODIUM 40 MG PO TBEC: 40.0000 mg | DELAYED_RELEASE_TABLET | Freq: Every day | ORAL | Status: DC

## 2023-12-07 MED ORDER — FUROSEMIDE 10 MG/ML IJ SOLN
40.0000 mg | Freq: Once | INTRAMUSCULAR | Status: AC
  Administered 2023-12-07: 40 mg via INTRAVENOUS
  Filled 2023-12-07: qty 4

## 2023-12-07 MED ORDER — UMECLIDINIUM BROMIDE 62.5 MCG/ACT IN AEPB
1.0000 | INHALATION_SPRAY | Freq: Every day | RESPIRATORY_TRACT | Status: DC
  Filled 2023-12-07: qty 7

## 2023-12-07 MED ORDER — ALBUTEROL SULFATE HFA 108 (90 BASE) MCG/ACT IN AERS
2.0000 | INHALATION_SPRAY | Freq: Once | RESPIRATORY_TRACT | Status: AC
  Administered 2023-12-07: 2 via RESPIRATORY_TRACT
  Filled 2023-12-07: qty 6.7

## 2023-12-07 MED ORDER — FERRIC CITRATE 1 GM 210 MG(FE) PO TABS
210.0000 mg | ORAL_TABLET | Freq: Three times a day (TID) | ORAL | Status: DC
Start: 2023-12-07 — End: 2023-12-07

## 2023-12-07 MED ORDER — ALBUTEROL SULFATE (2.5 MG/3ML) 0.083% IN NEBU
3.0000 mL | INHALATION_SOLUTION | Freq: Four times a day (QID) | RESPIRATORY_TRACT | Status: DC | PRN
Start: 2023-12-07 — End: 2023-12-08

## 2023-12-07 MED ORDER — LIDOCAINE-PRILOCAINE 2.5-2.5 % EX CREA: 1.0000 | TOPICAL_CREAM | CUTANEOUS | Status: DC | PRN

## 2023-12-07 MED ORDER — CARVEDILOL 12.5 MG PO TABS: 12.5000 mg | ORAL_TABLET | Freq: Two times a day (BID) | ORAL | Status: DC

## 2023-12-07 MED ORDER — PENTAFLUOROPROP-TETRAFLUOROETH EX AERO: 1.0000 | INHALATION_SPRAY | CUTANEOUS | Status: DC | PRN

## 2023-12-07 MED ORDER — DIALYVITE PO TABS
1.0000 | ORAL_TABLET | Freq: Every day | ORAL | Status: DC
Start: 2023-12-07 — End: 2023-12-07

## 2023-12-07 MED ORDER — ANTICOAGULANT SODIUM CITRATE 4% (200MG/5ML) IV SOLN: 5.0000 mL | Status: DC | PRN

## 2023-12-07 MED ORDER — FUROSEMIDE 10 MG/ML IJ SOLN: 80.0000 mg | Freq: Once | INTRAMUSCULAR | Status: DC

## 2023-12-07 MED ORDER — HEPARIN SODIUM (PORCINE) 1000 UNIT/ML IJ SOLN
INTRAMUSCULAR | Status: AC
  Filled 2023-12-07: qty 10

## 2023-12-07 MED ORDER — ESCITALOPRAM OXALATE 10 MG PO TABS: 10.0000 mg | ORAL_TABLET | Freq: Every day | ORAL | Status: DC

## 2023-12-07 MED ORDER — CHLORHEXIDINE GLUCONATE CLOTH 2 % EX PADS: 6.0000 | MEDICATED_PAD | Freq: Every day | CUTANEOUS | Status: DC

## 2023-12-07 NOTE — Progress Notes (Addendum)
   12/07/23 1954  Vitals  Temp 98.2 F (36.8 C)  Temp Source Oral  BP (!) 151/93  MAP (mmHg) 110  BP Location Left Arm  BP Method Automatic  Patient Position (if appropriate) Sitting  Pulse Rate 95  Pulse Rate Source Dinamap  Resp (!) 21  Oxygen Therapy  SpO2 100 %  O2 Device Nasal Cannula   Pt refuse hemodialysis tonight despite being advise of the risk takin Dr. America Bake, nephrologist on call informed.

## 2023-12-07 NOTE — Consult Note (Signed)
 Chief Complaint: Malfunctioning RUE AV fistula; request for temp HD cath and AV fistula declot  Referring Provider(s): Dr. Zana Hesselbach  Supervising Physician: Marland Silvas  Patient Status: [facility] - In-pt  History of Present Illness: Ronald Reeves is a 68 y.o. male  with medical history significant of HTN, GERD and ESRD (HD, last tx Thurs, T/Th/Sat). He missed HD Sat d/t clotted fistula. Presented to the ER with complaint of SHOB and leg swelling. He reported cocaine  and marijuana use yesterday to ED PA. He continues to be oriented x4 but falls asleep at times during conversation. Easily wakes with verbal stimuli; this behavior also noted in ED note. Wearing 4L [place], lying in bed at time of interview.  IP team plans for patient to receive temp cath for HD tonight and then return to IR in AM for fistula declot.    Patient is Full Code  Past Medical History:  Diagnosis Date   Anemia    Anxiety    Arthritis    Back pain    Bronchitis    COPD (chronic obstructive pulmonary disease) (HCC)    Coronary artery disease    COVID    mild - flu like symptoms   Dyspnea    w/ exertion, uses inhaler   ESRD on hemodialysis (HCC)    dialysis on tues, thurs, sat at [facility]   GERD (gastroesophageal reflux disease)    not a current problem   Heart murmur    never has caused any problems   Hypertension    Myocardial infarction (HCC)    Pneumonia    x 1   Pre-diabetes    diet controlled, no meds, does not check blood sugar   Substance abuse [facility])     Past Surgical History:  Procedure Laterality Date   A/V FISTULAGRAM Right 06/17/2023   Procedure: A/V Fistulagram;  Surgeon: Kayla Part, MD;  Location: [facility] INVASIVE CV LAB;  Service: Cardiovascular;  Laterality: Right;   A/V FISTULAGRAM Right 07/17/2023   Procedure: A/V Fistulagram;  Surgeon: Carlene Che, MD;  Location: [facility] INVASIVE CV LAB;  Service: Cardiovascular;  Laterality: Right;   AV FISTULA PLACEMENT Left 04/12/2021   Procedure: LEFT  ARM ARTERIOVENOUS (AV) FISTULA CREATION;  Surgeon: Young Hensen, MD;  Location: [facility] OR;  Service: Vascular;  Laterality: Left;   AV FISTULA PLACEMENT Left 10/18/2021   Procedure: LEFT ARM BRACHIOBASILIC FISTULA CREATION FIRST STAGE;  Surgeon: Young Hensen, MD;  Location: [facility] OR;  Service: Vascular;  Laterality: Left;   AV FISTULA PLACEMENT Right 11/28/2022   Procedure: RIGHT ARM FIRST STAGE BRACHIOBASILIC FISTULA CREATION;  Surgeon: Young Hensen, MD;  Location: [facility] OR;  Service: Vascular;  Laterality: Right;   AV FISTULA PLACEMENT Right 10/19/2023   Procedure: RIGHT ARM ARTERIOVENOUS GRAFT CREATION;  Surgeon: Carlene Che, MD;  Location: [facility] OR;  Service: Vascular;  Laterality: Right;   BACK SURGERY     BASCILIC VEIN TRANSPOSITION Left 01/27/2022   Procedure: LEFT SECOND STAGE BASILIC VEIN TRANSPOSITION;  Surgeon: Young Hensen, MD;  Location[facility] OR;  Service: Vascular;  Laterality: Left;   BASCILIC VEIN TRANSPOSITION Right 02/18/2023   Procedure: SECOND STAGE RIGHT ARM BASILIC VEIN TRANSPOSITION;  Surgeon: Young Hensen, MD;  Location: [facility] OR;  Service: Vascular;  Laterality: Right;   BIOPSY  08/20/2022   Procedure: BIOPSY;  Surgeon: Annis Kinder, DO;  Location: [facility] ENDOSCOPY;  Service: Gastroenterology;;   COLONOSCOPY WITH PROPOFOL  N/A 08/20/2022   Procedure: COLONOSCOPY WITH  PROPOFOL ;  Surgeon: Annis Kinder, DO;  Location: [facility]OS[facility];  Service: Gastroenterology;  Laterality: N/A;   DIALYSIS/PERMA CATHETER INSERTION Right 03/05/2023   Procedure: DIALYSIS/PERMA CATHETER INSERTION;  Surgeon: Young Hensen, MD;  Location: [facility] INVASIVE CV LAB;  Service: Cardiovascular;  Laterality: Right;   ESOPHAGOGASTRODUODENOSCOPY (EGD) WITH PROPOFOL  N/A 08/20/2022   Procedure: ESOPHAGOGASTRODUODENOSCOPY (EGD) WITH PROPOFOL ;  Surgeon: Annis Kinder, DO;  Location: [facility]OS[facility];  Service: Gastroenterology;  Laterality: N/A;   INSERTION OF DIALYSIS CATHETER Right  05/01/2022   Procedure: INSERTION OF DIALYSIS CATHETER;  Surgeon: Carlene Che, MD;  Location: [facility] OR;  Service: Vascular;  Laterality: Right;   INSERTION OF DIALYSIS CATHETER Right 07/10/2023   Procedure: ULTRASOUNDED GUIDED INSERTION OF TUNNELED  DIALYSIS CATHETER TO RIGHT FEMORAL VEIN;  Surgeon: Young Hensen, MD;  Location: [facility] OR;  Service: Vascular;  Laterality: Right;   IR REMOVAL TUN CV CATH W/O FL  12/19/2022   IR REMOVAL TUN CV CATH W/O FL  11/18/2023   LIGATION OF COMPETING BRANCHES OF ARTERIOVENOUS FISTULA Left 07/15/2021   Procedure: LIGATION OF COMPETING BRANCHES OF LEFT RADIOCEPHALIC ARTERIOVENOUS FISTULA TIMES TWO;  Surgeon: Young Hensen, MD;  Location[facility];  Service: Vascular;  Laterality: Left;   PERIPHERAL VASCULAR BALLOON ANGIOPLASTY  07/17/2023   Procedure: PERIPHERAL VASCULAR BALLOON ANGIOPLASTY;  Surgeon: Carlene Che, MD;  [facility] INVASIVE CV LAB;  Service: Cardiovascular;;   PERIPHERAL VASCULAR INTERVENTION  06/17/2023   Procedure: PERIPHERAL VASCULAR INTERVENTION;  Surgeon: Kayla Part, MD;  Location: [facility] INVASIVE CV LAB;  Service: Cardiovascular;;   POLYPECTOMY  08/20/2022   Procedure: POLYPECTOMY;  Surgeon: Annis Kinder, DO;  Location: [facility];  Service: Gastroenterology;;   REVISON OF ARTERIOVENOUS FISTULA Left 07/15/2021   Procedure: REVISON OF LEFT ARTERIOVENOUS FISTULA;  Surgeon: Young Hensen, MD;  Location: [facility] OR;  Service: Vascular;  Laterality: Left;   REVISON OF ARTERIOVENOUS FISTULA Left 05/01/2022   Procedure: ARTERIOVENOUS FISTULA WASHOUT OF ARM HEMATOMA;  Surgeon: Carlene Che, MD;  Location: [facility] OR;  Service: Vascular;  Laterality: Left;   TUNNELLED CATHETER EXCHANGE N/A 10/23/2023   Procedure: TUNNELLED CATHETER EXCHANGE;  Surgeon: Baron Border, MD;  Location: [facility] INVASIVE CV LAB;  Service: Cardiovascular;  Laterality: N/A;   UPPER EXTREMITY VENOGRAPHY Bilateral 08/28/2023   Procedure: UPPER EXTREMITY  VENOGRAPHY;  Surgeon: Carlene Che, MD;  Location: [facility] INVASIVE CV LAB;  Service: Cardiovascular;  Laterality: Bilateral;    Allergies: Enalapril , Gabapentin , and Iodinated contrast media  Medications: Prior to Admission medications   Medication Sig Start Date End Date Taking? Authorizing Provider  albuterol  (VENTOLIN  HFA) 108 (90 Base) MCG/ACT inhaler Inhale 2 puffs into the lungs every 6 (six) hours as needed (COPD).    [provider]  amLODipine  (NORVASC ) 10 MG tablet Take 1 tablet (10 mg total) by mouth daily. Patient taking differently: Take 10 mg by mouth daily. Hold for systolic BP <100 03/21/90   Lenise Quince, MD  apixaban  (ELIQUIS ) 5 MG TABS tablet Take 1 tablet (5 mg total) by mouth 2 (two) times daily. 03/06/23   Atway, Rayann N, DO  ARIPiprazole  (ABILIFY ) 5 MG tablet Take 1 tablet (5 mg total) by mouth daily. 11/20/23   Ghimire, Estil Heman, MD  B Complex-C-Folic Acid  (DIALYVITE TABLET) TABS Take 1 tablet by mouth daily. 12/23/22   [provider]  carvedilol  (COREG ) 12.5 MG tablet Take 12.5 mg by mouth 2 (two) times daily with a meal.  [provider]  escitalopram  (LEXAPRO ) 20 MG tablet Take 0.5 tablets (10 mg total) by mouth at bedtime. 11/19/23   Ghimire, Estil Heman, MD  ferric citrate (AURYXIA) 1 GM 210 MG(Fe) tablet Take 210 mg by mouth 3 (three) times daily with meals.    [provider]  furosemide  (LASIX ) 40 MG tablet Take 40 mg by mouth daily.    [provider]  hydrALAZINE  (APRESOLINE ) 25 MG tablet Take 25 mg by mouth 2 (two) times daily.    [provider]  isosorbide  mononitrate (IMDUR ) 30 MG 24 hr tablet Take 30 mg by mouth daily.    [provider]  lidocaine  (LIDODERM ) 5 % Place 2 patches onto the skin daily. Remove & Discard patch within 12 hours or as directed by MD    [provider]  naloxone  (NARCAN ) nasal spray 4 mg/0.1 mL Place 0.4 mg into the nose See admin instructions.  SPRAY 1  SPRAY INTO ONE NOSTRIL AS DIRECTED AS NEEDED FOR RESCUE FOR OPIOID OVERDOSE - CALL 911 IMMEDIATELY, ADMINISTER DOSE, THEN TURN PERSON ON SIDE - IF NO RESPONSE IN 2-3 MINUTES OR PERSON RESPONDS BUT RELAPSES, REPEAT USING A NEW SPRAY DEVICE AND SPRAY INTO THE OTHER NOSTRIL AS NEEDED FOR RESCUE 01/21/23   [provider]  omeprazole  (PRILOSEC) 40 MG capsule Take 1 capsule (40 mg total) by mouth daily. Patient taking differently: Take 40 mg by mouth daily as needed. 06/06/22   Kennedy-Smith, Colleen M, NP  ondansetron  (ZOFRAN -ODT) 8 MG disintegrating tablet Take 8 mg by mouth every 6 (six) hours. 11/06/23   [provider]  oxyCODONE -acetaminophen  (PERCOCET/ROXICET) 5-325 MG tablet Take 1 tablet by mouth every 4 (four) hours as needed for severe pain (pain score 7-10). 10/20/23   Carlene Che, MD  PSYLLIUM PO Take by mouth. 09/03/23   [provider]  rosuvastatin  (CRESTOR ) 10 MG tablet Take 10 mg by mouth daily.    [provider]  sevelamer  carbonate (RENVELA ) 800 MG tablet Take 1,600 mg by mouth 3 (three) times daily with meals. 01/06/23   [provider]  sorbitol 70 % solution Take 30 mLs by mouth 3 (three) times daily as needed (constipation). 01/20/23   [provider]  tamsulosin  (FLOMAX ) 0.4 MG CAPS capsule Take 0.4 mg by mouth at bedtime.    [provider]  Tiotropium Bromide-Olodaterol 2.5-2.5 MCG/ACT AERS Take 2 puffs by mouth in the morning. 08/07/23   [provider]     Family History  Problem Relation Age of Onset   Heart disease Mother    Heart attack Sister 33    Social History   Socioeconomic History   Marital status: Married    Spouse name: Not on file   Number of children: Not on file   Years of education: Not on file   Highest education level: Not on file  Occupational History   Not on file  Tobacco Use   Smoking status: Every Day    Current packs/day: 0.50    Types: Cigars, Cigarettes    Last  attempt to quit: 2019    Passive exposure: Past   Smokeless tobacco: Never   Tobacco comments:    1-2 per day -  black and mild cigars  Vaping Use   Vaping status: Never Used  Substance and Sexual Activity   Alcohol use: Not Currently    Alcohol/week: 14.0 standard drinks of alcohol    Types: 14 Cans of beer per week  Comment: Reports quit 8 months ago   Drug use: Yes    Frequency: 1.0 times per week    Types: Marijuana, Cocaine     Comment: Cocaine  and cannabis daily for 30 years   Sexual activity: Yes  Other Topics Concern   Not on file  Social History Narrative   Not on file   Social Drivers of Health   Financial Resource Strain: Low Risk  (10/06/2023)   [facility]   Overall Financial Resource Strain (CARDIA)    Difficulty of Paying Living Expenses: Not hard at all  Food Insecurity: No Food Insecurity (11/16/2023)   Hunger Vital Sign    Worried About Running Out of Food in the Last Year: Never true    Ran Out of Food in the Last Year: Never true  Transportation Needs: Unmet Transportation Needs (11/16/2023)   PRAPARE - Administrator, Civil Service (Medical): Yes    Lack of Transportation (Non-Medical): No  Physical Activity: Not on file  Stress: No Stress Concern Present (05/22/2023)   Received from [facility] Occupational Health - Occupational Stress Questionnaire    Feeling of Stress : Not at all  Social Connections: Moderately Integrated (11/16/2023)   Social Connection and Isolation Panel [NHANES]    Frequency of Communication with Friends and Family: More than three times a week    Frequency of Social Gatherings with Friends and Family: Three times a week    Attends Religious Services: More than 4 times per year    Active Member of Clubs or Organizations: No    Attends Banker Meetings: Never    Marital Status: Married     Review of Systems: A 12 point ROS discussed and pertinent positives are  indicated in the HPI above.  All other systems are negative.  Review of Systems  Constitutional:  Positive for fatigue. Negative for chills and fever.  Respiratory:  Positive for shortness of breath.   Cardiovascular:  Positive for leg swelling. Negative for chest pain.  Gastrointestinal:  Positive for abdominal pain. Negative for nausea and vomiting.  Skin:  Negative for pallor.  Neurological:  Positive for weakness.  Hematological:  Does not bruise/bleed easily.  Psychiatric/Behavioral:  Negative for confusion.     Vital Signs: BP (!) 168/85 (BP Location: Right Leg)   Pulse 93   Temp 97.7 F (36.5 C) (Oral)   Resp (!) 24   SpO2 99%   Advance Care Plan: No document on file    Physical Exam Cardiovascular:     Rate and Rhythm: Normal rate and regular rhythm.  Pulmonary:     Effort: Tachypnea and accessory muscle usage present.     Breath sounds: Rales present.  Chest:     Chest wall: No tenderness.  Abdominal:     Palpations: Abdomen is soft.     Tenderness: There is abdominal tenderness.     Comments: Generalized, L>R  Musculoskeletal:     Right lower leg: No edema.     Left lower leg: No edema.     Comments: RUE AV fistula : bruit and thrill absent  Skin:    General: Skin is warm and dry.     Comments: No rash or wounds noted to area of planned punctures  Neurological:     Mental Status: He is alert and oriented to person, place, and time.  Psychiatric:        Behavior: Behavior is agitated.  Comments: He is eager to complete treatments and leave     Imaging: DG Chest Portable 1 View Result Date: 12/07/2023 CLINICAL DATA:  Shortness of breath.  Dialysis dependent. EXAM: PORTABLE CHEST 1 VIEW COMPARISON:  11/16/2023 FINDINGS: The cardio pericardial silhouette is enlarged. Vascular congestion noted with basilar predominant airspace disease suggesting edema. No substantial pleural effusion. No acute bony abnormality. Telemetry leads overlie the chest.  IMPRESSION: Enlargement of the cardiopericardial silhouette with basilar predominant airspace disease suggesting edema. Electronically Signed   By: Donnal Fusi M.D.   On: 12/07/2023 07:49   IR Removal Tun Cv Cath W/O FL Result Date: 11/18/2023 INDICATION: Request removal of tunneled right femoral hemodialysis catheter. This was placed at outside facility. Catheter was partially dislodged. EXAM: REMOVAL OF TUNNELED RIGHT COMMON FEMORAL HEMODIALYSIS CATHETER MEDICATIONS: None COMPLICATIONS: None immediate. PROCEDURE: Informed written consent was obtained from the patient following an explanation of the procedure, risks, benefits and alternatives to treatment. A time out was performed prior to the initiation of the procedure. Maximal barrier sterile technique was utilized including caps, mask, sterile gowns, sterile gloves, large sterile drape, hand hygiene, and chlorhexidine . The catheter was removed intact without difficulty. Hemostasis was obtained with manual compression. A dressing was placed. The patient tolerated the procedure well without immediate post procedural complication. IMPRESSION: Successful removal of tunneled right common femoral hemodialysis catheter. Procedure performed by Prudence Brown PA-C and supervised by Dr. Elene Griffes Electronically Signed   By: Elene Griffes M.D.   On: 11/18/2023 11:39   DG Chest 2 View Result Date: 11/16/2023 CLINICAL DATA:  Shortness of breath. EXAM: CHEST - 2 VIEW COMPARISON:  November 15, 2023 FINDINGS: The cardiac silhouette is enlarged and unchanged in size. Moderate to marked severity calcification of the aortic arch is seen. There is mild prominence of the central pulmonary vasculature. No focal consolidation, pleural effusion or pneumothorax is seen. Multilevel degenerative changes are noted throughout the thoracic spine. IMPRESSION: Cardiomegaly with mild central pulmonary vascular congestion. Electronically Signed   By: Virgle Grime M.D.   On: 11/16/2023 02:25    DG CHEST PORT 1 VIEW Result Date: 11/15/2023 CLINICAL DATA:  Shortness of breath.  End-stage renal disease. EXAM: PORTABLE CHEST 1 VIEW COMPARISON:  Two-view chest x-ray 08/30/2023 FINDINGS: The heart is enlarged. Atherosclerotic changes are present the aortic arch. Mild interstitial edema is present. Left basilar airspace disease likely reflects atelectasis. Degenerative changes are present at both shoulders, stable. IMPRESSION: 1. Cardiomegaly and mild interstitial edema compatible with congestive heart failure. 2. Left basilar airspace disease likely reflects atelectasis. Electronically Signed   By: Audree Leas M.D.   On: 11/15/2023 18:17   CT Head Wo Contrast Result Date: 11/15/2023 CLINICAL DATA:  Altered mental status. EXAM: CT HEAD WITHOUT CONTRAST TECHNIQUE: Contiguous axial images were obtained from the base of the skull through the vertex without intravenous contrast. RADIATION DOSE REDUCTION: This exam was performed according to the departmental dose-optimization program which includes automated exposure control, adjustment of the mA and/or kV according to patient size and/or use of iterative reconstruction technique. COMPARISON:  06/10/2023. FINDINGS: Brain: No acute intracranial hemorrhage, midline shift or mass effect is seen. No extra-axial fluid collection. Mild diffuse atrophy is noted. Periventricular white matter hypodensities are present bilaterally. No hydrocephalus. Vascular: No hyperdense vessel or unexpected calcification. Skull: Normal. Negative for fracture or focal lesion. Sinuses/Orbits: Round opacities are noted in the right sphenoid sinus and left ethmoid sinus. No acute orbital abnormality. Other: Subgaleal lipoma is noted over the frontal bone  on the right. No acute abnormality is seen. IMPRESSION: 1. No acute intracranial process. 2. Atrophy with chronic microvascular ischemic changes. Electronically Signed   By: Wyvonnia Heimlich M.D.   On: 11/15/2023 14:13     Labs:  CBC: Recent Labs    11/16/23 0424 11/17/23 0428 11/18/23 0420 12/07/23 0647 12/07/23 0652  WBC 6.6 4.8 4.5 6.3  --   HGB 12.7* 12.1* 11.8* 13.5 15.0  15.3  HCT 39.5 36.8* 36.5* 41.9 44.0  45.0  PLT 107* 117* 123* 124*  --     COAGS: Recent Labs    12/16/22 2337 03/13/23 2053 03/18/23 2255  INR 1.1 1.2 1.3*  APTT  --  32 33    BMP: Recent Labs    11/16/23 0424 11/17/23 0428 11/18/23 0420 12/07/23 0647 12/07/23 0652  NA 134* 134* 137 140 138  138  K 3.9 3.8 4.1 5.5* 5.7*  5.7*  CL 97* 97* 98 95* 99  CO2 24 22 24 25   --   GLUCOSE 108* 122* 131* 106* 104*  BUN 34* 45* 51* 48* 63*  CALCIUM  8.9 9.0 8.8* 9.3  --   CREATININE 7.16* 8.45* 9.46* 9.93* 10.50*  GFRNONAA 8* 6* 6* 5*  --     LIVER FUNCTION TESTS: Recent Labs    07/08/23 0317 09/04/23 1230 11/15/23 0318 11/15/23 1130 11/16/23 0424  BILITOT 0.7  --  0.9 0.8 0.6  AST 21  --  21 21 21   ALT 17  --  10 10 11   ALKPHOS 95  --  122 114 80  PROT 7.4  --  7.4 7.2 6.9  ALBUMIN  3.5 3.6 3.3* 3.4* 3.1*     Assessment and Plan:  Request for  image guided right arteriovenous fistula declot approved for 12/08/23. IR plans to complete temp HD cath placement today. Labs reviewed and within acceptable range for procedure. VS reviewed, anticipated improvement in respiratory status and VS after HD tonight. He will be made NPO at [facility].  Eliquis  washout not indicated.   Risks and benefits discussed with the patient including, but not limited to bleeding, infection, vascular injury, pulmonary embolism, need for tunneled HD catheter placement or even death.  All of the patient's questions were answered, patient is agreeable to proceed. Consent signed and in chart.   Thank you for allowing our service to participate in Ronald Reeves 's care.    Electronically Signed: Terressa Fess, NP   12/07/2023, 3:39 PM     I spent a total of 20 Minutes    in face to face in clinical consultation, greater  than 50% of which was counseling/coordinating care for image guided arteriovenous fistula declot.     (A copy of this note was sent to the referring provider and the time of visit.)

## 2023-12-07 NOTE — Progress Notes (Signed)
 MEDICATION-RELATED CONSULT NOTE   IR Procedure Consult - Anticoagulant/Antiplatelet PTA/Inpatient Med List Review by Pharmacist    Procedure: R internal jugular HD CVC placement     Completed: 12/07/23 at 16:39  Post-Procedural bleeding risk per IR MD assessment:  low  Antithrombotic medications on inpatient or PTA profile prior to procedure:   Eliquis  5mg  BID for hx afib   Recommended restart time per IR Post-Procedure Guidelines:   Day + 1 (Next AM or PM depending on usual administration time pre-procedure   Other considerations:      Plan:    resume Eliquis  5mg  PO BID tomorrow (4/29) at 10:00  Armanda Bern, PharmD, BCPS 12/07/2023 4:53 PM

## 2023-12-07 NOTE — ED Triage Notes (Signed)
 Pt reports missing dialysis yesterday d/t clot in his fistula. No c/o SOA and not feeling well overall.

## 2023-12-07 NOTE — Progress Notes (Signed)
 Patient refusing hemodialysis. Adamant of going to his outpatient HD in am at 0600. Explained to him about the risk of not getting dialyzed. MD on call notified. Called his wife about him leaving AMA.

## 2023-12-07 NOTE — ED Notes (Signed)
Walked patient to the bathroom patient did well 

## 2023-12-07 NOTE — ED Provider Notes (Signed)
 [facility] AT [facility] Provider Note   CSN: [id number] Arrival date & time: 12/07/23  0636     History  Chief Complaint  Patient presents with   Shortness of Breath    Ronald Reeves is a 68 y.o. male with medical history of ESRD dialysis Tuesday Thursday Saturday.  Patient presents to ED for evaluation of shortness of breath and leg swelling.  He reports that he last dialyzed on Thursday this past week.  He attempted to go on Saturday but states that he was told that he had a clot in his fistula and they were unable to dialyze him.  He arrives complaining of shortness of breath and leg swelling and states he has been this way "all day yesterday".  Patient really falling asleep, unable to tell me why.  Reports cocaine  use yesterday as well as marijuana use.  Denies alcohol.  Denies chest pain, nausea vomiting, fevers, dysuria.  Reports he still makes urine.  States he is up 3 pounds.   Shortness of Breath Associated symptoms: no chest pain, no fever and no vomiting        Home Medications Prior to Admission medications   Medication Sig Start Date End Date Taking? Authorizing Provider  albuterol  (VENTOLIN  HFA) 108 (90 Base) MCG/ACT inhaler Inhale 2 puffs into the lungs every 6 (six) hours as needed (COPD).    [provider]  amLODipine  (NORVASC ) 10 MG tablet Take 1 tablet (10 mg total) by mouth daily. Patient taking differently: Take 10 mg by mouth daily. Hold for systolic BP <100 3/55/73   Lenise Quince, MD  apixaban  (ELIQUIS ) 5 MG TABS tablet Take 1 tablet (5 mg total) by mouth 2 (two) times daily. 03/06/23   Atway, Rayann N, DO  ARIPiprazole  (ABILIFY ) 5 MG tablet Take 1 tablet (5 mg total) by mouth daily. 11/20/23   Ghimire, Estil Heman, MD  B Complex-C-Folic Acid  (DIALYVITE TABLET) TABS Take 1 tablet by mouth daily. 12/23/22   [provider]  carvedilol  (COREG ) 12.5 MG tablet Take 12.5 mg by mouth 2 (two) times daily with a meal.     [provider]  escitalopram  (LEXAPRO ) 20 MG tablet Take 0.5 tablets (10 mg total) by mouth at bedtime. 11/19/23   Ghimire, Estil Heman, MD  ferric citrate (AURYXIA) 1 GM 210 MG(Fe) tablet Take 210 mg by mouth 3 (three) times daily with meals.    [provider]  furosemide  (LASIX ) 40 MG tablet Take 40 mg by mouth daily.    [provider]  hydrALAZINE  (APRESOLINE ) 25 MG tablet Take 25 mg by mouth 2 (two) times daily.    [provider]  isosorbide  mononitrate (IMDUR ) 30 MG 24 hr tablet Take 30 mg by mouth daily.    [provider]  lidocaine  (LIDODERM ) 5 % Place 2 patches onto the skin daily. Remove & Discard patch within 12 hours or as directed by MD    [provider]  naloxone  (NARCAN ) nasal spray 4 mg/0.1 mL Place 0.4 mg into the nose See admin instructions.  SPRAY 1 SPRAY INTO ONE NOSTRIL AS DIRECTED AS NEEDED FOR RESCUE FOR OPIOID OVERDOSE - CALL 911 IMMEDIATELY, ADMINISTER DOSE, THEN TURN PERSON ON SIDE - IF NO RESPONSE IN 2-3 MINUTES OR PERSON RESPONDS BUT RELAPSES, REPEAT USING A NEW SPRAY DEVICE AND SPRAY INTO THE OTHER NOSTRIL AS NEEDED FOR RESCUE 01/21/23   [provider]  omeprazole  (PRILOSEC) 40 MG capsule Take 1 capsule (40 mg total)  by mouth daily. Patient taking differently: Take 40 mg by mouth daily as needed. 06/06/22   Kennedy-Smith, Colleen M, NP  ondansetron  (ZOFRAN -ODT) 8 MG disintegrating tablet Take 8 mg by mouth every 6 (six) hours. 11/06/23   [provider]  oxyCODONE -acetaminophen  (PERCOCET/ROXICET) 5-325 MG tablet Take 1 tablet by mouth every 4 (four) hours as needed for severe pain (pain score 7-10). 10/20/23   Carlene Che, MD  PSYLLIUM PO Take by mouth. 09/03/23   [provider]  rosuvastatin  (CRESTOR ) 10 MG tablet Take 10 mg by mouth daily.    [provider]  sevelamer  carbonate (RENVELA ) 800 MG tablet Take 1,600 mg by mouth 3 (three) times daily with meals. 01/06/23   [provider]  sorbitol 70 % solution Take 30 mLs by mouth 3 (three) times daily as needed (constipation). 01/20/23   [provider]  tamsulosin  (FLOMAX ) 0.4 MG CAPS capsule Take 0.4 mg by mouth at bedtime.    [provider]  Tiotropium Bromide-Olodaterol 2.5-2.5 MCG/ACT AERS Take 2 puffs by mouth in the morning. 08/07/23   [provider]      Allergies    Enalapril , Gabapentin , and Iodinated contrast media    Review of Systems   Review of Systems  Constitutional:  Negative for fever.  Respiratory:  Positive for shortness of breath.   Cardiovascular:  Positive for leg swelling. Negative for chest pain.  Gastrointestinal:  Negative for nausea and vomiting.  All other systems reviewed and are negative.   Physical Exam Updated Vital Signs BP (!) 168/85 (BP Location: Right Leg)   Pulse 93   Temp 97.7 F (36.5 C) (Oral)   Resp (!) 24   SpO2 99%  Physical Exam Vitals and nursing note reviewed.  Constitutional:      General: He is not in acute distress.    Appearance: He is well-developed.  HENT:     Head: Normocephalic and atraumatic.  Eyes:     Conjunctiva/sclera: Conjunctivae normal.  Cardiovascular:     Rate and Rhythm: Normal rate and regular rhythm.     Heart sounds: No murmur heard. Pulmonary:     Effort: Pulmonary effort is normal. No respiratory distress.     Breath sounds: Rales present.  Abdominal:     Palpations: Abdomen is soft.     Tenderness: There is no abdominal tenderness.  Musculoskeletal:        General: No swelling.     Cervical back: Neck supple.     Right lower leg: Edema present.     Left lower leg: Edema present.  Skin:    General: Skin is warm and dry.     Capillary Refill: Capillary refill takes less than 2 seconds.  Neurological:     Mental Status: He is alert.  Psychiatric:        Mood and Affect: Mood normal.     ED Results / Procedures / Treatments   Labs (all labs ordered are listed, but only abnormal  results are displayed) Labs Reviewed  CBC - Abnormal; Notable for the following components:      Result Value   RDW 17.2 (*)    Platelets 124 (*)    All other components within normal limits  BASIC METABOLIC PANEL WITH GFR - Abnormal; Notable for the following components:   Potassium 5.5 (*)    Chloride 95 (*)    Glucose, Bld 106 (*)    BUN 48 (*)    Creatinine, Ser 9.93 (*)  GFR, Estimated 5 (*)    Anion gap 20 (*)    All other components within normal limits  I-STAT VENOUS BLOOD GAS, ED - Abnormal; Notable for the following components:   pH, Ven 7.451 (*)    Bicarbonate 32.7 (*)    TCO2 34 (*)    Acid-Base Excess 7.0 (*)    Potassium 5.7 (*)    Calcium , Ion 1.00 (*)    All other components within normal limits  I-STAT CHEM 8, ED - Abnormal; Notable for the following components:   Potassium 5.7 (*)    BUN 63 (*)    Creatinine, Ser 10.50 (*)    Glucose, Bld 104 (*)    Calcium , Ion 0.99 (*)    All other components within normal limits  HIV ANTIBODY (ROUTINE TESTING W REFLEX)  CBC  CREATININE, SERUM    EKG EKG Interpretation Date/Time:  Monday December 07 2023 06:47:34 EDT Ventricular Rate:  89 PR Interval:  202 QRS Duration:  108 QT Interval:  406 QTC Calculation: 494 R Axis:   -40  Text Interpretation: Sinus rhythm Biatrial enlargement Left anterior fascicular block Anterior infarct, old No significant change since last tracing Confirmed by Eldon Greenland ([id number]) on 12/07/2023 6:54:40 AM  Radiology DG Chest Portable 1 View Result Date: 12/07/2023 CLINICAL DATA:  Shortness of breath.  Dialysis dependent. EXAM: PORTABLE CHEST 1 VIEW COMPARISON:  11/16/2023 FINDINGS: The cardio pericardial silhouette is enlarged. Vascular congestion noted with basilar predominant airspace disease suggesting edema. No substantial pleural effusion. No acute bony abnormality. Telemetry leads overlie the chest. IMPRESSION: Enlargement of the cardiopericardial silhouette with basilar  predominant airspace disease suggesting edema. Electronically Signed   By: Donnal Fusi M.D.   On: 12/07/2023 07:49    Procedures Procedures   Medications Ordered in ED Medications  furosemide  (LASIX ) injection 80 mg (has no administration in time range)  heparin  injection 5,000 Units (has no administration in time range)  Chlorhexidine  Gluconate Cloth 2 % PADS 6 each (has no administration in time range)  furosemide  (LASIX ) injection 40 mg (40 mg Intravenous Given 12/07/23 0820)  albuterol  (VENTOLIN  HFA) 108 (90 Base) MCG/ACT inhaler 2 puff (2 puffs Inhalation Given 12/07/23 0821)  sodium zirconium cyclosilicate (LOKELMA) packet 5 g (5 g Oral Given 12/07/23 0981)    ED Course/ Medical Decision Making/ A&P Clinical Course as of 12/07/23 1443  Mon Dec 07, 2023  1408 vascular catheter in his right groin.  [CG]  1419 Dr Marlena Sima  [CG]    Clinical Course User Index [CG] Adel Aden, PA-C   Medical Decision Making Amount and/or Complexity of Data Reviewed Labs: ordered. Radiology: ordered.  Risk Prescription drug management.   68 year old male presents for evaluation.  Please see HPI for further details.  On examination the patient is afebrile, nontachycardic.  Lung sounds have rales throughout, patient is on 4 L of oxygen via nasal cannula with oxygen saturation 99%.  Patient abdomen is soft and compressible.  Lower extremity has 1+ nonpitting edema.  Neurological examinations at baseline.  Patient overall is slightly somnolent on examination, reports he did not sleep well last night.  He also endorses recent cocaine  use as well as marijuana.  Will collect CBC, BMP, i-STAT Chem-8, i-STAT VBG, chest x-ray.  Patient CBC without leukocytosis or anemia.  Metabolic panel with potassium 5.5, creatinine 9.93, anion gap 20, GFR 5.  Patient given albuterol  inhaler, Lokelma 5 g for hyperkalemia.  Also given 40 mg of Lasix  for diuresis, patient reports he still  makes  urine.  Discussed patient case with Dr. Jonah Negus of nephrology.  He has requested consult with interventional radiology due to the patient having a clogged dialysis catheter.   Have consulted with Dr. Marlena Sima who has agreed to consult on patient while admitted.  I discussed patient with Dr. Sulema Endo [facility] hospitalist who agreed to meet patient.  Patient stable at time of admission.   Final Clinical Impression(s) / ED Diagnoses Final diagnoses:  Encounter for dialysis [facility])    Rx / DC Orders ED Discharge Orders     None         Adel Aden, PA-C 12/07/23 1443    Albertus Hughs, DO 12/07/23 1459

## 2023-12-07 NOTE — Progress Notes (Signed)
 Security came up to bring his wife. Patient was very rude to his wife that security have  to call for back up. Another RN came to see him and he said "NO, means NO!" Patient requested for AMA form and signed it.

## 2023-12-07 NOTE — ED Notes (Signed)
 Nephrology at bedside

## 2023-12-07 NOTE — ED Notes (Signed)
 Patient is now back in bed on there monitor with call bell in reach

## 2023-12-07 NOTE — Consult Note (Signed)
 Renal Service Consult Note [facility]  Ronald Reeves 12/07/2023 Lynae Sandifer, MD Requesting Physician: Dr. Inga Manges  Reason for Consult: ESRD pt w/ clotted access, SOB, ^K+ HPI: The patient is a 68 y.o. year-old w/ PMH as below who presented to ED today c/o "clotted fistula", missed HD Saturday. Not feeling well, +cough and SOB. In ED VSS, on 4L [place] O2 and CXR shows pulm edema. K+ 5.7, BUN 63.  We are asked to see for dialysis.    Pt seen in ED. Pt is lethargic and hard to wake up, but he did wake up and is Ox3 (did not open his eyes). Denies CP or fevers, no prod cough. +SOB endorsed.    ROS - denies CP, no joint pain, no HA, no blurry vision, no rash, no diarrhea, no nausea/ vomiting  PMH: Anxiety Anemia COPD CAD ESRD on HD GERD HTN History of substance abuse   Past Surgical History  Past Surgical History:  Procedure Laterality Date   A/V FISTULAGRAM Right 06/17/2023   Procedure: A/V Fistulagram;  Surgeon: Kayla Part, MD;  Location: [facility] INVASIVE CV LAB;  Service: Cardiovascular;  Laterality: Right;   A/V FISTULAGRAM Right 07/17/2023   Procedure: A/V Fistulagram;  Surgeon: Carlene Che, MD;  Location: [facility] INVASIVE CV LAB;  Service: Cardiovascular;  Laterality: Right;   AV FISTULA PLACEMENT Left 04/12/2021   Procedure: LEFT ARM ARTERIOVENOUS (AV) FISTULA CREATION;  Surgeon: Young Hensen, MD;  Location: [facility] OR;  Service: Vascular;  Laterality: Left;   AV FISTULA PLACEMENT Left 10/18/2021   Procedure: LEFT ARM BRACHIOBASILIC FISTULA CREATION FIRST STAGE;  Surgeon: Young Hensen, MD;  Location: [facility] OR;  Service: Vascular;  Laterality: Left;   AV FISTULA PLACEMENT Right 11/28/2022   Procedure: RIGHT ARM FIRST STAGE BRACHIOBASILIC FISTULA CREATION;  Surgeon: Young Hensen, MD;  Location: [facility] OR;  Service: Vascular;  Laterality: Right;   AV FISTULA PLACEMENT Right 10/19/2023   Procedure: RIGHT ARM ARTERIOVENOUS GRAFT CREATION;  Surgeon: Carlene Che, MD;  Location: [facility] OR;  Service: Vascular;  Laterality: Right;   BACK SURGERY     BASCILIC VEIN TRANSPOSITION Left 01/27/2022   Procedure: LEFT SECOND STAGE BASILIC VEIN TRANSPOSITION;  Surgeon: Young Hensen, MD;  Location: [facility] OR;  Service: Vascular;  Laterality: Left;   BASCILIC VEIN TRANSPOSITION Right 02/18/2023   Procedure: SECOND STAGE RIGHT ARM BASILIC VEIN TRANSPOSITION;  Surgeon: Young Hensen, MD;  Location: [facility] OR;  Service: Vascular;  Laterality: Right;   BIOPSY  08/20/2022   Procedure: BIOPSY;  Surgeon: Annis Kinder, DO;  Location: [facility] ENDOSCOPY;  Service: Gastroenterology;;   COLONOSCOPY WITH PROPOFOL  N/A 08/20/2022   Procedure: COLONOSCOPY WITH PROPOFOL ;  Surgeon: Annis Kinder, DO;  Location: [facility]OSCOPY;  Service: Gastroenterology;  Laterality: N/A;   DIALYSIS/PERMA CATHETER INSERTION Right 03/05/2023   Procedure: DIALYSIS/PERMA CATHETER INSERTION;  Surgeon: Young Hensen, MD;  Location: [facility] INVASIVE CV LAB;  Service: Cardiovascular;  Laterality: Right;   ESOPHAGOGASTRODUODENOSCOPY (EGD) WITH PROPOFOL  N/A 08/20/2022   Procedure: ESOPHAGOGASTRODUODENOSCOPY (EGD) WITH PROPOFOL ;  Surgeon: Annis Kinder, DO;  Location: [facility];  Service: Gastroenterology;  Laterality: N/A;   INSERTION OF DIALYSIS CATHETER Right 05/01/2022   Procedure: INSERTION OF DIALYSIS CATHETER;  Surgeon: Carlene Che, MD;  Location: [facility] OR;  Service: Vascular;  Laterality: Right;   INSERTION OF DIALYSIS CATHETER Right 07/10/2023   Procedure: ULTRASOUNDED GUIDED INSERTION OF TUNNELED  DIALYSIS CATHETER TO RIGHT FEMORAL VEIN;  Surgeon: Jimmye Moulds  J, MD;  Location: [facility] OR;  Service: Vascular;  Laterality: Right;   IR REMOVAL TUN CV CATH W/O FL  12/19/2022   IR REMOVAL TUN CV CATH W/O FL  11/18/2023   LIGATION OF COMPETING BRANCHES OF ARTERIOVENOUS FISTULA Left 07/15/2021   Procedure: LIGATION OF COMPETING BRANCHES OF LEFT RADIOCEPHALIC ARTERIOVENOUS FISTULA TIMES  TWO;  Surgeon: Young Hensen, MD;  Location: [facility] OR;  Service: Vascular;  Laterality: Left;   PERIPHERAL VASCULAR BALLOON ANGIOPLASTY  07/17/2023   Procedure: PERIPHERAL VASCULAR BALLOON ANGIOPLASTY;  Surgeon: Carlene Che, MD;  Location: [facility] INVASIVE CV LAB;  Service: Cardiovascular;;   PERIPHERAL VASCULAR INTERVENTION  06/17/2023   Procedure: PERIPHERAL VASCULAR INTERVENTION;  Surgeon: Kayla Part, MD;  Location: [facility] INVASIVE CV LAB;  Service: Cardiovascular;;   POLYPECTOMY  08/20/2022   Procedure: POLYPECTOMY;  Surgeon: Annis Kinder, DO;  Location: [facility];  Service: Gastroenterology;;   REVISON OF ARTERIOVENOUS FISTULA Left 07/15/2021   Procedure: REVISON OF LEFT ARTERIOVENOUS FISTULA;  Surgeon: Young Hensen, MD;  Location: [facility] OR;  Service: Vascular;  Laterality: Left;   REVISON OF ARTERIOVENOUS FISTULA Left 05/01/2022   Procedure: ARTERIOVENOUS FISTULA WASHOUT OF ARM HEMATOMA;  Surgeon: Carlene Che, MD;  Location: [facility] OR;  Service: Vascular;  Laterality: Left;   TUNNELLED CATHETER EXCHANGE N/A 10/23/2023   Procedure: TUNNELLED CATHETER EXCHANGE;  Surgeon: Baron Border, MD;  Location: [facility] INVASIVE CV LAB;  Service: Cardiovascular;  Laterality: N/A;   UPPER EXTREMITY VENOGRAPHY Bilateral 08/28/2023   Procedure: UPPER EXTREMITY VENOGRAPHY;  Surgeon: Carlene Che, MD;  Location: [facility] INVASIVE CV LAB;  Service: Cardiovascular;  Laterality: Bilateral;   Family History  Family History  Problem Relation Age of Onset   Heart disease Mother    Heart attack Sister 22   Social History  reports that he has been smoking cigars and cigarettes. He has been exposed to tobacco smoke. He has never used smokeless tobacco. He reports that he does not currently use alcohol after a past usage of about 14.0 standard drinks of alcohol per week. He reports current drug use. Frequency: 1.00 time per week. Drugs: Marijuana and Cocaine . Allergies  Allergies  Allergen Reactions    Enalapril  Other (See Comments)    Renal failure syndrome   Gabapentin      Encephalopathy, tremor   Iodinated Contrast Media Nausea And Vomiting   Home medications Prior to Admission medications   Medication Sig Start Date End Date Taking? Authorizing Provider  albuterol  (VENTOLIN  HFA) 108 (90 Base) MCG/ACT inhaler Inhale 2 puffs into the lungs every 6 (six) hours as needed (COPD).    [provider]  amLODipine  (NORVASC ) 10 MG tablet Take 1 tablet (10 mg total) by mouth daily. Patient taking differently: Take 10 mg by mouth daily. Hold for systolic BP <100 5/64/33   Lenise Quince, MD  apixaban  (ELIQUIS ) 5 MG TABS tablet Take 1 tablet (5 mg total) by mouth 2 (two) times daily. 03/06/23   Atway, Rayann N, DO  ARIPiprazole  (ABILIFY ) 5 MG tablet Take 1 tablet (5 mg total) by mouth daily. 11/20/23   Ghimire, Estil Heman, MD  B Complex-C-Folic Acid  (DIALYVITE TABLET) TABS Take 1 tablet by mouth daily. 12/23/22   [provider]  carvedilol  (COREG ) 12.5 MG tablet Take 12.5 mg by mouth 2 (two) times daily with a meal.    [provider]  escitalopram  (LEXAPRO ) 20 MG tablet Take 0.5 tablets (10 mg total) by mouth at bedtime. 11/19/23  Ghimire, Estil Heman, MD  ferric citrate (AURYXIA) 1 GM 210 MG(Fe) tablet Take 210 mg by mouth 3 (three) times daily with meals.    [provider]  furosemide  (LASIX ) 40 MG tablet Take 40 mg by mouth daily.    [provider]  hydrALAZINE  (APRESOLINE ) 25 MG tablet Take 25 mg by mouth 2 (two) times daily.    [provider]  isosorbide  mononitrate (IMDUR ) 30 MG 24 hr tablet Take 30 mg by mouth daily.    [provider]  lidocaine  (LIDODERM ) 5 % Place 2 patches onto the skin daily. Remove & Discard patch within 12 hours or as directed by MD    [provider]  naloxone  (NARCAN ) nasal spray 4 mg/0.1 mL Place 0.4 mg into the nose See admin instructions.  SPRAY 1 SPRAY INTO ONE NOSTRIL AS DIRECTED AS NEEDED  FOR RESCUE FOR OPIOID OVERDOSE - CALL 911 IMMEDIATELY, ADMINISTER DOSE, THEN TURN PERSON ON SIDE - IF NO RESPONSE IN 2-3 MINUTES OR PERSON RESPONDS BUT RELAPSES, REPEAT USING A NEW SPRAY DEVICE AND SPRAY INTO THE OTHER NOSTRIL AS NEEDED FOR RESCUE 01/21/23   [provider]  omeprazole  (PRILOSEC) 40 MG capsule Take 1 capsule (40 mg total) by mouth daily. Patient taking differently: Take 40 mg by mouth daily as needed. 06/06/22   Kennedy-Smith, Colleen M, NP  ondansetron  (ZOFRAN -ODT) 8 MG disintegrating tablet Take 8 mg by mouth every 6 (six) hours. 11/06/23   [provider]  oxyCODONE -acetaminophen  (PERCOCET/ROXICET) 5-325 MG tablet Take 1 tablet by mouth every 4 (four) hours as needed for severe pain (pain score 7-10). 10/20/23   Carlene Che, MD  PSYLLIUM PO Take by mouth. 09/03/23   [provider]  rosuvastatin  (CRESTOR ) 10 MG tablet Take 10 mg by mouth daily.    [provider]  sevelamer  carbonate (RENVELA ) 800 MG tablet Take 1,600 mg by mouth 3 (three) times daily with meals. 01/06/23   [provider]  sorbitol 70 % solution Take 30 mLs by mouth 3 (three) times daily as needed (constipation). 01/20/23   [provider]  tamsulosin  (FLOMAX ) 0.4 MG CAPS capsule Take 0.4 mg by mouth at bedtime.    [provider]  Tiotropium Bromide-Olodaterol 2.5-2.5 MCG/ACT AERS Take 2 puffs by mouth in the morning. 08/07/23   [provider]     Vitals:   12/07/23 0733 12/07/23 1015 12/07/23 1051 12/07/23 1413  BP: (!) 150/84 (!) 142/112 (!) 165/94 (!) 168/85  Pulse: 87  87 93  Resp: 10  18 (!) 24  Temp: 97.6 F (36.4 C)  97.6 F (36.4 C) 97.7 F (36.5 C)  TempSrc: Oral  Oral Oral  SpO2: 100%  100% 99%   Exam Gen somnolent, awakens to loud voice, answers simple questions No rash, cyanosis or gangrene Sclera anicteric, throat clear  No jvd or bruits Chest bilateral fine crackles at the bases and some expiratory wheezing RRR  no MRG Abd soft ntnd no mass or ascites +bs GU normal male MS no joint effusions or deformity Ext 1+ LE  edema, no other edema Neuro as above    R upper AVG no bruit       Renal-related home meds: Norvasc  10 mg Coreg  12.5 twice daily Dialyvite once daily Auryxia 210 mg 3 times daily with meals Lasix  40 mg daily Hydralazine  25 twice daily Renvela  2 AC 3 times daily   OP HD: TTS NW  4h   B400   63.5kg  RUE AVG    Heparin  2500 Good compliance but missed Sat Gets to dry wt half the time Rocaltrol  2.0 mcg three times per week    Assessment/ Plan: Clotted HD graft: no other access available, consulting IR per ED.  Needs declot versus temp HD cath.  Patient has mild hyperkalemia and also pulmonary edema with mild increased work of breathing.   Missed HD Sat.  Hopefully can get dialysis tonight SOB/ pulm edema: as above Hyperkalemia: K+ 5.7, temp measures per ED.  ESRD: on HD TTS. Missed Sat HD. Here w/ SOB, K+ 5.7. Orders placed for HD once access can be attained.  HTN: Bp's controlled. Per pmd.  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  Secondary hyperparathyroidism: Ca in range, cont binders w/ meals.      Larry Poag  MD [facility] 12/07/2023, 2:22 PM  Recent Labs  Lab 12/07/23 0647 12/07/23 0652  HGB 13.5 15.0  15.3  CALCIUM  9.3  --   CREATININE 9.93* 10.50*  K 5.5* 5.7*  5.7*   Inpatient medications:

## 2023-12-07 NOTE — H&P (Addendum)
 History and Physical    Patient: Ronald Reeves WUJ:[id number] DOB: 08-Sep-1955 DOA: 12/07/2023 DOS: the patient was seen and examined on 12/07/2023 PCP: Doak Free, MD  Patient coming from: Home  Chief Complaint:  Chief Complaint  Patient presents with   Shortness of Breath   HPI: Ronald Reeves is a 68 y.o. male with medical history significant of HTN, GERD and ESRD p/w AHRF 2/2 volume overload iso missed hemodialysis due to malfunctioning fistula.  Pt states that he was in his USOH until this Saturday when he presented to his OP HD center for regularly scheduled dialysis. Pt was unable to complete dialysis due to his fistula being clotted off. Pt was advised to report to the nearest ED, and did so today after becoming increasingly SOB. Pt states that he has been able to perform his regular ADLs despite increased DOE. Denies cp or f/c.  In the ED, the patient was evaluated by nephrology who plan to dialyze pt after IR baloon angioplasty of RUE fistula; as such, pt is admitted to medicine for ongoing monitoring and work-up.   Review of Systems: As mentioned in the history of present illness. All other systems reviewed and are negative.  Past Medical History:  Diagnosis Date   Anemia    Anxiety    Arthritis    Back pain    Bronchitis    COPD (chronic obstructive pulmonary disease) (HCC)    Coronary artery disease    COVID    mild - flu like symptoms   Dyspnea    w/ exertion, uses inhaler   ESRD on hemodialysis (HCC)    dialysis on tues, thurs, sat at [facility]   GERD (gastroesophageal reflux disease)    not a current problem   Heart murmur    never has caused any problems   Hypertension    Myocardial infarction (HCC)    Pneumonia    x 1   Pre-diabetes    diet controlled, no meds, does not check blood sugar   Substance abuse [facility])    Past Surgical History:  Procedure Laterality Date   A/V FISTULAGRAM Right 06/17/2023   Procedure: A/V Fistulagram;  Surgeon: Kayla Part, MD;  Location: [facility] INVASIVE CV LAB;  Service: Cardiovascular;  Laterality: Right;   A/V FISTULAGRAM Right 07/17/2023   Procedure: A/V Fistulagram;  Surgeon: Carlene Che, MD;  Location: [facility] INVASIVE CV LAB;  Service: Cardiovascular;  Laterality: Right;   AV FISTULA PLACEMENT Left 04/12/2021   Procedure: LEFT ARM ARTERIOVENOUS (AV) FISTULA CREATION;  Surgeon: Young Hensen, MD;  Location: [facility] OR;  Service: Vascular;  Laterality: Left;   AV FISTULA PLACEMENT Left 10/18/2021   Procedure: LEFT ARM BRACHIOBASILIC FISTULA CREATION FIRST STAGE;  Surgeon: Young Hensen, MD;  Location: [facility] OR;  Service: Vascular;  Laterality: Left;   AV FISTULA PLACEMENT Right 11/28/2022   Procedure: RIGHT ARM FIRST STAGE BRACHIOBASILIC FISTULA CREATION;  Surgeon: Young Hensen, MD;  Location: [facility] OR;  Service: Vascular;  Laterality: Right;   AV FISTULA PLACEMENT Right 10/19/2023   Procedure: RIGHT ARM ARTERIOVENOUS GRAFT CREATION;  Surgeon: Carlene Che, MD;  Location: [facility] OR;  Service: Vascular;  Laterality: Right;   BACK SURGERY     BASCILIC VEIN TRANSPOSITION Left 01/27/2022   Procedure: LEFT SECOND STAGE BASILIC VEIN TRANSPOSITION;  Surgeon: Young Hensen, MD;  Location: [facility] OR;  Service: Vascular;  Laterality: Left;   BASCILIC VEIN TRANSPOSITION Right 02/18/2023   Procedure: SECOND STAGE RIGHT ARM BASILIC VEIN  TRANSPOSITION;  Surgeon: Young Hensen, MD;  Location: [facility] OR;  Service: Vascular;  Laterality: Right;   BIOPSY  08/20/2022   Procedure: BIOPSY;  Surgeon: Annis Kinder, DO;  Location: [facility];  Service: Gastroenterology;;   COLONOSCOPY WITH PROPOFOL  N/A 08/20/2022   Procedure: COLONOSCOPY WITH PROPOFOL ;  Surgeon: Annis Kinder, DO;  Location: [facility];  Service: Gastroenterology;  Laterality: N/A;   DIALYSIS/PERMA CATHETER INSERTION Right 03/05/2023   Procedure: DIALYSIS/PERMA CATHETER INSERTION;  Surgeon: Young Hensen, MD;  Location: [facility] INVASIVE CV LAB;   Service: Cardiovascular;  Laterality: Right;   ESOPHAGOGASTRODUODENOSCOPY (EGD) WITH PROPOFOL  N/A 08/20/2022   Procedure: ESOPHAGOGASTRODUODENOSCOPY (EGD) WITH PROPOFOL ;  Surgeon: Annis Kinder, DO;  Location: [facility];  Service: Gastroenterology;  Laterality: N/A;   INSERTION OF DIALYSIS CATHETER Right 05/01/2022   Procedure: INSERTION OF DIALYSIS CATHETER;  Surgeon: Carlene Che, MD;  Location: [facility];  Service: Vascular;  Laterality: Right;   INSERTION OF DIALYSIS CATHETER Right 07/10/2023   Procedure: ULTRASOUNDED GUIDED INSERTION OF TUNNELED  DIALYSIS CATHETER TO RIGHT FEMORAL VEIN;  Surgeon: Young Hensen, MD;  Location: [facility];  Service: Vascular;  Laterality: Right;   IR REMOVAL TUN CV CATH W/O FL  12/19/2022   IR REMOVAL TUN CV CATH W/O FL  11/18/2023   LIGATION OF COMPETING BRANCHES OF ARTERIOVENOUS FISTULA Left 07/15/2021   Procedure: LIGATION OF COMPETING BRANCHES OF LEFT RADIOCEPHALIC ARTERIOVENOUS FISTULA TIMES TWO;  Surgeon: Young Hensen, MD;  Location: [facility];  Service: Vascular;  Laterality: Left;   PERIPHERAL VASCULAR BALLOON ANGIOPLASTY  07/17/2023   Procedure: PERIPHERAL VASCULAR BALLOON ANGIOPLASTY;  Surgeon: Carlene Che, MD;  [facility] INVAS[facility] CV LAB;  Service: Cardiovascular;;   PERIPHERAL VASCULAR INTERVENTION  06/17/2023   Procedure: PERIPHERAL VASCULAR INTERVENTION;  Surgeon: Kayla Part, MD;  Location: [facility] INVASIVE CV LAB;  Service: Cardiovascular;;   POLYPECTOMY  08/20/2022   Procedure: POLYPECTOMY;  Surgeon: Annis Kinder, DO;  Location: [facility];  Service: Gastroenterology;;   REVISON OF ARTERIOVENOUS FISTULA Left 07/15/2021   Procedure: REVISON OF LEFT ARTERIOVENOUS FISTULA;  Surgeon: Young Hensen, MD;  Location: [facility] OR;  Service: Vascular;  Laterality: Left;   REVISON OF ARTERIOVENOUS FISTULA Left 05/01/2022   Procedure: ARTERIOVENOUS FISTULA WASHOUT OF ARM HEMATOMA;  Surgeon: Carlene Che, MD;  Location: [facility] OR;   Service: Vascular;  Laterality: Left;   TUNNELLED CATHETER EXCHANGE N/A 10/23/2023   Procedure: TUNNELLED CATHETER EXCHANGE;  Surgeon: Baron Border, MD;  Location: [facility] INVASIVE CV LAB;  Service: Cardiovascular;  Laterality: N/A;   UPPER EXTREMITY VENOGRAPHY Bilateral 08/28/2023   Procedure: UPPER EXTREMITY VENOGRAPHY;  Surgeon: Carlene Che, MD;  Location: [facility] INVASIVE CV LAB;  Service: Cardiovascular;  Laterality: Bilateral;   Social History:  reports that he has been smoking cigars and cigarettes. He has been exposed to tobacco smoke. He has never used smokeless tobacco. He reports that he does not currently use alcohol after a past usage of about 14.0 standard drinks of alcohol per week. He reports current drug use. Frequency: 1.00 time per week. Drugs: Marijuana and Cocaine .  Allergies  Allergen Reactions   Enalapril  Other (See Comments)    Renal failure syndrome   Gabapentin      Encephalopathy, tremor   Iodinated Contrast Media Nausea And Vomiting    Family History  Problem Relation Age of Onset   Heart disease Mother    Heart attack Sister 13    Prior to Admission medications  Medication Sig Start Date End Date Taking? Authorizing Provider  albuterol  (VENTOLIN  HFA) 108 (90 Base) MCG/ACT inhaler Inhale 2 puffs into the lungs every 6 (six) hours as needed (COPD).    [provider]  amLODipine  (NORVASC ) 10 MG tablet Take 1 tablet (10 mg total) by mouth daily. Patient taking differently: Take 10 mg by mouth daily. Hold for systolic BP <100 08/29/12   Lenise Quince, MD  apixaban  (ELIQUIS ) 5 MG TABS tablet Take 1 tablet (5 mg total) by mouth 2 (two) times daily. 03/06/23   Atway, Rayann N, DO  ARIPiprazole  (ABILIFY ) 5 MG tablet Take 1 tablet (5 mg total) by mouth daily. 11/20/23   Ghimire, Estil Heman, MD  B Complex-C-Folic Acid  (DIALYVITE TABLET) TABS Take 1 tablet by mouth daily. 12/23/22   [provider]  carvedilol  (COREG ) 12.5 MG tablet Take 12.5 mg by  mouth 2 (two) times daily with a meal.    [provider]  escitalopram  (LEXAPRO ) 20 MG tablet Take 0.5 tablets (10 mg total) by mouth at bedtime. 11/19/23   Ghimire, Estil Heman, MD  ferric citrate (AURYXIA) 1 GM 210 MG(Fe) tablet Take 210 mg by mouth 3 (three) times daily with meals.    [provider]  furosemide  (LASIX ) 40 MG tablet Take 40 mg by mouth daily.    [provider]  hydrALAZINE  (APRESOLINE ) 25 MG tablet Take 25 mg by mouth 2 (two) times daily.    [provider]  isosorbide  mononitrate (IMDUR ) 30 MG 24 hr tablet Take 30 mg by mouth daily.    [provider]  lidocaine  (LIDODERM ) 5 % Place 2 patches onto the skin daily. Remove & Discard patch within 12 hours or as directed by MD    [provider]  naloxone  (NARCAN ) nasal spray 4 mg/0.1 mL Place 0.4 mg into the nose See admin instructions.  SPRAY 1 SPRAY INTO ONE NOSTRIL AS DIRECTED AS NEEDED FOR RESCUE FOR OPIOID OVERDOSE - CALL 911 IMMEDIATELY, ADMINISTER DOSE, THEN TURN PERSON ON SIDE - IF NO RESPONSE IN 2-3 MINUTES OR PERSON RESPONDS BUT RELAPSES, REPEAT USING A NEW SPRAY DEVICE AND SPRAY INTO THE OTHER NOSTRIL AS NEEDED FOR RESCUE 01/21/23   [provider]  omeprazole  (PRILOSEC) 40 MG capsule Take 1 capsule (40 mg total) by mouth daily. Patient taking differently: Take 40 mg by mouth daily as needed. 06/06/22   Kennedy-Smith, Colleen M, NP  ondansetron  (ZOFRAN -ODT) 8 MG disintegrating tablet Take 8 mg by mouth every 6 (six) hours. 11/06/23   [provider]  oxyCODONE -acetaminophen  (PERCOCET/ROXICET) 5-325 MG tablet Take 1 tablet by mouth every 4 (four) hours as needed for severe pain (pain score 7-10). 10/20/23   Carlene Che, MD  PSYLLIUM PO Take by mouth. 09/03/23   [provider]  rosuvastatin  (CRESTOR ) 10 MG tablet Take 10 mg by mouth daily.    [provider]  sevelamer  carbonate (RENVELA ) 800 MG tablet Take 1,600 mg by mouth 3 (three)  times daily with meals. 01/06/23   [provider]  sorbitol 70 % solution Take 30 mLs by mouth 3 (three) times daily as needed (constipation). 01/20/23   [provider]  tamsulosin  (FLOMAX ) 0.4 MG CAPS capsule Take 0.4 mg by mouth at bedtime.    [provider]  Tiotropium Bromide-Olodaterol 2.5-2.5 MCG/ACT AERS Take 2 puffs by mouth in the morning. 08/07/23   [provider]    Physical Exam: Vitals:   12/07/23 7829 12/07/23 1015 12/07/23 1051 12/07/23  1413  BP: (!) 150/84 (!) 142/112 (!) 165/94 (!) 168/85  Pulse: 87  87 93  Resp: 10  18 (!) 24  Temp: 97.6 F (36.4 C)  97.6 F (36.4 C) 97.7 F (36.5 C)  TempSrc: Oral  Oral Oral  SpO2: 100%  100% 99%   General: Alert, oriented x3, resting comfortably in no acute distress Respiratory: Bibasilar rales; no wheezing Cardiovascular: Regular rate and rhythm w/o m/r/g Abdomen: Soft, nontender, nondistended. Positive bowel sounds MSK: No palpable thrill in RUE brachiocephalic fistula  Data Reviewed:  Lab Results  Component Value Date   WBC 6.3 12/07/2023   HGB 15.0 12/07/2023   HGB 15.3 12/07/2023   HCT 44.0 12/07/2023   HCT 45.0 12/07/2023   MCV 85.0 12/07/2023   PLT 124 (L) 12/07/2023   Lab Results  Component Value Date   GLUCOSE 104 (H) 12/07/2023   CALCIUM  9.3 12/07/2023   NA 138 12/07/2023   NA 138 12/07/2023   K 5.7 (H) 12/07/2023   K 5.7 (H) 12/07/2023   CO2 25 12/07/2023   CL 99 12/07/2023   BUN 63 (H) 12/07/2023   CREATININE 10.50 (H) 12/07/2023   Lab Results  Component Value Date   ALT 11 11/16/2023   AST 21 11/16/2023   ALKPHOS 80 11/16/2023   BILITOT 0.6 11/16/2023   Lab Results  Component Value Date   INR 1.3 (H) 03/18/2023   INR 1.2 03/13/2023   INR 1.1 12/16/2022   Radiology: DG Chest Portable 1 View Result Date: 12/07/2023 CLINICAL DATA:  Shortness of breath.  Dialysis dependent. EXAM: PORTABLE CHEST 1 VIEW COMPARISON:  11/16/2023 FINDINGS: The cardio  pericardial silhouette is enlarged. Vascular congestion noted with basilar predominant airspace disease suggesting edema. No substantial pleural effusion. No acute bony abnormality. Telemetry leads overlie the chest. IMPRESSION: Enlargement of the cardiopericardial silhouette with basilar predominant airspace disease suggesting edema. Electronically Signed   By: Donnal Fusi M.D.   On: 12/07/2023 07:49   Assessment and Plan: Gonsalo Barquin is a 68 y.o. male with medical history significant of HTN, GERD and ESRD p/w AHRF 2/2 volume overload iso missed hemodialysis due to malfunctioning fistula.  #AHRF 2/2 volume overload iso missed hemodialysis due to malfunctioning fistula, new, poa #AV fistula occlusion #Hyperkalemia -IR consulted; apprec recs -Renal consulted; apprec recs -S/p lokelma in ED -PTA renvela  1600mg  TID -PTA lasix  increased 40-->80mg  x1 iso increased DOE -Consider daily ASA 81mg  at discharge -F/u lipid panel to determine if pt would benefit from statin therapy as well  #H/o HTN -PTA coreg , imdur , amlodipine , and lasix   #H/o GERD - PTA PPI   Advance Care Planning:   Code Status: Full Code   Consults: Renal and IR  Family Communication: Wife, Kaheem Venhaus, updated at [phone]   Severity of Illness: The appropriate patient status for this patient is OBSERVATION. Observation status is judged to be reasonable and necessary in order to provide the required intensity of service to ensure the patient's safety. The patient's presenting symptoms, physical exam findings, and initial radiographic and laboratory data in the context of their medical condition is felt to place them at decreased risk for further clinical deterioration. Furthermore, it is anticipated that the patient will be medically stable for discharge from the hospital within 2 midnights of admission.   ------- I spent 55 minutes reviewing previous labs/notes, obtaining separate history at the bedside,  counseling/discussing the treatment plan outlined above, ordering medications/tests, and performing clinical documentation.  Author: Arne Langdon, MD 12/07/2023 3:09 PM  For on call review [email or web address].

## 2023-12-07 NOTE — ED Notes (Signed)
 Awaiting nephrologist consult.  DO Inga Manges notified and aware of patient becoming more labored in breathing by this RN.

## 2023-12-07 NOTE — Procedures (Signed)
  Procedure:  R internal jugular HD CVC placement 16cm Preprocedure diagnosis: The encounter diagnosis was Encounter for dialysis [facility]). Postprocedure diagnosis: same EBL:    minimal Complications:   none immediate  See full dictation in [employer].  Nicky Barrack MD Main # [phone] Pager  [phone] Mobile [phone]

## 2023-12-07 NOTE — ED Notes (Signed)
 Keivon Amendola mobile [phone] patients wife in case for any updates.

## 2023-12-08 NOTE — Progress Notes (Signed)
 Late Note Entry- December 08, 2023  Chart reviewed. Contacted [facility] to be advised of pt's leaving hospital last evening and notes indicate that pt plans to go to clinic today for treatment.   Lauraine Polite Renal Navigator [phone]

## 2023-12-09 ENCOUNTER — Observation Stay (HOSPITAL_COMMUNITY)
Admission: EM | Admit: 2023-12-09 | Discharge: 2023-12-09 | Disposition: A | Discharge status: Home / Self Care | Attending: Internal Medicine | Admitting: Internal Medicine

## 2023-12-09 ENCOUNTER — Other Ambulatory Visit: Payer: Self-pay

## 2023-12-09 ENCOUNTER — Encounter (HOSPITAL_COMMUNITY): Payer: Self-pay

## 2023-12-09 ENCOUNTER — Observation Stay (HOSPITAL_COMMUNITY)

## 2023-12-09 ENCOUNTER — Observation Stay (HOSPITAL_BASED_OUTPATIENT_CLINIC_OR_DEPARTMENT_OTHER)

## 2023-12-09 ENCOUNTER — Emergency Department (HOSPITAL_COMMUNITY)

## 2023-12-09 DIAGNOSIS — Y832 Surgical operation with anastomosis, bypass or graft as the cause of abnormal reaction of the patient, or of later complication, without mention of misadventure at the time of the procedure: Secondary | ICD-10-CM | POA: Diagnosis not present

## 2023-12-09 DIAGNOSIS — T82511A Breakdown (mechanical) of surgically created arteriovenous shunt, initial encounter: Secondary | ICD-10-CM | POA: Diagnosis not present

## 2023-12-09 DIAGNOSIS — Z7901 Long term (current) use of anticoagulants: Secondary | ICD-10-CM | POA: Insufficient documentation

## 2023-12-09 DIAGNOSIS — F17210 Nicotine dependence, cigarettes, uncomplicated: Secondary | ICD-10-CM | POA: Diagnosis not present

## 2023-12-09 DIAGNOSIS — J449 Chronic obstructive pulmonary disease, unspecified: Secondary | ICD-10-CM | POA: Insufficient documentation

## 2023-12-09 DIAGNOSIS — E785 Hyperlipidemia, unspecified: Secondary | ICD-10-CM | POA: Diagnosis present

## 2023-12-09 DIAGNOSIS — E8779 Other fluid overload: Secondary | ICD-10-CM

## 2023-12-09 DIAGNOSIS — N186 End stage renal disease: Secondary | ICD-10-CM | POA: Diagnosis not present

## 2023-12-09 DIAGNOSIS — T82868A Thrombosis of vascular prosthetic devices, implants and grafts, initial encounter: Secondary | ICD-10-CM | POA: Diagnosis not present

## 2023-12-09 DIAGNOSIS — Z79899 Other long term (current) drug therapy: Secondary | ICD-10-CM | POA: Diagnosis not present

## 2023-12-09 DIAGNOSIS — I480 Paroxysmal atrial fibrillation: Secondary | ICD-10-CM | POA: Diagnosis not present

## 2023-12-09 DIAGNOSIS — I5022 Chronic systolic (congestive) heart failure: Secondary | ICD-10-CM | POA: Insufficient documentation

## 2023-12-09 DIAGNOSIS — F1910 Other psychoactive substance abuse, uncomplicated: Secondary | ICD-10-CM | POA: Diagnosis present

## 2023-12-09 DIAGNOSIS — K219 Gastro-esophageal reflux disease without esophagitis: Secondary | ICD-10-CM | POA: Diagnosis not present

## 2023-12-09 DIAGNOSIS — Z992 Dependence on renal dialysis: Secondary | ICD-10-CM | POA: Diagnosis not present

## 2023-12-09 DIAGNOSIS — I10 Essential (primary) hypertension: Secondary | ICD-10-CM | POA: Diagnosis present

## 2023-12-09 DIAGNOSIS — F19929 Other psychoactive substance use, unspecified with intoxication, unspecified: Secondary | ICD-10-CM | POA: Diagnosis not present

## 2023-12-09 DIAGNOSIS — I132 Hypertensive heart and chronic kidney disease with heart failure and with stage 5 chronic kidney disease, or end stage renal disease: Secondary | ICD-10-CM | POA: Diagnosis not present

## 2023-12-09 DIAGNOSIS — F17290 Nicotine dependence, other tobacco product, uncomplicated: Secondary | ICD-10-CM | POA: Insufficient documentation

## 2023-12-09 DIAGNOSIS — E8770 Fluid overload, unspecified: Principal | ICD-10-CM | POA: Diagnosis present

## 2023-12-09 DIAGNOSIS — D61818 Other pancytopenia: Secondary | ICD-10-CM | POA: Diagnosis not present

## 2023-12-09 DIAGNOSIS — R0602 Shortness of breath: Secondary | ICD-10-CM | POA: Diagnosis present

## 2023-12-09 HISTORY — PX: IR FLUORO GUIDE CV LINE RIGHT: IMG2283

## 2023-12-09 LAB — CBC
HCT: 36.2 % — ABNORMAL LOW (ref 39.0–52.0)
Hemoglobin: 11.6 g/dL — ABNORMAL LOW (ref 13.0–17.0)
MCH: 27.8 pg (ref 26.0–34.0)
MCHC: 32 g/dL (ref 30.0–36.0)
MCV: 86.6 fL (ref 80.0–100.0)
Platelets: 121 10*3/uL — ABNORMAL LOW (ref 150–400)
RBC: 4.18 MIL/uL — ABNORMAL LOW (ref 4.22–5.81)
RDW: 16.6 % — ABNORMAL HIGH (ref 11.5–15.5)
WBC: 4.6 10*3/uL (ref 4.0–10.5)
nRBC: 0 % (ref 0.0–0.2)

## 2023-12-09 LAB — I-STAT CHEM 8, ED
BUN: 57 mg/dL — ABNORMAL HIGH (ref 8–23)
Calcium, Ion: 0.95 mmol/L — ABNORMAL LOW (ref 1.15–1.40)
Chloride: 97 mmol/L — ABNORMAL LOW (ref 98–111)
Creatinine, Ser: 11.6 mg/dL — ABNORMAL HIGH (ref 0.61–1.24)
Glucose, Bld: 88 mg/dL (ref 70–99)
HCT: 39 % (ref 39.0–52.0)
Hemoglobin: 13.3 g/dL (ref 13.0–17.0)
Potassium: 4.2 mmol/L (ref 3.5–5.1)
Sodium: 135 mmol/L (ref 135–145)
TCO2: 26 mmol/L (ref 22–32)

## 2023-12-09 LAB — COMPREHENSIVE METABOLIC PANEL WITH GFR
ALT: 12 U/L (ref 0–44)
AST: 17 U/L (ref 15–41)
Albumin: 3.2 g/dL — ABNORMAL LOW (ref 3.5–5.0)
Alkaline Phosphatase: 70 U/L (ref 38–126)
Anion gap: 20 — ABNORMAL HIGH (ref 5–15)
BUN: 66 mg/dL — ABNORMAL HIGH (ref 8–23)
CO2: 25 mmol/L (ref 22–32)
Calcium: 9.2 mg/dL (ref 8.9–10.3)
Chloride: 93 mmol/L — ABNORMAL LOW (ref 98–111)
Creatinine, Ser: 11.37 mg/dL — ABNORMAL HIGH (ref 0.61–1.24)
GFR, Estimated: 4 mL/min — ABNORMAL LOW (ref >60)
Glucose, Bld: 92 mg/dL (ref 70–99)
Potassium: 4.2 mmol/L (ref 3.5–5.1)
Sodium: 138 mmol/L (ref 135–145)
Total Bilirubin: 0.8 mg/dL (ref 0.0–1.2)
Total Protein: 7.1 g/dL (ref 6.5–8.1)

## 2023-12-09 MED ORDER — HEPARIN SODIUM (PORCINE) 1000 UNIT/ML IJ SOLN
INTRAMUSCULAR | Status: AC
  Filled 2023-12-09: qty 3

## 2023-12-09 MED ORDER — DIALYVITE PO TABS: 1.0000 | ORAL_TABLET | Freq: Every day | ORAL | Status: DC

## 2023-12-09 MED ORDER — LIDOCAINE-EPINEPHRINE 1 %-1:100000 IJ SOLN
INTRAMUSCULAR | Status: AC
  Filled 2023-12-09: qty 1

## 2023-12-09 MED ORDER — CHLORHEXIDINE GLUCONATE CLOTH 2 % EX PADS: 6.0000 | MEDICATED_PAD | Freq: Every day | CUTANEOUS | Status: DC

## 2023-12-09 MED ORDER — LIDOCAINE-PRILOCAINE 2.5-2.5 % EX CREA: 1.0000 | TOPICAL_CREAM | CUTANEOUS | Status: DC | PRN

## 2023-12-09 MED ORDER — ACETAMINOPHEN 650 MG RE SUPP: 650.0000 mg | Freq: Four times a day (QID) | RECTAL | Status: DC | PRN

## 2023-12-09 MED ORDER — HEPARIN SODIUM (PORCINE) 1000 UNIT/ML DIALYSIS
2000.0000 [IU] | Freq: Once | INTRAMUSCULAR | Status: AC
  Administered 2023-12-09: 2000 [IU] via INTRAVENOUS_CENTRAL

## 2023-12-09 MED ORDER — HEPARIN SODIUM (PORCINE) 1000 UNIT/ML IJ SOLN
INTRAMUSCULAR | Status: AC
Start: 2023-12-09 — End: ?
  Filled 2023-12-09: qty 2

## 2023-12-09 MED ORDER — HEPARIN SODIUM (PORCINE) 1000 UNIT/ML DIALYSIS
1000.0000 [IU] | INTRAMUSCULAR | Status: DC | PRN
  Administered 2023-12-09: 2800 [IU]

## 2023-12-09 MED ORDER — LIDOCAINE HCL (PF) 1 % IJ SOLN: 5.0000 mL | INTRAMUSCULAR | Status: DC | PRN

## 2023-12-09 MED ORDER — HEPARIN SODIUM (PORCINE) 1000 UNIT/ML DIALYSIS: 1500.0000 [IU] | INTRAMUSCULAR | Status: DC | PRN

## 2023-12-09 MED ORDER — ONDANSETRON HCL 4 MG/2ML IJ SOLN
4.0000 mg | Freq: Once | INTRAMUSCULAR | Status: AC
  Administered 2023-12-09: 4 mg via INTRAVENOUS
  Filled 2023-12-09: qty 2

## 2023-12-09 MED ORDER — TAMSULOSIN HCL 0.4 MG PO CAPS: 0.4000 mg | ORAL_CAPSULE | Freq: Every day | ORAL | Status: DC

## 2023-12-09 MED ORDER — SEVELAMER CARBONATE 800 MG PO TABS: 1600.0000 mg | ORAL_TABLET | Freq: Three times a day (TID) | ORAL | Status: DC

## 2023-12-09 MED ORDER — ALBUTEROL SULFATE (2.5 MG/3ML) 0.083% IN NEBU: 2.5000 mg | INHALATION_SOLUTION | Freq: Four times a day (QID) | RESPIRATORY_TRACT | Status: DC | PRN

## 2023-12-09 MED ORDER — OXYCODONE-ACETAMINOPHEN 5-325 MG PO TABS
1.0000 | ORAL_TABLET | ORAL | Status: DC | PRN
  Administered 2023-12-09: 1 via ORAL
  Filled 2023-12-09: qty 1

## 2023-12-09 MED ORDER — ANTICOAGULANT SODIUM CITRATE 4% (200MG/5ML) IV SOLN: 5.0000 mL | Status: DC | PRN

## 2023-12-09 MED ORDER — HEPARIN SODIUM (PORCINE) 1000 UNIT/ML IJ SOLN
INTRAMUSCULAR | Status: AC
  Filled 2023-12-09: qty 10

## 2023-12-09 MED ORDER — ONDANSETRON HCL 4 MG PO TABS: 4.0000 mg | ORAL_TABLET | Freq: Four times a day (QID) | ORAL | Status: DC | PRN

## 2023-12-09 MED ORDER — APIXABAN 5 MG PO TABS: 5.0000 mg | ORAL_TABLET | Freq: Two times a day (BID) | ORAL | Status: DC

## 2023-12-09 MED ORDER — SODIUM CHLORIDE 0.9% FLUSH
3.0000 mL | Freq: Two times a day (BID) | INTRAVENOUS | Status: DC
  Administered 2023-12-09: 3 mL via INTRAVENOUS

## 2023-12-09 MED ORDER — NICOTINE 21 MG/24HR TD PT24: 21.0000 mg | MEDICATED_PATCH | Freq: Every day | TRANSDERMAL | Status: DC

## 2023-12-09 MED ORDER — ALTEPLASE 2 MG IJ SOLR: 2.0000 mg | Freq: Once | INTRAMUSCULAR | Status: DC | PRN

## 2023-12-09 MED ORDER — ACETAMINOPHEN 325 MG PO TABS: 650.0000 mg | ORAL_TABLET | Freq: Four times a day (QID) | ORAL | Status: DC | PRN

## 2023-12-09 MED ORDER — PENTAFLUOROPROP-TETRAFLUOROETH EX AERO: 1.0000 | INHALATION_SPRAY | CUTANEOUS | Status: DC | PRN

## 2023-12-09 MED ORDER — NEPRO/CARBSTEADY PO LIQD: 237.0000 mL | ORAL | Status: DC | PRN

## 2023-12-09 MED ORDER — LIDOCAINE 5 % EX PTCH: 2.0000 | MEDICATED_PATCH | CUTANEOUS | Status: DC

## 2023-12-09 MED ORDER — ONDANSETRON HCL 4 MG/2ML IJ SOLN: 4.0000 mg | Freq: Four times a day (QID) | INTRAMUSCULAR | Status: DC | PRN

## 2023-12-09 MED ORDER — FENTANYL CITRATE PF 50 MCG/ML IJ SOSY
50.0000 ug | PREFILLED_SYRINGE | Freq: Once | INTRAMUSCULAR | Status: AC
  Administered 2023-12-09: 50 ug via INTRAVENOUS
  Filled 2023-12-09: qty 1

## 2023-12-09 MED ORDER — HYDRALAZINE HCL 20 MG/ML IJ SOLN: 10.0000 mg | INTRAMUSCULAR | Status: DC | PRN

## 2023-12-09 NOTE — Progress Notes (Signed)
 Patient is requesting to be taken off at 19:30. States he will have dialysis in the morning and return for a declot of right upper arm access after dialysis. I have tried to convince him that he has not had dialysis in five days, and is complaining of shortness of breath. Patient is adamant that he will be going home at 19:30. AMA signed. Md notification.  Patient is also aware that he a has a temp catheter in place.

## 2023-12-09 NOTE — ED Provider Notes (Signed)
 [facility] DEPT [facility] Emergency Department Provider Note MRN:  [id number]  Arrival date & time: 12/09/23     Chief Complaint   Shortness of Breath   History of Present Illness   Ronald Reeves is a 68 y.o. year-old male with a history of ESRD presenting to the ED with chief complaint of shortness of breath.  Worsening shortness of breath over the past few days.  Explains that he missed dialysis a few times.  He had his normal dialysis Thursday of last week.  He went back on Saturday but his fistula was not working and so he did not receive dialysis.  He had an interventional radiologist to place a new temporary HD line 2 days ago.  He explains that yesterday he went for dialysis but the knew HD line also not working.  Review of Systems  A thorough review of systems was obtained and all systems are negative except as noted in the HPI and PMH.   Patient's Health History    Past Medical History:  Diagnosis Date   Anemia    Anxiety    Arthritis    Back pain    Bronchitis    COPD (chronic obstructive pulmonary disease) (HCC)    Coronary artery disease    COVID    mild - flu like symptoms   Dyspnea    w/ exertion, uses inhaler   ESRD on hemodialysis (HCC)    dialysis on tues, thurs, sat at [facility]   GERD (gastroesophageal reflux disease)    not a current problem   Heart murmur    never has caused any problems   Hypertension    Myocardial infarction (HCC)    Pneumonia    x 1   Pre-diabetes    diet controlled, no meds, does not check blood sugar   Substance abuse [facility])     Past Surgical History:  Procedure Laterality Date   A/V FISTULAGRAM Right 06/17/2023   Procedure: A/V Fistulagram;  Surgeon: Kayla Part, MD;  Location: [facility] INVASIVE CV LAB;  Service: Cardiovascular;  Laterality: Right;   A/V FISTULAGRAM Right 07/17/2023   Procedure: A/V Fistulagram;  Surgeon: Carlene Che, MD;  Location: [facility] INVASIVE CV LAB;  Service: Cardiovascular;  Laterality: Right;   AV  FISTULA PLACEMENT Left 04/12/2021   Procedure: LEFT ARM ARTERIOVENOUS (AV) FISTULA CREATION;  Surgeon: Young Hensen, MD;  [facility] OR;  Service: Vascular;  Laterality: Left;   AV FISTULA PLACEMENT Left 10/18/2021   Procedure: LEFT ARM BRACHIOBASILIC FISTULA CREATION FIRST STAGE;  Surgeon: Young Hensen, MD;  Location[facility] OR;  Service: Vascular;  Laterality: Left;   AV FISTULA PLACEMENT Right 11/28/2022   Procedure: RIGHT ARM FIRST STAGE BRACHIOBASILIC FISTULA CREATION;  Surgeon: Young Hensen, MD;  [facility];  Service: Vascular;  Laterality: Right;   AV FISTULA PLACEMENT Right 10/19/2023   Procedure: RIGHT ARM ARTERIOVENOUS GRAFT CREATION;  Surgeon: Carlene Che, MD;  [facility];  Service: Vascular;  Laterality: Right;   BACK SURGERY     BASCILIC VEIN TRANSPOSITION Left 01/27/2022   Procedure: LEFT SECOND STAGE BASILIC VEIN TRANSPOSITION;  Surgeon: Young Hensen, MD;  Location: [facility] OR;  Service: Vascular;  Laterality: Left;   BASCILIC VEIN TRANSPOSITION Right 02/18/2023   Procedure: SECOND STAGE RIGHT ARM BASILIC VEIN TRANSPOSITION;  Surgeon: Young Hensen, MD;  Location: [facility] OR;  Service: Vascular;  Laterality: Right;   BIOPSY  08/20/2022   Procedure: BIOPSY;  Surgeon: Cirigliano, Vito V,  DO;  Location: [facility] END[facility]COPY;  Service: Gastroenterology;;   COLONOSCOPY WITH PROPOFOL  N/A 08/20/2022   Procedure: COLONOSCOPY WITH PROPOFOL ;  Surgeon: Annis Kinder, DO;  Location: [facility];  Service: Gastroenterology;  Laterality: N/A;   DIALYSIS/PERMA CATHETER INSERTION Right 03/05/2023   Procedure: DIALYSIS/PERMA CATHETER INSERTION;  Surgeon: Young Hensen, MD;  Location: [facility] INVASIVE CV LAB;  Service: Cardiovascular;  Laterality: Right;   ESOPHAGOGASTRODUODENOSCOPY (EGD) WITH PROPOFOL  N/A 08/20/2022   Procedure: ESOPHAGOGASTRODUODENOSCOPY (EGD) WITH PROPOFOL ;  Surgeon: Annis Kinder, DO;  Location: [facility];  Service: Gastroenterology;   Laterality: N/A;   INSERTION OF DIALYSIS CATHETER Right 05/01/2022   Procedure: INSERTION OF DIALYSIS CATHETER;  Surgeon: Carlene Che, MD;  [facility];  Service: Vascular;  Laterality: Right;   INSERTION OF DIALYSIS CATHETER Right 07/10/2023   Procedure: ULTRASOUNDED GUIDED INSERTION OF TUNNELED  DIALYSIS CATHETER TO RIGHT FEMORAL VEIN;  Surgeon: Young Hensen, MD;  [facility];  Service: Vascular;  Laterality: Right;   IR FLUORO GUIDE CV LINE RIGHT  12/07/2023   IR REMOVAL TUN CV CATH W/O FL  12/19/2022   IR REMOVAL TUN CV CATH W/O FL  11/18/2023   IR US  GUIDE VASC ACCESS RIGHT  12/07/2023   LIGATION OF COMPETING BRANCHES OF ARTERIOVENOUS FISTULA Left 07/15/2021   Procedure: LIGATION OF COMPETING BRANCHES OF LEFT RADIOCEPHALIC ARTERIOVENOUS FISTULA TIMES TWO;  Surgeon: Young Hensen, MD;  [facility];  Service: Vascular;  Laterality: Left;   PERIPHERAL VASCULAR BALLOON ANGIOPLASTY  07/17/2023   Procedure: PERIPHERAL VASCULAR BALLOON ANGIOPLASTY;  Surgeon: Carlene Che, MD;  [facility] INVAS[facility] CV LAB;  Service: Cardiovascular;;   PERIPHERAL VASCULAR INTERVENTION  06/17/2023   Procedure: PERIPHERAL VASCULAR INTERVENTION;  Surgeon: Kayla Part, MD;  Location: [facility] INVASIVE CV LAB;  Service: Cardiovascular;;   POLYPECTOMY  08/20/2022   Procedure: POLYPECTOMY;  Surgeon: Annis Kinder, DO;  Location: [facility];  Service: Gastroenterology;;   REVISON OF ARTERIOVENOUS FISTULA Left 07/15/2021   Procedure: REVISON OF LEFT ARTERIOVENOUS FISTULA;  Surgeon: Young Hensen, MD;  Location: [facility] OR;  Service: Vascular;  Laterality: Left;   REVISON OF ARTERIOVENOUS FISTULA Left 05/01/2022   Procedure: ARTERIOVENOUS FISTULA WASHOUT OF ARM HEMATOMA;  Surgeon: Carlene Che, MD;  Location: [facility] OR;  Service: Vascular;  Laterality: Left;   TUNNELLED CATHETER EXCHANGE N/A 10/23/2023   Procedure: TUNNELLED CATHETER EXCHANGE;  Surgeon: Baron Border, MD;  [facility]  INVASIVE CV LAB;  Service: Cardiovascular;  Laterality: N/A;   UPPER EXTREMITY VENOGRAPHY Bilateral 08/28/2023   Procedure: UPPER EXTREMITY VENOGRAPHY;  Surgeon: Carlene Che, MD;  [facility] INVAS[facility] CV LAB;  Service: Cardiovascular;  Laterality: Bilateral;    Family History  Problem Relation Age of Onset   Heart disease Mother    Heart attack Sister 55    Social History   Socioeconomic History   Marital status: Married    Spouse name: Not on file   Number of children: Not on file   Years of education: Not on file   Highest education level: Not on file  Occupational History   Not on file  Tobacco Use   Smoking status: Every Day    Current packs/day: 0.50    Types: Cigars, Cigarettes    Last attempt to quit: 2019    Passive exposure: Past   Smokeless tobacco: Never   Tobacco comments:    1-2 per day -  black and mild cigars  Vaping Use   Vaping  status: Never Used  Substance and Sexual Activity   Alcohol use: Not Currently    Alcohol/week: 14.0 standard drinks of alcohol    Types: 14 Cans of beer per week    Comment: Reports quit 8 months ago   Drug use: Yes    Frequency: 1.0 times per week    Types: Marijuana, Cocaine     Comment: Cocaine  and cannabis daily for 30 years   Sexual activity: Yes  Other Topics Concern   Not on file  Social History Narrative   Not on file   Social Drivers of Health   Financial Resource Strain: Low Risk  (10/06/2023)   Received from [facility]   Overall Financial Resource Strain (CARDIA)    Difficulty of Paying Living Expenses: Not hard at all  Food Insecurity: No Food Insecurity (12/07/2023)   Hunger Vital Sign    Worried About Running Out of Food in the Last Year: Never true    Ran Out of Food in the Last Year: Never true  Transportation Needs: No Transportation Needs (12/07/2023)   PRAPARE - Administrator, Civil Service (Medical): No    Lack of Transportation (Non-Medical): No  Recent Concern: Transportation  Needs - Unmet Transportation Needs (11/16/2023)   PRAPARE - Administrator, Civil Service (Medical): Yes    Lack of Transportation (Non-Medical): No  Physical Activity: Not on file  Stress: No Stress Concern Present (05/22/2023)   Received from [facility] - Occupational Stress Questionnaire    Feeling of Stress : Not at all  Social Connections: Patient Declined (12/07/2023)   Social Connection and Isolation Panel [NHANES]    Frequency of Communication with Friends and Family: Patient declined    Frequency of Social Gatherings with Friends and Family: Patient declined    Attends Religious Services: Patient declined    Active Member of Clubs or Organizations: Patient declined    Attends Banker Meetings: Patient declined    Marital Status: Patient declined  Intimate Partner Violence: Patient Declined (12/07/2023)   Humiliation, Afraid, Rape, and Kick questionnaire    Fear of Current or Ex-Partner: Patient declined    Emotionally Abused: Patient declined    Physically Abused: Patient declined    Sexually Abused: Patient declined     Physical Exam   Vitals:   12/09/23 0039 12/09/23 0336  BP:    Pulse: 88   Resp:    SpO2:  99%    CONSTITUTIONAL: Well-appearing, NAD NEURO/PSYCH:  Alert and oriented x 3, no focal deficits EYES:  eyes equal and reactive ENT/NECK:  no LAD, no JVD CARDIO: Regular rate, well-perfused, normal S1 and S2 PULM:  CTAB no wheezing or rhonchi GI/GU:  non-distended, non-tender MSK/SPINE:  No gross deformities, no edema SKIN:  no rash, atraumatic   *Additional and/or pertinent findings included in MDM below  Diagnostic and Interventional Summary    EKG Interpretation Date/Time:  December 09, 2023 at 00:22:23 Ventricular Rate:   89 PR Interval:   208 QRS Duration:   95 QT Interval:   399 QTC Calculation:  486 R Axis:      Text Interpretation: Sinus rhythm, nonspecific ST segment  findings       Labs Reviewed  CBC - Abnormal; Notable for the following components:      Result Value   RBC 4.18 (*)    Hemoglobin 11.6 (*)    HCT 36.2 (*)    RDW 16.6 (*)  Platelets 121 (*)    All other components within normal limits  COMPREHENSIVE METABOLIC PANEL WITH GFR - Abnormal; Notable for the following components:   Chloride 93 (*)    BUN 66 (*)    Creatinine, Ser 11.37 (*)    Albumin  3.2 (*)    GFR, Estimated 4 (*)    Anion gap 20 (*)    All other components within normal limits  I-STAT CHEM 8, ED    DG Chest [facility] 1 View  Final Result    IR Radiologist Eval & Mgmt    (Results Pending)    Medications - No data to display   Procedures  /  Critical Care .Critical Care  Performed by: Edson Graces, MD Authorized by: Edson Graces, MD   Critical care provider statement:    Critical care time (minutes):  35   Critical care was necessary to treat or prevent imminent or life-threatening deterioration of the following conditions: Urgent need for dialysis, fluid overloaded state.   Critical care was time spent personally by me on the following activities:  Development of treatment plan with patient or surrogate, discussions with consultants, evaluation of patient's response to treatment, examination of patient, ordering and review of laboratory studies, ordering and review of radiographic studies, ordering and performing treatments and interventions, pulse oximetry, re-evaluation of patient's condition and review of old charts   ED Course and Medical Decision Making  Initial Impression and Ddx Likely fluid overloaded state in the setting of missed dialysis, does not have HD access.  Will need stat EKG and lites to ensure no significant hyperkalemia.  Luckily on room air with normal vitals at this time, no significant increased work of breathing.  Past medical/surgical history that increases complexity of ED encounter: ESRD  Interpretation of Diagnostics I  personally reviewed the EKG and my interpretation is as follows: Sinus rhythm  No significant blood count or electrolyte disturbance.  Patient Reassessment and Ultimate Disposition/Management     Patient does not have hyperkalemia, consulted Dr. Jinx Mourning of interventional radiology, IR will obtain HD access first thing in the morning.  Consulted nephrology for HD at some point today.  Will admit to medicine.  Patient management required discussion with the following services or consulting groups:  Hospitalist Service, Nephrology, and Radiology/Interventional Radiology  Complexity of Problems Addressed Acute illness or injury that poses threat of life of bodily function  Additional Data Reviewed and Analyzed Further history obtained from: Prior labs/imaging results  Additional Factors Impacting ED Encounter Risk Consideration of hospitalization  Merrick Abe. Harless Lien, MD [facility] Emergency Medicine [facility] [email or web address]  Final Clinical Impressions(s) / ED Diagnoses     ICD-10-CM   1. Hypervolemia, unspecified hypervolemia type  E87.70       ED Discharge Orders     None        Discharge Instructions Discussed with and Provided to Patient:   Discharge Instructions   None      Edson Graces, MD 12/09/23 [phone]

## 2023-12-09 NOTE — Progress Notes (Signed)
Hd Start

## 2023-12-09 NOTE — Progress Notes (Signed)
Blood returned  

## 2023-12-09 NOTE — Progress Notes (Signed)
 Report given: Informed RN that patient signed off treatment AMA after 100 minutes despite my efforts to convince him to complete treatment.  Patient is aware that he has a temp-cath in place and will not be able to have dialysis at the outpatient center with that catheter. Catheter will have to be removed prior to being discharged from hospital. He is also having shortness of breath since he has not had dialysis in 5 days. He is aware that this is the safest place to be if he was to have difficulty breathing. Patient is still wanted to be taken off machine. Wife and daughter both attempted to convince him without success. MD notified.

## 2023-12-09 NOTE — ED Notes (Signed)
 Pt refused dialysis until he "finds his glasses." RN checked dirty linen, trash can, and bed. Unable to locate. Pt states "I'm always losing stuff when I come up here." Pt currently calling family and asking to leave the hospital. Pt refuses to leave cardiac monitor leads, BP cuff, oxygen, and pulse oximetry on.

## 2023-12-09 NOTE — Plan of Care (Signed)
  Problem: Clinical Measurements: Goal: Respiratory complications will improve Outcome: Progressing   Problem: Pain Managment: Goal: General experience of comfort will improve and/or be controlled Outcome: Progressing   Problem: Safety: Goal: Ability to remain free from injury will improve Outcome: Progressing   Problem: Skin Integrity: Goal: Risk for impaired skin integrity will decrease Outcome: Progressing

## 2023-12-09 NOTE — ED Notes (Signed)
 Pt confirmed with wife that she has his glasses at home.

## 2023-12-09 NOTE — ED Notes (Signed)
 Pt is sitting on the side of the bed, resting, no complaints at this time.

## 2023-12-09 NOTE — Procedures (Signed)
 Interventional Radiology Procedure Note  Procedure: Temporary hemodialysis catheter exchange  Findings: Please refer to procedural dictation for full description. 20 cm Trialysis via established right internal jugular venotomy.   Complications: None immediate  Estimated Blood Loss: <5 ml  Recommendations: Catheter ready for immediate use. IR available for conversion to graft declot +/- conversion to tunneled HD catheter tomorrow if the patient remains inpatient and is NPO after midnight.   Creasie Doctor, MD

## 2023-12-09 NOTE — ED Notes (Signed)
 PT given a sandwich bag by Harless Lien MD and a ginger ale by this RN

## 2023-12-09 NOTE — H&P (View-Only) (Signed)
 Renal Service Consult Note [facility]  Ronald Reeves 12/09/2023 Lynae Sandifer, MD Requesting Physician: Dr. Eino Gravel   Reason for Consult: ESRD pt w/ clotted AV graft HPI: The patient is a 68 y.o. year-old w/ PMH as below who presented to ED today complaining of shortness of breath.  He missed his dialysis on Saturday due to a clotted graft.  He was here for 2825 and had temporary dialysis catheter placed in the afternoon and while awaiting dialysis in the evening the patient left AMA.  He did not get dialysis.  He went to dialysis the next day which was yesterday and the temporary catheter was clotted.  Patient returns this morning still complaining of shortness of breath.  In the ED today K+ 4.2, BUN 57, creatinine 11.6.  Is on room air at 99% O2 sat.  Chest x-ray showed no edema.  Patient was admitted and we are asked to see for dialysis.   Pt seen in ED room.  He was calmly sleeping when we walked in the room and when we woke him up he he had multiple complaints.  Mainly shortness of breath.  Is alert and oriented   ROS - denies CP, no joint pain, no HA, no blurry vision, no rash, no diarrhea, no nausea/ vomiting  PMH: Anemia Anxiety COPD CAD ESRD on HD HTN Substance abuse  Past Surgical History  Past Surgical History:  Procedure Laterality Date   A/V FISTULAGRAM Right 06/17/2023   Procedure: A/V Fistulagram;  Surgeon: Kayla Part, MD;  Location: [facility] INVASIVE CV LAB;  Service: Cardiovascular;  Laterality: Right;   A/V FISTULAGRAM Right 07/17/2023   Procedure: A/V Fistulagram;  Surgeon: Carlene Che, MD;  Location: [facility] INVASIVE CV LAB;  Service: Cardiovascular;  Laterality: Right;   AV FISTULA PLACEMENT Left 04/12/2021   Procedure: LEFT ARM ARTERIOVENOUS (AV) FISTULA CREATION;  Surgeon: Young Hensen, MD;  Location: [facility] OR;  Service: Vascular;  Laterality: Left;   AV FISTULA PLACEMENT Left 10/18/2021   Procedure: LEFT ARM BRACHIOBASILIC FISTULA CREATION  FIRST STAGE;  Surgeon: Young Hensen, MD;  Location: [facility] OR;  Service: Vascular;  Laterality: Left;   AV FISTULA PLACEMENT Right 11/28/2022   Procedure: RIGHT ARM FIRST STAGE BRACHIOBASILIC FISTULA CREATION;  Surgeon: Young Hensen, MD;  Location: [facility] OR;  Service: Vascular;  Laterality: Right;   AV FISTULA PLACEMENT Right 10/19/2023   Procedure: RIGHT ARM ARTERIOVENOUS GRAFT CREATION;  Surgeon: Carlene Che, MD;  Location: [facility] OR;  Service: Vascular;  Laterality: Right;   BACK SURGERY     BASCILIC VEIN TRANSPOSITION Left 01/27/2022   Procedure: LEFT SECOND STAGE BASILIC VEIN TRANSPOSITION;  Surgeon: Young Hensen, MD;  Location: [facility] OR;  Service: Vascular;  Laterality: Left;   BASCILIC VEIN TRANSPOSITION Right 02/18/2023   Procedure: SECOND STAGE RIGHT ARM BASILIC VEIN TRANSPOSITION;  Surgeon: Young Hensen, MD;  Location: [facility] OR;  Service: Vascular;  Laterality: Right;   BIOPSY  08/20/2022   Procedure: BIOPSY;  Surgeon: Annis Kinder, DO;  Location: [facility];  Service: Gastroenterology;;   COLONOSCOPY WITH PROPOFOL  N/A 08/20/2022   Procedure: COLONOSCOPY WITH PROPOFOL ;  Surgeon: Annis Kinder, DO;  Location: [facility];  Service: Gastroenterology;  Laterality: N/A;   DIALYSIS/PERMA CATHETER INSERTION Right 03/05/2023   Procedure: DIALYSIS/PERMA CATHETER INSERTION;  Surgeon: Young Hensen, MD;  Location: [facility] INVASIVE CV LAB;  Service: Cardiovascular;  Laterality: Right;   ESOPHAGOGASTRODUODENOSCOPY (EGD) WITH PROPOFOL  N/A 08/20/2022   Procedure: ESOPHAGOGASTRODUODENOSCOPY (  EGD) WITH PROPOFOL ;  Surgeon: Annis Kinder, DO;  Location: [facility];  Service: Gastroenterology;  Laterality: N/A;   INSERTION OF DIALYSIS CATHETER Right 05/01/2022   Procedure: INSERTION OF DIALYSIS CATHETER;  Surgeon: Carlene Che, MD;  Location: [facility] OR;  Service: Vascular;  Laterality: Right;   INSERTION OF DIALYSIS CATHETER Right 07/10/2023   Procedure: ULTRASOUNDED  GUIDED INSERTION OF TUNNELED  DIALYSIS CATHETER TO RIGHT FEMORAL VEIN;  Surgeon: Young Hensen, MD;  Location: [facility] OR;  Service: Vascular;  Laterality: Right;   IR FLUORO GUIDE CV LINE RIGHT  12/07/2023   IR REMOVAL TUN CV CATH W/O FL  12/19/2022   IR REMOVAL TUN CV CATH W/O FL  11/18/2023   IR US  GUIDE VASC ACCESS RIGHT  12/07/2023   LIGATION OF COMPETING BRANCHES OF ARTERIOVENOUS FISTULA Left 07/15/2021   Procedure: LIGATION OF COMPETING BRANCHES OF LEFT RADIOCEPHALIC ARTERIOVENOUS FISTULA TIMES TWO;  Surgeon: Young Hensen, MD;  Location: [facility] OR;  Service: Vascular;  Laterality: Left;   PERIPHERAL VASCULAR BALLOON ANGIOPLASTY  07/17/2023   Procedure: PERIPHERAL VASCULAR BALLOON ANGIOPLASTY;  Surgeon: Carlene Che, MD;  Location: [facility] INVASIVE CV LAB;  Service: Cardiovascular;;   PERIPHERAL VASCULAR INTERVENTION  06/17/2023   Procedure: PERIPHERAL VASCULAR INTERVENTION;  Surgeon: Kayla Part, MD;  Location: [facility] INVASIVE CV LAB;  Service: Cardiovascular;;   POLYPECTOMY  08/20/2022   Procedure: POLYPECTOMY;  Surgeon: Annis Kinder, DO;  Location: [facility];  Service: Gastroenterology;;   REVISON OF ARTERIOVENOUS FISTULA Left 07/15/2021   Procedure: REVISON OF LEFT ARTERIOVENOUS FISTULA;  Surgeon: Young Hensen, MD;  Location: [facility] OR;  Service: Vascular;  Laterality: Left;   REVISON OF ARTERIOVENOUS FISTULA Left 05/01/2022   Procedure: ARTERIOVENOUS FISTULA WASHOUT OF ARM HEMATOMA;  Surgeon: Carlene Che, MD;  Location: [facility] OR;  Service: Vascular;  Laterality: Left;   TUNNELLED CATHETER EXCHANGE N/A 10/23/2023   Procedure: TUNNELLED CATHETER EXCHANGE;  Surgeon: Baron Border, MD;  Location: [facility] INVASIVE CV LAB;  Service: Cardiovascular;  Laterality: N/A;   UPPER EXTREMITY VENOGRAPHY Bilateral 08/28/2023   Procedure: UPPER EXTREMITY VENOGRAPHY;  Surgeon: Carlene Che, MD;  Location: [facility] INVASIVE CV LAB;  Service: Cardiovascular;  Laterality: Bilateral;   Family History   Family History  Problem Relation Age of Onset   Heart disease Mother    Heart attack Sister 45   Social History  reports that he has been smoking cigars and cigarettes. He has been exposed to tobacco smoke. He has never used smokeless tobacco. He reports that he does not currently use alcohol after a past usage of about 14.0 standard drinks of alcohol per week. He reports current drug use. Frequency: 1.00 time per week. Drugs: Marijuana and Cocaine . Allergies  Allergies  Allergen Reactions   Enalapril  Other (See Comments)    Renal failure syndrome   Gabapentin      Encephalopathy, tremor   Iodinated Contrast Media Nausea And Vomiting   Home medications Prior to Admission medications   Medication Sig Start Date End Date Taking? Authorizing Provider  albuterol  (VENTOLIN  HFA) 108 (90 Base) MCG/ACT inhaler Inhale 2 puffs into the lungs every 6 (six) hours as needed (COPD).    [provider]  amLODipine  (NORVASC ) 10 MG tablet Take 1 tablet (10 mg total) by mouth daily. Patient taking differently: Take 10 mg by mouth daily. Hold for systolic BP <100 1/61/09   Lenise Quince, MD  apixaban  (ELIQUIS ) 5 MG TABS tablet Take 1 tablet (5 mg total)  by mouth 2 (two) times daily. 03/06/23   Atway, Rayann N, DO  ARIPiprazole  (ABILIFY ) 5 MG tablet Take 1 tablet (5 mg total) by mouth daily. 11/20/23   Ghimire, Estil Heman, MD  B Complex-C-Folic Acid  (DIALYVITE TABLET) TABS Take 1 tablet by mouth daily. 12/23/22   [provider]  carvedilol  (COREG ) 12.5 MG tablet Take 12.5 mg by mouth 2 (two) times daily with a meal.    [provider]  escitalopram  (LEXAPRO ) 20 MG tablet Take 0.5 tablets (10 mg total) by mouth at bedtime. 11/19/23   Ghimire, Estil Heman, MD  ferric citrate (AURYXIA) 1 GM 210 MG(Fe) tablet Take 210 mg by mouth 3 (three) times daily with meals.    [provider]  furosemide  (LASIX ) 40 MG tablet Take 40 mg by mouth daily.    [provider]   hydrALAZINE  (APRESOLINE ) 25 MG tablet Take 25 mg by mouth 2 (two) times daily.    [provider]  isosorbide  mononitrate (IMDUR ) 30 MG 24 hr tablet Take 30 mg by mouth daily.    [provider]  lidocaine  (LIDODERM ) 5 % Place 2 patches onto the skin daily. Remove & Discard patch within 12 hours or as directed by MD    [provider]  naloxone  (NARCAN ) nasal spray 4 mg/0.1 mL Place 0.4 mg into the nose See admin instructions.  SPRAY 1 SPRAY INTO ONE NOSTRIL AS DIRECTED AS NEEDED FOR RESCUE FOR OPIOID OVERDOSE - CALL 911 IMMEDIATELY, ADMINISTER DOSE, THEN TURN PERSON ON SIDE - IF NO RESPONSE IN 2-3 MINUTES OR PERSON RESPONDS BUT RELAPSES, REPEAT USING A NEW SPRAY DEVICE AND SPRAY INTO THE OTHER NOSTRIL AS NEEDED FOR RESCUE 01/21/23   [provider]  omeprazole  (PRILOSEC) 40 MG capsule Take 1 capsule (40 mg total) by mouth daily. Patient taking differently: Take 40 mg by mouth daily as needed. 06/06/22   Kennedy-Smith, Colleen M, NP  ondansetron  (ZOFRAN -ODT) 8 MG disintegrating tablet Take 8 mg by mouth every 6 (six) hours. 11/06/23   [provider]  oxyCODONE -acetaminophen  (PERCOCET/ROXICET) 5-325 MG tablet Take 1 tablet by mouth every 4 (four) hours as needed for severe pain (pain score 7-10). 10/20/23   Carlene Che, MD  PSYLLIUM PO Take by mouth. 09/03/23   [provider]  rosuvastatin  (CRESTOR ) 10 MG tablet Take 10 mg by mouth daily.    [provider]  sevelamer  carbonate (RENVELA ) 800 MG tablet Take 1,600 mg by mouth 3 (three) times daily with meals. 01/06/23   [provider]  sorbitol 70 % solution Take 30 mLs by mouth 3 (three) times daily as needed (constipation). 01/20/23   [provider]  tamsulosin  (FLOMAX ) 0.4 MG CAPS capsule Take 0.4 mg by mouth at bedtime.    [provider]  Tiotropium Bromide-Olodaterol 2.5-2.5 MCG/ACT AERS Take 2 puffs by mouth in the morning. 08/07/23   [provider]     Vitals:   12/09/23 1610 12/09/23 1004 12/09/23 1103 12/09/23 1145  BP:   (!) 140/103 (!) 141/96  Pulse:   92   Resp:  (!) 30 (!) 41 (!) 22  Temp: 98.3 F (36.8 C)     TempSrc: Oral     SpO2:  (!) 82% 100%   Weight:      Height:       Exam Gen alert, no distress, on RA, 99% spO2 No rash, cyanosis or gangrene Sclera anicteric, throat clear  No jvd or bruits Chest clear bilat to  bases, no rales/ wheezing RRR no MRG Abd soft ntnd no mass or ascites +bs GU nl male MS no joint effusions or deformity Ext 1+ bilat LE edema, no other edema Neuro is alert, Ox 3 , nf    RUA AVG no bruit      Renal-related home meds: Norvasc  10 mg daily Coreg  12.5 twice daily Auryxia 1 AC 3 times daily Lasix  40 mg daily Hydralazine  25 twice daily Renvela  2 AC 3 times daily    OP HD: TTS NW 4h   B400   RUE AVG  Heparin  2500    Assessment/ Plan: Clotted AV graft: temp cath placed 4/28 by IR, pt left AMA w/ catheter in and went to HD next day but cath was clotted. IR consulting, appreciate assistance, planning on replacing temp cath given his symptoms and postpone any declot AVG procedures for now.  ESRD: on HD TTS. Last HD was last Thursday. HD later after access re-established.  HTN: BP's are high, cont home meds. Lower vol w/ HD.  Volume: mild-mod vol excess, up 4-5 kg per pt. On RA. Max UF w/ next HD.  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   Secondary hyperparathyroidism: CCa in range, cont binders ac    Rob Zana Hesselbach  MD [facility] 12/09/2023, 12:23 PM  Recent Labs  Lab 12/07/23 0647 12/07/23 0652 12/09/23 0214 12/09/23 0217  HGB 13.5   < > 11.6* 13.3  ALBUMIN   --   --  3.2*  --   CALCIUM  9.3  --  9.2  --   CREATININE 9.93*   < > 11.37* 11.60*  K 5.5*   < > 4.2 4.2   < > = values in this interval not displayed.   Inpatient medications:  nicotine  21 mg Transdermal Daily   sodium chloride  flush  3 mL Intravenous Q12H    acetaminophen  **OR** acetaminophen , albuterol ,  hydrALAZINE , ondansetron  **OR** ondansetron  (ZOFRAN ) IV, oxyCODONE -acetaminophen 

## 2023-12-09 NOTE — ED Notes (Signed)
 Called and placed PT on monitor with CCMD.

## 2023-12-09 NOTE — ED Notes (Signed)
 Pt refuses to stay in his room or have his VS monitored at this time

## 2023-12-09 NOTE — Progress Notes (Signed)
 Pt arrived to 5w09 from the ED.

## 2023-12-09 NOTE — ED Notes (Signed)
 PT asked if he could eat,when I told him he had to ask the physician , he stated, he didn't care because his wife would bring him something anyway.

## 2023-12-09 NOTE — Progress Notes (Addendum)
 Patient came back from HD irritable and demanding to leave AMA. This RN attempted to reason with patient that his temporary HD catheter is supposed to be changed out tomorrow to a permanent catheter. Patient stated that the MD said it was okay for him to leave the hospital with the temporary catheter and go to his regular outside HD center tomorrow to have HD tx. Patient refused VS upon arrival to floor. Patient was then coming out to main nursing station yelling at staff "I am leaving, how do I get out of here!" This RN was able to remove PIV prior to him leaving the unit. Patient then circled the unit and patient went down the back steps. Security was called. Nursing staff did follow patient down the steps where he eventually was not able to figure out which door led him out of the hospital. Patient threatened staff that he was going "throw ya'll down the steps if yall dont let me out of here!" Patient the proceeded to sit down on the steps towards the bottom and nursing staff stood at the top, waiting for security to arrive at the scene. Security came after about 10 minutes and escorted patient out of the stairwell onto the second floor.   We the nursing staff came back to [address] [place]. Dicussed event with on call provider Dr. Ishmael Marcus MD. It was determined that the trialysis (temporary catheter) was okay to be left in place as it is his life line. AMA paperwork was signed by charge RN and patient, prior to patient fleeing down back steps.

## 2023-12-09 NOTE — Consult Note (Signed)
 Patient Status: [facility] - In-pt  Assessment and Plan: Malfunctioning dialysis catheter   Patient assessed in ED.  His graft appears clotted with no discernable bruit or thrill.  This is consistent with prior exam during most recent admission when graft was assessed to be clotted.  Unfortunately, plans made for temp cath placement for urgent dialysis with declot the following day, however patient left AMA after temp cath placed.   Discussed with Dr. Jinx Mourning who recommends VAS US  duplex of the clotted graft to determine whether it is amenable to intervention.   Will proceed with temp catheter exchange today in hopes he will receive urgent dialysis today and opt to stay for more long-term access tomorrow once studies are back (tunneled catheter vs. Declot).   This WAS DISCUSSED with the patient at bedside who states "I'll do what I have to do." He is understanding of the plan to proceed with temp cath today and is agreeable.   Risks and benefits discussed with the patient including, but not limited to bleeding, infection, vascular injury, pneumothorax which may require chest tube placement, air embolism or even death  All of the patient's questions were answered, patient is agreeable to proceed. Consent signed and in chart. ______________________________________________________________________   History of Present Illness: Ronald Reeves is a 68 y.o. male s/p temporary HD catheter placement 12/07/23.  He subsequently left AMA with catheter in place.  Returns to ED today with inability to dilayze due to catheter malfunction at his center today.  IR consulted for line replacement vs. Exchange.   Allergies and medications reviewed.   Review of Systems: A 12 point ROS discussed and pertinent positives are indicated in the HPI above.  All other systems are negative.  Review of Systems  Constitutional:  Negative for fatigue and fever.  Respiratory:  Positive for shortness of breath. Negative for  cough.   Gastrointestinal:  Negative for abdominal pain, nausea and vomiting.  Musculoskeletal:  Negative for back pain.  Psychiatric/Behavioral:  Negative for behavioral problems and confusion.     Vital Signs: BP (!) 141/96   Pulse 92   Temp 98.3 F (36.8 C) (Oral)   Resp (!) 22   Ht 5\' 10"  (1.778 m)   Wt 147 lb 7.8 oz (66.9 kg)   SpO2 100%   BMI 21.16 kg/m   Physical Exam Vitals and nursing note reviewed.  Constitutional:      General: He is not in acute distress.    Appearance: He is well-developed. He is not ill-appearing.  Cardiovascular:     Rate and Rhythm: Normal rate.  Pulmonary:     Effort: Tachypnea present. Respiratory distress: shortness of breath, worse with ambulation, on O2. Skin:    General: Skin is warm and dry.     Comments: Volume overloaded  Neurological:     General: No focal deficit present.     Mental Status: He is alert and oriented to person, place, and time.  Psychiatric:        Mood and Affect: Mood normal.        Behavior: Behavior normal.      Imaging reviewed.   Labs:  COAGS: Recent Labs    12/16/22 2337 03/13/23 2053 03/18/23 2255  INR 1.1 1.2 1.3*  APTT  --  32 33    BMP: Recent Labs    11/17/23 0428 11/18/23 0420 12/07/23 0647 12/07/23 0652 12/07/23 1538 12/09/23 0214 12/09/23 0217  NA 134* 137 140 138  138  --  138  135  K 3.8 4.1 5.5* 5.7*  5.7*  --  4.2 4.2  CL 97* 98 95* 99  --  93* 97*  CO2 22 24 25   --   --  25  --   GLUCOSE 122* 131* 106* 104*  --  92 88  BUN 45* 51* 48* 63*  --  66* 57*  CALCIUM  9.0 8.8* 9.3  --   --  9.2  --   CREATININE 8.45* 9.46* 9.93* 10.50* 10.39* 11.37* 11.60*  GFRNONAA 6* 6* 5*  --  5* 4*  --        Electronically Signed: Lillian Rein, PA 12/09/2023, 12:23 PM   I spent a total of 15 minutes in face to face in clinical consultation, greater than 50% of which was counseling/coordinating care for venous access.

## 2023-12-09 NOTE — Progress Notes (Signed)
 Patient's daughter called to convince him to stay until 21:30. Patient got upset and hung up on her. Requesting to be taken off machine. Treatment terminated

## 2023-12-09 NOTE — H&P (Signed)
 History and Physical    Patient: Ronald Reeves EAV:[id number] DOB: [birth year] DOA: 12/09/2023 DOS: the patient was seen and examined on 12/09/2023 PCP: Doak Free, MD  Patient coming from: Home  Chief Complaint:  Chief Complaint  Patient presents with   Shortness of Breath   HPI: Ronald Reeves is a 68 y.o. male with medical history significant of ESRD on HD, hypertension, paroxysmal atrial fibrillation on Eliquis , HFrEF, GERD, and polysubstance abuse presents with complaints of shortness of breath.  Records note he was recently hospitalized on 4/28 due to his fistula clotting off and him being unable to dialyze on 4/26.  He had a temporary right IJ hemodialysis catheter placed by interventional radiology with plans for image guided right arteriovenous fistula declot the following day on 4/29.  However, patient left AGAINST MEDICAL ADVICE after the temporary dialysis catheter procedure had been placed while he was waiting dialysis.  He had gone to dialysis yesterday, but notes that the catheter was not working.  He has been experiencing difficulty breathing and lower extremity swelling.  The fistula in his right arm has been there for at least 3 weeks. He smokes black and brown cigarettes, about one or two a day, and uses cocaine , with his last use being the night before before coming to the hospital.  He is currently taking Eliquis , a blood thinner, and took his last dose the night before the visit.  In the emergency department patient was noted to be tachypneic with blood pressures elevated up to 149/83, and O2 saturation maintained on room air.  Labs noted potassium 4.2, chloride 93, CO2 25, BUN 66, creatinine 11.37, glucose 92, and anion gap 20.  Chest x-ray revealed cardiomegaly with no frank interstitial edema and right IJ catheter in place.  He was given Zofran  and 50 mcg of fentanyl  IV.  Review of Systems: As mentioned in the history of present illness. All other systems reviewed  and are negative. Past Medical History:  Diagnosis Date   Anemia    Anxiety    Arthritis    Back pain    Bronchitis    COPD (chronic obstructive pulmonary disease) (HCC)    Coronary artery disease    COVID    mild - flu like symptoms   Dyspnea    w/ exertion, uses inhaler   ESRD on hemodialysis (HCC)    dialysis on tues, thurs, sat at [facility]   GERD (gastroesophageal reflux disease)    not a current problem   Heart murmur    never has caused any problems   Hypertension    Myocardial infarction (HCC)    Pneumonia    x 1   Pre-diabetes    diet controlled, no meds, does not check blood sugar   Substance abuse [facility])    Past Surgical History:  Procedure Laterality Date   A/V FISTULAGRAM Right 06/17/2023   Procedure: A/V Fistulagram;  Surgeon: Kayla Part, MD;  Location[facility] INVASIVE CV LAB;  Service: Cardiovascular;  Laterality: Right;   A/V FISTULAGRAM Right 07/17/2023   Procedure: A/V Fistulagram;  Surgeon: Carlene Che, MD;  Location: [facility] INVASIVE CV LAB;  Service: Cardiovascular;  Laterality: Right;   AV FISTULA PLACEMENT Left 04/12/2021   Procedure: LEFT ARM ARTERIOVENOUS (AV) FISTULA CREATION;  Surgeon: Young Hensen, MD;  Location: [facility] OR;  Service: Vascular;  Laterality: Left;   AV FISTULA PLACEMENT Left 10/18/2021   Procedure: LEFT ARM BRACHIOBASILIC FISTULA CREATION FIRST STAGE;  Surgeon: Young Hensen, MD;  Location:  [facility] OR;  Service: Vascular;  Laterality: Left;   AV FISTULA PLACEMENT Right 11/28/2022   Procedure: RIGHT ARM FIRST STAGE BRACHIOBASILIC FISTULA CREATION;  Surgeon: Young Hensen, MD;  Location: [facility] OR;  Service: Vascular;  Laterality: Right;   AV FISTULA PLACEMENT Right 10/19/2023   Procedure: RIGHT ARM ARTERIOVENOUS GRAFT CREATION;  Surgeon: Carlene Che, MD;  Location: [facility] OR;  Service: Vascular;  Laterality: Right;   BACK SURGERY     BASCILIC VEIN TRANSPOSITION Left 01/27/2022   Procedure: LEFT SECOND STAGE BASILIC VEIN TRANSPOSITION;   Surgeon: Young Hensen, MD;  Location: [facility] OR;  Service: Vascular;  Laterality: Left;   BASCILIC VEIN TRANSPOSITION Right 02/18/2023   Procedure: SECOND STAGE RIGHT ARM BASILIC VEIN TRANSPOSITION;  Surgeon: Young Hensen, MD;  Location: [facility] OR;  Service: Vascular;  Laterality: Right;   BIOPSY  08/20/2022   Procedure: BIOPSY;  Surgeon: Annis Kinder, DO;  Location: [facility] ENDOSCOPY;  Service: Gastroenterology;;   COLONOSCOPY WITH PROPOFOL  N/A 08/20/2022   Procedure: COLONOSCOPY WITH PROPOFOL ;  Surgeon: Annis Kinder, DO;  Location: [facility]OSCOPY;  Service: Gastroenterology;  Laterality: N/A;   DIALYSIS/PERMA CATHETER INSERTION Right 03/05/2023   Procedure: DIALYSIS/PERMA CATHETER INSERTION;  Surgeon: Young Hensen, MD;  Location: [facility] INVASIVE CV LAB;  Service: Cardiovascular;  Laterality: Right;   ESOPHAGOGASTRODUODENOSCOPY (EGD) WITH PROPOFOL  N/A 08/20/2022   Procedure: ESOPHAGOGASTRODUODENOSCOPY (EGD) WITH PROPOFOL ;  Surgeon: Annis Kinder, DO;  Location: [facility];  Service: Gastroenterology;  Laterality: N/A;   INSERTION OF DIALYSIS CATHETER Right 05/01/2022   Procedure: INSERTION OF DIALYSIS CATHETER;  Surgeon: Carlene Che, MD;  Location: [facility] OR;  Service: Vascular;  Laterality: Right;   INSERTION OF DIALYSIS CATHETER Right 07/10/2023   Procedure: ULTRASOUNDED GUIDED INSERTION OF TUNNELED  DIALYSIS CATHETER TO RIGHT FEMORAL VEIN;  Surgeon: Young Hensen, MD;  Location: [facility] OR;  Service: Vascular;  Laterality: Right;   IR FLUORO GUIDE CV LINE RIGHT  12/07/2023   IR REMOVAL TUN CV CATH W/O FL  12/19/2022   IR REMOVAL TUN CV CATH W/O FL  11/18/2023   IR US  GUIDE VASC ACCESS RIGHT  12/07/2023   LIGATION OF COMPETING BRANCHES OF ARTERIOVENOUS FISTULA Left 07/15/2021   Procedure: LIGATION OF COMPETING BRANCHES OF LEFT RADIOCEPHALIC ARTERIOVENOUS FISTULA TIMES TWO;  Surgeon: Young Hensen, MD;  Location: [facility] OR;  Service: Vascular;  Laterality: Left;   PERIPHERAL  VASCULAR BALLOON ANGIOPLASTY  07/17/2023   Procedure: PERIPHERAL VASCULAR BALLOON ANGIOPLASTY;  Surgeon: Carlene Che, MD;  Location: [facility] INVASIVE CV LAB;  Service: Cardiovascular;;   PERIPHERAL VASCULAR INTERVENTION  06/17/2023   Procedure: PERIPHERAL VASCULAR INTERVENTION;  Surgeon: Kayla Part, MD;  Location: [facility] INVASIVE CV LAB;  Service: Cardiovascular;;   POLYPECTOMY  08/20/2022   Procedure: POLYPECTOMY;  Surgeon: Annis Kinder, DO;  Location: [facility];  Service: Gastroenterology;;   REVISON OF ARTERIOVENOUS FISTULA Left 07/15/2021   Procedure: REVISON OF LEFT ARTERIOVENOUS FISTULA;  Surgeon: Young Hensen, MD;  Location: [facility] OR;  Service: Vascular;  Laterality: Left;   REVISON OF ARTERIOVENOUS FISTULA Left 05/01/2022   Procedure: ARTERIOVENOUS FISTULA WASHOUT OF ARM HEMATOMA;  Surgeon: Carlene Che, MD;  Location: [facility] OR;  Service: Vascular;  Laterality: Left;   TUNNELLED CATHETER EXCHANGE N/A 10/23/2023   Procedure: TUNNELLED CATHETER EXCHANGE;  Surgeon: Baron Border, MD;  Location: [facility] INVASIVE CV LAB;  Service: Cardiovascular;  Laterality: N/A;   UPPER EXTREMITY VENOGRAPHY Bilateral 08/28/2023   Procedure: UPPER EXTREMITY VENOGRAPHY;  Surgeon: Carlene Che, MD;  Location: [facility] INVASIVE CV LAB;  Service: Cardiovascular;  Laterality: Bilateral;   Social History:  reports that he has been smoking cigars and cigarettes. He has been exposed to tobacco smoke. He has never used smokeless tobacco. He reports that he does not currently use alcohol after a past usage of about 14.0 standard drinks of alcohol per week. He reports current drug use. Frequency: 1.00 time per week. Drugs: Marijuana and Cocaine .  Allergies  Allergen Reactions   Enalapril  Other (See Comments)    Renal failure syndrome   Gabapentin      Encephalopathy, tremor   Iodinated Contrast Media Nausea And Vomiting    Family History  Problem Relation Age of Onset   Heart disease Mother    Heart  attack Sister 1    Prior to Admission medications   Medication Sig Start Date End Date Taking? Authorizing Provider  albuterol  (VENTOLIN  HFA) 108 (90 Base) MCG/ACT inhaler Inhale 2 puffs into the lungs every 6 (six) hours as needed (COPD).    [provider]  amLODipine  (NORVASC ) 10 MG tablet Take 1 tablet (10 mg total) by mouth daily. Patient taking differently: Take 10 mg by mouth daily. Hold for systolic BP <100 1/61/09   Lenise Quince, MD  apixaban  (ELIQUIS ) 5 MG TABS tablet Take 1 tablet (5 mg total) by mouth 2 (two) times daily. 03/06/23   Atway, Rayann N, DO  ARIPiprazole  (ABILIFY ) 5 MG tablet Take 1 tablet (5 mg total) by mouth daily. 11/20/23   Ghimire, Estil Heman, MD  B Complex-C-Folic Acid  (DIALYVITE TABLET) TABS Take 1 tablet by mouth daily. 12/23/22   [provider]  carvedilol  (COREG ) 12.5 MG tablet Take 12.5 mg by mouth 2 (two) times daily with a meal.    [provider]  escitalopram  (LEXAPRO ) 20 MG tablet Take 0.5 tablets (10 mg total) by mouth at bedtime. 11/19/23   Ghimire, Estil Heman, MD  ferric citrate (AURYXIA) 1 GM 210 MG(Fe) tablet Take 210 mg by mouth 3 (three) times daily with meals.    [provider]  furosemide  (LASIX ) 40 MG tablet Take 40 mg by mouth daily.    [provider]  hydrALAZINE  (APRESOLINE ) 25 MG tablet Take 25 mg by mouth 2 (two) times daily.    [provider]  isosorbide  mononitrate (IMDUR ) 30 MG 24 hr tablet Take 30 mg by mouth daily.    [provider]  lidocaine  (LIDODERM ) 5 % Place 2 patches onto the skin daily. Remove & Discard patch within 12 hours or as directed by MD    [provider]  naloxone  (NARCAN ) nasal spray 4 mg/0.1 mL Place 0.4 mg into the nose See admin instructions.  SPRAY 1 SPRAY INTO ONE NOSTRIL AS DIRECTED AS NEEDED FOR RESCUE FOR OPIOID OVERDOSE - CALL 911 IMMEDIATELY, ADMINISTER DOSE, THEN TURN PERSON ON SIDE - IF NO RESPONSE IN 2-3 MINUTES OR PERSON RESPONDS  BUT RELAPSES, REPEAT USING A NEW SPRAY DEVICE AND SPRAY INTO THE OTHER NOSTRIL AS NEEDED FOR RESCUE 01/21/23   [provider]  omeprazole  (PRILOSEC) 40 MG capsule Take 1 capsule (40 mg total) by mouth daily. Patient taking differently: Take 40 mg by mouth daily as needed. 06/06/22   Kennedy-Elisea Khader, Colleen M, NP  ondansetron  (ZOFRAN -ODT) 8 MG disintegrating tablet Take 8 mg by mouth every 6 (six) hours. 11/06/23   [provider]  oxyCODONE -acetaminophen  (PERCOCET/ROXICET) 5-325 MG tablet Take 1 tablet by mouth every 4 (four) hours as  needed for severe pain (pain score 7-10). 10/20/23   Carlene Che, MD  PSYLLIUM PO Take by mouth. 09/03/23   [provider]  rosuvastatin  (CRESTOR ) 10 MG tablet Take 10 mg by mouth daily.    [provider]  sevelamer  carbonate (RENVELA ) 800 MG tablet Take 1,600 mg by mouth 3 (three) times daily with meals. 01/06/23   [provider]  sorbitol 70 % solution Take 30 mLs by mouth 3 (three) times daily as needed (constipation). 01/20/23   [provider]  tamsulosin  (FLOMAX ) 0.4 MG CAPS capsule Take 0.4 mg by mouth at bedtime.    [provider]  Tiotropium Bromide-Olodaterol 2.5-2.5 MCG/ACT AERS Take 2 puffs by mouth in the morning. 08/07/23   [provider]    Physical Exam: Vitals:   12/09/23 5366 12/09/23 0115 12/09/23 0336 12/09/23 0525  BP:  (!) 140/90  (!) 141/98  Pulse: 88     Resp:  (!) 26  19  Temp:    98.1 F (36.7 C)  SpO2:   99% 99%  Weight:      Height:        Constitutional: Elderly male who is lethargic but in no acute distress Eyes: PERRL, lids and conjunctivae normal ENMT: Mucous membranes are moist. Normal dentition.  Neck: normal, supple  Respiratory: Tachypneic with coarse breath sounds.  Able to speak easily in complete sentences. Cardiovascular: Regular rate and rhythm, no murmurs / rubs / gallops.  +2 pitting edema.n Abdomen: no tenderness, no masses palpated.   Bowel sounds positive.  Musculoskeletal: no clubbing / cyanosis. No joint deformity upper and lower extremities. Good ROM, no contractures. Normal muscle tone.  Skin: no rashes, lesions, ulcers.   Neurologic: CN 2-12 grossly intact.  Strength 5/5 in all 4.  Psychiatric: Lethargic, but appears oriented x 3.  Data Reviewed:  EKG revealed a sinus rhythm at 89 bpm with first-degree heart block with biatrial enlargement and left anterior fascicular block.  Assessment and Plan:  Shortness of breath secondary to hypervolemia ESRD on HD Clotted AV graft Elevated anion gap Patient presented with complaints of shortness of breath.  Recently hospitalized on 4/28 for right AV fistula malfunctioning and had temporary dialysis catheter placed.  Went to dialysis yesterday and reported catheter not functioning properly due to being clotted.  O2 saturations currently maintained on labs noted potassium 4.2, CO2 25, BUN 66, creatinine 11.37, and anion gap 20.  Chest x-ray noted cardiomegaly without frank edema and right IJ lumen catheter terminating at the cavoatrial junction.  IR consulted for need of dialysis. - Admit to medical telemetry bed - Renal diet with fluid restriction - Follow-up Doppler ultrasound right upper extremity - Resume prior to arrival meds - IR consulted for need of access and plan on exchanging out temporary dialysis catheter with plans on possible declot tomorrow if patient stays - Nephrology consulted for need of dialysis.  Paroxysmal atrial fibrillation on chronic anticoagulation Reports last dose of Eliquis  yesterday evening.  Patient currently in a sinus rhythm. - Held Eliquis  for now.  Resume once procedures completed  Hypertension Initial blood pressures elevated up to 149/83.   - Resume prior home meds  Pancytopenia Hemoglobin 11.6 and platelet count 121 which appears around patient's baseline. - Continue to monitor  Hyperlipidemia - Continue Crestor   Polysubstance  abuse Patient admits to recently using cocaine  yesterday prior to arrival to the hospital.  He also reports smoking Black and milds cigarettes on a regular basis. - Continue to  counsel on need of cessation of illicit drug use - Nicotine patch offered - Transitions of care consulted for drug abuse  DVT prophylaxis: Heparin  Advance Care Planning: Full  Consults: Nephrology and interventional radiology  Family Communication:  Wife updated over the patient   Severity of Illness: The appropriate patient status for this patient is OBSERVATION. Observation status is judged to be reasonable and necessary in order to provide the required intensity of service to ensure the patient's safety. The patient's presenting symptoms, physical exam findings, and initial radiographic and laboratory data in the context of their medical condition is felt to place them at decreased risk for further clinical deterioration. Furthermore, it is anticipated that the patient will be medically stable for discharge from the hospital within 2 midnights of admission.   Author: Lena Qualia, MD 12/09/2023 7:56 AM  For on call review [email or web address].

## 2023-12-09 NOTE — ED Notes (Signed)
 Pt up and walking around room, said he is exercising, went to bathroom and vomited.

## 2023-12-09 NOTE — ED Notes (Signed)
 Pt went to bathroom again and vomited, md asked for medication.

## 2023-12-09 NOTE — Consult Note (Signed)
 Renal Service Consult Note [facility]  Ronald Reeves 12/09/2023 Lynae Sandifer, MD Requesting Physician: Dr. Eino Gravel   Reason for Consult: ESRD pt w/ clotted AV graft HPI: The patient is a 68 y.o. year-old w/ PMH as below who presented to ED today complaining of shortness of breath.  He missed his dialysis on Saturday due to a clotted graft.  He was here for 2825 and had temporary dialysis catheter placed in the afternoon and while awaiting dialysis in the evening the patient left AMA.  He did not get dialysis.  He went to dialysis the next day which was yesterday and the temporary catheter was clotted.  Patient returns this morning still complaining of shortness of breath.  In the ED today K+ 4.2, BUN 57, creatinine 11.6.  Is on room air at 99% O2 sat.  Chest x-ray showed no edema.  Patient was admitted and we are asked to see for dialysis.   Pt seen in ED room.  He was calmly sleeping when we walked in the room and when we woke him up he he had multiple complaints.  Mainly shortness of breath.  Is alert and oriented   ROS - denies CP, no joint pain, no HA, no blurry vision, no rash, no diarrhea, no nausea/ vomiting  PMH: Anemia Anxiety COPD CAD ESRD on HD HTN Substance abuse  Past Surgical History  Past Surgical History:  Procedure Laterality Date   A/V FISTULAGRAM Right 06/17/2023   Procedure: A/V Fistulagram;  Surgeon: Kayla Part, MD;  Location: [facility] INVASIVE CV LAB;  Service: Cardiovascular;  Laterality: Right;   A/V FISTULAGRAM Right 07/17/2023   Procedure: A/V Fistulagram;  Surgeon: Carlene Che, MD;  Location: [facility] INVASIVE CV LAB;  Service: Cardiovascular;  Laterality: Right;   AV FISTULA PLACEMENT Left 04/12/2021   Procedure: LEFT ARM ARTERIOVENOUS (AV) FISTULA CREATION;  Surgeon: Young Hensen, MD;  Location: [facility] OR;  Service: Vascular;  Laterality: Left;   AV FISTULA PLACEMENT Left 10/18/2021   Procedure: LEFT ARM BRACHIOBASILIC FISTULA CREATION  FIRST STAGE;  Surgeon: Young Hensen, MD;  Location: [facility] OR;  Service: Vascular;  Laterality: Left;   AV FISTULA PLACEMENT Right 11/28/2022   Procedure: RIGHT ARM FIRST STAGE BRACHIOBASILIC FISTULA CREATION;  Surgeon: Young Hensen, MD;  Location: [facility] OR;  Service: Vascular;  Laterality: Right;   AV FISTULA PLACEMENT Right 10/19/2023   Procedure: RIGHT ARM ARTERIOVENOUS GRAFT CREATION;  Surgeon: Carlene Che, MD;  Location: [facility] OR;  Service: Vascular;  Laterality: Right;   BACK SURGERY     BASCILIC VEIN TRANSPOSITION Left 01/27/2022   Procedure: LEFT SECOND STAGE BASILIC VEIN TRANSPOSITION;  Surgeon: Young Hensen, MD;  Location: [facility] OR;  Service: Vascular;  Laterality: Left;   BASCILIC VEIN TRANSPOSITION Right 02/18/2023   Procedure: SECOND STAGE RIGHT ARM BASILIC VEIN TRANSPOSITION;  Surgeon: Young Hensen, MD;  Location: [facility] OR;  Service: Vascular;  Laterality: Right;   BIOPSY  08/20/2022   Procedure: BIOPSY;  Surgeon: Annis Kinder, DO;  Location: [facility];  Service: Gastroenterology;;   COLONOSCOPY WITH PROPOFOL  N/A 08/20/2022   Procedure: COLONOSCOPY WITH PROPOFOL ;  Surgeon: Annis Kinder, DO;  Location: [facility];  Service: Gastroenterology;  Laterality: N/A;   DIALYSIS/PERMA CATHETER INSERTION Right 03/05/2023   Procedure: DIALYSIS/PERMA CATHETER INSERTION;  Surgeon: Young Hensen, MD;  Location: [facility] INVASIVE CV LAB;  Service: Cardiovascular;  Laterality: Right;   ESOPHAGOGASTRODUODENOSCOPY (EGD) WITH PROPOFOL  N/A 08/20/2022   Procedure: ESOPHAGOGASTRODUODENOSCOPY (  EGD) WITH PROPOFOL ;  Surgeon: Annis Kinder, DO;  Location: [facility];  Service: Gastroenterology;  Laterality: N/A;   INSERTION OF DIALYSIS CATHETER Right 05/01/2022   Procedure: INSERTION OF DIALYSIS CATHETER;  Surgeon: Carlene Che, MD;  Location: [facility] OR;  Service: Vascular;  Laterality: Right;   INSERTION OF DIALYSIS CATHETER Right 07/10/2023   Procedure: ULTRASOUNDED  GUIDED INSERTION OF TUNNELED  DIALYSIS CATHETER TO RIGHT FEMORAL VEIN;  Surgeon: Young Hensen, MD;  Location: [facility];  Service: Vascular;  Laterality: Right;   IR FLUORO GUIDE CV LINE RIGHT  12/07/2023   IR REMOVAL TUN CV CATH W/O FL  12/19/2022   IR REMOVAL TUN CV CATH W/O FL  11/18/2023   IR US  GUIDE VASC ACCESS RIGHT  12/07/2023   LIGATION OF COMPETING BRANCHES OF ARTERIOVENOUS FISTULA Left 07/15/2021   Procedure: LIGATION OF COMPETING BRANCHES OF LEFT RADIOCEPHALIC ARTERIOVENOUS FISTULA TIMES TWO;  Surgeon: Young Hensen, MD;  Location: [facility];  Service: Vascular;  Laterality: Left;   PERIPHERAL VASCULAR BALLOON ANGIOPLASTY  07/17/2023   Procedure: PERIPHERAL VASCULAR BALLOON ANGIOPLASTY;  Surgeon: Carlene Che, MD;  Location: [facility] INVASIVE CV LAB;  Service: Cardiovascular;;   PERIPHERAL VASCULAR INTERVENTION  06/17/2023   Procedure: PERIPHERAL VASCULAR INTERVENTION;  Surgeon: Kayla Part, MD;  Location: [facility] INVASIVE CV LAB;  Service: Cardiovascular;;   POLYPECTOMY  08/20/2022   Procedure: POLYPECTOMY;  Surgeon: Annis Kinder, DO;  Location: [facility];  Service: Gastroenterology;;   REVISON OF ARTERIOVENOUS FISTULA Left 07/15/2021   Procedure: REVISON OF LEFT ARTERIOVENOUS FISTULA;  Surgeon: Young Hensen, MD;  Location: [facility] OR;  Service: Vascular;  Laterality: Left;   REVISON OF ARTERIOVENOUS FISTULA Left 05/01/2022   Procedure: ARTERIOVENOUS FISTULA WASHOUT OF ARM HEMATOMA;  Surgeon: Carlene Che, MD;  Location: [facility] OR;  Service: Vascular;  Laterality: Left;   TUNNELLED CATHETER EXCHANGE N/A 10/23/2023   Procedure: TUNNELLED CATHETER EXCHANGE;  Surgeon: Baron Border, MD;  Location: [facility] INVASIVE CV LAB;  Service: Cardiovascular;  Laterality: N/A;   UPPER EXTREMITY VENOGRAPHY Bilateral 08/28/2023   Procedure: UPPER EXTREMITY VENOGRAPHY;  Surgeon: Carlene Che, MD;  Location: [facility] INVASIVE CV LAB;  Service: Cardiovascular;  Laterality: Bilateral;   Family History   Family History  Problem Relation Age of Onset   Heart disease Mother    Heart attack Sister 45   Social History  reports that he has been smoking cigars and cigarettes. He has been exposed to tobacco smoke. He has never used smokeless tobacco. He reports that he does not currently use alcohol after a past usage of about 14.0 standard drinks of alcohol per week. He reports current drug use. Frequency: 1.00 time per week. Drugs: Marijuana and Cocaine . Allergies  Allergies  Allergen Reactions   Enalapril  Other (See Comments)    Renal failure syndrome   Gabapentin      Encephalopathy, tremor   Iodinated Contrast Media Nausea And Vomiting   Home medications Prior to Admission medications   Medication Sig Start Date End Date Taking? Authorizing Provider  albuterol  (VENTOLIN  HFA) 108 (90 Base) MCG/ACT inhaler Inhale 2 puffs into the lungs every 6 (six) hours as needed (COPD).    [provider]  amLODipine  (NORVASC ) 10 MG tablet Take 1 tablet (10 mg total) by mouth daily. Patient taking differently: Take 10 mg by mouth daily. Hold for systolic BP <100 1/61/09   Lenise Quince, MD  apixaban  (ELIQUIS ) 5 MG TABS tablet Take 1 tablet (5 mg total)  by mouth 2 (two) times daily. 03/06/23   Atway, Rayann N, DO  ARIPiprazole  (ABILIFY ) 5 MG tablet Take 1 tablet (5 mg total) by mouth daily. 11/20/23   Ghimire, Estil Heman, MD  B Complex-C-Folic Acid  (DIALYVITE TABLET) TABS Take 1 tablet by mouth daily. 12/23/22   [provider]  carvedilol  (COREG ) 12.5 MG tablet Take 12.5 mg by mouth 2 (two) times daily with a meal.    [provider]  escitalopram  (LEXAPRO ) 20 MG tablet Take 0.5 tablets (10 mg total) by mouth at bedtime. 11/19/23   Ghimire, Estil Heman, MD  ferric citrate (AURYXIA) 1 GM 210 MG(Fe) tablet Take 210 mg by mouth 3 (three) times daily with meals.    [provider]  furosemide  (LASIX ) 40 MG tablet Take 40 mg by mouth daily.    [provider]   hydrALAZINE  (APRESOLINE ) 25 MG tablet Take 25 mg by mouth 2 (two) times daily.    [provider]  isosorbide  mononitrate (IMDUR ) 30 MG 24 hr tablet Take 30 mg by mouth daily.    [provider]  lidocaine  (LIDODERM ) 5 % Place 2 patches onto the skin daily. Remove & Discard patch within 12 hours or as directed by MD    [provider]  naloxone  (NARCAN ) nasal spray 4 mg/0.1 mL Place 0.4 mg into the nose See admin instructions.  SPRAY 1 SPRAY INTO ONE NOSTRIL AS DIRECTED AS NEEDED FOR RESCUE FOR OPIOID OVERDOSE - CALL 911 IMMEDIATELY, ADMINISTER DOSE, THEN TURN PERSON ON SIDE - IF NO RESPONSE IN 2-3 MINUTES OR PERSON RESPONDS BUT RELAPSES, REPEAT USING A NEW SPRAY DEVICE AND SPRAY INTO THE OTHER NOSTRIL AS NEEDED FOR RESCUE 01/21/23   [provider]  omeprazole  (PRILOSEC) 40 MG capsule Take 1 capsule (40 mg total) by mouth daily. Patient taking differently: Take 40 mg by mouth daily as needed. 06/06/22   Kennedy-Smith, Colleen M, NP  ondansetron  (ZOFRAN -ODT) 8 MG disintegrating tablet Take 8 mg by mouth every 6 (six) hours. 11/06/23   [provider]  oxyCODONE -acetaminophen  (PERCOCET/ROXICET) 5-325 MG tablet Take 1 tablet by mouth every 4 (four) hours as needed for severe pain (pain score 7-10). 10/20/23   Carlene Che, MD  PSYLLIUM PO Take by mouth. 09/03/23   [provider]  rosuvastatin  (CRESTOR ) 10 MG tablet Take 10 mg by mouth daily.    [provider]  sevelamer  carbonate (RENVELA ) 800 MG tablet Take 1,600 mg by mouth 3 (three) times daily with meals. 01/06/23   [provider]  sorbitol 70 % solution Take 30 mLs by mouth 3 (three) times daily as needed (constipation). 01/20/23   [provider]  tamsulosin  (FLOMAX ) 0.4 MG CAPS capsule Take 0.4 mg by mouth at bedtime.    [provider]  Tiotropium Bromide-Olodaterol 2.5-2.5 MCG/ACT AERS Take 2 puffs by mouth in the morning. 08/07/23   [provider]     Vitals:   12/09/23 1610 12/09/23 1004 12/09/23 1103 12/09/23 1145  BP:   (!) 140/103 (!) 141/96  Pulse:   92   Resp:  (!) 30 (!) 41 (!) 22  Temp: 98.3 F (36.8 C)     TempSrc: Oral     SpO2:  (!) 82% 100%   Weight:      Height:       Exam Gen alert, no distress, on RA, 99% spO2 No rash, cyanosis or gangrene Sclera anicteric, throat clear  No jvd or bruits Chest clear bilat to  bases, no rales/ wheezing RRR no MRG Abd soft ntnd no mass or ascites +bs GU nl male MS no joint effusions or deformity Ext 1+ bilat LE edema, no other edema Neuro is alert, Ox 3 , nf    RUA AVG no bruit      Renal-related home meds: Norvasc  10 mg daily Coreg  12.5 twice daily Auryxia 1 AC 3 times daily Lasix  40 mg daily Hydralazine  25 twice daily Renvela  2 AC 3 times daily    OP HD: TTS NW 4h   B400   RUE AVG  Heparin  2500    Assessment/ Plan: Clotted AV graft: temp cath placed 4/28 by IR, pt left AMA w/ catheter in and went to HD next day but cath was clotted. IR consulting, appreciate assistance, planning on replacing temp cath given his symptoms and postpone any declot AVG procedures for now.  ESRD: on HD TTS. Last HD was last Thursday. HD later after access re-established.  HTN: BP's are high, cont home meds. Lower vol w/ HD.  Volume: mild-mod vol excess, up 4-5 kg per pt. On RA. Max UF w/ next HD.  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   Secondary hyperparathyroidism: CCa in range, cont binders ac    Rob Zana Hesselbach  MD [facility] 12/09/2023, 12:23 PM  Recent Labs  Lab 12/07/23 0647 12/07/23 0652 12/09/23 0214 12/09/23 0217  HGB 13.5   < > 11.6* 13.3  ALBUMIN   --   --  3.2*  --   CALCIUM  9.3  --  9.2  --   CREATININE 9.93*   < > 11.37* 11.60*  K 5.5*   < > 4.2 4.2   < > = values in this interval not displayed.   Inpatient medications:  nicotine  21 mg Transdermal Daily   sodium chloride  flush  3 mL Intravenous Q12H    acetaminophen  **OR** acetaminophen , albuterol ,  hydrALAZINE , ondansetron  **OR** ondansetron  (ZOFRAN ) IV, oxyCODONE -acetaminophen 

## 2023-12-09 NOTE — Progress Notes (Signed)
 Right upper arm dialysis graft  has been completed. Refer to [facility] under chart review to view preliminary results.   12/09/2023  3:36 PM Slayden Mennenga, Hollace Lund

## 2023-12-09 NOTE — ED Triage Notes (Addendum)
 PT BIB [facility] from home after leaving AMA today with a c/o SOB. SOB has been ongoing since Sun. PT has not had dialysis since SAT because of his clogged port. PT is 3 and 1/2kg over weight. Lung sounds clear, ascites and bilateral leg swelling noted.98% on room air.PT's port is still clotted.PT states if his port is not unclogged today , he will leave again.

## 2023-12-09 NOTE — Progress Notes (Signed)
 Pt refusing to wear oxygen; refusing bed alarm; refusing staff assistance with ambulation in room. Manny Sees, MD aware.

## 2023-12-09 NOTE — ED Notes (Signed)
 Pt states that he is not able to breath. Pt agreed to sit in bed for this nurse to get his vials. Spo2 was 82% on room air, placed on 3L via [place]

## 2023-12-09 NOTE — Progress Notes (Signed)
 Patient refused VS

## 2023-12-09 NOTE — ED Notes (Signed)
 Pt educated on reasons why he is not able to walk the halls of the emergency department. Pt states that he does not care and will "fall at my own risk".

## 2023-12-09 NOTE — Progress Notes (Signed)
Pre Hd 

## 2023-12-09 NOTE — Progress Notes (Signed)
 Pt refusing tele monitoring. Manny Sees, MD aware.

## 2023-12-10 ENCOUNTER — Ambulatory Visit (HOSPITAL_COMMUNITY)
Admission: RE | Admit: 2023-12-10 | Discharge: 2023-12-10 | Disposition: A | Discharge status: Home / Self Care | Attending: Nephrology | Admitting: Nephrology

## 2023-12-10 ENCOUNTER — Encounter (HOSPITAL_COMMUNITY)
Admission: RE | Disposition: A | Discharge status: Home / Self Care | Payer: Self-pay | Source: Home / Self Care | Attending: Nephrology

## 2023-12-10 ENCOUNTER — Other Ambulatory Visit: Payer: Self-pay

## 2023-12-10 DIAGNOSIS — I120 Hypertensive chronic kidney disease with stage 5 chronic kidney disease or end stage renal disease: Secondary | ICD-10-CM | POA: Diagnosis not present

## 2023-12-10 DIAGNOSIS — Y832 Surgical operation with anastomosis, bypass or graft as the cause of abnormal reaction of the patient, or of later complication, without mention of misadventure at the time of the procedure: Secondary | ICD-10-CM | POA: Insufficient documentation

## 2023-12-10 DIAGNOSIS — N2581 Secondary hyperparathyroidism of renal origin: Secondary | ICD-10-CM | POA: Diagnosis not present

## 2023-12-10 DIAGNOSIS — T82858A Stenosis of vascular prosthetic devices, implants and grafts, initial encounter: Secondary | ICD-10-CM | POA: Insufficient documentation

## 2023-12-10 DIAGNOSIS — D631 Anemia in chronic kidney disease: Secondary | ICD-10-CM | POA: Diagnosis not present

## 2023-12-10 DIAGNOSIS — N186 End stage renal disease: Secondary | ICD-10-CM | POA: Diagnosis not present

## 2023-12-10 DIAGNOSIS — T82868A Thrombosis of vascular prosthetic devices, implants and grafts, initial encounter: Secondary | ICD-10-CM | POA: Insufficient documentation

## 2023-12-10 DIAGNOSIS — Z992 Dependence on renal dialysis: Secondary | ICD-10-CM | POA: Insufficient documentation

## 2023-12-10 HISTORY — PX: DIALYSIS/PERMA CATHETER INSERTION: CATH118288

## 2023-12-10 HISTORY — PX: A/V SHUNT INTERVENTION: CATH118220

## 2023-12-10 SURGERY — A/V SHUNT INTERVENTION
Anesthesia: LOCAL

## 2023-12-10 MED ORDER — HEPARIN SODIUM (PORCINE) 1000 UNIT/ML IJ SOLN
INTRAMUSCULAR | Status: AC
  Filled 2023-12-10: qty 10

## 2023-12-10 MED ORDER — HEPARIN (PORCINE) IN NACL 1000-0.9 UT/500ML-% IV SOLN
INTRAVENOUS | Status: DC | PRN
  Administered 2023-12-10: 500 mL

## 2023-12-10 MED ORDER — MIDAZOLAM HCL 2 MG/2ML IJ SOLN
INTRAMUSCULAR | Status: DC | PRN
Start: 2023-12-10 — End: 2023-12-10
  Administered 2023-12-10: 1 mg via INTRAVENOUS

## 2023-12-10 MED ORDER — FENTANYL CITRATE (PF) 100 MCG/2ML IJ SOLN
INTRAMUSCULAR | Status: AC
  Filled 2023-12-10: qty 2

## 2023-12-10 MED ORDER — FENTANYL CITRATE (PF) 100 MCG/2ML IJ SOLN
INTRAMUSCULAR | Status: DC | PRN
  Administered 2023-12-10 (×2): 25 ug via INTRAVENOUS

## 2023-12-10 MED ORDER — MIDAZOLAM HCL 2 MG/2ML IJ SOLN
INTRAMUSCULAR | Status: AC
  Filled 2023-12-10: qty 2

## 2023-12-10 MED ORDER — IODIXANOL 320 MG/ML IV SOLN
INTRAVENOUS | Status: DC | PRN
  Administered 2023-12-10: 2 mL

## 2023-12-10 MED ORDER — HEPARIN SODIUM (PORCINE) 1000 UNIT/ML IJ SOLN
INTRAMUSCULAR | Status: DC | PRN
  Administered 2023-12-10: 4000 [IU] via INTRAVENOUS

## 2023-12-10 MED ORDER — LIDOCAINE HCL (PF) 1 % IJ SOLN
INTRAMUSCULAR | Status: AC
  Filled 2023-12-10: qty 30

## 2023-12-10 MED ORDER — SODIUM CHLORIDE 0.9 % IV SOLN: INTRAVENOUS | Status: DC

## 2023-12-10 MED ORDER — LIDOCAINE HCL (PF) 1 % IJ SOLN
INTRAMUSCULAR | Status: DC | PRN
  Administered 2023-12-10: 2 mL via INTRADERMAL

## 2023-12-10 SURGICAL SUPPLY — 10 items
BALLOON FOGARTY 5FR 40 (CATHETERS) IMPLANT
BALLOON MUSTANG 7X80X75 (BALLOONS) IMPLANT
CATH MACH 1 ST 7FR 55 (CATHETERS) IMPLANT
CATH PALINDROME-P 23 W/VT (CATHETERS) IMPLANT
DEVICE TORQUE SEADRAGON GRN (MISCELLANEOUS) IMPLANT
GLIDEWIRE STIFF .35X180X3 HYDR (WIRE) IMPLANT
GUIDEWIRE ANGLED .035 180CM (WIRE) IMPLANT
SHEATH PINNACLE R/O II 5F 6CM (SHEATH) IMPLANT
SHEATH PINNACLE R/O II 7F 4CM (SHEATH) IMPLANT
SYR MEDALLION 10ML (SYRINGE) IMPLANT

## 2023-12-10 NOTE — Op Note (Signed)
 Patient referred for a thrombectomy of his right upper arm AVG placed on 10/19/2023. This is his first endovascular intervention on the current access; he just left the hospital AGAINST MEDICAL ADVICE with a temporary right IJ dialysis catheter that was placed by interventional radiology. On examination there is no pulse or bruit in the left upper arm arc graft. The right radial pulse is 1+.   Summary:  1) Unsuccessful thrombectomy of the right upper arm arc AVG with evidence of significant venous anastomosis-wire unfortunately tract extravascularly and unable to get into central circulation to offer management. 2) Placed right IJ tunneled dialysis catheter and exchanged for his temporary right IJ dialysis catheter for continued provision of dialysis. 3)         We will schedule reattempt thrombectomy in 1 week to see if we can salvage AV graft.     Description of procedure: Procedure #1 (Unsuccessful thrombectomy): The right arm was prepped and draped in the usual fashion. The right upper arm arc AVG was first cannulated (16109) in the arterial limb in an antegrade direction and then a 7Fr sheath was inserted by guidewire exchange technique. A 0.035 guidewire was passed through the clotted graft into the central veins under fluoroscopic guidance and then I attempted to advance a 5Fr diagnostic catheter inserted over the wire which could not advance past the venous anastomosis. The catheter was removed, and I advanced a 7 x 80 mm Mustang angioplasty balloon and positioned it at the site of venous anastomosis with venous angioplasty (60454) carried out to full effacement at 16 ATM pressure via hand syringe assembly of what was an apparent 70% venous anastomosis stenosis.  I then removed the balloon and attempted to advance the diagnostic catheter centrally but could not navigated beyond the venous anastomosis.  At this point, thrombectomy could not be continued/carried out without ability to navigate centrally  beyond the venous anastomosis.  The procedure was deemed successful and the patient consented for placement of tunneled hemodialysis catheter.  Description of procedure: Procedure #2 (Placement of right IJ tunneled hemodialysis catheter):  The decision was made to place a RIJ tunneled dialysis catheter to replace the temporary right IJ dialysis catheter in place.  Summary:  1) Successful placement of a new 23 cm cuff to tip hemodialysis catheter (Palindrome) in the right internal jugular vein with the tip in the right atrium.  2) Recommendations to the dialysis unit TO NOT USE HEPARIN  FOR THE FIRST 24 HOURS.  Description of procedure: The right neck, chest and the catheter were prepped and draped in the usual sterile fashion.  Local anesthesia was provided by injecting lidocaine  1% at the neck site overlying the venotomy site around the site of the right IJ temporary dialysis catheter.  I then advance the guidewire easily into the central circulation via the pigtail port of the temporary dialysis catheter. The wire was then manipulated and advanced into the abdominal IVC. I then blunt dissected the tissue around the temporary catheter until it was freely mobile. Then, after injecting local anesthesia into the desired track and exit site I made a small incision at the exit site and tunneled a 23 cm cuff to tip Palindrome dialysis catheter, pulling it out at the venotomy site.Sequential dilation with 12, 14, and finally the peelaway sheath were done with real time fluoroscopic guidance ensuring that we were straight always lined with the wire.   Then, I inserted the catheter over the wire and through the sheath. After removing the peelaway sheath,  I adjusted the catheter until the tip of the catheter was positioned in the right atrium of the heart. The cuff of the catheter was positioned in the subcutaneous tunnel with the cuff approximately 2 cm from the exit site.   Both limbs of the catheter were  aspirated and flushed with excellent flow noted. Both limbs of the catheter were locked with heparin  and sterile caps placed. The hub of the catheter was sutured to the chest wall with 2-0 nylon suture.   The neck incision was closed with a vertical mattress 3-0 plain gut sutures. 2-0 loose nylon sutures secured the hub to the chest wall.                         Sterile dressings were placed, and the patient returned to recovery in stable condition.   Hemostasis: A 3-0 ethilon purse string suture was placed at the cannulation site on removal of the sheath from the attempted thrombectomy site.  Sedation: 1mg  Versed , 50mcg Fentanyl . Sedation time. 51  minutes  Contrast. 2 mL  Monitoring: Because of the patient's comorbid conditions and sedation during the procedure, continuous EKG monitoring and O2 saturation monitoring was performed throughout the procedure by the RN. There were no abnormal arrhythmias encountered.  Complications: None.   Diagnoses: N18.6 End stage renal disease  T82.858A Stenosis of vascular prosthetic devices, implants and grafts, initial encounter T82.868A Thrombosis of vascular prosthetic device/graft, initial  Procedure Coding:  [phone] Cannulation and angiogram of fistula/ graft, angiogram and venous angioplasty 60454 Tunneled catheter insertion U9811 Contrast  Recommendations: Remove the catheter waiting suture in 3 weeks.  Remove suture from right upper arm AV graft today (12/10/2023) at dialysis. 2.   Report any blood flow problems to [facility]. 3.  Rescheduled to return back to [facility] in 1 week for reattempt at thrombectomy of right upper arm graft.  Discharge: The patient was discharged home in stable condition. The patient was given education regarding the care of the dialysis catheter and specific instructions in case of any problems.

## 2023-12-10 NOTE — Discharge Summary (Signed)
 Physician Discharge Summary   Patient: Ronald Reeves MRN: [id number] DOB: 1956/03/13  Admit date:     12/09/2023  Discharge date: 12/09/2023  Discharge Physician: Lena Qualia   PCP: Doak Free, MD   Recommendations at discharge:   Patient left AGAINST MEDICAL ADVICE following dialysis  Discharge Diagnoses: Principal Problem:   SOB (shortness of breath) Active Problems:   ESRD (end stage renal disease) (HCC)   Hypervolemia   Clotted renal dialysis AV graft (HCC)   Paroxysmal atrial fibrillation (HCC)   Essential hypertension   Pancytopenia (HCC)   Hyperlipidemia   Polysubstance abuse (HCC)  Resolved Problems:   * No resolved hospital problems. *  Hospital Course: Ronald Reeves is a 68 y.o. male with medical history significant of ESRD on HD, hypertension, paroxysmal atrial fibrillation on Eliquis , HFrEF, GERD, and polysubstance abuse presents with complaints of shortness of breath.   Records note he was recently hospitalized on 4/28 due to his fistula clotting off and him being unable to dialyze on 4/26.  He had a temporary right IJ hemodialysis catheter placed by interventional radiology with plans for image guided right arteriovenous fistula declot the following day on 4/29.  However, patient left AGAINST MEDICAL ADVICE after the temporary dialysis catheter procedure had been placed while he was waiting dialysis.  He had gone to dialysis yesterday, but notes that the catheter was not working.   He has been experiencing difficulty breathing and lower extremity swelling.  The fistula in his right arm has been there for at least 3 weeks. He smokes black and brown cigarettes, about one or two a day, and uses cocaine , with his last use being the night before before coming to the hospital.   He is currently taking Eliquis , a blood thinner, and took his last dose the night before the visit.   In the emergency department patient was noted to be tachypneic with blood pressures  elevated up to 149/83, and O2 saturation maintained on room air.  Labs noted potassium 4.2, chloride 93, CO2 25, BUN 66, creatinine 11.37, glucose 92, and anion gap 20.  Chest x-ray revealed cardiomegaly with no frank interstitial edema and right IJ catheter in place.  He was given Zofran  and 50 mcg of fentanyl  IV.    Assessment and Plan: Shortness of breath secondary to hypervolemia ESRD on HD Clotted AV graft Elevated anion gap Patient presented with complaints of shortness of breath.  Recently hospitalized on 4/28 for right AV fistula malfunctioning and had temporary dialysis catheter placed.  Went to dialysis yesterday and reported catheter not functioning properly due to being clotted.  O2 saturations currently maintained on labs noted potassium 4.2, CO2 25, BUN 66, creatinine 11.37, and anion gap 20.  Chest x-ray noted cardiomegaly without frank edema and right IJ lumen catheter terminating at the cavoatrial junction.  IR consulted for need of dialysis.  Interventional radiology was consulted and exchanged temporary hemodialysis catheter.  Patient underwent dialysis and following dialysis left AGAINST MEDICAL ADVICE.  Paroxysmal atrial fibrillation on chronic anticoagulation Reports last dose of Eliquis  yesterday evening.  Patient currently in a sinus rhythm. - Held Eliquis  for pending procedure   Hypertension Initial blood pressures elevated up to 149/83.   - Resume prior home meds   Pancytopenia Hemoglobin 11.6 and platelet count 121 which appears around patient's baseline. - Continue to monitor   Hyperlipidemia - Continue Crestor    Polysubstance abuse Patient admits to recently using cocaine  yesterday prior to arrival to the hospital.  He also  reports smoking Black and milds cigarettes on a regular basis.  Suspect this is factoring into the patient's mood. - Continue to counsel on need of cessation of illicit drug use - Nicotine  patch offered - Transitions of care consulted for  drug abuse      Consultants: nephrology Procedures performed:   Disposition: Home Diet recommendation:   DISCHARGE MEDICATION: Allergies as of 12/09/2023       Reactions   Enalapril  Other (See Comments)   Renal failure syndrome   Gabapentin     Encephalopathy, tremor   Iodinated Contrast Media Nausea And Vomiting        Medication List     ASK your doctor about these medications    albuterol  108 (90 Base) MCG/ACT inhaler Commonly known as: VENTOLIN  HFA Inhale 2 puffs into the lungs every 6 (six) hours as needed (COPD).   amLODipine  10 MG tablet Commonly known as: NORVASC  Take 1 tablet (10 mg total) by mouth daily.   apixaban  5 MG Tabs tablet Commonly known as: ELIQUIS  Take 1 tablet (5 mg total) by mouth 2 (two) times daily.   ARIPiprazole  5 MG tablet Commonly known as: ABILIFY  Take 1 tablet (5 mg total) by mouth daily.   carvedilol  12.5 MG tablet Commonly known as: COREG  Take 12.5 mg by mouth 2 (two) times daily with a meal.   DIALYVITE  TABLET Tabs Take 1 tablet by mouth daily.   escitalopram  20 MG tablet Commonly known as: LEXAPRO  Take 0.5 tablets (10 mg total) by mouth at bedtime.   ferric citrate  1 GM 210 MG(Fe) tablet Commonly known as: AURYXIA Take 210 mg by mouth 3 (three) times daily with meals.   furosemide  40 MG tablet Commonly known as: LASIX  Take 40 mg by mouth daily.   hydrALAZINE  25 MG tablet Commonly known as: APRESOLINE  Take 25 mg by mouth 2 (two) times daily.   isosorbide  mononitrate 30 MG 24 hr tablet Commonly known as: IMDUR  Take 30 mg by mouth daily.   lidocaine  5 % Commonly known as: LIDODERM  Place 2 patches onto the skin daily. Remove & Discard patch within 12 hours or as directed by MD   naloxone  4 MG/0.1ML Liqd nasal spray kit Commonly known as: NARCAN  Place 0.4 mg into the nose See admin instructions.  SPRAY 1 SPRAY INTO ONE NOSTRIL AS DIRECTED AS NEEDED FOR RESCUE FOR OPIOID OVERDOSE - CALL 911 IMMEDIATELY, ADMINISTER  DOSE, THEN TURN PERSON ON SIDE - IF NO RESPONSE IN 2-3 MINUTES OR PERSON RESPONDS BUT RELAPSES, REPEAT USING A NEW SPRAY DEVICE AND SPRAY INTO THE OTHER NOSTRIL AS NEEDED FOR RESCUE   omeprazole  40 MG capsule Commonly known as: PRILOSEC Take 1 capsule (40 mg total) by mouth daily.   ondansetron  8 MG disintegrating tablet Commonly known as: ZOFRAN -ODT Take 8 mg by mouth every 6 (six) hours.   oxyCODONE -acetaminophen  5-325 MG tablet Commonly known as: PERCOCET/ROXICET Take 1 tablet by mouth every 4 (four) hours as needed for severe pain (pain score 7-10).   PSYLLIUM PO Take by mouth.   rosuvastatin  10 MG tablet Commonly known as: CRESTOR  Take 10 mg by mouth daily.   sevelamer  carbonate 800 MG tablet Commonly known as: RENVELA  Take 1,600 mg by mouth 3 (three) times daily with meals.   sorbitol 70 % solution Take 30 mLs by mouth 3 (three) times daily as needed (constipation).   tamsulosin  0.4 MG Caps capsule Commonly known as: FLOMAX  Take 0.4 mg by mouth at bedtime.   Tiotropium Bromide-Olodaterol 2.5-2.5 MCG/ACT Aers Take  2 puffs by mouth in the morning.        Discharge Exam: Filed Weights   12/09/23 0035 12/09/23 1744  Weight: 66.9 kg 68.2 kg   Patient left AGAINST MEDICAL ADVICE so no exam was performed  Condition at discharge:   The results of significant diagnostics from this hospitalization (including imaging, microbiology, ancillary and laboratory) are listed below for reference.   Imaging Studies: PERIPHERAL VASCULAR CATHETERIZATION Result Date: 12/10/2023   on 12/10/2023.   Okay to discharge home anytime after 12:50 PM as long as clinically stable Patient referred for a thrombectomy of his right upper arm AVG placed on 10/19/2023. This is his first endovascular intervention on the current access; he just left the hospital AGAINST MEDICAL ADVICE with a temporary right IJ dialysis catheter that was placed by interventional radiology. On examination there is no  pulse or bruit in the left upper arm arc graft. The right radial pulse is 1+. Summary: 1) Unsuccessful thrombectomy of the right upper arm arc AVG with evidence of significant venous anastomosis-wire unfortunately tract extravascularly and unable to get into central circulation to offer management. 2) Placed right IJ tunneled dialysis catheter and exchanged for his temporary right IJ dialysis catheter for continued provision of dialysis. 3)         We will schedule reattempt thrombectomy in 1 week to see if we can salvage AV graft.   Description of procedure: Procedure #1 (Unsuccessful thrombectomy): The right arm was prepped and draped in the usual fashion. The right upper arm arc AVG was first cannulated (16109) in the arterial limb in an antegrade direction and then a 7Fr sheath was inserted by guidewire exchange technique. A 0.035 guidewire was passed through the clotted graft into the central veins under fluoroscopic guidance and then I attempted to advance a 5Fr diagnostic catheter inserted over the wire which could not advance past the venous anastomosis. The catheter was removed, and I advanced a 7 x 80 mm Mustang angioplasty balloon and positioned it at the site of venous anastomosis with venous angioplasty (60454) carried out to full effacement at 16 ATM pressure via hand syringe assembly of what was an apparent 70% venous anastomosis stenosis.  I then removed the balloon and attempted to advance the diagnostic catheter centrally but could not navigated beyond the venous anastomosis.  At this point, thrombectomy could not be continued/carried out without ability to navigate centrally beyond the venous anastomosis.  The procedure was deemed successful and the patient consented for placement of tunneled hemodialysis catheter. Description of procedure: Procedure #2 (Placement of right IJ tunneled hemodialysis catheter): The decision was made to place a RIJ tunneled dialysis catheter to replace the temporary  right IJ dialysis catheter in place. Summary: 1) Successful placement of a new 23 cm cuff to tip hemodialysis catheter (Palindrome) in the right internal jugular vein with the tip in the right atrium. 2) Recommendations to the dialysis unit TO NOT USE HEPARIN  FOR THE FIRST 24 HOURS. Description of procedure: The right neck, chest and the catheter were prepped and draped in the usual sterile fashion.  Local anesthesia was provided by injecting lidocaine  1% at the neck site overlying the venotomy site around the site of the right IJ temporary dialysis catheter.  I then advance the guidewire easily into the central circulation via the pigtail port of the temporary dialysis catheter. The wire was then manipulated and advanced into the abdominal IVC. I then blunt dissected the tissue around the temporary catheter until it was  freely mobile. Then, after injecting local anesthesia into the desired track and exit site I made a small incision at the exit site and tunneled a 23 cm cuff to tip Palindrome dialysis catheter, pulling it out at the venotomy site.Sequential dilation with 12, 14, and finally the peelaway sheath were done with real time fluoroscopic guidance ensuring that we were straight always lined with the wire. Then, I inserted the catheter over the wire and through the sheath. After removing the peelaway sheath, I adjusted the catheter until the tip of the catheter was positioned in the right atrium of the heart. The cuff of the catheter was positioned in the subcutaneous tunnel with the cuff approximately 2 cm from the exit site.   Both limbs of the catheter were aspirated and flushed with excellent flow noted. Both limbs of the catheter were locked with heparin  and sterile caps placed. The hub of the catheter was sutured to the chest wall with 2-0 nylon suture.   The neck incision was closed with a vertical mattress 3-0 plain gut sutures. 2-0 loose nylon sutures secured the hub to the chest wall.                        Sterile dressings were placed, and the patient returned to recovery in stable condition. Hemostasis: A 3-0 ethilon purse string suture was placed at the cannulation site on removal of the sheath from the attempted thrombectomy site. Sedation: 1mg  Versed , 50mcg Fentanyl . Sedation time. 51  minutes Contrast. 2 mL Monitoring: Because of the patient's comorbid conditions and sedation during the procedure, continuous EKG monitoring and O2 saturation monitoring was performed throughout the procedure by the RN. There were no abnormal arrhythmias encountered. Complications: None. Diagnoses: N18.6 End stage renal disease T82.858A Stenosis of vascular prosthetic devices, implants and grafts, initial encounter T82.868A Thrombosis of vascular prosthetic device/graft, initial Procedure Coding: 16109 Cannulation and angiogram of fistula/ graft, angiogram and venous angioplasty 60454 Tunneled catheter insertion U9811 Contrast Recommendations: Remove the catheter waiting suture in 3 weeks.  Remove suture from right upper arm AV graft today (12/10/2023) at dialysis. 2.   Report any blood flow problems to [facility]. 3.  Rescheduled to return back to [facility] in 1 week for reattempt at thrombectomy of right upper arm graft. Discharge: The patient was discharged home in stable condition. The patient was given education regarding the care of the dialysis catheter and specific instructions in case of any problems.   VAS US  DUPLEX DIALYSIS ACCESS (AVF, AVG) Result Date: 12/09/2023 DIALYSIS ACCESS Patient Name:  TAICHI RAMIREZ  Date of Exam:   12/09/2023 Medical Rec #: [id number]   Accession #:    [id number] Date of Birth: [birth year]   Patient Gender: M Patient Age:   82 years Exam Location:  [facility] Procedure:      VAS US  DUPLEX DIALYSIS ACCESS (AVF, AVG) Referring Phys: KACIE MATTHEWS --------------------------------------------------------------------------------  Reason for Exam: Unable to dialyze through AVF/AVG.  Access Site: Right Upper Extremity. Access Type: Upper arm straight graft. History: Right AVG creation 10/19/23.          Failed Right arm AVF 4/24.          Failed left arm AVF 3/23. Performing Technologist: Franky Ivanoff Sturdivant-Jones RDMS, RVT  Examination Guidelines: A complete evaluation includes B-mode imaging, spectral Doppler, color Doppler, and power Doppler as needed of all accessible portions of each vessel. Unilateral testing is considered an integral part of a complete examination. Limited  examinations for reoccurring indications may be performed as noted.  Findings:   +--------------------+----------+-----------------+--------+ AVG                 PSV (cm/s)Flow Vol (mL/min)Describe +--------------------+----------+-----------------+--------+ Native artery inflow    50                              +--------------------+----------+-----------------+--------+ Arterial anastomosis    0                      occluded +--------------------+----------+-----------------+--------+ Prox graft              0                      occluded +--------------------+----------+-----------------+--------+ Mid graft               0                      occluded +--------------------+----------+-----------------+--------+ Distal graft            0                      occluded +--------------------+----------+-----------------+--------+ Venous anastomosis      0                      occluded +--------------------+----------+-----------------+--------+  Summary: Occluded right upper extremity AVG.  *See table(s) above for measurements and observations.  Diagnosing physician: Genny Kid MD Electronically signed by Genny Kid MD on 12/09/2023 at 8:29:20 PM.    --------------------------------------------------------------------------------   Final    IR Fluoro Guide CV Line Right Result Date: 12/09/2023 CLINICAL DATA:  Hemodialysis access catheter placement EXAM: IR RIGHT FLUORO GUIDE CV  LINE COMPARISON:  Chest radiograph 12/09/2023 FINDINGS: Intraoperative fluoroscopy is utilized for surgical control purposes. Fluoroscopy time is recorded at 0.2 minutes. Dose 1 mGy. A single cine image sequences provided. Images obtained demonstrate catheter placement at the cavoatrial junction level. IMPRESSION: Intraoperative fluoroscopy utilized for surgical control purposes during catheter placement. Electronically Signed   By: Boyce Byes M.D.   On: 12/09/2023 17:19   DG Chest Port 1 View Result Date: 12/09/2023 EXAM: 1 VIEW XRAY OF THE CHEST 12/09/2023 01:12:21 AM COMPARISON: 12/07/2023 CLINICAL HISTORY: SOB. SOB FINDINGS: LUNGS AND PLEURA: No consolidation. No pulmonary edema. No pleural effusion. No pneumothorax. HEART AND MEDIASTINUM: Cardiomegaly. No acute abnormality of the cardiac and mediastinal silhouettes. BONES AND SOFT TISSUES: No acute osseous abnormality. Thoracic aortic atherosclerosis. LINES AND TUBES: Right IJ dual lumen catheter terminating at the cavoatrial junction, new and in appropriate position. IMPRESSION: 1. Cardiomegaly. No frank interstitial edema, improved. 2. Right IJ dual lumen catheter terminating at the cavoatrial junction, new. Electronically signed by: Zadie Herter MD 12/09/2023 01:15 AM EDT RP Workstation: [id number]   IR Fluoro Guide CV Line Right Result Date: 12/08/2023 CLINICAL DATA:  Renal insufficiency, needs access for hemodialysis EXAM: EXAM RIGHT IJ CATHETER PLACEMENT UNDER ULTRASOUND AND FLUOROSCOPIC GUIDANCE TECHNIQUE: The procedure, risks (including but not limited to bleeding, infection, organ damage, pneumothorax), benefits, and alternatives were explained to the patient. Questions regarding the procedure were encouraged and answered. The patient understands and consents to the procedure. Patency of the right IJ vein was confirmed with ultrasound with image documentation. An appropriate skin site was determined. Skin site was marked. Region was  prepped using maximum barrier technique including cap and mask, sterile  gown, sterile gloves, large sterile sheet, and Chlorhexidine  as cutaneous antisepsis. The region was infiltrated locally with 1% lidocaine . Under real-time ultrasound guidance, the right IJ vein was accessed with a 21 gauge needle; the needle tip within the vein was confirmed with ultrasound image documentation. The needle exchanged over a 018 guidewire for vascular dilator which allowed advancement of a 16 cm Mahurkar catheter. This was positioned with the tip at the cavoatrial junction. Spot chest radiograph shows good positioning and no pneumothorax. Catheter was flushed and sutured externally with 0-Prolene sutures. Patient tolerated the procedure well. FLUOROSCOPY TIME:  Radiation Exposure Index (as provided by the fluoroscopic device): 1.5 mGy air Kerma COMPLICATIONS: COMPLICATIONS none IMPRESSION: 1. Technically successful right IJ 16 cm Mahurkar catheter placement. Electronically Signed   By: Nicoletta Barrier M.D.   On: 12/08/2023 11:48   IR US  Guide Vasc Access Right Result Date: 12/08/2023 CLINICAL DATA:  Renal insufficiency, needs access for hemodialysis EXAM: EXAM RIGHT IJ CATHETER PLACEMENT UNDER ULTRASOUND AND FLUOROSCOPIC GUIDANCE TECHNIQUE: The procedure, risks (including but not limited to bleeding, infection, organ damage, pneumothorax), benefits, and alternatives were explained to the patient. Questions regarding the procedure were encouraged and answered. The patient understands and consents to the procedure. Patency of the right IJ vein was confirmed with ultrasound with image documentation. An appropriate skin site was determined. Skin site was marked. Region was prepped using maximum barrier technique including cap and mask, sterile gown, sterile gloves, large sterile sheet, and Chlorhexidine  as cutaneous antisepsis. The region was infiltrated locally with 1% lidocaine . Under real-time ultrasound guidance, the right IJ vein  was accessed with a 21 gauge needle; the needle tip within the vein was confirmed with ultrasound image documentation. The needle exchanged over a 018 guidewire for vascular dilator which allowed advancement of a 16 cm Mahurkar catheter. This was positioned with the tip at the cavoatrial junction. Spot chest radiograph shows good positioning and no pneumothorax. Catheter was flushed and sutured externally with 0-Prolene sutures. Patient tolerated the procedure well. FLUOROSCOPY TIME:  Radiation Exposure Index (as provided by the fluoroscopic device): 1.5 mGy air Kerma COMPLICATIONS: COMPLICATIONS none IMPRESSION: 1. Technically successful right IJ 16 cm Mahurkar catheter placement. Electronically Signed   By: Nicoletta Barrier M.D.   On: 12/08/2023 11:48   DG Chest Portable 1 View Result Date: 12/07/2023 CLINICAL DATA:  Shortness of breath.  Dialysis dependent. EXAM: PORTABLE CHEST 1 VIEW COMPARISON:  11/16/2023 FINDINGS: The cardio pericardial silhouette is enlarged. Vascular congestion noted with basilar predominant airspace disease suggesting edema. No substantial pleural effusion. No acute bony abnormality. Telemetry leads overlie the chest. IMPRESSION: Enlargement of the cardiopericardial silhouette with basilar predominant airspace disease suggesting edema. Electronically Signed   By: Donnal Fusi M.D.   On: 12/07/2023 07:49   IR Removal Tun Cv Cath W/O FL Result Date: 11/18/2023 INDICATION: Request removal of tunneled right femoral hemodialysis catheter. This was placed at outside facility. Catheter was partially dislodged. EXAM: REMOVAL OF TUNNELED RIGHT COMMON FEMORAL HEMODIALYSIS CATHETER MEDICATIONS: None COMPLICATIONS: None immediate. PROCEDURE: Informed written consent was obtained from the patient following an explanation of the procedure, risks, benefits and alternatives to treatment. A time out was performed prior to the initiation of the procedure. Maximal barrier sterile technique was utilized  including caps, mask, sterile gowns, sterile gloves, large sterile drape, hand hygiene, and chlorhexidine . The catheter was removed intact without difficulty. Hemostasis was obtained with manual compression. A dressing was placed. The patient tolerated the procedure well without immediate post procedural complication.  IMPRESSION: Successful removal of tunneled right common femoral hemodialysis catheter. Procedure performed by Prudence Brown PA-C and supervised by Dr. Elene Griffes Electronically Signed   By: Elene Griffes M.D.   On: 11/18/2023 11:39   DG Chest 2 View Result Date: 11/16/2023 CLINICAL DATA:  Shortness of breath. EXAM: CHEST - 2 VIEW COMPARISON:  November 15, 2023 FINDINGS: The cardiac silhouette is enlarged and unchanged in size. Moderate to marked severity calcification of the aortic arch is seen. There is mild prominence of the central pulmonary vasculature. No focal consolidation, pleural effusion or pneumothorax is seen. Multilevel degenerative changes are noted throughout the thoracic spine. IMPRESSION: Cardiomegaly with mild central pulmonary vascular congestion. Electronically Signed   By: Virgle Grime M.D.   On: 11/16/2023 02:25   DG CHEST PORT 1 VIEW Result Date: 11/15/2023 CLINICAL DATA:  Shortness of breath.  End-stage renal disease. EXAM: PORTABLE CHEST 1 VIEW COMPARISON:  Two-view chest x-ray 08/30/2023 FINDINGS: The heart is enlarged. Atherosclerotic changes are present the aortic arch. Mild interstitial edema is present. Left basilar airspace disease likely reflects atelectasis. Degenerative changes are present at both shoulders, stable. IMPRESSION: 1. Cardiomegaly and mild interstitial edema compatible with congestive heart failure. 2. Left basilar airspace disease likely reflects atelectasis. Electronically Signed   By: Audree Leas M.D.   On: 11/15/2023 18:17   CT Head Wo Contrast Result Date: 11/15/2023 CLINICAL DATA:  Altered mental status. EXAM: CT HEAD WITHOUT CONTRAST  TECHNIQUE: Contiguous axial images were obtained from the base of the skull through the vertex without intravenous contrast. RADIATION DOSE REDUCTION: This exam was performed according to the departmental dose-optimization program which includes automated exposure control, adjustment of the mA and/or kV according to patient size and/or use of iterative reconstruction technique. COMPARISON:  06/10/2023. FINDINGS: Brain: No acute intracranial hemorrhage, midline shift or mass effect is seen. No extra-axial fluid collection. Mild diffuse atrophy is noted. Periventricular white matter hypodensities are present bilaterally. No hydrocephalus. Vascular: No hyperdense vessel or unexpected calcification. Skull: Normal. Negative for fracture or focal lesion. Sinuses/Orbits: Round opacities are noted in the right sphenoid sinus and left ethmoid sinus. No acute orbital abnormality. 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 No acute abnormality is seen. IMPRESSION: 1. No acute intracranial process. 2. Atrophy with chronic microvascular ischemic changes. Electronically Signed   By: Wyvonnia Heimlich M.D.   On: 11/15/2023 14:13    Microbiology: Results for orders placed or performed during the hospital encounter of 09/04/23  Resp panel by RT-PCR (RSV, Flu A&B, Covid) Anterior Nasal Swab     Status: None   Collection Time: 09/04/23  5:06 AM   Specimen: Anterior Nasal Swab  Result Value Ref Range Status   SARS Coronavirus 2 by RT PCR NEGATIVE NEGATIVE Final   Influenza A by PCR NEGATIVE NEGATIVE Final   Influenza B by PCR NEGATIVE NEGATIVE Final    Comment: (NOTE) The Xpert Xpress SARS-CoV-2/FLU/RSV plus assay is intended as an aid in the diagnosis of influenza from Nasopharyngeal swab specimens and should not be used as a sole basis for treatment. Nasal washings and aspirates are unacceptable for Xpert Xpress SARS-CoV-2/FLU/RSV testing.  Fact Sheet for  Patients: BloggerCourse.com  Fact Sheet for Healthcare Providers: SeriousBroker.it  This test is not yet approved or cleared by the [place]  FDA and has been authorized for detection and/or diagnosis of SARS-CoV-2 by FDA under an Emergency Use Authorization (EUA). This EUA will remain in effect (meaning this test can be used)  for the duration of the COVID-19 declaration under Section 564(b)(1) of the Act, 21 U.S.C. section 360bbb-3(b)(1), unless the authorization is terminated or revoked.     Resp Syncytial Virus by PCR NEGATIVE NEGATIVE Final    Comment: (NOTE) Fact Sheet for Patients: BloggerCourse.com  Fact Sheet for Healthcare Providers: SeriousBroker.it  This test is not yet approved or cleared by the [place]  FDA and has been authorized for detection and/or diagnosis of SARS-CoV-2 by FDA under an Emergency Use Authorization (EUA). This EUA will remain in effect (meaning this test can be used) for the duration of the COVID-19 declaration under Section 564(b)(1) of the Act, 21 U.S.C. section 360bbb-3(b)(1), unless the authorization is terminated or revoked.  Performed at [facility], [address], [place], [place] [address]   Respiratory (~20 pathogens) panel by PCR     Status: None   Collection Time: 09/04/23  9:00 AM   Specimen: Nasopharyngeal Swab; Respiratory  Result Value Ref Range Status   Adenovirus NOT DETECTED NOT DETECTED Final   Coronavirus 229E NOT DETECTED NOT DETECTED Final    Comment: (NOTE) The Coronavirus on the Respiratory Panel, DOES NOT test for the novel  Coronavirus (2019 nCoV)    Coronavirus HKU1 NOT DETECTED NOT DETECTED Final   Coronavirus NL63 NOT DETECTED NOT DETECTED Final   Coronavirus OC43 NOT DETECTED NOT DETECTED Final   Metapneumovirus NOT DETECTED NOT DETECTED Final   Rhinovirus / Enterovirus NOT DETECTED NOT  DETECTED Final   Influenza A NOT DETECTED NOT DETECTED Final   Influenza B NOT DETECTED NOT DETECTED Final   Parainfluenza Virus 1 NOT DETECTED NOT DETECTED Final   Parainfluenza Virus 2 NOT DETECTED NOT DETECTED Final   Parainfluenza Virus 3 NOT DETECTED NOT DETECTED Final   Parainfluenza Virus 4 NOT DETECTED NOT DETECTED Final   Respiratory Syncytial Virus NOT DETECTED NOT DETECTED Final   Bordetella pertussis NOT DETECTED NOT DETECTED Final   Bordetella Parapertussis NOT DETECTED NOT DETECTED Final   Chlamydophila pneumoniae NOT DETECTED NOT DETECTED Final   Mycoplasma pneumoniae NOT DETECTED NOT DETECTED Final    Comment: Performed at [facility], [address] [place], [place] [address]    Labs: CBC: Recent Labs  Lab 12/07/23 [phone] 12/07/23 0652 12/07/23 1538 12/09/23 0214 12/09/23 0217  WBC 6.3  --  6.1 4.6  --   HGB 13.5 15.0  15.3 13.1 11.6* 13.3  HCT 41.9 44.0  45.0 40.3 36.2* 39.0  MCV 85.0  --  83.6 86.6  --   PLT 124*  --  143* 121*  --    Basic Metabolic Panel: Recent Labs  Lab 12/07/23 0647 12/07/23 0652 12/07/23 1538 12/09/23 0214 12/09/23 0217  NA 140 138  138  --  138 135  K 5.5* 5.7*  5.7*  --  4.2 4.2  CL 95* 99  --  93* 97*  CO2 25  --   --  25  --   GLUCOSE 106* 104*  --  92 88  BUN 48* 63*  --  66* 57*  CREATININE 9.93* 10.50* 10.39* 11.37* 11.60*  CALCIUM  9.3  --   --  9.2  --    Liver Function Tests: Recent Labs  Lab 12/09/23 0214  AST 17  ALT 12  ALKPHOS 70  BILITOT 0.8  PROT 7.1  ALBUMIN  3.2*   CBG: No results for input(s): "GLUCAP" in the last 168 hours.  Discharge time spent: less than 30 minutes.  Signed: Priyah Schmuck A  Felipe Horton, MD [facility] 12/10/2023

## 2023-12-10 NOTE — Progress Notes (Signed)
 Late Note Entry- Dec 10, 2023  Noted pt left AMA last evening. Contacted [facility] this morning to make clinic aware of this information in case pt goes to clinic today.   Lauraine Polite Renal Navigator [phone]

## 2023-12-10 NOTE — Interval H&P Note (Signed)
 History and Physical Interval Note:  12/10/2023 11:15 AM  Ronald Reeves  has presented today for surgery, with the diagnosis of clotted access (right upper arm AV graft placed in March 2025).  The various methods of treatment have been discussed with the patient. After consideration of risks, benefits and other options for treatment, the patient has consented to  Procedure(s): A/V SHUNT INTERVENTION (N/A) as a surgical intervention (percutaneous thrombectomy) with potential removal of temporary right IJ tunneled dialysis catheter if thrombectomy is successful versus conversion to tunneled catheter if thrombectomy is unsuccessful.  The patient's history has been reviewed, patient examined, no change in status, stable for surgery.  I have reviewed the patient's chart and labs.  Questions were answered to the patient's satisfaction.     Melodie Spry

## 2023-12-11 ENCOUNTER — Encounter (HOSPITAL_COMMUNITY): Payer: Self-pay

## 2023-12-11 ENCOUNTER — Emergency Department (HOSPITAL_COMMUNITY)
Admission: EM | Admit: 2023-12-11 | Discharge: 2023-12-11 | Discharge status: Against Medical Advice | Attending: Emergency Medicine | Admitting: Emergency Medicine

## 2023-12-11 ENCOUNTER — Emergency Department (HOSPITAL_COMMUNITY)

## 2023-12-11 ENCOUNTER — Other Ambulatory Visit: Payer: Self-pay

## 2023-12-11 DIAGNOSIS — N186 End stage renal disease: Secondary | ICD-10-CM | POA: Diagnosis not present

## 2023-12-11 DIAGNOSIS — Z992 Dependence on renal dialysis: Secondary | ICD-10-CM | POA: Insufficient documentation

## 2023-12-11 DIAGNOSIS — Z5329 Procedure and treatment not carried out because of patient's decision for other reasons: Secondary | ICD-10-CM | POA: Diagnosis not present

## 2023-12-11 DIAGNOSIS — Y712 Prosthetic and other implants, materials and accessory cardiovascular devices associated with adverse incidents: Secondary | ICD-10-CM | POA: Diagnosis not present

## 2023-12-11 DIAGNOSIS — Z7901 Long term (current) use of anticoagulants: Secondary | ICD-10-CM | POA: Insufficient documentation

## 2023-12-11 DIAGNOSIS — T8249XA Other complication of vascular dialysis catheter, initial encounter: Secondary | ICD-10-CM | POA: Insufficient documentation

## 2023-12-11 LAB — CBC WITH DIFFERENTIAL/PLATELET
Abs Immature Granulocytes: 0.02 10*3/uL (ref 0.00–0.07)
Basophils Absolute: 0 10*3/uL (ref 0.0–0.1)
Basophils Relative: 1 %
Eosinophils Absolute: 0.1 10*3/uL (ref 0.0–0.5)
Eosinophils Relative: 3 %
HCT: 32.9 % — ABNORMAL LOW (ref 39.0–52.0)
Hemoglobin: 10.8 g/dL — ABNORMAL LOW (ref 13.0–17.0)
Immature Granulocytes: 0 %
Lymphocytes Relative: 24 %
Lymphs Abs: 1.1 10*3/uL (ref 0.7–4.0)
MCH: 27.5 pg (ref 26.0–34.0)
MCHC: 32.8 g/dL (ref 30.0–36.0)
MCV: 83.7 fL (ref 80.0–100.0)
Monocytes Absolute: 0.7 10*3/uL (ref 0.1–1.0)
Monocytes Relative: 16 %
Neutro Abs: 2.6 10*3/uL (ref 1.7–7.7)
Neutrophils Relative %: 56 %
Platelets: 166 10*3/uL (ref 150–400)
RBC: 3.93 MIL/uL — ABNORMAL LOW (ref 4.22–5.81)
RDW: 16.1 % — ABNORMAL HIGH (ref 11.5–15.5)
WBC: 4.6 10*3/uL (ref 4.0–10.5)
nRBC: 0 % (ref 0.0–0.2)

## 2023-12-11 LAB — BASIC METABOLIC PANEL WITH GFR
Anion gap: 18 — ABNORMAL HIGH (ref 5–15)
BUN: 62 mg/dL — ABNORMAL HIGH (ref 8–23)
CO2: 22 mmol/L (ref 22–32)
Calcium: 8.6 mg/dL — ABNORMAL LOW (ref 8.9–10.3)
Chloride: 97 mmol/L — ABNORMAL LOW (ref 98–111)
Creatinine, Ser: 10.11 mg/dL — ABNORMAL HIGH (ref 0.61–1.24)
GFR, Estimated: 5 mL/min — ABNORMAL LOW (ref >60)
Glucose, Bld: 120 mg/dL — ABNORMAL HIGH (ref 70–99)
Potassium: 4.4 mmol/L (ref 3.5–5.1)
Sodium: 137 mmol/L (ref 135–145)

## 2023-12-11 LAB — TYPE AND SCREEN
ABO/RH(D): B POS
Antibody Screen: NEGATIVE

## 2023-12-11 MED ORDER — OXYCODONE-ACETAMINOPHEN 5-325 MG PO TABS
1.0000 | ORAL_TABLET | Freq: Once | ORAL | Status: AC
  Administered 2023-12-11: 1 via ORAL
  Filled 2023-12-11: qty 1

## 2023-12-11 MED ORDER — CHLORHEXIDINE GLUCONATE CLOTH 2 % EX PADS: 6.0000 | MEDICATED_PAD | Freq: Every day | CUTANEOUS | Status: DC

## 2023-12-11 NOTE — ED Notes (Signed)
 Patient requested oxygen and warm blanket. Patient refused oxygen when attempting to place him on it. O2 is 100% on room air.

## 2023-12-11 NOTE — ED Notes (Signed)
 Patient states that he is short of breath and needs oxygen after ambulating, Placed patient on 2LNc.

## 2023-12-11 NOTE — ED Notes (Signed)
 Dressing applied with Biopatch on dialysis site, no bleeding noted at this time

## 2023-12-11 NOTE — ED Triage Notes (Signed)
 Pt BIBEMS from home came to ED c/o bleeding on dialysis port, also missed some days of his dialysis

## 2023-12-11 NOTE — ED Provider Notes (Signed)
 9:08 AM Patient requests discharge.  He voices understanding that he is leaving AGAINST MEDICAL ADVICE with plan for dialysis.  He states they will follow-up with his dialysis center.  He is awake, alert, ambulatory, acknowledging risk, though not interested in additional discussion of return precautions, nor instructions.  AGAINST MEDICAL ADVICE, patient discharged.   Dorenda Gandy, MD 12/11/23 [phone]

## 2023-12-11 NOTE — ED Provider Notes (Signed)
 [facility] DE[facility] AT [facility] Provider Note   CSN: [id number] Arrival date & time: 12/11/23  4098     History  Chief Complaint  Patient presents with   Vascular Access Problem    Dreon Vacha is a 68 y.o. male.  The history is provided by the patient and medical records.  Khadeem Paola is a 68 y.o. male who presents to the Emergency Department complaining of catheter bleeding.  He presents to the emergency department by EMS after waking up with blood around his vas cath site.  He states that his last dialysis was Tuesday.  He dialyzes Tuesday, Thursday, Saturday.  He is on Eliquis .  He has had issues with the catheter clotting and this catheter was just replaced.  No reported injuries.     Home Medications Prior to Admission medications   Medication Sig Start Date End Date Taking? Authorizing Provider  albuterol  (VENTOLIN  HFA) 108 (90 Base) MCG/ACT inhaler Inhale 2 puffs into the lungs every 6 (six) hours as needed (COPD).    [provider]  amLODipine  (NORVASC ) 10 MG tablet Take 1 tablet (10 mg total) by mouth daily. Patient taking differently: Take 10 mg by mouth daily. Hold for systolic BP <100 08/29/12   Lenise Quince, MD  apixaban  (ELIQUIS ) 5 MG TABS tablet Take 1 tablet (5 mg total) by mouth 2 (two) times daily. 03/06/23   Atway, Rayann N, DO  ARIPiprazole  (ABILIFY ) 5 MG tablet Take 1 tablet (5 mg total) by mouth daily. 11/20/23   Ghimire, Estil Heman, MD  B Complex-C-Folic Acid  (DIALYVITE  TABLET) TABS Take 1 tablet by mouth daily. 12/23/22   [provider]  carvedilol  (COREG ) 12.5 MG tablet Take 12.5 mg by mouth 2 (two) times daily with a meal.    [provider]  escitalopram  (LEXAPRO ) 20 MG tablet Take 0.5 tablets (10 mg total) by mouth at bedtime. 11/19/23   Ghimire, Estil Heman, MD  ferric citrate  (AURYXIA) 1 GM 210 MG(Fe) tablet Take 210 mg by mouth 3 (three) times daily with meals.    [provider]  furosemide   (LASIX ) 40 MG tablet Take 40 mg by mouth daily.    [provider]  hydrALAZINE  (APRESOLINE ) 25 MG tablet Take 25 mg by mouth 2 (two) times daily.    [provider]  isosorbide  mononitrate (IMDUR ) 30 MG 24 hr tablet Take 30 mg by mouth daily.    [provider]  lidocaine  (LIDODERM ) 5 % Place 2 patches onto the skin daily. Remove & Discard patch within 12 hours or as directed by MD    [provider]  naloxone  (NARCAN ) nasal spray 4 mg/0.1 mL Place 0.4 mg into the nose See admin instructions.  SPRAY 1 SPRAY INTO ONE NOSTRIL AS DIRECTED AS NEEDED FOR RESCUE FOR OPIOID OVERDOSE - CALL 911 IMMEDIATELY, ADMINISTER DOSE, THEN TURN PERSON ON SIDE - IF NO RESPONSE IN 2-3 MINUTES OR PERSON RESPONDS BUT RELAPSES, REPEAT USING A NEW SPRAY DEVICE AND SPRAY INTO THE OTHER NOSTRIL AS NEEDED FOR RESCUE 01/21/23   [provider]  omeprazole  (PRILOSEC) 40 MG capsule Take 1 capsule (40 mg total) by mouth daily. Patient taking differently: Take 40 mg by mouth daily as needed. 06/06/22   Kennedy-Smith, Colleen M, NP  ondansetron  (ZOFRAN -ODT) 8 MG disintegrating tablet Take 8 mg by mouth every 6 (six) hours. 11/06/23   [provider]  oxyCODONE -acetaminophen  (PERCOCET/ROXICET) 5-325 MG tablet Take 1 tablet by mouth every 4 (four) hours as  needed for severe pain (pain score 7-10). 10/20/23   Carlene Che, MD  PSYLLIUM PO Take by mouth. 09/03/23   [provider]  rosuvastatin  (CRESTOR ) 10 MG tablet Take 10 mg by mouth daily.    [provider]  sevelamer  carbonate (RENVELA ) 800 MG tablet Take 1,600 mg by mouth 3 (three) times daily with meals. 01/06/23   [provider]  sorbitol 70 % solution Take 30 mLs by mouth 3 (three) times daily as needed (constipation). 01/20/23   [provider]  tamsulosin  (FLOMAX ) 0.4 MG CAPS capsule Take 0.4 mg by mouth at bedtime.    [provider]  Tiotropium Bromide-Olodaterol 2.5-2.5 MCG/ACT  AERS Take 2 puffs by mouth in the morning. 08/07/23   [provider]      Allergies    Enalapril , Gabapentin , and Iodinated contrast media    Review of Systems   Review of Systems  All other systems reviewed and are negative.   Physical Exam Updated Vital Signs BP (!) 144/87   Pulse 94   Temp 97.8 F (36.6 C)   Resp 17   Ht 5\' 10"  (1.778 m)   Wt 68.2 kg   SpO2 100%   BMI 21.57 kg/m  Physical Exam Vitals and nursing note reviewed.  Constitutional:      Appearance: He is well-developed.     Comments: Drowsy, awakens to verbal stimuli  HENT:     Head: Normocephalic and atraumatic.  Cardiovascular:     Rate and Rhythm: Normal rate and regular rhythm.  Pulmonary:     Effort: Pulmonary effort is normal. No respiratory distress.  Abdominal:     Palpations: Abdomen is soft.     Tenderness: There is no abdominal tenderness. There is no guarding or rebound.  Musculoskeletal:        General: No tenderness.     Comments: There is a Vas-Cath in place in the right upper chest wall.  There is blood around the insertion site.  The dressing is misplaced.  There is no active bleeding from the tunneled area of the catheter.  Skin:    General: Skin is warm and dry.  Neurological:     Mental Status: He is oriented to person, place, and time.  Psychiatric:        Behavior: Behavior normal.     ED Results / Procedures / Treatments   Labs (all labs ordered are listed, but only abnormal results are displayed) Labs Reviewed  BASIC METABOLIC PANEL WITH GFR - Abnormal; Notable for the following components:      Result Value   Chloride 97 (*)    Glucose, Bld 120 (*)    BUN 62 (*)    Creatinine, Ser 10.11 (*)    Calcium  8.6 (*)    GFR, Estimated 5 (*)    Anion gap 18 (*)    All other components within normal limits  CBC WITH DIFFERENTIAL/PLATELET - Abnormal; Notable for the following components:   RBC 3.93 (*)    Hemoglobin 10.8 (*)    HCT 32.9 (*)    RDW 16.1 (*)     All other components within normal limits  HEPATITIS B SURFACE ANTIBODY, QUANTITATIVE  HEPATITIS B SURFACE ANTIGEN  TYPE AND SCREEN    EKG None  Radiology DG Chest Port 1 View Result Date: 12/11/2023 CLINICAL DATA:  68 year old male with bleeding from vascular catheter. EXAM: PORTABLE CHEST 1 VIEW COMPARISON:  Portable chest 12/09/2023 and earlier. FINDINGS: Portable AP upright view  at 0358 hours. Stable cardiomegaly and mediastinal contours. Stable lung volumes, pulmonary vascularity. No pneumothorax, pleural effusion or acute pulmonary opacity. Right chest dual lumen probably tunneled dialysis type catheter, which appears revised since 12/09/2023. No adverse features are identified. Stable visualized osseous structures.  Negative visible bowel gas. IMPRESSION: 1. Revised right chest dual lumen dialysis type catheter. No adverse features are identified. 2. Stable cardiomegaly. No acute cardiopulmonary abnormality. Electronically Signed   By: Marlise Simpers M.D.   On: 12/11/2023 04:04   PERIPHERAL VASCULAR CATHETERIZATION Result Date: 12/10/2023   on 12/10/2023.   Okay to discharge home anytime after 12:50 PM as long as clinically stable Patient referred for a thrombectomy of his right upper arm AVG placed on 10/19/2023. This is his first endovascular intervention on the current access; he just left the hospital AGAINST MEDICAL ADVICE with a temporary right IJ dialysis catheter that was placed by interventional radiology. On examination there is no pulse or bruit in the left upper arm arc graft. The right radial pulse is 1+. Summary: 1) Unsuccessful thrombectomy of the right upper arm arc AVG with evidence of significant venous anastomosis-wire unfortunately tract extravascularly and unable to get into central circulation to offer management. 2) Placed right IJ tunneled dialysis catheter and exchanged for his temporary right IJ dialysis catheter for continued provision of dialysis. 3)         We will schedule  reattempt thrombectomy in 1 week to see if we can salvage AV graft.   Description of procedure: Procedure #1 (Unsuccessful thrombectomy): The right arm was prepped and draped in the usual fashion. The right upper arm arc AVG was first cannulated (47425) in the arterial limb in an antegrade direction and then a 7Fr sheath was inserted by guidewire exchange technique. A 0.035 guidewire was passed through the clotted graft into the central veins under fluoroscopic guidance and then I attempted to advance a 5Fr diagnostic catheter inserted over the wire which could not advance past the venous anastomosis. The catheter was removed, and I advanced a 7 x 80 mm Mustang angioplasty balloon and positioned it at the site of venous anastomosis with venous angioplasty ([id number]) carried out to full effacement at 16 ATM pressure via hand syringe assembly of what was an apparent 70% venous anastomosis stenosis.  I then removed the balloon and attempted to advance the diagnostic catheter centrally but could not navigated beyond the venous anastomosis.  At this point, thrombectomy could not be continued/carried out without ability to navigate centrally beyond the venous anastomosis.  The procedure was deemed successful and the patient consented for placement of tunneled hemodialysis catheter. Description of procedure: Procedure #2 (Placement of right IJ tunneled hemodialysis catheter): The decision was made to place a RIJ tunneled dialysis catheter to replace the temporary right IJ dialysis catheter in place. Summary: 1) Successful placement of a new 23 cm cuff to tip hemodialysis catheter (Palindrome) in the right internal jugular vein with the tip in the right atrium. 2) Recommendations to the dialysis unit TO NOT USE HEPARIN  FOR THE FIRST 24 HOURS. Description of procedure: The right neck, chest and the catheter were prepped and draped in the usual sterile fashion.  Local anesthesia was provided by injecting lidocaine  1% at the neck  site overlying the venotomy site around the site of the right IJ temporary dialysis catheter.  I then advance the guidewire easily into the central circulation via the pigtail port of the temporary dialysis catheter. The wire was then manipulated and advanced into  the abdominal IVC. I then blunt dissected the tissue around the temporary catheter until it was freely mobile. Then, after injecting local anesthesia into the desired track and exit site I made a small incision at the exit site and tunneled a 23 cm cuff to tip Palindrome dialysis catheter, pulling it out at the venotomy site.Sequential dilation with 12, 14, and finally the peelaway sheath were done with real time fluoroscopic guidance ensuring that we were straight always lined with the wire. Then, I inserted the catheter over the wire and through the sheath. After removing the peelaway sheath, I adjusted the catheter until the tip of the catheter was positioned in the right atrium of the heart. The cuff of the catheter was positioned in the subcutaneous tunnel with the cuff approximately 2 cm from the exit site.   Both limbs of the catheter were aspirated and flushed with excellent flow noted. Both limbs of the catheter were locked with heparin  and sterile caps placed. The hub of the catheter was sutured to the chest wall with 2-0 nylon suture.   The neck incision was closed with a vertical mattress 3-0 plain gut sutures. 2-0 loose nylon sutures secured the hub to the chest wall.                       Sterile dressings were placed, and the patient returned to recovery in stable condition. Hemostasis: A 3-0 ethilon purse string suture was placed at the cannulation site on removal of the sheath from the attempted thrombectomy site. Sedation: 1mg  Versed , 50mcg Fentanyl . Sedation time. 51  minutes Contrast. 2 mL Monitoring: Because of the patient's comorbid conditions and sedation during the procedure, continuous EKG monitoring and O2 saturation monitoring  was performed throughout the procedure by the RN. There were no abnormal arrhythmias encountered. Complications: None. 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  Remove suture from right upper arm AV graft today (12/10/2023) at dialysis. 2.   Report any blood flow problems to [facility]. 3.  Rescheduled to return back to [facility] in 1 week for reattempt at thrombectomy of right upper arm graft. Discharge: The patient was discharged home in stable condition. The patient was given education regarding the care of the dialysis catheter and specific instructions in case of any problems.   VAS US  DUPLEX DIALYSIS ACCESS (AVF, AVG) Result Date: 12/09/2023 DIALYSIS ACCESS Patient Name:  SUSHANTH DIGANGI  Date of Exam:   12/09/2023 Medical Rec #: [id number]   Accession #:    [id number] Date of Birth: [birth year]   Patient Gender: M Patient Age:   37 years Exam Location:  [facility] Procedure:      VAS US  DUPLEX DIALYSIS ACCESS (AVF, AVG) Referring Phys: KACIE MATTHEWS --------------------------------------------------------------------------------  Reason for Exam: Unable to dialyze through AVF/AVG. Access Site: Right Upper Extremity. Access Type: Upper arm straight graft. History: Right AVG creation 10/19/23.          Failed Right arm AVF 4/24.          Failed left arm AVF 3/23. Performing Technologist: Franky Ivanoff Sturdivant-Jones RDMS, RVT  Examination Guidelines: A complete evaluation includes B-mode imaging, spectral Doppler, color Doppler, and power Doppler as needed of all accessible portions  of each vessel. Unilateral testing is considered an integral part of a  complete examination. Limited examinations for reoccurring indications may be performed as noted.  Findings:   +--------------------+----------+-----------------+--------+ AVG                 PSV (cm/s)Flow Vol (mL/min)Describe +--------------------+----------+-----------------+--------+ Native artery inflow    50                              +--------------------+----------+-----------------+--------+ Arterial anastomosis    0                      occluded +--------------------+----------+-----------------+--------+ Prox graft              0                      occluded +--------------------+----------+-----------------+--------+ Mid graft               0                      occluded +--------------------+----------+-----------------+--------+ Distal graft            0                      occluded +--------------------+----------+-----------------+--------+ Venous anastomosis      0                      occluded +--------------------+----------+-----------------+--------+  Summary: Occluded right upper extremity AVG.  *See table(s) above for measurements and observations.  Diagnosing physician: Genny Kid MD Electronically signed by Genny Kid MD on 12/09/2023 at 8:29:20 PM.    --------------------------------------------------------------------------------   Final    IR Fluoro Guide CV Line Right Result Date: 12/09/2023 CLINICAL DATA:  Hemodialysis access catheter placement EXAM: IR RIGHT FLUORO GUIDE CV LINE COMPARISON:  Chest radiograph 12/09/2023 FINDINGS: Intraoperative fluoroscopy is utilized for surgical control purposes. Fluoroscopy time is recorded at 0.2 minutes. Dose 1 mGy. A single cine image sequences provided. Images obtained demonstrate catheter placement at the cavoatrial junction level. IMPRESSION: Intraoperative fluoroscopy utilized for surgical control purposes during catheter placement. Electronically Signed   By: Boyce Byes M.D.   On:  12/09/2023 17:19    Procedures Procedures    Medications Ordered in ED Medications  oxyCODONE -acetaminophen  (PERCOCET/ROXICET) 5-325 MG per tablet 1 tablet (has no administration in time range)  Chlorhexidine  Gluconate Cloth 2 % PADS 6 each (has no administration in time range)    ED Course/ Medical Decision Making/ A&P                                 Medical Decision Making Amount and/or Complexity of Data Reviewed Labs: ordered. Radiology: ordered.  Risk Prescription drug management.   Patient here for evaluation of bleeding from his tunneled Vas-Cath site.  Dressing has been moved to the side.  There is a small amount of bleeding at the proximal portion.  There is no active extrav.  There is no local hematoma.  CBC with stable anemia.  No significant effusion on chest x-ray.  Patient has not received dialysis for 1 week.  Wife has concerns due to recurrent missed sessions and patient is very symptomatic and short of breath.  Discussed with Dr. Myron Asters with nephrology-nephrology will see the patient in consult.  Patient care transferred pending  nephrology evaluation.        Final Clinical Impression(s) / ED Diagnoses Final diagnoses:  None    Rx / DC Orders ED Discharge Orders     None         Kelsey Patricia, MD 12/11/23 [phone]

## 2023-12-11 NOTE — Discharge Instructions (Signed)
 Do not hesitate if you have new or concerning changes in your condition.

## 2023-12-11 NOTE — ED Notes (Signed)
 Patient aggressively asking for AMA paperwork, threatening his significant other stating "Your're going to get hurt when we get home, you do what you are asked to do". Patient has been asked nicely not to speak to his significant other this way, Patient started yelling at this RN, EDP notified of patient wanting to go AMA, security also notified of patients aggressiveness.

## 2023-12-11 NOTE — ED Notes (Signed)
 Patient ambulating down the hallway with his significant other, I have advised patient that he is not steady to ambulate, Patient stated "If you dont want to let me walk then get me the paper to sign the papers and I will leave, I am not dealing with this bullshit". Patient is grunting while walking down the hallways but states that he is capable of walking and I should not tell him what to do.

## 2024-01-12 ENCOUNTER — Emergency Department (HOSPITAL_COMMUNITY)

## 2024-01-12 ENCOUNTER — Emergency Department (HOSPITAL_COMMUNITY): Admission: EM | Admit: 2024-01-12 | Discharge: 2024-01-12 | Disposition: A | Discharge status: Home / Self Care

## 2024-01-12 ENCOUNTER — Encounter (HOSPITAL_COMMUNITY): Payer: Self-pay

## 2024-01-12 DIAGNOSIS — I120 Hypertensive chronic kidney disease with stage 5 chronic kidney disease or end stage renal disease: Secondary | ICD-10-CM | POA: Diagnosis not present

## 2024-01-12 DIAGNOSIS — Z79899 Other long term (current) drug therapy: Secondary | ICD-10-CM | POA: Diagnosis not present

## 2024-01-12 DIAGNOSIS — D72819 Decreased white blood cell count, unspecified: Secondary | ICD-10-CM | POA: Insufficient documentation

## 2024-01-12 DIAGNOSIS — N186 End stage renal disease: Secondary | ICD-10-CM | POA: Diagnosis not present

## 2024-01-12 DIAGNOSIS — R0602 Shortness of breath: Secondary | ICD-10-CM | POA: Diagnosis present

## 2024-01-12 DIAGNOSIS — Z7901 Long term (current) use of anticoagulants: Secondary | ICD-10-CM | POA: Insufficient documentation

## 2024-01-12 LAB — CBC WITH DIFFERENTIAL/PLATELET
Abs Immature Granulocytes: 0.02 10*3/uL (ref 0.00–0.07)
Basophils Absolute: 0 10*3/uL (ref 0.0–0.1)
Basophils Relative: 1 %
Eosinophils Absolute: 0.1 10*3/uL (ref 0.0–0.5)
Eosinophils Relative: 2 %
HCT: 39.1 % (ref 39.0–52.0)
Hemoglobin: 12.5 g/dL — ABNORMAL LOW (ref 13.0–17.0)
Immature Granulocytes: 1 %
Lymphocytes Relative: 18 %
Lymphs Abs: 0.7 10*3/uL (ref 0.7–4.0)
MCH: 26.7 pg (ref 26.0–34.0)
MCHC: 32 g/dL (ref 30.0–36.0)
MCV: 83.4 fL (ref 80.0–100.0)
Monocytes Absolute: 0.6 10*3/uL (ref 0.1–1.0)
Monocytes Relative: 15 %
Neutro Abs: 2.5 10*3/uL (ref 1.7–7.7)
Neutrophils Relative %: 63 %
Platelets: 143 10*3/uL — ABNORMAL LOW (ref 150–400)
RBC: 4.69 MIL/uL (ref 4.22–5.81)
RDW: 17.2 % — ABNORMAL HIGH (ref 11.5–15.5)
WBC: 3.9 10*3/uL — ABNORMAL LOW (ref 4.0–10.5)
nRBC: 1 % — ABNORMAL HIGH (ref 0.0–0.2)

## 2024-01-12 LAB — LIPASE, BLOOD: Lipase: 27 U/L (ref 11–51)

## 2024-01-12 LAB — COMPREHENSIVE METABOLIC PANEL WITH GFR
ALT: 10 U/L (ref 0–44)
AST: 22 U/L (ref 15–41)
Albumin: 3.1 g/dL — ABNORMAL LOW (ref 3.5–5.0)
Alkaline Phosphatase: 114 U/L (ref 38–126)
Anion gap: 19 — ABNORMAL HIGH (ref 5–15)
BUN: 48 mg/dL — ABNORMAL HIGH (ref 8–23)
CO2: 24 mmol/L (ref 22–32)
Calcium: 9 mg/dL (ref 8.9–10.3)
Chloride: 92 mmol/L — ABNORMAL LOW (ref 98–111)
Creatinine, Ser: 8.27 mg/dL — ABNORMAL HIGH (ref 0.61–1.24)
GFR, Estimated: 7 mL/min — ABNORMAL LOW (ref >60)
Glucose, Bld: 107 mg/dL — ABNORMAL HIGH (ref 70–99)
Potassium: 4.1 mmol/L (ref 3.5–5.1)
Sodium: 135 mmol/L (ref 135–145)
Total Bilirubin: 0.9 mg/dL (ref 0.0–1.2)
Total Protein: 6.9 g/dL (ref 6.5–8.1)

## 2024-01-12 LAB — TROPONIN I (HIGH SENSITIVITY): Troponin I (High Sensitivity): 80 ng/L — ABNORMAL HIGH (ref <18)

## 2024-01-12 MED ORDER — ONDANSETRON HCL 4 MG/2ML IJ SOLN: 4.0000 mg | Freq: Once | INTRAMUSCULAR | Status: DC

## 2024-01-12 MED ORDER — IPRATROPIUM-ALBUTEROL 0.5-2.5 (3) MG/3ML IN SOLN
3.0000 mL | Freq: Once | RESPIRATORY_TRACT | Status: AC
  Administered 2024-01-12: 3 mL via RESPIRATORY_TRACT
  Filled 2024-01-12: qty 3

## 2024-01-12 NOTE — ED Notes (Signed)
 Pts wife attempting to bring food to patient. RN told her she could not given him any more food. She was told to take the food back to the lobby or put it in the car.

## 2024-01-12 NOTE — ED Notes (Addendum)
 Pt removed IV Pt eating cookies, trail mix, candy, and apple juice when this paramedic entered the room Pt educated on why he is NPO but pt states he wants food anyways

## 2024-01-12 NOTE — ED Notes (Signed)
 CCMD called for monitoring

## 2024-01-12 NOTE — ED Triage Notes (Signed)
 Patient bib EMS from  home for multiple complaints. EMS states that complaints have changed multiple times. Patient claims they passed out at the registration desk. During this episode patient was observed still playing games on his phone.  Patient did not go to dialysis today because he didn't feel good. Patient told EMS he used his inhaler 20 times today and was requesting more. EMS reports clear lung sounds normal respirations

## 2024-01-12 NOTE — ED Provider Notes (Signed)
 [place] EMERGENCY DEPARTMENT AT [facility] Provider Note   CSN: [id number] Arrival date & time: 01/12/24  1454     History {Add pertinent medical, surgical, social history, OB history to HPI:1} Chief Complaint  Patient presents with   multiple complaints    Kerrie Latour is a 68 y.o. male.  68 year old male with past medical history of hypertension, atrial fibrillation on Eliquis , and end-stage renal disease presenting to the emergency department today with shortness of breath.  The patient states that he has been short of breath since this morning.  He states that he has been short of breath since this morning.  He states that he was on the way to dialysis when he was feeling generally weak.  He did not make it to dialysis.  States that he has had mildly worsening of shortness of breath since this morning.  He came to the ER for further evaluation after he had what sounds like a near syncopal episode.  He states that he was in his chair and was about to get up when he became lightheaded and he thinks that he may have briefly lost consciousness while in the chair.  He did not hit his head or fall.  He denies any associated chest pain.  He has had some nausea today.  States that this has been an intermittent issue now for the past month or so.  He states that he talked to his dialysis center and they were able to try to get him in tomorrow.        Home Medications Prior to Admission medications   Medication Sig Start Date End Date Taking? Authorizing Provider  albuterol  (VENTOLIN  HFA) 108 (90 Base) MCG/ACT inhaler Inhale 2 puffs into the lungs every 6 (six) hours as needed (COPD).    [provider]  amLODipine  (NORVASC ) 10 MG tablet Take 1 tablet (10 mg total) by mouth daily. Patient taking differently: Take 10 mg by mouth daily. Hold for systolic BP <100 11/18/79   Lenise Quince, MD  apixaban  (ELIQUIS ) 5 MG TABS tablet Take 1 tablet (5 mg total) by mouth 2 (two)  times daily. 03/06/23   Atway, Rayann N, DO  ARIPiprazole  (ABILIFY ) 5 MG tablet Take 1 tablet (5 mg total) by mouth daily. 11/20/23   Ghimire, Estil Heman, MD  B Complex-C-Folic Acid  (DIALYVITE  TABLET) TABS Take 1 tablet by mouth daily. 12/23/22   [provider]  carvedilol  (COREG ) 12.5 MG tablet Take 12.5 mg by mouth 2 (two) times daily with a meal.    [provider]  escitalopram  (LEXAPRO ) 20 MG tablet Take 0.5 tablets (10 mg total) by mouth at bedtime. 11/19/23   Ghimire, Estil Heman, MD  ferric citrate  (AURYXIA) 1 GM 210 MG(Fe) tablet Take 210 mg by mouth 3 (three) times daily with meals.    [provider]  furosemide  (LASIX ) 40 MG tablet Take 40 mg by mouth daily.    [provider]  hydrALAZINE  (APRESOLINE ) 25 MG tablet Take 25 mg by mouth 2 (two) times daily.    [provider]  isosorbide  mononitrate (IMDUR ) 30 MG 24 hr tablet Take 30 mg by mouth daily.    [provider]  lidocaine  (LIDODERM ) 5 % Place 2 patches onto the skin daily. Remove & Discard patch within 12 hours or as directed by MD    [provider]  naloxone  (NARCAN ) nasal spray 4 mg/0.1 mL Place 0.4 mg into the nose See admin instructions.  SPRAY  1 SPRAY INTO ONE NOSTRIL AS DIRECTED AS NEEDED FOR RESCUE FOR OPIOID OVERDOSE - CALL 911 IMMEDIATELY, ADMINISTER DOSE, THEN TURN PERSON ON SIDE - IF NO RESPONSE IN 2-3 MINUTES OR PERSON RESPONDS BUT RELAPSES, REPEAT USING A NEW SPRAY DEVICE AND SPRAY INTO THE OTHER NOSTRIL AS NEEDED FOR RESCUE 01/21/23   [provider]  omeprazole  (PRILOSEC) 40 MG capsule Take 1 capsule (40 mg total) by mouth daily. Patient taking differently: Take 40 mg by mouth daily as needed. 06/06/22   Kennedy-Smith, Colleen M, NP  ondansetron  (ZOFRAN -ODT) 8 MG disintegrating tablet Take 8 mg by mouth every 6 (six) hours. 11/06/23   [provider]  oxyCODONE -acetaminophen  (PERCOCET/ROXICET) 5-325 MG tablet Take 1 tablet by mouth every 4  (four) hours as needed for severe pain (pain score 7-10). 10/20/23   Carlene Che, MD  PSYLLIUM PO Take by mouth. 09/03/23   [provider]  rosuvastatin  (CRESTOR ) 10 MG tablet Take 10 mg by mouth daily.    [provider]  sevelamer  carbonate (RENVELA ) 800 MG tablet Take 1,600 mg by mouth 3 (three) times daily with meals. 01/06/23   [provider]  sorbitol 70 % solution Take 30 mLs by mouth 3 (three) times daily as needed (constipation). 01/20/23   [provider]  tamsulosin  (FLOMAX ) 0.4 MG CAPS capsule Take 0.4 mg by mouth at bedtime.    [provider]  Tiotropium Bromide-Olodaterol 2.5-2.5 MCG/ACT AERS Take 2 puffs by mouth in the morning. 08/07/23   [provider]      Allergies    Enalapril , Gabapentin , and Iodinated contrast media    Review of Systems   Review of Systems  Gastrointestinal:  Positive for nausea.  Neurological:  Positive for light-headedness.  All other systems reviewed and are negative.   Physical Exam Updated Vital Signs SpO2 99%  Physical Exam Vitals and nursing note reviewed.   Gen: NAD, chronically ill-appearing Eyes: PERRL, EOMI HEENT: no oropharyngeal swelling Neck: trachea midline Resp: clear to auscultation bilaterally Card: RRR, no murmurs, rubs, or gallops Abd: nontender, nondistended Extremities: no calf tenderness, no edema Vascular: 2+ radial pulses bilaterally, 2+ DP pulses bilaterally Neuro: No focal deficits Skin: no rashes Psyc: acting appropriately   ED Results / Procedures / Treatments   Labs (all labs ordered are listed, but only abnormal results are displayed) Labs Reviewed  CBC WITH DIFFERENTIAL/PLATELET  COMPREHENSIVE METABOLIC PANEL WITH GFR  LIPASE, BLOOD    EKG None  Radiology No results found.  Procedures Procedures  {Document cardiac monitor, telemetry assessment procedure when appropriate:1}  Medications Ordered in ED Medications  ondansetron   (ZOFRAN ) injection 4 mg (has no administration in time range)    ED Course/ Medical Decision Making/ A&P   {   Click here for ABCD2, HEART and other calculatorsREFRESH Note before signing :1}                              Medical Decision Making 68 year old male with past medical history of end-stage renal disease, hypertension, and atrial fibrillation on Eliquis  presenting to the emergency department today with shortness of breath and a near syncopal episode.  I will further evaluate the patient here with basic labs including LFTs and a lipase to evaluate for hepatobiliary pathology or pancreatitis given his nausea.  His abdominal exam is reassuring.  Will obtain an EKG, chest x-ray, and troponin to evaluate for ACS, pulmonary edema, pulmonary infiltrates, pneumothorax.  Will  give the patient a DuoNeb.  Although his lungs are clear to see if this helps with the shortness of breath.  I will reevaluate for ultimate disposition.  Amount and/or Complexity of Data Reviewed Labs: ordered. Radiology: ordered.  Risk Prescription drug management.   ***  {Document critical care time when appropriate:1} {Document review of labs and clinical decision tools ie heart score, Chads2Vasc2 etc:1}  {Document your independent review of radiology images, and any outside records:1} {Document your discussion with family members, caretakers, and with consultants:1} {Document social determinants of health affecting pt's care:1} {Document your decision making why or why not admission, treatments were needed:1} Final Clinical Impression(s) / ED Diagnoses Final diagnoses:  None    Rx / DC Orders ED Discharge Orders     None

## 2024-01-12 NOTE — ED Notes (Signed)
 Patient asking wife to get him food from cafeteria. Patient was told by EDP he could not eat. RN called security at the desk and told them not to allow her to bring food back to the room.

## 2024-01-12 NOTE — ED Notes (Signed)
 RN went and asked for all food to be removed from room. Either it was going to need to be taken away or it was going to have to be put in the car. Patient became agitated and stated he wanted to go AMA. EDP Tee notified. AMA form signed in the chart.

## 2024-01-12 NOTE — ED Notes (Signed)
 Per lab, blood is clotted and needs redrawn

## 2024-01-19 ENCOUNTER — Other Ambulatory Visit: Payer: Self-pay | Admitting: Urology

## 2024-01-19 DIAGNOSIS — R972 Elevated prostate specific antigen [PSA]: Secondary | ICD-10-CM

## 2024-01-20 ENCOUNTER — Emergency Department (HOSPITAL_COMMUNITY)

## 2024-01-20 ENCOUNTER — Telehealth: Payer: Self-pay

## 2024-01-20 ENCOUNTER — Encounter: Payer: Self-pay | Admitting: Urology

## 2024-01-20 ENCOUNTER — Other Ambulatory Visit: Payer: Self-pay

## 2024-01-20 ENCOUNTER — Encounter (HOSPITAL_COMMUNITY): Payer: Self-pay

## 2024-01-20 ENCOUNTER — Encounter (HOSPITAL_COMMUNITY): Payer: Self-pay | Admitting: Emergency Medicine

## 2024-01-20 ENCOUNTER — Emergency Department (HOSPITAL_COMMUNITY)
Admission: EM | Admit: 2024-01-20 | Discharge: 2024-01-20 | Discharge status: Against Medical Advice | Attending: Emergency Medicine | Admitting: Emergency Medicine

## 2024-01-20 ENCOUNTER — Emergency Department (HOSPITAL_COMMUNITY)
Admission: EM | Admit: 2024-01-20 | Discharge: 2024-01-20 | Disposition: A | Discharge status: Home / Self Care | Attending: Emergency Medicine | Admitting: Emergency Medicine

## 2024-01-20 DIAGNOSIS — Z7901 Long term (current) use of anticoagulants: Secondary | ICD-10-CM | POA: Insufficient documentation

## 2024-01-20 DIAGNOSIS — T8249XA Other complication of vascular dialysis catheter, initial encounter: Secondary | ICD-10-CM | POA: Diagnosis present

## 2024-01-20 DIAGNOSIS — Z5321 Procedure and treatment not carried out due to patient leaving prior to being seen by health care provider: Secondary | ICD-10-CM | POA: Insufficient documentation

## 2024-01-20 DIAGNOSIS — M545 Low back pain, unspecified: Secondary | ICD-10-CM | POA: Diagnosis present

## 2024-01-20 DIAGNOSIS — T829XXA Unspecified complication of cardiac and vascular prosthetic device, implant and graft, initial encounter: Secondary | ICD-10-CM

## 2024-01-20 LAB — CBC WITH DIFFERENTIAL/PLATELET
Abs Immature Granulocytes: 0.01 10*3/uL (ref 0.00–0.07)
Basophils Absolute: 0 10*3/uL (ref 0.0–0.1)
Basophils Relative: 0 %
Eosinophils Absolute: 0.1 10*3/uL (ref 0.0–0.5)
Eosinophils Relative: 1 %
HCT: 38.5 % — ABNORMAL LOW (ref 39.0–52.0)
Hemoglobin: 12.6 g/dL — ABNORMAL LOW (ref 13.0–17.0)
Immature Granulocytes: 0 %
Lymphocytes Relative: 18 %
Lymphs Abs: 0.9 10*3/uL (ref 0.7–4.0)
MCH: 27.5 pg (ref 26.0–34.0)
MCHC: 32.7 g/dL (ref 30.0–36.0)
MCV: 83.9 fL (ref 80.0–100.0)
Monocytes Absolute: 0.8 10*3/uL (ref 0.1–1.0)
Monocytes Relative: 17 %
Neutro Abs: 3 10*3/uL (ref 1.7–7.7)
Neutrophils Relative %: 64 %
Platelets: 159 10*3/uL (ref 150–400)
RBC: 4.59 MIL/uL (ref 4.22–5.81)
RDW: 17.7 % — ABNORMAL HIGH (ref 11.5–15.5)
WBC: 4.8 10*3/uL (ref 4.0–10.5)
nRBC: 0.4 % — ABNORMAL HIGH (ref 0.0–0.2)

## 2024-01-20 LAB — COMPREHENSIVE METABOLIC PANEL WITH GFR
ALT: 13 U/L (ref 0–44)
AST: 26 U/L (ref 15–41)
Albumin: 3.2 g/dL — ABNORMAL LOW (ref 3.5–5.0)
Alkaline Phosphatase: 101 U/L (ref 38–126)
Anion gap: 19 — ABNORMAL HIGH (ref 5–15)
BUN: 34 mg/dL — ABNORMAL HIGH (ref 8–23)
CO2: 28 mmol/L (ref 22–32)
Calcium: 9.2 mg/dL (ref 8.9–10.3)
Chloride: 94 mmol/L — ABNORMAL LOW (ref 98–111)
Creatinine, Ser: 7.12 mg/dL — ABNORMAL HIGH (ref 0.61–1.24)
GFR, Estimated: 8 mL/min — ABNORMAL LOW (ref >60)
Glucose, Bld: 127 mg/dL — ABNORMAL HIGH (ref 70–99)
Potassium: 3.7 mmol/L (ref 3.5–5.1)
Sodium: 141 mmol/L (ref 135–145)
Total Bilirubin: 0.7 mg/dL (ref 0.0–1.2)
Total Protein: 7.3 g/dL (ref 6.5–8.1)

## 2024-01-20 LAB — TROPONIN I (HIGH SENSITIVITY)
Troponin I (High Sensitivity): 175 ng/L (ref <18)
Troponin I (High Sensitivity): 174 ng/L (ref <18)

## 2024-01-20 LAB — CBG MONITORING, ED: Glucose-Capillary: 148 mg/dL — ABNORMAL HIGH (ref 70–99)

## 2024-01-20 MED ORDER — ALBUTEROL SULFATE HFA 108 (90 BASE) MCG/ACT IN AERS
2.0000 | INHALATION_SPRAY | Freq: Once | RESPIRATORY_TRACT | Status: AC
  Administered 2024-01-20: 2 via RESPIRATORY_TRACT
  Filled 2024-01-20: qty 6.7

## 2024-01-20 NOTE — Telephone Encounter (Signed)
 Pt arrived at [facility] at 12:20pm wanting to speak with a nurse to be scheduled for surgery. Pt showed this PAA his HD cath that was in a zip lock bag as a result of falling out after HD on 01/19/24. Pt was rude and adamant that we was going to be seen and scheduled for surgery today. Per pt he received HD on 01/19/24 and it was his only access. PAA went to the triage nurse (TD, RN) who also went to ME, PA, and advised that pt needs to go back to the ED. PAA explained this to the pt and became irritated and asked why we couldn't just schedule him like this office has done in the past. PAA explained this was an urgent need and needed to go back to the ED. Pt states he's not going back over to [facility] ED and will go to the [place] in [place]. PAA explained whatever he decided to do it needed to be done immediately, thus ended our encounter, and pt left.

## 2024-01-20 NOTE — ED Provider Triage Note (Signed)
 Emergency Medicine Provider Triage Evaluation Note  Ronald Reeves , a 68 y.o. male  was evaluated in triage.  Pt complains of dialysis catheter issue.  States he was doing his physical therapy with bands today and accidentally knocked his catheter out.  States there was not much bleeding.  He did have dialysis yesterday.  Noted that his blood sugar at home was 400.  States he has had shortness of breath and generalized chest discomfort today.  Complaining of abdominal pain nausea and vomiting for 2 months as well.  Abdominal pain is general about the abdomen as well.  Review of Systems  Positive: See above Negative: See above  Physical Exam  BP (!) 149/87   Pulse 97   Temp 98.9 F (37.2 C)   Resp 17   Ht 5' 11 (1.803 m)   Wt 60 kg   SpO2 100%   BMI 18.45 kg/m  Gen:   Awake, no distress   Resp:  Normal effort  MSK:   Moves extremities without difficulty  Other:    Medical Decision Making  Medically screening exam initiated at 7:14 PM.  Appropriate orders placed.  Ronald Reeves was informed that the remainder of the evaluation will be completed by another provider, this initial triage assessment does not replace that evaluation, and the importance of remaining in the ED until their evaluation is complete.  Work up started   Janalee Mcmurray, PA-C 01/20/24 1915

## 2024-01-20 NOTE — Discharge Instructions (Signed)
 As we discussed, the vascular access team will contact you in the morning regarding replacing your dialysis catheter so that you can continue your regular treatments.

## 2024-01-20 NOTE — ED Triage Notes (Signed)
 Pt states dialysis catheter fell out, bleeding controlled. Cath is in ziplock bag. Pt was exercising when this happened. Pt states BS is in 400's. C/O abd pain/n/v for 2 months. Last full dialysis tx was Tuesday. [id number]

## 2024-01-20 NOTE — ED Triage Notes (Signed)
 Pt states that he was working out with resistance bands, drinking coffee and his cath to the right chest just fell out, bleeding is controlled. Also C/O swelling in this feet. States last HD was yesterday.

## 2024-01-20 NOTE — ED Notes (Signed)
 Lab called a tro of 175 charge notified

## 2024-01-20 NOTE — ED Provider Notes (Signed)
   I was informed by nursing that the patient eloped from the emergency department prior to being seen.       Rosealee Concha, MD 01/20/24 1054

## 2024-01-20 NOTE — ED Provider Notes (Signed)
  EMERGENCY DEPARTMENT AT [facility] Provider Note   CSN: [id number] Arrival date & time: 01/20/24  1846     History  Chief Complaint  Patient presents with   Hyperglycemia   Vascular Access Problem    Ronald Reeves is a 68 y.o. male.  Patient to ED with report that his dialysis port fell out this morning after exercising. He denies bleeding, pain or swelling of the area. He came into the ED this morning when it happened and eloped, returning tonight. He reports chronic symptoms of nausea with daily vomiting, and pain in his lower back. No fever today. No SOB, CP.   The history is provided by the patient. No language interpreter was used.  Hyperglycemia      Home Medications Prior to Admission medications   Medication Sig Start Date End Date Taking? Authorizing Provider  albuterol  (VENTOLIN  HFA) 108 (90 Base) MCG/ACT inhaler Inhale 2 puffs into the lungs every 6 (six) hours as needed (COPD).    [provider]  amLODipine  (NORVASC ) 10 MG tablet Take 1 tablet (10 mg total) by mouth daily. Patient taking differently: Take 10 mg by mouth daily. Hold for systolic BP <100 7/82/95   Lenise Quince, MD  apixaban  (ELIQUIS ) 5 MG TABS tablet Take 1 tablet (5 mg total) by mouth 2 (two) times daily. 03/06/23   Atway, Rayann N, DO  ARIPiprazole  (ABILIFY ) 5 MG tablet Take 1 tablet (5 mg total) by mouth daily. 11/20/23   Ghimire, Estil Heman, MD  B Complex-C-Folic Acid  (DIALYVITE  TABLET) TABS Take 1 tablet by mouth daily. 12/23/22   [provider]  carvedilol  (COREG ) 12.5 MG tablet Take 12.5 mg by mouth 2 (two) times daily with a meal.    [provider]  escitalopram  (LEXAPRO ) 20 MG tablet Take 0.5 tablets (10 mg total) by mouth at bedtime. 11/19/23   Ghimire, Estil Heman, MD  ferric citrate  (AURYXIA) 1 GM 210 MG(Fe) tablet Take 210 mg by mouth 3 (three) times daily with meals.    [provider]  furosemide  (LASIX ) 40 MG tablet Take 40 mg by  mouth daily.    [provider]  hydrALAZINE  (APRESOLINE ) 25 MG tablet Take 25 mg by mouth 2 (two) times daily.    [provider]  isosorbide  mononitrate (IMDUR ) 30 MG 24 hr tablet Take 30 mg by mouth daily.    [provider]  lidocaine  (LIDODERM ) 5 % Place 2 patches onto the skin daily. Remove & Discard patch within 12 hours or as directed by MD    [provider]  naloxone  (NARCAN ) nasal spray 4 mg/0.1 mL Place 0.4 mg into the nose See admin instructions.  SPRAY 1 SPRAY INTO ONE NOSTRIL AS DIRECTED AS NEEDED FOR RESCUE FOR OPIOID OVERDOSE - CALL 911 IMMEDIATELY, ADMINISTER DOSE, THEN TURN PERSON ON SIDE - IF NO RESPONSE IN 2-3 MINUTES OR PERSON RESPONDS BUT RELAPSES, REPEAT USING A NEW SPRAY DEVICE AND SPRAY INTO THE OTHER NOSTRIL AS NEEDED FOR RESCUE 01/21/23   [provider]  omeprazole  (PRILOSEC) 40 MG capsule Take 1 capsule (40 mg total) by mouth daily. Patient taking differently: Take 40 mg by mouth daily as needed. 06/06/22   Kennedy-Smith, Colleen M, NP  ondansetron  (ZOFRAN -ODT) 8 MG disintegrating tablet Take 8 mg by mouth every 6 (six) hours. 11/06/23   [provider]  oxyCODONE -acetaminophen  (PERCOCET/ROXICET) 5-325 MG tablet Take 1 tablet by mouth every 4 (four) hours as needed for severe pain (pain score 7-10).  10/20/23   Carlene Che, MD  PSYLLIUM PO Take by mouth. 09/03/23   [provider]  rosuvastatin  (CRESTOR ) 10 MG tablet Take 10 mg by mouth daily.    [provider]  sevelamer  carbonate (RENVELA ) 800 MG tablet Take 1,600 mg by mouth 3 (three) times daily with meals. 01/06/23   [provider]  sorbitol 70 % solution Take 30 mLs by mouth 3 (three) times daily as needed (constipation). 01/20/23   [provider]  tamsulosin  (FLOMAX ) 0.4 MG CAPS capsule Take 0.4 mg by mouth at bedtime.    [provider]  Tiotropium Bromide-Olodaterol 2.5-2.5 MCG/ACT AERS Take 2 puffs by mouth in the  morning. 08/07/23   [provider]      Allergies    Enalapril , Gabapentin , and Iodinated contrast media    Review of Systems   Review of Systems  Physical Exam Updated Vital Signs BP (!) 146/103   Pulse 97   Temp 98.9 F (37.2 C) (Oral)   Resp 17   Ht 5' 11 (1.803 m)   Wt 60 kg   SpO2 99%   BMI 18.45 kg/m  Physical Exam Constitutional:      Appearance: He is well-developed.  HENT:     Head: Normocephalic.  Cardiovascular:     Rate and Rhythm: Normal rate and regular rhythm.     Heart sounds: No murmur heard. Pulmonary:     Effort: Pulmonary effort is normal.     Breath sounds: Normal breath sounds. No wheezing, rhonchi or rales.  Abdominal:     General: Bowel sounds are normal.     Palpations: Abdomen is soft.     Tenderness: There is no abdominal tenderness. There is no guarding or rebound.  Musculoskeletal:        General: Normal range of motion.     Cervical back: Normal range of motion and neck supple.  Skin:    General: Skin is warm and dry.  Neurological:     General: No focal deficit present.     Mental Status: He is alert and oriented to person, place, and time.     ED Results / Procedures / Treatments   Labs (all labs ordered are listed, but only abnormal results are displayed) Labs Reviewed  CBC WITH DIFFERENTIAL/PLATELET - Abnormal; Notable for the following components:      Result Value   Hemoglobin 12.6 (*)    HCT 38.5 (*)    RDW 17.7 (*)    nRBC 0.4 (*)    All other components within normal limits  COMPREHENSIVE METABOLIC PANEL WITH GFR - Abnormal; Notable for the following components:   Chloride 94 (*)    Glucose, Bld 127 (*)    BUN 34 (*)    Creatinine, Ser 7.12 (*)    Albumin  3.2 (*)    GFR, Estimated 8 (*)    Anion gap 19 (*)    All other components within normal limits  CBG MONITORING, ED - Abnormal; Notable for the following components:   Glucose-Capillary 148 (*)    All other components within normal limits   TROPONIN I (HIGH SENSITIVITY) - Abnormal; Notable for the following components:   Troponin I (High Sensitivity) 175 (*)    All other components within normal limits  TROPONIN I (HIGH SENSITIVITY) - Abnormal; Notable for the following components:   Troponin I (High Sensitivity) 174 (*)    All other components within normal limits    EKG None  Radiology DG  Chest 1 View Result Date: 01/20/2024 CLINICAL DATA:  Chest pain EXAM: CHEST  1 VIEW COMPARISON:  01/12/2024 FINDINGS: Stable cardiomegaly. Aortic atherosclerotic calcification. Retrocardiac atelectasis. Otherwise no focal consolidation, pleural effusion, or pneumothorax. No displaced rib fractures. IMPRESSION: No active disease. Cardiomegaly. Electronically Signed   By: Rozell Cornet M.D.   On: 01/20/2024 19:41    Procedures Procedures    Medications Ordered in ED Medications  albuterol  (VENTOLIN  HFA) 108 (90 Base) MCG/ACT inhaler 2 puff (2 puffs Inhalation Given 01/20/24 2204)    ED Course/ Medical Decision Making/ A&P Clinical Course as of 01/20/24 2257  Wed Jan 20, 2024  2254 Patient presents with concern for his dialysis catheter having fallen out this morning. Seen initially in the ED this morning but left without being seen, and returned later. No other acute issues. VSS. Labs c/w ESRD patient. Discussed vascular access with Dr. Charlotte Cookey (vasc) who advises that if the patient does not need admission for any other reason, he can be seen in the Access Center tomorrow to have the access re-established. Patient reviewed and discussed with Dr. Ranelle Buys, ED attending. He is found stable for discharge home. Per Dr. Charlotte Cookey he will be contacted in the morning to arrange time to be seen.  [SU]    Clinical Course User Index [SU] Mandy Second, PA-C                                 Medical Decision Making Risk Prescription drug management.           Final Clinical Impression(s) / ED Diagnoses Final diagnoses:  Complication  associated with dialysis catheter    Rx / DC Orders ED Discharge Orders     None         Mandy Second, PA-C 01/20/24 2258    Rafael Bun A, DO 01/22/24 1140

## 2024-01-20 NOTE — ED Notes (Signed)
 Pt out the door at 1051

## 2024-01-21 ENCOUNTER — Other Ambulatory Visit: Payer: Self-pay | Admitting: *Deleted

## 2024-01-21 ENCOUNTER — Encounter (HOSPITAL_COMMUNITY)
Admission: RE | Disposition: A | Discharge status: Home / Self Care | Payer: Self-pay | Source: Ambulatory Visit | Attending: Surgery

## 2024-01-21 ENCOUNTER — Ambulatory Visit (HOSPITAL_COMMUNITY)
Admit: 2024-01-21 | Discharge: 2024-01-21 | Disposition: A | Discharge status: Home / Self Care | Attending: Vascular Surgery | Admitting: Vascular Surgery

## 2024-01-21 ENCOUNTER — Ambulatory Visit (HOSPITAL_COMMUNITY)
Admission: RE | Admit: 2024-01-21 | Discharge: 2024-01-21 | Disposition: A | Discharge status: Home / Self Care | Source: Ambulatory Visit | Attending: Surgery | Admitting: Surgery

## 2024-01-21 ENCOUNTER — Other Ambulatory Visit: Payer: Self-pay

## 2024-01-21 ENCOUNTER — Encounter (HOSPITAL_COMMUNITY)

## 2024-01-21 ENCOUNTER — Telehealth: Payer: Self-pay

## 2024-01-21 DIAGNOSIS — I871 Compression of vein: Secondary | ICD-10-CM

## 2024-01-21 DIAGNOSIS — Z86718 Personal history of other venous thrombosis and embolism: Secondary | ICD-10-CM

## 2024-01-21 DIAGNOSIS — R7303 Prediabetes: Secondary | ICD-10-CM | POA: Insufficient documentation

## 2024-01-21 DIAGNOSIS — J449 Chronic obstructive pulmonary disease, unspecified: Secondary | ICD-10-CM | POA: Insufficient documentation

## 2024-01-21 DIAGNOSIS — Z992 Dependence on renal dialysis: Secondary | ICD-10-CM

## 2024-01-21 DIAGNOSIS — N186 End stage renal disease: Secondary | ICD-10-CM | POA: Diagnosis not present

## 2024-01-21 DIAGNOSIS — I120 Hypertensive chronic kidney disease with stage 5 chronic kidney disease or end stage renal disease: Secondary | ICD-10-CM | POA: Insufficient documentation

## 2024-01-21 DIAGNOSIS — F17210 Nicotine dependence, cigarettes, uncomplicated: Secondary | ICD-10-CM | POA: Diagnosis not present

## 2024-01-21 HISTORY — PX: DIALYSIS/PERMA CATHETER INSERTION: CATH118288

## 2024-01-21 SURGERY — DIALYSIS/PERMA CATHETER INSERTION
Anesthesia: LOCAL

## 2024-01-21 MED ORDER — LIDOCAINE HCL (PF) 1 % IJ SOLN
INTRAMUSCULAR | Status: DC | PRN
  Administered 2024-01-21: 20 mL

## 2024-01-21 MED ORDER — HEPARIN SODIUM (PORCINE) 1000 UNIT/ML IJ SOLN
INTRAMUSCULAR | Status: AC
  Filled 2024-01-21: qty 10

## 2024-01-21 MED ORDER — HEPARIN SODIUM (PORCINE) 1000 UNIT/ML IJ SOLN
INTRAMUSCULAR | Status: DC | PRN
  Administered 2024-01-21 (×2): 1900 [IU] via INTRAVENOUS

## 2024-01-21 MED ORDER — LIDOCAINE HCL (PF) 1 % IJ SOLN
INTRAMUSCULAR | Status: AC
  Filled 2024-01-21: qty 30

## 2024-01-21 MED ORDER — HEPARIN (PORCINE) IN NACL 1000-0.9 UT/500ML-% IV SOLN
INTRAVENOUS | Status: DC | PRN
  Administered 2024-01-21: 500 mL

## 2024-01-21 SURGICAL SUPPLY — 4 items
CATH PALINDROME-P 23 W/VT (CATHETERS) IMPLANT
KIT MICROPUNCTURE NIT STIFF (SHEATH) IMPLANT
TRAY PV CATH (CUSTOM PROCEDURE TRAY) ×1 IMPLANT
WIRE BENTSON .035X145CM (WIRE) IMPLANT

## 2024-01-21 NOTE — Op Note (Signed)
    Patient name: Ronald Reeves MRN: [id number] DOB: [birth year] Sex: male  01/21/2024 Pre-operative Diagnosis: ESRD Post-operative diagnosis:  Same Surgeon:  Gareld June Procedure:   #1: Insertion of right internal jugular vein tunneled dialysis catheter using fluoroscopic guidance   #2: Ultrasound-guided access, right internal jugular vein  Findings: The left jugular vein appeared to be occluded.  On the right side the jugular vein is dilated in the upper neck but then scar is down.  Indications: This is a 68 year old gentleman in need of access for dialysis.  He recently pulled his catheter  Procedure:  The patient was identified in the holding area and taken to [facility] 1  The patient was then placed supine on the table. local anesthesia was administered.  The patient was prepped and draped in the usual sterile fashion.  A time out was called and antibiotics were administered.  Ultrasound was used to evaluate the right internal jugular vein.  It was dilated in the upper neck however as it got down towards the clavicle it appeared to collateralized and be scarred down.  I was able to cannulate the internal jugular vein a little higher than normal under ultrasound guidance.  A 018 wire was inserted without resistance and a micropuncture sheath was placed.  I then directed a Bentson wire into the inferior vena cava using fluoroscopic guidance.  The subcutaneous track was dilated with sequential dilators and a peel-away sheath was placed.  A skin exit site below the clavicle was selected.  This was anesthetized with lidocaine  as well as the anticipated tunnel.  #11 blade was used to make a skin nick below the clavicle.  The catheter was then tunneled up to the neck incision and fed through the peel-away sheath which was then removed.  The catheter tip was located at the cavoatrial junction.  There were no kinks within the catheter.  Both ports flushed and aspirated without difficulty.  The catheter was  filled the appropriate volumes of heparin .  It was sutured to the skin with 3-0 nylon.  There was some oozing from venous pressure in the neck incision and so I placed a U-stitch over the neck incision with a Monocryl.   Impression:  #1:  Successful placement of right internal jugular vein tunneled dialysis catheter #2:  Catheter is ready for immediate use   V. Gareld June, M.D., [facility] [place] Office: [phone] Pager:  [phone]

## 2024-01-21 NOTE — Telephone Encounter (Signed)
 Pt left without being seen for LE DVT US . Pt was brought from [facility] lab and was taken back 20 minutes before scheduled appt. However, pt said he was not going to stay for his appt since his wife needed to go to work (wife arrived to get pt) and needs to be at dialysis at 26. Pt states he can come back in the morning. Dr. Charlotte Cookey made aware by [facility] that pt is leaving without being seen. [place] reminded pt to be sure to take his Eliquis . This PAA asked pt if he is aware of the importance of this visit and he said yes, and will be back tomorrow. US  visit r/s for 01/22/24 at 11am.

## 2024-01-21 NOTE — H&P (Signed)
 [facility]  Patient name: Ronald Reeves MRN: [id number] DOB: 05-Dec-1955 Sex: male   REASON FOR VISIT:    Dialysis access  HISOTRY OF PRESENT ILLNESS:    Ronald Reeves is a 68 y.o. male with end-stage renal disease who has a known clotted graft.  His catheter was accidentally removed prompting him to come to the emergency department last night.  He has missed dialysis sessions.  He is here to today because he needs new access.  Patient's renal failure secondary to diabetes and hypertension.  He suffers from COPD from chronic tobacco abuse.  Does have history of coronary artery disease   PAST MEDICAL HISTORY:   Past Medical History:  Diagnosis Date   Anemia    Anxiety    Arthritis    Back pain    Bronchitis    COPD (chronic obstructive pulmonary disease) (HCC)    Coronary artery disease    COVID    mild - flu like symptoms   Dyspnea    w/ exertion, uses inhaler   ESRD on hemodialysis (HCC)    dialysis on tues, thurs, sat at [facility]   GERD (gastroesophageal reflux disease)    not a current problem   Heart murmur    never has caused any problems   Hypertension    Myocardial infarction (HCC)    Pneumonia    x 1   Pre-diabetes    diet controlled, no meds, does not check blood sugar   Substance abuse (HCC)      FAMILY HISTORY:   Family History  Problem Relation Age of Onset   Heart disease Mother    Heart attack Sister 63    SOCIAL HISTORY:   Social History   Tobacco Use   Smoking status: Every Day    Current packs/day: 0.50    Types: Cigars, Cigarettes    Last attempt to quit: 2019    Passive exposure: Past   Smokeless tobacco: Never   Tobacco comments:    1-2 per day -  black and mild cigars  Substance Use Topics   Alcohol use: Not Currently    Alcohol/week: 14.0 standard drinks of alcohol    Types: 14 Cans of beer per week    Comment: Reports quit 8 months ago     ALLERGIES:    Allergies  Allergen Reactions   Enalapril  Other (See Comments)    Renal failure syndrome   Gabapentin      Encephalopathy, tremor   Iodinated Contrast Media Nausea And Vomiting     CURRENT MEDICATIONS:   No current facility-administered medications for this encounter.    REVIEW OF SYSTEMS:   [X]  denotes positive finding, [ ]  denotes negative finding Cardiac  Comments:  Chest pain or chest pressure:    Shortness of breath upon exertion:    Short of breath when lying flat:    Irregular heart rhythm:        Vascular    Pain in calf, thigh, or hip brought on by ambulation:    Pain in feet at night that wakes you up from your sleep:     Blood clot in your veins:    Leg swelling:         Pulmonary    Oxygen at home:    Productive cough:     Wheezing:         Neurologic    Sudden weakness in arms or legs:     Sudden numbness in arms  or legs:     Sudden onset of difficulty speaking or slurred speech:    Temporary loss of vision in one eye:     Problems with dizziness:         Gastrointestinal    Blood in stool:     Vomited blood:         Genitourinary    Burning when urinating:     Blood in urine:        Psychiatric    Major depression:         Hematologic    Bleeding problems:    Problems with blood clotting too easily:        Skin    Rashes or ulcers:        Constitutional    Fever or chills:      PHYSICAL EXAM:   There were no vitals filed for this visit.  GENERAL: The patient is a well-nourished male, in no acute distress. The vital signs are documented above. CARDIAC: There is a regular rate and rhythm.  MUSCULOSKELETAL: There are no major deformities or cyanosis. NEUROLOGIC: No focal weakness or paresthesias are detected. SKIN: There are no ulcers or rashes noted. PSYCHIATRIC: The patient has a normal affect.  STUDIES:     MEDICAL ISSUES:   ESRD: I discussed placing a tunneled dialysis catheter.  All questions were answered.  He should  be able to use this later today for dialysis since he has missed several sessions.    Marti Slates, MD, FACS [facility] [phone] Pager [phone]

## 2024-01-22 ENCOUNTER — Ambulatory Visit (HOSPITAL_COMMUNITY)
Admission: RE | Admit: 2024-01-22 | Discharge: 2024-01-22 | Disposition: A | Discharge status: Home / Self Care | Source: Ambulatory Visit | Attending: Surgery | Admitting: Surgery

## 2024-01-22 ENCOUNTER — Encounter (HOSPITAL_COMMUNITY): Payer: Self-pay | Admitting: Surgery

## 2024-01-22 DIAGNOSIS — Z86718 Personal history of other venous thrombosis and embolism: Secondary | ICD-10-CM

## 2024-01-24 ENCOUNTER — Encounter (HOSPITAL_COMMUNITY): Payer: Self-pay

## 2024-01-24 ENCOUNTER — Other Ambulatory Visit: Payer: Self-pay

## 2024-01-24 ENCOUNTER — Emergency Department (HOSPITAL_COMMUNITY)

## 2024-01-24 ENCOUNTER — Emergency Department (HOSPITAL_COMMUNITY)
Admission: EM | Admit: 2024-01-24 | Discharge: 2024-01-24 | Disposition: A | Discharge status: Home / Self Care | Attending: Emergency Medicine | Admitting: Emergency Medicine

## 2024-01-24 DIAGNOSIS — S0003XA Contusion of scalp, initial encounter: Secondary | ICD-10-CM | POA: Insufficient documentation

## 2024-01-24 DIAGNOSIS — Z8616 Personal history of COVID-19: Secondary | ICD-10-CM | POA: Diagnosis not present

## 2024-01-24 DIAGNOSIS — N186 End stage renal disease: Secondary | ICD-10-CM | POA: Diagnosis not present

## 2024-01-24 DIAGNOSIS — I120 Hypertensive chronic kidney disease with stage 5 chronic kidney disease or end stage renal disease: Secondary | ICD-10-CM | POA: Insufficient documentation

## 2024-01-24 DIAGNOSIS — Z79899 Other long term (current) drug therapy: Secondary | ICD-10-CM | POA: Insufficient documentation

## 2024-01-24 DIAGNOSIS — J449 Chronic obstructive pulmonary disease, unspecified: Secondary | ICD-10-CM | POA: Diagnosis not present

## 2024-01-24 DIAGNOSIS — Z7901 Long term (current) use of anticoagulants: Secondary | ICD-10-CM | POA: Diagnosis not present

## 2024-01-24 DIAGNOSIS — I2510 Atherosclerotic heart disease of native coronary artery without angina pectoris: Secondary | ICD-10-CM | POA: Diagnosis not present

## 2024-01-24 DIAGNOSIS — R2242 Localized swelling, mass and lump, left lower limb: Secondary | ICD-10-CM | POA: Insufficient documentation

## 2024-01-24 DIAGNOSIS — W108XXA Fall (on) (from) other stairs and steps, initial encounter: Secondary | ICD-10-CM | POA: Diagnosis not present

## 2024-01-24 DIAGNOSIS — M545 Low back pain, unspecified: Secondary | ICD-10-CM | POA: Diagnosis present

## 2024-01-24 DIAGNOSIS — Z992 Dependence on renal dialysis: Secondary | ICD-10-CM | POA: Diagnosis not present

## 2024-01-24 DIAGNOSIS — M7989 Other specified soft tissue disorders: Secondary | ICD-10-CM

## 2024-01-24 DIAGNOSIS — W19XXXA Unspecified fall, initial encounter: Secondary | ICD-10-CM

## 2024-01-24 LAB — PROTIME-INR
INR: 1.5 — ABNORMAL HIGH (ref 0.8–1.2)
Prothrombin Time: 17.8 s — ABNORMAL HIGH (ref 11.4–15.2)

## 2024-01-24 LAB — CBC WITH DIFFERENTIAL/PLATELET
Abs Immature Granulocytes: 0.02 10*3/uL (ref 0.00–0.07)
Basophils Absolute: 0 10*3/uL (ref 0.0–0.1)
Basophils Relative: 1 %
Eosinophils Absolute: 0.1 10*3/uL (ref 0.0–0.5)
Eosinophils Relative: 1 %
HCT: 37.4 % — ABNORMAL LOW (ref 39.0–52.0)
Hemoglobin: 12 g/dL — ABNORMAL LOW (ref 13.0–17.0)
Immature Granulocytes: 1 %
Lymphocytes Relative: 17 %
Lymphs Abs: 0.8 10*3/uL (ref 0.7–4.0)
MCH: 27 pg (ref 26.0–34.0)
MCHC: 32.1 g/dL (ref 30.0–36.0)
MCV: 84 fL (ref 80.0–100.0)
Monocytes Absolute: 0.8 10*3/uL (ref 0.1–1.0)
Monocytes Relative: 17 %
Neutro Abs: 2.8 10*3/uL (ref 1.7–7.7)
Neutrophils Relative %: 63 %
Platelets: 165 10*3/uL (ref 150–400)
RBC: 4.45 MIL/uL (ref 4.22–5.81)
RDW: 18.1 % — ABNORMAL HIGH (ref 11.5–15.5)
WBC: 4.4 10*3/uL (ref 4.0–10.5)
nRBC: 0.7 % — ABNORMAL HIGH (ref 0.0–0.2)

## 2024-01-24 LAB — COMPREHENSIVE METABOLIC PANEL WITH GFR
ALT: 12 U/L (ref 0–44)
AST: 25 U/L (ref 15–41)
Albumin: 3.1 g/dL — ABNORMAL LOW (ref 3.5–5.0)
Alkaline Phosphatase: 109 U/L (ref 38–126)
Anion gap: 18 — ABNORMAL HIGH (ref 5–15)
BUN: 33 mg/dL — ABNORMAL HIGH (ref 8–23)
CO2: 26 mmol/L (ref 22–32)
Calcium: 8.8 mg/dL — ABNORMAL LOW (ref 8.9–10.3)
Chloride: 91 mmol/L — ABNORMAL LOW (ref 98–111)
Creatinine, Ser: 6.34 mg/dL — ABNORMAL HIGH (ref 0.61–1.24)
GFR, Estimated: 9 mL/min — ABNORMAL LOW (ref >60)
Glucose, Bld: 120 mg/dL — ABNORMAL HIGH (ref 70–99)
Potassium: 3.8 mmol/L (ref 3.5–5.1)
Sodium: 135 mmol/L (ref 135–145)
Total Bilirubin: 0.7 mg/dL (ref 0.0–1.2)
Total Protein: 7.1 g/dL (ref 6.5–8.1)

## 2024-01-24 LAB — MAGNESIUM: Magnesium: 2.3 mg/dL (ref 1.7–2.4)

## 2024-01-24 MED ORDER — IPRATROPIUM-ALBUTEROL 0.5-2.5 (3) MG/3ML IN SOLN
3.0000 mL | Freq: Once | RESPIRATORY_TRACT | Status: AC
  Administered 2024-01-24: 3 mL via RESPIRATORY_TRACT
  Filled 2024-01-24: qty 3

## 2024-01-24 MED ORDER — ALBUTEROL SULFATE HFA 108 (90 BASE) MCG/ACT IN AERS
1.0000 | INHALATION_SPRAY | Freq: Once | RESPIRATORY_TRACT | Status: AC
  Administered 2024-01-24: 1 via RESPIRATORY_TRACT
  Filled 2024-01-24: qty 6.7

## 2024-01-24 MED ORDER — FENTANYL CITRATE PF 50 MCG/ML IJ SOSY
50.0000 ug | PREFILLED_SYRINGE | Freq: Once | INTRAMUSCULAR | Status: AC
  Administered 2024-01-24: 50 ug via INTRAVENOUS
  Filled 2024-01-24: qty 1

## 2024-01-24 NOTE — ED Notes (Signed)
 This RN received call back form CT Ronald Reeves stating room was now available. Pain med given to patient prior to this RN transporting to CT.

## 2024-01-24 NOTE — ED Notes (Signed)
 This RN reviewed discharge instructions with patient. he verbalized understanding and denied any further questions. PT well appearing upon discharge and reports no pain. Pt wheeled out by tech to wifes car. Pt used cane from w/c to car.

## 2024-01-24 NOTE — ED Provider Notes (Signed)
 [place] EMERGENCY DEPARTMENT AT [facility] Provider Note   CSN: [id number] Arrival date & time: 01/24/24  0725     History  Chief Complaint  Patient presents with   Ronald Reeves is a 68 y.o. male with PMH as listed below who presents with fall. Fell down some stairs yesterday and did hit his head. Did not lose consciousness. Feet slipped down the stairs because his balance isn't good. C/o pain in lumbar spine and R hip/knee. Dialysis patient, got full session of hemodialysis yesterday throught R upper chest catheter. Pt takes eliquis .  Patient also noted to have significant L leg swelling. Wife at bedside notes it's been there for 2-3 weeks. Patient states he had bilateral LE DVT US 's which showed old clots but no new ones.  Level 2 trauma.   Past Medical History:  Diagnosis Date   Anemia    Anxiety    Arthritis    Back pain    Bronchitis    COPD (chronic obstructive pulmonary disease) (HCC)    Coronary artery disease    COVID    mild - flu like symptoms   Dyspnea    w/ exertion, uses inhaler   ESRD on hemodialysis (HCC)    dialysis on tues, thurs, sat at [facility]   GERD (gastroesophageal reflux disease)    not a current problem   Heart murmur    never has caused any problems   Hypertension    Myocardial infarction (HCC)    Pneumonia    x 1   Pre-diabetes    diet controlled, no meds, does not check blood sugar   Substance abuse (HCC)        Home Medications Prior to Admission medications   Medication Sig Start Date End Date Taking? Authorizing Provider  albuterol  (VENTOLIN  HFA) 108 (90 Base) MCG/ACT inhaler Inhale 2 puffs into the lungs every 6 (six) hours as needed (COPD).    [provider]  amLODipine  (NORVASC ) 10 MG tablet Take 1 tablet (10 mg total) by mouth daily. Patient taking differently: Take 10 mg by mouth daily. Hold for systolic BP <100 6/64/40   Lenise Quince, MD  apixaban  (ELIQUIS ) 5 MG TABS tablet Take 1 tablet (5 mg  total) by mouth 2 (two) times daily. 03/06/23   Atway, Rayann N, DO  ARIPiprazole  (ABILIFY ) 5 MG tablet Take 1 tablet (5 mg total) by mouth daily. 11/20/23   Ghimire, Estil Heman, MD  B Complex-C-Folic Acid  (DIALYVITE  TABLET) TABS Take 1 tablet by mouth daily. 12/23/22   [provider]  carvedilol  (COREG ) 12.5 MG tablet Take 12.5 mg by mouth 2 (two) times daily with a meal.    [provider]  escitalopram  (LEXAPRO ) 20 MG tablet Take 0.5 tablets (10 mg total) by mouth at bedtime. 11/19/23   Ghimire, Estil Heman, MD  ferric citrate  (AURYXIA) 1 GM 210 MG(Fe) tablet Take 210 mg by mouth 3 (three) times daily with meals.    [provider]  furosemide  (LASIX ) 40 MG tablet Take 40 mg by mouth daily.    [provider]  hydrALAZINE  (APRESOLINE ) 25 MG tablet Take 25 mg by mouth 2 (two) times daily.    [provider]  isosorbide  mononitrate (IMDUR ) 30 MG 24 hr tablet Take 30 mg by mouth daily.    [provider]  lidocaine  (LIDODERM ) 5 % Place 2 patches onto the skin daily. Remove & Discard patch within 12 hours or as directed by MD  [provider]  naloxone  (NARCAN ) nasal spray 4 mg/0.1 mL Place 0.4 mg into the nose See admin instructions.  SPRAY 1 SPRAY INTO ONE NOSTRIL AS DIRECTED AS NEEDED FOR RESCUE FOR OPIOID OVERDOSE - CALL 911 IMMEDIATELY, ADMINISTER DOSE, THEN TURN PERSON ON SIDE - IF NO RESPONSE IN 2-3 MINUTES OR PERSON RESPONDS BUT RELAPSES, REPEAT USING A NEW SPRAY DEVICE AND SPRAY INTO THE OTHER NOSTRIL AS NEEDED FOR RESCUE 01/21/23   [provider]  omeprazole  (PRILOSEC) 40 MG capsule Take 1 capsule (40 mg total) by mouth daily. Patient taking differently: Take 40 mg by mouth daily as needed. 06/06/22   Kennedy-Smith, Colleen M, NP  ondansetron  (ZOFRAN -ODT) 8 MG disintegrating tablet Take 8 mg by mouth every 6 (six) hours. 11/06/23   [provider]  oxyCODONE -acetaminophen  (PERCOCET/ROXICET) 5-325 MG tablet Take 1  tablet by mouth every 4 (four) hours as needed for severe pain (pain score 7-10). 10/20/23   Carlene Che, MD  PSYLLIUM PO Take by mouth. 09/03/23   [provider]  rosuvastatin  (CRESTOR ) 10 MG tablet Take 10 mg by mouth daily.    [provider]  sevelamer  carbonate (RENVELA ) 800 MG tablet Take 1,600 mg by mouth 3 (three) times daily with meals. 01/06/23   [provider]  sorbitol 70 % solution Take 30 mLs by mouth 3 (three) times daily as needed (constipation). 01/20/23   [provider]  tamsulosin  (FLOMAX ) 0.4 MG CAPS capsule Take 0.4 mg by mouth at bedtime.    [provider]  Tiotropium Bromide-Olodaterol 2.5-2.5 MCG/ACT AERS Take 2 puffs by mouth in the morning. 08/07/23   [provider]      Allergies    Enalapril , Gabapentin , and Iodinated contrast media    Review of Systems   Review of Systems A 10 point review of systems was performed and is negative unless otherwise reported in HPI.  Physical Exam Updated Vital Signs BP 122/72   Pulse 100   Temp 97.6 F (36.4 C)   Resp (!) 24   Ht 5' 11 (1.803 m)   Wt 68 kg   SpO2 93%   BMI 20.91 kg/m  Physical Exam  PRIMARY SURVEY  Airway Airway intact  Breathing Bilateral breath sounds  Circulation Carotid/femoral pulses 2+ intact bilaterally  GCS E =  4 V =  5 M =  6 Total = 15  Environment All clothes removed      SECONDARY SURVEY  Gen: -NAD  HEENT: -Head: NCAT. Scalp is clear of lacerations or wounds. Mild hematoma/contusion noted to posteiror scalp. Skull is clear of deformities or depressions -Forehead: Normal -Midface: Stable -Eyes: No visible injury to eyelids or eye, PERRL, EOMI -Nose: No gross deformities -Mouth: No injuries to lips, tongue or teeth. No trismus or malposition -Ears: No auricular hematoma -Neck: Trachea is midline, no distended neck veins  Chest: -No tenderness, deformities, bruising or crepitus to clavicles or chest -Normal chest  expansion -Normal heart sounds, S1/S2 normal, no m/r/g -No wheezes, rales, rhonchi  Abdomen: -No tenderness, bruising or penetrating injury  Pelvis: -Pelvis is stable and non-tender  Extremities: Right Upper Extremity: -No point tenderness, deformity or other signs of injury -Radial pulse intact RUE, cap refill good -Normal sensation -Normal ROM, good strength Left Upper Extremity: -No point tenderness, deformity or other signs of injury -Radial pulse intact LUE, cap refill good -Normal sensation -Normal ROM, good strength Right Lower Extremity: -Mild point tenderness diffusely throughout R knee without deformity or other signs  of injury -DP intact RLE -Normal sensation -Normal ROM, good strength. Intact flexion/extension of hip/knee. Left Lower Extremity: -No point tenderness, deformity or other signs of injury -DP intact LLE -Normal sensation -Normal ROM, good strength  Back/Spine: -+midline C, T and L spine TTP without deformities or step-offs -R paraspinal muscular TTP -Collar: maimi J  Other: N/A     ED Results / Procedures / Treatments   Labs (all labs ordered are listed, but only abnormal results are displayed) Labs Reviewed  CBC WITH DIFFERENTIAL/PLATELET - Abnormal; Notable for the following components:      Result Value   Hemoglobin 12.0 (*)    HCT 37.4 (*)    RDW 18.1 (*)    nRBC 0.7 (*)    All other components within normal limits  COMPREHENSIVE METABOLIC PANEL WITH GFR - Abnormal; Notable for the following components:   Chloride 91 (*)    Glucose, Bld 120 (*)    BUN 33 (*)    Creatinine, Ser 6.34 (*)    Calcium  8.8 (*)    Albumin  3.1 (*)    GFR, Estimated 9 (*)    Anion gap 18 (*)    All other components within normal limits  PROTIME-INR - Abnormal; Notable for the following components:   Prothrombin Time 17.8 (*)    INR 1.5 (*)    All other components within normal limits  MAGNESIUM     EKG EKG Interpretation Date/Time:  Sunday January 24 2024  08:25:09 EDT Ventricular Rate:  90 PR Interval:  217 QRS Duration:  99 QT Interval:  407 QTC Calculation: 498 R Axis:   -61  Text Interpretation: Sinus rhythm Borderline prolonged PR interval Left atrial enlargement Left anterior fascicular block LVH with secondary repolarization abnormality Anterior infarct, old Similar to prior EKG Confirmed by Annita Kindle [phone]) on 01/24/2024 1:43:24 PM  Radiology VAS US  LOWER EXTREMITY VENOUS (DVT) Result Date: 01/22/2024  Lower Venous DVT Study Patient Name:  Latrell Potempa  Date of Exam:   01/22/2024 Medical Rec #: [id number]   Accession #:    [id number] Date of Birth: July 25, 1956   Patient Gender: M Patient Age:   61 years Exam Location:  [address] Procedure:      VAS US  LOWER EXTREMITY VENOUS (DVT) Referring Phys: Genny Kid --------------------------------------------------------------------------------  Indications: Bilateral lower extremity swelling/edema. History of DVT. Patient denies any unusual SOB.  Risk Factors: DVT history in both lower extremities. Comparison Study: NA Performing Technologist: Doren Gammons RVT  Examination Guidelines: A complete evaluation includes B-mode imaging, spectral Doppler, color Doppler, and power Doppler as needed of all accessible portions of each vessel. Bilateral testing is considered an integral part of a complete examination. Limited examinations for reoccurring indications may be performed as noted. The reflux portion of the exam is performed with the patient in reverse Trendelenburg.  +---------+---------------+---------+-----------+---------------+-------------+ RIGHT    CompressibilityPhasicitySpontaneityProperties     Thrombus                                                                 Aging         +---------+---------------+---------+-----------+---------------+-------------+ CFV      Full                    Yes                                      +---------+---------------+---------+-----------+---------------+-------------+  SFJ      Full                    Yes                                     +---------+---------------+---------+-----------+---------------+-------------+ FV Prox  Full                    Yes                                     +---------+---------------+---------+-----------+---------------+-------------+ FV Mid   Partial                            brightly       Chronic                                                   echogenic                    +---------+---------------+---------+-----------+---------------+-------------+ FV DistalPartial                            brightly       Chronic                                                   echogenic                    +---------+---------------+---------+-----------+---------------+-------------+ PFV      Full                    Yes                                     +---------+---------------+---------+-----------+---------------+-------------+ POP      Full                    Yes                                     +---------+---------------+---------+-----------+---------------+-------------+ PTV      Full                                                            +---------+---------------+---------+-----------+---------------+-------------+ PERO     Partial                            brightly       Chronic  echogenic                    +---------+---------------+---------+-----------+---------------+-------------+ GSV      Full                                                            +---------+---------------+---------+-----------+---------------+-------------+   +--------+---------------+---------+-----------+----------------+-------------+ LEFT    CompressibilityPhasicitySpontaneityProperties      Thrombus                                                                  Aging         +--------+---------------+---------+-----------+----------------+-------------+ CFV     Full                    Yes                                      +--------+---------------+---------+-----------+----------------+-------------+ SFJ     Full                    Yes                                      +--------+---------------+---------+-----------+----------------+-------------+ FV Prox Full                    Yes                                      +--------+---------------+---------+-----------+----------------+-------------+ FV Mid  Full                                                             +--------+---------------+---------+-----------+----------------+-------------+ FV      Full                                                             Distal                                                                   +--------+---------------+---------+-----------+----------------+-------------+ PFV     Full                    Yes                                      +--------+---------------+---------+-----------+----------------+-------------+  POP     Full                    Yes                                      +--------+---------------+---------+-----------+----------------+-------------+ PTV     Full                                                             +--------+---------------+---------+-----------+----------------+-------------+ PERO    Partial                            brightly        Chronic                                                  echogenic                     +--------+---------------+---------+-----------+----------------+-------------+ GSV     Full                                                             +--------+---------------+---------+-----------+----------------+-------------+     Summary: RIGHT: - Findings consistent with 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 - No cystic structure found in the popliteal fossa. - Pulsatile lower limb venous Doppler waveforms correlates well with increase right atrium pressure; right heart failure. - All other veins visualized appear fully compressible and demonstrate appropriate Doppler characteristics.  LEFT: - Findings consistent with chronic deep vein thrombosis involving the left peroneal veins. - No cystic structure found in the popliteal fossa. - Pulsatile lower limb venous Doppler waveforms correlates well with increase right atrium pressure; right heart failure. - All other veins visualized appear fully compressible and demonstrate appropriate Doppler characteristics.  *See table(s) above for measurements and observations. Electronically signed by Delaney Fearing on 01/22/2024 at 4:12:46 PM.    Final     Procedures Procedures    Medications Ordered in ED Medications  ipratropium-albuterol  (DUONEB) 0.5-2.5 (3) MG/3ML nebulizer solution 3 mL (has no administration in time range)  fentaNYL  (SUBLIMAZE ) injection 50 mcg (has no administration in time range)  albuterol  (VENTOLIN  HFA) 108 (90 Base) MCG/ACT inhaler 1 puff (1 puff Inhalation Given 01/24/24 0746)    ED Course/ Medical Decision Making/ A&P                          Medical Decision Making Amount and/or Complexity of Data Reviewed Labs: ordered. Radiology: ordered. Decision-making details documented in ED Course.  Risk Prescription drug management.    This patient presents to the ED for concern of fall, leg swelling; this involves an extensive number of treatment options, and is a complaint that carries with it a high risk  of complications and morbidity.  I considered the following differential and admission for this acute, potentially life threatening condition.   MDM:    DDX for trauma includes but is not limited to:  -Head Injury such as skull fx or ICH -Consider  Vertebral injury - placed in C-collar, will get CT spine -Fractures - no deformities to RLE and patient has been ambulatory on it but will get XRs -Patient is mildly tachypneic and with mild expiratory wheezing on exam, will give duoneb w/ h/o COPD and reevaluate  For patient's leg swelling,  DVT US  from 01/22/24: RIGHT:  - Findings consistent with chronic deep vein thrombosis involving the  right femoral vein, and right peroneal veins.  - No cystic structure found in the popliteal fossa.  - Pulsatile lower limb venous Doppler waveforms correlates well with  increase right atrium pressure; right heart failure.  - All other veins visualized appear fully compressible and demonstrate  appropriate Doppler characteristics.    LEFT:  - Findings consistent with chronic deep vein thrombosis involving the left  peroneal veins.  - No cystic structure found in the popliteal fossa.  - Pulsatile lower limb venous Doppler waveforms correlates well with  increase right atrium pressure; right heart failure.  - All other veins visualized appear fully compressible and demonstrate  appropriate Doppler characteristics.   I am concerned for a more proximal clot, as the patient has swelling that extends more proximally than the left peroneal veins, as he has whole-leg swelling. Concern for iliocaval involvement and will obtain CTV. Additionally, these findings on US  appear chronic, but wife states this whole-leg swelling is new, raising concern for a more acute process.   Discussed with the patient and his wife. Patient states that he does not want to get dialysis on any days that are not his dialysis days.  He also states he does not want to stay in the ER for any longer than he has to.  Wife states that typically patient stays for a few hours in the ER and then ultimately ends up leaving AGAINST MEDICAL ADVICE.  Patient agrees with this and states that he will not be staying any longer than he has to.  I  explained to the patient that there is a chance he could have more proximal blood clot that would necessitate a change in his treatment plan and patient reports understanding stating that he does still does not want to get IV contrast or do any further workup or additional imaging for his leg swelling if it relates to contrast.  Clinical Course as of 01/24/24 1341  Sun Jan 24, 2024  0957 CT Cervical Spine Wo Contrast 1. No acute abnormality of the cervical spine related to the reported trauma. 2. Stable moderate severe foraminal stenosis bilaterally at C3-4, C4-5, C5-6, and C7-T1, and moderate foraminal narrowing bilaterally at C6-7.   [HN]  0957 CT Lumbar Spine Wo Contrast 1. No evidence of acute traumatic injury. 2. Solid fusion at L3-4 with chronic grade 1 anterolisthesis and bilateral osseous foraminal narrowing. 3. Fused anterior osteophytes at L1-L2 and L4-5.   [HN]  0981 CT Thoracic Spine Wo Contrast 1. No acute abnormality of the thoracic spine related to the reported back trauma. 2. Chronic fusion of anterior osteophytes is stable, consistent with DISH.   [HN]  0958 CT Head Wo Contrast 1. No acute intracranial abnormality. 2. Mild occipital scalp soft tissue swelling and bilateral periorbital soft tissue swelling, without underlying fracture.   [HN]  [phone]  DG Pelvis Portable 1. No acute fracture is seen. 2. 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 3. Moderate patellofemoral osteoarthritis of the right knee.   [HN]  0958 DG Chest Portable 1 View 1. No acute cardiopulmonary process. 2. Moderate to marked cardiomegaly, unchanged. 3. Right internal jugular dual-lumen central venous catheter is new from 01/20/2024.   [HN]  1020 Patient has reassuring traumatic workup.  He is reevaluated and he feels improved and he states he is ready to be discharged.  He did state that he still has some back pain and request some opiate pain medication.  I suggested  instead Tylenol  1000 mg every 8 hours.  Wife asks about ibuprofen  and I relayed that we tend not to use ibuprofen  in patients with kidney disease and patients on blood thinners, of which she has both.  They report understanding.  Patient will be discharged, instructed to follow-up with PCP within 1 week, given discharge instructions and return precautions, all questions answered to patient and his wife's satisfaction. [HN]    Clinical Course User Index [HN] Merdis Stalling, MD    Labs: I Ordered, and personally interpreted labs.  The pertinent results include:  those listed above  Imaging Studies ordered: I ordered imaging studies including RLE XRs, CXR, PXR, CTH, CT spine I independently visualized and interpreted imaging. I agree with the radiologist interpretation  Additional history obtained from chart review, wife at bedside.    Cardiac Monitoring: The patient was maintained on a cardiac monitor.  I personally viewed and interpreted the cardiac monitored which showed an underlying rhythm of: Normal sinus rhythm  Reevaluation: After the interventions noted above, I reevaluated the patient and found that they have :improved  Social Determinants of Health:  lives independently  Disposition:  DC  Co morbidities that complicate the patient evaluation  Past Medical History:  Diagnosis Date   Anemia    Anxiety    Arthritis    Back pain    Bronchitis    COPD (chronic obstructive pulmonary disease) (HCC)    Coronary artery disease    COVID    mild - flu like symptoms   Dyspnea    w/ exertion, uses inhaler   ESRD on hemodialysis (HCC)    dialysis on tues, thurs, sat at [facility]   GERD (gastroesophageal reflux disease)    not a current problem   Heart murmur    never has caused any problems   Hypertension    Myocardial infarction (HCC)    Pneumonia    x 1   Pre-diabetes    diet controlled, no meds, does not check blood sugar   Substance abuse (HCC)      Medicines Meds  ordered this encounter  Medications   albuterol  (VENTOLIN  HFA) 108 (90 Base) MCG/ACT inhaler 1 puff   ipratropium-albuterol  (DUONEB) 0.5-2.5 (3) MG/3ML nebulizer solution 3 mL   fentaNYL  (SUBLIMAZE ) injection 50 mcg    I have reviewed the patients home medicines and have made adjustments as needed  Problem List / ED Course: Problem List Items Addressed This Visit   None Visit Diagnoses       Fall in home, initial encounter    -  Primary     Contusion of scalp, initial encounter         Left leg swelling                       This note was created using dictation software, which may contain  spelling or grammatical errors.    Merdis Stalling, MD 01/24/24 [phone]

## 2024-01-24 NOTE — ED Notes (Addendum)
 This RN Called CT and notified that we have levelled patient to bring patient over, CT Shelagh Derrick  stated all scanners were full and that she would call back when available.

## 2024-01-24 NOTE — Discharge Instructions (Signed)
 Thank you for coming to [facility] Emergency Department. You were seen for fall at home. We did an exam, labs, and imaging, and these showed no acute findings. We recommended a CT venogram of the abdomen/pelvis and legs to further evaluate your leg swelling but you refused.  You can take tylenol  at home 1,000 mg every 8 hours for pain and discomfort. Lidocaine  patch, rest, ice, elevation can also help with pain.  Please follow up with your primary care provider within 1 week.   Do not hesitate to return to the ED or call 911 if you experience: -Worsening symptoms -Numbness/tingling of the leg -Weakness in lower extremities -Loss of bladder or bowel -Lightheadedness, passing out -Fevers/chills -Anything else that concerns you

## 2024-01-24 NOTE — ED Notes (Addendum)
 This RN called CT again and Shelagh Derrick from CT stated that room is still unavailable.

## 2024-01-24 NOTE — ED Notes (Signed)
 [place] applied per verbal order from Drury Geralds MD

## 2024-01-24 NOTE — ED Triage Notes (Signed)
 Pt states he fell on stairs yesterday and hit head, right shoulder, right knee, hip and back. Pt denies LOC. Pt takes eliquis .

## 2024-01-24 NOTE — Progress Notes (Signed)
 Orthopedic Tech Progress Note Patient Details:  Ronald Reeves 12-03-1955 [id number]  Patient ID: Ronald Reeves, male   DOB: September 27, 1955, 68 y.o.   MRN: [id number] Level II; not currently needed. Ronald Reeves 01/24/2024, 7:55 AM

## 2024-01-24 NOTE — ED Notes (Signed)
 This RN called multiple times to ct, ct does not have scanner available for patient at this time and will call when ready.

## 2024-01-24 NOTE — ED Notes (Addendum)
 This RN called CT  again at 0807, CT Shelagh Derrick stated that there was still no room available and she would call when available. MD noted.

## 2024-01-26 ENCOUNTER — Emergency Department (HOSPITAL_COMMUNITY)
Admission: EM | Admit: 2024-01-26 | Discharge: 2024-01-26 | Discharge status: Against Medical Advice | Attending: Emergency Medicine | Admitting: Emergency Medicine
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
## 2024-01-26 NOTE — ED Notes (Signed)
 Pt leave prior to signing AMA form; Dr.Tegeler explained risks of leaving AMA and strict return precautions
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
## 2024-01-27 ENCOUNTER — Emergency Department (HOSPITAL_COMMUNITY)

## 2024-01-27 ENCOUNTER — Emergency Department (HOSPITAL_COMMUNITY)
Admission: EM | Admit: 2024-01-27 | Discharge: 2024-01-27 | Disposition: A | Discharge status: Home / Self Care | Attending: Emergency Medicine | Admitting: Emergency Medicine

## 2024-01-27 ENCOUNTER — Encounter (HOSPITAL_COMMUNITY): Payer: Self-pay | Admitting: Emergency Medicine

## 2024-01-27 DIAGNOSIS — N186 End stage renal disease: Secondary | ICD-10-CM | POA: Diagnosis not present

## 2024-01-27 DIAGNOSIS — Z7901 Long term (current) use of anticoagulants: Secondary | ICD-10-CM | POA: Diagnosis not present

## 2024-01-27 DIAGNOSIS — I509 Heart failure, unspecified: Secondary | ICD-10-CM | POA: Diagnosis not present

## 2024-01-27 DIAGNOSIS — Y92410 Unspecified street and highway as the place of occurrence of the external cause: Secondary | ICD-10-CM | POA: Diagnosis not present

## 2024-01-27 DIAGNOSIS — S8991XA Unspecified injury of right lower leg, initial encounter: Secondary | ICD-10-CM | POA: Diagnosis present

## 2024-01-27 DIAGNOSIS — S82121A Displaced fracture of lateral condyle of right tibia, initial encounter for closed fracture: Secondary | ICD-10-CM | POA: Insufficient documentation

## 2024-01-27 DIAGNOSIS — S82141A Displaced bicondylar fracture of right tibia, initial encounter for closed fracture: Secondary | ICD-10-CM

## 2024-01-27 DIAGNOSIS — Z5329 Procedure and treatment not carried out because of patient's decision for other reasons: Secondary | ICD-10-CM | POA: Diagnosis not present

## 2024-01-27 MED ORDER — ALBUTEROL SULFATE HFA 108 (90 BASE) MCG/ACT IN AERS
1.0000 | INHALATION_SPRAY | Freq: Once | RESPIRATORY_TRACT | Status: AC
  Administered 2024-01-27: 1 via RESPIRATORY_TRACT
  Filled 2024-01-27: qty 6.7

## 2024-01-27 MED ORDER — OXYCODONE-ACETAMINOPHEN 5-325 MG PO TABS
1.0000 | ORAL_TABLET | Freq: Once | ORAL | Status: AC
  Administered 2024-01-27: 1 via ORAL
  Filled 2024-01-27: qty 1

## 2024-01-27 MED ORDER — OXYCODONE HCL 5 MG PO TABS: 5.0000 mg | ORAL_TABLET | Freq: Four times a day (QID) | ORAL | 0 refills | Status: AC | PRN

## 2024-01-27 NOTE — ED Notes (Signed)
 Patient transported to X-ray

## 2024-01-27 NOTE — ED Notes (Signed)
 Patient transported to CT
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
## 2024-01-27 NOTE — Discharge Instructions (Addendum)
 Wear the straight leg brace and use crutches until you can be evaluated by orthopedics next week. Do not walk on your right leg until then.  If you develop new weakness, vision changes, slurred speech, or the worse headache of your life - come to the ED for imaging.  Get help right away if: You have severe pain or swelling. You have new pain, swelling, or warmth in your lower leg. Your toes or foot: Are unusually cold. Turn a bluish color. Are numb. You have chest pain. You have difficulty breathing.

## 2024-01-27 NOTE — Progress Notes (Signed)
 Orthopedic Tech Progress Note Patient Details:  Ronald Reeves 25-Dec-1955 [id number]  Ortho Devices Type of Ortho Device: Knee Immobilizer, Crutches Ortho Device/Splint Location: RLE Ortho Device/Splint Interventions: Application   Post Interventions Patient Tolerated: Well  Ronald Reeves 01/27/2024, 2:44 PM

## 2024-02-03 ENCOUNTER — Emergency Department (HOSPITAL_COMMUNITY)
Admission: EM | Admit: 2024-02-03 | Discharge: 2024-02-03 | Discharge status: Against Medical Advice | Attending: Emergency Medicine | Admitting: Emergency Medicine

## 2024-02-03 ENCOUNTER — Other Ambulatory Visit: Payer: Self-pay

## 2024-02-03 DIAGNOSIS — Z5321 Procedure and treatment not carried out due to patient leaving prior to being seen by health care provider: Secondary | ICD-10-CM | POA: Insufficient documentation

## 2024-02-03 DIAGNOSIS — R0602 Shortness of breath: Secondary | ICD-10-CM | POA: Diagnosis present

## 2024-02-03 NOTE — ED Triage Notes (Signed)
 Patient reports SOB and requesting hemodialysis , did not complete his treatment yesterday , denies chest pain . Patient decided to leave before the end of triage process.

## 2024-02-23 ENCOUNTER — Telehealth: Payer: Self-pay | Admitting: Licensed Clinical Social Worker

## 2024-02-23 ENCOUNTER — Ambulatory Visit: Attending: Vascular Surgery | Admitting: Vascular Surgery

## 2024-02-23 ENCOUNTER — Encounter: Payer: Self-pay | Admitting: Vascular Surgery

## 2024-02-23 ENCOUNTER — Other Ambulatory Visit: Payer: Self-pay

## 2024-02-23 VITALS — BP 134/89 | HR 94 | Temp 98.1°F

## 2024-02-23 DIAGNOSIS — N186 End stage renal disease: Secondary | ICD-10-CM

## 2024-02-23 DIAGNOSIS — Z992 Dependence on renal dialysis: Secondary | ICD-10-CM
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
## 2024-02-24 ENCOUNTER — Other Ambulatory Visit: Payer: Self-pay

## 2024-02-24 ENCOUNTER — Telehealth: Payer: Self-pay | Admitting: Licensed Clinical Social Worker

## 2024-02-24 DIAGNOSIS — I739 Peripheral vascular disease, unspecified: Secondary | ICD-10-CM

## 2024-02-24 DIAGNOSIS — Z86718 Personal history of other venous thrombosis and embolism: Secondary | ICD-10-CM
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
## 2024-02-25 ENCOUNTER — Telehealth: Payer: Self-pay | Admitting: Licensed Clinical Social Worker
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
## 2024-03-02 ENCOUNTER — Other Ambulatory Visit

## 2024-03-04 NOTE — ED Provider Notes (Signed)
 ------------------------------------------------------------------------------- Attestation signed by Therisa KANDICE Silvan, MD at 03/04/24 1932 I attest that I am the attending provider for this patient and have reviewed the medical decision-making and plan as part of ongoing quality measures.  I was physically present in the department and available for consultation.  This case was discussed with me during the patient's time in the ED.  -------------------------------------------------------------------------------   [facility]  ED Provider Note  Ronald Reeves 68 y.o. male DOB: Nov 17, 1955 MRN: [id number] History   Chief Complaint  Patient presents with  . Urinary Retention    Sts hasn't urinated x 4 days   Patient is a 67 year old male with a PMHx significant for end-stage renal disease on dialysis who presents the emergency department today with difficulty urinating.  Initially called out to EMS for back pain, however on upon arrival he states that he is experiencing difficulty urinating.  He typically urinates about once per week but states that over the past 4 days he has been unable to urinate at all.  He reports significant discomfort due to this and feels as if he needs to urinate but cannot.  He went to his dialysis appointment on Tuesday but did not go yesterday due to diarrhea.  Next dialysis appointment is scheduled for tomorrow.  He has no other complaints or concerns at this time.   Urinary Retention Presenting symptoms: dysuria        Past Medical History:  Diagnosis Date  . Anesthesia complication    BP ELEVATED, ALSO TROUBLE URINATING  . Arthritis   . Coronary artery disease   . ESRD (end stage renal disease)  (*)   . GERD (gastroesophageal reflux disease)   . Hypertension     Past Surgical History:  Procedure Laterality Date  . Back surgery    . Lumbar fusion    . Neck surgery      Social History   Substance and Sexual Activity   Alcohol Use Yes  . Alcohol/week: 7.0 standard drinks of alcohol  . Types: 7 Cans of beer per week   Comment: 24OZ CAN OF BEER A DAY- NOT RECENTLY WITH TAKING PAIN MED   Tobacco Use History[1] E-Cigarettes  . Vaping Use Never User   . Start Date    . Cartridges/Day    . Quit Date     Social History   Substance and Sexual Activity  Drug Use Yes  . Types: Crack cocaine , Marijuana         Allergies[2]  Discharge Medication List as of 03/04/2024 12:44 PM     CONTINUE these medications which have NOT CHANGED   Details  albuterol  sulfate HFA (PROVENTIL ,VENTOLIN ,PROAIR ) 108 (90 Base) MCG/ACT inhaler Inhale two puffs into the lungs every 6 (six) hours as needed., Starting Tue 10/06/2023, Normal    aluminum hydroxide-magnesium  carbonate (GAVISCON) 95-358 mg/15 mL SUSP Suspension Take 15 mLs by mouth with meals and at bedtime as needed., Starting Fri 11/06/2023, Print    amLODIPine  besylate (NORVASC ) 10 mg tablet Take one tablet (10 mg dose) by mouth daily., Starting Thu 10/17/2021, Historical Med    apixaban  (ELIQUIS ) 5 mg tablet Take one tablet (5 mg dose) by mouth 2 (two) times daily., Starting Tue 12/29/2023, Normal    aspirin  (ASPIRIN ) 81 mg chewable tablet Chew one tablet (81 mg dose) by mouth daily., Historical Med    carvedilol  (COREG ) 12.5 mg tablet Take one tablet (12.5 mg dose) by mouth 2 (two) times daily., Starting Mon 07/06/2023, Normal  escitalopram  oxalate (LEXAPRO ) 20 mg tablet Take one tablet (20 mg dose) by mouth., Starting Wed 09/23/2023, Historical Med    famotidine (PEPCID) 40 MG tablet Take one tablet (40 mg dose) by mouth 3 (three) times a day as needed., Historical Med    ferric citrate  (AURYXIA) 1 g 210 mg (Fe) tablet Take 420 mg by mouth daily., Historical Med    ferrous sulfate 325 (65 FE) MG tablet Take one tablet (325 mg dose) by mouth with breakfast., Historical Med    finasteride (PROSCAR) 5 mg tablet Take two tablets (10 mg dose) by mouth daily.,  Historical Med    !! furosemide  (LASIX ) 40 mg tablet Take one tablet (40 mg dose) by mouth daily., Starting Mon 11/02/2023, Historical Med    !! furosemide  (LASIX ) 80 mg tablet Take one half tablet (40 mg dose) by mouth daily., Starting Tue 06/17/2022, Historical Med    hydrALAzine  HCl (APRESOLINE ) 25 mg tablet Take one tablet (25 mg dose) by mouth daily., Starting Sun 12/07/2022, Historical Med    isosorbide  mononitrate (IMDUR ) 30 mg 24 hr tablet Take one tablet (30 mg dose) by mouth daily., Starting Wed 01/21/2023, Historical Med    lidocaine  (LIDODERM ) 5% Place one patch onto the skin daily., Starting Wed 01/21/2023, Historical Med    methocarbamol  (ROBAXIN ) 500 MG tablet Take one tablet (500 mg dose) by mouth., Starting Wed 09/09/2023, Historical Med    !! mirtazapine  (REMERON ) 15 mg tablet Take one half tablet (7.5 mg dose) by mouth at bedtime., Starting Wed 03/11/2023, Historical Med    !! mirtazapine  (REMERON ) 30 mg tablet Take one tablet (30 mg dose) by mouth at bedtime., Historical Med    naloxone  (NARCAN ) nasal spray (TAKE HOME PACK) by Nasal route., Starting Wed 01/21/2023, Historical Med    omeprazole  (PRILOSEC) 40 mg capsule Take one capsule (40 mg dose) by mouth daily as needed., Starting Fri 06/06/2022, Historical Med    pantoprazole  sodium (PROTONIX ) 40 mg tablet Take one tablet (40 mg dose) by mouth 2 (two) times daily., Starting Fri 11/06/2023, Normal    polyethylene glycol (MIRALAX ) 17 g packet Take seventeen g by mouth daily as needed., Historical Med    rosuvastatin  calcium  (CRESTOR ) 10 mg tablet Take one tablet (10 mg dose) by mouth at bedtime., Starting Mon 07/06/2023, Normal    sevelamer  (RENVELA ) 800 MG tablet Take two tablets (1,600 mg dose) by mouth 3 (three) times daily with meals., Starting Tue 01/06/2023, Historical Med    sorbitol 70% solution Take 10 mLs by mouth daily as needed., Starting Tue 01/20/2023, Historical Med    tamsulosin  (FLOMAX ) 0.4 mg CAPS Take one  capsule (0.4 mg dose) by mouth every morning., Starting Thu 10/17/2021, Historical Med    !! tiotropium-olodaterol (STIOLTO RESPIMAT) 2.5-2.5 mcg/actuation inhaler Take two puffs by mouth., Starting Mon 07/13/2023, Historical Med    !! tiotropium-olodaterol (STIOLTO RESPIMAT) 2.5-2.5 mcg/actuation inhaler Take by mouth., Starting Fri 08/07/2023, Historical Med     !! - Potential duplicate medications found. Please discuss with provider.      Primary Survey  Primary Survey  Review of Systems   Review of Systems  Genitourinary:  Positive for difficulty urinating and dysuria.    Physical Exam   ED Triage Vitals [03/04/24 1016]  BP 129/80  Heart Rate 88  Resp 18  SpO2 95 %  Temp 97.1 F (36.2 C)    Physical Exam  Constitutional: He appears well-developed and well-nourished. He is in good hygiene. He has good hygiene. He appears  distressed, does not appear ill and no respiratory distress. Not diaphoretic. Patient constantly   HENT:  Head: Normocephalic and atraumatic.  Right Ear: Normal external ear.  Left Ear: Normal external ear.  Nose: Nose normal.  Mouth/Throat: Voice normal.  Eyes: EOM are intact. Pupils are equal, round, and reactive to light. Right eye: no drainage. no conjunctival injection. Left eye: no drainage. no conjunctival injection. No scleral icterus.  Neck: Normal range of motion and voice normal. Normal range of motion.  Cardiovascular: Normal rate.  Pulmonary/Chest: No respiratory distress. Not tachypneic. Respiratory effort normal.  Musculoskeletal: Normal range of motion.     Cervical back: Normal range of motion. Normal range of motion.   Neurological: He is alert and oriented to person, place, and time. Gait normal. He has normal speech. No facial weakness.Gait normal. GCS Total Score = 15.  , eye opening = 4 , verbal response = 5 , best motor response = 6 , pupil unreactive to light = 0  no facial droop.  Skin: Skin is not clammy. Skin is warm. Not  diaphoretic. Skin is dry. No rash noted. No pallor.  No cyanosis.  No obvious rash, abrasions/lacerations or lesions to exposed areas of skin.   Psychiatric: He has a normal mood and affect. His speech is normal. His behavior is normal.     ED Course   Lab results:   CBC AND DIFFERENTIAL - Abnormal      Result Value   WBC 3.8 (*)    RBC 5.18     HGB 13.9     HCT 41.4     MCV 79.9     MCH 26.8     MCHC 33.6     Plt Ct 122 (*)    RDW SD 49.7 (*)    MPV 11.7     NRBC% 0.0     Absolute NRBC Count 0.00     NEUTROPHIL % 68.7     LYMPHOCYTE % 16.8     MONOCYTE % 13.4     Eosinophil % 0.3     BASOPHIL % 0.8     IG% 0.0     ABSOLUTE NEUTROPHIL COUNT 2.63     ABSOLUTE LYMPHOCYTE COUNT 0.64 (*)    Absolute Monocyte Count 0.51     Absolute Eosinophil Count 0.01     Absolute Basophil Count 0.03     Absolute Immature Granulocyte Count 0.00    COMPREHENSIVE METABOLIC PANEL - Abnormal   Na 135 (*)    Potassium 4.3     Cl 94 (*)    CO2 19 (*)    AGAP 22 (*)    Glucose 123 (*)    BUN 58 (*)    Creatinine 8.05 (*)    Ca 9.5     ALK PHOS 135     T Bili 0.4     Total Protein 7.6     Alb 3.9     GLOBULIN 3.7     ALBUMIN /GLOBULIN RATIO 1.1     BUN/CREAT RATIO 7.2 (*)    ALT 12     AST 24     eGFR 7 (*)    Comment: Normal GFR (glomerular filtration rate) > 60 mL/min/1.73 meters squared, < 60 may include impaired kidney function. Calculation based on the Chronic Kidney Disease Epidemiology Collaboration (CK-EPI)equation refit without adjustment for race.  MAGNESIUM  - Abnormal   Mg 2.7 (*)     Imaging:   CT ABDOMEN PELVIS WO IV CONTRAST (STONE  PROTOCOL)   Narrative:    INDICATION: Right flank pain  COMPARISON: 11/06/2023  TECHNIQUE: Multiple contiguous helical slices were obtained from the domes of the diaphragms through the symphysis pubis without IV or oral contrast. Radiation dose reduction was utilized (automated exposure control, mA or kV adjustment based on patient  size, or iterative image reconstruction).  FINDINGS: Limited study secondary to lack of IV contrast and lack of intra-abdominal fat.  Cardiomegaly. Mild left basilar atelectasis. Distal aspect of a PermCath is partially visualized.  No liver masses on unenhanced imaging. No gallbladder distention. Spleen is normal in size. There is no peripancreatic fat stranding. Normal adrenal glands. Atrophic kidneys. No renal calculi. No hydronephrosis.  No evidence of bowel obstruction. The appendix is not visualized. The urinary bladder is partially filled.  Atherosclerotic abdominal aorta with no AAA. No appreciable adenopathy on unenhanced imaging. Mild free fluid, stable from previous exam. No acute osseous abnormality. Intact posterior fusion hardware L3/L4. Prior posterior decompression in the mid to lower lumbar spine. There is significant disc space narrowing at L2/L3 which has progressed from 11/06/2023.    Impression:    IMPRESSION: 1.No renal calculi or hydronephrosis. 2. Stable postop changes involving the lumbar spine. There is significant degenerative disc disease at L2/L3 which has progressed from March 2025.   Electronically Signed by: Charlie Redfern MD on 03/04/2024 12:23 PM      ECG: ECG Results   None                                                                     Pre-Sedation Procedures  ED Course as of 03/04/24 1256  Charmaine BRAVO Wilson's Documentation  Fri Mar 04, 2024  1221 Patient requesting for the third time to leave AGAINST MEDICAL ADVICE.  I tried to discuss with him that we do not have the results of his CT scan to see if he has true obstruction or if he simply is not making urine anymore as the result of his difficulty urinating.  Patient states that he would want to leave.  I tried to convince him to wait just a little bit longer to have the results and then be able to transfer him for his dialysis but patient states, and I  quote, Ma'am, give me my damn AMA form and let me leave.    Medical Decision Making Patient is a 68 year old male with a PMHx significant for end-stage renal disease on dialysis who presents the emergency department today with difficulty urinating.  Patient has requested multiple times to leave.  I tried having multiple conversations with him to emphasize the importance of further workup and dialysis but patient repeatedly states that he would like to leave.  He very clearly understands the risks and benefits of this.  Ronald Reeves wants to leave AGAINST MEDICAL ADVICE.  Spoke with patient extensively regarding the risk of the brain to completion of an appropriate work-up and interventions given her symptomatology.  We discussed possibility of significant morbidity and death that may occur as a result of them leaving. Ronald Reeves expressed understanding of the situation items mentioned, however would like to leave our care.  I have not determined that Ronald Reeves capacity for this medical decision.  I have discussed the risks  and benefits of the proposed treatment and treatment alternatives including nontreatment.  I have offered no limitation to the patient to return at any time for care.  I have determined that the patient understands this discussion and that the patient willingly assumes the potential risks to their wellbeing as a result of this informed refusal.   Amount and/or Complexity of Data Reviewed Labs: ordered. Decision-making details documented in ED Course. Radiology: ordered. Decision-making details documented in ED Course. ECG/medicine tests: ordered. Decision-making details documented in ED Course.  Risk Prescription drug management. Parenteral controlled substances. Decision regarding hospitalization.            Provider Communication  Discharge Medication List as of 03/04/2024 12:44 PM      Discharge Medication List as of 03/04/2024 12:44 PM      Discharge  Medication List as of 03/04/2024 12:44 PM      Clinical Impression Final diagnoses:  Urinary retention    ED Disposition     ED Disposition  AMA   Condition  --   Comment  Date: 03/04/2024 Patient: Ronald Reeves Admitted: 03/04/2024 10:02 AM Attending Provider: No att. providers found   Ronald Reeves has made the decision to leave the emergency department against the advice of his attending physician. Ronald Reeves has been  informed and demonstrates the capacity to understand the inherent risks, including death, and undiagnosed bladder obstruction. Ronald Reeves has decided to accept the responsibility for this decision. Ronald Reeves and all necessary parties have been advis ed that he may return for further evaluation or treatment. Ronald Reeves signed the Refusal of Care form. His condition at time of discharge was unchanged.  Ronald Reeves had current vital signs as follows: BP 129/80 (BP Location: Left Upper Arm, Patient  Position: Lying)   Pulse 88   Temp 97.1 F (36.2 C) (Axillary)   Resp 18   Ht 1.803 m (5' 11)   Wt 67.1 kg (148 lb)                     Electronically signed by:        [1] Social History Tobacco Use  Smoking Status Every Day  . Current packs/day: 0.50  . Average packs/day: 0.5 packs/day for 50.6 years (25.3 ttl pk-yrs)  . Types: Cigarettes, Cigars  . Start date: 68  . Passive exposure: Current  Smokeless Tobacco Never  [2] Allergies Allergen Reactions  . Gabapentin  Other    Encephalopathy, tremor  . Enalapril  Unknown and Other  . Iodinated Diagnostic Agents Nausea And Vomiting   Charmaine FORBES Blush, PA-C 03/04/24 1256
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
## 2024-03-07 NOTE — Progress Notes (Signed)
 Meds sent to [facility] reordered to local pharmacy. There is a sticky note on pt acct that meds can not go [facility] as pt does not have community care. Rcvd the following fax:

## 2024-03-08 ENCOUNTER — Emergency Department (HOSPITAL_COMMUNITY)

## 2024-03-08 ENCOUNTER — Emergency Department (HOSPITAL_COMMUNITY)
Admission: EM | Admit: 2024-03-08 | Discharge: 2024-03-08 | Disposition: A | Discharge status: Home / Self Care | Attending: Emergency Medicine | Admitting: Emergency Medicine

## 2024-03-08 ENCOUNTER — Encounter (HOSPITAL_COMMUNITY): Payer: Self-pay

## 2024-03-08 ENCOUNTER — Other Ambulatory Visit: Payer: Self-pay

## 2024-03-08 DIAGNOSIS — R4182 Altered mental status, unspecified: Secondary | ICD-10-CM | POA: Insufficient documentation

## 2024-03-08 DIAGNOSIS — N186 End stage renal disease: Secondary | ICD-10-CM | POA: Insufficient documentation

## 2024-03-08 DIAGNOSIS — Z7901 Long term (current) use of anticoagulants: Secondary | ICD-10-CM | POA: Insufficient documentation

## 2024-03-08 DIAGNOSIS — R0602 Shortness of breath: Secondary | ICD-10-CM | POA: Insufficient documentation

## 2024-03-08 DIAGNOSIS — R0600 Dyspnea, unspecified: Secondary | ICD-10-CM

## 2024-03-08 DIAGNOSIS — Z992 Dependence on renal dialysis: Secondary | ICD-10-CM | POA: Diagnosis not present

## 2024-03-08 DIAGNOSIS — Z5329 Procedure and treatment not carried out because of patient's decision for other reasons: Secondary | ICD-10-CM | POA: Insufficient documentation

## 2024-03-08 LAB — CBC WITH DIFFERENTIAL/PLATELET
Abs Immature Granulocytes: 0.01 K/uL (ref 0.00–0.07)
Basophils Absolute: 0 K/uL (ref 0.0–0.1)
Basophils Relative: 0 %
Eosinophils Absolute: 0 K/uL (ref 0.0–0.5)
Eosinophils Relative: 0 %
HCT: 43.8 % (ref 39.0–52.0)
Hemoglobin: 14.3 g/dL (ref 13.0–17.0)
Immature Granulocytes: 0 %
Lymphocytes Relative: 11 %
Lymphs Abs: 0.4 K/uL — ABNORMAL LOW (ref 0.7–4.0)
MCH: 26.9 pg (ref 26.0–34.0)
MCHC: 32.6 g/dL (ref 30.0–36.0)
MCV: 82.5 fL (ref 80.0–100.0)
Monocytes Absolute: 0.3 K/uL (ref 0.1–1.0)
Monocytes Relative: 8 %
Neutro Abs: 3.1 K/uL (ref 1.7–7.7)
Neutrophils Relative %: 81 %
Platelets: 115 K/uL — ABNORMAL LOW (ref 150–400)
RBC: 5.31 MIL/uL (ref 4.22–5.81)
RDW: 19.4 % — ABNORMAL HIGH (ref 11.5–15.5)
WBC: 3.9 K/uL — ABNORMAL LOW (ref 4.0–10.5)
nRBC: 0.5 % — ABNORMAL HIGH (ref 0.0–0.2)

## 2024-03-08 LAB — CBG MONITORING, ED: Glucose-Capillary: 111 mg/dL — ABNORMAL HIGH (ref 70–99)

## 2024-03-08 LAB — ETHANOL: Alcohol, Ethyl (B): <15 mg/dL (ref <15)

## 2024-03-08 MED ORDER — NALOXONE HCL 0.4 MG/ML IJ SOLN
0.4000 mg | Freq: Once | INTRAMUSCULAR | Status: AC
  Administered 2024-03-08: 0.4 mg via INTRAVENOUS
  Filled 2024-03-08: qty 1

## 2024-03-08 NOTE — ED Provider Notes (Incomplete Revision)
  Physical Exam  BP 103/89   Pulse 86   Resp 20   SpO2 100%   Physical Exam  Procedures  Procedures  ED Course / MDM    Medical Decision Making Amount and/or Complexity of Data Reviewed Labs: ordered. Radiology: ordered.  Risk Prescription drug management.   Pt comes in with cc of Shob. On O2 - CXR - overload. Nephrology consulted - needs HD. However, during the signout the nephrology team came in and indicated that patient has declined dialysis, despite her trying to explain that patient will potentially die if he doesn't.    2:56 PM Dr. Dasie and Nephrology team member tried to discuss with the patient the benefit of getting HD.  When I went over to assess the patient, patient was screaming at the staff,  that is what he means for.  Nephrology staff and Dr. Dasie were discussing with the wife, who wanted to IVC the patient.  However, as the conversation is happening, patient screaming to his wife that he does not want dialysis after 2 PM, that he understands he can die.  3:03 PM Dr. Dasie had already put AMA. He informed me that patient remains steadfast in his commitment to leaving without dialysis, however the wife has said that she will not pick him up. That it is best not to let patient leave unless someone is picking him up.  I went to introduce myself to the patient. He is in respiratory distress. He tells me he is leaving AMA and doesn't want to talk with anyone. I had told him that we have proceeded with AMA, but that I was made aware that his wife might not come to pick him up. That perhaps it is best to get HD now, so that he can have dinner at home. Patient responded, man, what the hell have I told you about dialysis today? To which I said, that you don't want it. Patient looks at me and says, end of story, don't ask about dialysis again. As I was leaving, patient said, that's what the cab is for.   I have indicated to the nurse that patient will need to  stay until someone comes to pick him up. We shouldn't leave him in the waiting room.  The patient during this brief encounter did clearly demonstrate good capacity.   3:14 PM RN indicated to me that patient is requesting IV to be removed. I have asked her to ask the patient to see if he okay with the iv remaining until someone is here to pick him up. If he declines, then she needs to document the conversation.   3:42 PM In and effort to go above and beyond for the patient, the nephrology team has secured a bed for dialysis first thing in the morning tomorrow.  They have communicated this to the wife.  Wife states that she will not be able to pick the patient up until 7 PM.

## 2024-03-08 NOTE — ED Notes (Signed)
 Wheeled patient to the bathroom patient is now back in room refusing to be put back on the monitor and refused his oxygen

## 2024-03-08 NOTE — ED Notes (Signed)
 This Clinical research associate along with Dr Dasie, Dr Charlyn, and a NP all explained the importance of the patient to stay and receive dialysis today. Patient is extremely short of breath, but states that he is not getting it today and that he is leaving. Wife was spoken with on the phone and wife states she is not coming to pick him up because she is scared for his life.

## 2024-03-08 NOTE — Progress Notes (Signed)
 Seen and examined patient at bedside in the ED. Patient needs urgent dialysis; however, he refused treatment to be done today. He states:  It is too late in the afternoon for dialysis and I don't want it done. I further explained to him of my concern with his breathing and the importance on proceeding with dialysis to stabilize his breathing and volume. Noted he frequently shortens his treatments at his outpatient HD center. He continues to refuse. I further discussed the consequence of not getting dialysis such as death. Myself, Dr. Dasie, Dr. Charlyn, and the ED RN were all in the room trying to reason with him. He continues to refuse HD for today. He states: I know without dialysis that I can die. We notified patient's wife to keep her aware of the situation. I reached out to our renal navigator. Patient is now set up to receive dialysis tomorrow morning at [facility]. Patient needs to be there at 5:45am to make his chair time. I notified patient's wife of this plan and she is in agreement. I notified both Dr. Charlyn and the ED RN to make them aware of updated plan. Plan for patient to be discharged later today when wife is able to pick him up.  Charmaine Piety, NP [facility]

## 2024-03-08 NOTE — ED Notes (Signed)
 Patient asked for IV to be removed, patient was asked if we could leave it until someone came to pick him up, but he refused.

## 2024-03-08 NOTE — ED Provider Notes (Addendum)
 [place] EMERGENCY DEPARTMENT AT [facility] Provider Note   CSN: [id number] Arrival date & time: 03/08/24  1203     Patient presents with: Shortness of Breath   Ronald Reeves is a 68 y.o. male.   68 year old male who presents with altered mental status.  Patient has history of ESRD and was scheduled for dialysis today.  Last dialysis was about 3 days ago.  Has had increasing shortness of breath.  Has had periods with labored breathing.  Was being driven by his daughter to his dialysis center when she stopped here instead.  No further history obtainable due to his current state       Prior to Admission medications   Medication Sig Start Date End Date Taking? Authorizing Provider  albuterol  (VENTOLIN  HFA) 108 (90 Base) MCG/ACT inhaler Inhale 2 puffs into the lungs every 6 (six) hours as needed (COPD).    [provider]  amLODipine  (NORVASC ) 10 MG tablet Take 1 tablet (10 mg total) by mouth daily. Patient taking differently: Take 10 mg by mouth daily. Hold for systolic BP <100 4/75/80   Pietro Redell RAMAN, MD  apixaban  (ELIQUIS ) 5 MG TABS tablet Take 1 tablet (5 mg total) by mouth 2 (two) times daily. 03/06/23   Atway, Rayann N, DO  ARIPiprazole  (ABILIFY ) 5 MG tablet Take 1 tablet (5 mg total) by mouth daily. 11/20/23   Ghimire, Donalda HERO, MD  B Complex-C-Folic Acid  (DIALYVITE  TABLET) TABS Take 1 tablet by mouth daily. 12/23/22   [provider]  carvedilol  (COREG ) 12.5 MG tablet Take 12.5 mg by mouth 2 (two) times daily with a meal.    [provider]  escitalopram  (LEXAPRO ) 20 MG tablet Take 0.5 tablets (10 mg total) by mouth at bedtime. 11/19/23   Ghimire, Donalda HERO, MD  ferric citrate  (AURYXIA) 1 GM 210 MG(Fe) tablet Take 210 mg by mouth 3 (three) times daily with meals.    [provider]  furosemide  (LASIX ) 40 MG tablet Take 40 mg by mouth daily.    [provider]  hydrALAZINE  (APRESOLINE ) 25 MG tablet Take 25 mg by mouth 2 (two)  times daily.    [provider]  isosorbide  mononitrate (IMDUR ) 30 MG 24 hr tablet Take 30 mg by mouth daily.    [provider]  lidocaine  (LIDODERM ) 5 % Place 2 patches onto the skin daily. Remove & Discard patch within 12 hours or as directed by MD    [provider]  naloxone  (NARCAN ) nasal spray 4 mg/0.1 mL Place 0.4 mg into the nose See admin instructions.  SPRAY 1 SPRAY INTO ONE NOSTRIL AS DIRECTED AS NEEDED FOR RESCUE FOR OPIOID OVERDOSE - CALL 911 IMMEDIATELY, ADMINISTER DOSE, THEN TURN PERSON ON SIDE - IF NO RESPONSE IN 2-3 MINUTES OR PERSON RESPONDS BUT RELAPSES, REPEAT USING A NEW SPRAY DEVICE AND SPRAY INTO THE OTHER NOSTRIL AS NEEDED FOR RESCUE 01/21/23   [provider]  omeprazole  (PRILOSEC) 40 MG capsule Take 1 capsule (40 mg total) by mouth daily. Patient taking differently: Take 40 mg by mouth daily as needed. 06/06/22   Kennedy-Smith, Colleen M, NP  ondansetron  (ZOFRAN -ODT) 8 MG disintegrating tablet Take 8 mg by mouth every 6 (six) hours. 11/06/23   [provider]  oxyCODONE -acetaminophen  (PERCOCET/ROXICET) 5-325 MG tablet Take 1 tablet by mouth every 4 (four) hours as needed for severe pain (pain score 7-10). 10/20/23   Magda Debby SAILOR, MD  PSYLLIUM PO Take by mouth. 09/03/23   [provider]  rosuvastatin  (CRESTOR ) 10 MG tablet Take 10 mg by mouth daily.    [provider]  sevelamer  carbonate (RENVELA ) 800 MG tablet Take 1,600 mg by mouth 3 (three) times daily with meals. 01/06/23   [provider]  sorbitol 70 % solution Take 30 mLs by mouth 3 (three) times daily as needed (constipation). 01/20/23   [provider]  tamsulosin  (FLOMAX ) 0.4 MG CAPS capsule Take 0.4 mg by mouth at bedtime.    [provider]  Tiotropium Bromide-Olodaterol 2.5-2.5 MCG/ACT AERS Take 2 puffs by mouth in the morning. 08/07/23   [provider]    Allergies: Enalapril , Gabapentin , and Iodinated contrast  media    Review of Systems  Unable to perform ROS: Mental status change    Updated Vital Signs BP (!) 126/91 (BP Location: Left Arm)   Pulse 86   Resp (!) 24   SpO2 100%   Physical Exam Vitals and nursing note reviewed.  Constitutional:      General: He is not in acute distress.    Appearance: Normal appearance. He is well-developed. He is not toxic-appearing.  HENT:     Head: Normocephalic and atraumatic.  Eyes:     General: Lids are normal.     Conjunctiva/sclera: Conjunctivae normal.     Pupils: Pupils are equal, round, and reactive to light.  Neck:     Thyroid: No thyroid mass.     Trachea: No tracheal deviation.  Cardiovascular:     Rate and Rhythm: Normal rate and regular rhythm.     Heart sounds: Normal heart sounds. No murmur heard.    No gallop.  Pulmonary:     Effort: No respiratory distress.     Breath sounds: Transmitted upper airway sounds present. No stridor. Decreased breath sounds and rhonchi present. No wheezing or rales.     Comments: Periodic tachypnea noted. Abdominal:     General: There is no distension.     Palpations: Abdomen is soft.     Tenderness: There is no abdominal tenderness. There is no rebound.  Musculoskeletal:        General: No tenderness. Normal range of motion.     Cervical back: Normal range of motion and neck supple.  Skin:    General: Skin is warm and dry.     Findings: No abrasion or rash.  Neurological:     Mental Status: He is alert and oriented to person, place, and time. Mental status is at baseline.     GCS: GCS eye subscore is 4. GCS verbal subscore is 5. GCS motor subscore is 6.     Cranial Nerves: No cranial nerve deficit.     Sensory: No sensory deficit.     Motor: Motor function is intact.  Psychiatric:        Attention and Perception: Attention normal.        Speech: Speech normal.        Behavior: Behavior normal.     (all labs ordered are listed, but only abnormal results are displayed) Labs Reviewed   CBG MONITORING, ED - Abnormal; Notable for the following components:      Result Value   Glucose-Capillary 111 (*)    All other components within normal limits  CBC WITH DIFFERENTIAL/PLATELET  BASIC METABOLIC PANEL WITH GFR  ETHANOL    EKG: EKG Interpretation Date/Time:  Tuesday March 08 2024 12:06:51 EDT Ventricular Rate:  89 PR Interval:  203 QRS Duration:  95 QT Interval:  396  QTC Calculation: 482 R Axis:   222  Text Interpretation: Sinus rhythm Probable lateral infarct, age indeterminate Anterior infarct, old No significant change since last tracing Confirmed by Dasie Faden ([id number]) on 03/08/2024 12:19:37 PM  Radiology: No results found.   Procedures   Medications Ordered in the ED  naloxone  (NARCAN ) injection 0.4 mg (has no administration in time range)                                    Medical Decision Making Amount and/or Complexity of Data Reviewed Labs: ordered. Radiology: ordered.  Risk Prescription drug management.   Patient is EKG shows sinus rhythm.  No signs of hyperkalemic changes.  Chest x-ray shows some fluid overloaded.  He does have oxygen requirement at this time.  I discussed the case with Dr. Dalene from nephrology who will evaluate the patient.  2:55 PM Patient now is refusing to be dialyzed.  He is alert oriented x 4.  He has capacity to make this decision.  This conversation was witnessed by my colleague Dr. Charlyn.  Spoke with his wife on the phone as well about this.  Patient understands that he could die if he leaves here.  He was strongly encouraged to return.  Nephrology nurse practitioner at bedside and will try to arrange a first available dialysis for the patient in the morning     Final diagnoses:  None    ED Discharge Orders     None          Dasie Faden, MD 03/08/24 1427    Dasie Faden, MD 03/08/24 1456

## 2024-03-08 NOTE — Consult Note (Signed)
 ESRD Consult Note  Requesting provider: [facility] Service requesting consult: ER Reason for consult: ESRD, provision of dialysis Indication for acute dialysis?: End Stage Renal Disease  Outpatient dialysis unit: [facility] Outpatient dialysis prescription: F180, BFR 400, EDW 64.7kg, 2k, 2ca, TDC, 2.36mcg calcitriol , 30mg  sensipar, sevelamer  2 tabs with meals  Assessment/Recommendations: Ronald Reeves is a/an 68 y.o. male with a past medical history notable for ESRD on HD presenting with shortness of breath  # ESRD: We tried to accommodate dialysis today but he refused. Arrange HD in AM at outpatient dialysis unit. His leaving is AMA and we recommend he stay in the hospital. He is aware of the risk of death by not complying with medical advice. Also discussed with family  # Volume/ hypertension: Leaving above EDW at outpatient unit due to signing off early. Continue home BP meds. Aggressive UF with HD   # Anemia of Chronic Kidney Disease: Not on ESA outpatient. CTM routinely.   # Secondary Hyperparathyroidism/Hyperphosphatemia: If he remains inpatient would continue home sensipar, calcitirol, and sevelamer   # Vascular access: No issues with TDC  # SOB: related to volume overload. Pulmonary infection also possible. Patient refusing to comply with medical advice. We arrange HD in AM outpatient to help with UF.   # Additional recommendations: - Dose all meds for creatinine clearance < 10 ml/min  - Unless absolutely necessary, no MRIs with gadolinium.  - Implement save arm precautions.  Prefer needle sticks in the dorsum of the hands or wrists.  No blood pressure measurements in arm. - If blood transfusion is requested during hemodialysis sessions, please alert us  prior to the session.  - Use synthetic opioids (Fentanyl /Dilaudid ) if needed  Recommendations were discussed with the primary team.   History of Present Illness: Ronald Reeves is a/an 68 y.o. male with a past medical history of ESRD  who presents with shortness of breath  Patient's history is that he has had shortness of breath for the last 24 hours or so. He said it started last night and has continued to today. He denies fevers, chills, cp, n/v/d. He said that he has had SOB in the past and this feels similar. There was report of AMS but on my interview the patient was oriented and later repeatedly expressed desire to leave the hospital and that he won't comply with getting dialysis today.  Work up in the ER was consistent with hypoxia and pulmonary edema.  We discussed with the patient that if he did not do dialysis he may not die. He was able to acknowledge that he would be okay with dying instead of getting dialysis today and so absolutely refused. We discussed this with his wife who wanted the patient kept in the hospital against his will but we discussed even if he was IVC'd we would not be able to perform dialysis against his will which would pose a major safety issue.  We arranged dialysis for the patient in the morning if he continues to choose to leave the hospital against medical advice.   Medications:  No current facility-administered medications for this encounter.   Current Outpatient Medications  Medication Sig Dispense Refill   albuterol  (VENTOLIN  HFA) 108 (90 Base) MCG/ACT inhaler Inhale 2 puffs into the lungs every 6 (six) hours as needed (COPD).     amLODipine  (NORVASC ) 10 MG tablet Take 1 tablet (10 mg total) by mouth daily. (Patient taking differently: Take 10 mg by mouth daily. Hold for systolic BP <100) 90 tablet 3  apixaban  (ELIQUIS ) 5 MG TABS tablet Take 1 tablet (5 mg total) by mouth 2 (two) times daily.     ARIPiprazole  (ABILIFY ) 5 MG tablet Take 1 tablet (5 mg total) by mouth daily. 30 tablet 1   B Complex-C-Folic Acid  (DIALYVITE  TABLET) TABS Take 1 tablet by mouth daily.     carvedilol  (COREG ) 12.5 MG tablet Take 12.5 mg by mouth 2 (two) times daily with a meal.     escitalopram  (LEXAPRO ) 20  MG tablet Take 0.5 tablets (10 mg total) by mouth at bedtime. 30 tablet 1   ferric citrate  (AURYXIA) 1 GM 210 MG(Fe) tablet Take 210 mg by mouth 3 (three) times daily with meals.     furosemide  (LASIX ) 40 MG tablet Take 40 mg by mouth daily.     hydrALAZINE  (APRESOLINE ) 25 MG tablet Take 25 mg by mouth 2 (two) times daily.     isosorbide  mononitrate (IMDUR ) 30 MG 24 hr tablet Take 30 mg by mouth daily.     lidocaine  (LIDODERM ) 5 % Place 2 patches onto the skin daily. Remove & Discard patch within 12 hours or as directed by MD     naloxone  (NARCAN ) nasal spray 4 mg/0.1 mL Place 0.4 mg into the nose See admin instructions.  SPRAY 1 SPRAY INTO ONE NOSTRIL AS DIRECTED AS NEEDED FOR RESCUE FOR OPIOID OVERDOSE - CALL 911 IMMEDIATELY, ADMINISTER DOSE, THEN TURN PERSON ON SIDE - IF NO RESPONSE IN 2-3 MINUTES OR PERSON RESPONDS BUT RELAPSES, REPEAT USING A NEW SPRAY DEVICE AND SPRAY INTO THE OTHER NOSTRIL AS NEEDED FOR RESCUE     omeprazole  (PRILOSEC) 40 MG capsule Take 1 capsule (40 mg total) by mouth daily. (Patient taking differently: Take 40 mg by mouth daily as needed.) 30 capsule 2   ondansetron  (ZOFRAN -ODT) 8 MG disintegrating tablet Take 8 mg by mouth every 6 (six) hours.     oxyCODONE -acetaminophen  (PERCOCET/ROXICET) 5-325 MG tablet Take 1 tablet by mouth every 4 (four) hours as needed for severe pain (pain score 7-10). 30 tablet 0   PSYLLIUM PO Take by mouth.     rosuvastatin  (CRESTOR ) 10 MG tablet Take 10 mg by mouth daily.     sevelamer  carbonate (RENVELA ) 800 MG tablet Take 1,600 mg by mouth 3 (three) times daily with meals.     sorbitol 70 % solution Take 30 mLs by mouth 3 (three) times daily as needed (constipation).     tamsulosin  (FLOMAX ) 0.4 MG CAPS capsule Take 0.4 mg by mouth at bedtime.     Tiotropium Bromide-Olodaterol 2.5-2.5 MCG/ACT AERS Take 2 puffs by mouth in the morning.       ALLERGIES Enalapril , Gabapentin , and Iodinated contrast media  MEDICAL HISTORY Past Medical History:   Diagnosis Date   Anemia    Anxiety    Arthritis    Back pain    Bronchitis    COPD (chronic obstructive pulmonary disease) (HCC)    Coronary artery disease    COVID    mild - flu like symptoms   Dyspnea    w/ exertion, uses inhaler   ESRD on hemodialysis (HCC)    dialysis on tues, thurs, sat at [facility]   GERD (gastroesophageal reflux disease)    not a current problem   Heart murmur    never has caused any problems   Hypertension    Myocardial infarction (HCC)    Pneumonia    x 1   Pre-diabetes    diet controlled, no meds, does not check blood sugar  Substance abuse (HCC)      SOCIAL HISTORY Social History   Socioeconomic History   Marital status: Married    Spouse name: Not on file   Number of children: Not on file   Years of education: Not on file   Highest education level: Not on file  Occupational History   Not on file  Tobacco Use   Smoking status: Every Day    Current packs/day: 0.50    Types: Cigars, Cigarettes    Last attempt to quit: 2019    Passive exposure: Past   Smokeless tobacco: Never   Tobacco comments:    1-2 per day -  black and mild cigars  Vaping Use   Vaping status: Never Used  Substance and Sexual Activity   Alcohol use: Not Currently    Alcohol/week: 14.0 standard drinks of alcohol    Types: 14 Cans of beer per week    Comment: Reports quit 8 months ago   Drug use: Yes    Frequency: 1.0 times per week    Types: Marijuana, Cocaine     Comment: Cocaine  and cannabis daily for 30 years   Sexual activity: Yes  Other Topics Concern   Not on file  Social History Narrative   Not on file   Social Drivers of Health   Financial Resource Strain: Low Risk  (02/25/2024)   Overall Financial Resource Strain (CARDIA)    Difficulty of Paying Living Expenses: Not very hard  Food Insecurity: No Food Insecurity (02/25/2024)   Hunger Vital Sign    Worried About Running Out of Food in the Last Year: Never true    Ran Out of Food in the Last Year:  Never true  Transportation Needs: No Transportation Needs (02/25/2024)   PRAPARE - Administrator, Civil Service (Medical): No    Lack of Transportation (Non-Medical): No  Physical Activity: Not on file  Stress: No Stress Concern Present (05/22/2023)   Received from [facility] - Occupational Stress Questionnaire    Feeling of Stress : Not at all  Social Connections: Patient Declined (12/07/2023)   Social Connection and Isolation Panel    Frequency of Communication with Friends and Family: Patient declined    Frequency of Social Gatherings with Friends and Family: Patient declined    Attends Religious Services: Patient declined    Database administrator or Organizations: Patient declined    Attends Banker Meetings: Patient declined    Marital Status: Patient declined  Intimate Partner Violence: Not At Risk (03/04/2024)   Received from [facility]   HITS    Over the last 12 months how often did your partner physically hurt you?: Never    Over the last 12 months how often did your partner insult you or talk down to you?: Never    Over the last 12 months how often did your partner threaten you with physical harm?: Never    Over the last 12 months how often did your partner scream or curse at you?: Never     FAMILY HISTORY Family History  Problem Relation Age of Onset   Heart disease Mother    Heart attack Sister 25     Review of Systems: 12 systems were reviewed and negative except per HPI  Physical Exam: Vitals:   03/08/24 1345 03/08/24 1400  BP: 133/74 103/89  Pulse:    Resp: (!) 0 20  SpO2:     No intake/output  data recorded. No intake or output data in the 24 hours ending 03/08/24 1651 General: ill-appearing, mild acute distress HEENT: anicteric sclera, MMM CV: normal rate, no murmurs, no edema Lungs: bilateral chest rise, iwob, tachypnea Abd: soft, non-tender, non-distended Skin: no visible  lesions or rashes Psych: alert, engaged, appropriate mood and affect Neuro: normal speech, no gross focal deficits   Test Results Reviewed Lab Results  Component Value Date   NA 135 01/24/2024   K 3.8 01/24/2024   CL 91 (L) 01/24/2024   CO2 26 01/24/2024   BUN 33 (H) 01/24/2024   CREATININE 6.34 (H) 01/24/2024   GFR 12.22 (LL) 06/06/2022   CALCIUM  8.8 (L) 01/24/2024   ALBUMIN  3.1 (L) 01/24/2024   PHOS 3.9 11/16/2023    I have reviewed relevant outside healthcare records

## 2024-03-08 NOTE — ED Provider Notes (Addendum)
  Physical Exam  BP 103/89   Pulse 86   Resp 20   SpO2 100%   Physical Exam  Procedures  Procedures  ED Course / MDM    Medical Decision Making Amount and/or Complexity of Data Reviewed Labs: ordered. Radiology: ordered.  Risk Prescription drug management.   Pt comes in with cc of Shob. On O2 - CXR - overload. Nephrology consulted - needs HD. However, during the signout the nephrology team came in and indicated that patient has declined dialysis, despite her trying to explain that patient will potentially die if he doesn't.    Charlyn Sora, MD 03/08/24 [phone]

## 2024-03-08 NOTE — ED Triage Notes (Signed)
 Pt arrives to ED via EMS while on the way to dialysis with his daughter the patient became extremely short of breath. Pt daughter called 911 upon EMS arrival pt was found to have O2 sat in the 80's on RA.

## 2024-03-08 NOTE — Progress Notes (Signed)
 Contacted by renal NP with request for pt to receive out-pt HD treatment tomorrow am at pt's clinic. Contacted [facility] and spoke to charge RN. Clinic can treat pt tomorrow and pt will need to arrive at 5:45 am. This info was provided to renal NP who will provide update to other providers and pt's wife.   Randine Mungo Dialysis Navigator [phone]

## 2024-03-14 ENCOUNTER — Telehealth: Payer: Self-pay | Admitting: Licensed Clinical Social Worker

## 2024-03-14 NOTE — Telephone Encounter (Signed)
 H&V Care Navigation CSW Progress Note  Clinical Social Worker contacted patient by phone to f/u on community resources sent to pt regarding home repairs/community support options. No answer today at [phone], left voicemail encouraging pt to reach out if any other questions/resources they may be interested in. Remain available as needed.  Patient is participating in a Managed Medicaid Plan:  No, BCBS Medicare/VA  SDOH Screenings   Food Insecurity: No Food Insecurity (02/25/2024)  Housing: Low Risk  (02/25/2024)  Transportation Needs: No Transportation Needs (02/25/2024)  Utilities: Not At Risk (02/25/2024)  Financial Resource Strain: Low Risk  (02/25/2024)  Social Connections: Patient Declined (12/07/2023)  Stress: No Stress Concern Present (05/22/2023)   Received from [facility]  Tobacco Use: High Risk (03/08/2024)  Health Literacy: Adequate Health Literacy (02/25/2024)     Marit Lark, MSW, LCSW Clinical Social Worker II [facility] Heart[facility]Vascular Care Navigation  [phone]- work cell phone (preferred)

## 2024-03-17 ENCOUNTER — Emergency Department (HOSPITAL_COMMUNITY)
Admission: EM | Admit: 2024-03-17 | Discharge: 2024-03-17 | Disposition: A | Discharge status: Home / Self Care | Attending: Emergency Medicine | Admitting: Emergency Medicine

## 2024-03-17 ENCOUNTER — Emergency Department (HOSPITAL_COMMUNITY)

## 2024-03-17 ENCOUNTER — Other Ambulatory Visit: Payer: Self-pay

## 2024-03-17 ENCOUNTER — Encounter (HOSPITAL_COMMUNITY): Payer: Self-pay | Admitting: Emergency Medicine

## 2024-03-17 DIAGNOSIS — I120 Hypertensive chronic kidney disease with stage 5 chronic kidney disease or end stage renal disease: Secondary | ICD-10-CM | POA: Diagnosis not present

## 2024-03-17 DIAGNOSIS — N186 End stage renal disease: Secondary | ICD-10-CM | POA: Diagnosis not present

## 2024-03-17 DIAGNOSIS — Z992 Dependence on renal dialysis: Secondary | ICD-10-CM | POA: Insufficient documentation

## 2024-03-17 DIAGNOSIS — I2510 Atherosclerotic heart disease of native coronary artery without angina pectoris: Secondary | ICD-10-CM | POA: Insufficient documentation

## 2024-03-17 DIAGNOSIS — R0602 Shortness of breath: Secondary | ICD-10-CM | POA: Diagnosis present

## 2024-03-17 DIAGNOSIS — Z7901 Long term (current) use of anticoagulants: Secondary | ICD-10-CM | POA: Diagnosis not present

## 2024-03-17 DIAGNOSIS — Z79899 Other long term (current) drug therapy: Secondary | ICD-10-CM | POA: Diagnosis not present

## 2024-03-17 DIAGNOSIS — J449 Chronic obstructive pulmonary disease, unspecified: Secondary | ICD-10-CM | POA: Insufficient documentation

## 2024-03-17 LAB — COMPREHENSIVE METABOLIC PANEL WITH GFR
ALT: 21 U/L (ref 0–44)
AST: 23 U/L (ref 15–41)
Albumin: 3.2 g/dL — ABNORMAL LOW (ref 3.5–5.0)
Alkaline Phosphatase: 127 U/L — ABNORMAL HIGH (ref 38–126)
Anion gap: 17 — ABNORMAL HIGH (ref 5–15)
BUN: 77 mg/dL — ABNORMAL HIGH (ref 8–23)
CO2: 24 mmol/L (ref 22–32)
Calcium: 7.9 mg/dL — ABNORMAL LOW (ref 8.9–10.3)
Chloride: 90 mmol/L — ABNORMAL LOW (ref 98–111)
Creatinine, Ser: 7.58 mg/dL — ABNORMAL HIGH (ref 0.61–1.24)
GFR, Estimated: 7 mL/min — ABNORMAL LOW (ref >60)
Glucose, Bld: 134 mg/dL — ABNORMAL HIGH (ref 70–99)
Potassium: 4.7 mmol/L (ref 3.5–5.1)
Sodium: 131 mmol/L — ABNORMAL LOW (ref 135–145)
Total Bilirubin: 0.6 mg/dL (ref 0.0–1.2)
Total Protein: 6.6 g/dL (ref 6.5–8.1)

## 2024-03-17 LAB — I-STAT VENOUS BLOOD GAS, ED
Acid-base deficit: 2 mmol/L (ref 0.0–2.0)
Bicarbonate: 25.7 mmol/L (ref 20.0–28.0)
Calcium, Ion: 0.96 mmol/L — ABNORMAL LOW (ref 1.15–1.40)
HCT: 49 % (ref 39.0–52.0)
Hemoglobin: 16.7 g/dL (ref 13.0–17.0)
O2 Saturation: 45 %
Potassium: 4.8 mmol/L (ref 3.5–5.1)
Sodium: 131 mmol/L — ABNORMAL LOW (ref 135–145)
TCO2: 27 mmol/L (ref 22–32)
pCO2, Ven: 51.5 mmHg (ref 44–60)
pH, Ven: 7.307 (ref 7.25–7.43)
pO2, Ven: 28 mmHg — CL (ref 32–45)

## 2024-03-17 LAB — CBC
HCT: 44.7 % (ref 39.0–52.0)
Hemoglobin: 14.5 g/dL (ref 13.0–17.0)
MCH: 26.6 pg (ref 26.0–34.0)
MCHC: 32.4 g/dL (ref 30.0–36.0)
MCV: 81.9 fL (ref 80.0–100.0)
Platelets: 120 K/uL — ABNORMAL LOW (ref 150–400)
RBC: 5.46 MIL/uL (ref 4.22–5.81)
RDW: 17.6 % — ABNORMAL HIGH (ref 11.5–15.5)
WBC: 4 K/uL (ref 4.0–10.5)
nRBC: 0.8 % — ABNORMAL HIGH (ref 0.0–0.2)

## 2024-03-17 LAB — BRAIN NATRIURETIC PEPTIDE: B Natriuretic Peptide: >4500 pg/mL — ABNORMAL HIGH (ref 0.0–100.0)

## 2024-03-17 MED ORDER — ALBUTEROL SULFATE HFA 108 (90 BASE) MCG/ACT IN AERS
2.0000 | INHALATION_SPRAY | RESPIRATORY_TRACT | Status: DC
  Administered 2024-03-17: 2 via RESPIRATORY_TRACT

## 2024-03-17 MED ORDER — ALBUTEROL SULFATE HFA 108 (90 BASE) MCG/ACT IN AERS
2.0000 | INHALATION_SPRAY | RESPIRATORY_TRACT | Status: DC
  Filled 2024-03-17: qty 6.7

## 2024-03-17 MED ORDER — FENTANYL CITRATE PF 50 MCG/ML IJ SOSY
50.0000 ug | PREFILLED_SYRINGE | Freq: Once | INTRAMUSCULAR | Status: AC
  Administered 2024-03-17: 50 ug via INTRAVENOUS
  Filled 2024-03-17: qty 1

## 2024-03-17 NOTE — Discharge Instructions (Signed)
 As we discussed, I believe that you are in need of dialysis.  Fortunately, you have dialysis today at 11 AM.  All of your laboratory evaluation here appears to be the baseline.  Please proceed to dialysis at 11 AM today.  Please discontinue use of Cocaine .  Please follow-up regarding your tibial plateau fracture with orthopedics tomorrow morning as discussed.  Return to the ED with any worsening symptoms.

## 2024-03-17 NOTE — ED Provider Notes (Signed)
 [place] EMERGENCY DEPARTMENT AT [facility] Provider Note   CSN: [id number] Arrival date & time: 03/17/24  [id number]     Patient presents with: Shortness of Breath   Ronald Reeves is a 68 y.o. 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  Patient presents to the ED for evaluation.  Reports that he has been feeling short of breath for the last couple of hours.  Patient focused mainly on knee pain and right knee which appears to be in orthotic device.  States that he has a fractured patella.  Denies chest pain, leg swelling.  Reports he last went to dialysis on Tuesday for a full session.  Endorsing nausea, vomiting but denies fevers or abdominal pain at home.  Denies diarrhea.  Very hard to get patient to focus on interview.  States he smoked crack cocaine  1 hour prior to arrival.  Reports he smoked about 1 g over the course of the last 5 hours prior to arrival.    Shortness of Breath      Prior to Admission medications   Medication Sig Start Date End Date Taking? Authorizing Provider  albuterol  (VENTOLIN  HFA) 108 (90 Base) MCG/ACT inhaler Inhale 2 puffs into the lungs every 6 (six) hours as needed (COPD).    [provider]  amLODipine  (NORVASC ) 10 MG tablet Take 1 tablet (10 mg total) by mouth daily. Patient taking differently: Take 10 mg by mouth daily. Hold for systolic BP <100 4/75/80   Pietro Redell RAMAN, MD  apixaban  (ELIQUIS ) 5 MG TABS tablet Take 1 tablet (5 mg total) by mouth 2 (two) times daily. 03/06/23   Atway, Rayann N, DO  ARIPiprazole  (ABILIFY ) 5 MG tablet Take 1 tablet (5 mg total) by mouth daily. 11/20/23   Ghimire, Donalda HERO, MD  B Complex-C-Folic Acid  (DIALYVITE  TABLET) TABS Take 1 tablet by mouth daily. 12/23/22   [provider]  carvedilol  (COREG ) 12.5 MG tablet Take 12.5 mg by mouth 2 (two) times daily with a meal.    [provider]  escitalopram  (LEXAPRO ) 20 MG tablet Take 0.5 tablets (10 mg total) by mouth at bedtime. 11/19/23   Ghimire, Donalda HERO, MD  ferric citrate  (AURYXIA) 1 GM 210 MG(Fe) tablet Take 210 mg by mouth 3 (three) times daily with meals.    [provider]  furosemide  (LASIX ) 40 MG tablet Take 40 mg by mouth daily.    [provider]  hydrALAZINE  (APRESOLINE ) 25 MG tablet Take 25 mg by mouth 2 (two) times daily.    [provider]  isosorbide  mononitrate (IMDUR ) 30 MG 24 hr tablet Take 30 mg by mouth daily.    [provider]  lidocaine  (LIDODERM ) 5 % Place 2 patches onto the skin daily. Remove & Discard patch within 12 hours or as directed by MD    [provider]  naloxone  (NARCAN ) nasal spray 4 mg/0.1 mL Place 0.4 mg into the nose See admin instructions.  SPRAY 1 SPRAY INTO ONE NOSTRIL AS DIRECTED AS NEEDED FOR RESCUE FOR OPIOID OVERDOSE - CALL 911 IMMEDIATELY, ADMINISTER DOSE, THEN TURN PERSON ON SIDE - IF NO RESPONSE IN 2-3 MINUTES OR PERSON RESPONDS BUT RELAPSES, REPEAT USING A NEW SPRAY DEVICE AND SPRAY INTO THE OTHER NOSTRIL AS NEEDED FOR RESCUE 01/21/23   [provider]  omeprazole  (PRILOSEC) 40 MG capsule Take 1 capsule (40 mg total) by mouth daily. Patient taking differently: Take 40  mg by mouth daily as needed. 06/06/22   Kennedy-Smith, Colleen M, NP  ondansetron  (ZOFRAN -ODT) 8 MG disintegrating tablet Take 8 mg by mouth every 6 (six) hours. 11/06/23   [provider]  oxyCODONE -acetaminophen  (PERCOCET/ROXICET) 5-325 MG tablet Take 1 tablet by mouth every 4 (four) hours as needed for severe pain (pain score 7-10). 10/20/23   Magda Debby SAILOR, MD  PSYLLIUM PO Take by mouth. 09/03/23   [provider]  rosuvastatin  (CRESTOR ) 10 MG tablet Take 10 mg by mouth daily.    [provider]  sevelamer  carbonate (RENVELA ) 800 MG tablet Take 1,600 mg by mouth 3 (three) times daily with meals. 01/06/23   [provider]   sorbitol 70 % solution Take 30 mLs by mouth 3 (three) times daily as needed (constipation). 01/20/23   [provider]  tamsulosin  (FLOMAX ) 0.4 MG CAPS capsule Take 0.4 mg by mouth at bedtime.    [provider]  Tiotropium Bromide-Olodaterol 2.5-2.5 MCG/ACT AERS Take 2 puffs by mouth in the morning. 08/07/23   [provider]    Allergies: Enalapril , Gabapentin , and Iodinated contrast media    Review of Systems  Respiratory:  Positive for shortness of breath.     Updated Vital Signs BP 119/84   Pulse 70   Temp (!) 97.4 F (36.3 C) (Oral)   Resp (!) 24   Ht 5' 11 (1.803 m)   Wt 68 kg   SpO2 95%   BMI 20.91 kg/m   Physical Exam Vitals and nursing note reviewed.  Constitutional:      General: He is not in acute distress.    Appearance: He is well-developed.  HENT:     Head: Normocephalic and atraumatic.  Eyes:     Conjunctiva/sclera: Conjunctivae normal.  Cardiovascular:     Rate and Rhythm: Normal rate and regular rhythm.     Heart sounds: No murmur heard. Pulmonary:     Effort: Pulmonary effort is normal. No respiratory distress.     Breath sounds: Normal breath sounds.  Abdominal:     Palpations: Abdomen is soft.     Tenderness: There is no abdominal tenderness.  Musculoskeletal:        General: No swelling.     Cervical back: Neck supple.  Skin:    General: Skin is warm and dry.     Capillary Refill: Capillary refill takes less than 2 seconds.  Neurological:     Mental Status: He is alert.  Psychiatric:        Mood and Affect: Mood normal.     (all labs ordered are listed, but only abnormal results are displayed) Labs Reviewed  CBC - Abnormal; Notable for the following components:      Result Value   RDW 17.6 (*)    Platelets 120 (*)    nRBC 0.8 (*)    All other components within normal limits  COMPREHENSIVE METABOLIC PANEL WITH GFR - Abnormal; Notable for the following components:   Sodium 131 (*)    Chloride 90 (*)     Glucose, Bld 134 (*)    BUN 77 (*)    Creatinine, Ser 7.58 (*)    Calcium  7.9 (*)    Albumin  3.2 (*)    Alkaline Phosphatase 127 (*)    GFR, Estimated 7 (*)    Anion gap 17 (*)    All other components within normal limits  BRAIN NATRIURETIC PEPTIDE - Abnormal; Notable for the following components:   B Natriuretic Peptide >  4,500.0 (*)    All other components within normal limits  I-STAT VENOUS BLOOD GAS, ED - Abnormal; Notable for the following components:   pO2, Ven 28 (*)    Sodium 131 (*)    Calcium , Ion 0.96 (*)    All other components within normal limits    EKG: EKG Interpretation Date/Time:  Thursday March 17 2024 04:37:51 EDT Ventricular Rate:  76 PR Interval:  202 QRS Duration:  111 QT Interval:  422 QTC Calculation: 475 R Axis:   -83  Text Interpretation: Sinus rhythm Probable left atrial enlargement Left anterior fascicular block Probable anterolateral infarct, age indeterm No significant change since last tracing Confirmed by Midge Golas ([id number]) on 03/17/2024 5:31:23 AM  Radiology: DG Chest Portable 1 View Result Date: 03/17/2024 EXAM: 1 VIEW XRAY OF THE CHEST 03/17/2024 04:59:00 AM COMPARISON: 1 view chest x-ray 03/08/2024. CLINICAL HISTORY: Shortness of breath. FINDINGS: LUNGS AND PLEURA: Diffuse interstitial edema and small bilateral pleural effusions have increased. HEART AND MEDIASTINUM: Cardiomegaly is stable. BONES AND SOFT TISSUES: No acute osseous abnormality. A right IJ dialysis catheter is stable in position. Atherosclerotic changes are present at the aortic arch. IMPRESSION: 1. Stable cardiomegaly. 2. Increased diffuse interstitial edema and small bilateral pleural effusions consistent with congestive heart failure. Electronically signed by: [name] Necessary MD 03/17/2024 05:06 AM EDT RP Workstation: [id number]     Procedures   Medications Ordered in the ED  fentaNYL  (SUBLIMAZE ) injection 50 mcg (50 mcg Intravenous Given 03/17/24 0448)      Medical Decision Making Amount and/or Complexity of Data Reviewed Labs: ordered. Radiology: ordered. ECG/medicine tests: ordered.  Risk Prescription drug management.   This is a 68 year old male with ESRD Tuesday Thursday Saturday who presents to the ED for shortness of breath.  Patient reports that he smoked about 1 g of crack cocaine  over 5 hours prior to arrival.  On examination, patient is afebrile and nontachycardic.  His lung sounds are clear bilaterally, no hypoxia.  Abdomen soft and compressible.  Neurological examination at baseline.  Basic labs collected, BMP, i-STAT VBG.  CBC shows no leukocytosis or anemia.  Metabolic panel with sodium 131, glucose 134, BUN 77, creatinine 7.58, alk phos 127 in the setting of recent alcohol use, anion gap 17.  Patient BNP greater than 4500 however this is chronic.  I-STAT BG unremarkable.  Chest x-ray shows cardiomegaly as well as bilateral pleural effusions.  EKG is nonischemic.  Patient given 50 mcg fentanyl  for pain control of right tibial plateau fracture.  Discussed with patient that he needs dialysis.  Patient reports that he is set for dialysis today at 11 AM at his dialysis center.  I advised patient to continue with regular scheduled dialysis.  He is in agreement with this plan.  His wife is at the bedside and states that she will take him to dialysis later today.  I discussed the patient with Dr. Midge, my attending, who voices agreement with plan of management.  Patient discharged in stable condition.    Final diagnoses:  SOB (shortness of breath)    ED Discharge Orders     None          Ronald Reeves 03/17/24 [id number]    Midge Golas, MD 03/17/24 0630

## 2024-03-17 NOTE — ED Triage Notes (Signed)
 Pt to ED from home via [facility] c/o SOB that started around midnight, used albuterol  inhaler a couple times at home without relief.  EMS administered 2 duoneb en route.  Pt also dialysis pt, T/TH/Sat and last went Tuesday.  Right chest port.  Pt reports using crack approx 1hr PTA.
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
## 2024-03-27 ENCOUNTER — Emergency Department (HOSPITAL_COMMUNITY)
Admission: EM | Admit: 2024-03-27 | Discharge: 2024-03-27 | Disposition: A | Discharge status: Home / Self Care | Attending: Emergency Medicine | Admitting: Emergency Medicine

## 2024-03-27 ENCOUNTER — Other Ambulatory Visit: Payer: Self-pay

## 2024-03-27 DIAGNOSIS — R339 Retention of urine, unspecified: Secondary | ICD-10-CM | POA: Diagnosis present

## 2024-03-27 DIAGNOSIS — R310 Gross hematuria: Secondary | ICD-10-CM | POA: Diagnosis not present

## 2024-03-27 DIAGNOSIS — N186 End stage renal disease: Secondary | ICD-10-CM | POA: Insufficient documentation

## 2024-03-27 DIAGNOSIS — Z7951 Long term (current) use of inhaled steroids: Secondary | ICD-10-CM | POA: Diagnosis not present

## 2024-03-27 DIAGNOSIS — J449 Chronic obstructive pulmonary disease, unspecified: Secondary | ICD-10-CM | POA: Diagnosis not present

## 2024-03-27 DIAGNOSIS — Z7901 Long term (current) use of anticoagulants: Secondary | ICD-10-CM | POA: Diagnosis not present

## 2024-03-27 DIAGNOSIS — Z992 Dependence on renal dialysis: Secondary | ICD-10-CM | POA: Diagnosis not present

## 2024-03-27 NOTE — ED Triage Notes (Signed)
 Patient brought in by EMS from home. Patient was released from [facility] [place] on yesterday. Patient pulled his foley out while at [facility] which was not reinserted. Patient is currently on Eliquis  and states he have been bleeding from his penis since then. Patient alert and oriented x4.  Pain to area 8/10 Patient is dialysis T,Th, Sat

## 2024-03-27 NOTE — ED Provider Notes (Signed)
 [place] EMERGENCY DEPARTMENT AT [facility] Provider Note   CSN: [id number] Arrival date & time: 03/27/24  9480     Patient presents with: bleeding from penis   Binh Doten is a 68 y.o. male.   The history is provided by the patient.   Patient with extensive history including ESRD, substance use disorder presents from penile bleeding Patient was just at [facility] and while inpatient he had a Foley catheter that he pulled out.  He was just discharged home, and EMS was called tonight due to bleeding from his penis and penile pain Patient goes to dialysis Tuesday Thursday Saturday without any recently missed sessions Patient is on anticoagulation No other acute complaint    Prior to Admission medications   Medication Sig Start Date End Date Taking? Authorizing Provider  albuterol  (VENTOLIN  HFA) 108 (90 Base) MCG/ACT inhaler Inhale 2 puffs into the lungs every 6 (six) hours as needed (COPD).    [provider]  amLODipine  (NORVASC ) 10 MG tablet Take 1 tablet (10 mg total) by mouth daily. Patient taking differently: Take 10 mg by mouth daily. Hold for systolic BP <100 4/75/80   Pietro Redell RAMAN, MD  apixaban  (ELIQUIS ) 5 MG TABS tablet Take 1 tablet (5 mg total) by mouth 2 (two) times daily. 03/06/23   Atway, Rayann N, DO  ARIPiprazole  (ABILIFY ) 5 MG tablet Take 1 tablet (5 mg total) by mouth daily. 11/20/23   Ghimire, Donalda HERO, MD  B Complex-C-Folic Acid  (DIALYVITE  TABLET) TABS Take 1 tablet by mouth daily. 12/23/22   [provider]  carvedilol  (COREG ) 12.5 MG tablet Take 12.5 mg by mouth 2 (two) times daily with a meal.    [provider]  escitalopram  (LEXAPRO ) 20 MG tablet Take 0.5 tablets (10 mg total) by mouth at bedtime. 11/19/23   Ghimire, Donalda HERO, MD  ferric citrate  (AURYXIA) 1 GM 210 MG(Fe) tablet Take 210 mg by mouth 3 (three) times daily with meals.    [provider]  furosemide  (LASIX ) 40 MG tablet Take 40  mg by mouth daily.    [provider]  hydrALAZINE  (APRESOLINE ) 25 MG tablet Take 25 mg by mouth 2 (two) times daily.    [provider]  isosorbide  mononitrate (IMDUR ) 30 MG 24 hr tablet Take 30 mg by mouth daily.    [provider]  lidocaine  (LIDODERM ) 5 % Place 2 patches onto the skin daily. Remove & Discard patch within 12 hours or as directed by MD    [provider]  naloxone  (NARCAN ) nasal spray 4 mg/0.1 mL Place 0.4 mg into the nose See admin instructions.  SPRAY 1 SPRAY INTO ONE NOSTRIL AS DIRECTED AS NEEDED FOR RESCUE FOR OPIOID OVERDOSE - CALL 911 IMMEDIATELY, ADMINISTER DOSE, THEN TURN PERSON ON SIDE - IF NO RESPONSE IN 2-3 MINUTES OR PERSON RESPONDS BUT RELAPSES, REPEAT USING A NEW SPRAY DEVICE AND SPRAY INTO THE OTHER NOSTRIL AS NEEDED FOR RESCUE 01/21/23   [provider]  omeprazole  (PRILOSEC) 40 MG capsule Take 1 capsule (40 mg total) by mouth daily. Patient taking differently: Take 40 mg by mouth daily as needed. 06/06/22   Kennedy-Smith, Colleen M, NP  ondansetron  (ZOFRAN -ODT) 8 MG disintegrating tablet Take 8 mg by mouth every 6 (six) hours. 11/06/23   [provider]  oxyCODONE -acetaminophen  (PERCOCET/ROXICET) 5-325 MG tablet Take 1 tablet by mouth every 4 (four) hours as needed for severe pain (pain score 7-10). 10/20/23   Magda Debby SAILOR, MD  PSYLLIUM PO Take by mouth. 09/03/23   [provider]  rosuvastatin  (CRESTOR ) 10 MG tablet Take 10 mg by mouth daily.    [provider]  sevelamer  carbonate (RENVELA ) 800 MG tablet Take 1,600 mg by mouth 3 (three) times daily with meals. 01/06/23   [provider]  sorbitol 70 % solution Take 30 mLs by mouth 3 (three) times daily as needed (constipation). 01/20/23   [provider]  tamsulosin  (FLOMAX ) 0.4 MG CAPS capsule Take 0.4 mg by mouth at bedtime.    [provider]  Tiotropium Bromide-Olodaterol 2.5-2.5 MCG/ACT AERS Take 2 puffs by mouth  in the morning. 08/07/23   [provider]    Allergies: Enalapril , Gabapentin , and Iodinated contrast media    Review of Systems  Updated Vital Signs BP 121/82 (BP Location: Right Arm)   Pulse 72   Temp 98.4 F (36.9 C) (Axillary)   Resp 14   Ht 1.803 m (5' 11)   Wt 72.1 kg   SpO2 100%   BMI 22.18 kg/m   Physical Exam CONSTITUTIONAL: Disheveled, elderly HEAD: Normocephalic/atraumatic ABDOMEN: soft, nontender, no rebound or guarding, bowel sounds noted throughout abdomen GU: Patient wearing a diaper.  He is uncircumcised.  Blood noted at the urethral meatus NEURO: Pt is awake/alert/appropriate, moves all extremitiesx4.  No facial droop.    (all labs ordered are listed, but only abnormal results are displayed) Labs Reviewed  URINE CULTURE    EKG: EKG Interpretation Date/Time:  Sunday March 27 2024 05:33:27 EDT Ventricular Rate:  73 PR Interval:  195 QRS Duration:  102 QT Interval:  428 QTC Calculation: 472 R Axis:   29  Text Interpretation: Sinus rhythm Probable left atrial enlargement Probable anterolateral infarct, age indeterm Baseline wander in lead(s) V6 Confirmed by Midge Golas ([id number]) on 03/27/2024 5:45:19 AM  Radiology: No results found.   Procedures   Medications Ordered in the ED - No data to display  Clinical Course as of 03/27/24 0704  [facility] Mar 27, 2024  0558 Patient with multiple medical conditions including ESRD, substance use disorder presents for bleeding from his penis.  He is on anticoagulation.  He was just released from [facility].  While inpatient he had a Foley catheter that he removed himself.  Urology was consulted and did not not feel that that it needed to be replaced [DW]  0559 Patient just had lab work done less than 24 hours ago Will defer labs for now.  Will check bladder scan and reassess [DW]  0703 Patient improved after Foley catheter was placed.  He is sitting up eating skittles in no distress Foley has  been flushed without difficulty. Patient likely had a blood clot causing acute urinary retention.  Advised that he needs to keep the catheter in until seen by urology.  Discussed this with patient and his family Will defer further workup at this time [DW]    Clinical Course User Index [DW] Midge Golas, MD                                 Medical Decision Making Amount and/or Complexity of Data Reviewed Labs: ordered.        Final diagnoses:  Gross hematuria  Urinary retention    ED Discharge Orders     None          Midge Golas, MD 03/27/24 [phone]

## 2024-03-27 NOTE — ED Notes (Signed)
 Foley flushed. Tolerated well.

## 2024-03-28 NOTE — Progress Notes (Signed)
 Case Management Discharge Note        CSN: [id number] DOB: Mar 08, 1956 Service: General Medicine Location: N921/A  Patient Class: Inpatient  DC Disposition: : Home or Self Care  Discharge DC Disposition: : Home or Self Care  Discharge Referrals Patient Preference: Chosen geographical local area/county shared with patient/family: Return/previous involvement Patient Preference for Post-Acute Provider Form completed: Return/Previous Involvement Case closed, patient/family agree with disposition plan: Yes       Case Management Coordination Status: Coordination Complete     Morna Croft, MSW Social Worker 9NB

## 2024-03-28 NOTE — Care Plan (Signed)
 Nephrology Plan of Care Note: Dialysis Book Back Orders  These dialysis bookback orders have been communicated directly with Camellia Chapel, RN at [facility] (Phone:[phone], Fax: [phone])  dialysis center on 03/28/24 at 8:28 AM.   Patient: Ronald Reeves Date of Birth: [birth year] MRN: [id number] Admission Date: 03/22/2024  2:30 PM Discharge Date: 03/26/2024  4:46 PM  Discharge Diagnosis:  Active Problems:   Cirrhosis of liver with ascites    (CMD)   Polysubstance abuse (CMD)   Chronic pain of right knee   Tobacco use   Longstanding persistent atrial fibrillation    (CMD) Resolved Problems:   Hyperkalemia   ESRD (end stage renal disease)    (CMD)   Suicidal ideation   Elevated troponin   Gross hematuria   Most Recent Renal Labs  Results from last 7 days  Lab Units 03/26/24 0649 03/25/24 1122 03/25/24 0729  HEMOGLOBIN g/dL 85.9  --  84.7  INR   --  1.3  --   POTASSIUM mmol/L 4.3  --  4.6  CO2 mmol/L 27  --  24  CALCIUM  mg/dL 8.6  --  9.0  PHOSPHORUS mg/dL 5.3*  --  6.7*    Outpatient Dialysis BookBack Orders: - Please change estimated dry weight to: 59.5 kg  Medication and Lab Orders: - See updated medication list below  Updated discharge medication list:   Medication List     ASK your doctor about these medications    albuterol  HFA 90 mcg/actuation inhaler Commonly known as: PROVENTIL  HFA;VENTOLIN  HFA;PROAIR  HFA Inhale 2 puffs every 4 (four) hours as needed for wheezing.   amLODIPine  10 mg tablet Commonly known as: NORVASC  Take 10 mg by mouth daily. HOLD DOSE FOR SYSTOLIC BLOOD PRESSURE LESS THAN 100; AVOID GRAPEFRUIT OR ITS JUICE WITH THIS MED   apixaban  5 mg Tab Commonly known as: ELIQUIS  Take 1 tablet by mouth 2 (two) times a day.   ARIPiprazole  5 mg tablet Commonly known as: ABILIFY  Take 5 mg by mouth daily.   carvediloL  12.5 mg tablet Commonly known as: COREG  Take 1 tablet by mouth 2 (two) times a day.   cholecalciferol 1,000 unit (25  mcg) tablet Commonly known as: VITAMIN D3   doxycycline  100 mg tablet Commonly known as: VIBRA -TABS Take 100 mg by mouth 2 (two) times a day.   escitalopram  10 mg tablet Commonly known as: LEXAPRO  Take 10 mg by mouth daily. Ask about: Which instructions should I use?   famotidine 40 mg tablet Commonly known as: PEPCID Take 40 mg by mouth 3 (three) times a day as needed.   ferric citrate  210 mg iron Tab tablet Commonly known as: AURYXIA Take 420 mg by mouth.   ferrous sulfate 325 mg (65 mg iron) tablet Take 1 tablet by mouth daily with breakfast.   furosemide  40 mg tablet Commonly known as: LASIX  Take 40 mg by mouth daily. (Take on non-dialysis days only) Ask about: Which instructions should I use?   isosorbide  mononitrate 30 mg 24 hr tablet Commonly known as: IMDUR    mirtazapine  15 mg tablet Commonly known as: REMERON  Take 7.5 mg by mouth.   naloxone  4 mg/actuation Spry nasal spray Commonly known as: NARCAN  Administer into affected nostril(s).   omeprazole  40 mg DR capsule Commonly known as: PriLOSEC Take by mouth.   predniSONE  20 mg tablet Commonly known as: DELTASONE  Take 40 mg by mouth daily.   rosuvastatin  10 mg tablet Commonly known as: CRESTOR  Take 1 tablet by mouth nightly.   sevelamer  carbonate  800 mg tablet Commonly known as: RENVELA  Take 2 tablets by mouth.   sorbitoL 70 % solution Take by mouth.   tamsulosin  0.4 mg Cap Commonly known as: FLOMAX  Take by mouth.   tiotropium-olodateroL 2.5-2.5 mcg/actuation inhaler Commonly known as: STIOLTO RESPIMAT Inhale 2 puffs daily.   Trelegy Ellipta 200-62.5-25 mcg inhaler Generic drug: fluticasone-umeclidinium-vilanterol Inhale 1 puff in the morning.        During admission, the patient did not receive a RBC transfusion.  Allergy list at DC: Allergies[1]  Electronically signed by:  Rosina Stephane Amel, DNP, 03/28/2024 8:28 AM       [1] Allergies Allergen Reactions  . Enalapril  Other  (See Comments)    Renal failure syndrome  . Gabapentin  Other (See Comments)    Encephalopathy, tremor  . Iodinated Contrast Media Nausea And Vomiting

## 2024-03-30 ENCOUNTER — Telehealth (HOSPITAL_BASED_OUTPATIENT_CLINIC_OR_DEPARTMENT_OTHER): Payer: Self-pay

## 2024-03-30 LAB — URINE CULTURE: Culture: >=100000 — AB

## 2024-03-30 NOTE — Progress Notes (Signed)
 ED Antimicrobial Stewardship Positive Culture Follow Up   Ronald Reeves is an 68 y.o. male who presented to [facility] on 03/27/2024 with a chief complaint of  Chief Complaint  Patient presents with   bleeding from penis    Recent Results (from the past 720 hours)  Urine Culture     Status: Abnormal   Collection Time: 03/27/24  6:14 AM   Specimen: Urine, Catheterized  Result Value Ref Range Status   Specimen Description URINE, CATHETERIZED  Final   Special Requests   Final    NONE Performed at [facility], [address], [place], [place] [address]    Culture >=100,000 COLONIES/mL SERRATIA MARCESCENS (A)  Final   Report Status 03/30/2024 FINAL  Final   Organism ID, Bacteria SERRATIA MARCESCENS (A)  Final      Susceptibility   Serratia marcescens - MIC*    CEFEPIME  >=32 RESISTANT Resistant     CEFTRIAXONE  >=64 RESISTANT Resistant     CIPROFLOXACIN 0.12 SENSITIVE Sensitive     GENTAMICIN  2 SENSITIVE Sensitive     NITROFURANTOIN 256 RESISTANT Resistant     TRIMETH/SULFA <=20 SENSITIVE Sensitive     MEROPENEM <=0.25 SENSITIVE Sensitive     * >=100,000 COLONIES/mL SERRATIA MARCESCENS    [x]  Patient discharged originally without antimicrobial agent and urine cultures have resulted with Serratia marcescens   ED Provider: Mliss Boyers, MD   Plan:  - If no urinary symptoms reported, no antibiotics warranted  - If patient reports urinary symptoms, start ciprofloxacin 250 mg q24h for 5 days    Feliciano Close, PharmD PGY2 Infectious Diseases Pharmacy Resident  03/30/2024 9:09 AM

## 2024-03-30 NOTE — Telephone Encounter (Signed)
 Post ED Visit - Positive Culture Follow-up: Unsuccessful Patient Follow-up  Culture assessed and recommendations reviewed by:  [x]  Feliciano Close, Pharm.D. []  Venetia Gully, [address].D., BCPS AQ-ID []  Garrel Crews, Pharm.D., BCPS []  Almarie Lunger, Pharm.D., BCPS []  [place], [address].D., BCPS, AAHIVP []  Rosaline Bihari, Pharm.D., BCPS, AAHIVP []  Massie Rigg, PharmD []  Jodie Rower, PharmD, BCPS  Positive urine culture  [x]  Patient discharged without antimicrobial prescription and treatment is now indicated []  Organism is resistant to prescribed ED discharge antimicrobial []  Patient with positive blood cultures   Plan - call pt for s/s check if positive start Ciprofloxacin 250 mg po daily for 5 days. per ED provider Mliss Boyers, MD    Unable to contact patient after 3 attempts, letter will be sent to address on file  Ronald Reeves 03/30/2024, 11:24 AM

## 2024-04-01 ENCOUNTER — Encounter (HOSPITAL_COMMUNITY): Payer: Self-pay

## 2024-04-01 ENCOUNTER — Emergency Department (HOSPITAL_COMMUNITY)
Admission: EM | Admit: 2024-04-01 | Discharge: 2024-04-01 | Disposition: A | Discharge status: Home / Self Care | Attending: Emergency Medicine | Admitting: Emergency Medicine

## 2024-04-01 ENCOUNTER — Other Ambulatory Visit: Payer: Self-pay

## 2024-04-01 ENCOUNTER — Emergency Department (HOSPITAL_COMMUNITY)

## 2024-04-01 DIAGNOSIS — Z7901 Long term (current) use of anticoagulants: Secondary | ICD-10-CM | POA: Diagnosis not present

## 2024-04-01 DIAGNOSIS — Z992 Dependence on renal dialysis: Secondary | ICD-10-CM | POA: Insufficient documentation

## 2024-04-01 DIAGNOSIS — E875 Hyperkalemia: Secondary | ICD-10-CM | POA: Insufficient documentation

## 2024-04-01 DIAGNOSIS — N186 End stage renal disease: Secondary | ICD-10-CM | POA: Insufficient documentation

## 2024-04-01 DIAGNOSIS — J449 Chronic obstructive pulmonary disease, unspecified: Secondary | ICD-10-CM | POA: Diagnosis not present

## 2024-04-01 DIAGNOSIS — R55 Syncope and collapse: Secondary | ICD-10-CM | POA: Insufficient documentation

## 2024-04-01 DIAGNOSIS — I509 Heart failure, unspecified: Secondary | ICD-10-CM | POA: Diagnosis not present

## 2024-04-01 DIAGNOSIS — A09 Infectious gastroenteritis and colitis, unspecified: Secondary | ICD-10-CM | POA: Diagnosis not present

## 2024-04-01 DIAGNOSIS — R197 Diarrhea, unspecified: Secondary | ICD-10-CM

## 2024-04-01 LAB — COMPREHENSIVE METABOLIC PANEL WITH GFR
ALT: 23 U/L (ref 0–44)
AST: 29 U/L (ref 15–41)
Albumin: 3 g/dL — ABNORMAL LOW (ref 3.5–5.0)
Alkaline Phosphatase: 89 U/L (ref 38–126)
Anion gap: 18 — ABNORMAL HIGH (ref 5–15)
BUN: 73 mg/dL — ABNORMAL HIGH (ref 8–23)
CO2: 25 mmol/L (ref 22–32)
Calcium: 8.6 mg/dL — ABNORMAL LOW (ref 8.9–10.3)
Chloride: 96 mmol/L — ABNORMAL LOW (ref 98–111)
Creatinine, Ser: 6.33 mg/dL — ABNORMAL HIGH (ref 0.61–1.24)
GFR, Estimated: 9 mL/min — ABNORMAL LOW (ref >60)
Glucose, Bld: 98 mg/dL (ref 70–99)
Potassium: 5.4 mmol/L — ABNORMAL HIGH (ref 3.5–5.1)
Sodium: 139 mmol/L (ref 135–145)
Total Bilirubin: 0.9 mg/dL (ref 0.0–1.2)
Total Protein: 6.6 g/dL (ref 6.5–8.1)

## 2024-04-01 LAB — CBC
HCT: 46 % (ref 39.0–52.0)
Hemoglobin: 15.2 g/dL (ref 13.0–17.0)
MCH: 26.9 pg (ref 26.0–34.0)
MCHC: 33 g/dL (ref 30.0–36.0)
MCV: 81.3 fL (ref 80.0–100.0)
Platelets: 99 K/uL — ABNORMAL LOW (ref 150–400)
RBC: 5.66 MIL/uL (ref 4.22–5.81)
RDW: 19.4 % — ABNORMAL HIGH (ref 11.5–15.5)
WBC: 4.1 K/uL (ref 4.0–10.5)
nRBC: 0 % (ref 0.0–0.2)

## 2024-04-01 LAB — BRAIN NATRIURETIC PEPTIDE: B Natriuretic Peptide: >4500 pg/mL — ABNORMAL HIGH (ref 0.0–100.0)

## 2024-04-01 LAB — ETHANOL: Alcohol, Ethyl (B): <15 mg/dL (ref <15)

## 2024-04-01 LAB — TROPONIN I (HIGH SENSITIVITY): Troponin I (High Sensitivity): 56 ng/L — ABNORMAL HIGH (ref <18)

## 2024-04-01 LAB — LIPASE, BLOOD: Lipase: 36 U/L (ref 11–51)

## 2024-04-01 LAB — MAGNESIUM: Magnesium: 2.4 mg/dL (ref 1.7–2.4)

## 2024-04-01 MED ORDER — ONDANSETRON 4 MG PO TBDP
4.0000 mg | ORAL_TABLET | Freq: Once | ORAL | Status: AC
  Administered 2024-04-01: 4 mg via ORAL
  Filled 2024-04-01: qty 1

## 2024-04-01 MED ORDER — SODIUM ZIRCONIUM CYCLOSILICATE 10 G PO PACK
10.0000 g | PACK | Freq: Once | ORAL | Status: AC
  Administered 2024-04-01: 10 g via ORAL
  Filled 2024-04-01: qty 1

## 2024-04-01 NOTE — ED Triage Notes (Signed)
 Pt. Arrived via [facility] from home; States that pt. Passed out earlier today after visiting his primary care provider, which they advised him to go to the ED immediately but pt. Went home instead; Pt. Is on dialysis on Tuesdays, Thursdays, and Saturdays; Pt. States that he is incontinent of bowels and produces very little urine; Per [facility], pt. Does cocaine  every day' Pt. Has a port in and he has a Hx of COPD< Depression, CHF, and Renal Failure.

## 2024-04-01 NOTE — ED Provider Notes (Signed)
 [facility] AT [facility] Provider Note   CSN: [id number] Arrival date & time: 04/01/24  1206     Patient presents with: Loss of Consciousness   Ronald Reeves is a 68 y.o. male with past medical history significant for CKD, ESRD on dialysis who goes on Tuesday Thursday Saturday, alcohol abuse, cocaine  use daily, cannabis use, COPD, CHF, GERD presents with concern for weakness, syncope.  Saw a general practitioner today and advised to go to the hospital, went home instead.  EMS reports that they have been called out multiple times this last 1 to 2 weeks for patient but he normally declines transport.  He denies any chest pain, reports shortness of breath.  He did not complete his last dialysis treatment yesterday, reports that he got 2 hours.  Denies any fever, chills.  Denies any dysuria.  Reports some mild abdominal pain, nausea.  Makes a very small amount of urine.   HPI     Prior to Admission medications   Medication Sig Start Date End Date Taking? Authorizing Provider  albuterol  (VENTOLIN  HFA) 108 (90 Base) MCG/ACT inhaler Inhale 2 puffs into the lungs every 6 (six) hours as needed (COPD).    [provider]  amLODipine  (NORVASC ) 10 MG tablet Take 1 tablet (10 mg total) by mouth daily. Patient taking differently: Take 10 mg by mouth daily. Hold for systolic BP <100 4/75/80   Pietro Redell RAMAN, MD  apixaban  (ELIQUIS ) 5 MG TABS tablet Take 1 tablet (5 mg total) by mouth 2 (two) times daily. 03/06/23   Atway, Rayann N, DO  ARIPiprazole  (ABILIFY ) 5 MG tablet Take 1 tablet (5 mg total) by mouth daily. 11/20/23   Ghimire, Donalda HERO, MD  B Complex-C-Folic Acid  (DIALYVITE  TABLET) TABS Take 1 tablet by mouth daily. 12/23/22   [provider]  carvedilol  (COREG ) 12.5 MG tablet Take 12.5 mg by mouth 2 (two) times daily with a meal.    [provider]  escitalopram  (LEXAPRO ) 20 MG tablet Take 0.5 tablets (10 mg total) by mouth at bedtime. 11/19/23    Ghimire, Donalda HERO, MD  ferric citrate  (AURYXIA) 1 GM 210 MG(Fe) tablet Take 210 mg by mouth 3 (three) times daily with meals.    [provider]  furosemide  (LASIX ) 40 MG tablet Take 40 mg by mouth daily.    [provider]  hydrALAZINE  (APRESOLINE ) 25 MG tablet Take 25 mg by mouth 2 (two) times daily.    [provider]  isosorbide  mononitrate (IMDUR ) 30 MG 24 hr tablet Take 30 mg by mouth daily.    [provider]  lidocaine  (LIDODERM ) 5 % Place 2 patches onto the skin daily. Remove & Discard patch within 12 hours or as directed by MD    [provider]  naloxone  (NARCAN ) nasal spray 4 mg/0.1 mL Place 0.4 mg into the nose See admin instructions.  SPRAY 1 SPRAY INTO ONE NOSTRIL AS DIRECTED AS NEEDED FOR RESCUE FOR OPIOID OVERDOSE - CALL 911 IMMEDIATELY, ADMINISTER DOSE, THEN TURN PERSON ON SIDE - IF NO RESPONSE IN 2-3 MINUTES OR PERSON RESPONDS BUT RELAPSES, REPEAT USING A NEW SPRAY DEVICE AND SPRAY INTO THE OTHER NOSTRIL AS NEEDED FOR RESCUE 01/21/23   [provider]  omeprazole  (PRILOSEC) 40 MG capsule Take 1 capsule (40 mg total) by mouth daily. Patient taking differently: Take 40 mg by mouth daily as needed. 06/06/22   Kennedy-Smith, Colleen M, NP  ondansetron  (ZOFRAN -ODT) 8 MG disintegrating tablet Take 8 mg  by mouth every 6 (six) hours. 11/06/23   [provider]  oxyCODONE -acetaminophen  (PERCOCET/ROXICET) 5-325 MG tablet Take 1 tablet by mouth every 4 (four) hours as needed for severe pain (pain score 7-10). 10/20/23   Magda Debby SAILOR, MD  PSYLLIUM PO Take by mouth. 09/03/23   [provider]  rosuvastatin  (CRESTOR ) 10 MG tablet Take 10 mg by mouth daily.    [provider]  sevelamer  carbonate (RENVELA ) 800 MG tablet Take 1,600 mg by mouth 3 (three) times daily with meals. 01/06/23   [provider]  sorbitol 70 % solution Take 30 mLs by mouth 3 (three) times daily as needed (constipation). 01/20/23    [provider]  tamsulosin  (FLOMAX ) 0.4 MG CAPS capsule Take 0.4 mg by mouth at bedtime.    [provider]  Tiotropium Bromide-Olodaterol 2.5-2.5 MCG/ACT AERS Take 2 puffs by mouth in the morning. 08/07/23   [provider]    Allergies: Enalapril , Gabapentin , and Iodinated contrast media    Review of Systems  All other systems reviewed and are negative.   Updated Vital Signs BP (!) 147/79   Pulse 76   Resp 16   Ht 5' 11 (1.803 m)   Wt 72.1 kg   SpO2 99%   BMI 22.17 kg/m   Physical Exam Vitals and nursing note reviewed.  Constitutional:      General: He is not in acute distress.    Appearance: Normal appearance.  HENT:     Head: Normocephalic and atraumatic.  Eyes:     General:        Right eye: No discharge.        Left eye: No discharge.  Cardiovascular:     Rate and Rhythm: Normal rate and regular rhythm.     Heart sounds: No murmur heard.    No friction rub. No gallop.  Pulmonary:     Effort: Pulmonary effort is normal.     Breath sounds: Normal breath sounds.  Abdominal:     General: Bowel sounds are normal.     Palpations: Abdomen is soft.     Comments: Diffuse TTP in abdomen, no distention, no rebound, rigidity, guarding  Skin:    General: Skin is warm and dry.     Capillary Refill: Capillary refill takes less than 2 seconds.  Neurological:     Mental Status: He is alert and oriented to person, place, and time.  Psychiatric:        Mood and Affect: Mood normal.        Behavior: Behavior normal.     (all labs ordered are listed, but only abnormal results are displayed) Labs Reviewed  CBC - Abnormal; Notable for the following components:      Result Value   RDW 19.4 (*)    Platelets 99 (*)    All other components within normal limits  COMPREHENSIVE METABOLIC PANEL WITH GFR - Abnormal; Notable for the following components:   Potassium 5.4 (*)    Chloride 96 (*)    BUN 73 (*)    Creatinine, Ser 6.33 (*)    Calcium  8.6  (*)    Albumin  3.0 (*)    GFR, Estimated 9 (*)    Anion gap 18 (*)    All other components within normal limits  BRAIN NATRIURETIC PEPTIDE - Abnormal; Notable for the following components:   B Natriuretic Peptide >4,500.0 (*)    All other components within normal limits  TROPONIN I (HIGH SENSITIVITY) - Abnormal;  Notable for the following components:   Troponin I (High Sensitivity) 56 (*)    All other components within normal limits  LIPASE, BLOOD  ETHANOL  MAGNESIUM   RAPID URINE DRUG SCREEN, HOSP PERFORMED  URINALYSIS, ROUTINE W REFLEX MICROSCOPIC  TROPONIN I (HIGH SENSITIVITY)    EKG: EKG Interpretation Date/Time:  Friday April 01 2024 12:20:19 EDT Ventricular Rate:  60 PR Interval:  205 QRS Duration:  114 QT Interval:  473 QTC Calculation: 473 R Axis:   -49  Text Interpretation: Sinus rhythm Probable left atrial enlargement Left anterior fascicular block LVH with secondary repolarization abnormality Anterior infarct, old No significant change since prior 8/25 Confirmed by Towana Sharper [phone]) on 04/01/2024 12:24:32 PM  Radiology: DG Chest Portable 1 View Result Date: 04/01/2024 CLINICAL DATA:  Short of breath EXAM: PORTABLE CHEST 1 VIEW COMPARISON:  None Available. FINDINGS: Large-bore central venous line with tip in the RIGHT atrium. Stable large cardiac silhouette. No effusion, infiltrate, or pneumothorax. IMPRESSION: 1. No acute cardiopulmonary process. 2. Large-bore central venous line with tip in the RIGHT atrium. Electronically Signed   By: Jackquline Boxer M.D.   On: 04/01/2024 14:13   CT Head Wo Contrast Result Date: 04/01/2024 CLINICAL DATA:  Syncope/presyncope. Cerebrovascular cause suspected. EXAM: CT HEAD WITHOUT CONTRAST TECHNIQUE: Contiguous axial images were obtained from the base of the skull through the vertex without intravenous contrast. RADIATION DOSE REDUCTION: This exam was performed according to the departmental dose-optimization program which includes  automated exposure control, adjustment of the mA and/or kV according to patient size and/or use of iterative reconstruction technique. COMPARISON:  01/24/2024 FINDINGS: Brain: No focal abnormality affecting the brainstem or cerebellum. Chronic small-vessel ischemic changes of the cerebral hemispheric white matter. No sign of acute infarction, mass lesion, hemorrhage, hydrocephalus or extra-axial collection. Vascular: There is atherosclerotic calcification of the major vessels at the base of the brain. Skull: No skull fracture. Sinuses/Orbits: Clear except for a retention cyst in the right division of the sphenoid sinus. Orbits negative. Other: Right frontal scalp swelling. IMPRESSION: 1. No acute intracranial finding. Chronic small-vessel ischemic changes of the cerebral hemispheric white matter. 2. Right frontal scalp swelling. No skull fracture. Electronically Signed   By: Oneil Officer M.D.   On: 04/01/2024 13:27     Procedures   Medications Ordered in the ED  ondansetron  (ZOFRAN -ODT) disintegrating tablet 4 mg (4 mg Oral Given 04/01/24 1322)  sodium zirconium cyclosilicate  (LOKELMA ) packet 10 g (10 g Oral Given 04/01/24 1402)    Clinical Course as of 04/01/24 1419  Fri Apr 01, 2024  [id number] 68 year old male here for evaluation of syncope.  History of end-stage renal disease and polysubstance use.  Chronically unwell appearing.  Getting labs EKG imaging.  Disposition per results of testing. [MB]    Clinical Course User Index [MB] Towana Sharper BROCKS, MD                                 Medical Decision Making  This patient is a 68 y.o. male  who presents to the ED for concern of syncope, weakness.   Differential diagnoses prior to evaluation: The emergent differential diagnosis includes, but is not limited to,  CVA, ACS, arrhythmia, vasovagal / orthostatic hypotension, sepsis, hypoglycemia, electrolyte disturbance, respiratory failure, anemia, dehydration, heat injury, polypharmacy, malignancy,  anxiety/panic attack . This is not an exhaustive differential.   Past Medical History / Co-morbidities / Social History: CKD, ESRD on dialysis who  goes on Tuesday Thursday Saturday, alcohol abuse, cocaine  use daily, cannabis use, COPD, CHF, GERD  Additional history: Chart reviewed. Pertinent results include: Reviewed lab work, imaging from recent hospitalizations, notably was admitted on 8/12, discharged on the 16th AGAINST MEDICAL ADVICE after hyperkalemia, intermittent chest pain, fluid overload, need for emergent dialysis.  Physical Exam: Physical exam performed. The pertinent findings include: Some coarse rhonchi throughout lung fields, no respiratory distress, no significant wheezing.  Some crackles at lung bases.  Diffusely tender in abdomen, no rebound, rigidity, guarding.  Lab Tests/Imaging studies: I personally interpreted labs/imaging and the pertinent results include: CBC overall unremarkable other than some thrombocytopenia, platelets 99.  CMP notable for hyperkalemia, potassium 5.4, BUN 73, creatinine 6.33 in context of known ESRD he does have an anion gap of 18, suspect secondary to uremia.  Normal magnesium , normal lipase.  Negative ethanol level.  His troponin is mildly elevated at 56, does seem to be chronically elevated in this patient, likely related to end-stage renal disease plus chronic cocaine  use.  BNP is greater than 4500 but without signs of severe fluid overload on exam.  Right frontal scalp swelling noted on CT head, no significant intracranial abnormality, plain film chest x-ray with no evidence of focal consolidation, some diffuse interstitial edema noted.  I agree with the radiologist interpretation.  Cardiac monitoring: EKG obtained and interpreted by myself and attending physician which shows: Normal sinus rhythm, LVH, no significant change from baseline   Medications: I ordered medication including Zofran  for nausea, Lokelma  for hyperkalemia.  I have reviewed the  patients home medicines and have made adjustments as needed.  Ultimately given his syncope versus near syncope, significantly elevated BNP, elevated troponin, although the troponin is not significantly changed from his baseline, and some moderate hyperkalemia I discussed with patient, wife, daughter that he would meet admission criteria for high risk syncope, and could benefit from hospital admission, however patient declines admission or further workup at this time, this is consistent with his pattern, he left AGAINST MEDICAL ADVICE on the 16th of this month from prior admission.  Discussed with patient, family that I cannot force him to stay in the hospital, ultimately his labs although chronically quite poor are not significantly different from his baseline other than his mildly elevated potassium today   Disposition: After consideration of the diagnostic results and the patients response to treatment, I feel that patient might benefit from hospital admission as discussed above for hyperkalemia, dialysis, syncope workup, but he declines admission at this time, ultimately I think that he within capacity to refuse admission, and does not have overly significant change from baseline other than some hyperkalemia.  Encouraged him to continue dialysis as scheduled.   emergency department workup does suggest an emergent condition warranting admission, patient declined admission or immediate intervention beyond what has been performed at this time. The plan is: as above, discharge with close follow-up precautions. The patient is safe for discharge and has been instructed to return immediately for worsening symptoms, change in symptoms or any other concerns.  I discussed this case with my attending physician who cosigned this note including patient's presenting symptoms, physical exam, and planned diagnostics and interventions. Attending physician stated agreement with plan or made changes to plan which were  implemented.    Final diagnoses:  Hyperkalemia  Diarrhea of presumed infectious origin  Near syncope    ED Discharge Orders     None          Jenavi Beedle H, PA-C  04/01/24 1419    Towana Ozell BROCKS, MD 04/01/24 [phone]

## 2024-04-01 NOTE — ED Notes (Signed)
 Help get patient cleaned up place a brief and got patient some warm blankets patient is resting with call bell in reach

## 2024-04-01 NOTE — Discharge Instructions (Addendum)
 Please continue home medications, and follow up with dialysis in the morning, please return if you have worsening weakness, diarrhea, nausea, vomiting or further episodes of passing out. I encourage you to discontinue alcohol, drug use, and tobacco use.

## 2024-04-06 ENCOUNTER — Emergency Department (HOSPITAL_COMMUNITY)

## 2024-04-06 ENCOUNTER — Other Ambulatory Visit: Payer: Self-pay

## 2024-04-06 ENCOUNTER — Emergency Department (HOSPITAL_COMMUNITY)
Admission: EM | Admit: 2024-04-06 | Discharge: 2024-04-07 | Disposition: A | Discharge status: Home / Self Care | Attending: Emergency Medicine | Admitting: Emergency Medicine

## 2024-04-06 ENCOUNTER — Encounter (HOSPITAL_COMMUNITY): Payer: Self-pay

## 2024-04-06 DIAGNOSIS — W19XXXA Unspecified fall, initial encounter: Secondary | ICD-10-CM | POA: Insufficient documentation

## 2024-04-06 DIAGNOSIS — S46911A Strain of unspecified muscle, fascia and tendon at shoulder and upper arm level, right arm, initial encounter: Secondary | ICD-10-CM | POA: Diagnosis not present

## 2024-04-06 DIAGNOSIS — M546 Pain in thoracic spine: Secondary | ICD-10-CM | POA: Diagnosis not present

## 2024-04-06 DIAGNOSIS — E875 Hyperkalemia: Secondary | ICD-10-CM | POA: Diagnosis not present

## 2024-04-06 DIAGNOSIS — G928 Other toxic encephalopathy: Secondary | ICD-10-CM | POA: Diagnosis not present

## 2024-04-06 DIAGNOSIS — M542 Cervicalgia: Secondary | ICD-10-CM | POA: Diagnosis not present

## 2024-04-06 DIAGNOSIS — S40911A Unspecified superficial injury of right shoulder, initial encounter: Secondary | ICD-10-CM | POA: Diagnosis present

## 2024-04-06 DIAGNOSIS — Z7901 Long term (current) use of anticoagulants: Secondary | ICD-10-CM | POA: Insufficient documentation

## 2024-04-06 DIAGNOSIS — R519 Headache, unspecified: Secondary | ICD-10-CM | POA: Diagnosis not present

## 2024-04-06 LAB — CBC
HCT: 40.1 % (ref 39.0–52.0)
Hemoglobin: 13.3 g/dL (ref 13.0–17.0)
MCH: 26.7 pg (ref 26.0–34.0)
MCHC: 33.2 g/dL (ref 30.0–36.0)
MCV: 80.4 fL (ref 80.0–100.0)
Platelets: 107 K/uL — ABNORMAL LOW (ref 150–400)
RBC: 4.99 MIL/uL (ref 4.22–5.81)
RDW: 17.6 % — ABNORMAL HIGH (ref 11.5–15.5)
WBC: 5.3 K/uL (ref 4.0–10.5)
nRBC: 0 % (ref 0.0–0.2)

## 2024-04-06 LAB — BASIC METABOLIC PANEL WITH GFR
Anion gap: 16 — ABNORMAL HIGH (ref 5–15)
BUN: 77 mg/dL — ABNORMAL HIGH (ref 8–23)
CO2: 22 mmol/L (ref 22–32)
Calcium: 8.9 mg/dL (ref 8.9–10.3)
Chloride: 95 mmol/L — ABNORMAL LOW (ref 98–111)
Creatinine, Ser: 6.7 mg/dL — ABNORMAL HIGH (ref 0.61–1.24)
GFR, Estimated: 8 mL/min — ABNORMAL LOW (ref >60)
Glucose, Bld: 108 mg/dL — ABNORMAL HIGH (ref 70–99)
Potassium: 5.9 mmol/L — ABNORMAL HIGH (ref 3.5–5.1)
Sodium: 133 mmol/L — ABNORMAL LOW (ref 135–145)

## 2024-04-06 MED ORDER — SODIUM ZIRCONIUM CYCLOSILICATE 10 G PO PACK
10.0000 g | PACK | Freq: Once | ORAL | Status: AC
  Administered 2024-04-07: 10 g via ORAL
  Filled 2024-04-06: qty 1

## 2024-04-06 MED ORDER — FENTANYL CITRATE PF 50 MCG/ML IJ SOSY
50.0000 ug | PREFILLED_SYRINGE | Freq: Once | INTRAMUSCULAR | Status: DC
  Filled 2024-04-06: qty 1

## 2024-04-06 MED ORDER — ONDANSETRON HCL 4 MG/2ML IJ SOLN
4.0000 mg | Freq: Once | INTRAMUSCULAR | Status: AC
  Administered 2024-04-06: 4 mg via INTRAVENOUS
  Filled 2024-04-06: qty 2

## 2024-04-06 MED ORDER — OXYCODONE-ACETAMINOPHEN 5-325 MG PO TABS: 1.0000 | ORAL_TABLET | ORAL | 0 refills | Status: DC | PRN

## 2024-04-06 NOTE — ED Triage Notes (Signed)
 Pt coming in from home where he had a witnessed fall. Pt was walking in the living room and fell. Pt takes elequis and plavix. Pt is also a dialysis pt. Pt has dialysis Tues , thurs , Saturday.  When pt fell the chair , walker and tv fell on top of him.  Pt complaining of pain in head neck right shoulder and right knee. Pt denies loc. Pt is addicted to crack and is going threw withdrawal per ems.  Pt given 100mcg of fentanyl  intranasally.  Pt wears 2 to 4 l baseline oxygen spo2 98%  Hr 80  Cbg 312 Bp 128/90

## 2024-04-06 NOTE — ED Notes (Signed)
 Pt family requested for nasal cannula to be placed back on as it had fallen off; resumed at 3L. Pulse ox reconnected. O2 saturation at 100%.

## 2024-04-06 NOTE — ED Provider Notes (Signed)
 [place] EMERGENCY DEPARTMENT AT [facility] Provider Note   CSN: [id number] Arrival date & time: 04/06/24  2006     Patient presents with: Ronald Reeves is a 68 y.o. male.    Fall  Patient presented from home for reportedly wheelchair and [place].  Was reportedly also had a TV fall on him.  Complaining of pain in his head neck and upper back.  Denies loss of continence.  He is on blood thinners.  Patient refuses to wear his cervical collar.  Denies other injury.    Prior to Admission medications   Medication Sig Start Date End Date Taking? Authorizing Provider  albuterol  (VENTOLIN  HFA) 108 (90 Base) MCG/ACT inhaler Inhale 2 puffs into the lungs every 6 (six) hours as needed (COPD).    [provider]  amLODipine  (NORVASC ) 10 MG tablet Take 1 tablet (10 mg total) by mouth daily. Patient taking differently: Take 10 mg by mouth daily. Hold for systolic BP <100 4/75/80   Pietro Redell RAMAN, MD  apixaban  (ELIQUIS ) 5 MG TABS tablet Take 1 tablet (5 mg total) by mouth 2 (two) times daily. 03/06/23   Atway, Rayann N, DO  ARIPiprazole  (ABILIFY ) 5 MG tablet Take 1 tablet (5 mg total) by mouth daily. 11/20/23   Ghimire, Donalda HERO, MD  B Complex-C-Folic Acid  (DIALYVITE  TABLET) TABS Take 1 tablet by mouth daily. 12/23/22   [provider]  carvedilol  (COREG ) 12.5 MG tablet Take 12.5 mg by mouth 2 (two) times daily with a meal.    [provider]  escitalopram  (LEXAPRO ) 20 MG tablet Take 0.5 tablets (10 mg total) by mouth at bedtime. 11/19/23   Ghimire, Donalda HERO, MD  ferric citrate  (AURYXIA) 1 GM 210 MG(Fe) tablet Take 210 mg by mouth 3 (three) times daily with meals.    [provider]  furosemide  (LASIX ) 40 MG tablet Take 40 mg by mouth daily.    [provider]  hydrALAZINE  (APRESOLINE ) 25 MG tablet Take 25 mg by mouth 2 (two) times daily.    [provider]  isosorbide  mononitrate (IMDUR ) 30 MG 24 hr tablet Take 30 mg by mouth  daily.    [provider]  lidocaine  (LIDODERM ) 5 % Place 2 patches onto the skin daily. Remove & Discard patch within 12 hours or as directed by MD    [provider]  naloxone  (NARCAN ) nasal spray 4 mg/0.1 mL Place 0.4 mg into the nose See admin instructions.  SPRAY 1 SPRAY INTO ONE NOSTRIL AS DIRECTED AS NEEDED FOR RESCUE FOR OPIOID OVERDOSE - CALL 911 IMMEDIATELY, ADMINISTER DOSE, THEN TURN PERSON ON SIDE - IF NO RESPONSE IN 2-3 MINUTES OR PERSON RESPONDS BUT RELAPSES, REPEAT USING A NEW SPRAY DEVICE AND SPRAY INTO THE OTHER NOSTRIL AS NEEDED FOR RESCUE 01/21/23   [provider]  omeprazole  (PRILOSEC) 40 MG capsule Take 1 capsule (40 mg total) by mouth daily. Patient taking differently: Take 40 mg by mouth daily as needed. 06/06/22   Kennedy-Smith, Colleen M, NP  ondansetron  (ZOFRAN -ODT) 8 MG disintegrating tablet Take 8 mg by mouth every 6 (six) hours. 11/06/23   [provider]  oxyCODONE -acetaminophen  (PERCOCET/ROXICET) 5-325 MG tablet Take 1 tablet by mouth every 4 (four) hours as needed for severe pain (pain score 7-10). 04/06/24   Patsey Lot, MD  PSYLLIUM PO Take by mouth. 09/03/23   [provider]  rosuvastatin  (CRESTOR ) 10 MG tablet Take 10 mg by mouth daily.    [provider]  sevelamer  carbonate (RENVELA ) 800 MG tablet Take 1,600 mg by mouth 3 (three) times daily with meals. 01/06/23   [provider]  sorbitol 70 % solution Take 30 mLs by mouth 3 (three) times daily as needed (constipation). 01/20/23   [provider]  tamsulosin  (FLOMAX ) 0.4 MG CAPS capsule Take 0.4 mg by mouth at bedtime.    [provider]  Tiotropium Bromide-Olodaterol 2.5-2.5 MCG/ACT AERS Take 2 puffs by mouth in the morning. 08/07/23   [provider]    Allergies: Enalapril , Gabapentin , and Iodinated contrast media    Review of Systems  Updated Vital Signs BP 120/85   Pulse 76   Temp (!) 97.4 F (36.3 C)  (Axillary)   Resp 11   Ht 5' 11 (1.803 m)   Wt 72.1 kg   SpO2 100%   BMI 22.17 kg/m   Physical Exam Vitals and nursing note reviewed.  Cardiovascular:     Rate and Rhythm: Regular rhythm.  Neurological:     Mental Status: He is alert.     (all labs ordered are listed, but only abnormal results are displayed) Labs Reviewed  BASIC METABOLIC PANEL WITH GFR - Abnormal; Notable for the following components:      Result Value   Sodium 133 (*)    Potassium 5.9 (*)    Chloride 95 (*)    Glucose, Bld 108 (*)    BUN 77 (*)    Creatinine, Ser 6.70 (*)    GFR, Estimated 8 (*)    Anion gap 16 (*)    All other components within normal limits  CBC - Abnormal; Notable for the following components:   RDW 17.6 (*)    Platelets 107 (*)    All other components within normal limits    EKG: None  Radiology: CT Head Wo Contrast Result Date: 04/06/2024 CLINICAL DATA:  Head trauma, minor (Age >= 65y); Neck trauma (Age >= 65y) Fall. EXAM: CT HEAD WITHOUT CONTRAST CT CERVICAL SPINE WITHOUT CONTRAST TECHNIQUE: Multidetector CT imaging of the head and cervical spine was performed following the standard protocol without intravenous contrast. Multiplanar CT image reconstructions of the cervical spine were also generated. RADIATION DOSE REDUCTION: This exam was performed according to the departmental dose-optimization program which includes automated exposure control, adjustment of the mA and/or kV according to patient size and/or use of iterative reconstruction technique. COMPARISON:  None Available. FINDINGS: CT HEAD FINDINGS Brain: Patchy and confluent areas of decreased attenuation are noted throughout the deep and periventricular white matter of the cerebral hemispheres bilaterally, compatible with chronic microvascular ischemic disease. No evidence of large-territorial acute infarction. No parenchymal hemorrhage. No mass lesion. No extra-axial collection. No mass effect or midline shift. No  hydrocephalus. Basilar cisterns are patent. Vascular: No hyperdense vessel. Atherosclerotic calcifications are present within the cavernous internal carotid arteries. Skull: No acute fracture or focal lesion. Chronic right nasal bone fracture. Sinuses/Orbits: Paranasal sinuses and mastoid air cells are clear. The orbits are unremarkable. Other: None. CT CERVICAL SPINE FINDINGS Alignment: Normal. Skull base and vertebrae: Limited evaluation due to motion artifact. Multilevel severe degenerative change of the spine. Moderate to severe C3-C4, C4-C5, C5-C6, and C7-T1 osseous neural foraminal stenosis. No acute fracture. No aggressive appearing focal osseous lesion or focal pathologic process. Soft tissues and spinal canal: No prevertebral fluid or swelling. No visible canal hematoma. Upper chest: Paraseptal and centrilobular emphysematous changes. Other: None. IMPRESSION: 1. No acute intracranial abnormality. 2. No acute displaced fracture or traumatic listhesis of  the cervical spine. Limited evaluation due to motion artifact. Electronically Signed   By: Morgane  Naveau M.D.   On: 04/06/2024 22:44   CT Cervical Spine Wo Contrast Result Date: 04/06/2024 CLINICAL DATA:  Head trauma, minor (Age >= 65y); Neck trauma (Age >= 65y) Fall. EXAM: CT HEAD WITHOUT CONTRAST CT CERVICAL SPINE WITHOUT CONTRAST TECHNIQUE: Multidetector CT imaging of the head and cervical spine was performed following the standard protocol without intravenous contrast. Multiplanar CT image reconstructions of the cervical spine were also generated. RADIATION DOSE REDUCTION: This exam was performed according to the departmental dose-optimization program which includes automated exposure control, adjustment of the mA and/or kV according to patient size and/or use of iterative reconstruction technique. COMPARISON:  None Available. FINDINGS: CT HEAD FINDINGS Brain: Patchy and confluent areas of decreased attenuation are noted throughout the deep and  periventricular white matter of the cerebral hemispheres bilaterally, compatible with chronic microvascular ischemic disease. No evidence of large-territorial acute infarction. No parenchymal hemorrhage. No mass lesion. No extra-axial collection. No mass effect or midline shift. No hydrocephalus. Basilar cisterns are patent. Vascular: No hyperdense vessel. Atherosclerotic calcifications are present within the cavernous internal carotid arteries. Skull: No acute fracture or focal lesion. Chronic right nasal bone fracture. Sinuses/Orbits: Paranasal sinuses and mastoid air cells are clear. The orbits are unremarkable. Other: None. CT CERVICAL SPINE FINDINGS Alignment: Normal. Skull base and vertebrae: Limited evaluation due to motion artifact. Multilevel severe degenerative change of the spine. Moderate to severe C3-C4, C4-C5, C5-C6, and C7-T1 osseous neural foraminal stenosis. No acute fracture. No aggressive appearing focal osseous lesion or focal pathologic process. Soft tissues and spinal canal: No prevertebral fluid or swelling. No visible canal hematoma. Upper chest: Paraseptal and centrilobular emphysematous changes. Other: None. IMPRESSION: 1. No acute intracranial abnormality. 2. No acute displaced fracture or traumatic listhesis of the cervical spine. Limited evaluation due to motion artifact. Electronically Signed   By: Morgane  Naveau M.D.   On: 04/06/2024 22:44   DG Shoulder Right Result Date: 04/06/2024 CLINICAL DATA:  Fall EXAM: RIGHT SHOULDER - 2+ VIEW COMPARISON:  None FINDINGS: Right-sided central venous catheter with tip at the right atrium. Advanced degenerative changes at the [facility] joint. Limited Y-view to assess for dislocation. No definitive fracture. Mild to moderate glenohumeral degenerative change. High-riding humeral head consistent with rotator cuff disease. IMPRESSION: 1. Limited Y-view to assess for dislocation. No definitive fracture. 2. Degenerative changes. High-riding humeral head  consistent with rotator cuff disease. Electronically Signed   By: Luke Bun M.D.   On: 04/06/2024 20:58   DG Chest Portable 1 View Result Date: 04/06/2024 CLINICAL DATA:  Fall. EXAM: PORTABLE CHEST 1 VIEW COMPARISON:  Chest radiograph dated 04/01/2024. FINDINGS: Dialysis catheter in similar position. There is cardiomegaly with mild central vascular congestion. No focal consolidation, pleural effusion, or pneumothorax. Atherosclerotic calcification of the aorta. Degenerative changes of the spine. No acute osseous pathology. IMPRESSION: Cardiomegaly with mild central vascular congestion. No focal consolidation. Electronically Signed   By: Vanetta Chou M.D.   On: 04/06/2024 20:29     Procedures   Medications Ordered in the ED  fentaNYL  (SUBLIMAZE ) injection 50 mcg (has no administration in time range)  sodium zirconium cyclosilicate  (LOKELMA ) packet 10 g (has no administration in time range)  ondansetron  (ZOFRAN ) injection 4 mg (4 mg Intravenous Given 04/06/24 2214)  Medical Decision Making Amount and/or Complexity of Data Reviewed Labs: ordered. Radiology: ordered.  Risk Prescription drug management.   Patient with head injury.  Reportedly fell and had TV hit him in the head.  Also neck and back pain.  Refused cervical collar here.  However while in the CT scanner developed nausea and vomiting.  Will give pain medicine and antiemetics.  Workup reassuring.  No definite injury.  Did have initial vomiting but has improved.  Mild hyperkalemia but due for dialysis tomorrow.  Will give dose of Lokelma  here.  Has patient's wife who requests potentially some home health.  Home health orders given.  Will discharge home.     Final diagnoses:  Fall, initial encounter  Strain of right shoulder, initial encounter  Hyperkalemia    ED Discharge Orders          Ordered    oxyCODONE -acetaminophen  (PERCOCET/ROXICET) 5-325 MG tablet  Every 4 hours  PRN        04/06/24 2329    Home Health        04/06/24 2331    Face-to-face encounter (required for Medicare/Medicaid patients)       Comments: I [place] certify that this patient is under my care and that I, or a nurse practitioner or physician's assistant working with me, had a face-to-face encounter that meets the physician face-to-face encounter requirements with this patient on 04/06/2024. The encounter with the patient was in whole, or in part for the following medical condition(s) which is the primary reason for home health care (List medical condition): pain   04/06/24 2331               River [place], MD 04/06/24 2350

## 2024-04-06 NOTE — ED Notes (Signed)
 Pt lethargic, difficult to rouse. Unable to get pt to speak to this writer despite painful stimuli. Established IV and called CT to obtain imaging.

## 2024-04-06 NOTE — ED Notes (Signed)
 Pt reports doing cocaine  and marijuana today

## 2024-04-06 NOTE — Discharge Instructions (Addendum)
 Go to dialysis tomorrow morning as planned

## 2024-04-06 NOTE — ED Notes (Signed)
 Following transfer to CT table, pt alert and appropriate. Yelling at RN and stating get me bulgaria here. Now alert to baseline. Vomiting x1 in CT scanner following imaging.

## 2024-04-06 NOTE — ED Notes (Signed)
 Pt refusing the CT scan. Pt educated on importance of getting the scan. After returning to the room, pt began to vomit. MD made aware.

## 2024-04-07 ENCOUNTER — Emergency Department (HOSPITAL_COMMUNITY)

## 2024-04-07 ENCOUNTER — Encounter (HOSPITAL_COMMUNITY): Payer: Self-pay

## 2024-04-07 ENCOUNTER — Inpatient Hospital Stay (HOSPITAL_COMMUNITY): Admission: EM | Admit: 2024-04-07 | Discharge: 2024-04-15 | DRG: 091 | Disposition: A | Discharge status: Hospice

## 2024-04-07 ENCOUNTER — Other Ambulatory Visit: Payer: Self-pay

## 2024-04-07 DIAGNOSIS — I252 Old myocardial infarction: Secondary | ICD-10-CM

## 2024-04-07 DIAGNOSIS — Z681 Body mass index (BMI) 19 or less, adult: Secondary | ICD-10-CM

## 2024-04-07 DIAGNOSIS — K7460 Unspecified cirrhosis of liver: Secondary | ICD-10-CM | POA: Diagnosis present

## 2024-04-07 DIAGNOSIS — F0284 Dementia in other diseases classified elsewhere, unspecified severity, with anxiety: Secondary | ICD-10-CM | POA: Diagnosis not present

## 2024-04-07 DIAGNOSIS — R197 Diarrhea, unspecified: Secondary | ICD-10-CM | POA: Diagnosis present

## 2024-04-07 DIAGNOSIS — Z5982 Transportation insecurity: Secondary | ICD-10-CM

## 2024-04-07 DIAGNOSIS — R4182 Altered mental status, unspecified: Secondary | ICD-10-CM

## 2024-04-07 DIAGNOSIS — Z91199 Patient's noncompliance with other medical treatment and regimen due to unspecified reason: Secondary | ICD-10-CM

## 2024-04-07 DIAGNOSIS — Z515 Encounter for palliative care: Secondary | ICD-10-CM

## 2024-04-07 DIAGNOSIS — D631 Anemia in chronic kidney disease: Secondary | ICD-10-CM | POA: Diagnosis present

## 2024-04-07 DIAGNOSIS — D696 Thrombocytopenia, unspecified: Secondary | ICD-10-CM | POA: Diagnosis present

## 2024-04-07 DIAGNOSIS — D72819 Decreased white blood cell count, unspecified: Secondary | ICD-10-CM | POA: Diagnosis present

## 2024-04-07 DIAGNOSIS — F1423 Cocaine dependence with withdrawal: Secondary | ICD-10-CM | POA: Diagnosis present

## 2024-04-07 DIAGNOSIS — I2510 Atherosclerotic heart disease of native coronary artery without angina pectoris: Secondary | ICD-10-CM | POA: Diagnosis present

## 2024-04-07 DIAGNOSIS — N186 End stage renal disease: Secondary | ICD-10-CM | POA: Diagnosis present

## 2024-04-07 DIAGNOSIS — Z8673 Personal history of transient ischemic attack (TIA), and cerebral infarction without residual deficits: Secondary | ICD-10-CM

## 2024-04-07 DIAGNOSIS — I5022 Chronic systolic (congestive) heart failure: Secondary | ICD-10-CM | POA: Diagnosis present

## 2024-04-07 DIAGNOSIS — G252 Other specified forms of tremor: Secondary | ICD-10-CM | POA: Diagnosis not present

## 2024-04-07 DIAGNOSIS — F1021 Alcohol dependence, in remission: Secondary | ICD-10-CM | POA: Diagnosis present

## 2024-04-07 DIAGNOSIS — R296 Repeated falls: Secondary | ICD-10-CM | POA: Diagnosis present

## 2024-04-07 DIAGNOSIS — Z79899 Other long term (current) drug therapy: Secondary | ICD-10-CM

## 2024-04-07 DIAGNOSIS — G9349 Other encephalopathy: Secondary | ICD-10-CM | POA: Diagnosis present

## 2024-04-07 DIAGNOSIS — E440 Moderate protein-calorie malnutrition: Secondary | ICD-10-CM | POA: Diagnosis present

## 2024-04-07 DIAGNOSIS — Z781 Physical restraint status: Secondary | ICD-10-CM

## 2024-04-07 DIAGNOSIS — G8929 Other chronic pain: Secondary | ICD-10-CM | POA: Diagnosis present

## 2024-04-07 DIAGNOSIS — I10 Essential (primary) hypertension: Secondary | ICD-10-CM | POA: Diagnosis present

## 2024-04-07 DIAGNOSIS — Z992 Dependence on renal dialysis: Secondary | ICD-10-CM

## 2024-04-07 DIAGNOSIS — E872 Acidosis, unspecified: Secondary | ICD-10-CM | POA: Diagnosis present

## 2024-04-07 DIAGNOSIS — K7682 Hepatic encephalopathy: Secondary | ICD-10-CM | POA: Diagnosis present

## 2024-04-07 DIAGNOSIS — E875 Hyperkalemia: Secondary | ICD-10-CM | POA: Diagnosis present

## 2024-04-07 DIAGNOSIS — G9341 Metabolic encephalopathy: Secondary | ICD-10-CM | POA: Diagnosis present

## 2024-04-07 DIAGNOSIS — R54 Age-related physical debility: Secondary | ICD-10-CM | POA: Diagnosis present

## 2024-04-07 DIAGNOSIS — F1290 Cannabis use, unspecified, uncomplicated: Secondary | ICD-10-CM | POA: Diagnosis present

## 2024-04-07 DIAGNOSIS — I132 Hypertensive heart and chronic kidney disease with heart failure and with stage 5 chronic kidney disease, or end stage renal disease: Secondary | ICD-10-CM | POA: Diagnosis present

## 2024-04-07 DIAGNOSIS — R64 Cachexia: Secondary | ICD-10-CM | POA: Diagnosis present

## 2024-04-07 DIAGNOSIS — F17290 Nicotine dependence, other tobacco product, uncomplicated: Secondary | ICD-10-CM | POA: Diagnosis present

## 2024-04-07 DIAGNOSIS — F05 Delirium due to known physiological condition: Secondary | ICD-10-CM | POA: Diagnosis not present

## 2024-04-07 DIAGNOSIS — G928 Other toxic encephalopathy: Principal | ICD-10-CM | POA: Diagnosis present

## 2024-04-07 DIAGNOSIS — F0283 Dementia in other diseases classified elsewhere, unspecified severity, with mood disturbance: Secondary | ICD-10-CM | POA: Diagnosis not present

## 2024-04-07 DIAGNOSIS — I5020 Unspecified systolic (congestive) heart failure: Secondary | ICD-10-CM | POA: Diagnosis present

## 2024-04-07 DIAGNOSIS — I480 Paroxysmal atrial fibrillation: Secondary | ICD-10-CM | POA: Diagnosis present

## 2024-04-07 DIAGNOSIS — E162 Hypoglycemia, unspecified: Secondary | ICD-10-CM | POA: Diagnosis not present

## 2024-04-07 DIAGNOSIS — Z66 Do not resuscitate: Secondary | ICD-10-CM | POA: Diagnosis not present

## 2024-04-07 DIAGNOSIS — F1420 Cocaine dependence, uncomplicated: Secondary | ICD-10-CM | POA: Diagnosis present

## 2024-04-07 DIAGNOSIS — F332 Major depressive disorder, recurrent severe without psychotic features: Secondary | ICD-10-CM | POA: Diagnosis present

## 2024-04-07 DIAGNOSIS — Z7901 Long term (current) use of anticoagulants: Secondary | ICD-10-CM

## 2024-04-07 DIAGNOSIS — N19 Unspecified kidney failure: Principal | ICD-10-CM

## 2024-04-07 DIAGNOSIS — Z86718 Personal history of other venous thrombosis and embolism: Secondary | ICD-10-CM

## 2024-04-07 DIAGNOSIS — N2581 Secondary hyperparathyroidism of renal origin: Secondary | ICD-10-CM | POA: Diagnosis present

## 2024-04-07 DIAGNOSIS — J449 Chronic obstructive pulmonary disease, unspecified: Secondary | ICD-10-CM | POA: Diagnosis present

## 2024-04-07 DIAGNOSIS — R188 Other ascites: Secondary | ICD-10-CM | POA: Diagnosis present

## 2024-04-07 DIAGNOSIS — Z8249 Family history of ischemic heart disease and other diseases of the circulatory system: Secondary | ICD-10-CM

## 2024-04-07 DIAGNOSIS — E871 Hypo-osmolality and hyponatremia: Secondary | ICD-10-CM | POA: Diagnosis present

## 2024-04-07 DIAGNOSIS — R2689 Other abnormalities of gait and mobility: Secondary | ICD-10-CM | POA: Diagnosis present

## 2024-04-07 DIAGNOSIS — F17210 Nicotine dependence, cigarettes, uncomplicated: Secondary | ICD-10-CM | POA: Diagnosis present

## 2024-04-07 LAB — BASIC METABOLIC PANEL WITH GFR
Anion gap: 22 — ABNORMAL HIGH (ref 5–15)
BUN: 84 mg/dL — ABNORMAL HIGH (ref 8–23)
CO2: 21 mmol/L — ABNORMAL LOW (ref 22–32)
Calcium: 9.4 mg/dL (ref 8.9–10.3)
Chloride: 93 mmol/L — ABNORMAL LOW (ref 98–111)
Creatinine, Ser: 7.02 mg/dL — ABNORMAL HIGH (ref 0.61–1.24)
GFR, Estimated: 8 mL/min — ABNORMAL LOW (ref >60)
Glucose, Bld: 91 mg/dL (ref 70–99)
Potassium: 6.6 mmol/L (ref 3.5–5.1)
Sodium: 136 mmol/L (ref 135–145)

## 2024-04-07 LAB — CBC
HCT: 43.9 % (ref 39.0–52.0)
Hemoglobin: 14.5 g/dL (ref 13.0–17.0)
MCH: 26.9 pg (ref 26.0–34.0)
MCHC: 33 g/dL (ref 30.0–36.0)
MCV: 81.3 fL (ref 80.0–100.0)
Platelets: 112 K/uL — ABNORMAL LOW (ref 150–400)
RBC: 5.4 MIL/uL (ref 4.22–5.81)
RDW: 18.4 % — ABNORMAL HIGH (ref 11.5–15.5)
WBC: 3.8 K/uL — ABNORMAL LOW (ref 4.0–10.5)
nRBC: 0 % (ref 0.0–0.2)

## 2024-04-07 LAB — RENAL FUNCTION PANEL
Albumin: 2.6 g/dL — ABNORMAL LOW (ref 3.5–5.0)
Anion gap: 18 — ABNORMAL HIGH (ref 5–15)
BUN: 39 mg/dL — ABNORMAL HIGH (ref 8–23)
CO2: 21 mmol/L — ABNORMAL LOW (ref 22–32)
Calcium: 9.1 mg/dL (ref 8.9–10.3)
Chloride: 94 mmol/L — ABNORMAL LOW (ref 98–111)
Creatinine, Ser: 4.07 mg/dL — ABNORMAL HIGH (ref 0.61–1.24)
GFR, Estimated: 15 mL/min — ABNORMAL LOW (ref >60)
Glucose, Bld: 52 mg/dL — ABNORMAL LOW (ref 70–99)
Phosphorus: 4.9 mg/dL — ABNORMAL HIGH (ref 2.5–4.6)
Potassium: 4.9 mmol/L (ref 3.5–5.1)
Sodium: 133 mmol/L — ABNORMAL LOW (ref 135–145)

## 2024-04-07 LAB — CBG MONITORING, ED
Glucose-Capillary: 90 mg/dL (ref 70–99)
Glucose-Capillary: 141 mg/dL — ABNORMAL HIGH (ref 70–99)

## 2024-04-07 LAB — GLUCOSE, CAPILLARY
Glucose-Capillary: 44 mg/dL — CL (ref 70–99)
Glucose-Capillary: 48 mg/dL — ABNORMAL LOW (ref 70–99)
Glucose-Capillary: 93 mg/dL (ref 70–99)

## 2024-04-07 LAB — HEPATIC FUNCTION PANEL
ALT: 25 U/L (ref 0–44)
AST: 25 U/L (ref 15–41)
Albumin: 2.8 g/dL — ABNORMAL LOW (ref 3.5–5.0)
Alkaline Phosphatase: 113 U/L (ref 38–126)
Bilirubin, Direct: 0.2 mg/dL (ref 0.0–0.2)
Indirect Bilirubin: 0.5 mg/dL (ref 0.3–0.9)
Total Bilirubin: 0.7 mg/dL (ref 0.0–1.2)
Total Protein: 6.5 g/dL (ref 6.5–8.1)

## 2024-04-07 LAB — HEPATITIS B SURFACE ANTIGEN: Hepatitis B Surface Ag: NONREACTIVE

## 2024-04-07 MED ORDER — HEPARIN SODIUM (PORCINE) 1000 UNIT/ML DIALYSIS
20.0000 [IU]/kg | INTRAMUSCULAR | Status: DC | PRN
  Administered 2024-04-07: 1400 [IU] via INTRAVENOUS_CENTRAL

## 2024-04-07 MED ORDER — INSULIN ASPART 100 UNIT/ML IV SOLN
4.0000 [IU] | Freq: Once | INTRAVENOUS | Status: AC
  Administered 2024-04-07: 4 [IU] via INTRAVENOUS

## 2024-04-07 MED ORDER — PENTAFLUOROPROP-TETRAFLUOROETH EX AERO: 1.0000 | INHALATION_SPRAY | CUTANEOUS | Status: DC | PRN

## 2024-04-07 MED ORDER — LIDOCAINE 5 % EX PTCH
1.0000 | MEDICATED_PATCH | CUTANEOUS | Status: DC
  Administered 2024-04-08 – 2024-04-11 (×3): 1 via TRANSDERMAL
  Filled 2024-04-07 (×6): qty 1

## 2024-04-07 MED ORDER — DEXTROSE 50 % IV SOLN
1.0000 | Freq: Once | INTRAVENOUS | Status: AC
  Administered 2024-04-07: 50 mL via INTRAVENOUS
  Filled 2024-04-07: qty 50

## 2024-04-07 MED ORDER — THIAMINE HCL 100 MG/ML IJ SOLN
100.0000 mg | Freq: Every day | INTRAMUSCULAR | Status: DC
  Administered 2024-04-09: 100 mg via INTRAVENOUS
  Filled 2024-04-07: qty 2
  Filled 2024-04-07: qty 1

## 2024-04-07 MED ORDER — HYDROMORPHONE HCL 2 MG PO TABS
ORAL_TABLET | ORAL | Status: AC
  Filled 2024-04-07: qty 1

## 2024-04-07 MED ORDER — CALCIUM GLUCONATE-NACL 1-0.675 GM/50ML-% IV SOLN
1.0000 g | Freq: Once | INTRAVENOUS | Status: AC
  Administered 2024-04-07: 1000 mg via INTRAVENOUS
  Filled 2024-04-07: qty 50

## 2024-04-07 MED ORDER — CHLORHEXIDINE GLUCONATE CLOTH 2 % EX PADS
6.0000 | MEDICATED_PAD | Freq: Every day | CUTANEOUS | Status: DC
  Administered 2024-04-08 – 2024-04-11 (×3): 6 via TOPICAL

## 2024-04-07 MED ORDER — LIDOCAINE-PRILOCAINE 2.5-2.5 % EX CREA: 1.0000 | TOPICAL_CREAM | CUTANEOUS | Status: DC | PRN

## 2024-04-07 MED ORDER — ACETAMINOPHEN 500 MG PO TABS
1000.0000 mg | ORAL_TABLET | Freq: Three times a day (TID) | ORAL | Status: DC
  Administered 2024-04-07 – 2024-04-15 (×18): 1000 mg via ORAL
  Filled 2024-04-07 (×20): qty 2

## 2024-04-07 MED ORDER — ALBUTEROL SULFATE (2.5 MG/3ML) 0.083% IN NEBU
2.5000 mg | INHALATION_SOLUTION | Freq: Four times a day (QID) | RESPIRATORY_TRACT | Status: DC | PRN
  Administered 2024-04-15: 2.5 mg via RESPIRATORY_TRACT
  Filled 2024-04-07: qty 3

## 2024-04-07 MED ORDER — NEPRO/CARBSTEADY PO LIQD
237.0000 mL | Freq: Two times a day (BID) | ORAL | Status: DC
  Administered 2024-04-09 – 2024-04-15 (×7): 237 mL via ORAL
  Filled 2024-04-07: qty 237

## 2024-04-07 MED ORDER — ALBUTEROL SULFATE HFA 108 (90 BASE) MCG/ACT IN AERS: 2.0000 | INHALATION_SPRAY | Freq: Four times a day (QID) | RESPIRATORY_TRACT | Status: DC | PRN

## 2024-04-07 MED ORDER — HEPARIN SODIUM (PORCINE) 1000 UNIT/ML DIALYSIS
1000.0000 [IU] | INTRAMUSCULAR | Status: DC | PRN
  Administered 2024-04-07: 3700 [IU]

## 2024-04-07 MED ORDER — APIXABAN 5 MG PO TABS
5.0000 mg | ORAL_TABLET | Freq: Two times a day (BID) | ORAL | Status: DC
  Filled 2024-04-07: qty 1

## 2024-04-07 MED ORDER — ANTICOAGULANT SODIUM CITRATE 4% (200MG/5ML) IV SOLN
5.0000 mL | Status: DC | PRN
Start: 2024-04-07 — End: 2024-04-07

## 2024-04-07 MED ORDER — APIXABAN 5 MG PO TABS
5.0000 mg | ORAL_TABLET | Freq: Two times a day (BID) | ORAL | Status: DC
  Administered 2024-04-07 – 2024-04-15 (×15): 5 mg via ORAL
  Filled 2024-04-07 (×16): qty 1

## 2024-04-07 MED ORDER — HYDROMORPHONE HCL 2 MG PO TABS
1.0000 mg | ORAL_TABLET | ORAL | Status: DC | PRN
  Administered 2024-04-07 – 2024-04-09 (×4): 1 mg via ORAL
  Filled 2024-04-07 (×3): qty 1

## 2024-04-07 MED ORDER — MIDAZOLAM HCL 2 MG/2ML IJ SOLN
1.0000 mg | Freq: Once | INTRAMUSCULAR | Status: AC
  Administered 2024-04-07: 1 mg via INTRAVENOUS
  Filled 2024-04-07: qty 2

## 2024-04-07 MED ORDER — LIDOCAINE HCL (PF) 1 % IJ SOLN: 5.0000 mL | INTRAMUSCULAR | Status: DC | PRN

## 2024-04-07 MED ORDER — HEPARIN SODIUM (PORCINE) 1000 UNIT/ML IJ SOLN
INTRAMUSCULAR | Status: AC
  Filled 2024-04-07: qty 4

## 2024-04-07 MED ORDER — DICLOFENAC SODIUM 1 % EX GEL
2.0000 g | Freq: Four times a day (QID) | CUTANEOUS | Status: DC
  Administered 2024-04-07 – 2024-04-15 (×17): 2 g via TOPICAL
  Filled 2024-04-07 (×2): qty 100

## 2024-04-07 MED ORDER — HEPARIN SODIUM (PORCINE) 1000 UNIT/ML IJ SOLN
INTRAMUSCULAR | Status: AC
  Filled 2024-04-07: qty 2

## 2024-04-07 MED ORDER — HYDROXYZINE HCL 25 MG PO TABS
25.0000 mg | ORAL_TABLET | Freq: Once | ORAL | Status: AC
  Administered 2024-04-07: 25 mg via ORAL
  Filled 2024-04-07: qty 1

## 2024-04-07 MED ORDER — ALTEPLASE 2 MG IJ SOLR: 2.0000 mg | Freq: Once | INTRAMUSCULAR | Status: DC | PRN

## 2024-04-07 MED ORDER — UMECLIDINIUM BROMIDE 62.5 MCG/ACT IN AEPB
1.0000 | INHALATION_SPRAY | Freq: Every day | RESPIRATORY_TRACT | Status: DC
  Administered 2024-04-08 – 2024-04-15 (×6): 1 via RESPIRATORY_TRACT
  Filled 2024-04-07: qty 7

## 2024-04-07 MED ORDER — ARFORMOTEROL TARTRATE 15 MCG/2ML IN NEBU
15.0000 ug | INHALATION_SOLUTION | Freq: Two times a day (BID) | RESPIRATORY_TRACT | Status: DC
  Administered 2024-04-07 – 2024-04-15 (×13): 15 ug via RESPIRATORY_TRACT
  Filled 2024-04-07 (×15): qty 2

## 2024-04-07 MED ORDER — DEXTROSE 5 % IV SOLN: INTRAVENOUS | Status: DC

## 2024-04-07 NOTE — ED Provider Notes (Signed)
 [place] EMERGENCY DEPARTMENT AT [facility] Provider Note   CSN: [id number] Arrival date & time: 04/07/24  9190     Patient presents with: Ronald Reeves   Loreto Loescher is a 68 y.o. male.    Fall  Patient is a 68 year old male with a past medical history significant for 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  Seems that he has been progressively more fatigued and somewhat more confused since leaving the hospital and wife had patient transported back to emergency department via EMS because of him sliding out of his chair to the ground.  Seems that his confusion has been slowly progressively worsening.  Wife informs me that he had a partial dialysis session on Tuesday.      Prior to Admission medications   Medication Sig Start Date End Date Taking? Authorizing Provider  amLODipine  (NORVASC ) 10 MG tablet Take 1 tablet (10 mg total) by mouth daily. Patient taking differently: Take 10 mg by mouth daily. Hold for systolic BP <100 4/75/80  Yes Crenshaw, Redell RAMAN, MD  apixaban  (ELIQUIS ) 5 MG TABS tablet Take 1 tablet (5 mg total) by mouth 2 (two) times daily. 03/06/23  Yes Atway, Rayann N, DO  ARIPiprazole  (ABILIFY ) 5 MG tablet Take 1 tablet (5 mg total) by mouth daily. 11/20/23  Yes Ghimire, Donalda HERO, MD  B Complex-C-Folic Acid  (DIALYVITE  TABLET) TABS Take 1 tablet by mouth daily. 12/23/22  Yes [provider]  carvedilol  (COREG ) 12.5 MG tablet Take 12.5 mg by mouth 2 (two) times daily with a meal.   Yes [provider]  escitalopram  (LEXAPRO ) 10 MG tablet Take 5-10 mg by mouth See admin instructions. Take 5 mg by mouth daily for 10 days then increase to 10 mg once daily.   Yes  [provider]  ferric citrate  (AURYXIA) 1 GM 210 MG(Fe) tablet Take 210 mg by mouth 3 (three) times daily with meals.   Yes [provider]  furosemide  (LASIX ) 40 MG tablet Take 40 mg by mouth daily.   Yes [provider]  HYDROcodone -acetaminophen  (NORCO) 10-325 MG tablet SMARTSIG:1 Tablet(s) By Mouth Every 8-12 Hours PRN 02/18/24  Yes [provider]  isosorbide  mononitrate (IMDUR ) 30 MG 24 hr tablet Take 30 mg by mouth daily.   Yes [provider]  mirtazapine  (REMERON ) 15 MG tablet Take 15 mg by mouth at bedtime.   Yes [provider]  mometasone [facility]) 220 MCG/ACT inhaler Inhale 2 puffs into the lungs daily.   Yes [provider]  PSYLLIUM PO Take 5 mLs by mouth daily. 09/03/23  Yes [provider]  rosuvastatin  (CRESTOR ) 10 MG tablet Take 10 mg by mouth at bedtime.   Yes [provider]  Tiotropium Bromide-Olodaterol 2.5-2.5 MCG/ACT AERS Take 2 puffs by mouth in the morning. 08/07/23  Yes [provider]  albuterol  (VENTOLIN  HFA) 108 (90 Base) MCG/ACT inhaler Inhale 2 puffs into the lungs every 6 (six) hours as needed (COPD).    [provider]  escitalopram  (LEXAPRO ) 20 MG tablet Take 0.5 tablets (10 mg total) by mouth at bedtime. 11/19/23   Ghimire, Donalda HERO, MD  hydrALAZINE  (APRESOLINE ) 25 MG tablet Take 25 mg by mouth 2 (two) times daily.    [provider]  lidocaine  (LIDODERM ) 5 % Place 2 patches onto the skin daily. Remove & Discard patch within 12 hours or as directed by MD    [provider]  naloxone  (NARCAN ) nasal spray 4 mg/0.1 mL Place 0.4 mg into the nose See admin instructions.  SPRAY 1 SPRAY INTO ONE NOSTRIL AS DIRECTED AS NEEDED FOR RESCUE FOR OPIOID OVERDOSE - CALL 911 IMMEDIATELY, ADMINISTER DOSE, THEN TURN PERSON ON SIDE - IF NO RESPONSE IN 2-3 MINUTES OR PERSON RESPONDS BUT RELAPSES, REPEAT USING A NEW SPRAY DEVICE AND SPRAY INTO THE OTHER NOSTRIL AS NEEDED  FOR RESCUE 01/21/23   [provider]  omeprazole  (PRILOSEC) 40 MG capsule Take 1 capsule (40 mg total) by mouth daily. Patient taking differently: Take 40 mg by mouth daily as needed. 06/06/22   Kennedy-Smith, Colleen M, NP  ondansetron  (ZOFRAN -ODT) 8 MG disintegrating tablet Take 8 mg by mouth every 6 (six) hours. 11/06/23   [provider]  oxyCODONE -acetaminophen  (PERCOCET/ROXICET) 5-325 MG tablet Take 1 tablet by mouth every 4 (four) hours as needed for severe pain (pain score 7-10). 04/06/24   Patsey Lot, MD  sevelamer  carbonate (RENVELA ) 800 MG tablet Take 1,600 mg by mouth 3 (three) times daily with meals. 01/06/23   [provider]  sorbitol 70 % solution Take 30 mLs by mouth 3 (three) times daily as needed (constipation). 01/20/23   [provider]  tamsulosin  (FLOMAX ) 0.4 MG CAPS capsule Take 0.4 mg by mouth at bedtime.    [provider]    Allergies: Enalapril , Gabapentin , and Iodinated contrast media    Review of Systems  Updated Vital Signs BP 131/74 (BP Location: Right Arm)   Pulse 66   Temp (!) 97.3 F (36.3 C) (Axillary)   Resp 15   Ht 5' 11 (1.803 m)   Wt 72 kg   SpO2 98%   BMI 22.14 kg/m   Physical Exam Vitals and nursing note reviewed.  Constitutional:      General: He is not in acute distress. HENT:     Head: Normocephalic and atraumatic.     Nose: Nose normal.     Mouth/Throat:     Mouth: Mucous membranes are moist.  Eyes:     General: No scleral icterus. Cardiovascular:     Rate and Rhythm: Normal rate and regular rhythm.     Pulses: Normal pulses.     Heart sounds: Normal heart sounds.  Pulmonary:     Effort: Pulmonary effort is normal. No respiratory distress.     Breath sounds: No wheezing.     Comments: Dialysis catheter and chest wall Abdominal:     Palpations: Abdomen is soft.     Tenderness: There is no abdominal tenderness. There is no guarding or rebound.  Musculoskeletal:     Cervical  back: Normal range of motion.     Right lower leg: No edema.     Left lower leg: No edema.  Skin:    General: Skin is warm and dry.     Capillary Refill: Capillary refill takes less than 2 seconds.  Neurological:     Mental Status: He is alert.     Comments: Patient answering questions and following commands however is only oriented to self and city but also not answering other orientation questions at this time.  Smile symmetric, moves all 4 extremities.  Sensation normal in all 4 extremities  Psychiatric:        Mood and Affect: Mood normal.  Behavior: Behavior normal.     (all labs ordered are listed, but only abnormal results are displayed) Labs Reviewed  CBC - Abnormal; Notable for the following components:      Result Value   WBC 3.8 (*)    RDW 18.4 (*)    Platelets 112 (*)    All other components within normal limits  BASIC METABOLIC PANEL WITH GFR - Abnormal; Notable for the following components:   Potassium 6.6 (*)    Chloride 93 (*)    CO2 21 (*)    BUN 84 (*)    Creatinine, Ser 7.02 (*)    GFR, Estimated 8 (*)    Anion gap 22 (*)    All other components within normal limits  CBG MONITORING, ED - Abnormal; Notable for the following components:   Glucose-Capillary 141 (*)    All other components within normal limits  HEPATITIS B SURFACE ANTIGEN  HEPATITIS B SURFACE ANTIBODY, QUANTITATIVE  HEPATIC FUNCTION PANEL  RAPID URINE DRUG SCREEN, HOSP PERFORMED  URINALYSIS, ROUTINE W REFLEX MICROSCOPIC  CBG MONITORING, ED    EKG: EKG Interpretation Date/Time:  Thursday April 07 2024 09:06:03 EDT Ventricular Rate:  67 PR Interval:  212 QRS Duration:  123 QT Interval:  428 QTC Calculation: 452 R Axis:   232  Text Interpretation: Sinus rhythm Borderline prolonged PR interval Nonspecific intraventricular conduction delay Probable anterolateral infarct, age indeterm Artifact No significant change since last tracing Abnormal ECG Confirmed by Garrick Charleston  [phone]) on 04/07/2024 10:54:48 AM  Radiology: DG Chest Portable 1 View Result Date: 04/07/2024 EXAM: 1 VIEW XRAY OF THE CHEST 04/07/2024 11:04:35 AM COMPARISON: 04/06/2024 CLINICAL HISTORY: SOB, dialysis. Per chart - Pt here for a unwitnessed fall. Pt unsure if he hit his head. Denies LOC. No lacerations or abrasions noted to head. Pt is on eliquis  and plavix. Axox4. Pts last full dialysis treatment was yesterday per pt. Pt states he did cocaine  last night. FINDINGS: LUNGS AND PLEURA: Left basilar airspace opacities are improving. No pleural effusion. No pneumothorax. HEART AND MEDIASTINUM: The heart is enlarged. Atherosclerotic changes are again noted at the aortic arch. BONES AND SOFT TISSUES: No acute osseous abnormality. LINES AND TUBES: A right IJ dialysis catheter is stable and in appropriate position. IMPRESSION: 1. Improving left basilar airspace opacities. 2. Enlarged heart. 3. Atherosclerotic changes at the aortic arch. Electronically signed by: [name] Necessary MD 04/07/2024 11:13 AM EDT RP Workstation: [id number]   CT Head Wo Contrast Result Date: 04/07/2024 CLINICAL DATA:  Head trauma, minor (Age >= 65y) EXAM: CT HEAD WITHOUT CONTRAST TECHNIQUE: Contiguous axial images were obtained from the base of the skull through the vertex without intravenous contrast. RADIATION DOSE REDUCTION: This exam was performed according to the departmental dose-optimization program which includes automated exposure control, adjustment of the mA and/or kV according to patient size and/or use of iterative reconstruction technique. COMPARISON:  None Available. FINDINGS: Brain: No evidence of acute infarction, hemorrhage, hydrocephalus, extra-axial collection or mass lesion/mass effect. Patchy white matter hypodensities, compatible with chronic microvascular ischemic disease. Vascular: Calcific atherosclerosis. Skull: No acute fracture. Sinuses/Orbits: Mostly clear sinuses.  No orbital fracture. IMPRESSION: No evidence  of acute intracranial abnormality. Electronically Signed   By: Gilmore GORMAN Molt M.D.   On: 04/07/2024 09:53   CT Head Wo Contrast Result Date: 04/06/2024 CLINICAL DATA:  Head trauma, minor (Age >= 65y); Neck trauma (Age >= 65y) Fall. EXAM: CT HEAD WITHOUT CONTRAST CT CERVICAL SPINE WITHOUT CONTRAST TECHNIQUE: Multidetector CT imaging of the head and  cervical spine was performed following the standard protocol without intravenous contrast. Multiplanar CT image reconstructions of the cervical spine were also generated. RADIATION DOSE REDUCTION: This exam was performed according to the departmental dose-optimization program which includes automated exposure control, adjustment of the mA and/or kV according to patient size and/or use of iterative reconstruction technique. COMPARISON:  None Available. FINDINGS: CT HEAD FINDINGS Brain: Patchy and confluent areas of decreased attenuation are noted throughout the deep and periventricular white matter of the cerebral hemispheres bilaterally, compatible with chronic microvascular ischemic disease. No evidence of large-territorial acute infarction. No parenchymal hemorrhage. No mass lesion. No extra-axial collection. No mass effect or midline shift. No hydrocephalus. Basilar cisterns are patent. Vascular: No hyperdense vessel. Atherosclerotic calcifications are present within the cavernous internal carotid arteries. Skull: No acute fracture or focal lesion. Chronic right nasal bone fracture. Sinuses/Orbits: Paranasal sinuses and mastoid air cells are clear. The orbits are unremarkable. Other: None. CT CERVICAL SPINE FINDINGS Alignment: Normal. Skull base and vertebrae: Limited evaluation due to motion artifact. Multilevel severe degenerative change of the spine. Moderate to severe C3-C4, C4-C5, C5-C6, and C7-T1 osseous neural foraminal stenosis. No acute fracture. No aggressive appearing focal osseous lesion or focal pathologic process. Soft tissues and spinal canal: No  prevertebral fluid or swelling. No visible canal hematoma. Upper chest: Paraseptal and centrilobular emphysematous changes. Other: None. IMPRESSION: 1. No acute intracranial abnormality. 2. No acute displaced fracture or traumatic listhesis of the cervical spine. Limited evaluation due to motion artifact. Electronically Signed   By: Morgane  Naveau M.D.   On: 04/06/2024 22:44   CT Cervical Spine Wo Contrast Result Date: 04/06/2024 CLINICAL DATA:  Head trauma, minor (Age >= 65y); Neck trauma (Age >= 65y) Fall. EXAM: CT HEAD WITHOUT CONTRAST CT CERVICAL SPINE WITHOUT CONTRAST TECHNIQUE: Multidetector CT imaging of the head and cervical spine was performed following the standard protocol without intravenous contrast. Multiplanar CT image reconstructions of the cervical spine were also generated. RADIATION DOSE REDUCTION: This exam was performed according to the departmental dose-optimization program which includes automated exposure control, adjustment of the mA and/or kV according to patient size and/or use of iterative reconstruction technique. COMPARISON:  None Available. FINDINGS: CT HEAD FINDINGS Brain: Patchy and confluent areas of decreased attenuation are noted throughout the deep and periventricular white matter of the cerebral hemispheres bilaterally, compatible with chronic microvascular ischemic disease. No evidence of large-territorial acute infarction. No parenchymal hemorrhage. No mass lesion. No extra-axial collection. No mass effect or midline shift. No hydrocephalus. Basilar cisterns are patent. Vascular: No hyperdense vessel. Atherosclerotic calcifications are present within the cavernous internal carotid arteries. Skull: No acute fracture or focal lesion. Chronic right nasal bone fracture. Sinuses/Orbits: Paranasal sinuses and mastoid air cells are clear. The orbits are unremarkable. Other: None. CT CERVICAL SPINE FINDINGS Alignment: Normal. Skull base and vertebrae: Limited evaluation due to  motion artifact. Multilevel severe degenerative change of the spine. Moderate to severe C3-C4, C4-C5, C5-C6, and C7-T1 osseous neural foraminal stenosis. No acute fracture. No aggressive appearing focal osseous lesion or focal pathologic process. Soft tissues and spinal canal: No prevertebral fluid or swelling. No visible canal hematoma. Upper chest: Paraseptal and centrilobular emphysematous changes. Other: None. IMPRESSION: 1. No acute intracranial abnormality. 2. No acute displaced fracture or traumatic listhesis of the cervical spine. Limited evaluation due to motion artifact. Electronically Signed   By: Morgane  Naveau M.D.   On: 04/06/2024 22:44   DG Shoulder Right Result Date: 04/06/2024 CLINICAL DATA:  Fall EXAM: RIGHT SHOULDER - 2+  VIEW COMPARISON:  None FINDINGS: Right-sided central venous catheter with tip at the right atrium. Advanced degenerative changes at the [facility] joint. Limited Y-view to assess for dislocation. No definitive fracture. Mild to moderate glenohumeral degenerative change. High-riding humeral head consistent with rotator cuff disease. IMPRESSION: 1. Limited Y-view to assess for dislocation. No definitive fracture. 2. Degenerative changes. High-riding humeral head consistent with rotator cuff disease. Electronically Signed   By: Luke Bun M.D.   On: 04/06/2024 20:58   DG Chest Portable 1 View Result Date: 04/06/2024 CLINICAL DATA:  Fall. EXAM: PORTABLE CHEST 1 VIEW COMPARISON:  Chest radiograph dated 04/01/2024. FINDINGS: Dialysis catheter in similar position. There is cardiomegaly with mild central vascular congestion. No focal consolidation, pleural effusion, or pneumothorax. Atherosclerotic calcification of the aorta. Degenerative changes of the spine. No acute osseous pathology. IMPRESSION: Cardiomegaly with mild central vascular congestion. No focal consolidation. Electronically Signed   By: Vanetta Chou M.D.   On: 04/06/2024 20:29     .Critical Care  Performed by:  Neldon Hamp RAMAN, PA Authorized by: Neldon Hamp RAMAN, PA   Critical care provider statement:    Critical care time (minutes):  31   Critical care was necessary to treat or prevent imminent or life-threatening deterioration of the following conditions:  Metabolic crisis   Critical care was time spent personally by me on the following activities:  Development of treatment plan with patient or surrogate, discussions with consultants, evaluation of patient's response to treatment, examination of patient, ordering and review of laboratory studies, ordering and review of radiographic studies, ordering and performing treatments and interventions, pulse oximetry, re-evaluation of patient's condition and review of old charts    Medications Ordered in the ED  Chlorhexidine  Gluconate Cloth 2 % PADS 6 each (has no administration in time range)  pentafluoroprop-tetrafluoroeth (GEBAUERS) aerosol 1 Application (has no administration in time range)  lidocaine  (PF) (XYLOCAINE ) 1 % injection 5 mL (has no administration in time range)  lidocaine -prilocaine  (EMLA ) cream 1 Application (has no administration in time range)  heparin  injection 1,000 Units (has no administration in time range)  anticoagulant sodium citrate  solution 5 mL (has no administration in time range)  alteplase  (CATHFLO ACTIVASE ) injection 2 mg (has no administration in time range)  heparin  injection 1,400 Units (has no administration in time range)  apixaban  (ELIQUIS ) tablet 5 mg (has no administration in time range)  acetaminophen  (TYLENOL ) tablet 1,000 mg (has no administration in time range)  lidocaine  (LIDODERM ) 5 % 1 patch (has no administration in time range)  diclofenac  Sodium (VOLTAREN ) 1 % topical gel 2 g (has no administration in time range)  arformoterol  (BROVANA ) nebulizer solution 15 mcg (has no administration in time range)    And  umeclidinium bromide  (INCRUSE ELLIPTA ) 62.5 MCG/ACT 1 puff (has no administration in time range)   albuterol  (PROVENTIL ) (2.5 MG/3ML) 0.083% nebulizer solution 2.5 mg (has no administration in time range)  midazolam  (VERSED ) injection 1 mg (1 mg Intravenous Given 04/07/24 0856)  calcium  gluconate 1 g/ 50 mL sodium chloride  IVPB (0 mg Intravenous Stopped 04/07/24 1237)  insulin  aspart (novoLOG ) injection 4 Units (4 Units Intravenous Given 04/07/24 1210)    And  dextrose  50 % solution 50 mL (50 mLs Intravenous Given 04/07/24 1209)    Clinical Course as of 04/07/24 1329  Thu Apr 07, 2024  1143 Discussed case with internal medicine teaching service who will admit for altered mental status, hyperkalemia, volume overload in the setting of ESRD [WF]    Clinical Course User  Index [WF] Neldon Hamp RAMAN, PA                                 Medical Decision Making Amount and/or Complexity of Data Reviewed Labs: ordered. Radiology: ordered.  Risk OTC drugs. Prescription drug management. Decision regarding hospitalization.    This patient presents to the ED for concern of AMS, this involves a number of treatment options, and is a complaint that carries with it a high risk of complications and morbidity. A differential diagnosis was considered for the patient's symptoms which is discussed below:   The differential diagnosis for AMS is extensive and includes, but is not limited to: drug overdose - opioids, alcohol, sedatives, antipsychotics, drug withdrawal, others; Metabolic: hypoxia, hypoglycemia, hyperglycemia, hypercalcemia, hypernatremia, hyponatremia, uremia, hepatic encephalopathy, hypothyroidism, hyperthyroidism, vitamin B12 or thiamine  deficiency, carbon monoxide poisoning, Wilson's disease, Lactic acidosis, DKA/HHOS; Infectious: meningitis, encephalitis, bacteremia/sepsis, urinary tract infection, pneumonia, neurosyphilis; Structural: Space-occupying lesion, (brain tumor, subdural hematoma, hydrocephalus,); Vascular: stroke, subarachnoid hemorrhage, coronary ischemia, hypertensive  encephalopathy, CNS vasculitis, thrombotic thrombocytopenic purpura, disseminated intravascular coagulation, hyperviscosity; Psychiatric: Schizophrenia, depression; Other: Seizure, hypothermia, heat stroke, dementia    Co morbidities: Discussed in HPI   Brief History:  Patient is a 68 year old male with a past medical history significant for 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  Seems that he has been progressively more fatigued and somewhat more confused since leaving the hospital and wife had patient transported back to emergency department via EMS because of him sliding out of his chair to the ground.  Seems that his confusion has been slowly progressively worsening.  Wife informs me that he had a partial dialysis session on Tuesday.    EMR reviewed including pt PMHx, past surgical history and past visits to ER.   See HPI for more details   Lab Tests:  Patient with potassium of 6.6  Patient given 1 g calcium  gluconate, insulin , dextrose  I did dose insulin  at 4 units given that patient is a renal patient.  1 mg Versed  for agitation.  CBC with mild leukopenia    Imaging Studies:  Abnormal findings. I personally reviewed all imaging studies. Imaging notable for enlarged heart on chest x-ray  CT head unremarkable  Cardiac Monitoring:  The patient was maintained on a cardiac monitor.  I personally viewed and interpreted the cardiac monitored which showed an underlying rhythm of: NSR EKG non-ischemic    Medicines ordered:  I ordered medication including calcium  gluconate, insulin , dextrose , Versed  for hyperkalemia Reevaluation of the patient after these medicines showed that the  patient stayed the same I have reviewed the patients home medicines and have made adjustments as needed   Critical Interventions:     Consults/Attending Physician   I discussed this case with my attending physician who cosigned this note including patient's presenting symptoms, physical exam, and planned diagnostics and interventions. Attending physician stated agreement with plan or made changes to plan which were implemented.   Discussed with nephrology who will evaluate and dialyze  Discussed with internal medicine teaching service who will admit  Reevaluation:  After the interventions noted above I re-evaluated patient and found that they have :stayed the same  Social Determinants of Health:  The patient's social determinants of health were a factor in the care of this patient    Problem List / ED Course:  ESRD with altered mental status likely secondary to uremia Hyperkalemia treated with calcium  gluconate insulin  dextrose  infusion.   Dispostion:  After consideration of the diagnostic results and the patients response to treatment, I feel that the patent would benefit from admission  Final diagnoses:  Uremia  Altered mental status, unspecified altered mental status type  Hyperkalemia    ED Discharge Orders     None          Neldon Hamp RAMAN, [place] 04/07/24 1338    Garrick Charleston, MD 04/07/24 1550

## 2024-04-07 NOTE — ED Notes (Addendum)
 Pt's wife decided to cancelled PTAR d/t having to wait too long. Pt states she takes pt dialysis by herself and can take pt home. This nurse and NT helped pt onto wheelchair and in car.

## 2024-04-07 NOTE — ED Notes (Signed)
 CCMD called to place patient on cardiac monitoring.

## 2024-04-07 NOTE — H&P (Cosign Needed Addendum)
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 Medicine Resident, PGY-3 Please contact the on call pager at [phone] for any urgent or emergent needs. 1:47 PM 04/07/2024

## 2024-04-07 NOTE — Progress Notes (Signed)
 Pt oriented to name, oriented to place, pt is disoriented to situation. Attempted to place cardiac leads on patient and pt removed them from chest. Pt taking mask off while performing HD cathter care. NP-Ejigiri notified.

## 2024-04-07 NOTE — ED Triage Notes (Addendum)
 Pt here for a unwitnessed fall. Pt unsure if he hit his head. Denies LOC.  No lacerations or abrasions noted to head. Pt is on eliquis  and plavix. Axox4. Pts last full dialysis treatment was yesterday per pt. Pt states he did cocaine  last night.

## 2024-04-07 NOTE — Consult Note (Signed)
 [facility] Renal Consultation Note    Indication for Consultation:  Management of ESRD/hemodialysis; anemia, hypertension/volume and secondary hyperparathyroidism   HPI: Ronald Reeves is a 68 y.o. male with ESRD on HD TTS, HTN, PAFib on Eliquis , HFrEF 20%, polysubstance abuse who presented to the ED last night after a fall at home. No acute findings on imaging. Labs this am notable for hyperkalemia, K 6.6.  Calcium  gluconate, insulin  have been ordered in the ED. Nephrology consulted for dialysis.   Dialysis via [facility]. Last dialysis was on Tuesday for 2 hr 30 mins. Frequently cuts dialysis treatments short. Prior 3 treatments have been less than 2 hours.   Seen and examined in the ED. He is alone. He is disoriented and not able to provide much history. Falls asleep during questioning.   Past Medical History:  Diagnosis Date   Anemia    Anxiety    Arthritis    Back pain    Bronchitis    COPD (chronic obstructive pulmonary disease) (HCC)    Coronary artery disease    COVID    mild - flu like symptoms   Dyspnea    w/ exertion, uses inhaler   ESRD on hemodialysis (HCC)    dialysis on tues, thurs, sat at [facility]   GERD (gastroesophageal reflux disease)    not a current problem   Heart murmur    never has caused any problems   Hypertension    Myocardial infarction (HCC)    Pneumonia    x 1   Pre-diabetes    diet controlled, no meds, does not check blood sugar   Substance abuse [facility])    Past Surgical History:  Procedure Laterality Date   A/V FISTULAGRAM Right 06/17/2023   Procedure: A/V Fistulagram;  Surgeon: Lanis Fonda BRAVO, MD;  Location: [facility] INVASIVE CV LAB;  Service: Cardiovascular;  Laterality: Right;   A/V FISTULAGRAM Right 07/17/2023   Procedure: A/V Fistulagram;  Surgeon: Magda Debby SAILOR, MD;  Location: [facility] INVASIVE CV LAB;  Service: Cardiovascular;  Laterality: Right;   A/V SHUNT INTERVENTION N/A 12/10/2023   Procedure: A/V SHUNT INTERVENTION;  Surgeon: Tobie Gordy POUR,  MD;  Location: [facility] INVASIVE CV LAB;  Service: Cardiovascular;  Laterality: N/A;   AV FISTULA PLACEMENT Left 04/12/2021   Procedure: LEFT ARM ARTERIOVENOUS (AV) FISTULA CREATION;  Surgeon: Gretta [name] PARAS, MD;  Location: [facility] OR;  Service: Vascular;  Laterality: Left;   AV FISTULA PLACEMENT Left 10/18/2021   Procedure: LEFT ARM BRACHIOBASILIC FISTULA CREATION FIRST STAGE;  Surgeon: Gretta [name] PARAS, MD;  Location: [facility] OR;  Service: Vascular;  Laterality: Left;   AV FISTULA PLACEMENT Right 11/28/2022   Procedure: RIGHT ARM FIRST STAGE BRACHIOBASILIC FISTULA CREATION;  Surgeon: Gretta [name] PARAS, MD;  Location: [facility] OR;  Service: Vascular;  Laterality: Right;   AV FISTULA PLACEMENT Right 10/19/2023   Procedure: RIGHT ARM ARTERIOVENOUS GRAFT CREATION;  Surgeon: Magda Debby SAILOR, MD;  Location: [facility] OR;  Service: Vascular;  Laterality: Right;   BACK SURGERY     BASCILIC VEIN TRANSPOSITION Left 01/27/2022   Procedure: LEFT SECOND STAGE BASILIC VEIN TRANSPOSITION;  Surgeon: Gretta [name] PARAS, MD;  Location: [facility] OR;  Service: Vascular;  Laterality: Left;   BASCILIC VEIN TRANSPOSITION Right 02/18/2023   Procedure: SECOND STAGE RIGHT ARM BASILIC VEIN TRANSPOSITION;  Surgeon: Gretta [name] PARAS, MD;  Location: [facility] OR;  Service: Vascular;  Laterality: Right;   BIOPSY  08/20/2022   Procedure: BIOPSY;  Surgeon: San Sandor GAILS, DO;  Location: [facility];  Service: Gastroenterology;;   COLONOSCOPY WITH PROPOFOL  N/A 08/20/2022   Procedure: COLONOSCOPY WITH PROPOFOL ;  Surgeon: San Sandor GAILS, DO;  Location: [facility];  Service: Gastroenterology;  Laterality: N/A;   DIALYSIS/PERMA CATHETER INSERTION Right 03/05/2023   Procedure: DIALYSIS/PERMA CATHETER INSERTION;  Surgeon: Gretta [name] PARAS, MD;  Location: [facility] INVASIVE CV LAB;  Service: Cardiovascular;  Laterality: Right;   DIALYSIS/PERMA CATHETER INSERTION  12/10/2023   Procedure: DIALYSIS/PERMA CATHETER INSERTION;  Surgeon: Tobie Gordy POUR, MD;  Location:  [facility] INVASIVE CV LAB;  Service: Cardiovascular;;   DIALYSIS/PERMA CATHETER INSERTION N/A 01/21/2024   Procedure: DIALYSIS/PERMA CATHETER INSERTION;  Surgeon: Serene Gaile ORN, MD;  Location: [facility];  Service: Cardiovascular;  Laterality: N/A;   ESOPHAGOGASTRODUODENOSCOPY (EGD) WITH PROPOFOL  N/A 08/20/2022   Procedure: ESOPHAGOGASTRODUODENOSCOPY (EGD) WITH PROPOFOL ;  Surgeon: San Sandor GAILS, DO;  Location: [facility];  Service: Gastroenterology;  Laterality: N/A;   INSERTION OF DIALYSIS CATHETER Right 05/01/2022   Procedure: INSERTION OF DIALYSIS CATHETER;  Surgeon: Magda Debby SAILOR, MD;  Location: [facility] OR;  Service: Vascular;  Laterality: Right;   INSERTION OF DIALYSIS CATHETER Right 07/10/2023   Procedure: ULTRASOUNDED GUIDED INSERTION OF TUNNELED  DIALYSIS CATHETER TO RIGHT FEMORAL VEIN;  Surgeon: Gretta [name] PARAS, MD;  Location: [facility] OR;  Service: Vascular;  Laterality: Right;   IR FLUORO GUIDE CV LINE RIGHT  12/07/2023   IR FLUORO GUIDE CV LINE RIGHT  12/09/2023   IR REMOVAL TUN CV CATH W/O FL  12/19/2022   IR REMOVAL TUN CV CATH W/O FL  11/18/2023   IR US  GUIDE VASC ACCESS RIGHT  12/07/2023   LIGATION OF COMPETING BRANCHES OF ARTERIOVENOUS FISTULA Left 07/15/2021   Procedure: LIGATION OF COMPETING BRANCHES OF LEFT RADIOCEPHALIC ARTERIOVENOUS FISTULA TIMES TWO;  Surgeon: Gretta [name] PARAS, MD;  Location: [facility] OR;  Service: Vascular;  Laterality: Left;   PERIPHERAL VASCULAR BALLOON ANGIOPLASTY  07/17/2023   Procedure: PERIPHERAL VASCULAR BALLOON ANGIOPLASTY;  Surgeon: Magda Debby SAILOR, MD;  Location: [facility] INVASIVE CV LAB;  Service: Cardiovascular;;   PERIPHERAL VASCULAR INTERVENTION  06/17/2023   Procedure: PERIPHERAL VASCULAR INTERVENTION;  Surgeon: Lanis Fonda BRAVO, MD;  Location: [facility] INVASIVE CV LAB;  Service: Cardiovascular;;   POLYPECTOMY  08/20/2022   Procedure: POLYPECTOMY;  Surgeon: San Sandor GAILS, DO;  Location: [facility];  Service: Gastroenterology;;   REVISON OF ARTERIOVENOUS  FISTULA Left 07/15/2021   Procedure: REVISON OF LEFT ARTERIOVENOUS FISTULA;  Surgeon: Gretta [name] PARAS, MD;  Location: [facility] OR;  Service: Vascular;  Laterality: Left;   REVISON OF ARTERIOVENOUS FISTULA Left 05/01/2022   Procedure: ARTERIOVENOUS FISTULA WASHOUT OF ARM HEMATOMA;  Surgeon: Magda Debby SAILOR, MD;  Location: [facility] OR;  Service: Vascular;  Laterality: Left;   TUNNELLED CATHETER EXCHANGE N/A 10/23/2023   Procedure: TUNNELLED CATHETER EXCHANGE;  Surgeon: Norine Manuelita LABOR, MD;  [facility] INVASIVE CV LAB;  Service: Cardiovascular;  Laterality: N/A;   UPPER EXTREMITY VENOGRAPHY Bilateral 08/28/2023   Procedure: UPPER EXTREMITY VENOGRAPHY;  Surgeon: Magda Debby SAILOR, MD;  [facility]: [facility] INVASIVE CV LAB;  Service: Cardiovascular;  Laterality: Bilateral;   Family History  Problem Relation Age of Onset   Heart disease Mother    Heart attack Sister 48   Social History:  reports that he has been smoking cigars and cigarettes. He has been exposed to tobacco smoke. He has never used smokeless tobacco. He reports that he does not currently use alcohol after a past usage of about 14.0 standard drinks of alcohol per week. He reports current drug use. Frequency:  1.00 time per week. Drugs: Marijuana and Cocaine . Allergies  Allergen Reactions   Enalapril  Other (See Comments)    Renal failure syndrome   Gabapentin      Encephalopathy, tremor   Iodinated Contrast Media Nausea And Vomiting   Prior to Admission medications   Medication Sig Start Date End Date Taking? Authorizing Provider  albuterol  (VENTOLIN  HFA) 108 (90 Base) MCG/ACT inhaler Inhale 2 puffs into the lungs every 6 (six) hours as needed (COPD).    [provider]  amLODipine  (NORVASC ) 10 MG tablet Take 1 tablet (10 mg total) by mouth daily. Patient taking differently: Take 10 mg by mouth daily. Hold for systolic BP <100 4/75/80   Pietro Redell RAMAN, MD  apixaban  (ELIQUIS ) 5 MG TABS tablet Take 1 tablet (5 mg total) by mouth 2 (two)  times daily. 03/06/23   Atway, Rayann N, DO  ARIPiprazole  (ABILIFY ) 5 MG tablet Take 1 tablet (5 mg total) by mouth daily. 11/20/23   Ghimire, Donalda HERO, MD  B Complex-C-Folic Acid  (DIALYVITE  TABLET) TABS Take 1 tablet by mouth daily. 12/23/22   [provider]  carvedilol  (COREG ) 12.5 MG tablet Take 12.5 mg by mouth 2 (two) times daily with a meal.    [provider]  escitalopram  (LEXAPRO ) 20 MG tablet Take 0.5 tablets (10 mg total) by mouth at bedtime. 11/19/23   Ghimire, Donalda HERO, MD  ferric citrate  (AURYXIA) 1 GM 210 MG(Fe) tablet Take 210 mg by mouth 3 (three) times daily with meals.    [provider]  furosemide  (LASIX ) 40 MG tablet Take 40 mg by mouth daily.    [provider]  hydrALAZINE  (APRESOLINE ) 25 MG tablet Take 25 mg by mouth 2 (two) times daily.    [provider]  isosorbide  mononitrate (IMDUR ) 30 MG 24 hr tablet Take 30 mg by mouth daily.    [provider]  lidocaine  (LIDODERM ) 5 % Place 2 patches onto the skin daily. Remove & Discard patch within 12 hours or as directed by MD    [provider]  naloxone  (NARCAN ) nasal spray 4 mg/0.1 mL Place 0.4 mg into the nose See admin instructions.  SPRAY 1 SPRAY INTO ONE NOSTRIL AS DIRECTED AS NEEDED FOR RESCUE FOR OPIOID OVERDOSE - CALL 911 IMMEDIATELY, ADMINISTER DOSE, THEN TURN PERSON ON SIDE - IF NO RESPONSE IN 2-3 MINUTES OR PERSON RESPONDS BUT RELAPSES, REPEAT USING A NEW SPRAY DEVICE AND SPRAY INTO THE OTHER NOSTRIL AS NEEDED FOR RESCUE 01/21/23   [provider]  omeprazole  (PRILOSEC) 40 MG capsule Take 1 capsule (40 mg total) by mouth daily. Patient taking differently: Take 40 mg by mouth daily as needed. 06/06/22   Kennedy-Smith, Colleen M, NP  ondansetron  (ZOFRAN -ODT) 8 MG disintegrating tablet Take 8 mg by mouth every 6 (six) hours. 11/06/23   [provider]  oxyCODONE -acetaminophen  (PERCOCET/ROXICET) 5-325 MG tablet Take 1 tablet by mouth every 4  (four) hours as needed for severe pain (pain score 7-10). 04/06/24   Patsey Lot, MD  PSYLLIUM PO Take by mouth. 09/03/23   [provider]  rosuvastatin  (CRESTOR ) 10 MG tablet Take 10 mg by mouth daily.    [provider]  sevelamer  carbonate (RENVELA ) 800 MG tablet Take 1,600 mg by mouth 3 (three) times daily with meals. 01/06/23   [provider]  sorbitol 70 % solution Take 30 mLs by mouth 3 (three) times daily as needed (constipation). 01/20/23   [provider]  tamsulosin  (FLOMAX ) 0.4 MG CAPS capsule  Take 0.4 mg by mouth at bedtime.    [provider]  Tiotropium Bromide-Olodaterol 2.5-2.5 MCG/ACT AERS Take 2 puffs by mouth in the morning. 08/07/23   [provider]   Current Facility-Administered Medications  Medication Dose Route Frequency Provider Last Rate Last Admin   calcium  gluconate 1 g/ 50 mL sodium chloride  IVPB  1 g Intravenous Once Fondaw, Wylder S, PA       Chlorhexidine  Gluconate Cloth 2 % PADS 6 each  6 each Topical Q0600 Eissa Buchberger Grace, PA-C       insulin  aspart (novoLOG ) injection 4 Units  4 Units Intravenous Once Fondaw, Wylder S, PA       And   dextrose  50 % solution 50 mL  1 ampule Intravenous Once Fondaw, Wylder S, [place]       Current Outpatient Medications  Medication Sig Dispense Refill   albuterol  (VENTOLIN  HFA) 108 (90 Base) MCG/ACT inhaler Inhale 2 puffs into the lungs every 6 (six) hours as needed (COPD).     amLODipine  (NORVASC ) 10 MG tablet Take 1 tablet (10 mg total) by mouth daily. (Patient taking differently: Take 10 mg by mouth daily. Hold for systolic BP <100) 90 tablet 3   apixaban  (ELIQUIS ) 5 MG TABS tablet Take 1 tablet (5 mg total) by mouth 2 (two) times daily.     ARIPiprazole  (ABILIFY ) 5 MG tablet Take 1 tablet (5 mg total) by mouth daily. 30 tablet 1   B Complex-C-Folic Acid  (DIALYVITE  TABLET) TABS Take 1 tablet by mouth daily.     carvedilol  (COREG ) 12.5 MG tablet Take 12.5 mg by mouth  2 (two) times daily with a meal.     escitalopram  (LEXAPRO ) 20 MG tablet Take 0.5 tablets (10 mg total) by mouth at bedtime. 30 tablet 1   ferric citrate  (AURYXIA) 1 GM 210 MG(Fe) tablet Take 210 mg by mouth 3 (three) times daily with meals.     furosemide  (LASIX ) 40 MG tablet Take 40 mg by mouth daily.     hydrALAZINE  (APRESOLINE ) 25 MG tablet Take 25 mg by mouth 2 (two) times daily.     isosorbide  mononitrate (IMDUR ) 30 MG 24 hr tablet Take 30 mg by mouth daily.     lidocaine  (LIDODERM ) 5 % Place 2 patches onto the skin daily. Remove & Discard patch within 12 hours or as directed by MD     naloxone  (NARCAN ) nasal spray 4 mg/0.1 mL Place 0.4 mg into the nose See admin instructions.  SPRAY 1 SPRAY INTO ONE NOSTRIL AS DIRECTED AS NEEDED FOR RESCUE FOR OPIOID OVERDOSE - CALL 911 IMMEDIATELY, ADMINISTER DOSE, THEN TURN PERSON ON SIDE - IF NO RESPONSE IN 2-3 MINUTES OR PERSON RESPONDS BUT RELAPSES, REPEAT USING A NEW SPRAY DEVICE AND SPRAY INTO THE OTHER NOSTRIL AS NEEDED FOR RESCUE     omeprazole  (PRILOSEC) 40 MG capsule Take 1 capsule (40 mg total) by mouth daily. (Patient taking differently: Take 40 mg by mouth daily as needed.) 30 capsule 2   ondansetron  (ZOFRAN -ODT) 8 MG disintegrating tablet Take 8 mg by mouth every 6 (six) hours.     oxyCODONE -acetaminophen  (PERCOCET/ROXICET) 5-325 MG tablet Take 1 tablet by mouth every 4 (four) hours as needed for severe pain (pain score 7-10). 4 tablet 0   PSYLLIUM PO Take by mouth.     rosuvastatin  (CRESTOR ) 10 MG tablet Take 10 mg by mouth daily.     sevelamer  carbonate (RENVELA ) 800 MG tablet Take 1,600 mg by mouth 3 (three) times daily  with meals.     sorbitol 70 % solution Take 30 mLs by mouth 3 (three) times daily as needed (constipation).     tamsulosin  (FLOMAX ) 0.4 MG CAPS capsule Take 0.4 mg by mouth at bedtime.     Tiotropium Bromide-Olodaterol 2.5-2.5 MCG/ACT AERS Take 2 puffs by mouth in the morning.       ROS: As per HPI otherwise  negative.  Physical Exam: Vitals:   04/07/24 0817 04/07/24 0820 04/07/24 0844 04/07/24 1016  BP:  (!) 116/91 123/83 131/74  Pulse:   68 66  Resp:  18 18 15   Temp:   97.6 F (36.4 C) (!) 97.3 F (36.3 C)  TempSrc:   Axillary Axillary  SpO2:   98% 98%  Weight: 72 kg     Height: 5' 11 (1.803 m)        General: Briefly alert, nad, on room air  Head: NCAT sclera not icteric MMM Neck: Supple. No JVD appreciated  Lungs: Clear bilaterally, no iwob Heart: RRR, no murmur,  Abdomen: soft non-tender  Lower extremities:  1+ pitting LE edema bilat  Neuro: Somnolent, drowsy  Psych:  Unable to answer questions  Dialysis Access: R chest [facility]   Labs: Basic Metabolic Panel: Recent Labs  Lab 04/01/24 1215 04/06/24 2157 04/07/24 0920  NA 139 133* 136  K 5.4* 5.9* 6.6*  CL 96* 95* 93*  CO2 25 22 21*  GLUCOSE 98 108* 91  BUN 73* 77* 84*  CREATININE 6.33* 6.70* 7.02*  CALCIUM  8.6* 8.9 9.4   Liver Function Tests: Recent Labs  Lab 04/01/24 1215  AST 29  ALT 23  ALKPHOS 89  BILITOT 0.9  PROT 6.6  ALBUMIN  3.0*   Recent Labs  Lab 04/01/24 1215  LIPASE 36   No results for input(s): AMMONIA in the last 168 hours. CBC: Recent Labs  Lab 04/01/24 1215 04/06/24 2157 04/07/24 0920  WBC 4.1 5.3 3.8*  HGB 15.2 13.3 14.5  HCT 46.0 40.1 43.9  MCV 81.3 80.4 81.3  PLT 99* 107* 112*   Cardiac Enzymes: No results for input(s): CKTOTAL, CKMB, CKMBINDEX, TROPONINI in the last 168 hours. CBG: No results for input(s): GLUCAP in the last 168 hours. Iron Studies: No results for input(s): IRON, TIBC, TRANSFERRIN, FERRITIN in the last 72 hours. Studies/Results: DG Chest Portable 1 View Result Date: 04/07/2024 EXAM: 1 VIEW XRAY OF THE CHEST 04/07/2024 11:04:35 AM COMPARISON: 04/06/2024 CLINICAL HISTORY: SOB, dialysis. Per chart - Pt here for a unwitnessed fall. Pt unsure if he hit his head. Denies LOC. No lacerations or abrasions noted to head. Pt is on eliquis  and  plavix. Axox4. Pts last full dialysis treatment was yesterday per pt. Pt states he did cocaine  last night. FINDINGS: LUNGS AND PLEURA: Left basilar airspace opacities are improving. No pleural effusion. No pneumothorax. HEART AND MEDIASTINUM: The heart is enlarged. Atherosclerotic changes are again noted at the aortic arch. BONES AND SOFT TISSUES: No acute osseous abnormality. LINES AND TUBES: A right IJ dialysis catheter is stable and in appropriate position. IMPRESSION: 1. Improving left basilar airspace opacities. 2. Enlarged heart. 3. Atherosclerotic changes at the aortic arch. Electronically signed by: [name] Necessary MD 04/07/2024 11:13 AM EDT RP Workstation: [id number]   CT Head Wo Contrast Result Date: 04/07/2024 CLINICAL DATA:  Head trauma, minor (Age >= 65y) EXAM: CT HEAD WITHOUT CONTRAST TECHNIQUE: Contiguous axial images were obtained from the base of the skull through the vertex without intravenous contrast. RADIATION DOSE REDUCTION: This exam was performed according to  the departmental dose-optimization program which includes automated exposure control, adjustment of the mA and/or kV according to patient size and/or use of iterative reconstruction technique. COMPARISON:  None Available. FINDINGS: Brain: No evidence of acute infarction, hemorrhage, hydrocephalus, extra-axial collection or mass lesion/mass effect. Patchy white matter hypodensities, compatible with chronic microvascular ischemic disease. Vascular: Calcific atherosclerosis. Skull: No acute fracture. Sinuses/Orbits: Mostly clear sinuses.  No orbital fracture. IMPRESSION: No evidence of acute intracranial abnormality. Electronically Signed   By: Gilmore GORMAN Molt M.D.   On: 04/07/2024 09:53   CT Head Wo Contrast Result Date: 04/06/2024 CLINICAL DATA:  Head trauma, minor (Age >= 65y); Neck trauma (Age >= 65y) Fall. EXAM: CT HEAD WITHOUT CONTRAST CT CERVICAL SPINE WITHOUT CONTRAST TECHNIQUE: Multidetector CT imaging of the head and  cervical spine was performed following the standard protocol without intravenous contrast. Multiplanar CT image reconstructions of the cervical spine were also generated. RADIATION DOSE REDUCTION: This exam was performed according to the departmental dose-optimization program which includes automated exposure control, adjustment of the mA and/or kV according to patient size and/or use of iterative reconstruction technique. COMPARISON:  None Available. FINDINGS: CT HEAD FINDINGS Brain: Patchy and confluent areas of decreased attenuation are noted throughout the deep and periventricular white matter of the cerebral hemispheres bilaterally, compatible with chronic microvascular ischemic disease. No evidence of large-territorial acute infarction. No parenchymal hemorrhage. No mass lesion. No extra-axial collection. No mass effect or midline shift. No hydrocephalus. Basilar cisterns are patent. Vascular: No hyperdense vessel. Atherosclerotic calcifications are present within the cavernous internal carotid arteries. Skull: No acute fracture or focal lesion. Chronic right nasal bone fracture. Sinuses/Orbits: Paranasal sinuses and mastoid air cells are clear. The orbits are unremarkable. Other: None. CT CERVICAL SPINE FINDINGS Alignment: Normal. Skull base and vertebrae: Limited evaluation due to motion artifact. Multilevel severe degenerative change of the spine. Moderate to severe C3-C4, C4-C5, C5-C6, and C7-T1 osseous neural foraminal stenosis. No acute fracture. No aggressive appearing focal osseous lesion or focal pathologic process. Soft tissues and spinal canal: No prevertebral fluid or swelling. No visible canal hematoma. Upper chest: Paraseptal and centrilobular emphysematous changes. Other: None. IMPRESSION: 1. No acute intracranial abnormality. 2. No acute displaced fracture or traumatic listhesis of the cervical spine. Limited evaluation due to motion artifact. Electronically Signed   By: Morgane  Naveau M.D.    On: 04/06/2024 22:44   CT Cervical Spine Wo Contrast Result Date: 04/06/2024 CLINICAL DATA:  Head trauma, minor (Age >= 65y); Neck trauma (Age >= 65y) Fall. EXAM: CT HEAD WITHOUT CONTRAST CT CERVICAL SPINE WITHOUT CONTRAST TECHNIQUE: Multidetector CT imaging of the head and cervical spine was performed following the standard protocol without intravenous contrast. Multiplanar CT image reconstructions of the cervical spine were also generated. RADIATION DOSE REDUCTION: This exam was performed according to the departmental dose-optimization program which includes automated exposure control, adjustment of the mA and/or kV according to patient size and/or use of iterative reconstruction technique. COMPARISON:  None Available. FINDINGS: CT HEAD FINDINGS Brain: Patchy and confluent areas of decreased attenuation are noted throughout the deep and periventricular white matter of the cerebral hemispheres bilaterally, compatible with chronic microvascular ischemic disease. No evidence of large-territorial acute infarction. No parenchymal hemorrhage. No mass lesion. No extra-axial collection. No mass effect or midline shift. No hydrocephalus. Basilar cisterns are patent. Vascular: No hyperdense vessel. Atherosclerotic calcifications are present within the cavernous internal carotid arteries. Skull: No acute fracture or focal lesion. Chronic right nasal bone fracture. Sinuses/Orbits: Paranasal sinuses and mastoid air cells are clear.  The orbits are unremarkable. Other: None. CT CERVICAL SPINE FINDINGS Alignment: Normal. Skull base and vertebrae: Limited evaluation due to motion artifact. Multilevel severe degenerative change of the spine. Moderate to severe C3-C4, C4-C5, C5-C6, and C7-T1 osseous neural foraminal stenosis. No acute fracture. No aggressive appearing focal osseous lesion or focal pathologic process. Soft tissues and spinal canal: No prevertebral fluid or swelling. No visible canal hematoma. Upper chest:  Paraseptal and centrilobular emphysematous changes. Other: None. IMPRESSION: 1. No acute intracranial abnormality. 2. No acute displaced fracture or traumatic listhesis of the cervical spine. Limited evaluation due to motion artifact. Electronically Signed   By: Morgane  Naveau M.D.   On: 04/06/2024 22:44   DG Shoulder Right Result Date: 04/06/2024 CLINICAL DATA:  Fall EXAM: RIGHT SHOULDER - 2+ VIEW COMPARISON:  None FINDINGS: Right-sided central venous catheter with tip at the right atrium. Advanced degenerative changes at the [facility] joint. Limited Y-view to assess for dislocation. No definitive fracture. Mild to moderate glenohumeral degenerative change. High-riding humeral head consistent with rotator cuff disease. IMPRESSION: 1. Limited Y-view to assess for dislocation. No definitive fracture. 2. Degenerative changes. High-riding humeral head consistent with rotator cuff disease. Electronically Signed   By: Luke Bun M.D.   On: 04/06/2024 20:58   DG Chest Portable 1 View Result Date: 04/06/2024 CLINICAL DATA:  Fall. EXAM: PORTABLE CHEST 1 VIEW COMPARISON:  Chest radiograph dated 04/01/2024. FINDINGS: Dialysis catheter in similar position. There is cardiomegaly with mild central vascular congestion. No focal consolidation, pleural effusion, or pneumothorax. Atherosclerotic calcification of the aorta. Degenerative changes of the spine. No acute osseous pathology. IMPRESSION: Cardiomegaly with mild central vascular congestion. No focal consolidation. Electronically Signed   By: Vanetta Chou M.D.   On: 04/06/2024 20:29    Dialysis Orders:  [facility] TTS 4:00 BFR 400 EDW 62 kg 2K/2Ca TDC - Heparin  4000 + 2000 midrun  - calcitriol  2.25 q HD, sensipar 30 q HD  Assessment/Plan: Hyperkalemia. Temporizing meds ordered. Plan for urgent dialysis today with low potassium bath.   ESRD.  HD TTS. Not adherent to full Rx as outpatient. HD today as above.   Access. TDC in use  Hypertension. BP acceptable  Volume.  UF to EDW as able  Anemia. Hgb above goal  Metabolic bone disease.  Ca acceptable. Follow if admitted.  HFrEF 20%. Optimize volume with HD as able   Maisie Ronnald Acosta PA-C [facility] 04/07/2024, 11:19 AM

## 2024-04-07 NOTE — Progress Notes (Signed)
 Pt receives out-pt HD at [facility] [place] on TTS 11:20 am chair time. Will assist as needed.   Randine Mungo Dialysis Navigator [phone]

## 2024-04-07 NOTE — ED Notes (Addendum)
 RN bladder scanned patient twice, pt showed 0ml both times

## 2024-04-08 ENCOUNTER — Inpatient Hospital Stay (HOSPITAL_COMMUNITY)

## 2024-04-08 DIAGNOSIS — E875 Hyperkalemia: Secondary | ICD-10-CM | POA: Diagnosis present

## 2024-04-08 DIAGNOSIS — G9341 Metabolic encephalopathy: Secondary | ICD-10-CM | POA: Diagnosis not present

## 2024-04-08 DIAGNOSIS — F0283 Dementia in other diseases classified elsewhere, unspecified severity, with mood disturbance: Secondary | ICD-10-CM | POA: Diagnosis not present

## 2024-04-08 DIAGNOSIS — E872 Acidosis, unspecified: Secondary | ICD-10-CM

## 2024-04-08 DIAGNOSIS — D696 Thrombocytopenia, unspecified: Secondary | ICD-10-CM | POA: Diagnosis present

## 2024-04-08 DIAGNOSIS — R4182 Altered mental status, unspecified: Secondary | ICD-10-CM | POA: Diagnosis not present

## 2024-04-08 DIAGNOSIS — F05 Delirium due to known physiological condition: Secondary | ICD-10-CM | POA: Diagnosis not present

## 2024-04-08 DIAGNOSIS — K7460 Unspecified cirrhosis of liver: Secondary | ICD-10-CM | POA: Diagnosis not present

## 2024-04-08 DIAGNOSIS — Z992 Dependence on renal dialysis: Secondary | ICD-10-CM | POA: Diagnosis not present

## 2024-04-08 DIAGNOSIS — F1490 Cocaine use, unspecified, uncomplicated: Secondary | ICD-10-CM | POA: Diagnosis not present

## 2024-04-08 DIAGNOSIS — N186 End stage renal disease: Secondary | ICD-10-CM | POA: Diagnosis present

## 2024-04-08 DIAGNOSIS — K7682 Hepatic encephalopathy: Secondary | ICD-10-CM | POA: Diagnosis present

## 2024-04-08 DIAGNOSIS — J449 Chronic obstructive pulmonary disease, unspecified: Secondary | ICD-10-CM | POA: Diagnosis present

## 2024-04-08 DIAGNOSIS — D631 Anemia in chronic kidney disease: Secondary | ICD-10-CM | POA: Diagnosis present

## 2024-04-08 DIAGNOSIS — Z515 Encounter for palliative care: Secondary | ICD-10-CM | POA: Diagnosis not present

## 2024-04-08 DIAGNOSIS — F1423 Cocaine dependence with withdrawal: Secondary | ICD-10-CM | POA: Diagnosis present

## 2024-04-08 DIAGNOSIS — D72819 Decreased white blood cell count, unspecified: Secondary | ICD-10-CM | POA: Diagnosis present

## 2024-04-08 DIAGNOSIS — E440 Moderate protein-calorie malnutrition: Secondary | ICD-10-CM | POA: Diagnosis present

## 2024-04-08 DIAGNOSIS — E871 Hypo-osmolality and hyponatremia: Secondary | ICD-10-CM | POA: Diagnosis present

## 2024-04-08 DIAGNOSIS — I132 Hypertensive heart and chronic kidney disease with heart failure and with stage 5 chronic kidney disease, or end stage renal disease: Secondary | ICD-10-CM | POA: Diagnosis present

## 2024-04-08 DIAGNOSIS — R188 Other ascites: Secondary | ICD-10-CM | POA: Diagnosis present

## 2024-04-08 DIAGNOSIS — I5020 Unspecified systolic (congestive) heart failure: Secondary | ICD-10-CM | POA: Diagnosis not present

## 2024-04-08 DIAGNOSIS — Z66 Do not resuscitate: Secondary | ICD-10-CM | POA: Diagnosis not present

## 2024-04-08 DIAGNOSIS — N19 Unspecified kidney failure: Secondary | ICD-10-CM | POA: Diagnosis not present

## 2024-04-08 DIAGNOSIS — I130 Hypertensive heart and chronic kidney disease with heart failure and stage 1 through stage 4 chronic kidney disease, or unspecified chronic kidney disease: Secondary | ICD-10-CM | POA: Diagnosis not present

## 2024-04-08 DIAGNOSIS — F332 Major depressive disorder, recurrent severe without psychotic features: Secondary | ICD-10-CM | POA: Diagnosis present

## 2024-04-08 DIAGNOSIS — E162 Hypoglycemia, unspecified: Secondary | ICD-10-CM | POA: Diagnosis present

## 2024-04-08 DIAGNOSIS — G928 Other toxic encephalopathy: Secondary | ICD-10-CM | POA: Diagnosis present

## 2024-04-08 DIAGNOSIS — J9601 Acute respiratory failure with hypoxia: Secondary | ICD-10-CM | POA: Diagnosis not present

## 2024-04-08 DIAGNOSIS — N2581 Secondary hyperparathyroidism of renal origin: Secondary | ICD-10-CM | POA: Diagnosis present

## 2024-04-08 DIAGNOSIS — I5022 Chronic systolic (congestive) heart failure: Secondary | ICD-10-CM | POA: Diagnosis present

## 2024-04-08 DIAGNOSIS — F1021 Alcohol dependence, in remission: Secondary | ICD-10-CM | POA: Diagnosis present

## 2024-04-08 DIAGNOSIS — F0284 Dementia in other diseases classified elsewhere, unspecified severity, with anxiety: Secondary | ICD-10-CM | POA: Diagnosis not present

## 2024-04-08 DIAGNOSIS — R64 Cachexia: Secondary | ICD-10-CM | POA: Diagnosis present

## 2024-04-08 LAB — CBC WITH DIFFERENTIAL/PLATELET
Abs Immature Granulocytes: 0.02 K/uL (ref 0.00–0.07)
Basophils Absolute: 0 K/uL (ref 0.0–0.1)
Basophils Relative: 0 %
Eosinophils Absolute: 0 K/uL (ref 0.0–0.5)
Eosinophils Relative: 0 %
HCT: 40.3 % (ref 39.0–52.0)
Hemoglobin: 13.8 g/dL (ref 13.0–17.0)
Immature Granulocytes: 1 %
Lymphocytes Relative: 4 %
Lymphs Abs: 0.2 K/uL — ABNORMAL LOW (ref 0.7–4.0)
MCH: 26.8 pg (ref 26.0–34.0)
MCHC: 34.2 g/dL (ref 30.0–36.0)
MCV: 78.3 fL — ABNORMAL LOW (ref 80.0–100.0)
Monocytes Absolute: 0.4 K/uL (ref 0.1–1.0)
Monocytes Relative: 9 %
Neutro Abs: 3.6 K/uL (ref 1.7–7.7)
Neutrophils Relative %: 86 %
Platelets: 135 K/uL — ABNORMAL LOW (ref 150–400)
RBC: 5.15 MIL/uL (ref 4.22–5.81)
RDW: 17.2 % — ABNORMAL HIGH (ref 11.5–15.5)
Smear Review: NORMAL
WBC: 4.2 K/uL (ref 4.0–10.5)
nRBC: 0 % (ref 0.0–0.2)

## 2024-04-08 LAB — GLUCOSE, CAPILLARY
Glucose-Capillary: 91 mg/dL (ref 70–99)
Glucose-Capillary: 72 mg/dL (ref 70–99)
Glucose-Capillary: 73 mg/dL (ref 70–99)
Glucose-Capillary: 66 mg/dL — ABNORMAL LOW (ref 70–99)
Glucose-Capillary: 107 mg/dL — ABNORMAL HIGH (ref 70–99)
Glucose-Capillary: 66 mg/dL — ABNORMAL LOW (ref 70–99)
Glucose-Capillary: 159 mg/dL — ABNORMAL HIGH (ref 70–99)
Glucose-Capillary: 114 mg/dL — ABNORMAL HIGH (ref 70–99)

## 2024-04-08 LAB — RENAL FUNCTION PANEL
Albumin: 2.4 g/dL — ABNORMAL LOW (ref 3.5–5.0)
Anion gap: 17 — ABNORMAL HIGH (ref 5–15)
BUN: 44 mg/dL — ABNORMAL HIGH (ref 8–23)
CO2: 22 mmol/L (ref 22–32)
Calcium: 8.9 mg/dL (ref 8.9–10.3)
Chloride: 95 mmol/L — ABNORMAL LOW (ref 98–111)
Creatinine, Ser: 4.62 mg/dL — ABNORMAL HIGH (ref 0.61–1.24)
GFR, Estimated: 13 mL/min — ABNORMAL LOW (ref >60)
Glucose, Bld: 81 mg/dL (ref 70–99)
Phosphorus: 6 mg/dL — ABNORMAL HIGH (ref 2.5–4.6)
Potassium: 4.8 mmol/L (ref 3.5–5.1)
Sodium: 134 mmol/L — ABNORMAL LOW (ref 135–145)

## 2024-04-08 LAB — TSH: TSH: 1.445 u[IU]/mL (ref 0.350–4.500)

## 2024-04-08 MED ORDER — ONDANSETRON HCL 4 MG/2ML IJ SOLN
4.0000 mg | Freq: Once | INTRAMUSCULAR | Status: AC
  Administered 2024-04-08: 4 mg via INTRAVENOUS
  Filled 2024-04-08: qty 2

## 2024-04-08 MED ORDER — SODIUM CHLORIDE 0.9% FLUSH: 10.0000 mL | INTRAVENOUS | Status: DC | PRN

## 2024-04-08 MED ORDER — MELATONIN 3 MG PO TABS
3.0000 mg | ORAL_TABLET | Freq: Once | ORAL | Status: AC
  Administered 2024-04-13: 3 mg via ORAL
  Filled 2024-04-08: qty 1

## 2024-04-08 MED ORDER — QUETIAPINE FUMARATE 25 MG PO TABS
25.0000 mg | ORAL_TABLET | Freq: Every evening | ORAL | Status: DC | PRN
  Administered 2024-04-08: 25 mg via ORAL
  Filled 2024-04-08: qty 1

## 2024-04-08 MED ORDER — DEXTROSE 50 % IV SOLN
25.0000 mL | Freq: Once | INTRAVENOUS | Status: AC
  Administered 2024-04-08: 25 mL via INTRAVENOUS
  Filled 2024-04-08: qty 50

## 2024-04-08 MED ORDER — CHLORHEXIDINE GLUCONATE CLOTH 2 % EX PADS
6.0000 | MEDICATED_PAD | Freq: Every day | CUTANEOUS | Status: DC
  Administered 2024-04-08 – 2024-04-09 (×2): 6 via TOPICAL

## 2024-04-08 MED ORDER — ARIPIPRAZOLE 5 MG PO TABS
5.0000 mg | ORAL_TABLET | Freq: Every day | ORAL | Status: DC
  Administered 2024-04-08 – 2024-04-15 (×8): 5 mg via ORAL
  Filled 2024-04-08 (×8): qty 1

## 2024-04-08 NOTE — Progress Notes (Signed)
 Ronald Reeves is an 68 y.o. male with ESRD on HD TTS, HTN, PAFib on Eliquis , HFrEF 20%, polysubstance abuse who presented to the ED last night after a fall at home. No acute findings on imaging. Labs this am notable for hyperkalemia, K 6.6.  Calcium  gluconate, insulin  have been ordered in the ED. Nephrology consulted for dialysis.    Dialysis via [facility]. Last dialysis was on Tuesday for 2 hr 30 mins. Frequently cuts dialysis treatments short. Prior 3 treatments have been less than 2 hours.    Seen and examined in the ED. He is alone. He is disoriented and not able to provide much history. Falls asleep during questioning.    Dialysis Orders:  [facility] TTS 4:00 BFR 400 EDW 62 kg 2K/2Ca TDC - Heparin  4000 + 2000 midrun  - calcitriol  2.25 q HD, sensipar 30 q HD   Assessment/Plan: Hyperkalemia resolved. Rx temporizing meds. Urgent dialysis on 8/28.  ESRD.  HD TTS. Not adherent to full Rx as outpatient. HD today as above.    - Next tx on Saturday.   Many interactions with them over the past couple years; often had very aggressive speech towards the spouse.  He has been missing treatments or signing off very early, continuing to use cocaine   Access. TDC in use  Hypertension. BP acceptable  Volume. UF to EDW as able  Anemia. Hgb above goal  Metabolic bone disease.  Ca acceptable. Follow if admitted.  HFrEF 20%. Optimize volume with HD as able  Subjective: Wife at bedside updated; agitated and mildly increased respiratory rate   Chemistry and CBC: Creat  Date/Time Value Ref Range Status  09/01/2013 04:15 PM 1.08 0.50 - 1.35 mg/dL Final  89/79/7985 93:99 PM 1.23 0.50 - 1.35 mg/dL Final   Creatinine, Ser  Date/Time Value Ref Range Status  04/08/2024 01:27 AM 4.62 (H) 0.61 - 1.24 mg/dL Final  91/71/7974 91:84 PM 4.07 (H) 0.61 - 1.24 mg/dL Final  91/71/7974 90:79 AM 7.02 (H) 0.61 - 1.24 mg/dL Final  91/72/7974 90:42 PM 6.70 (H) 0.61 - 1.24 mg/dL Final  91/77/7974 87:84 PM 6.33 (H) 0.61 - 1.24 mg/dL  Final  91/92/7974 95:61 AM 7.58 (H) 0.61 - 1.24 mg/dL Final  93/84/7974 91:99 AM 6.34 (H) 0.61 - 1.24 mg/dL Final  93/88/7974 92:72 PM 7.12 (H) 0.61 - 1.24 mg/dL Final  93/96/7974 96:65 PM 8.27 (H) 0.61 - 1.24 mg/dL Final  94/97/7974 96:56 AM 10.11 (H) 0.61 - 1.24 mg/dL Final  95/69/7974 97:82 AM 11.60 (H) 0.61 - 1.24 mg/dL Final  95/69/7974 97:85 AM 11.37 (H) 0.61 - 1.24 mg/dL Final  95/71/7974 96:61 PM 10.39 (H) 0.61 - 1.24 mg/dL Final  95/71/7974 93:47 AM 10.50 (H) 0.61 - 1.24 mg/dL Final  95/71/7974 93:52 AM 9.93 (H) 0.61 - 1.24 mg/dL Final  95/90/7974 95:79 AM 9.46 (H) 0.61 - 1.24 mg/dL Final  95/91/7974 95:71 AM 8.45 (H) 0.61 - 1.24 mg/dL Final  95/92/7974 95:75 AM 7.16 (H) 0.61 - 1.24 mg/dL Final  95/93/7974 88:69 AM 10.20 (H) 0.61 - 1.24 mg/dL Final  95/93/7974 96:81 AM 10.03 (H) 0.61 - 1.24 mg/dL Final  96/89/7974 92:72 AM 10.10 (H) 0.61 - 1.24 mg/dL Final  98/75/7974 96:60 AM 10.64 (H) 0.61 - 1.24 mg/dL Final  98/80/7974 94:96 PM 10.60 (H) 0.61 - 1.24 mg/dL Final  98/82/7974 94:46 AM 6.40 (H) 0.61 - 1.24 mg/dL Final  87/93/7975 90:52 AM 9.10 (H) 0.61 - 1.24 mg/dL Final  88/70/7975 92:89 AM 9.30 (H) 0.61 - 1.24 mg/dL Final  88/72/7975  03:58 AM 9.50 (H) 0.61 - 1.24 mg/dL Final  88/72/7975 96:82 AM 9.39 (H) 0.61 - 1.24 mg/dL Final  88/93/7975 91:64 AM 10.10 (H) 0.61 - 1.24 mg/dL Final  89/69/7975 98:55 PM 5.88 (H) 0.61 - 1.24 mg/dL Final  89/89/7975 89:55 AM 11.37 (H) 0.61 - 1.24 mg/dL Final  91/91/7975 87:75 AM 9.30 (H) 0.61 - 1.24 mg/dL Final  91/92/7975 88:99 PM 9.40 (H) 0.61 - 1.24 mg/dL Final  91/92/7975 89:44 PM 8.14 (H) 0.61 - 1.24 mg/dL Final  91/96/7975 94:61 AM 8.34 (H) 0.61 - 1.24 mg/dL Final  91/97/7975 90:58 PM 8.80 (H) 0.61 - 1.24 mg/dL Final  91/97/7975 91:46 PM 7.83 (H) 0.61 - 1.24 mg/dL Final  92/74/7975 92:58 AM 6.63 (H) 0.61 - 1.24 mg/dL Final  92/89/7975 89:99 AM 7.80 (H) 0.61 - 1.24 mg/dL Final  94/88/7975 96:98 AM 7.31 (H) 0.61 - 1.24 mg/dL Final   94/89/7975 95:92 AM 4.99 (H) 0.61 - 1.24 mg/dL Final  94/90/7975 97:49 PM 6.68 (H) 0.61 - 1.24 mg/dL Final  94/90/7975 89:58 AM 6.31 (H) 0.61 - 1.24 mg/dL Final  94/91/7975 94:46 AM 6.42 (H) 0.61 - 1.24 mg/dL Final  94/92/7975 88:62 PM 5.38 (H) 0.61 - 1.24 mg/dL Final  95/71/7975 98:54 AM 5.44 (H) 0.61 - 1.24 mg/dL Final  95/72/7975 91:40 PM 5.15 (H) 0.61 - 1.24 mg/dL Final  95/80/7975 91:94 AM 6.80 (H) 0.61 - 1.24 mg/dL Final  97/88/7975 95:85 AM 5.59 (H) 0.61 - 1.24 mg/dL Final  98/89/7975 91:74 AM 9.10 (H) 0.61 - 1.24 mg/dL Final  87/73/7976 98:90 PM 7.46 (H) 0.61 - 1.24 mg/dL Final  87/75/7976 87:69 AM 6.54 (H) 0.61 - 1.24 mg/dL Final   Recent Labs  Lab 04/01/24 1215 04/06/24 2157 04/07/24 0920 04/07/24 2015 04/08/24 0127  NA 139 133* 136 133* 134*  K 5.4* 5.9* 6.6* 4.9 4.8  CL 96* 95* 93* 94* 95*  CO2 25 22 21* 21* 22  GLUCOSE 98 108* 91 52* 81  BUN 73* 77* 84* 39* 44*  CREATININE 6.33* 6.70* 7.02* 4.07* 4.62*  CALCIUM  8.6* 8.9 9.4 9.1 8.9  PHOS  --   --   --  4.9* 6.0*   Recent Labs  Lab 04/01/24 1215 04/06/24 2157 04/07/24 0920 04/08/24 0127  WBC 4.1 5.3 3.8* 4.2  NEUTROABS  --   --   --  3.6  HGB 15.2 13.3 14.5 13.8  HCT 46.0 40.1 43.9 40.3  MCV 81.3 80.4 81.3 78.3*  PLT 99* 107* 112* 135*   Liver Function Tests: Recent Labs  Lab 04/01/24 1215 04/07/24 1410 04/07/24 2015 04/08/24 0127  AST 29 25  --   --   ALT 23 25  --   --   ALKPHOS 89 113  --   --   BILITOT 0.9 0.7  --   --   PROT 6.6 6.5  --   --   ALBUMIN  3.0* 2.8* 2.6* 2.4*   Recent Labs  Lab 04/01/24 1215  LIPASE 36   No results for input(s): AMMONIA in the last 168 hours. Cardiac Enzymes: No results for input(s): CKTOTAL, CKMB, CKMBINDEX, TROPONINI in the last 168 hours. Iron Studies: No results for input(s): IRON, TIBC, TRANSFERRIN, FERRITIN in the last 72 hours. PT/INR: @LABRCNTIP (inr:5)  Xrays/Other Studies: ) Results for orders placed or performed during  the hospital encounter of 04/07/24 (from the past 48 hours)  CBC     Status: Abnormal   Collection Time: 04/07/24  9:20 AM  Result Value Ref Range   WBC  3.8 (L) 4.0 - 10.5 K/uL   RBC 5.40 4.22 - 5.81 MIL/uL   Hemoglobin 14.5 13.0 - 17.0 g/dL   HCT 56.0 60.9 - 47.9 %   MCV 81.3 80.0 - 100.0 fL   MCH 26.9 26.0 - 34.0 pg   MCHC 33.0 30.0 - 36.0 g/dL   RDW 81.5 (H) 88.4 - 84.4 %   Platelets 112 (L) 150 - 400 K/uL   nRBC 0.0 0.0 - 0.2 %    Comment: Performed at [facility], [address], [place], [place] [address]  Basic metabolic panel     Status: Abnormal   Collection Time: 04/07/24  9:20 AM  Result Value Ref Range   Sodium 136 135 - 145 mmol/L   Potassium 6.6 (HH) 3.5 - 5.1 mmol/L    Comment: 1st attempt to call @1015  CRITICAL RESULT CALLED TO, READ BACK BY AND VERIFIED WITH BERTRAM,S CHARGE RN 1041 04/07/24 AMIREHSANIF    Chloride 93 (L) 98 - 111 mmol/L   CO2 21 (L) 22 - 32 mmol/L   Glucose, Bld 91 70 - 99 mg/dL    Comment: Glucose reference range applies only to samples taken after fasting for at least 8 hours.   BUN 84 (H) 8 - 23 mg/dL   Creatinine, Ser 2.97 (H) 0.61 - 1.24 mg/dL   Calcium  9.4 8.9 - 10.3 mg/dL   GFR, Estimated 8 (L) >60 mL/min    Comment: (NOTE) Calculated using the CKD-EPI Creatinine Equation (2021)    Anion gap 22 (H) 5 - 15    Comment: ELECTROLYTES REPEATED TO VERIFY Performed at [facility], [address] [place], [place] [address]   CBG monitoring, ED     Status: None   Collection Time: 04/07/24 11:40 AM  Result Value Ref Range   Glucose-Capillary 90 70 - 99 mg/dL    Comment: Glucose reference range applies only to samples taken after fasting for at least 8 hours.   Comment 1 Document in Chart   CBG monitoring, ED     Status: Abnormal   Collection Time: 04/07/24 12:36 PM  Result Value Ref Range   Glucose-Capillary 141 (H) 70 - 99 mg/dL    Comment: Glucose reference range applies only to samples taken after fasting for at least 8  hours.   Comment 1 Document in Chart   Hepatitis B surface antigen     Status: None   Collection Time: 04/07/24  2:10 PM  Result Value Ref Range   Hepatitis B Surface Ag NON REACTIVE NON REACTIVE    Comment: Performed at [facility], [address] [place], [place] [address]  Hepatic function panel     Status: Abnormal   Collection Time: 04/07/24  2:10 PM  Result Value Ref Range   Total Protein 6.5 6.5 - 8.1 g/dL   Albumin  2.8 (L) 3.5 - 5.0 g/dL   AST 25 15 - 41 U/L   ALT 25 0 - 44 U/L   Alkaline Phosphatase 113 38 - 126 U/L   Total Bilirubin 0.7 0.0 - 1.2 mg/dL   Bilirubin, Direct 0.2 0.0 - 0.2 mg/dL   Indirect Bilirubin 0.5 0.3 - 0.9 mg/dL    Comment: Performed at [facility], [address] [place], [place] [address]  Renal function panel     Status: Abnormal   Collection Time: 04/07/24  8:15 PM  Result Value Ref Range   Sodium 133 (L) 135 - 145 mmol/L   Potassium  4.9 3.5 - 5.1 mmol/L    Comment: HEMOLYSIS AT THIS LEVEL MAY AFFECT RESULT DELTA CHECK NOTED    Chloride 94 (L) 98 - 111 mmol/L   CO2 21 (L) 22 - 32 mmol/L   Glucose, Bld 52 (L) 70 - 99 mg/dL    Comment: Glucose reference range applies only to samples taken after fasting for at least 8 hours.   BUN 39 (H) 8 - 23 mg/dL   Creatinine, Ser 5.92 (H) 0.61 - 1.24 mg/dL   Calcium  9.1 8.9 - 10.3 mg/dL   Phosphorus 4.9 (H) 2.5 - 4.6 mg/dL   Albumin  2.6 (L) 3.5 - 5.0 g/dL   GFR, Estimated 15 (L) >60 mL/min    Comment: (NOTE) Calculated using the CKD-EPI Creatinine Equation (2021)    Anion gap 18 (H) 5 - 15    Comment: Performed at [facility], [address], [place], [place] [address]  Glucose, capillary     Status: Abnormal   Collection Time: 04/07/24  9:40 PM  Result Value Ref Range   Glucose-Capillary 44 (LL) 70 - 99 mg/dL    Comment: Glucose reference range applies only to samples taken after fasting for at least 8 hours.   Comment 1 Notify RN   Glucose, capillary     Status: Abnormal    Collection Time: 04/07/24 10:17 PM  Result Value Ref Range   Glucose-Capillary 48 (L) 70 - 99 mg/dL    Comment: Glucose reference range applies only to samples taken after fasting for at least 8 hours.  Glucose, capillary     Status: None   Collection Time: 04/07/24 11:30 PM  Result Value Ref Range   Glucose-Capillary 93 70 - 99 mg/dL    Comment: Glucose reference range applies only to samples taken after fasting for at least 8 hours.  Glucose, capillary     Status: None   Collection Time: 04/08/24 12:59 AM  Result Value Ref Range   Glucose-Capillary 91 70 - 99 mg/dL    Comment: Glucose reference range applies only to samples taken after fasting for at least 8 hours.  Renal function panel     Status: Abnormal   Collection Time: 04/08/24  1:27 AM  Result Value Ref Range   Sodium 134 (L) 135 - 145 mmol/L   Potassium 4.8 3.5 - 5.1 mmol/L   Chloride 95 (L) 98 - 111 mmol/L   CO2 22 22 - 32 mmol/L   Glucose, Bld 81 70 - 99 mg/dL    Comment: Glucose reference range applies only to samples taken after fasting for at least 8 hours.   BUN 44 (H) 8 - 23 mg/dL   Creatinine, Ser 5.37 (H) 0.61 - 1.24 mg/dL   Calcium  8.9 8.9 - 10.3 mg/dL   Phosphorus 6.0 (H) 2.5 - 4.6 mg/dL   Albumin  2.4 (L) 3.5 - 5.0 g/dL   GFR, Estimated 13 (L) >60 mL/min    Comment: (NOTE) Calculated using the CKD-EPI Creatinine Equation (2021)    Anion gap 17 (H) 5 - 15    Comment: Performed at [facility], [address] [place], [place] [address]  CBC with Differential/Platelet     Status: Abnormal   Collection Time: 04/08/24  1:27 AM  Result Value Ref Range   WBC 4.2 4.0 - 10.5 K/uL   RBC 5.15 4.22 - 5.81 MIL/uL   Hemoglobin 13.8 13.0 - 17.0 g/dL   HCT 59.6 60.9 - 47.9 %   MCV 78.3 (L) 80.0 -  100.0 fL   MCH 26.8 26.0 - 34.0 pg   MCHC 34.2 30.0 - 36.0 g/dL   RDW 82.7 (H) 88.4 - 84.4 %   Platelets 135 (L) 150 - 400 K/uL    Comment: REPEATED TO VERIFY   nRBC 0.0 0.0 - 0.2 %   Neutrophils Relative % 86 %    Neutro Abs 3.6 1.7 - 7.7 K/uL   Lymphocytes Relative 4 %   Lymphs Abs 0.2 (L) 0.7 - 4.0 K/uL   Monocytes Relative 9 %   Monocytes Absolute 0.4 0.1 - 1.0 K/uL   Eosinophils Relative 0 %   Eosinophils Absolute 0.0 0.0 - 0.5 K/uL   Basophils Relative 0 %   Basophils Absolute 0.0 0.0 - 0.1 K/uL   WBC Morphology See Note     Comment: INCREASED BANDS (>20% BANDS)   Smear Review Normal platelet morphology    Immature Granulocytes 1 %   Abs Immature Granulocytes 0.02 0.00 - 0.07 K/uL   Ovalocytes PRESENT     Comment: Performed at [facility], [address] [place], [place] [address]  Glucose, capillary     Status: None   Collection Time: 04/08/24  4:03 AM  Result Value Ref Range   Glucose-Capillary 72 70 - 99 mg/dL    Comment: Glucose reference range applies only to samples taken after fasting for at least 8 hours.  Glucose, capillary     Status: None   Collection Time: 04/08/24  8:13 AM  Result Value Ref Range   Glucose-Capillary 73 70 - 99 mg/dL    Comment: Glucose reference range applies only to samples taken after fasting for at least 8 hours.   Comment 1 Notify RN    DG Chest Portable 1 View Result Date: 04/07/2024 EXAM: 1 VIEW XRAY OF THE CHEST 04/07/2024 11:04:35 AM COMPARISON: 04/06/2024 CLINICAL HISTORY: SOB, dialysis. Per chart - Pt here for a unwitnessed fall. Pt unsure if he hit his head. Denies LOC. No lacerations or abrasions noted to head. Pt is on eliquis  and plavix. Axox4. Pts last full dialysis treatment was yesterday per pt. Pt states he did cocaine  last night. FINDINGS: LUNGS AND PLEURA: Left basilar airspace opacities are improving. No pleural effusion. No pneumothorax. HEART AND MEDIASTINUM: The heart is enlarged. Atherosclerotic changes are again noted at the aortic arch. BONES AND SOFT TISSUES: No acute osseous abnormality. LINES AND TUBES: A right IJ dialysis catheter is stable and in appropriate position. IMPRESSION: 1. Improving left basilar airspace  opacities. 2. Enlarged heart. 3. Atherosclerotic changes at the aortic arch. Electronically signed by: [name] Necessary MD 04/07/2024 11:13 AM EDT RP Workstation: [id number]   CT Head Wo Contrast Result Date: 04/07/2024 CLINICAL DATA:  Head trauma, minor (Age >= 65y) EXAM: CT HEAD WITHOUT CONTRAST TECHNIQUE: Contiguous axial images were obtained from the base of the skull through the vertex without intravenous contrast. RADIATION DOSE REDUCTION: This exam was performed according to the departmental dose-optimization program which includes automated exposure control, adjustment of the mA and/or kV according to patient size and/or use of iterative reconstruction technique. COMPARISON:  None Available. FINDINGS: Brain: No evidence of acute infarction, hemorrhage, hydrocephalus, extra-axial collection or mass lesion/mass effect. Patchy white matter hypodensities, compatible with chronic microvascular ischemic disease. Vascular: Calcific atherosclerosis. Skull: No acute fracture. Sinuses/Orbits: Mostly clear sinuses.  No orbital fracture. IMPRESSION: No evidence of acute intracranial abnormality. Electronically Signed   By: Gilmore GORMAN Molt M.D.   On: 04/07/2024 09:53   CT Head Wo Contrast Result  Date: 04/06/2024 CLINICAL DATA:  Head trauma, minor (Age >= 65y); Neck trauma (Age >= 65y) Fall. EXAM: CT HEAD WITHOUT CONTRAST CT CERVICAL SPINE WITHOUT CONTRAST TECHNIQUE: Multidetector CT imaging of the head and cervical spine was performed following the standard protocol without intravenous contrast. Multiplanar CT image reconstructions of the cervical spine were also generated. RADIATION DOSE REDUCTION: This exam was performed according to the departmental dose-optimization program which includes automated exposure control, adjustment of the mA and/or kV according to patient size and/or use of iterative reconstruction technique. COMPARISON:  None Available. FINDINGS: CT HEAD FINDINGS Brain: Patchy and confluent  areas of decreased attenuation are noted throughout the deep and periventricular white matter of the cerebral hemispheres bilaterally, compatible with chronic microvascular ischemic disease. No evidence of large-territorial acute infarction. No parenchymal hemorrhage. No mass lesion. No extra-axial collection. No mass effect or midline shift. No hydrocephalus. Basilar cisterns are patent. Vascular: No hyperdense vessel. Atherosclerotic calcifications are present within the cavernous internal carotid arteries. Skull: No acute fracture or focal lesion. Chronic right nasal bone fracture. Sinuses/Orbits: Paranasal sinuses and mastoid air cells are clear. The orbits are unremarkable. Other: None. CT CERVICAL SPINE FINDINGS Alignment: Normal. Skull base and vertebrae: Limited evaluation due to motion artifact. Multilevel severe degenerative change of the spine. Moderate to severe C3-C4, C4-C5, C5-C6, and C7-T1 osseous neural foraminal stenosis. No acute fracture. No aggressive appearing focal osseous lesion or focal pathologic process. Soft tissues and spinal canal: No prevertebral fluid or swelling. No visible canal hematoma. Upper chest: Paraseptal and centrilobular emphysematous changes. Other: None. IMPRESSION: 1. No acute intracranial abnormality. 2. No acute displaced fracture or traumatic listhesis of the cervical spine. Limited evaluation due to motion artifact. Electronically Signed   By: Morgane  Naveau M.D.   On: 04/06/2024 22:44   CT Cervical Spine Wo Contrast Result Date: 04/06/2024 CLINICAL DATA:  Head trauma, minor (Age >= 65y); Neck trauma (Age >= 65y) Fall. EXAM: CT HEAD WITHOUT CONTRAST CT CERVICAL SPINE WITHOUT CONTRAST TECHNIQUE: Multidetector CT imaging of the head and cervical spine was performed following the standard protocol without intravenous contrast. Multiplanar CT image reconstructions of the cervical spine were also generated. RADIATION DOSE REDUCTION: This exam was performed according to  the departmental dose-optimization program which includes automated exposure control, adjustment of the mA and/or kV according to patient size and/or use of iterative reconstruction technique. COMPARISON:  None Available. FINDINGS: CT HEAD FINDINGS Brain: Patchy and confluent areas of decreased attenuation are noted throughout the deep and periventricular white matter of the cerebral hemispheres bilaterally, compatible with chronic microvascular ischemic disease. No evidence of large-territorial acute infarction. No parenchymal hemorrhage. No mass lesion. No extra-axial collection. No mass effect or midline shift. No hydrocephalus. Basilar cisterns are patent. Vascular: No hyperdense vessel. Atherosclerotic calcifications are present within the cavernous internal carotid arteries. Skull: No acute fracture or focal lesion. Chronic right nasal bone fracture. Sinuses/Orbits: Paranasal sinuses and mastoid air cells are clear. The orbits are unremarkable. Other: None. CT CERVICAL SPINE FINDINGS Alignment: Normal. Skull base and vertebrae: Limited evaluation due to motion artifact. Multilevel severe degenerative change of the spine. Moderate to severe C3-C4, C4-C5, C5-C6, and C7-T1 osseous neural foraminal stenosis. No acute fracture. No aggressive appearing focal osseous lesion or focal pathologic process. Soft tissues and spinal canal: No prevertebral fluid or swelling. No visible canal hematoma. Upper chest: Paraseptal and centrilobular emphysematous changes. Other: None. IMPRESSION: 1. No acute intracranial abnormality. 2. No acute displaced fracture or traumatic listhesis of the cervical spine. Limited evaluation  due to motion artifact. Electronically Signed   By: Morgane  Naveau M.D.   On: 04/06/2024 22:44   DG Shoulder Right Result Date: 04/06/2024 CLINICAL DATA:  Fall EXAM: RIGHT SHOULDER - 2+ VIEW COMPARISON:  None FINDINGS: Right-sided central venous catheter with tip at the right atrium. Advanced  degenerative changes at the [facility] joint. Limited Y-view to assess for dislocation. No definitive fracture. Mild to moderate glenohumeral degenerative change. High-riding humeral head consistent with rotator cuff disease. IMPRESSION: 1. Limited Y-view to assess for dislocation. No definitive fracture. 2. Degenerative changes. High-riding humeral head consistent with rotator cuff disease. Electronically Signed   By: Luke Bun M.D.   On: 04/06/2024 20:58   DG Chest Portable 1 View Result Date: 04/06/2024 CLINICAL DATA:  Fall. EXAM: PORTABLE CHEST 1 VIEW COMPARISON:  Chest radiograph dated 04/01/2024. FINDINGS: Dialysis catheter in similar position. There is cardiomegaly with mild central vascular congestion. No focal consolidation, pleural effusion, or pneumothorax. Atherosclerotic calcification of the aorta. Degenerative changes of the spine. No acute osseous pathology. IMPRESSION: Cardiomegaly with mild central vascular congestion. No focal consolidation. Electronically Signed   By: Vanetta Chou M.D.   On: 04/06/2024 20:29    PMH:   Past Medical History:  Diagnosis Date   Anemia    Anxiety    Arthritis    Back pain    Bronchitis    COPD (chronic obstructive pulmonary disease) (HCC)    Coronary artery disease    COVID    mild - flu like symptoms   Dyspnea    w/ exertion, uses inhaler   ESRD on hemodialysis (HCC)    dialysis on tues, thurs, sat at [facility]   GERD (gastroesophageal reflux disease)    not a current problem   Heart murmur    never has caused any problems   Hypertension    Myocardial infarction (HCC)    Pneumonia    x 1   Pre-diabetes    diet controlled, no meds, does not check blood sugar   Substance abuse (HCC)     PSH:   Past Surgical History:  Procedure Laterality Date   A/V FISTULAGRAM Right 06/17/2023   Procedure: A/V Fistulagram;  Surgeon: Lanis Fonda BRAVO, MD;  Location: [facility] INVASIVE CV LAB;  Service: Cardiovascular;  Laterality: Right;   A/V FISTULAGRAM Right  07/17/2023   Procedure: A/V Fistulagram;  Surgeon: Magda Debby SAILOR, MD;  Location: [facility] INVASIVE CV LAB;  Service: Cardiovascular;  Laterality: Right;   A/V SHUNT INTERVENTION N/A 12/10/2023   Procedure: A/V SHUNT INTERVENTION;  Surgeon: Tobie Gordy POUR, MD;  Location: [facility] INVASIVE CV LAB;  Service: Cardiovascular;  Laterality: N/A;   AV FISTULA PLACEMENT Left 04/12/2021   Procedure: LEFT ARM ARTERIOVENOUS (AV) FISTULA CREATION;  Surgeon: Gretta [name] PARAS, MD;  Location: [facility] OR;  Service: Vascular;  Laterality: Left;   AV FISTULA PLACEMENT Left 10/18/2021   Procedure: LEFT ARM BRACHIOBASILIC FISTULA CREATION FIRST STAGE;  Surgeon: Gretta [name] PARAS, MD;  Location: [facility] OR;  Service: Vascular;  Laterality: Left;   AV FISTULA PLACEMENT Right 11/28/2022   Procedure: RIGHT ARM FIRST STAGE BRACHIOBASILIC FISTULA CREATION;  Surgeon: Gretta [name] PARAS, MD;  Location: [facility] OR;  Service: Vascular;  Laterality: Right;   AV FISTULA PLACEMENT Right 10/19/2023   Procedure: RIGHT ARM ARTERIOVENOUS GRAFT CREATION;  Surgeon: Magda Debby SAILOR, MD;  Location: [facility] OR;  Service: Vascular;  Laterality: Right;   BACK SURGERY     BASCILIC VEIN TRANSPOSITION Left 01/27/2022   Procedure: LEFT SECOND  STAGE BASILIC VEIN TRANSPOSITION;  Surgeon: Gretta [name] PARAS, MD;  Location: [facility] OR;  Service: Vascular;  Laterality: Left;   BASCILIC VEIN TRANSPOSITION Right 02/18/2023   Procedure: SECOND STAGE RIGHT ARM BASILIC VEIN TRANSPOSITION;  Surgeon: Gretta [name] PARAS, MD;  Location: [facility] OR;  Service: Vascular;  Laterality: Right;   BIOPSY  08/20/2022   Procedure: BIOPSY;  Surgeon: San Sandor GAILS, DO;  Location: [facility] ENDOSCOPY;  Service: Gastroenterology;;   COLONOSCOPY WITH PROPOFOL  N/A 08/20/2022   Procedure: COLONOSCOPY WITH PROPOFOL ;  Surgeon: San Sandor GAILS, DO;  Location: [facility] ENDOSCOPY;  Service: Gastroenterology;  Laterality: N/A;   DIALYSIS/PERMA CATHETER INSERTION Right 03/05/2023   Procedure: DIALYSIS/PERMA CATHETER  INSERTION;  Surgeon: Gretta [name] PARAS, MD;  Location: [facility] INVASIVE CV LAB;  Service: Cardiovascular;  Laterality: Right;   DIALYSIS/PERMA CATHETER INSERTION  12/10/2023   Procedure: DIALYSIS/PERMA CATHETER INSERTION;  Surgeon: Tobie Gordy POUR, MD;  Location: [facility] INVASIVE CV LAB;  Service: Cardiovascular;;   DIALYSIS/PERMA CATHETER INSERTION N/A 01/21/2024   Procedure: DIALYSIS/PERMA CATHETER INSERTION;  Surgeon: Serene Gaile ORN, MD;  Location: [facility] LAB;  Service: Cardiovascular;  Laterality: N/A;   ESOPHAGOGASTRODUODENOSCOPY (EGD) WITH PROPOFOL  N/A 08/20/2022   Procedure: ESOPHAGOGASTRODUODENOSCOPY (EGD) WITH PROPOFOL ;  Surgeon: San Sandor GAILS, DO;  Location: [facility];  Service: Gastroenterology;  Laterality: N/A;   INSERTION OF DIALYSIS CATHETER Right 05/01/2022   Procedure: INSERTION OF DIALYSIS CATHETER;  Surgeon: Magda Debby SAILOR, MD;  Location: [facility];  Service: Vascular;  Laterality: Right;   INSERTION OF DIALYSIS CATHETER Right 07/10/2023   Procedure: ULTRASOUNDED GUIDED INSERTION OF TUNNELED  DIALYSIS CATHETER TO RIGHT FEMORAL VEIN;  Surgeon: Gretta [name] PARAS, MD;  Location: [facility] OR;  Service: Vascular;  Laterality: Right;   IR FLUORO GUIDE CV LINE RIGHT  12/07/2023   IR FLUORO GUIDE CV LINE RIGHT  12/09/2023   IR REMOVAL TUN CV CATH W/O FL  12/19/2022   IR REMOVAL TUN CV CATH W/O FL  11/18/2023   IR US  GUIDE VASC ACCESS RIGHT  12/07/2023   LIGATION OF COMPETING BRANCHES OF ARTERIOVENOUS FISTULA Left 07/15/2021   Procedure: LIGATION OF COMPETING BRANCHES OF LEFT RADIOCEPHALIC ARTERIOVENOUS FISTULA TIMES TWO;  Surgeon: Gretta [name] PARAS, MD;  Location: [facility];  Service: Vascular;  Laterality: Left;   PERIPHERAL VASCULAR BALLOON ANGIOPLASTY  07/17/2023   Procedure: PERIPHERAL VASCULAR BALLOON ANGIOPLASTY;  Surgeon: Magda Debby SAILOR, MD;  Location: [facility];  Service: Cardiovascular;;   PERIPHERAL VASCULAR INTERVENTION  06/17/2023   Procedure: PERIPHERAL VASCULAR INTERVENTION;   Surgeon: Lanis Fonda BRAVO, MD;  Location: [facility] INVASIVE CV LAB;  Service: Cardiovascular;;   POLYPECTOMY  08/20/2022   Procedure: POLYPECTOMY;  Surgeon: San Sandor GAILS, DO;  Location: [facility];  Service: Gastroenterology;;   REVISON OF ARTERIOVENOUS FISTULA Left 07/15/2021   Procedure: REVISON OF LEFT ARTERIOVENOUS FISTULA;  Surgeon: Gretta [name] PARAS, MD;  Location[facility] OR;  Service: Vascular;  Laterality: Left;   REVISON OF ARTERIOVENOUS FISTULA Left 05/01/2022   Procedure: ARTERIOVENOUS FISTULA WASHOUT OF ARM HEMATOMA;  Surgeon: Magda Debby SAILOR, MD;  Location: [facility] OR;  Service: Vascular;  Laterality: Left;   TUNNELLED CATHETER EXCHANGE N/A 10/23/2023   Procedure: TUNNELLED CATHETER EXCHANGE;  Surgeon: Norine Manuelita LABOR, MD;  Location: [facility];  Service: Cardiovascular;  Laterality: N/A;   UPPER EXTREMITY VENOGRAPHY Bilateral 08/28/2023   Procedure: UPPER EXTREMITY VENOGRAPHY;  Surgeon: Magda Debby SAILOR, MD;  Location: [facility];  Service: Cardiovascular;  Laterality: Bilateral;    Allergies:  Allergies  Allergen Reactions   Enalapril  Other (See Comments)    Renal failure syndrome   Gabapentin      Encephalopathy, tremor   Iodinated Contrast Media Nausea And Vomiting    Medications:   Prior to Admission medications   Medication Sig Start Date End Date Taking? Authorizing Provider  amLODipine  (NORVASC ) 10 MG tablet Take 1 tablet (10 mg total) by mouth daily. Patient taking differently: Take 10 mg by mouth daily. Hold for systolic BP <100 4/75/80  Yes Crenshaw, Redell RAMAN, MD  apixaban  (ELIQUIS ) 5 MG TABS tablet Take 1 tablet (5 mg total) by mouth 2 (two) times daily. 03/06/23  Yes Atway, Rayann N, DO  ARIPiprazole  (ABILIFY ) 5 MG tablet Take 1 tablet (5 mg total) by mouth daily. 11/20/23  Yes Ghimire, Donalda HERO, MD  B Complex-C-Folic Acid  (DIALYVITE  TABLET) TABS Take 1 tablet by mouth daily. 12/23/22  Yes [provider]  carvedilol  (COREG ) 12.5 MG tablet Take  12.5 mg by mouth 2 (two) times daily with a meal.   Yes [provider]  escitalopram  (LEXAPRO ) 10 MG tablet Take 5-10 mg by mouth See admin instructions. Take 5 mg by mouth daily for 10 days then increase to 10 mg once daily.   Yes [provider]  ferric citrate  (AURYXIA) 1 GM 210 MG(Fe) tablet Take 210 mg by mouth 3 (three) times daily with meals.   Yes [provider]  furosemide  (LASIX ) 40 MG tablet Take 40 mg by mouth daily.   Yes [provider]  HYDROcodone -acetaminophen  (NORCO) 10-325 MG tablet SMARTSIG:1 Tablet(s) By Mouth Every 8-12 Hours PRN 02/18/24  Yes [provider]  isosorbide  mononitrate (IMDUR ) 30 MG 24 hr tablet Take 30 mg by mouth daily.   Yes [provider]  mirtazapine  (REMERON ) 15 MG tablet Take 15 mg by mouth at bedtime.   Yes [provider]  mometasone [facility]) 220 MCG/ACT inhaler Inhale 2 puffs into the lungs daily.   Yes [provider]  PSYLLIUM PO Take 5 mLs by mouth daily. 09/03/23  Yes [provider]  rosuvastatin  (CRESTOR ) 10 MG tablet Take 10 mg by mouth at bedtime.   Yes [provider]  Tiotropium Bromide-Olodaterol 2.5-2.5 MCG/ACT AERS Take 2 puffs by mouth in the morning. 08/07/23  Yes [provider]  albuterol  (VENTOLIN  HFA) 108 (90 Base) MCG/ACT inhaler Inhale 2 puffs into the lungs every 6 (six) hours as needed (COPD).    [provider]  escitalopram  (LEXAPRO ) 20 MG tablet Take 0.5 tablets (10 mg total) by mouth at bedtime. 11/19/23   Ghimire, Donalda HERO, MD  hydrALAZINE  (APRESOLINE ) 25 MG tablet Take 25 mg by mouth 2 (two) times daily.    [provider]  lidocaine  (LIDODERM ) 5 % Place 2 patches onto the skin daily. Remove & Discard patch within 12 hours or as directed by MD    [provider]  naloxone  (NARCAN ) nasal spray 4 mg/0.1 mL Place 0.4 mg into the nose See admin instructions.  SPRAY 1 SPRAY INTO ONE NOSTRIL AS DIRECTED AS  NEEDED FOR RESCUE FOR OPIOID OVERDOSE - CALL 911 IMMEDIATELY, ADMINISTER DOSE, THEN TURN PERSON ON SIDE - IF NO RESPONSE IN 2-3 MINUTES OR PERSON RESPONDS BUT RELAPSES, REPEAT USING A NEW SPRAY DEVICE AND SPRAY INTO THE OTHER NOSTRIL AS NEEDED FOR RESCUE 01/21/23   [provider]  omeprazole  (PRILOSEC) 40 MG capsule Take 1 capsule (40 mg total) by mouth daily. Patient taking differently: Take 40 mg by mouth daily  as needed. 06/06/22   Kennedy-Smith, Colleen M, NP  ondansetron  (ZOFRAN -ODT) 8 MG disintegrating tablet Take 8 mg by mouth every 6 (six) hours. 11/06/23   [provider]  oxyCODONE -acetaminophen  (PERCOCET/ROXICET) 5-325 MG tablet Take 1 tablet by mouth every 4 (four) hours as needed for severe pain (pain score 7-10). 04/06/24   Patsey Lot, MD  sevelamer  carbonate (RENVELA ) 800 MG tablet Take 1,600 mg by mouth 3 (three) times daily with meals. 01/06/23   [provider]  sorbitol 70 % solution Take 30 mLs by mouth 3 (three) times daily as needed (constipation). 01/20/23   [provider]  tamsulosin  (FLOMAX ) 0.4 MG CAPS capsule Take 0.4 mg by mouth at bedtime.    [provider]    Discontinued Meds:   Medications Discontinued During This Encounter  Medication Reason   albuterol  (VENTOLIN  HFA) 108 (90 Base) MCG/ACT inhaler 2 puff P&T Policy: Therapeutic Substitute   apixaban  (ELIQUIS ) tablet 5 mg    pentafluoroprop-tetrafluoroeth (GEBAUERS) aerosol 1 Application Patient Transfer   lidocaine  (PF) (XYLOCAINE ) 1 % injection 5 mL Patient Transfer   lidocaine -prilocaine  (EMLA ) cream 1 Application Patient Transfer   heparin  injection 1,000 Units Patient Transfer   anticoagulant sodium citrate  solution 5 mL Patient Transfer   alteplase  (CATHFLO ACTIVASE ) injection 2 mg Patient Transfer   heparin  injection 1,400 Units Patient Transfer    Social History:  reports that he has been smoking cigars and cigarettes. He has been exposed to tobacco  smoke. He has never used smokeless tobacco. He reports that he does not currently use alcohol after a past usage of about 14.0 standard drinks of alcohol per week. He reports current drug use. Frequency: 1.00 time per week. Drugs: Marijuana and Cocaine .  Family History:   Family History  Problem Relation Age of Onset   Heart disease Mother    Heart attack Sister 23    Blood pressure (!) 111/91, pulse 81, temperature 98.2 F (36.8 C), temperature source Oral, resp. rate 16, height 5' 11 (1.803 m), weight 62.4 kg, SpO2 97%. Physical Exam: General: Appears more agitated today  Head: NCAT sclera not icteric MMM Neck: Supple. No JVD appreciated  Lungs: Clear bilaterally, no iwob Heart: RRR, no murmur,  Abdomen: soft non-tender  Lower extremities:  tr-1+ pitting LE edema bilat  Neuro: agitated Psych:  Unable to answer questions  Dialysis Access: R chest TDC      Sieara Bremer, LYNWOOD ORN, MD 04/08/2024, 8:44 AM

## 2024-04-08 NOTE — Progress Notes (Signed)
 PT Cancellation Note  Patient Details Name: Ronald Reeves MRN: [id number] DOB: 02-Nov-1955   Cancelled Treatment:    Reason Eval/Treat Not Completed: (P) Other (comment) Pt agitated this morning and needing restraints. RN request PT hold Evaluation. PT will follow back later today to determine appropriateness.   Randall Colden B. Fleeta Lapidus PT, DPT Acute Rehabilitation Services Please use secure chat or  Call Office [phone]    Almarie KATHEE Fleeta Fleet 04/08/2024, 8:12 AM

## 2024-04-08 NOTE — Hospital Course (Addendum)
 Principal Problem:   Acute metabolic encephalopathy Active Problems:   Essential hypertension   Alcohol use disorder, moderate, in sustained remission (HCC)   Cocaine  use disorder, severe, dependence (HCC)   ESRD (end stage renal disease) on dialysis TTS schedule (HCC)   HFrEF (heart failure with reduced ejection fraction) (HCC)   MDD (major depressive disorder), recurrent severe, without psychosis (HCC)   Paroxysmal atrial fibrillation (HCC)   Hypoglycemia   Thrombocytopenia (HCC)   Hyperkalemia   Hyponatremia  Resolved Problems:   * No resolved hospital problems. *  Consults:***  Procedures:***  Follow-up items: -ruq ultrasound -hospital bed, hoyer lift

## 2024-04-08 NOTE — Plan of Care (Signed)
   Problem: Education: Goal: Knowledge of General Education information will improve Description Including pain rating scale, medication(s)/side effects and non-pharmacologic comfort measures Outcome: Progressing

## 2024-04-08 NOTE — Progress Notes (Addendum)
 HD#0 SUBJECTIVE:  Patient Summary: Ronald Reeves is a 68 y.o. with a pertinent PMH of ESRD on HD TTS, HTN, paroxysmal a-fib on eliquis , HFrEF, prior CVA, and polysubstance use disorder, who presented with altered mental status and admitted for Toxic Metabolic Encephalopathy.   Overnight Events: pt agitated and increasingly confused  Interim History: Called to the bedside due to pt being increasingly confused and trying to pull at lines. Pt encephalopathic upon exam with wife at bedside.   OBJECTIVE:  Vital Signs: Vitals:   04/08/24 0810 04/08/24 0836 04/08/24 0839 04/08/24 1205  BP:    (!) 146/76  Pulse:    90  Resp:    16  Temp:    (!) 97.4 F (36.3 C)  TempSrc:      SpO2: 95% 94% 97% 95%  Weight:      Height:       Supplemental O2: Room Air SpO2: 95 %  Filed Weights   04/07/24 0817 04/08/24 0500  Weight: 72 kg 62.4 kg     Intake/Output Summary (Last 24 hours) at 04/08/2024 1422 Last data filed at 04/08/2024 1230 Gross per 24 hour  Intake 250 ml  Output 2000 ml  Net -1750 ml   Net IO Since Admission: -1,750 mL [04/08/24 1422]  Physical Exam: Physical Exam  Patient Lines/Drains/Airways Status     Active Line/Drains/Airways     Name Placement date Placement time Site Days   Peripheral IV 20 G Anterior;Left;Upper Arm --  --  Arm  --   Fistula / Graft Right Upper arm Arteriovenous fistula 11/16/23  0000  Upper arm  144   Hemodialysis Catheter Right Internal jugular Double lumen Permanent (Tunneled) 01/21/24  0853  Internal jugular  78            Pertinent labs and imaging:      Latest Ref Rng & Units 04/08/2024    1:27 AM 04/07/2024    9:20 AM 04/06/2024    9:57 PM  CBC  WBC 4.0 - 10.5 K/uL 4.2  3.8  5.3   Hemoglobin 13.0 - 17.0 g/dL 86.1  85.4  86.6   Hematocrit 39.0 - 52.0 % 40.3  43.9  40.1   Platelets 150 - 400 K/uL 135  112  107        Latest Ref Rng & Units 04/08/2024    1:27 AM 04/07/2024    8:15 PM 04/07/2024    2:10 PM  CMP  Glucose 70 - 99  mg/dL 81  52    BUN 8 - 23 mg/dL 44  39    Creatinine 9.38 - 1.24 mg/dL 5.37  5.92    Sodium 864 - 145 mmol/L 134  133    Potassium 3.5 - 5.1 mmol/L 4.8  4.9    Chloride 98 - 111 mmol/L 95  94    CO2 22 - 32 mmol/L 22  21    Calcium  8.9 - 10.3 mg/dL 8.9  9.1    Total Protein 6.5 - 8.1 g/dL   6.5   Total Bilirubin 0.0 - 1.2 mg/dL   0.7   Alkaline Phos 38 - 126 U/L   113   AST 15 - 41 U/L   25   ALT 0 - 44 U/L   25     No results found.  ASSESSMENT/PLAN:  Assessment: Principal Problem:   Acute metabolic encephalopathy Active Problems:   Essential hypertension   Alcohol use disorder, moderate, in sustained remission (HCC)   Cocaine  use  disorder, severe, dependence (HCC)   ESRD (end stage renal disease) on dialysis TTS schedule (HCC)   HFrEF (heart failure with reduced ejection fraction) (HCC)   MDD (major depressive disorder), recurrent severe, without psychosis (HCC)   Paroxysmal atrial fibrillation (HCC)   Hypoglycemia   Thrombocytopenia (HCC)   Hyperkalemia   Hyponatremia   Plan: #Toxic metabolic encephalopathy #Hx of Cocaine  Use -The patient presented with increased encephalopathy. Per his wife, this has never happened before, however he does have intermittent confusion -He does use cocaine  daily, with his last use Wednesday evening -No improvement of mental status after dialysis -Throughout the day, his mentation has begun to improve--he is now out of restraints and interacting with the team.  -Suspect this acute encephalopathy and increased somnolence is due to cocaine  withdrawal. -With documented history of cirrhosis from last hospital admission, will obtain RUQ U/S -Restart home abilify  5mg   -Seroquel  25mg  nightly PRN  #ESRD on HD TTS #Anion gap metabolic acidosis #electrolyte derangements -The patient presented with hyperkalemia and AGMA.  -Hisotriically nonadherent to dialysis schedule -Underwent dialysis yesterday with improvement in labs -Continue to  follow nephroogy's recommendations--continue TTS schedule  #HFrEF #HTN -Last echo from 1/25 shows LVEF 20-25% with decreased LV function and grade 3 diastolic dysfunction.  -Volume status improved after dialysis yesterday -PT noncompliant to GDMT   #Paroxysmal a-fib -Pt in NSR today -Continue eliquis  5mg  BID  #Rigth arm weakness #Prior CVA -CT head without acute abnormality. -No focal weakness noted on exam.  -Encephalopathy improving  #Psychiatric disorder -Pt with agitation today.  -Continue abilify  5mg  daily -Seroquel  25mg  at bedtime PRN  Best Practice: Diet: Renal diet IVF: Fluids: none, Rate: None VTE: eliquis  Code: Full  Disposition planning: Therapy Recs: None, DME: none DISPO: Anticipated discharge pending to Home pending clinical improvement.  Signature:  Schuyler Myrna [facility]  2:22 PM, 04/08/2024  On Call pager [phone]

## 2024-04-08 NOTE — Discharge Instructions (Addendum)
 To [name] or their caretakers,  They were admitted to 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 They were treated dialysis and supportive care.  They were discharged from the hospital on 04/09/24. I recommend the following after leaving the hospital:   Stop using drugs. This is the best thing you can do for your health.  You may have liver disease. I recommend a scan of your liver called an ultrasound.  Follow up with your primary care doctor within 2 weeks of hospital discharge.  For questions about your care plan, until you are able to see your primary doctor: Call [phone]. Dial  0 for the operator. Ask for the internal medicine resident on call.  Ronald Kung MD 04/09/2024, 11:28 AM   Outpatient Programs   Narcotics Anonymous 24-HOUR HELPLINE Pre-recorded for Meeting Schedules [facility] [phone]  [email or web address] ALCOHOLICS ANONYMOUS  [place]   Answering Service [phone] Please Note: All [place] Meetings are Non-smoking TonerProviders.com.cy     aanorthcarolina.Electrical engineer agencies for assistance with funding  [facility] for [facility] [address] [place], [place], [place] [address] Phone: [phone]  [facility]  [employer] [phone]- crisis line  [facility] (statewide facilities/programs) [address] (Medicaid/state funds) [place], [place] [address] [phone] Ronald Reeves- [phone] [place]- [phone] [phone]! [email or web address]  [facility] Pharmacist, community (Medicaid/state funds) Crisis line [phone] [facility] [phone] [place] [phone] [address] [phone]   [facility] [place] (2 Locations) (Medicaid/state funds) --[address]  walk in 8:30-12 and 1-2:30 [place], [id number]   [facility]- [phone] --[address] [place], [place] [address]  [phone]-[phone] walk in 8:30-12 and 2-3:30                         [facility] Substance Use and Mental Health Crisis Center-alternative to emergency rooms, jails or the streets. OPEN [address] HOURS EVERY DAY [address] [place], [place] [address]  [phone]  [place]- [address] [place], [place] [address]  [phone] (24 hours)  [place]- [address] Ronald Reeves [phone]  [place]- [address] Ronald Reeves [phone]                     7 day detox [phone] and [facility] [address] [place], [place] [address] [phone] (24 hours)  [place]- [address] [place], [place] [address] [phone]  [facility]- [address] [place], [place] [address] [phone] (24 hours)  [place] [address] [place], [place]  [address] [phone]  Ronald Reeves- [address] [place] [phone]  - [address] [place] [phone] Medicaid and state funds  [facility] Drug Services -  Insurance: Medicaid /State funding/private  insurance Methadone, suboxone  [place] [phone] Fax: [phone] [address] [place], [place], [address] [facility] [email or web address] Types of Program: Transitional housing, IOP, Outpatient treatment, [employer] funding/Medicaid Phone: [phone] Fax: [phone] Address: [address] [place] [address]  [facility], [place], most private insurance providers [address] [place], [place] [address] [phone] [facility] clinic Private insurance/Medicare/private pay Outpatient/suboxone/general medicine [address] [place], [place] [address]   [phone]  (Suboxone)  [facility] (MH/SA) Medicaid  [address]  [place], [place] [address] [phone]   Alcohol/drug Council of [place] Information and referral for programs for pregnant women [phone] [facility] Text  [phone]  Pablo GLEN[place]OD [place] 12pm -6pm  [facility] [place] [phone] [facility] [address] [place], [place] [address] [phone]- Substance abuse treatment  [facility] Medication management and therapy, IOP/PHP [address] [place], [place] [address] [phone] (Medicaid/state funds- individual -private insurance-group) [place]- State funds only MH/SA therapy and med management [address] [place], [place] [address] [phone] / [phone] Crisis Center: [phone]  [place] [facility] Inpt and outpt- insurance, Medicaid, self pay [address]  [place], [place] [address] [phone] [address] [place], [place] [address] [phone]   M-F 8-5  Residential Programs   [facility] Substance Abuse Treatment Center  Insurance: Medicaid, some private insurance Types of Program: Medical Detox, Residential Treatment  for [facility] residents Phone: [phone] Fax: [phone] [address] [place], [place], [place], [address]  [facility] Ronald Reeves (SelfDetection.tn) Only private insurance providers or self-pay Types of Program: CDIOP, Extended Treatment, Family Therapy, Group Counseling,  Inpatient program, Partial Programming  Phone: [phone] Fax:  [phone] Address: [address] [place], [address]  Dreams Insurance: Accepts those with and without insurance,  [facility] sponsorship  Type of Program: Residential Treatment Phone: [phone]   Address: [address], [place], [place], [address] [facility] (VeganReport.com.au) Insurance: Cynthia - based on those with income [phone]) and without ($285) Types of Program: 90-Day Program  Phone: [phone] Fax: [phone] Address: [address], [place], [place], [address]   [facility] (http://rivera-kline.com/) Insurance: n/a Types of Program: Residential Treatment Phone: [phone] Fax: [phone] Address: [address], [place], [place], [address]  [facility] (http://www.[email or web address].[email or web address]/~[email or web address].html) Insurance: n/a Types of Program: Residential Treatment, Substance Abuse Treatment for homeless women  Phone: [phone] Fax: [phone] Address: [address], [place], [address]  [facility] (ages 67-40) (www.teenchallengeusa.com) Insurance: n/a; $700 entrance fee, including $50 non-refundable application fee  Types of Program: Residential Treatment Phone: [phone] Fax: [phone] Address: [address], [place], [place], [address] (Statewide locations) [facility] [place]  -[place] or [place] [phone]  [employer] (FunnyTan.co.nz)  Insurance: n/a; fees range from [phone] (rent) Types of Program: Sober living for those in recovery Phone: [phone] [facility]) / [phone] (Will) / [phone] Marny)    [facility] (women) [address] [place], [place] [address] [phone] FAX [phone]  [address] Work based (manual labor) Residential- minimum 2 years [phone] [address]  [place], [place] [address]  [facility] Insurance: n/a 9-12 month recovery- WOMEN and children [address] [address]  [place], [place] [address]  Phone: [phone] [facility]  WOMEN 1 yr -[place] based- no meds [address] [place], [place] [address]  [phone]  [facility] [place]   Alcohol  and Drug Services -  Insurance: Medicaid /State funding/private insurance Methadone, suboxone [address] [place], [place] [address] [phone] [facility] [place]-  [address]  [place]  [phone] 7 day detox   [facility] (http://www.BargainMaintenance.[email or web address]) State funds, self-pay, some private insurance providers Phone: [phone] Fax: [phone] Address: [address] [place], [place], [place], [address] [employer] n/a; work-based program for chronic relapse patients  Types of Program: Residential Treatment  Phone: [phone] Fax: [phone] Address: [address] [place], [place],  [address]  [facility] (www.thefellowshiphome.org)  Insurance: n/a; $75-100/week Types of Program: Individual/Group Therapy, 2-year Relapse Prevention program  Phone: [phone] Fax:  [phone] Address: [address], [place], [place], [address] [facility]  Insurance: n/a Types of Program: Residential Treatment, Housing program, Work Therapy Phone: [phone] Fax: [phone] Address:  [address] [place], [place], [address]  [employer]: n/a Types of Programs: Residential Treatment Phone: [phone]    Address: [address], [place], [place], [address] [employer] - 6-12 months Cardinal/Medicaid/medicare/disability [address] (main office) [place], [place] [address] [phone]  [facility]   [facility] [place] (http://www.http://www.ball-ray.net/) Insurance: n/a; clients must pay a cash fee of $3,220  upfront Types of Program: Residential Treatment Phone: [phone] Fax: [phone] Address: [address] [place], [place], [address] [facility] Insurance: n/a; must be able to work on site Types  of Program: Residential Treatment Phone: [phone] Irvin Mars) / [phone] Darrel Griffiths) Address: [address], [place], [place], [address]  [facility]   [facility] - [employer]- women (http://charlotterescuemission.org/) Insurance: n/a  Types of Programs: Residential Treatment Phone: [phone] Fax: [phone] Address: [address], [place], [place], [address] [facility] Insurance: Medicaid, all private insurance providers not in a network  Types of Program: Drug Education, DUI Treatment, Methadone program, Residential Treatment, Substance Abuse Intensive Outpatient Phone: [phone] Fax: [phone] Address: [address], [place], [place], [address]  [facility] - [employer] - Men Insurance: n/a  Types of Program: Residential Treatment Phone: [phone] Fax: [phone] Address: [address] [place], [place], [address] [facility].  Insurance: n/a; the fee for the first 90 days will be waived upon completion of the entire program. Types of Program: Men and Women's, Families Level,     [facility] for the homeless  Phone: [phone] Fax: [phone] Address: [address], [place], [place], [address]  [facility]   [facility] (ARanked.fi)  Insurance: n/a; [phone] for 30 days, afterwards clients are expected to work onsite to maintain stay  Types of Program: Aftercare services, Day Program,   (partially funded)  Phone: [phone] Fax: [phone] Address: [address] - [address] - [place], [place]  [address]  [facility] [address] [facility] [place] (http://www.flynnhickory.org/) Insurance: n/a; $250.00/first two weeks and $125/week afterwards Types of Program: Housing for clients who do not suffer from psychological or medical problems Phone: [phone] Fax: [phone] Address: [address], [place], [place], [address]  [facility]  (DrawPay.co.nz) Insurance: n/a; $150 entry fee  Types of Program: Therapeutic Community   Phone: [phone] Fax: [phone]  Address: [address] [place], [place], [address] [place] [employer]: n/a Types of Program: Residential Treatment Phone: [phone] Fax: [phone] Address: [address], [place], [place], [address]  [facility]  Insurance: $300 entry fee-(some scholarships available) Types of Program: Residential Treatment  Phone: [phone] Fax: [phone] Address: [address], [place], [place], [address] [facility] [place] [facility]) Insurance: n/a; sliding scale between $6,000-$12,000 depending on income Types of Program: Residential Treatment Phone: [phone] Fax: [phone] Address: [address]nd [address] Georgina HEATH [place], [place], [address]  [facility] [place] (women 18-39 yo) 1 year Minimum $400 entrance fee [address] [place], [place] [address] [phone] Fax [phone] [facility] [address] [place] [address] Phone: [phone] Fax [phone] $250 entry fee  [facility] insurance only [address], [place], [place] [address] Phone: [phone]   [place]   [facility] (RainTreatment.es) Insurance: n/a  Types of Program: Emergency overnight stay, Medical Detox, Recovery program for [facility] residents, 74-month Residential Treatment  Phone: [phone] Fax: [phone] Address: [address] [place], [place], [address] [facility] (http://www.church.org/) Insurance: n/a Types of Program: Residential Treatment  Phone: [phone] Fax: [phone] Address: [address], [place], [address]   [facility], [employer]are, [place] is accepted but will only pay for certain services Types of Program: Inpatient and Outpatient Treatment Phone: [phone] Fax: [phone]  Address: [address]  [place], [place], [address] Other Locations: [facility] [address], [place], [place], [place], [address] [facility] [address], [place], [place], [address] [facility] (Men Only)  Insurance: n/a; $91/week Types of Program: Housing program for men who are employed full time (up to 6 months) Phone: [phone] Fax: [phone] Address: [address], [place], [place], [address]    [facility] (Men Only) -   program is free for 30 days, afterwards residents may stay on site but are required to have a job or be able to pay $55.00/week. Types of Program: Meals, Shelter, Civil Service fast streamer, Pregnancy services Phone: [phone] Fax: [phone] Address: [address], [place],  [address]  [facility] (RTS) Detox/crisis, residential, [address], [place], [place] [address] Phone: [phone] State funding and financial assistance   [facility] women- 6 mos $500 entry fee [address] [place], [place] [address] [phone] [facility] MH/SA opt and residential [address], [place], [place] [address] [place], [place] [address] [phone]  Other/out of state   [facility] Treatment Insurance: self pay or cardinal/partners Types of Program: Residential Treatment Phone: Men's Division [phone]                Women's Division [phone]   Address:  [facility] [address], [place], [place], [address] [employer] (http://owlsnestrecovery.com/) Insurance: n/a; fees range from [phone] Types of Program: Intensive Substance Abuse Rehabilitation Phone: [phone] Fax: [phone]  Address: [address], [place], [place], [address]   [facility] (www.bridgetorecovery.org) $2500.00 for 30 day program Types of Program: Transitional Housing Phone: [phone] Fax: [phone] Address: [address] [place], [place], [address]   [facility]  Insurance: n/a Types of Program: Residential Treatment (does not allow behavioral health medications) Phone: [phone] Fax: [phone]  Address: [address] [place], [address]  [facility] [place] (http://[place].LogTrades.[email or web address])  Insurance: Cash, private pay [phone] for 28 days) Types of Program: Chronic Pain Recovery, DUI Treatment, Family Therapy, Gender-Specific Therapy, Opiate  Recovery, Residential Treatment Phone: [phone] Fax: [phone] Address: [address] [place], [place], [address] [facility] (Alcohol  and Drug Addiction Treatment  Center)  Medicaid, Medicare and private insurance  providers; cost of $517.00/day  Types of Program: 7-day Detoxification program and 14-day Rehabilitation program  Phone: [phone]: [phone] Address: [address], [place], [place], [address]  [facility] [employer] locations [employer] only [place] [phone]

## 2024-04-08 NOTE — Progress Notes (Signed)
 OT Cancellation Note  Patient Details Name: Isaish Alemu MRN: [id number] DOB: [birth year]   Cancelled Treatment:    Reason Eval/Treat Not Completed: Other (comment) Pt restless, confused and requiring restraints. RN asked therapy to hold attempts this AM. Will follow up later as schedule permits.  Mliss Fish 04/08/2024, 8:07 AM

## 2024-04-08 NOTE — Progress Notes (Addendum)
 IMTS Cross Cover Note  Glucose 52 noted on RFP drawn at 20:15; RN notified who checked patient's CBG 44 @21 :40. Patient was given OJ, CBG remained low 48 at recheck. Patient was started on IV dextrose  5% at 40 mL/hr; repeat CBG 93->91.   Wife at bedside also had concerns about patient. She reported that patient is a avid cocaine  and marijuana user. Reported that she is unable to take care of him anymore. Reports that she would like him to go to rehab. TOC consult is placed.   On exam, patient was oriented to self and location. He appeared anxious; we avoid any medication that though would worse mentation. Patient was given one time dose atarax  25 mg.    Toma Edwards, DO Internal Medicine Resident PGY-2 Please contact the on-call pager after 5 pm and on weekends at [phone]

## 2024-04-08 NOTE — Progress Notes (Signed)
 PT Cancellation Note  Patient Details Name: Vicent Febles MRN: [id number] DOB: 05-13-1956   Cancelled Treatment:    Reason Eval/Treat Not Completed: Medical issues which prohibited therapy RN request hold, pt being transferred to PCU.   Bethany Hirt B. Fleeta Lapidus PT, DPT Acute Rehabilitation Services Please use secure chat or  Call Office [phone]   Almarie KATHEE Fleeta Fleet 04/08/2024, 1:01 PM

## 2024-04-08 NOTE — Progress Notes (Signed)
 IMTS Cross Cover Note  Received message by RN about patient leaving AMA. Patient is evaluated at bedside with wife present and RN present. Patient reports that he is doing well; apart from chronic low back pain. Reports that he wants to go home because he wants to celebrate his anniversary with his wife. Patient is awake, alert and oriented to self, knows that he is in [facility], knows the current month and year. He is oriented to the events in the hospital. He reported that he wants to leave AMA tonight. He understands the consequences of leaving AMA can result in deterioration in his condition. We informed him that he should at least stay the night and get the RUQ US  and HD session tomorrow morning, but he refused and stated that he can go to his HD outpatient center tomorrow. In the end, patient's wife refused to take him home. Patient requested to have alone time to speak with his wife.   Later on, RN reached out saying that patient cooperated and took his night medications. Wife requested for melatonin, which was ordered.  For now, we will continue to monitor that patient overnight. If he requests to leave AMA, we will re-evaluate capacity.

## 2024-04-08 NOTE — Progress Notes (Signed)
 Hypoglycemic Event  CBG: 66  Treatment: 8 oz juice/soda  Symptoms: Hungry  Follow-up CBG: Time:1530 CBG Result:159   Possible Reasons for Event: Inadequate meal intake  MD notified.    Ronald Reeves [name]

## 2024-04-08 NOTE — Progress Notes (Signed)
 SLP Cancellation Note  Patient Details Name: Ronald Reeves MRN: [id number] DOB: 1955/10/22   Cancelled treatment:       Reason Eval/Treat Not Completed: SLP consulted due to AMS this date that has since resolved. Discussed with MD, who states a swallowing evaluation is no longer necessary. Will sign off but please re-consult if acute needs arise.    Damien Blumenthal, M.A., CCC-SLP Speech Language Pathology, Acute Rehabilitation Services  Secure Chat preferred [phone]  04/08/2024, 3:19 PM

## 2024-04-08 NOTE — Plan of Care (Signed)

## 2024-04-09 DIAGNOSIS — R2689 Other abnormalities of gait and mobility: Secondary | ICD-10-CM | POA: Diagnosis present

## 2024-04-09 DIAGNOSIS — R188 Other ascites: Secondary | ICD-10-CM | POA: Diagnosis present

## 2024-04-09 LAB — RENAL FUNCTION PANEL
Albumin: 2.4 g/dL — ABNORMAL LOW (ref 3.5–5.0)
Anion gap: 16 — ABNORMAL HIGH (ref 5–15)
BUN: 62 mg/dL — ABNORMAL HIGH (ref 8–23)
CO2: 23 mmol/L (ref 22–32)
Calcium: 9.5 mg/dL (ref 8.9–10.3)
Chloride: 93 mmol/L — ABNORMAL LOW (ref 98–111)
Creatinine, Ser: 5.68 mg/dL — ABNORMAL HIGH (ref 0.61–1.24)
GFR, Estimated: 10 mL/min — ABNORMAL LOW (ref >60)
Glucose, Bld: 98 mg/dL (ref 70–99)
Phosphorus: 7.4 mg/dL — ABNORMAL HIGH (ref 2.5–4.6)
Potassium: 5.5 mmol/L — ABNORMAL HIGH (ref 3.5–5.1)
Sodium: 132 mmol/L — ABNORMAL LOW (ref 135–145)

## 2024-04-09 LAB — CBC
HCT: 36.9 % — ABNORMAL LOW (ref 39.0–52.0)
Hemoglobin: 12.5 g/dL — ABNORMAL LOW (ref 13.0–17.0)
MCH: 26.4 pg (ref 26.0–34.0)
MCHC: 33.9 g/dL (ref 30.0–36.0)
MCV: 78 fL — ABNORMAL LOW (ref 80.0–100.0)
Platelets: 129 K/uL — ABNORMAL LOW (ref 150–400)
RBC: 4.73 MIL/uL (ref 4.22–5.81)
RDW: 16.9 % — ABNORMAL HIGH (ref 11.5–15.5)
WBC: 5.5 K/uL (ref 4.0–10.5)
nRBC: 0 % (ref 0.0–0.2)

## 2024-04-09 LAB — GLUCOSE, CAPILLARY
Glucose-Capillary: 107 mg/dL — ABNORMAL HIGH (ref 70–99)
Glucose-Capillary: 115 mg/dL — ABNORMAL HIGH (ref 70–99)
Glucose-Capillary: 87 mg/dL (ref 70–99)
Glucose-Capillary: 96 mg/dL (ref 70–99)

## 2024-04-09 LAB — HEPATITIS B SURFACE ANTIBODY, QUANTITATIVE: Hep B S AB Quant (Post): <3.5 m[IU]/mL — ABNORMAL LOW

## 2024-04-09 MED ORDER — HEPARIN SODIUM (PORCINE) 1000 UNIT/ML IJ SOLN
2000.0000 [IU] | Freq: Once | INTRAMUSCULAR | Status: AC
  Administered 2024-04-09: 2000 [IU]

## 2024-04-09 MED ORDER — ANTICOAGULANT SODIUM CITRATE 4% (200MG/5ML) IV SOLN: 5.0000 mL | Status: DC | PRN

## 2024-04-09 MED ORDER — PENTAFLUOROPROP-TETRAFLUOROETH EX AERO: 1.0000 | INHALATION_SPRAY | CUTANEOUS | Status: DC | PRN

## 2024-04-09 MED ORDER — LIDOCAINE-PRILOCAINE 2.5-2.5 % EX CREA
1.0000 | TOPICAL_CREAM | CUTANEOUS | Status: DC | PRN
Start: 2024-04-09 — End: 2024-04-09

## 2024-04-09 MED ORDER — HEPARIN SODIUM (PORCINE) 1000 UNIT/ML IJ SOLN
4000.0000 [IU] | Freq: Once | INTRAMUSCULAR | Status: AC
  Administered 2024-04-09: 4000 [IU]

## 2024-04-09 MED ORDER — HEPARIN SODIUM (PORCINE) 1000 UNIT/ML IJ SOLN
INTRAMUSCULAR | Status: AC
  Filled 2024-04-09: qty 6

## 2024-04-09 MED ORDER — HEPARIN SODIUM (PORCINE) 1000 UNIT/ML DIALYSIS
1000.0000 [IU] | INTRAMUSCULAR | Status: DC | PRN
  Administered 2024-04-09: 3800 [IU]

## 2024-04-09 MED ORDER — HEPARIN SODIUM (PORCINE) 1000 UNIT/ML DIALYSIS
6000.0000 [IU] | Freq: Once | INTRAMUSCULAR | Status: DC
Start: 2024-04-09 — End: 2024-04-09

## 2024-04-09 MED ORDER — ALTEPLASE 2 MG IJ SOLR: 2.0000 mg | Freq: Once | INTRAMUSCULAR | Status: DC | PRN

## 2024-04-09 MED ORDER — HEPARIN SODIUM (PORCINE) 1000 UNIT/ML IJ SOLN
INTRAMUSCULAR | Status: AC
  Filled 2024-04-09: qty 4

## 2024-04-09 MED ORDER — LIDOCAINE HCL (PF) 1 % IJ SOLN: 5.0000 mL | INTRAMUSCULAR | Status: DC | PRN

## 2024-04-09 MED ORDER — THIAMINE MONONITRATE 100 MG PO TABS
100.0000 mg | ORAL_TABLET | Freq: Every day | ORAL | Status: DC
  Administered 2024-04-10 – 2024-04-15 (×5): 100 mg via ORAL
  Filled 2024-04-09 (×6): qty 1

## 2024-04-09 NOTE — Evaluation (Signed)
 Occupational Therapy Evaluation Patient Details Name: Ronald Reeves MRN: [id number] DOB: [birth year] Today's Date: 04/09/2024   History of Present Illness   Pt is a 68 yr old male who presented 04/07/24 due to AMS and admitted due to toxic metabolic encephalopathy. PMH includes ESRD on HD, anemia, arthritis,COPD, CAD, HFrEF, polysubstance abuse, HTN, GERD, BPH, afib.     Clinical Impressions Pt presented with wife and was seen with Physical Therapy. Pt initially was very lethargic and reporting they were in [place]. Pt with increase in time was able to get to EOB mod x2 and attempted two  sit to stand transfers one without walker and one with walker with both max x2. Pt then requesting to lay back into bed. Patient will benefit from continued inpatient follow up therapy, <3 hours/day.      If plan is discharge home, recommend the following:   Two people to help with walking and/or transfers;Two people to help with bathing/dressing/bathroom;Assistance with cooking/housework;Assist for transportation;Supervision due to cognitive status;Help with stairs or ramp for entrance     Functional Status Assessment   Patient has had a recent decline in their functional status and demonstrates the ability to make significant improvements in function in a reasonable and predictable amount of time.     Equipment Recommendations   None recommended by OT     Recommendations for Other Services         Precautions/Restrictions   Precautions Precautions: Fall Recall of Precautions/Restrictions: Impaired Restrictions Weight Bearing Restrictions Per Provider Order: No     Mobility Bed Mobility Overal bed mobility: Needs Assistance Bed Mobility: Supine to Sit, Sit to Supine     Supine to sit: Mod assist, +2 for physical assistance Sit to supine: Mod assist   General bed mobility comments: Mod A for LE and trunk to mid line to sit EOB. Pt with mod A for LE to bed.    Transfers Overall  transfer level: Needs assistance Equipment used: Rolling walker (2 wheels), 2 person hand held assist Transfers: Sit to/from Stand Sit to Stand: Max assist, +2 physical assistance, +2 safety/equipment           General transfer comment: Pt unable to get fully upright with bil flexion contractures with L > R. Pt attempted to sit down after brief time standing due to pain. Retropulsion in standing worse with AD      Balance Overall balance assessment: Needs assistance Sitting-balance support: Bilateral upper extremity supported, Feet supported Sitting balance-Leahy Scale: Poor Sitting balance - Comments: CGA to Min A sitting EOB Postural control: Posterior lean Standing balance support: Bilateral upper extremity supported, Reliant on assistive device for balance Standing balance-Leahy Scale: Zero Standing balance comment: Requires 2 person max A to stay standing.                           ADL either performed or assessed with clinical judgement   ADL Overall ADL's : Needs assistance/impaired Eating/Feeding: Minimal assistance;Moderate assistance;Sitting;Bed level   Grooming: Wash/dry face;Contact guard assist;Sitting   Upper Body Bathing: Minimal assistance;Sitting   Lower Body Bathing: Maximal assistance;Sit to/from stand;Total assistance;+2 for physical assistance;+2 for safety/equipment   Upper Body Dressing : Minimal assistance;Moderate assistance;Sitting   Lower Body Dressing: Maximal assistance;+2 for physical assistance;+2 for safety/equipment;Sit to/from stand   Toilet Transfer: Total assistance;+2 for physical assistance;+2 for safety/equipment   Toileting- Clothing Manipulation and Hygiene: Total assistance;Sit to/from stand  Vision Baseline Vision/History: 1 Wears glasses       Perception         Praxis         Pertinent Vitals/Pain Pain Assessment Pain Assessment: Faces Faces Pain Scale: Hurts whole lot Breathing:  normal Negative Vocalization: none Facial Expression: smiling or inexpressive Body Language: relaxed Consolability: no need to console PAINAD Score: 0 Pain Location: low back Pain Descriptors / Indicators: Aching, Discomfort, Grimacing, Guarding Pain Intervention(s): Limited activity within patient's tolerance, Monitored during session, Repositioned     Extremity/Trunk Assessment Upper Extremity Assessment Upper Extremity Assessment: Overall WFL for tasks assessed   Lower Extremity Assessment Lower Extremity Assessment: Defer to PT evaluation   Cervical / Trunk Assessment Cervical / Trunk Assessment: Kyphotic Cervical / Trunk Exceptions: back pain   Communication Communication Communication: Impaired Factors Affecting Communication: Difficulty expressing self;Reduced clarity of speech   Cognition Arousal: Lethargic Behavior During Therapy: Flat affect Cognition: Cognition impaired   Orientation impairments: Situation, Time, Place Awareness: Online awareness impaired, Intellectual awareness impaired Memory impairment (select all impairments): Short-term memory, Working memory Attention impairment (select first level of impairment): Selective attention Executive functioning impairment (select all impairments): Sequencing, Reasoning, Problem solving OT - Cognition Comments: Pt when first reporting they were in [place] and then able to report in [place]                 Following commands: Impaired Following commands impaired: Follows one step commands inconsistently, Follows one step commands with increased time     Cueing  General Comments   Cueing Techniques: Verbal cues;Gestural cues;Tactile cues;Visual cues  spouse present   Exercises     Shoulder Instructions      Home Living Family/patient expects to be discharged to:: Private residence Living Arrangements: Spouse/significant other Available Help at Discharge: Family;Available PRN/intermittently Type of  Home: House Home Access: Ramped entrance     Home Layout: One level     Bathroom Shower/Tub: Chief Strategy Officer: Standard Bathroom Accessibility: Yes   Home Equipment: Agricultural consultant (2 wheels);Rollator (4 wheels);Cane - single point;Shower seat;BSC/3in1;Wheelchair - manual   Additional Comments: Pt lives with wife who works during the day      Prior Functioning/Environment Prior Level of Function : Needs assist;History of Falls (last six months)             Mobility Comments: Spouse assists with transfers to W/C and occasionally uses rollator. Spouse reports multiple falls. ADLs Comments: Pt spouse reports that he has gotten progressively weaker and she is helping with toileting, ambulating/transfers.    OT Problem List: Decreased strength;Decreased range of motion;Decreased activity tolerance;Impaired balance (sitting and/or standing);Decreased safety awareness;Decreased knowledge of use of DME or AE;Pain   OT Treatment/Interventions: Self-care/ADL training;Therapeutic exercise;DME and/or AE instruction;Therapeutic activities;Patient/family education      OT Goals(Current goals can be found in the care plan section)   Acute Rehab OT Goals Patient Stated Goal: to get rehab OT Goal Formulation: With patient Time For Goal Achievement: 04/23/24 Potential to Achieve Goals: Fair   OT Frequency:  Min 2X/week    Co-evaluation              AM-PAC OT 6 Clicks Daily Activity     Outcome Measure Help from another person eating meals?: A Little Help from another person taking care of personal grooming?: A Little Help from another person toileting, which includes using toliet, bedpan, or urinal?: Total Help from another person bathing (including washing, rinsing, drying)?: A Lot Help from  another person to put on and taking off regular upper body clothing?: A Little Help from another person to put on and taking off regular lower body clothing?: A  Lot 6 Click Score: 14   End of Session Equipment Utilized During Treatment: Gait belt Nurse Communication: Mobility status  Activity Tolerance: Patient limited by fatigue;No increased pain Patient left: in bed;with call bell/phone within reach;with bed alarm set  OT Visit Diagnosis: Unsteadiness on feet (R26.81);Other abnormalities of gait and mobility (R26.89);Repeated falls (R29.6);Muscle weakness (generalized) (M62.81);Pain Pain - part of body:  (back)                Time: 8661-8594 OT Time Calculation (min): 27 min Charges:  OT General Charges $OT Visit: 1 Visit  Warrick POUR OTR/L  Acute Rehab Services  [phone] office number   [name] 04/09/2024, 2:37 PM

## 2024-04-09 NOTE — Evaluation (Signed)
 Physical Therapy Evaluation Patient Details Name: Ronald Reeves MRN: [id number] DOB: November 20, 1955 Today's Date: 04/09/2024  History of Present Illness  Pt is a 68 yr old male who presented 04/07/24 due to AMS and admitted due to toxic metabolic encephalopathy. PMH includes ESRD on HD, anemia, arthritis,COPD, CAD, HFrEF, polysubstance abuse, HTN, GERD, BPH, afib.  Clinical Impression  Pt is presenting below baseline level of functioning. Prior to hospitalization spouse was able to assist. Currently pt is 2 person assist for bed mobility and sit to stand with knee flexion contractures with L> R. Pt unable to progress ambulation due to pain/weakness. Due to pt current functional status, home set up and available assistance at home recommending skilled physical therapy services < 3 hours/day in order to address strength, balance and functional mobility to decrease risk for falls, injury, immobility, skin break down and re-hospitalization.          If plan is discharge home, recommend the following: Two people to help with walking and/or transfers;Assist for transportation;Assistance with cooking/housework;Supervision due to cognitive status;Help with stairs or ramp for entrance   Can travel by private vehicle   No    Equipment Recommendations Hospital bed;Hoyer lift     Functional Status Assessment Patient has had a recent decline in their functional status and demonstrates the ability to make significant improvements in function in a reasonable and predictable amount of time.     Precautions / Restrictions Precautions Precautions: Fall Recall of Precautions/Restrictions: Impaired Restrictions Weight Bearing Restrictions Per Provider Order: No      Mobility  Bed Mobility Overal bed mobility: Needs Assistance Bed Mobility: Supine to Sit, Sit to Supine     Supine to sit: Mod assist, +2 for physical assistance Sit to supine: Mod assist   General bed mobility comments: Mod A for LE and  trunk to mid line to sit EOB. Pt with mod A for LE to bed.    Transfers Overall transfer level: Needs assistance Equipment used: Rolling walker (2 wheels), 2 person hand held assist Transfers: Sit to/from Stand Sit to Stand: Max assist, +2 physical assistance, +2 safety/equipment           General transfer comment: Pt unable to get fully upright with bil flexion contractures with L > R. Pt attempted to sit down after brief time standing due to pain. Retropulsion in standing worse with AD    Ambulation/Gait       General Gait Details: unable     Balance Overall balance assessment: Needs assistance Sitting-balance support: Bilateral upper extremity supported, Feet supported Sitting balance-Leahy Scale: Poor Sitting balance - Comments: CGA to Min A sitting EOB Postural control: Posterior lean Standing balance support: Bilateral upper extremity supported, Reliant on assistive device for balance Standing balance-Leahy Scale: Zero Standing balance comment: Requires 2 person max A to stay standing.       Pertinent Vitals/Pain Pain Assessment Pain Assessment: Faces Faces Pain Scale: Hurts whole lot Pain Location: low back Pain Descriptors / Indicators: Aching, Discomfort, Grimacing, Guarding Pain Intervention(s): Monitored during session, Limited activity within patient's tolerance    Home Living Family/patient expects to be discharged to:: Private residence Living Arrangements: Spouse/significant other Available Help at Discharge: Family;Available PRN/intermittently Type of Home: House Home Access: Ramped entrance       Home Layout: One level Home Equipment: Agricultural consultant (2 wheels);Rollator (4 wheels);Cane - single point;Shower seat;BSC/3in1;Wheelchair - manual Additional Comments: Pt lives with wife who works during the day    Prior Function Prior Level of  Function : Needs assist;History of Falls (last six months)             Mobility Comments: Spouse  assists with transfers to W/C and occasionally uses rollator. Spouse reports multiple falls. ADLs Comments: Pt spouse reports that he has gotten progressively weaker and she is helping with toileting, ambulating/transfers.     Extremity/Trunk Assessment   Upper Extremity Assessment Upper Extremity Assessment: Defer to OT evaluation    Lower Extremity Assessment Lower Extremity Assessment: Generalized weakness    Cervical / Trunk Assessment Cervical / Trunk Assessment: Kyphotic;Other exceptions Cervical / Trunk Exceptions: back pain  Communication   Communication Communication: Impaired Factors Affecting Communication: Difficulty expressing self;Reduced clarity of speech    Cognition Arousal: Lethargic Behavior During Therapy: Flat affect   PT - Cognitive impairments: Orientation, Awareness, Memory, Safety/Judgement, Problem solving, Sequencing, Initiation   Orientation impairments: Place, Time, Situation     PT - Cognition Comments: Pt confused as to where he is and why he is here. Oriented to spouse and self. Eventually stated he is in [place]. Following commands: Impaired Following commands impaired: Follows one step commands inconsistently, Follows one step commands with increased time     Cueing Cueing Techniques: Verbal cues, Gestural cues, Tactile cues, Visual cues     General Comments General comments (skin integrity, edema, etc.): Spouse present during session        Assessment/Plan    PT Assessment Patient needs continued PT services  PT Problem List Decreased strength;Decreased cognition;Decreased activity tolerance;Decreased mobility;Decreased safety awareness;Decreased range of motion;Pain       PT Treatment Interventions DME instruction;Balance training;Gait training;Neuromuscular re-education;Stair training;Functional mobility training;Therapeutic activities;Therapeutic exercise;Patient/family education;Wheelchair mobility training    PT Goals  (Current goals can be found in the Care Plan section)  Acute Rehab PT Goals Patient Stated Goal: Spouse would like for pt to get stronger PT Goal Formulation: With patient/family Time For Goal Achievement: 04/23/24 Potential to Achieve Goals: Fair    Frequency Min 2X/week        AM-PAC PT 6 Clicks Mobility  Outcome Measure Help needed turning from your back to your side while in a flat bed without using bedrails?: A Lot Help needed moving from lying on your back to sitting on the side of a flat bed without using bedrails?: A Lot Help needed moving to and from a bed to a chair (including a wheelchair)?: Total Help needed standing up from a chair using your arms (e.g., wheelchair or bedside chair)?: Total Help needed to walk in hospital room?: Total Help needed climbing 3-5 steps with a railing? : Total 6 Click Score: 8    End of Session Equipment Utilized During Treatment: Gait belt Activity Tolerance: Patient limited by pain Patient left: in bed;with call bell/phone within reach;with family/visitor present;with bed alarm set Nurse Communication: Mobility status PT Visit Diagnosis: Unsteadiness on feet (R26.81);Other abnormalities of gait and mobility (R26.89);Muscle weakness (generalized) (M62.81);History of falling (Z91.81);Difficulty in walking, not elsewhere classified (R26.2)    Time: 8661-8594 PT Time Calculation (min) (ACUTE ONLY): 27 min   Charges:   PT Evaluation $PT Eval Low Complexity: 1 Low   PT General Charges $$ ACUTE PT VISIT: 1 Visit        Dorothyann Maier, DPT, CLT  Acute Rehabilitation Services Office: [phone] (Secure chat preferred)   Dorothyann VEAR Maier 04/09/2024, 2:21 PM

## 2024-04-09 NOTE — Progress Notes (Addendum)
 Interval history Agitated overnight.  Apparently was berating his wife, telling her to bring cocaine  to the hospital so he can use it.  Chewed through telemetry wires at one point.  No new complaints or concerns this morning.  Wants to leave after dialysis session.  Talked to his wife, she has concerns about taking him home because of his physical debility and deconditioning.  He has frequent falls at home.  Physical exam Blood pressure 128/77, pulse 86, temperature (!) 97.5 F (36.4 C), temperature source Axillary, resp. rate 20, height 5' 11 (1.803 m), weight 57.7 kg, SpO2 100%.  No distress Heart rate and rhythm are normal Breathing comfortably on room air Skin is warm and dry Alert and oriented, speech is normal, no facial asymmetry, moving extremities normally  Weight change: -13.9 kg   Intake/Output Summary (Last 24 hours) at 04/09/2024 1401 Last data filed at 04/09/2024 1317 Gross per 24 hour  Intake 693.99 ml  Output 6000 ml  Net -5306.01 ml   Net IO Since Admission: -7,056.01 mL [04/09/24 1401]  Assessment and plan Hospital day 1  Ronald Reeves is a 68 y.o. admitted for toxic-metabolic encephalopathy improving after dialysis and supportive care, medically stable for discharge pending safe discharge plan.  Principal Problem (Resolved):   Acute metabolic encephalopathy Resolved, but remains at high risk for hospital delirium given his chronic poor health status with multiple comorbid severe chronic diseases and polysubstance use disorder. Seroquel  25 mg was used last night to good effect for agitation.  Active Problems:   Decreased mobility Worked with PT today.  Their recommendation is for subacute rehab at SNF because of poor mobility.  Wife corroborates this, apparently has frequent falls at home.  If he is amenable to SNF for subacute rehab will get that process started.    Thrombocytopenia (HCC)   Ascites Cirrhosis first noted during a recent  hospitalization for shortness of breath due to volume overload.  He had cirrhotic liver morphology on imaging then.  He had moderate abdominal pelvic ascites then with fluid pocket too small to drain per IR.  He likely does have cirrhosis.  If he is amenable to repeating the right upper quadrant ultrasound with liver Doppler during hospitalization that would be reasonable.    ESRD (end stage renal disease) on dialysis TTS schedule (HCC) Chronic, stable now.  HD via right tunneled internal jugular catheter.  Required urgent dialysis on admission.  Frequently gets outpatient dialysis treatment short.  Now back on routine dialysis schedule, last was today with 3 L ultrafiltrate removed.    Cocaine  use disorder, severe, dependence (HCC) Chronic, continues to use with negative effects on health.  May be contributor to his presenting encephalopathy syndrome.  Wife is interested in inpatient drug rehab but is finding it challenging to find a facility that can offer this to him given his needs including dialysis.    Essential hypertension Chronic and stable.  Normotensive in the hospital.  Not requiring his outpatient carvedilol , hydralazine , or Imdur .    Alcohol use disorder, moderate, in sustained remission (HCC) Chronic and stable.  No alcohol withdrawal.    HFrEF (heart failure with reduced ejection fraction) (HCC) Chronic, stable.  Echo in January 2025 showed EF 20 to 25% with grade 3 diastolic dysfunction.  Seems well compensated at present.  Normotensive, not on any antihypertensives or GDMT at present.  Holding his outpatient carvedilol  and Lasix .  MDD (major depressive disorder), recurrent severe, without psychosis (HCC) Chronic, stable.  Continue outpatient aripiprazole .    Paroxysmal atrial fibrillation (HCC) In sinus rhythm during hospitalization.  Continue apixaban .  Resolved Problems:   Uremic encephalopathy   Hypoglycemia   Hyperkalemia   Hyponatremia  VTE prophylaxis:  Diet:  Regular IVF: N/A Code: Full PT/OT recommendations: SNF for subacute rehab TOC recommendations: Pending Family Update: Wife, by phone  Discharge plan: Medically ready for discharge, pending SNF placement  [name] MD 04/09/2024, 2:01 PM  Pager: [phone]

## 2024-04-09 NOTE — Progress Notes (Addendum)
 Ronald Reeves is an 68 y.o. male with ESRD on HD TTS, HTN, PAFib on Eliquis , HFrEF 20%, polysubstance abuse who presented to the ED last night after a fall at home. No acute findings on imaging. Labs this am notable for hyperkalemia, K 6.6.  Calcium  gluconate, insulin  have been ordered in the ED. Nephrology consulted for dialysis.    Dialysis via [facility]. Last dialysis was on Tuesday for 2 hr 30 mins. Frequently cuts dialysis treatments short. Prior 3 treatments have been less than 2 hours.    Seen and examined in the ED. He is alone. He is disoriented and not able to provide much history. Falls asleep during questioning.    Dialysis Orders:  [facility] T[facility] 4:00 BFR 400 EDW 62 kg 2K/2Ca TDC - Heparin  4000 + 2000 midrun  - calcitriol  2.25 q HD, sensipar  30 q HD   Assessment/Plan: Hyperkalemia resolved. Rx temporizing meds. Urgent dialysis on 8/28.  ESRD.  HD TTS. Not adherent to full Rx as outpatient. HD today as above.    Seen on dialysis through a right IJ tunnel catheter 3 liter net UF goal 114/70 and stable on a 2K bath.   Many interactions with them over the past couple years; often had very aggressive speech towards the spouse.  He has been missing treatments or signing off very early, continuing to use cocaine   Access. TDC in use  Hypertension. BP acceptable  Volume. UF to EDW as able  Anemia. Hgb above goal  Metabolic bone disease.  Ca acceptable. Follow if admitted.  HFrEF 20%. Optimize volume with HD as able  Subjective: Complaining of pain in the abdomen; he is not agitated anymore and tried to answer question initially but then seemed to be noncooperative because he wanted medicine for the abdominal pain.      Chemistry and CBC: Creat  Date/Time Value Ref Range Status  09/01/2013 04:15 PM 1.08 0.50 - 1.35 mg/dL Final  89/79/7985 93:99 PM 1.23 0.50 - 1.35 mg/dL Final   Creatinine, Ser  Date/Time Value Ref Range Status  04/09/2024 03:45 AM 5.68 (H) 0.61 - 1.24 mg/dL Final   91/70/7974 98:72 AM 4.62 (H) 0.61 - 1.24 mg/dL Final  91/71/7974 91:84 PM 4.07 (H) 0.61 - 1.24 mg/dL Final  91/71/7974 90:79 AM 7.02 (H) 0.61 - 1.24 mg/dL Final  91/72/7974 90:42 PM 6.70 (H) 0.61 - 1.24 mg/dL Final  91/77/7974 87:84 PM 6.33 (H) 0.61 - 1.24 mg/dL Final  91/92/7974 95:61 AM 7.58 (H) 0.61 - 1.24 mg/dL Final  93/84/7974 91:99 AM 6.34 (H) 0.61 - 1.24 mg/dL Final  93/88/7974 92:72 PM 7.12 (H) 0.61 - 1.24 mg/dL Final  93/96/7974 96:65 PM 8.27 (H) 0.61 - 1.24 mg/dL Final  94/97/7974 96:56 AM 10.11 (H) 0.61 - 1.24 mg/dL Final  95/69/7974 97:82 AM 11.60 (H) 0.61 - 1.24 mg/dL Final  95/69/7974 97:85 AM 11.37 (H) 0.61 - 1.24 mg/dL Final  95/71/7974 96:61 PM 10.39 (H) 0.61 - 1.24 mg/dL Final  95/71/7974 93:47 AM 10.50 (H) 0.61 - 1.24 mg/dL Final  95/71/7974 93:52 AM 9.93 (H) 0.61 - 1.24 mg/dL Final  95/90/7974 95:79 AM 9.46 (H) 0.61 - 1.24 mg/dL Final  95/91/7974 95:71 AM 8.45 (H) 0.61 - 1.24 mg/dL Final  95/92/7974 95:75 AM 7.16 (H) 0.61 - 1.24 mg/dL Final  95/93/7974 88:69 AM 10.20 (H) 0.61 - 1.24 mg/dL Final  95/93/7974 96:81 AM 10.03 (H) 0.61 - 1.24 mg/dL Final  96/89/7974 92:72 AM 10.10 (H) 0.61 - 1.24 mg/dL Final  98/75/7974 96:60 AM 10.64 (H)  0.61 - 1.24 mg/dL Final  98/80/7974 94:96 PM 10.60 (H) 0.61 - 1.24 mg/dL Final  98/82/7974 94:46 AM 6.40 (H) 0.61 - 1.24 mg/dL Final  87/93/7975 90:52 AM 9.10 (H) 0.61 - 1.24 mg/dL Final  88/70/7975 92:89 AM 9.30 (H) 0.61 - 1.24 mg/dL Final  88/72/7975 96:41 AM 9.50 (H) 0.61 - 1.24 mg/dL Final  88/72/7975 96:82 AM 9.39 (H) 0.61 - 1.24 mg/dL Final  88/93/7975 91:64 AM 10.10 (H) 0.61 - 1.24 mg/dL Final  89/69/7975 98:55 PM 5.88 (H) 0.61 - 1.24 mg/dL Final  89/89/7975 89:55 AM 11.37 (H) 0.61 - 1.24 mg/dL Final  91/91/7975 87:75 AM 9.30 (H) 0.61 - 1.24 mg/dL Final  91/92/7975 88:99 PM 9.40 (H) 0.61 - 1.24 mg/dL Final  91/92/7975 89:44 PM 8.14 (H) 0.61 - 1.24 mg/dL Final  91/96/7975 94:61 AM 8.34 (H) 0.61 - 1.24 mg/dL Final  91/97/7975  90:58 PM 8.80 (H) 0.61 - 1.24 mg/dL Final  91/97/7975 91:46 PM 7.83 (H) 0.61 - 1.24 mg/dL Final  92/74/7975 92:58 AM 6.63 (H) 0.61 - 1.24 mg/dL Final  92/89/7975 89:99 AM 7.80 (H) 0.61 - 1.24 mg/dL Final  94/88/7975 96:98 AM 7.31 (H) 0.61 - 1.24 mg/dL Final  94/89/7975 95:92 AM 4.99 (H) 0.61 - 1.24 mg/dL Final  94/90/7975 97:49 PM 6.68 (H) 0.61 - 1.24 mg/dL Final  94/90/7975 89:58 AM 6.31 (H) 0.61 - 1.24 mg/dL Final  94/91/7975 94:46 AM 6.42 (H) 0.61 - 1.24 mg/dL Final  94/92/7975 88:62 PM 5.38 (H) 0.61 - 1.24 mg/dL Final  95/71/7975 98:54 AM 5.44 (H) 0.61 - 1.24 mg/dL Final  95/72/7975 91:40 PM 5.15 (H) 0.61 - 1.24 mg/dL Final  95/80/7975 91:94 AM 6.80 (H) 0.61 - 1.24 mg/dL Final  97/88/7975 95:85 AM 5.59 (H) 0.61 - 1.24 mg/dL Final  98/89/7975 91:74 AM 9.10 (H) 0.61 - 1.24 mg/dL Final  87/73/7976 98:90 PM 7.46 (H) 0.61 - 1.24 mg/dL Final   Recent Labs  Lab 04/06/24 2157 04/07/24 0920 04/07/24 2015 04/08/24 0127 04/09/24 0345  NA 133* 136 133* 134* 132*  K 5.9* 6.6* 4.9 4.8 5.5*  CL 95* 93* 94* 95* 93*  CO2 22 21* 21* 22 23  GLUCOSE 108* 91 52* 81 98  BUN 77* 84* 39* 44* 62*  CREATININE 6.70* 7.02* 4.07* 4.62* 5.68*  CALCIUM  8.9 9.4 9.1 8.9 9.5  PHOS  --   --  4.9* 6.0* 7.4*   Recent Labs  Lab 04/06/24 2157 04/07/24 0920 04/08/24 0127 04/09/24 0345  WBC 5.3 3.8* 4.2 5.5  NEUTROABS  --   --  3.6  --   HGB 13.3 14.5 13.8 12.5*  HCT 40.1 43.9 40.3 36.9*  MCV 80.4 81.3 78.3* 78.0*  PLT 107* 112* 135* 129*   Liver Function Tests: Recent Labs  Lab 04/07/24 1410 04/07/24 2015 04/08/24 0127 04/09/24 0345  AST 25  --   --   --   ALT 25  --   --   --   ALKPHOS 113  --   --   --   BILITOT 0.7  --   --   --   PROT 6.5  --   --   --   ALBUMIN  2.8* 2.6* 2.4* 2.4*   No results for input(s): LIPASE, AMYLASE in the last 168 hours.  No results for input(s): AMMONIA in the last 168 hours. Cardiac Enzymes: No results for input(s): CKTOTAL, CKMB, CKMBINDEX,  TROPONINI in the last 168 hours. Iron Studies: No results for input(s): IRON, TIBC, TRANSFERRIN, FERRITIN in the last  72 hours. PT/INR: @LABRCNTIP (inr:5)  Xrays/Other Studies: ) Results for orders placed or performed during the hospital encounter of 04/07/24 (from the past 48 hours)  CBG monitoring, ED     Status: None   Collection Time: 04/07/24 11:40 AM  Result Value Ref Range   Glucose-Capillary 90 70 - 99 mg/dL    Comment: Glucose reference range applies only to samples taken after fasting for at least 8 hours.   Comment 1 Document in Chart   CBG monitoring, ED     Status: Abnormal   Collection Time: 04/07/24 12:36 PM  Result Value Ref Range   Glucose-Capillary 141 (H) 70 - 99 mg/dL    Comment: Glucose reference range applies only to samples taken after fasting for at least 8 hours.   Comment 1 Document in Chart   Hepatitis B surface antigen     Status: None   Collection Time: 04/07/24  2:10 PM  Result Value Ref Range   Hepatitis B Surface Ag NON REACTIVE NON REACTIVE    Comment: Performed at [facility], [address] [place], [place] [address]  Hepatitis B surface antibody,quantitative     Status: Abnormal   Collection Time: 04/07/24  2:10 PM  Result Value Ref Range   Hep B S AB Quant (Post) <3.5 (L) Immunity>10 mIU/mL    Comment: (NOTE)  Status of Immunity                     Anti-HBs Level  ------------------                     -------------- Inconsistent with Immunity                  0.0 - 10.0 Consistent with Immunity                         >10.0 Performed At: [facility] [address] [place], [place] [id number] Jennette Shorter MD Ey:[phone]   Hepatic function panel     Status: Abnormal   Collection Time: 04/07/24  2:10 PM  Result Value Ref Range   Total Protein 6.5 6.5 - 8.1 g/dL   Albumin  2.8 (L) 3.5 - 5.0 g/dL   AST 25 15 - 41 U/L   ALT 25 0 - 44 U/L   Alkaline Phosphatase 113 38 - 126 U/L   Total Bilirubin 0.7 0.0 - 1.2  mg/dL   Bilirubin, Direct 0.2 0.0 - 0.2 mg/dL   Indirect Bilirubin 0.5 0.3 - 0.9 mg/dL    Comment: Performed at [facility], [address], [place], [place] [address]  Renal function panel     Status: Abnormal   Collection Time: 04/07/24  8:15 PM  Result Value Ref Range   Sodium 133 (L) 135 - 145 mmol/L   Potassium 4.9 3.5 - 5.1 mmol/L    Comment: HEMOLYSIS AT THIS LEVEL MAY AFFECT RESULT DELTA CHECK NOTED    Chloride 94 (L) 98 - 111 mmol/L   CO2 21 (L) 22 - 32 mmol/L   Glucose, Bld 52 (L) 70 - 99 mg/dL    Comment: Glucose reference range applies only to samples taken after fasting for at least 8 hours.   BUN 39 (H) 8 - 23 mg/dL   Creatinine, Ser 5.92 (H) 0.61 - 1.24 mg/dL   Calcium  9.1 8.9 - 10.3 mg/dL   Phosphorus 4.9 (H) 2.5 - 4.6 mg/dL   Albumin   2.6 (L) 3.5 - 5.0 g/dL   GFR, Estimated 15 (L) >60 mL/min    Comment: (NOTE) Calculated using the CKD-EPI Creatinine Equation (2021)    Anion gap 18 (H) 5 - 15    Comment: Performed at [facility], [address] [place], [place] [address]  Glucose, capillary     Status: Abnormal   Collection Time: 04/07/24  9:40 PM  Result Value Ref Range   Glucose-Capillary 44 (LL) 70 - 99 mg/dL    Comment: Glucose reference range applies only to samples taken after fasting for at least 8 hours.   Comment 1 Notify RN   Glucose, capillary     Status: Abnormal   Collection Time: 04/07/24 10:17 PM  Result Value Ref Range   Glucose-Capillary 48 (L) 70 - 99 mg/dL    Comment: Glucose reference range applies only to samples taken after fasting for at least 8 hours.  Glucose, capillary     Status: None   Collection Time: 04/07/24 11:30 PM  Result Value Ref Range   Glucose-Capillary 93 70 - 99 mg/dL    Comment: Glucose reference range applies only to samples taken after fasting for at least 8 hours.  Glucose, capillary     Status: None   Collection Time: 04/08/24 12:59 AM  Result Value Ref Range   Glucose-Capillary 91 70 - 99 mg/dL     Comment: Glucose reference range applies only to samples taken after fasting for at least 8 hours.  Renal function panel     Status: Abnormal   Collection Time: 04/08/24  1:27 AM  Result Value Ref Range   Sodium 134 (L) 135 - 145 mmol/L   Potassium 4.8 3.5 - 5.1 mmol/L   Chloride 95 (L) 98 - 111 mmol/L   CO2 22 22 - 32 mmol/L   Glucose, Bld 81 70 - 99 mg/dL    Comment: Glucose reference range applies only to samples taken after fasting for at least 8 hours.   BUN 44 (H) 8 - 23 mg/dL   Creatinine, Ser 5.37 (H) 0.61 - 1.24 mg/dL   Calcium  8.9 8.9 - 10.3 mg/dL   Phosphorus 6.0 (H) 2.5 - 4.6 mg/dL   Albumin  2.4 (L) 3.5 - 5.0 g/dL   GFR, Estimated 13 (L) >60 mL/min    Comment: (NOTE) Calculated using the CKD-EPI Creatinine Equation (2021)    Anion gap 17 (H) 5 - 15    Comment: Performed at [facility], [address] [place], [place] [address]  CBC with Differential/Platelet     Status: Abnormal   Collection Time: 04/08/24  1:27 AM  Result Value Ref Range   WBC 4.2 4.0 - 10.5 K/uL   RBC 5.15 4.22 - 5.81 MIL/uL   Hemoglobin 13.8 13.0 - 17.0 g/dL   HCT 59.6 60.9 - 47.9 %   MCV 78.3 (L) 80.0 - 100.0 fL   MCH 26.8 26.0 - 34.0 pg   MCHC 34.2 30.0 - 36.0 g/dL   RDW 82.7 (H) 88.4 - 84.4 %   Platelets 135 (L) 150 - 400 K/uL    Comment: REPEATED TO VERIFY   nRBC 0.0 0.0 - 0.2 %   Neutrophils Relative % 86 %   Neutro Abs 3.6 1.7 - 7.7 K/uL   Lymphocytes Relative 4 %   Lymphs Abs 0.2 (L) 0.7 - 4.0 K/uL   Monocytes Relative 9 %   Monocytes Absolute 0.4 0.1 - 1.0 K/uL   Eosinophils Relative 0 %  Eosinophils Absolute 0.0 0.0 - 0.5 K/uL   Basophils Relative 0 %   Basophils Absolute 0.0 0.0 - 0.1 K/uL   WBC Morphology See Note     Comment: INCREASED BANDS (>20% BANDS)   Smear Review Normal platelet morphology    Immature Granulocytes 1 %   Abs Immature Granulocytes 0.02 0.00 - 0.07 K/uL   Ovalocytes PRESENT     Comment: Performed at [facility], [address]  [place], [place] [address]  TSH     Status: None   Collection Time: 04/08/24  1:27 AM  Result Value Ref Range   TSH 1.445 0.350 - 4.500 uIU/mL    Comment: Performed by a 3rd Generation assay with a functional sensitivity of <=0.01 uIU/mL. Performed at [facility], [address] [place], [place] [address]   Glucose, capillary     Status: None   Collection Time: 04/08/24  4:03 AM  Result Value Ref Range   Glucose-Capillary 72 70 - 99 mg/dL    Comment: Glucose reference range applies only to samples taken after fasting for at least 8 hours.  Glucose, capillary     Status: None   Collection Time: 04/08/24  8:13 AM  Result Value Ref Range   Glucose-Capillary 73 70 - 99 mg/dL    Comment: Glucose reference range applies only to samples taken after fasting for at least 8 hours.   Comment 1 Notify RN   Glucose, capillary     Status: Abnormal   Collection Time: 04/08/24 12:11 PM  Result Value Ref Range   Glucose-Capillary 66 (L) 70 - 99 mg/dL    Comment: Glucose reference range applies only to samples taken after fasting for at least 8 hours.  Glucose, capillary     Status: Abnormal   Collection Time: 04/08/24  2:21 PM  Result Value Ref Range   Glucose-Capillary 107 (H) 70 - 99 mg/dL    Comment: Glucose reference range applies only to samples taken after fasting for at least 8 hours.  Glucose, capillary     Status: Abnormal   Collection Time: 04/08/24  3:00 PM  Result Value Ref Range   Glucose-Capillary 66 (L) 70 - 99 mg/dL    Comment: Glucose reference range applies only to samples taken after fasting for at least 8 hours.   Comment 1 Notify RN    Comment 2 Document in Chart   Glucose, capillary     Status: Abnormal   Collection Time: 04/08/24  4:55 PM  Result Value Ref Range   Glucose-Capillary 159 (H) 70 - 99 mg/dL    Comment: Glucose reference range applies only to samples taken after fasting for at least 8 hours.  Glucose, capillary     Status: Abnormal   Collection Time:  04/08/24  8:29 PM  Result Value Ref Range   Glucose-Capillary 114 (H) 70 - 99 mg/dL    Comment: Glucose reference range applies only to samples taken after fasting for at least 8 hours.  Glucose, capillary     Status: Abnormal   Collection Time: 04/09/24 12:08 AM  Result Value Ref Range   Glucose-Capillary 115 (H) 70 - 99 mg/dL    Comment: Glucose reference range applies only to samples taken after fasting for at least 8 hours.   Comment 1 Notify RN    Comment 2 Document in Chart   Renal function panel     Status: Abnormal   Collection Time: 04/09/24  3:45 AM  Result Value Ref  Range   Sodium 132 (L) 135 - 145 mmol/L   Potassium 5.5 (H) 3.5 - 5.1 mmol/L   Chloride 93 (L) 98 - 111 mmol/L   CO2 23 22 - 32 mmol/L   Glucose, Bld 98 70 - 99 mg/dL    Comment: Glucose reference range applies only to samples taken after fasting for at least 8 hours.   BUN 62 (H) 8 - 23 mg/dL   Creatinine, Ser 4.31 (H) 0.61 - 1.24 mg/dL   Calcium  9.5 8.9 - 10.3 mg/dL   Phosphorus 7.4 (H) 2.5 - 4.6 mg/dL   Albumin  2.4 (L) 3.5 - 5.0 g/dL   GFR, Estimated 10 (L) >60 mL/min    Comment: (NOTE) Calculated using the CKD-EPI Creatinine Equation (2021)    Anion gap 16 (H) 5 - 15    Comment: Performed at [facility], [address], [place], [place] [address]  CBC     Status: Abnormal   Collection Time: 04/09/24  3:45 AM  Result Value Ref Range   WBC 5.5 4.0 - 10.5 K/uL   RBC 4.73 4.22 - 5.81 MIL/uL   Hemoglobin 12.5 (L) 13.0 - 17.0 g/dL   HCT 63.0 (L) 60.9 - 47.9 %   MCV 78.0 (L) 80.0 - 100.0 fL   MCH 26.4 26.0 - 34.0 pg   MCHC 33.9 30.0 - 36.0 g/dL   RDW 83.0 (H) 88.4 - 84.4 %   Platelets 129 (L) 150 - 400 K/uL   nRBC 0.0 0.0 - 0.2 %    Comment: Performed at [facility], [address], [place], [place] [address]  Glucose, capillary     Status: Abnormal   Collection Time: 04/09/24  4:18 AM  Result Value Ref Range   Glucose-Capillary 107 (H) 70 - 99 mg/dL    Comment: Glucose reference  range applies only to samples taken after fasting for at least 8 hours.   Comment 1 Notify RN    Comment 2 Document in Chart   Glucose, capillary     Status: None   Collection Time: 04/09/24  7:41 AM  Result Value Ref Range   Glucose-Capillary 96 70 - 99 mg/dL    Comment: Glucose reference range applies only to samples taken after fasting for at least 8 hours.   US  ABDOMEN LIMITED WITH LIVER DOPPLER Result Date: 04/08/2024 CLINICAL DATA:  Ascites. EXAM: DUPLEX ULTRASOUND OF LIVER TECHNIQUE: Color and duplex Doppler ultrasound was attempted to evaluate the hepatic in-flow and out-flow vessels. Patient terminated the exam prior to completion. COMPARISON:  None Available. FINDINGS: Liver: Not well assessed on the current exam. Main Portal Vein size: Not assessed on the current exam. Portal Vein Velocities Patient terminated the exam prior to assessment. Hepatic Vein Velocities Right:  53 cm/sec Middle:  36.8 cm/sec Left: Not assessed, patient terminated the exam prior to assessment. IVC: Not assessed, patient terminated the exam prior to assessment. Hepatic Artery Velocity: Not assessed, patient terminated the exam prior to assessment. Splenic Vein Velocity: Not assessed, patient terminated the exam prior to assessment. Spleen: Not assessed. Portal Vein Occlusion/Thrombus: Not assessed. Splenic Vein Occlusion/Thrombus: Not assessed. Ascites: Present in the right upper quadrant. Varices: Not assessed. IMPRESSION: 1. Right upper quadrant ascites. 2. Patent right and middle hepatic veins. Patient terminated the exam prior to interrogation of the remaining vasculature. Electronically Signed   By: Andrea Gasman M.D.   On: 04/08/2024 21:30   DG Chest Portable 1 View Result Date: 04/07/2024 EXAM: 1 VIEW XRAY  OF THE CHEST 04/07/2024 11:04:35 AM COMPARISON: 04/06/2024 CLINICAL HISTORY: SOB, dialysis. Per chart - Pt here for a unwitnessed fall. Pt unsure if he hit his head. Denies LOC. No lacerations or abrasions  noted to head. Pt is on eliquis  and plavix. Axox4. Pts last full dialysis treatment was yesterday per pt. Pt states he did cocaine  last night. FINDINGS: LUNGS AND PLEURA: Left basilar airspace opacities are improving. No pleural effusion. No pneumothorax. HEART AND MEDIASTINUM: The heart is enlarged. Atherosclerotic changes are again noted at the aortic arch. BONES AND SOFT TISSUES: No acute osseous abnormality. LINES AND TUBES: A right IJ dialysis catheter is stable and in appropriate position. IMPRESSION: 1. Improving left basilar airspace opacities. 2. Enlarged heart. 3. Atherosclerotic changes at the aortic arch. Electronically signed by: [name] Necessary MD 04/07/2024 11:13 AM EDT RP Workstation: [id number]   CT Head Wo Contrast Result Date: 04/07/2024 CLINICAL DATA:  Head trauma, minor (Age >= 65y) EXAM: CT HEAD WITHOUT CONTRAST TECHNIQUE: Contiguous axial images were obtained from the base of the skull through the vertex without intravenous contrast. RADIATION DOSE REDUCTION: This exam was performed according to the departmental dose-optimization program which includes automated exposure control, adjustment of the mA and/or kV according to patient size and/or use of iterative reconstruction technique. COMPARISON:  None Available. FINDINGS: Brain: No evidence of acute infarction, hemorrhage, hydrocephalus, extra-axial collection or mass lesion/mass effect. Patchy white matter hypodensities, compatible with chronic microvascular ischemic disease. Vascular: Calcific atherosclerosis. Skull: No acute fracture. Sinuses/Orbits: Mostly clear sinuses.  No orbital fracture. IMPRESSION: No evidence of acute intracranial abnormality. Electronically Signed   By: Gilmore GORMAN Molt M.D.   On: 04/07/2024 09:53    PMH:   Past Medical History:  Diagnosis Date   Anemia    Anxiety    Arthritis    Back pain    Bronchitis    COPD (chronic obstructive pulmonary disease) (HCC)    Coronary artery disease    COVID     mild - flu like symptoms   Dyspnea    w/ exertion, uses inhaler   ESRD on hemodialysis (HCC)    dialysis on tues, thurs, sat at [facility]   GERD (gastroesophageal reflux disease)    not a current problem   Heart murmur    never has caused any problems   Hypertension    Myocardial infarction (HCC)    Pneumonia    x 1   Pre-diabetes    diet controlled, no meds, does not check blood sugar   Substance abuse (HCC)     PSH:   Past Surgical History:  Procedure Laterality Date   A/V FISTULAGRAM Right 06/17/2023   Procedure: A/V Fistulagram;  Surgeon: Lanis Fonda BRAVO, MD;  Location: [facility] INVASIVE CV LAB;  Service: Cardiovascular;  Laterality: Right;   A/V FISTULAGRAM Right 07/17/2023   Procedure: A/V Fistulagram;  Surgeon: Magda Debby SAILOR, MD;  Location: [facility] INVASIVE CV LAB;  Service: Cardiovascular;  Laterality: Right;   A/V SHUNT INTERVENTION N/A 12/10/2023   Procedure: A/V SHUNT INTERVENTION;  Surgeon: Tobie Gordy POUR, MD;  Location: [facility] INVASIVE CV LAB;  Service: Cardiovascular;  Laterality: N/A;   AV FISTULA PLACEMENT Left 04/12/2021   Procedure: LEFT ARM ARTERIOVENOUS (AV) FISTULA CREATION;  Surgeon: Gretta [name] PARAS, MD;  Location: [facility] OR;  Service: Vascular;  Laterality: Left;   AV FISTULA PLACEMENT Left 10/18/2021   Procedure: LEFT ARM BRACHIOBASILIC FISTULA CREATION FIRST STAGE;  Surgeon: Gretta [name] PARAS, MD;  Location: [facility] OR;  Service: Vascular;  Laterality:  Left;   AV FISTULA PLACEMENT Right 11/28/2022   Procedure: RIGHT ARM FIRST STAGE BRACHIOBASILIC FISTULA CREATION;  Surgeon: Gretta [name] PARAS, MD;  Location: [facility] OR;  Service: Vascular;  Laterality: Right;   AV FISTULA PLACEMENT Right 10/19/2023   Procedure: RIGHT ARM ARTERIOVENOUS GRAFT CREATION;  Surgeon: Magda Debby SAILOR, MD;  Location: [facility] OR;  Service: Vascular;  Laterality: Right;   BACK SURGERY     BASCILIC VEIN TRANSPOSITION Left 01/27/2022   Procedure: LEFT SECOND STAGE BASILIC VEIN TRANSPOSITION;  Surgeon: Gretta [name] PARAS, MD;  Location: [facility] OR;  Service: Vascular;  Laterality: Left;   BASCILIC VEIN TRANSPOSITION Right 02/18/2023   Procedure: SECOND STAGE RIGHT ARM BASILIC VEIN TRANSPOSITION;  Surgeon: Gretta [name] PARAS, MD;  Location: [facility] OR;  Service: Vascular;  Laterality: Right;   BIOPSY  08/20/2022   Procedure: BIOPSY;  Surgeon: San Sandor GAILS, DO;  Location: [facility];  Service: Gastroenterology;;   COLONOSCOPY WITH PROPOFOL  N/A 08/20/2022   Procedure: COLONOSCOPY WITH PROPOFOL ;  Surgeon: San Sandor GAILS, DO;  Location: [facility];  Service: Gastroenterology;  Laterality: N/A;   DIALYSIS/PERMA CATHETER INSERTION Right 03/05/2023   Procedure: DIALYSIS/PERMA CATHETER INSERTION;  Surgeon: Gretta [name] PARAS, MD;  Location: [facility] INVASIVE CV LAB;  Service: Cardiovascular;  Laterality: Right;   DIALYSIS/PERMA CATHETER INSERTION  12/10/2023   Procedure: DIALYSIS/PERMA CATHETER INSERTION;  Surgeon: Tobie Gordy POUR, MD;  Location: [facility] INVASIVE CV LAB;  Service: Cardiovascular;;   DIALYSIS/PERMA CATHETER INSERTION N/A 01/21/2024   Procedure: DIALYSIS/PERMA CATHETER INSERTION;  Surgeon: Serene Gaile ORN, MD;  Location: [facility];  Service: Cardiovascular;  Laterality: N/A;   ESOPHAGOGASTRODUODENOSCOPY (EGD) WITH PROPOFOL  N/A 08/20/2022   Procedure: ESOPHAGOGASTRODUODENOSCOPY (EGD) WITH PROPOFOL ;  Surgeon: San Sandor GAILS, DO;  Location: [facility];  Service: Gastroenterology;  Laterality: N/A;   INSERTION OF DIALYSIS CATHETER Right 05/01/2022   Procedure: INSERTION OF DIALYSIS CATHETER;  Surgeon: Magda Debby SAILOR, MD;  Location: [facility] OR;  Service: Vascular;  Laterality: Right;   INSERTION OF DIALYSIS CATHETER Right 07/10/2023   Procedure: ULTRASOUNDED GUIDED INSERTION OF TUNNELED  DIALYSIS CATHETER TO RIGHT FEMORAL VEIN;  Surgeon: Gretta [name] PARAS, MD;  Location: [facility] OR;  Service: Vascular;  Laterality: Right;   IR FLUORO GUIDE CV LINE RIGHT  12/07/2023   IR FLUORO GUIDE CV LINE RIGHT  12/09/2023    IR REMOVAL TUN CV CATH W/O FL  12/19/2022   IR REMOVAL TUN CV CATH W/O FL  11/18/2023   IR US  GUIDE VASC ACCESS RIGHT  12/07/2023   LIGATION OF COMPETING BRANCHES OF ARTERIOVENOUS FISTULA Left 07/15/2021   Procedure: LIGATION OF COMPETING BRANCHES OF LEFT RADIOCEPHALIC ARTERIOVENOUS FISTULA TIMES TWO;  Surgeon: Gretta [name] PARAS, MD;  Location: [facility] OR;  Service: Vascular;  Laterality: Left;   PERIPHERAL VASCULAR BALLOON ANGIOPLASTY  07/17/2023   Procedure: PERIPHERAL VASCULAR BALLOON ANGIOPLASTY;  Surgeon: Magda Debby SAILOR, MD;  Location: [facility];  Service: Cardiovascular;;   PERIPHERAL VASCULAR INTERVENTION  06/17/2023   Procedure: PERIPHERAL VASCULAR INTERVENTION;  Surgeon: Lanis Fonda BRAVO, MD;  Location: [facility] INVASIVE CV LAB;  Service: Cardiovascular;;   POLYPECTOMY  08/20/2022   Procedure: POLYPECTOMY;  Surgeon: San Sandor GAILS, DO;  Location: [facility];  Service: Gastroenterology;;   REVISON OF ARTERIOVENOUS FISTULA Left 07/15/2021   Procedure: REVISON OF LEFT ARTERIOVENOUS FISTULA;  Surgeon: Gretta [name] PARAS, MD;  Location: [facility] OR;  Service: Vascular;  Laterality: Left;   REVISON OF ARTERIOVENOUS FISTULA Left 05/01/2022   Procedure: ARTERIOVENOUS FISTULA WASHOUT OF ARM  HEMATOMA;  Surgeon: Magda Debby SAILOR, MD;  Location: [facility] OR;  Service: Vascular;  Laterality: Left;   TUNNELLED CATHETER EXCHANGE N/A 10/23/2023   Procedure: TUNNELLED CATHETER EXCHANGE;  Surgeon: Norine Manuelita LABOR, MD;  [facility];  Service: Cardiovascular;  Laterality: N/A;   UPPER EXTREMITY VENOGRAPHY Bilateral 08/28/2023   Procedure: UPPER EXTREMITY VENOGRAPHY;  Surgeon: Magda Debby SAILOR, MD;  [facility];  Service: Cardiovascular;  Laterality: Bilateral;    Allergies:  Allergies  Allergen Reactions   Enalapril  Other (See Comments)    Renal failure syndrome   Gabapentin      Encephalopathy, tremor   Iodinated Contrast Media Nausea And Vomiting    Medications:   Prior  to Admission medications   Medication Sig Start Date End Date Taking? Authorizing Provider  amLODipine  (NORVASC ) 10 MG tablet Take 1 tablet (10 mg total) by mouth daily. Patient taking differently: Take 10 mg by mouth daily. Hold for systolic BP <100 4/75/80  Yes Crenshaw, Redell RAMAN, MD  apixaban  (ELIQUIS ) 5 MG TABS tablet Take 1 tablet (5 mg total) by mouth 2 (two) times daily. 03/06/23  Yes Atway, Rayann N, DO  ARIPiprazole  (ABILIFY ) 5 MG tablet Take 1 tablet (5 mg total) by mouth daily. 11/20/23  Yes Ghimire, Donalda HERO, MD  B Complex-C-Folic Acid  (DIALYVITE  TABLET) TABS Take 1 tablet by mouth daily. 12/23/22  Yes [provider]  carvedilol  (COREG ) 12.5 MG tablet Take 12.5 mg by mouth 2 (two) times daily with a meal.   Yes [provider]  escitalopram  (LEXAPRO ) 10 MG tablet Take 5-10 mg by mouth See admin instructions. Take 5 mg by mouth daily for 10 days then increase to 10 mg once daily.   Yes [provider]  ferric citrate  (AURYXIA) 1 GM 210 MG(Fe) tablet Take 210 mg by mouth 3 (three) times daily with meals.   Yes [provider]  furosemide  (LASIX ) 40 MG tablet Take 40 mg by mouth daily.   Yes [provider]  HYDROcodone -acetaminophen  (NORCO) 10-325 MG tablet SMARTSIG:1 Tablet(s) By Mouth Every 8-12 Hours PRN 02/18/24  Yes [provider]  isosorbide  mononitrate (IMDUR ) 30 MG 24 hr tablet Take 30 mg by mouth daily.   Yes [provider]  mirtazapine  (REMERON ) 15 MG tablet Take 15 mg by mouth at bedtime.   Yes [provider]  mometasone [facility]) 220 MCG/ACT inhaler Inhale 2 puffs into the lungs daily.   Yes [provider]  PSYLLIUM PO Take 5 mLs by mouth daily. 09/03/23  Yes [provider]  rosuvastatin  (CRESTOR ) 10 MG tablet Take 10 mg by mouth at bedtime.   Yes [provider]  Tiotropium Bromide-Olodaterol 2.5-2.5 MCG/ACT AERS Take 2 puffs by mouth in the morning. 08/07/23  Yes [provider]  albuterol  (VENTOLIN  HFA) 108 (90 Base) MCG/ACT inhaler Inhale 2 puffs into the lungs every 6 (six) hours as needed (COPD).    [provider]  escitalopram  (LEXAPRO ) 20 MG tablet Take 0.5 tablets (10 mg total) by mouth at bedtime. 11/19/23   Ghimire, Donalda HERO, MD  hydrALAZINE  (APRESOLINE ) 25 MG tablet Take 25 mg by mouth 2 (two) times daily.    [provider]  lidocaine  (LIDODERM ) 5 % Place 2 patches onto the skin daily. Remove & Discard patch within 12 hours or as directed by MD    [provider]  naloxone  (NARCAN ) nasal spray 4 mg/0.1 mL Place 0.4 mg into the nose See admin instructions.  SPRAY 1 SPRAY INTO ONE NOSTRIL AS DIRECTED AS NEEDED FOR RESCUE FOR OPIOID OVERDOSE - CALL 911 IMMEDIATELY, ADMINISTER DOSE, THEN TURN PERSON ON SIDE - IF NO RESPONSE IN 2-3 MINUTES OR PERSON RESPONDS BUT RELAPSES, REPEAT USING A NEW SPRAY DEVICE AND SPRAY INTO THE OTHER NOSTRIL AS NEEDED FOR RESCUE 01/21/23   [provider]  omeprazole  (PRILOSEC) 40 MG capsule Take 1 capsule (40 mg total) by mouth daily. Patient taking differently: Take 40 mg by mouth daily as needed. 06/06/22   Kennedy-Smith, Colleen M, NP  ondansetron  (ZOFRAN -ODT) 8 MG disintegrating tablet Take 8 mg by mouth every 6 (six) hours. 11/06/23   [provider]  oxyCODONE -acetaminophen  (PERCOCET/ROXICET) 5-325 MG tablet Take 1 tablet by mouth every 4 (four) hours as needed for severe pain (pain score 7-10). 04/06/24   Patsey Lot, MD  sevelamer  carbonate (RENVELA ) 800 MG tablet Take 1,600 mg by mouth 3 (three) times daily with meals. 01/06/23   [provider]  sorbitol 70 % solution Take 30 mLs by mouth 3 (three) times daily as needed (constipation). 01/20/23   [provider]  tamsulosin  (FLOMAX ) 0.4 MG CAPS capsule Take 0.4 mg by mouth at bedtime.    [provider]    Discontinued Meds:   Medications Discontinued During This Encounter  Medication Reason    albuterol  (VENTOLIN  HFA) 108 (90 Base) MCG/ACT inhaler 2 puff P&T Policy: Therapeutic Substitute   apixaban  (ELIQUIS ) tablet 5 mg    pentafluoroprop-tetrafluoroeth (GEBAUERS) aerosol 1 Application Patient Transfer   lidocaine  (PF) (XYLOCAINE ) 1 % injection 5 mL Patient Transfer   lidocaine -prilocaine  (EMLA ) cream 1 Application Patient Transfer   heparin  injection 1,000 Units Patient Transfer   anticoagulant sodium citrate  solution 5 mL Patient Transfer   alteplase  (CATHFLO ACTIVASE ) injection 2 mg Patient Transfer   heparin  injection 1,400 Units Patient Transfer   dextrose  5 % solution    heparin  injection 6,000 Units     Social History:  reports that he has been smoking cigars and cigarettes. He has been exposed to tobacco smoke. He has never used smokeless tobacco. He reports that he does not currently use alcohol after a past usage of about 14.0 standard drinks of alcohol per week. He reports current drug use. Frequency: 1.00 time per week. Drugs: Marijuana and Cocaine .  Family History:   Family History  Problem Relation Age of Onset   Heart disease Mother    Heart attack Sister 8    Blood pressure (!) 110/94, pulse 72, temperature 97.6 F (36.4 C), resp. rate (!) 26, height 5' 11 (1.803 m), weight 60.7 kg, SpO2 (!) 89%. Physical Exam: General: Arousable and tries to answer questions; I wonder if he is just being difficult  Head: NCAT sclera not icteric MMM Neck: Supple. No JVD appreciated  Lungs: Clear bilaterally, no iwob Heart: RRR, no murmur,  Abdomen: soft mild tenderness but no rebound  Lower extremities:  tr-1+ pitting LE edema bilat  Dialysis Access: R chest TDC      Lakiyah Arntson, LYNWOOD ORN, MD 04/09/2024, 9:27 AM

## 2024-04-09 NOTE — Plan of Care (Signed)
   Problem: Education: Goal: Knowledge of General Education information will improve Description: Including pain rating scale, medication(s)/side effects and non-pharmacologic comfort measures Outcome: Not Progressing   Problem: Health Behavior/Discharge Planning: Goal: Ability to manage health-related needs will improve Outcome: Not Progressing   Problem: Clinical Measurements: Goal: Ability to maintain clinical measurements within normal limits will improve Outcome: Not Progressing Goal: Will remain free from infection Outcome: Not Progressing Goal: Diagnostic test results will improve Outcome: Not Progressing Goal: Respiratory complications will improve Outcome: Not Progressing Goal: Cardiovascular complication will be avoided Outcome: Not Progressing   Problem: Activity: Goal: Risk for activity intolerance will decrease Outcome: Not Progressing   Problem: Nutrition: Goal: Adequate nutrition will be maintained Outcome: Not Progressing   Problem: Coping: Goal: Level of anxiety will decrease Outcome: Not Progressing   Problem: Elimination: Goal: Will not experience complications related to bowel motility Outcome: Not Progressing Goal: Will not experience complications related to urinary retention Outcome: Not Progressing   Problem: Pain Managment: Goal: General experience of comfort will improve and/or be controlled Outcome: Not Progressing   Problem: Safety: Goal: Ability to remain free from injury will improve Outcome: Not Progressing   Problem: Skin Integrity: Goal: Risk for impaired skin integrity will decrease Outcome: Not Progressing   Problem: Safety: Goal: Non-violent Restraint(s) Outcome: Not Progressing

## 2024-04-09 NOTE — Progress Notes (Signed)
 Received patient in bed to unit.  Alert and oriented.  Informed consent signed and in chart.   TX duration: 4 hours Patient tolerated well.  Transported back to the room  Alert, without acute distress.  Hand-off given to patient's nurse.   Access used:  R internal jugular HD Cath Access issues: none  Total UF removed: 3L Medication(s) given: none   04/09/24 1308  Vitals  BP 101/80  Pulse Rate 82  Resp 19  Weight 57.7 kg  Type of Weight Post-Dialysis  Oxygen Therapy  SpO2 99 %  O2 Device Room Air  Patient Activity (if Appropriate) In bed  Pulse Oximetry Type Continuous  During Treatment Monitoring  Blood Flow Rate (mL/min) 399 mL/min  Arterial Pressure (mmHg) -243.22 mmHg  Venous Pressure (mmHg) 203.22 mmHg  TMP (mmHg) 0 mmHg  Ultrafiltration Rate (mL/min) 872 mL/min  Dialysate Flow Rate (mL/min) 300 ml/min  Duration of HD Treatment -hour(s) 4 hour(s)  Cumulative Fluid Removed (mL) per Treatment  3000.27  HD Safety Checks Performed Yes  Intra-Hemodialysis Comments Tx completed;Tolerated well  Post Treatment  Dialyzer Clearance Clear  Liters Processed 96  Fluid Removed (mL) 3000 mL  Tolerated HD Treatment Yes  Hemodialysis Catheter Right Internal jugular Double lumen Permanent (Tunneled)  Placement Date/Time: 01/21/24 0853   Serial / Lot #: [id number]  Expiration Date: 07/13/28  Time Out: Correct patient;Correct site;Correct procedure  Maximum sterile barrier precautions: Hand hygiene;Cap;Mask;Sterile gown;Sterile gloves;Large sterile ...  Site Condition No complications  Blue Lumen Status Flushed;Dead end cap in place;Heparin  locked  Red Lumen Status Flushed;Dead end cap in place;Heparin  locked  Purple Lumen Status N/A  Catheter fill solution Heparin  1000 units/ml  Catheter fill volume (Arterial) 1.9 cc  Catheter fill volume (Venous) 1.9  Dressing Type Transparent  Dressing Status Antimicrobial disc/dressing in place;Clean, Dry, Intact  Drainage Description None   Dressing Change Due 04/15/24  Post treatment catheter status Capped and Clamped     Camellia Brasil LPN [facility]

## 2024-04-10 ENCOUNTER — Inpatient Hospital Stay (HOSPITAL_COMMUNITY)

## 2024-04-10 DIAGNOSIS — R197 Diarrhea, unspecified: Secondary | ICD-10-CM

## 2024-04-10 DIAGNOSIS — N186 End stage renal disease: Secondary | ICD-10-CM

## 2024-04-10 DIAGNOSIS — F1420 Cocaine dependence, uncomplicated: Secondary | ICD-10-CM

## 2024-04-10 DIAGNOSIS — I5020 Unspecified systolic (congestive) heart failure: Secondary | ICD-10-CM

## 2024-04-10 DIAGNOSIS — I130 Hypertensive heart and chronic kidney disease with heart failure and stage 1 through stage 4 chronic kidney disease, or unspecified chronic kidney disease: Secondary | ICD-10-CM

## 2024-04-10 DIAGNOSIS — Z992 Dependence on renal dialysis: Secondary | ICD-10-CM

## 2024-04-10 DIAGNOSIS — R188 Other ascites: Secondary | ICD-10-CM

## 2024-04-10 DIAGNOSIS — Z79899 Other long term (current) drug therapy: Secondary | ICD-10-CM

## 2024-04-10 DIAGNOSIS — D696 Thrombocytopenia, unspecified: Secondary | ICD-10-CM

## 2024-04-10 DIAGNOSIS — I480 Paroxysmal atrial fibrillation: Secondary | ICD-10-CM

## 2024-04-10 DIAGNOSIS — F1011 Alcohol abuse, in remission: Secondary | ICD-10-CM

## 2024-04-10 LAB — CBC WITH DIFFERENTIAL/PLATELET
Abs Immature Granulocytes: 0.04 K/uL (ref 0.00–0.07)
Basophils Absolute: 0 K/uL (ref 0.0–0.1)
Basophils Relative: 0 %
Eosinophils Absolute: 0 K/uL (ref 0.0–0.5)
Eosinophils Relative: 0 %
HCT: 38.8 % — ABNORMAL LOW (ref 39.0–52.0)
Hemoglobin: 13.1 g/dL (ref 13.0–17.0)
Immature Granulocytes: 1 %
Lymphocytes Relative: 6 %
Lymphs Abs: 0.3 K/uL — ABNORMAL LOW (ref 0.7–4.0)
MCH: 26.4 pg (ref 26.0–34.0)
MCHC: 33.8 g/dL (ref 30.0–36.0)
MCV: 78.1 fL — ABNORMAL LOW (ref 80.0–100.0)
Monocytes Absolute: 0.6 K/uL (ref 0.1–1.0)
Monocytes Relative: 10 %
Neutro Abs: 4.4 K/uL (ref 1.7–7.7)
Neutrophils Relative %: 83 %
Platelets: 122 K/uL — ABNORMAL LOW (ref 150–400)
RBC: 4.97 MIL/uL (ref 4.22–5.81)
RDW: 17.1 % — ABNORMAL HIGH (ref 11.5–15.5)
WBC: 5.3 K/uL (ref 4.0–10.5)
nRBC: 0 % (ref 0.0–0.2)

## 2024-04-10 LAB — BASIC METABOLIC PANEL WITH GFR
Anion gap: 15 (ref 5–15)
BUN: 46 mg/dL — ABNORMAL HIGH (ref 8–23)
CO2: 27 mmol/L (ref 22–32)
Calcium: 9.3 mg/dL (ref 8.9–10.3)
Chloride: 92 mmol/L — ABNORMAL LOW (ref 98–111)
Creatinine, Ser: 4.33 mg/dL — ABNORMAL HIGH (ref 0.61–1.24)
GFR, Estimated: 14 mL/min — ABNORMAL LOW (ref >60)
Glucose, Bld: 134 mg/dL — ABNORMAL HIGH (ref 70–99)
Potassium: 4.4 mmol/L (ref 3.5–5.1)
Sodium: 134 mmol/L — ABNORMAL LOW (ref 135–145)

## 2024-04-10 LAB — GLUCOSE, CAPILLARY: Glucose-Capillary: 128 mg/dL — ABNORMAL HIGH (ref 70–99)

## 2024-04-10 MED ORDER — ONDANSETRON HCL 4 MG/2ML IJ SOLN
4.0000 mg | Freq: Once | INTRAMUSCULAR | Status: AC
  Administered 2024-04-10: 4 mg via INTRAVENOUS
  Filled 2024-04-10: qty 2

## 2024-04-10 MED ORDER — CINACALCET HCL 30 MG PO TABS
30.0000 mg | ORAL_TABLET | ORAL | Status: DC
  Administered 2024-04-12 – 2024-04-14 (×2): 30 mg via ORAL
  Filled 2024-04-10 (×2): qty 1

## 2024-04-10 MED ORDER — SEVELAMER CARBONATE 800 MG PO TABS
1600.0000 mg | ORAL_TABLET | Freq: Three times a day (TID) | ORAL | Status: DC
  Administered 2024-04-10 – 2024-04-15 (×12): 1600 mg via ORAL
  Filled 2024-04-10 (×10): qty 2

## 2024-04-10 MED ORDER — LACTULOSE 10 GM/15ML PO SOLN
20.0000 g | ORAL | Status: DC
  Administered 2024-04-10: 20 g via ORAL
  Filled 2024-04-10: qty 30

## 2024-04-10 NOTE — Progress Notes (Signed)
                 Interval history Development of watery diarrhea per nurse. Patient denies abdominal pain. He's sleepy this morning. Apparently since yesterday he has changed mind about SNF for subacute rehab and consented to this.  Physical exam Blood pressure 123/82, pulse 88, temperature 97.6 F (36.4 C), temperature source Oral, resp. rate 19, height 5' 11 (1.803 m), weight 57.7 kg, SpO2 100%.  No distress, somnolent Heart rate and rhythm regular Breathing comfortably on room air Abdomen non-tender, tympanic to percussion Alert to verbal, drowsy, speech unchanged, pupils equal and reactive, symmetric palatal elevation, tongue is midline, oriented to [facility] hospital, able to tell me day of week when prompted that yesterday was Saturday  Assessment and plan Hospital day 2  Ronald Reeves is a 68 y.o. admitted for toxic metabolic encephalopathy with waxing and waning mental status, pending SNF placement for subacute rehab.  Principal Problem:   Acute metabolic encephalopathy Somewhat worsened today.  Nonfocal neurologic exam.  Attention is good, he is a little more disoriented than yesterday.  This could be hospital delirium.  Doubt this represents septic encephalopathy or new hepatic encephalopathy.  He also did not sleep well last night.  Will check on him later this afternoon.  Active Problems:   Diarrhea Acute, developed overnight.  Watery stool per nurse.  No abdominal pain.  Check CBC for leukocytosis.  Suspicion for C. difficile is low at this time.  He has not had any antibiotics since hospitalization.    Essential hypertension Chronic and stable.  Normotensive.  Holding outpatient antihypertensives.    Alcohol use disorder, moderate, in sustained remission (HCC) Chronic and stable.  No alcohol withdrawal.    Cocaine  use disorder, severe, dependence (HCC) Chronic, severe.  Need to double check that he is not using surreptitiously in the hospital.    ESRD (end stage renal  disease) on dialysis TTS schedule (HCC) Stable.  HD via right tunneled internal jugular catheter.  Regularly scheduled HD with nephrology, next treatment on Tuesday.    HFrEF (heart failure with reduced ejection fraction) (HCC) Stable, well compensated, well-perfused.  Holding outpatient carvedilol  and Lasix .    MDD (major depressive disorder), recurrent severe, without psychosis (HCC) Stable.  Continue outpatient aripiprazole .    Paroxysmal atrial fibrillation (HCC) Heart rate is regular.  Continue outpatient apixaban .    Thrombocytopenia (HCC)   Ascites Probable cirrhosis.  Decompensated cirrhosis as cause of his encephalopathy is lower on my differential right now.  Continue to monitor.    Decreased mobility Continue working with PT/OT.  SNF for subacute rehab recommended.  He is amenable to this plan today.  Resolved Problems:   Hypoglycemia   Hyperkalemia   Hyponatremia  VTE prophylaxis:  Diet: Regular IVF: N/A Code: Full PT/OT recommendations: SNF for subacute rehab TOC recommendations: Helping with SNF placement Family Update: Will update wife by phone  Discharge plan: Pending ongoing workup of diarrhea and waxing/waning encephalopathy, anticipate medical readiness in next day or so.  Ronald Kung MD 04/10/2024, 9:45 AM  Pager: [phone]

## 2024-04-10 NOTE — Plan of Care (Signed)
   Problem: Education: Goal: Knowledge of General Education information will improve Description: Including pain rating scale, medication(s)/side effects and non-pharmacologic comfort measures Outcome: Not Progressing   Problem: Health Behavior/Discharge Planning: Goal: Ability to manage health-related needs will improve Outcome: Not Progressing   Problem: Clinical Measurements: Goal: Ability to maintain clinical measurements within normal limits will improve Outcome: Not Progressing Goal: Will remain free from infection Outcome: Not Progressing Goal: Diagnostic test results will improve Outcome: Not Progressing Goal: Respiratory complications will improve Outcome: Not Progressing Goal: Cardiovascular complication will be avoided Outcome: Not Progressing   Problem: Activity: Goal: Risk for activity intolerance will decrease Outcome: Not Progressing   Problem: Nutrition: Goal: Adequate nutrition will be maintained Outcome: Not Progressing   Problem: Coping: Goal: Level of anxiety will decrease Outcome: Not Progressing   Problem: Elimination: Goal: Will not experience complications related to bowel motility Outcome: Not Progressing Goal: Will not experience complications related to urinary retention Outcome: Not Progressing   Problem: Pain Managment: Goal: General experience of comfort will improve and/or be controlled Outcome: Not Progressing   Problem: Safety: Goal: Ability to remain free from injury will improve Outcome: Not Progressing   Problem: Skin Integrity: Goal: Risk for impaired skin integrity will decrease Outcome: Not Progressing   Problem: Safety: Goal: Non-violent Restraint(s) Outcome: Not Progressing

## 2024-04-10 NOTE — Progress Notes (Signed)
 Waxing and waning alertness. No new complaints of pain. Declining food because it's not to his taste. Spoke with wife for better assessment of baseline before hospitalization. His physical and mental states have been declining for a while. However, he was markedly more confused last Thursday prior to hospitalization. He doesn't drink outside of the hospital. He uses drugs daily that people other than his wife procure for him. Yesterday he seemed better, she thought, but this morning he's been more sleepy. On exam he is alert to verbal stimulus. No focal neurologic deficits noted. Very mild hand-flapping tremor present.  68 year old admitted for encephalopathy, with likely cirrhosis, mental status waxing and waning despite HD and avoidance of CNS drugs. I'm going to trial treatment of hepatic encephalopathy with lactulose . Repeat RUQ ultrasound with doppler. I have low suspicion for SBP presently without infectious signs and small-volume ascites.  [name] MD 04/10/2024, 3:38 PM

## 2024-04-10 NOTE — Progress Notes (Signed)
 [facility] Progress Note   Subjective:  Seen in room. C/o watery diarrhea overnight and this AM. No CP/dyspnea. S/p full HD yesterday with 3L off.  Objective Vitals:   04/09/24 1333 04/09/24 1650 04/09/24 1938 04/10/24 0730  BP: 128/77 123/79 122/68 123/82  Pulse: 86 84 91 88  Resp: 20 20  19   Temp: (!) 97.5 F (36.4 C) 98.1 F (36.7 C) 97.6 F (36.4 C) 97.6 F (36.4 C)  TempSrc: Axillary Axillary Axillary Oral  SpO2: 100% 100% 98% 100%  Weight:      Height:       Physical Exam General: Frail man, NAD. Room air Heart: RRR Lungs: CTA anteriorly Abdomen: soft Extremities: no LE edema Dialysis Access:  [facility]  Additional Objective Labs: Basic Metabolic Panel: Recent Labs  Lab 04/07/24 2015 04/08/24 0127 04/09/24 0345  NA 133* 134* 132*  K 4.9 4.8 5.5*  CL 94* 95* 93*  CO2 21* 22 23  GLUCOSE 52* 81 98  BUN 39* 44* 62*  CREATININE 4.07* 4.62* 5.68*  CALCIUM  9.1 8.9 9.5  PHOS 4.9* 6.0* 7.4*   Liver Function Tests: Recent Labs  Lab 04/07/24 1410 04/07/24 2015 04/08/24 0127 04/09/24 0345  AST 25  --   --   --   ALT 25  --   --   --   ALKPHOS 113  --   --   --   BILITOT 0.7  --   --   --   PROT 6.5  --   --   --   ALBUMIN  2.8* 2.6* 2.4* 2.4*   CBC: Recent Labs  Lab 04/06/24 2157 04/07/24 0920 04/08/24 0127 04/09/24 0345  WBC 5.3 3.8* 4.2 5.5  NEUTROABS  --   --  3.6  --   HGB 13.3 14.5 13.8 12.5*  HCT 40.1 43.9 40.3 36.9*  MCV 80.4 81.3 78.3* 78.0*  PLT 107* 112* 135* 129*   Blood Culture    Component Value Date/Time   SDES URINE, CATHETERIZED 03/27/2024 0614   SPECREQUEST  03/27/2024 0614    NONE Performed at [facility], [address], [place], [place] [address]    CULT >=100,000 COLONIES/mL SERRATIA MARCESCENS (A) 03/27/2024 0614   REPTSTATUS 03/30/2024 FINAL 03/27/2024 9385   Studies/Results: US  ABDOMEN LIMITED WITH LIVER DOPPLER Result Date: 04/08/2024 CLINICAL DATA:  Ascites. EXAM: DUPLEX ULTRASOUND OF LIVER  TECHNIQUE: Color and duplex Doppler ultrasound was attempted to evaluate the hepatic in-flow and out-flow vessels. Patient terminated the exam prior to completion. COMPARISON:  None Available. FINDINGS: Liver: Not well assessed on the current exam. Main Portal Vein size: Not assessed on the current exam. Portal Vein Velocities Patient terminated the exam prior to assessment. Hepatic Vein Velocities Right:  53 cm/sec Middle:  36.8 cm/sec Left: Not assessed, patient terminated the exam prior to assessment. IVC: Not assessed, patient terminated the exam prior to assessment. Hepatic Artery Velocity: Not assessed, patient terminated the exam prior to assessment. Splenic Vein Velocity: Not assessed, patient terminated the exam prior to assessment. Spleen: Not assessed. Portal Vein Occlusion/Thrombus: Not assessed. Splenic Vein Occlusion/Thrombus: Not assessed. Ascites: Present in the right upper quadrant. Varices: Not assessed. IMPRESSION: 1. Right upper quadrant ascites. 2. Patent right and middle hepatic veins. Patient terminated the exam prior to interrogation of the remaining vasculature. Electronically Signed   By: Andrea Gasman M.D.   On: 04/08/2024 21:30   Medications:   acetaminophen   1,000 mg Oral Q8H   apixaban   5 mg Oral BID  arformoterol   15 mcg Nebulization BID   And   umeclidinium bromide   1 puff Inhalation Daily   ARIPiprazole   5 mg Oral Daily   Chlorhexidine  Gluconate Cloth  6 each Topical Q0600   Chlorhexidine  Gluconate Cloth  6 each Topical Q0600   diclofenac  Sodium  2 g Topical QID   feeding supplement (NEPRO CARB STEADY)  237 mL Oral BID BM   lidocaine   1 patch Transdermal Q24H   melatonin  3 mg Oral Once   thiamine   100 mg Oral Daily    Dialysis Orders [facility] TTS 4:00 BFR 400 EDW 62 kg 2K/2Ca TDC - Heparin  4000 + 2000 midrun  - calcitriol  2.25 q HD, sensipar  30 q HD  Assessment/Plan: Hyperkalemia: S/p urgent HD on admit, improving. ESRD: Usual TTS schedule - next HD  9/2. HTN/volume: BP good, now well prior prior EDW - lower on discharge. 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 HFrEF pA-fib: On Eliquis  Diarrhea: New 8/30-8/31. Per primary. Substance abuse disorder Dispo: SNF recommended/pending.   Izetta Boehringer, PA-C 04/10/2024, 9:02 AM  [employer]

## 2024-04-10 NOTE — Progress Notes (Signed)
 Physical Therapy Treatment Patient Details Name: Ronald Reeves MRN: [id number] DOB: 1956-03-27 Today's Date: 04/10/2024   History of Present Illness Pt is a 68 yr old male who presented 04/07/24 due to AMS and admitted due to toxic metabolic encephalopathy. PMH includes ESRD on HD, anemia, arthritis,COPD, CAD, HFrEF, polysubstance abuse, HTN, GERD, BPH, afib.    PT Comments  Pt is progressing towards goals. Currently pt is Mod A +2 for sit to stand and transfers with RW and CGA for sitting to supine. Improved knee ROM today with full extension bil. Spouse present during session and tends to over assist pt; educated on importance of letting pt mobilize on his own. Due to pt current functional status, home set up and available assistance at home recommending skilled physical therapy services < 3 hours/day in order to address strength, balance and functional mobility to decrease risk for falls, injury, immobility, skin break down and re-hospitalization.      If plan is discharge home, recommend the following: Two people to help with walking and/or transfers;Assist for transportation;Assistance with cooking/housework;Supervision due to cognitive status;Help with stairs or ramp for entrance   Can travel by private vehicle     No  Equipment Recommendations  Hospital bed;Hoyer lift       Precautions / Restrictions Precautions Precautions: Fall Recall of Precautions/Restrictions: Impaired Restrictions Weight Bearing Restrictions Per Provider Order: No     Mobility  Bed Mobility Overal bed mobility: Needs Assistance Bed Mobility: Sit to Supine       Sit to supine: Contact guard assist   General bed mobility comments: Min A with encouragement to move himself. Initially pt was yelling to pick up his legs and put them in the bed. Pt was told to try to assist and pt was able to lay down at [facility]    Transfers Overall transfer level: Needs assistance Equipment used: Rolling walker (2  wheels) Transfers: Sit to/from Stand, Bed to chair/wheelchair/BSC Sit to Stand: Mod assist, +2 physical assistance, +2 safety/equipment   Step pivot transfers: Mod assist, +2 physical assistance, +2 safety/equipment       General transfer comment: Improved bil knee mobility this session into full extension with increased time. Pt able to stand at recliner for peri care then step with Mod A +2 for balance, weakness to get to EOB. Heavy multi modal cues for safety and sequencing with poor AD navigation.    Ambulation/Gait       Pre-gait activities: Steps from recliner to EOB with Max A navigating AD, Mod A +2 for balance, strength        Balance Overall balance assessment: Needs assistance Sitting-balance support: Bilateral upper extremity supported, Feet supported Sitting balance-Leahy Scale: Poor Sitting balance - Comments: CGA sitting EOB   Standing balance support: Bilateral upper extremity supported, Reliant on assistive device for balance Standing balance-Leahy Scale: Zero Standing balance comment: Requires 2 person Mod A to stay standing.        Communication Communication Communication: Impaired Factors Affecting Communication: Difficulty expressing self;Reduced clarity of speech  Cognition Arousal: Lethargic Behavior During Therapy: Flat affect   PT - Cognitive impairments: Orientation, Awareness, Memory, Safety/Judgement, Problem solving, Sequencing, Initiation   Orientation impairments: Place, Time, Situation         PT - Cognition Comments: Pt confused as to where he is and why he is here. Oriented to spouse and self. Stating He owns the unit. Following commands: Impaired Following commands impaired: Follows one step commands inconsistently, Follows one step commands with  increased time    Cueing Cueing Techniques: Verbal cues, Gestural cues, Tactile cues, Visual cues     General Comments General comments (skin integrity, edema, etc.): Spouse present,  educating on importance of encouraging pt to mobilize without her assistance.      Pertinent Vitals/Pain Pain Assessment Pain Assessment: Faces Faces Pain Scale: Hurts whole lot Breathing: occasional labored breathing, short period of hyperventilation Negative Vocalization: occasional moan/groan, low speech, negative/disapproving quality Facial Expression: sad, frightened, frown Body Language: tense, distressed pacing, fidgeting Consolability: no need to console PAINAD Score: 4 Pain Location: low back, knees with mobility Pain Descriptors / Indicators: Aching, Discomfort, Grimacing, Guarding Pain Intervention(s): Monitored during session, Limited activity within patient's tolerance     PT Goals (current goals can now be found in the care plan section) Acute Rehab PT Goals Patient Stated Goal: Spouse would like for pt to get stronger PT Goal Formulation: With patient/family Time For Goal Achievement: 04/23/24 Potential to Achieve Goals: Fair Progress towards PT goals: Progressing toward goals    Frequency    Min 2X/week      PT Plan  Continue with current POC        AM-PAC PT 6 Clicks Mobility   Outcome Measure  Help needed turning from your back to your side while in a flat bed without using bedrails?: A Lot Help needed moving from lying on your back to sitting on the side of a flat bed without using bedrails?: A Lot Help needed moving to and from a bed to a chair (including a wheelchair)?: Total Help needed standing up from a chair using your arms (e.g., wheelchair or bedside chair)?: Total Help needed to walk in hospital room?: Total Help needed climbing 3-5 steps with a railing? : Total 6 Click Score: 8    End of Session Equipment Utilized During Treatment: Gait belt Activity Tolerance: Patient limited by pain;Patient tolerated treatment well Patient left: in bed;with call bell/phone within reach;with family/visitor present;with bed alarm set Nurse  Communication: Mobility status PT Visit Diagnosis: Unsteadiness on feet (R26.81);Other abnormalities of gait and mobility (R26.89);Muscle weakness (generalized) (M62.81);History of falling (Z91.81);Difficulty in walking, not elsewhere classified (R26.2)     Time: 1100-1118 PT Time Calculation (min) (ACUTE ONLY): 18 min  Charges:    $Therapeutic Activity: 8-22 mins PT General Charges $$ ACUTE PT VISIT: 1 Visit                    Dorothyann Maier, DPT, CLT  Acute Rehabilitation Services Office: [phone] (Secure chat preferred)    Dorothyann VEAR Maier 04/10/2024, 11:32 AM

## 2024-04-11 LAB — BLOOD GAS, VENOUS
Acid-Base Excess: 9.1 mmol/L — ABNORMAL HIGH (ref 0.0–2.0)
Bicarbonate: 35.2 mmol/L — ABNORMAL HIGH (ref 20.0–28.0)
O2 Saturation: 19.2 %
Patient temperature: 36.7
pCO2, Ven: 52 mmHg (ref 44–60)
pH, Ven: 7.43 (ref 7.25–7.43)
pO2, Ven: <31 mmHg — CL (ref 32–45)

## 2024-04-11 LAB — C DIFFICILE (CDIFF) QUICK SCRN (NO PCR REFLEX)
C Diff antigen: NEGATIVE
C Diff interpretation: NOT DETECTED
C Diff toxin: NEGATIVE

## 2024-04-11 LAB — GLUCOSE, CAPILLARY: Glucose-Capillary: 127 mg/dL — ABNORMAL HIGH (ref 70–99)

## 2024-04-11 MED ORDER — HYDROMORPHONE HCL 2 MG PO TABS
1.0000 mg | ORAL_TABLET | Freq: Once | ORAL | Status: AC
  Administered 2024-04-11: 1 mg via ORAL
  Filled 2024-04-11: qty 1

## 2024-04-11 MED ORDER — HYDROMORPHONE HCL 1 MG/ML IJ SOLN: 1.0000 mg | Freq: Once | INTRAMUSCULAR | Status: DC

## 2024-04-11 MED ORDER — PANTOPRAZOLE SODIUM 40 MG PO TBEC
40.0000 mg | DELAYED_RELEASE_TABLET | Freq: Once | ORAL | Status: AC
  Administered 2024-04-11: 40 mg via ORAL
  Filled 2024-04-11: qty 1

## 2024-04-11 MED ORDER — ONDANSETRON 4 MG PO TBDP
4.0000 mg | ORAL_TABLET | Freq: Once | ORAL | Status: AC
  Administered 2024-04-11: 4 mg via ORAL
  Filled 2024-04-11: qty 1

## 2024-04-11 MED ORDER — LACTULOSE 10 GM/15ML PO SOLN
20.0000 g | Freq: Three times a day (TID) | ORAL | Status: DC
  Administered 2024-04-11 – 2024-04-13 (×4): 20 g via ORAL
  Filled 2024-04-11 (×4): qty 30

## 2024-04-11 MED ORDER — CHLORHEXIDINE GLUCONATE CLOTH 2 % EX PADS
6.0000 | MEDICATED_PAD | Freq: Every day | CUTANEOUS | Status: DC
  Administered 2024-04-11 – 2024-04-15 (×4): 6 via TOPICAL

## 2024-04-11 NOTE — Progress Notes (Signed)
   04/11/24 2000  Spiritual Encounters  Type of Visit Initial  Care provided to: Pt and family  Conversation partners present during encounter Nurse  Referral source Nurse (RN/NT/LPN)  Reason for visit Routine spiritual support  OnCall Visit No   Chaplain responded to a request for prayer. The patient, Ronald Reeves and his spouse, Ronald Reeves welcomed me into their space. Ronald Reeves asked for prayer and Ronald Reeves filled in the details and we began to pray. Ronald Reeves worked hard to breath and chose to be present for our time of prayer.  As I departed I encouraged them to get some rest and allow Ronald Reeves some time to heal.   Carley Waddell Kerry  [facility]  [phone]

## 2024-04-11 NOTE — [place] FL2 (Signed)
 [place]  MEDICAID FL2 LEVEL OF CARE FORM     IDENTIFICATION  Patient Name: Ronald Reeves Birthdate: 05/20/1956 Sex: male Admission Date (Current Location): 04/07/2024  [facility] and [place] Number:  Producer, television/film/video and Address:  [facility] [place], [address], [place], [place] [address]      Provider Number: [id number]  Attending Physician Name and Address:  Karna Fellows, MD  Relative Name and Phone Number:  Polo,[name] Spouse [phone]    Current Level of Care: Hospital Recommended Level of Care: Skilled Nursing Facility Prior Approval Number:    Date Approved/Denied:   PASRR Number:    Discharge Plan: SNF    Current Diagnoses: Patient Active Problem List   Diagnosis Date Noted   Diarrhea 04/10/2024   Ascites 04/09/2024   Decreased mobility 04/09/2024   Thrombocytopenia (HCC) 04/08/2024   Clotted renal dialysis AV graft (HCC) 12/09/2023   Pancytopenia (HCC) 12/09/2023   Hyperlipidemia 12/09/2023   Arteriovenous fistula occlusion (HCC) 12/07/2023   Acute on chronic systolic (congestive) heart failure (HCC) 11/16/2023   Suicidal ideation 11/16/2023   History of depression 11/16/2023   Paroxysmal atrial fibrillation (HCC) 11/16/2023   MDD (major depressive disorder), recurrent severe, without psychosis (HCC) 11/15/2023   Hepatitis B immune 09/07/2023   COPD exacerbation (HCC) 09/04/2023   Acute exacerbation of CHF (congestive heart failure) (HCC) 09/04/2023   Hypervolemia 07/08/2023   Acute hypoxic respiratory failure (HCC) 07/08/2023   Acute hypoxemic respiratory failure (HCC) 07/08/2023   Acute pulmonary edema (HCC) 07/08/2023   Gabapentin -induced toxicity 03/19/2023   ESRD (end stage renal disease) (HCC) 03/19/2023   Acute metabolic encephalopathy 03/14/2023   Elevated troponin 03/14/2023   HFrEF (heart failure with reduced ejection fraction) (HCC) 03/14/2023   Cocaine  use 03/14/2023   History of DVT (deep vein thrombosis) 03/14/2023    History of CVA (cerebrovascular accident) 03/14/2023   Displacement of vascular dialysis catheter (HCC) 03/05/2023   Acute on chronic HFrEF (heart failure with reduced ejection fraction) (HCC) 12/17/2022   Enterococcal bacteremia 12/17/2022   Orthostatic hypotension 12/06/2022   Polyp of sigmoid colon 08/20/2022   Diverticulosis of colon without hemorrhage 08/20/2022   Change in stool habits 08/20/2022   Gastritis and gastroduodenitis 08/20/2022   Nausea and vomiting 08/20/2022   GERD without esophagitis 08/20/2022   Failure to thrive in adult 06/17/2022   ESRD (end stage renal disease) on dialysis TTS schedule (HCC) 01/07/2022   Alcohol use disorder, moderate, in sustained remission (HCC) 10/14/2021   Cervicalgia 10/14/2021   Cocaine  use disorder, severe, dependence (HCC) 10/14/2021   Noncompliance with medication regimen 10/14/2021   Knee pain 10/14/2021   Osteoarthritis 10/14/2021   Tobacco abuse 10/14/2021   Acute upper respiratory infection, unspecified 10/14/2021   Age-related nuclear cataract, bilateral 10/14/2021   Benign prostatic hyperplasia 10/14/2021   Cannabis use disorder, severe, dependence (HCC) 10/14/2021   Combinations of drug dependence excluding opioid type drug, abuse (HCC) 10/14/2021   Coronary artery disease 10/14/2021   COVID-19 10/14/2021   SOB (shortness of breath) 10/14/2021   Elevated PSA 10/14/2021   Encounter for screening for malignant neoplasm of respiratory organs 10/14/2021   Injury, other and unspecified, finger 10/14/2021   Left ventricular hypertrophy 10/14/2021   Legal circumstance 10/14/2021   HLD (hyperlipidemia) 10/14/2021   Pain in right foot 10/14/2021   Peripheral edema 10/14/2021   Polysubstance abuse (HCC) 10/14/2021   Prediabetes 10/14/2021   Recurrent major depressive disorder (HCC) 10/14/2021   Thoracic or lumbosacral neuritis or radiculitis 10/14/2021  Vitamin D deficiency 10/14/2021   ARF (acute renal failure) (HCC)  07/18/2020   Anemia of chronic disease 07/18/2020   Proteinuria 01/25/2020   Chest pain 08/20/2017   Chronic back pain 08/20/2017   Cervical spondylosis without myelopathy 08/05/2017   Neural foraminal stenosis of cervical spine 08/05/2017   Peripheral neuropathy 05/30/2013   Chronic kidney disease 05/30/2013   Essential hypertension 05/30/2013   Cervical radiculopathy 05/30/2013   Acute exacerbation of chronic low back pain 05/30/2013   Dental caries 05/30/2013   Gastroesophageal reflux disease 08/12/2011    Orientation RESPIRATION BLADDER Height & Weight     Self, Time, Place  Normal   Weight: 124 lb 9 oz (56.5 kg) Height:  5' 11 (180.3 cm)  BEHAVIORAL SYMPTOMS/MOOD NEUROLOGICAL BOWEL NUTRITION STATUS      Incontinent Diet (see discharge summary)  AMBULATORY STATUS COMMUNICATION OF NEEDS Skin   Total Care Verbally Normal                       Personal Care Assistance Level of Assistance  Bathing, Feeding, Dressing Bathing Assistance: Maximum assistance Feeding assistance: Limited assistance Dressing Assistance: Maximum assistance     Functional Limitations Info  Sight, Hearing, Speech Sight Info: Adequate Hearing Info: Adequate Speech Info: Adequate    SPECIAL CARE FACTORS FREQUENCY  PT (By licensed PT), OT (By licensed OT)     PT Frequency: 5x week OT Frequency: 5x week            Contractures Contractures Info: Not present    Additional Factors Info  Code Status, Allergies Code Status Info: full Allergies Info: Neurontin  (Gabapentin ), Vasotec  (Enalapril ), Iodinated Contrast Media           Current Medications (04/11/2024):  This is the current hospital active medication list Current Facility-Administered Medications  Medication Dose Route Frequency Provider Last Rate Last Admin   acetaminophen  (TYLENOL ) tablet 1,000 mg  1,000 mg Oral Q8H Jolaine Pac, DO   1,000 mg at 04/11/24 0524   albuterol  (PROVENTIL ) (2.5 MG/3ML) 0.083% nebulizer  solution 2.5 mg  2.5 mg Nebulization Q6H PRN Chen, Lydia D, RPH       apixaban  (ELIQUIS ) tablet 5 mg  5 mg Oral BID Jolaine Pac, DO   5 mg at 04/11/24 9089   arformoterol  (BROVANA ) nebulizer solution 15 mcg  15 mcg Nebulization BID Jolaine Pac, DO   15 mcg at 04/11/24 9087   And   umeclidinium bromide  (INCRUSE ELLIPTA ) 62.5 MCG/ACT 1 puff  1 puff Inhalation Daily Jolaine Pac, DO   1 puff at 04/11/24 0912   ARIPiprazole  (ABILIFY ) tablet 5 mg  5 mg Oral Daily Myrna, Olivia, DO   5 mg at 04/11/24 9089   Chlorhexidine  Gluconate Cloth 2 % PADS 6 each  6 each Topical Q0600 Stovall, Kathryn R, PA-C       [START ON 04/12/2024] cinacalcet  (SENSIPAR ) tablet 30 mg  30 mg Oral Q T,Th,Sa-HD Stovall, Kathryn R, PA-C       diclofenac  Sodium (VOLTAREN ) 1 % topical gel 2 g  2 g Topical QID Jolaine Pac, DO   2 g at 04/11/24 0912   feeding supplement (NEPRO CARB STEADY) liquid 237 mL  237 mL Oral BID BM Shawn Sick, MD   237 mL at 04/10/24 1632   lactulose  (CHRONULAC ) 10 GM/15ML solution 20 g  20 g Oral TID Norrine Sharper, MD       lidocaine  (LIDODERM ) 5 % 1 patch  1 patch Transdermal Q24H Jolaine Pac,  DO   1 patch at 04/10/24 1631   melatonin tablet 3 mg  3 mg Oral Once Tawkaliyar, Roya, DO       sevelamer  carbonate (RENVELA ) tablet 1,600 mg  1,600 mg Oral TID WC Stovall, Kathryn R, PA-C   1,600 mg at 04/11/24 9089   sodium chloride  flush (NS) 0.9 % injection 10-40 mL  10-40 mL Intracatheter PRN Shawn Sick, MD       thiamine  (VITAMIN B1) tablet 100 mg  100 mg Oral Daily Karna Fellows, MD   100 mg at 04/11/24 9089     Discharge Medications: Please see discharge summary for a list of discharge medications.  Relevant Imaging Results:  Relevant Lab Results:   Additional Information SSN: [id number].  HD pt: TTS [address] in [place], [place], [place]

## 2024-04-11 NOTE — Progress Notes (Signed)
 [facility] Progress Note   Subjective:   Seen in room - no CP/dyspnea. MS waxes/wanes. Chronically frail, s/p 1 dose lactulose  yesterday.  Objective Vitals:   04/10/24 1955 04/11/24 0500 04/11/24 0523 04/11/24 0905  BP: 98/77  111/67 (!) 114/52  Pulse: 75  78 90  Resp: 20  20 (!) 22  Temp:   97.6 F (36.4 C) (!) 96.2 F (35.7 C)  TempSrc:   Oral   SpO2: 100%  100% 96%  Weight:  56.5 kg    Height:       Physical Exam General: Frail man, NAD. Room air Heart: RRR Lungs: CTA anteriorly Abdomen: soft Extremities: no LE edema Dialysis Access:  [facility]  Additional Objective Labs: Basic Metabolic Panel: Recent Labs  Lab 04/07/24 2015 04/08/24 0127 04/09/24 0345 04/10/24 0957  NA 133* 134* 132* 134*  K 4.9 4.8 5.5* 4.4  CL 94* 95* 93* 92*  CO2 21* 22 23 27   GLUCOSE 52* 81 98 134*  BUN 39* 44* 62* 46*  CREATININE 4.07* 4.62* 5.68* 4.33*  CALCIUM  9.1 8.9 9.5 9.3  PHOS 4.9* 6.0* 7.4*  --    Liver Function Tests: Recent Labs  Lab 04/07/24 1410 04/07/24 2015 04/08/24 0127 04/09/24 0345  AST 25  --   --   --   ALT 25  --   --   --   ALKPHOS 113  --   --   --   BILITOT 0.7  --   --   --   PROT 6.5  --   --   --   ALBUMIN  2.8* 2.6* 2.4* 2.4*   CBC: Recent Labs  Lab 04/06/24 2157 04/07/24 0920 04/08/24 0127 04/09/24 0345 04/10/24 0957  WBC 5.3 3.8* 4.2 5.5 5.3  NEUTROABS  --   --  3.6  --  4.4  HGB 13.3 14.5 13.8 12.5* 13.1  HCT 40.1 43.9 40.3 36.9* 38.8*  MCV 80.4 81.3 78.3* 78.0* 78.1*  PLT 107* 112* 135* 129* 122*   Blood Culture    Component Value Date/Time   SDES URINE, CATHETERIZED 03/27/2024 0614   SPECREQUEST  03/27/2024 0614    NONE Performed at [facility], [address], [place], [place] [address]    CULT >=100,000 COLONIES/mL SERRATIA MARCESCENS (A) 03/27/2024 0614   REPTSTATUS 03/30/2024 FINAL 03/27/2024 9385   Studies/Results: US  ABDOMEN LIMITED WITH LIVER DOPPLER Result Date: 04/11/2024 CLINICAL DATA:   Cirrhosis. EXAM: DUPLEX ULTRASOUND OF LIVER TECHNIQUE: Color and duplex Doppler ultrasound was performed to evaluate the hepatic in-flow and out-flow vessels. COMPARISON:  CT abdomen 05/21/2023 and limited duplex exam from 04/08/2024 FINDINGS: Liver: Perihepatic ascites. Liver contour is mildly irregular without gross nodularity. Ultrasound findings are not definitive for cirrhosis. There may be a small amount of adherent sludge in the gallbladder. No definite stones. Main Portal Vein size: 0.9 cm Portal Vein Velocities Main Prox:  43 cm/sec Main Mid: 58 cm/sec Main Dist:  44 cm/sec Right: 33 cm/sec Left: 48 cm/sec Hepatic Vein Velocities Right:  123 cm/sec Middle:  59 cm/sec Left:  21 cm/sec IVC: Present and patent with normal respiratory phasicity. Hepatic Artery Velocity:  135 cm/sec Splenic Vein Velocity:  13 cm/sec Spleen: 8.3 cm x 3.9 cm x 3.5 cm with a total volume of 60 cm^3 (411 cm^3 is upper limit normal) Portal Vein Occlusion/Thrombus: No Splenic Vein Occlusion/Thrombus: No Ascites: Present Varices: None Normal hepatopetal flow in the portal veins. Normal hepatofugal flow in the hepatic veins. No biliary dilatation.  Common bile duct measures 0.6 cm. IMPRESSION: 1. Portal venous system is patent with normal direction of flow. 2. Ascites. Electronically Signed   By: Juliene Balder M.D.   On: 04/11/2024 08:08   Medications:   acetaminophen   1,000 mg Oral Q8H   apixaban   5 mg Oral BID   arformoterol   15 mcg Nebulization BID   And   umeclidinium bromide   1 puff Inhalation Daily   ARIPiprazole   5 mg Oral Daily   Chlorhexidine  Gluconate Cloth  6 each Topical Q0600   Chlorhexidine  Gluconate Cloth  6 each Topical Q0600   [START ON 04/12/2024] cinacalcet   30 mg Oral Q T,Th,Sa-HD   diclofenac  Sodium  2 g Topical QID   feeding supplement (NEPRO CARB STEADY)  237 mL Oral BID BM   lactulose   20 g Oral TID   lidocaine   1 patch Transdermal Q24H   melatonin  3 mg Oral Once   sevelamer  carbonate  1,600 mg Oral  TID WC   thiamine   100 mg Oral Daily    Dialysis Orders [facility] TTS 4:00 BFR 400 EDW 62 kg 2K/2Ca TDC - Heparin  4000 + 2000 midrun  - calcitriol  2.25 q HD, sensipar  30 q HD   Assessment/Plan: Hyperkalemia: S/p urgent HD on admit, improving. AMS: Waxes/wanes. Likely some degree of liver dz. ESRD: Usual TTS schedule - next HD 9/2. HTN/volume: BP good, now well prior prior EDW - lower on discharge. Anemia of ESRD: Hgb > 12, no ESA needed Secondary HPTH: CorrCa and Phos high - holding VDRA, continue sensipar  and binders (sevelamer ) Nutrition: Alb low, continue supps. HFrEF pA-fib: On Eliquis  Diarrhea: Per primary. Substance abuse disorder Dispo: SNF recommended/pending.   Izetta Boehringer, PA-C 04/11/2024, 9:50 AM  [employer]

## 2024-04-11 NOTE — Progress Notes (Signed)
 RE:  Ronald Reeves       Date of Birth: 2056-07-01      Date:   04/11/24       To Whom It May Concern:  Please be advised that the above-named patient will require a short-term nursing home stay - anticipated 30 days or less for rehabilitation and strengthening.  The plan is for return home.                 MD signature                Date

## 2024-04-11 NOTE — Progress Notes (Addendum)
 HD#3 SUBJECTIVE:  Patient Summary: Ronald Reeves is a 69 y.o. with a pertinent PMH of ESRD on HD TTS, HTN, paroxysmal a-fib on eliquis , HFrEF, prior CVA, and polysubstance use disorder, who presented with altered mental status and admitted for Toxic Metabolic Encephalopathy.   Overnight Events: Pt removed both IV lines  Interim History: The pt was seen and evaluated at the bedside this morning. He was very drowsy during our conversation and had to be woken up several times. He reported that he was not feeling well but was unable to elaborate. His wife was at the bedside and reported that he had many bowel movements last night and is concerned about his persistent confusion.  OBJECTIVE:  Vital Signs: Vitals:   04/10/24 1629 04/10/24 1955 04/11/24 0500 04/11/24 0523  BP:  98/77  111/67  Pulse:  75  78  Resp:  20  20  Temp:    97.6 F (36.4 C)  TempSrc:    Oral  SpO2: 96% 100%  100%  Weight:   56.5 kg   Height:       Supplemental O2: Room Air SpO2: 100 % O2 Flow Rate (L/min): 2 L/min  Filed Weights   04/09/24 0838 04/09/24 1308 04/11/24 0500  Weight: 60.7 kg 57.7 kg 56.5 kg     Intake/Output Summary (Last 24 hours) at 04/11/2024 0754 Last data filed at 04/10/2024 1806 Gross per 24 hour  Intake 240 ml  Output --  Net 240 ml   Net IO Since Admission: -6,816.01 mL [04/11/24 0754]  Physical Exam: Const: Awake, alert in NAD HENT: Normocephalic, atraumatic, mucus membranes moist Eyes: PERRL Card: RRR, No MRG, No pitting edema on LE's bilaterally  Resp: LCTAB, no increased work of breathing Abd: Distended and tympanic. NT Extremities: Warm, dry with skin tenting Neuro: No focal deficits noted   Patient Lines/Drains/Airways Status     Active Line/Drains/Airways     Name Placement date Placement time Site Days   Peripheral IV 20 G Anterior;Left;Upper Arm --  --  Arm  --   Fistula / Graft Right Upper arm Arteriovenous fistula 11/16/23  0000  Upper arm  144   Hemodialysis  Catheter Right Internal jugular Double lumen Permanent (Tunneled) 01/21/24  0853  Internal jugular  78            Pertinent labs and imaging:      Latest Ref Rng & Units 04/10/2024    9:57 AM 04/09/2024    3:45 AM 04/08/2024    1:27 AM  CBC  WBC 4.0 - 10.5 K/uL 5.3  5.5  4.2   Hemoglobin 13.0 - 17.0 g/dL 86.8  87.4  86.1   Hematocrit 39.0 - 52.0 % 38.8  36.9  40.3   Platelets 150 - 400 K/uL 122  129  135        Latest Ref Rng & Units 04/10/2024    9:57 AM 04/09/2024    3:45 AM 04/08/2024    1:27 AM  CMP  Glucose 70 - 99 mg/dL 865  98  81   BUN 8 - 23 mg/dL 46  62  44   Creatinine 0.61 - 1.24 mg/dL 5.66  4.31  5.37   Sodium 135 - 145 mmol/L 134  132  134   Potassium 3.5 - 5.1 mmol/L 4.4  5.5  4.8   Chloride 98 - 111 mmol/L 92  93  95   CO2 22 - 32 mmol/L 27  23  22    Calcium  8.9 -  10.3 mg/dL 9.3  9.5  8.9     No results found.  ASSESSMENT/PLAN:  Assessment: Principal Problem:   Acute metabolic encephalopathy Active Problems:   Essential hypertension   Alcohol use disorder, moderate, in sustained remission (HCC)   Cocaine  use disorder, severe, dependence (HCC)   ESRD (end stage renal disease) on dialysis TTS schedule (HCC)   HFrEF (heart failure with reduced ejection fraction) (HCC)   MDD (major depressive disorder), recurrent severe, without psychosis (HCC)   Paroxysmal atrial fibrillation (HCC)   Thrombocytopenia (HCC)   Ascites   Decreased mobility   Diarrhea   Plan: #Toxic metabolic encephalopathy #Hx of Cocaine  Use -The patient presented with increased encephalopathy. Per his wife, this has never happened before, however he does have intermittent confusion -He does use cocaine  daily, with his last use 8/27 --Initially concerned that acute encephalopathy was due to cocaine  withdrawal, however with prolonged hospitalization and no improvement of symptoms, concern for other etiologies.  -With hx of cirrhosis, lactulose  initiated yesterday. Pt had multiple BM's  without significant improvement in mental status -Will obtain blood cultures and VBG today to rule out other potential sources of encephalopathy   #Cirrhosis -Pt with documented hx of cirrhosis  -RUQ U/S with perihepatic ascites and patent portal venous system. -With distended and tympanic abd, will attempt to POCUS today for eval of ascites -Consider abx for SBP concern   #Goals of Care -Had long conversation with the patient and his wife this morning about the status of pt's chronic illness and lack of improvement throughout hospitalization. His wife is adamant that he will continue to improve and requested more tests be done.  -If pt continues down this road without medical improvement, will consult palliative medicine  #ESRD on HD TTS #Anion gap metabolic acidosis #electrolyte derangements -Stable -Continue to follow nephrology's recommendations--continue TTS schedule  #HFrEF #HTN -Last echo from 1/25 shows LVEF 20-25% with decreased LV function and grade 3 diastolic dysfunction.  -Currently without exacerbation -Pt noncompliant to GDMT   #Paroxysmal a-fib -Pt in NSR today -Continue eliquis  5mg  BID  #Rigth arm weakness #Prior CVA -CT head this admission without acute abnormality. -No focal weakness noted on exam.   #Psychiatric disorder -Pt with continued confusion -Continue abilify  5mg  BID  Best Practice: Diet: Renal diet IVF: Fluids: none, Rate: None VTE: eliquis  Code: Full  Disposition planning: Therapy Recs: None, DME: none DISPO: Anticipated discharge pending to Home pending clinical improvement.  Signature:  Schuyler [facility]  7:54 AM, 04/11/2024  On Call pager [phone]

## 2024-04-11 NOTE — Plan of Care (Addendum)
 Patient had multiple bowel movements overnight after lactulose  dose was given. Patient also removed two PIVs during the shift. Provider was notified and it was decided he would remain without PIV for now. Bed in lowest position, wheels locked, and call bell in reach. Problem: Education: Goal: Knowledge of General Education information will improve Description: Including pain rating scale, medication(s)/side effects and non-pharmacologic comfort measures Outcome: Progressing   Problem: Health Behavior/Discharge Planning: Goal: Ability to manage health-related needs will improve Outcome: Progressing   Problem: Clinical Measurements: Goal: Ability to maintain clinical measurements within normal limits will improve Outcome: Progressing Goal: Will remain free from infection Outcome: Progressing Goal: Diagnostic test results will improve Outcome: Progressing Goal: Respiratory complications will improve Outcome: Progressing Goal: Cardiovascular complication will be avoided Outcome: Progressing   Problem: Activity: Goal: Risk for activity intolerance will decrease Outcome: Progressing   Problem: Nutrition: Goal: Adequate nutrition will be maintained Outcome: Progressing   Problem: Coping: Goal: Level of anxiety will decrease Outcome: Progressing   Problem: Elimination: Goal: Will not experience complications related to bowel motility Outcome: Progressing Goal: Will not experience complications related to urinary retention Outcome: Progressing   Problem: Pain Managment: Goal: General experience of comfort will improve and/or be controlled Outcome: Progressing   Problem: Safety: Goal: Ability to remain free from injury will improve Outcome: Progressing   Problem: Skin Integrity: Goal: Risk for impaired skin integrity will decrease Outcome: Progressing   Problem: Safety: Goal: Non-violent Restraint(s) Outcome: Progressing

## 2024-04-11 NOTE — TOC Initial Note (Signed)
 Transition of Care [facility]) - Initial/Assessment Note    Patient Details  Name: Ronald Reeves MRN: [id number] Date of Birth: 01-Apr-1956  [facility] CM/SW Contact:    Bridget Cordella Simmonds, LCSW Phone Number: 04/11/2024, 11:32 AM  Clinical Narrative:   Pt with varying orientation and very sleepy, does respond to CSW questions but then drifts off to sleep.  CSW informed of PT recommendation with minimal response.  Permission given to speak with wife Ronald Reeves and all 3 listed children.  Pt reports he is from home with wife.  CSW spoke with Ronald Reeves by phone, discussed PT recommendation for SNF and she is in agreement.  She had questions about substance abuse treatment, discussed pt will need to be independent with mobility to go to residential treatment.  She also asked about POA, discussed process with chaplain office.  Referral sent out in hub for SNF. PASSR requires additional info. HD pt at [facility].                Expected Discharge Plan: Skilled Nursing Facility Barriers to Discharge: Continued Medical Work up, SNF Pending bed offer, Other (must enter comment) (substance use)   Patient Goals and CMS Choice Patient states their goals for this hospitalization and ongoing recovery are:: get home          Expected Discharge Plan and Services In-house Referral: Clinical Social Work   Post Acute Care Choice: Skilled Nursing Facility Living arrangements for the past 2 months: Single Family Home                                      Prior Living Arrangements/Services Living arrangements for the past 2 months: Single Family Home Lives with:: Spouse Patient language and need for interpreter reviewed:: Yes Do you feel safe going back to the place where you live?: Yes      Need for Family Participation in Patient Care: Yes (Comment) Care giver support system in place?: Yes (comment)   Criminal Activity/Legal Involvement Pertinent to Current  Situation/Hospitalization: No - Comment as needed  Activities of Daily Living   ADL Screening (condition at time of admission) Independently performs ADLs?: No Does the patient have a NEW difficulty with bathing/dressing/toileting/self-feeding that is expected to last >3 days?: Yes (Initiates electronic notice to provider for possible OT consult) Does the patient have a NEW difficulty with getting in/out of bed, walking, or climbing stairs that is expected to last >3 days?: Yes (Initiates electronic notice to provider for possible PT consult) Does the patient have a NEW difficulty with communication that is expected to last >3 days?: No Is the patient deaf or have difficulty hearing?: No Does the patient have difficulty seeing, even when wearing glasses/contacts?: Yes Does the patient have difficulty concentrating, remembering, or making decisions?: Yes  Permission Sought/Granted Permission sought to share information with : Family Supports Permission granted to share information with : Yes, Verbal Permission Granted  Share Information with NAME: wife Ronald Reeves, daughters [name] Meth, son Medford  Permission granted to share info w AGENCY: SNF        Emotional Assessment Appearance:: Appears older than stated age Attitude/Demeanor/Rapport: Lethargic Affect (typically observed): Pleasant Orientation: : Oriented to Self, Oriented to Place, Oriented to  Time      Admission diagnosis:  Hyperkalemia [E87.5] Uremia [N19] Uremic encephalopathy [G93.49, N19] Altered mental status, unspecified altered mental status type [R41.82] Patient Active Problem  List   Diagnosis Date Noted   Diarrhea 04/10/2024   Ascites 04/09/2024   Decreased mobility 04/09/2024   Thrombocytopenia (HCC) 04/08/2024   Clotted renal dialysis AV graft (HCC) 12/09/2023   Pancytopenia (HCC) 12/09/2023   Hyperlipidemia 12/09/2023   Arteriovenous fistula occlusion (HCC) 12/07/2023   Acute on chronic systolic (congestive)  heart failure (HCC) 11/16/2023   Suicidal ideation 11/16/2023   History of depression 11/16/2023   Paroxysmal atrial fibrillation (HCC) 11/16/2023   MDD (major depressive disorder), recurrent severe, without psychosis (HCC) 11/15/2023   Hepatitis B immune 09/07/2023   COPD exacerbation (HCC) 09/04/2023   Acute exacerbation of CHF (congestive heart failure) (HCC) 09/04/2023   Hypervolemia 07/08/2023   Acute hypoxic respiratory failure (HCC) 07/08/2023   Acute hypoxemic respiratory failure (HCC) 07/08/2023   Acute pulmonary edema (HCC) 07/08/2023   Gabapentin -induced toxicity 03/19/2023   ESRD (end stage renal disease) (HCC) 03/19/2023   Acute metabolic encephalopathy 03/14/2023   Elevated troponin 03/14/2023   HFrEF (heart failure with reduced ejection fraction) (HCC) 03/14/2023   Cocaine  use 03/14/2023   History of DVT (deep vein thrombosis) 03/14/2023   History of CVA (cerebrovascular accident) 03/14/2023   Displacement of vascular dialysis catheter (HCC) 03/05/2023   Acute on chronic HFrEF (heart failure with reduced ejection fraction) (HCC) 12/17/2022   Enterococcal bacteremia 12/17/2022   Orthostatic hypotension 12/06/2022   Polyp of sigmoid colon 08/20/2022   Diverticulosis of colon without hemorrhage 08/20/2022   Change in stool habits 08/20/2022   Gastritis and gastroduodenitis 08/20/2022   Nausea and vomiting 08/20/2022   GERD without esophagitis 08/20/2022   Failure to thrive in adult 06/17/2022   ESRD (end stage renal disease) on dialysis TTS schedule (HCC) 01/07/2022   Alcohol use disorder, moderate, in sustained remission (HCC) 10/14/2021   Cervicalgia 10/14/2021   Cocaine  use disorder, severe, dependence (HCC) 10/14/2021   Noncompliance with medication regimen 10/14/2021   Knee pain 10/14/2021   Osteoarthritis 10/14/2021   Tobacco abuse 10/14/2021   Acute upper respiratory infection, unspecified 10/14/2021   Age-related nuclear cataract, bilateral 10/14/2021    Benign prostatic hyperplasia 10/14/2021   Cannabis use disorder, severe, dependence (HCC) 10/14/2021   Combinations of drug dependence excluding opioid type drug, abuse (HCC) 10/14/2021   Coronary artery disease 10/14/2021   COVID-19 10/14/2021   SOB (shortness of breath) 10/14/2021   Elevated PSA 10/14/2021   Encounter for screening for malignant neoplasm of respiratory organs 10/14/2021   Injury, other and unspecified, finger 10/14/2021   Left ventricular hypertrophy 10/14/2021   Legal circumstance 10/14/2021   HLD (hyperlipidemia) 10/14/2021   Pain in right foot 10/14/2021   Peripheral edema 10/14/2021   Polysubstance abuse (HCC) 10/14/2021   Prediabetes 10/14/2021   Recurrent major depressive disorder (HCC) 10/14/2021   Thoracic or lumbosacral neuritis or radiculitis 10/14/2021   Vitamin D deficiency 10/14/2021   ARF (acute renal failure) (HCC) 07/18/2020   Anemia of chronic disease 07/18/2020   Proteinuria 01/25/2020   Chest pain 08/20/2017   Chronic back pain 08/20/2017   Cervical spondylosis without myelopathy 08/05/2017   Neural foraminal stenosis of cervical spine 08/05/2017   Peripheral neuropathy 05/30/2013   Chronic kidney disease 05/30/2013   Essential hypertension 05/30/2013   Cervical radiculopathy 05/30/2013   Acute exacerbation of chronic low back pain 05/30/2013   Dental caries 05/30/2013   Gastroesophageal reflux disease 08/12/2011   PCP:  Clinic, [facility] Pharmacy:   [facility] - [place], [place] - [address] [place]  [place] [address] Phone: [phone] Fax: [phone]     Social Drivers of Health (SDOH) Social History: SDOH Screenings   Food Insecurity: No Food Insecurity (04/07/2024)  Housing: Low Risk  (04/07/2024)  Transportation Needs: Unmet Transportation Needs (04/07/2024)  Utilities: Not At Risk (04/07/2024)  Financial Resource Strain: Low Risk  (02/25/2024)   Social Connections: Moderately Integrated (04/07/2024)  Stress: No Stress Concern Present (05/22/2023)   Received from [facility]  Tobacco Use: High Risk (04/07/2024)  Health Literacy: Adequate Health Literacy (02/25/2024)   SDOH Interventions:     Readmission Risk Interventions     No data to display

## 2024-04-11 NOTE — Plan of Care (Signed)

## 2024-04-12 LAB — CBC
HCT: 36.5 % — ABNORMAL LOW (ref 39.0–52.0)
Hemoglobin: 12.3 g/dL — ABNORMAL LOW (ref 13.0–17.0)
MCH: 26.5 pg (ref 26.0–34.0)
MCHC: 33.7 g/dL (ref 30.0–36.0)
MCV: 78.7 fL — ABNORMAL LOW (ref 80.0–100.0)
Platelets: 115 K/uL — ABNORMAL LOW (ref 150–400)
RBC: 4.64 MIL/uL (ref 4.22–5.81)
RDW: 17 % — ABNORMAL HIGH (ref 11.5–15.5)
WBC: 6.4 K/uL (ref 4.0–10.5)
nRBC: 0 % (ref 0.0–0.2)

## 2024-04-12 LAB — COMPREHENSIVE METABOLIC PANEL WITH GFR
ALT: 18 U/L (ref 0–44)
AST: 21 U/L (ref 15–41)
Albumin: 2.3 g/dL — ABNORMAL LOW (ref 3.5–5.0)
Alkaline Phosphatase: 110 U/L (ref 38–126)
Anion gap: 12 (ref 5–15)
BUN: 44 mg/dL — ABNORMAL HIGH (ref 8–23)
CO2: 27 mmol/L (ref 22–32)
Calcium: 9 mg/dL (ref 8.9–10.3)
Chloride: 94 mmol/L — ABNORMAL LOW (ref 98–111)
Creatinine, Ser: 3.88 mg/dL — ABNORMAL HIGH (ref 0.61–1.24)
GFR, Estimated: 16 mL/min — ABNORMAL LOW (ref >60)
Glucose, Bld: 107 mg/dL — ABNORMAL HIGH (ref 70–99)
Potassium: 3.9 mmol/L (ref 3.5–5.1)
Sodium: 133 mmol/L — ABNORMAL LOW (ref 135–145)
Total Bilirubin: 0.7 mg/dL (ref 0.0–1.2)
Total Protein: 6 g/dL — ABNORMAL LOW (ref 6.5–8.1)

## 2024-04-12 MED ORDER — HEPARIN SODIUM (PORCINE) 1000 UNIT/ML IJ SOLN
INTRAMUSCULAR | Status: AC
Start: 2024-04-12 — End: 2024-04-12
  Filled 2024-04-12: qty 4

## 2024-04-12 MED ORDER — HYDROMORPHONE HCL 2 MG PO TABS: 1.0000 mg | ORAL_TABLET | Freq: Four times a day (QID) | ORAL | Status: DC | PRN

## 2024-04-12 MED ORDER — OXYCODONE HCL 5 MG PO TABS
5.0000 mg | ORAL_TABLET | Freq: Four times a day (QID) | ORAL | Status: DC | PRN
  Administered 2024-04-12 – 2024-04-15 (×5): 5 mg via ORAL
  Filled 2024-04-12 (×5): qty 1

## 2024-04-12 MED ORDER — PENTAFLUOROPROP-TETRAFLUOROETH EX AERO: 1.0000 | INHALATION_SPRAY | CUTANEOUS | Status: DC | PRN

## 2024-04-12 MED ORDER — ALTEPLASE 2 MG IJ SOLR: 2.0000 mg | Freq: Once | INTRAMUSCULAR | Status: DC | PRN

## 2024-04-12 MED ORDER — ANTICOAGULANT SODIUM CITRATE 4% (200MG/5ML) IV SOLN: 5.0000 mL | Status: DC | PRN

## 2024-04-12 MED ORDER — HEPARIN SODIUM (PORCINE) 1000 UNIT/ML DIALYSIS
1000.0000 [IU] | INTRAMUSCULAR | Status: DC | PRN
  Administered 2024-04-12: 3800 [IU]

## 2024-04-12 MED ORDER — LIDOCAINE-PRILOCAINE 2.5-2.5 % EX CREA: 1.0000 | TOPICAL_CREAM | CUTANEOUS | Status: DC | PRN

## 2024-04-12 MED ORDER — LIDOCAINE HCL (PF) 1 % IJ SOLN: 5.0000 mL | INTRAMUSCULAR | Status: DC | PRN

## 2024-04-12 NOTE — Progress Notes (Signed)
 OT Cancellation Note  Patient Details Name: Ronald Reeves MRN: [id number] DOB: 1955/08/18   Cancelled Treatment:    Reason Eval/Treat Not Completed: Patient at procedure or test/ unavailable (at HD will follow up) Warrick POUR OTR/L  Acute Rehab Services  [phone] office number   [name] 04/12/2024, 8:23 AM

## 2024-04-12 NOTE — Plan of Care (Signed)
 Brief Palliative Medicine Progress Note:  PMT consult received re: Multisystem organ failure--the patient desires hospice care, but would like further information and discussion.   Chart reviewed.   Ronald Reeves has been in HD unit since this morning. Unable to visit with patient to discuss GOC/hospice today. PMT will re-attempt visit with patient tomorrow 9/3.  Thank you for allowing PMT to assist in the care of this patient.  Jalin Erpelding M. [facility] Palliative Medicine Team Team Phone: [phone] NO CHARGE

## 2024-04-12 NOTE — Progress Notes (Signed)
 [facility] Progress Note   Subjective:   Seen on HD - 2L UFG and tolerating. He's tearful and asked for pen/paper to journal his thoughts. Shared that he doesn't want to die as an addict. No CP/dyspnea. Appears going to SNF today.  Objective Vitals:   04/12/24 0930 04/12/24 1000 04/12/24 1030 04/12/24 1100  BP: 121/81 131/77 (!) 122/98 126/75  Pulse: 90 90    Resp: (!) 36 16 13 17   Temp:      TempSrc:      SpO2: 95% 93%  95%  Weight:      Height:       Physical Exam General: Frail man, NAD. Room air Heart: RRR Lungs: CTA anteriorly Abdomen: soft Extremities: no LE edema Dialysis Access:  [facility]  Additional Objective Labs: Basic Metabolic Panel: Recent Labs  Lab 04/07/24 2015 04/08/24 0127 04/09/24 0345 04/10/24 0957  NA 133* 134* 132* 134*  K 4.9 4.8 5.5* 4.4  CL 94* 95* 93* 92*  CO2 21* 22 23 27   GLUCOSE 52* 81 98 134*  BUN 39* 44* 62* 46*  CREATININE 4.07* 4.62* 5.68* 4.33*  CALCIUM  9.1 8.9 9.5 9.3  PHOS 4.9* 6.0* 7.4*  --    Liver Function Tests: Recent Labs  Lab 04/07/24 1410 04/07/24 2015 04/08/24 0127 04/09/24 0345  AST 25  --   --   --   ALT 25  --   --   --   ALKPHOS 113  --   --   --   BILITOT 0.7  --   --   --   PROT 6.5  --   --   --   ALBUMIN  2.8* 2.6* 2.4* 2.4*   CBC: Recent Labs  Lab 04/07/24 0920 04/08/24 0127 04/09/24 0345 04/10/24 0957 04/12/24 0830  WBC 3.8* 4.2 5.5 5.3 6.4  NEUTROABS  --  3.6  --  4.4  --   HGB 14.5 13.8 12.5* 13.1 12.3*  HCT 43.9 40.3 36.9* 38.8* 36.5*  MCV 81.3 78.3* 78.0* 78.1* 78.7*  PLT 112* 135* 129* 122* 115*   Studies/Results: US  ABDOMEN LIMITED WITH LIVER DOPPLER Result Date: 04/11/2024 CLINICAL DATA:  Cirrhosis. EXAM: DUPLEX ULTRASOUND OF LIVER TECHNIQUE: Color and duplex Doppler ultrasound was performed to evaluate the hepatic in-flow and out-flow vessels. COMPARISON:  CT abdomen 05/21/2023 and limited duplex exam from 04/08/2024 FINDINGS: Liver: Perihepatic ascites. Liver contour  is mildly irregular without gross nodularity. Ultrasound findings are not definitive for cirrhosis. There may be a small amount of adherent sludge in the gallbladder. No definite stones. Main Portal Vein size: 0.9 cm Portal Vein Velocities Main Prox:  43 cm/sec Main Mid: 58 cm/sec Main Dist:  44 cm/sec Right: 33 cm/sec Left: 48 cm/sec Hepatic Vein Velocities Right:  123 cm/sec Middle:  59 cm/sec Left:  21 cm/sec IVC: Present and patent with normal respiratory phasicity. Hepatic Artery Velocity:  135 cm/sec Splenic Vein Velocity:  13 cm/sec Spleen: 8.3 cm x 3.9 cm x 3.5 cm with a total volume of 60 cm^3 (411 cm^3 is upper limit normal) Portal Vein Occlusion/Thrombus: No Splenic Vein Occlusion/Thrombus: No Ascites: Present Varices: None Normal hepatopetal flow in the portal veins. Normal hepatofugal flow in the hepatic veins. No biliary dilatation. Common bile duct measures 0.6 cm. IMPRESSION: 1. Portal venous system is patent with normal direction of flow. 2. Ascites. Electronically Signed   By: Juliene Balder M.D.   On: 04/11/2024 08:08   Medications:   acetaminophen   1,000 mg Oral Q8H  apixaban   5 mg Oral BID   arformoterol   15 mcg Nebulization BID   And   umeclidinium bromide   1 puff Inhalation Daily   ARIPiprazole   5 mg Oral Daily   Chlorhexidine  Gluconate Cloth  6 each Topical Q0600   cinacalcet   30 mg Oral Q T,Th,Sa-HD   diclofenac  Sodium  2 g Topical QID   feeding supplement (NEPRO CARB STEADY)  237 mL Oral BID BM   lactulose   20 g Oral TID   lidocaine   1 patch Transdermal Q24H   melatonin  3 mg Oral Once   sevelamer  carbonate  1,600 mg Oral TID WC   thiamine   100 mg Oral Daily   Dialysis Orders [facility] TTS 4:00 BFR 400 EDW 62 kg 2K/2Ca TDC - Heparin  4000 + 2000 midrun  - calcitriol  2.25 q HD, sensipar  30 q HD   Assessment/Plan: Hyperkalemia: S/p urgent HD on admit, improving. AMS: Waxes/wanes. Likely some degree of liver dz ESRD: Usual TTS schedule - HD now. HTN/volume: BP good, now well  prior prior EDW - lower on discharge. Anemia of ESRD: Hgb > 12, no ESA needed Secondary HPTH: CorrCa and Phos high - holding VDRA, continue sensipar  and binders (sevelamer ) Nutrition: Alb low, continue supps. HFrEF pA-fib: On Eliquis  Diarrhea: Per primary. Substance abuse disorder Dispo: SNF recommended/pending.   Izetta Boehringer, PA-C 04/12/2024, 11:24 AM  [employer]

## 2024-04-12 NOTE — Plan of Care (Signed)
  Problem: Education: Goal: Knowledge of General Education information will improve Description: Including pain rating scale, medication(s)/side effects and non-pharmacologic comfort measures 04/12/2024 0528 by Bluford Nellie HERO, RN Outcome: Progressing 04/12/2024 0527 by Bluford Nellie HERO, RN Outcome: Progressing   Problem: Health Behavior/Discharge Planning: Goal: Ability to manage health-related needs will improve 04/12/2024 0528 by Bluford Nellie HERO, RN Outcome: Progressing 04/12/2024 0527 by Bluford Nellie HERO, RN Outcome: Progressing   Problem: Clinical Measurements: Goal: Ability to maintain clinical measurements within normal limits will improve 04/12/2024 0528 by Bluford Nellie HERO, RN Outcome: Progressing 04/12/2024 0527 by Bluford Nellie HERO, RN Outcome: Progressing Goal: Will remain free from infection 04/12/2024 0528 by Bluford Nellie HERO, RN Outcome: Progressing 04/12/2024 0527 by Bluford Nellie HERO, RN Outcome: Progressing Goal: Diagnostic test results will improve 04/12/2024 0528 by Bluford Nellie HERO, RN Outcome: Progressing 04/12/2024 0527 by Bluford Nellie HERO, RN Outcome: Progressing Goal: Respiratory complications will improve 04/12/2024 0528 by Bluford Nellie HERO, RN Outcome: Progressing 04/12/2024 0527 by Bluford Nellie HERO, RN Outcome: Progressing Goal: Cardiovascular complication will be avoided 04/12/2024 0528 by Bluford Nellie HERO, RN Outcome: Progressing 04/12/2024 0527 by Bluford Nellie HERO, RN Outcome: Progressing   Problem: Activity: Goal: Risk for activity intolerance will decrease 04/12/2024 0528 by Bluford Nellie HERO, RN Outcome: Progressing 04/12/2024 0527 by Bluford Nellie HERO, RN Outcome: Progressing   Problem: Nutrition: Goal: Adequate nutrition will be maintained 04/12/2024 0528 by Bluford Nellie HERO, RN Outcome: Progressing 04/12/2024 0527 by Bluford Nellie HERO, RN Outcome: Progressing   Problem: Coping: Goal: Level of anxiety will decrease 04/12/2024 0528 by Bluford Nellie HERO, RN Outcome: Progressing 04/12/2024 0527 by Bluford Nellie HERO,  RN Outcome: Progressing   Problem: Elimination: Goal: Will not experience complications related to bowel motility 04/12/2024 0528 by Bluford Nellie HERO, RN Outcome: Progressing 04/12/2024 0527 by Bluford Nellie HERO, RN Outcome: Progressing Goal: Will not experience complications related to urinary retention 04/12/2024 0528 by Bluford Nellie HERO, RN Outcome: Progressing 04/12/2024 0527 by Bluford Nellie HERO, RN Outcome: Progressing   Problem: Pain Managment: Goal: General experience of comfort will improve and/or be controlled 04/12/2024 0528 by Bluford Nellie HERO, RN Outcome: Progressing 04/12/2024 0527 by Bluford Nellie HERO, RN Outcome: Progressing   Problem: Safety: Goal: Ability to remain free from injury will improve 04/12/2024 0528 by Bluford Nellie HERO, RN Outcome: Progressing 04/12/2024 0527 by Bluford Nellie HERO, RN Outcome: Progressing   Problem: Skin Integrity: Goal: Risk for impaired skin integrity will decrease 04/12/2024 0528 by Bluford Nellie HERO, RN Outcome: Progressing 04/12/2024 0527 by Bluford Nellie HERO, RN Outcome: Progressing   Problem: Safety: Goal: Non-violent Restraint(s) 04/12/2024 0528 by Bluford Nellie HERO, RN Outcome: Progressing 04/12/2024 0527 by Bluford Nellie HERO, RN Outcome: Progressing

## 2024-04-12 NOTE — Progress Notes (Signed)
 PT Cancellation Note  Patient Details Name: Brandom Kerwin MRN: [id number] DOB: May 27, 1956   Cancelled Treatment:    Reason Eval/Treat Not Completed: Patient at procedure or test/unavailable  Currently in HD;  Will follow up later today as time allows;  Otherwise, will follow up for PT tomorrow;   Thank you,  Silvano Currier, PT  Acute Rehabilitation Services Office [phone]    Silvano VEAR Currier 04/12/2024, 9:26 AM

## 2024-04-12 NOTE — TOC Progression Note (Addendum)
 Transition of Care [facility]) - Progression Note    Patient Details  Name: Ronald Reeves MRN: [id number] Date of Birth: Dec 30, 1955  Transition of [facility]) CM/SW Contact  Lauraine FORBES Saa, LCSWA Phone Number: 04/12/2024, 10:15 AM  Clinical Narrative:     10:15 AM CSW submitted additional clinicals requested for PASRR. PASRR is currently pending.  1:01 PM CSW introduced self and role to patient's spouse, [name] (patient is not fully oriented). [name] consented CSW to email patient's current SNF options ([address], [facility], [facility], [facility], [facility], [facility]) to her with SNF quarterly Medicare ratings. CSW emailed [name] ([email or web address]) SNF options and Medicare ratings.  Expected Discharge Plan: Skilled Nursing Facility Barriers to Discharge: Continued Medical Work up, English as a second language teacher, Engineer, mining)               Expected Discharge Plan and Services In-house Referral: Clinical Social Work   Post Acute Care Choice: Skilled Nursing Facility Living arrangements for the past 2 months: Single Family Home                                       Social Drivers of Health (SDOH) Interventions SDOH Screenings   Food Insecurity: No Food Insecurity (04/07/2024)  Housing: Low Risk  (04/07/2024)  Transportation Needs: Unmet Transportation Needs (04/07/2024)  Utilities: Not At Risk (04/07/2024)  Financial Resource Strain: Low Risk  (02/25/2024)  Social Connections: Moderately Integrated (04/07/2024)  Stress: No Stress Concern Present (05/22/2023)   Received from [facility]  Tobacco Use: High Risk (04/07/2024)  Health Literacy: Adequate Health Literacy (02/25/2024)    Readmission Risk Interventions     No data to display

## 2024-04-12 NOTE — Progress Notes (Signed)
 HD#4 SUBJECTIVE:  Patient Summary: Ronald Reeves is a 68 y.o. with a pertinent PMH of ESRD on HD TTS, HTN, paroxysmal a-fib on eliquis , HFrEF, prior CVA, and polysubstance use disorder, who presented with altered mental status and admitted for Toxic Metabolic Encephalopathy.   Overnight Events: No acute events overnight  Interim History: The pt was seen and evaluated at the bedside this morning. He was significantly more alert this morning and initiated a goals of care conversation. He is interested in talking to palliative care and discussing options moving forward. All questions and concerns were addressed at this time.   OBJECTIVE:  Vital Signs: Vitals:   04/12/24 1200 04/12/24 1217 04/12/24 1223 04/12/24 1230  BP: 125/82 (!) 128/110 125/79 130/86  Pulse: 88 91 85 85  Resp: 18 (!) 25 13 18   Temp:   98.4 F (36.9 C)   TempSrc:      SpO2: 100% 97% 98% 100%  Weight:   56.9 kg   Height:       Supplemental O2: Room Air SpO2: 100 % O2 Flow Rate (L/min): 2 L/min  Filed Weights   04/12/24 0821 04/12/24 0852 04/12/24 1223  Weight: 54.8 kg 54.8 kg 56.9 kg     Intake/Output Summary (Last 24 hours) at 04/12/2024 1356 Last data filed at 04/12/2024 1223 Gross per 24 hour  Intake --  Output 1500 ml  Net -1500 ml   Net IO Since Admission: -8,316.01 mL [04/12/24 1356]  Physical Exam: Const: Awake, alert in NAD HENT: Normocephalic, atraumatic, mucus membranes moist Card: RRR, No MRG, No pitting edema on LE's bilaterally  Resp: LCTAB, no increased work of breathing Abd: Distended and tympanic. NT Extremities: Warm, dry with skin tenting Neuro: No focal deficits noted   Patient Lines/Drains/Airways Status     Active Line/Drains/Airways     Name Placement date Placement time Site Days   Peripheral IV 20 G Anterior;Left;Upper Arm --  --  Arm  --   Fistula / Graft Right Upper arm Arteriovenous fistula 11/16/23  0000  Upper arm  144   Hemodialysis Catheter Right Internal jugular  Double lumen Permanent (Tunneled) 01/21/24  0853  Internal jugular  78            Pertinent labs and imaging:      Latest Ref Rng & Units 04/12/2024    8:30 AM 04/10/2024    9:57 AM 04/09/2024    3:45 AM  CBC  WBC 4.0 - 10.5 K/uL 6.4  5.3  5.5   Hemoglobin 13.0 - 17.0 g/dL 87.6  86.8  87.4   Hematocrit 39.0 - 52.0 % 36.5  38.8  36.9   Platelets 150 - 400 K/uL 115  122  129        Latest Ref Rng & Units 04/10/2024    9:57 AM 04/09/2024    3:45 AM 04/08/2024    1:27 AM  CMP  Glucose 70 - 99 mg/dL 865  98  81   BUN 8 - 23 mg/dL 46  62  44   Creatinine 0.61 - 1.24 mg/dL 5.66  4.31  5.37   Sodium 135 - 145 mmol/L 134  132  134   Potassium 3.5 - 5.1 mmol/L 4.4  5.5  4.8   Chloride 98 - 111 mmol/L 92  93  95   CO2 22 - 32 mmol/L 27  23  22    Calcium  8.9 - 10.3 mg/dL 9.3  9.5  8.9     No results found.  ASSESSMENT/PLAN:  Assessment: Principal Problem:   Acute metabolic encephalopathy Active Problems:   Essential hypertension   Alcohol use disorder, moderate, in sustained remission (HCC)   Cocaine  use disorder, severe, dependence (HCC)   ESRD (end stage renal disease) on dialysis TTS schedule (HCC)   HFrEF (heart failure with reduced ejection fraction) (HCC)   MDD (major depressive disorder), recurrent severe, without psychosis (HCC)   Paroxysmal atrial fibrillation (HCC)   Thrombocytopenia (HCC)   Ascites   Decreased mobility   Diarrhea   Plan: #Toxic metabolic encephalopathy #Hx of Cocaine  Use #?Hepatic encephalopathy  -The patient presented with increased encephalopathy. Per his wife, this has never happened before, however he does have intermittent confusion -He does use cocaine  daily, with his last use 8/27 --Initially concerned that acute encephalopathy was due to cocaine  withdrawal, however with prolonged hospitalization and no improvement of symptoms, concern for other etiologies.  -Pt improved after lactulose  initiated and multiple large bowel movements.   -VBG yesterday without hypercarbia -BC NGTD  #Cirrhosis -Pt with documented hx of cirrhosis  -RUQ U/S with perihepatic ascites and patent portal venous system. -POCUS yesterday with very small pocket of ascitic fluid in LLQ.  -No acute concern for SBP at this time. -Continue lactulose   #Goals of Care -After conversations yesterday, pt is requesting a palliative care consult to discuss options moving forward -He would like to become DNR and has good understanding of his multiple comorbidities.  -Palliative consult  #ESRD on HD TTS #Anion gap metabolic acidosis #electrolyte derangements -Stable -Continue to follow nephrology's recommendations--continue TTS schedule  #HFrEF #HTN -Last echo from 1/25 shows LVEF 20-25% with decreased LV function and grade 3 diastolic dysfunction.  -Currently without exacerbation -Pt noncompliant to GDMT   #Paroxysmal a-fib -Pt in NSR today -Continue eliquis  5mg  BID  #Rigth arm weakness #Prior CVA -CT head this admission without acute abnormality. -No focal weakness noted on exam.   #Psychiatric disorder -Pt with continued confusion -Continue abilify  5mg  BID  Best Practice: Diet: Renal diet IVF: Fluids: none, Rate: None VTE: eliquis  Code: Full  Disposition planning: Therapy Recs: None, DME: none DISPO: Anticipated discharge pending to Home pending clinical improvement.  Signature:  Schuyler Myrna [facility]  1:56 PM, 04/12/2024  On Call pager [phone]

## 2024-04-13 DIAGNOSIS — I110 Hypertensive heart disease with heart failure: Secondary | ICD-10-CM

## 2024-04-13 DIAGNOSIS — Z7901 Long term (current) use of anticoagulants: Secondary | ICD-10-CM

## 2024-04-13 DIAGNOSIS — R4182 Altered mental status, unspecified: Secondary | ICD-10-CM

## 2024-04-13 DIAGNOSIS — Z66 Do not resuscitate: Secondary | ICD-10-CM

## 2024-04-13 DIAGNOSIS — G9341 Metabolic encephalopathy: Secondary | ICD-10-CM

## 2024-04-13 DIAGNOSIS — E875 Hyperkalemia: Secondary | ICD-10-CM

## 2024-04-13 DIAGNOSIS — Z711 Person with feared health complaint in whom no diagnosis is made: Secondary | ICD-10-CM

## 2024-04-13 DIAGNOSIS — Z515 Encounter for palliative care: Secondary | ICD-10-CM

## 2024-04-13 DIAGNOSIS — N19 Unspecified kidney failure: Secondary | ICD-10-CM

## 2024-04-13 DIAGNOSIS — F1490 Cocaine use, unspecified, uncomplicated: Secondary | ICD-10-CM

## 2024-04-13 DIAGNOSIS — R5383 Other fatigue: Secondary | ICD-10-CM

## 2024-04-13 DIAGNOSIS — I69351 Hemiplegia and hemiparesis following cerebral infarction affecting right dominant side: Secondary | ICD-10-CM

## 2024-04-13 DIAGNOSIS — Z7189 Other specified counseling: Secondary | ICD-10-CM

## 2024-04-13 LAB — GLUCOSE, CAPILLARY: Glucose-Capillary: 123 mg/dL — ABNORMAL HIGH (ref 70–99)

## 2024-04-13 LAB — CBC
HCT: 39.6 % (ref 39.0–52.0)
Hemoglobin: 13.4 g/dL (ref 13.0–17.0)
MCH: 26.7 pg (ref 26.0–34.0)
MCHC: 33.8 g/dL (ref 30.0–36.0)
MCV: 78.9 fL — ABNORMAL LOW (ref 80.0–100.0)
Platelets: 104 K/uL — ABNORMAL LOW (ref 150–400)
RBC: 5.02 MIL/uL (ref 4.22–5.81)
RDW: 17.4 % — ABNORMAL HIGH (ref 11.5–15.5)
WBC: 10.8 K/uL — ABNORMAL HIGH (ref 4.0–10.5)
nRBC: 0 % (ref 0.0–0.2)

## 2024-04-13 MED ORDER — NICOTINE 14 MG/24HR TD PT24
14.0000 mg | MEDICATED_PATCH | Freq: Every day | TRANSDERMAL | Status: DC
  Administered 2024-04-13 – 2024-04-15 (×3): 14 mg via TRANSDERMAL
  Filled 2024-04-13 (×3): qty 1

## 2024-04-13 NOTE — Progress Notes (Signed)
 This chaplain is present for F/U spiritual care and prayer. The chaplain reviewed the Pt. chart notes and consulted with PMT before the visit.  The Pt. wife-[name] is leaving for work as the Orthoptist enters.  [name] returns to the bedside for prayer with the Pt. [name] shares she has chosen a SNF and is hopeful for the Pt. to regain his strength.    This chaplain unsuccessfully attempted a connection to the supportive services of Palliative Care with [name]. [name] shared she will return at 7pm tonight. The chaplain attempted to determine [name]'s daily visit routine, when the Pt. ended the conversation with we have already discussed this.  The chaplain offered F/U spiritual care as needed.  Chaplain Leeroy Hummer [phone]

## 2024-04-13 NOTE — Progress Notes (Addendum)
 [facility] Progress Note   Subjective:   Seen in room. No new symptoms today. No CP/dyspnea. Per notes, patient has brought up hospice and wants to speak with palliative care.  Objective Vitals:   04/12/24 2020 04/13/24 0447 04/13/24 0454 04/13/24 0717  BP:  125/75    Pulse:  95    Resp:  20    Temp:  98.4 F (36.9 C)    TempSrc:  Oral    SpO2: 96% 96%  96%  Weight:   52.1 kg   Height:       Physical Exam General: Frail man, NAD. Room air Heart: RRR Lungs: CTA anteriorly Abdomen: distended, more tense today Extremities: no LE edema Dialysis Access:  [facility]  Additional Objective Labs: Basic Metabolic Panel: Recent Labs  Lab 04/07/24 2015 04/08/24 0127 04/09/24 0345 04/10/24 0957 04/12/24 1949  NA 133* 134* 132* 134* 133*  K 4.9 4.8 5.5* 4.4 3.9  CL 94* 95* 93* 92* 94*  CO2 21* 22 23 27 27   GLUCOSE 52* 81 98 134* 107*  BUN 39* 44* 62* 46* 44*  CREATININE 4.07* 4.62* 5.68* 4.33* 3.88*  CALCIUM  9.1 8.9 9.5 9.3 9.0  PHOS 4.9* 6.0* 7.4*  --   --    Liver Function Tests: Recent Labs  Lab 04/07/24 1410 04/07/24 2015 04/08/24 0127 04/09/24 0345 04/12/24 1949  AST 25  --   --   --  21  ALT 25  --   --   --  18  ALKPHOS 113  --   --   --  110  BILITOT 0.7  --   --   --  0.7  PROT 6.5  --   --   --  6.0*  ALBUMIN  2.8*   < > 2.4* 2.4* 2.3*   < > = values in this interval not displayed.   CBC: Recent Labs  Lab 04/07/24 0920 04/08/24 0127 04/09/24 0345 04/10/24 0957 04/12/24 0830  WBC 3.8* 4.2 5.5 5.3 6.4  NEUTROABS  --  3.6  --  4.4  --   HGB 14.5 13.8 12.5* 13.1 12.3*  HCT 43.9 40.3 36.9* 38.8* 36.5*  MCV 81.3 78.3* 78.0* 78.1* 78.7*  PLT 112* 135* 129* 122* 115*   Medications:   acetaminophen   1,000 mg Oral Q8H   apixaban   5 mg Oral BID   arformoterol   15 mcg Nebulization BID   And   umeclidinium bromide   1 puff Inhalation Daily   ARIPiprazole   5 mg Oral Daily   Chlorhexidine  Gluconate Cloth  6 each Topical Q0600   cinacalcet    30 mg Oral Q T,Th,Sa-HD   diclofenac  Sodium  2 g Topical QID   feeding supplement (NEPRO CARB STEADY)  237 mL Oral BID BM   lactulose   20 g Oral TID   lidocaine   1 patch Transdermal Q24H   melatonin  3 mg Oral Once   nicotine   14 mg Transdermal Q0600   sevelamer  carbonate  1,600 mg Oral TID WC   thiamine   100 mg Oral Daily   Dialysis Orders [facility] TTS 4:00 BFR 400 EDW 62 kg 2K/2Ca TDC - Heparin  4000 + 2000 midrun  - calcitriol  2.25 q HD, sensipar  30 q HD   Assessment/Plan: Hyperkalemia: S/p urgent HD on admit, improving. AMS: Waxes/wanes. Likely some degree of liver dz ESRD: Usual TTS schedule - next HD tomorrow, unless patient decided to stop HD. HTN/volume: BP good, now well prior prior EDW - lower on discharge. Anemia of ESRD:  Hgb > 12, no ESA needed Secondary HPTH: CorrCa and Phos high - holding VDRA, continue sensipar  and binders (sevelamer ) Nutrition: Alb low, continue supps. HFrEF pA-fib: On Eliquis  Diarrhea: Per primary. Substance abuse disorder Dispo: SNF recommended/pending.   Izetta Boehringer, PA-C 04/13/2024, 10:36 AM  [employer]

## 2024-04-13 NOTE — Consult Note (Signed)
 Consultation Note Date: 04/13/2024   Patient Name: Ronald Reeves  DOB: Apr 08, 1956  MRN: [id number]  Age / Sex: 68 y.o., male  PCP: [facility] Referring Physician: Shawn Sick, MD  Reason for Consultation: Establishing goals of care,  Multisystem organ failure--the patient desires hospice care, but would like further information and discussion  HPI/Patient Profile: 68 y.o. male  with past medical history of ESRD on HD, cocaine  use disorder, recurrent DVT on Eliquis , HFrEF (EF 20-25%), prior CVA, cirrhosis, unspecified psychiatric/mood disorder was admitted on 04/07/2024 s/p fall at home with toxic metabolic encephalopathy, hyperkalemia, anion gap metabolic acidosis, leukopenia.   Of note, patient has had 5 admissions and 14 ED visits in the last 6 months.  Clinical Assessment and Goals of Care: I have reviewed medical records including EPIC notes, labs, any available advanced directives, and imaging. Unable to receive report from primary RN.  Went to visit patient at bedside - no family/visitors present. Patient was sitting up in chair asleep - he does wake to voice/gentle touch though he quickly falls back asleep when not stimulated. He is oriented to self, place, and time - he does not answer situational questions - the answers to his questions are delayed. No signs or non-verbal gestures of pain or discomfort noted. No respiratory distress, increased work of breathing, or secretions noted. He is frail and ill appearing. Patient is not able to participate in meaningful GOC/hospice discussions this morning - will attempt visit again at a later time as able.  Discussed case with Dr. Myrna - hopeful for mental status improvement with lactulose .  12:37 PM Per RN, patient still lethargic at this time. Requested she notify me if patient's mentation improves.  Primary Decision Maker: PATIENT and NOK/wife     SUMMARY OF RECOMMENDATIONS   Unfortunately, patient is too lethargic for meaningful GOC/hospice care discussions today - will re-attempt discussions tomorrow 9/4  Continue DNR/DNI as previously documented - durable DNR form completed and placed in shadow chart. Copy was made and will be scanned into Vynca/ACP tab PMT will continue to follow and support holistically   Code Status/Advance Care Planning: DNR  Palliative Prophylaxis:  Delirium Protocol, Frequent Pain Assessment, Oral Care, and Turn Reposition  Additional Recommendations (Limitations, Scope, Preferences): Full Scope Treatment  Psycho-social/Spiritual:  Desire for further Chaplaincy support:no Created space and opportunity for patient and family to express thoughts and feelings regarding patient's current medical situation.  Emotional support and therapeutic listening provided.  Prognosis:  Poor in the setting of recurrent hospitalizations/ED visits and multiple comorbidities   Discharge Planning: To Be Determined      Primary Diagnoses: Present on Admission:  Acute metabolic encephalopathy  Essential hypertension  Cocaine  use disorder, severe, dependence (HCC)  HFrEF (heart failure with reduced ejection fraction) (HCC)  Paroxysmal atrial fibrillation (HCC)  (Resolved) Hypoglycemia  Thrombocytopenia (HCC)  (Resolved) Hyperkalemia  (Resolved) Hyponatremia  MDD (major depressive disorder), recurrent severe, without psychosis (HCC)  Ascites  Decreased mobility   I have reviewed the medical record, interviewed the patient and family, and examined the patient. The following aspects are pertinent.  Past Medical History:  Diagnosis Date   Anemia    Anxiety    Arthritis    Back pain    Bronchitis    COPD (chronic obstructive pulmonary disease) (HCC)    Coronary artery disease    COVID    mild - flu like symptoms   Dyspnea    w/ exertion, uses inhaler   ESRD on hemodialysis (HCC)  dialysis on tues,  thurs, sat at [facility]   GERD (gastroesophageal reflux disease)    not a current problem   Heart murmur    never has caused any problems   Hypertension    Myocardial infarction (HCC)    Pneumonia    x 1   Pre-diabetes    diet controlled, no meds, does not check blood sugar   Substance abuse (HCC)    Social History   Socioeconomic History   Marital status: Married    Spouse name: Not on file   Number of children: Not on file   Years of education: Not on file   Highest education level: Not on file  Occupational History   Not on file  Tobacco Use   Smoking status: Every Day    Current packs/day: 0.50    Types: Cigars, Cigarettes    Last attempt to quit: 2019    Passive exposure: Past   Smokeless tobacco: Never   Tobacco comments:    1-2 per day -  black and mild cigars  Vaping Use   Vaping status: Never Used  Substance and Sexual Activity   Alcohol use: Not Currently    Alcohol/week: 14.0 standard drinks of alcohol    Types: 14 Cans of beer per week    Comment: Reports quit 8 months ago   Drug use: Yes    Frequency: 1.0 times per week    Types: Marijuana, Cocaine     Comment: Cocaine  and cannabis daily for 30 years   Sexual activity: Yes  Other Topics Concern   Not on file  Social History Narrative   Not on file   Social Drivers of Health   Financial Resource Strain: Low Risk  (02/25/2024)   Overall Financial Resource Strain (CARDIA)    Difficulty of Paying Living Expenses: Not very hard  Food Insecurity: No Food Insecurity (04/07/2024)   Hunger Vital Sign    Worried About Running Out of Food in the Last Year: Never true    Ran Out of Food in the Last Year: Never true  Transportation Needs: Unmet Transportation Needs (04/07/2024)   PRAPARE - Transportation    Lack of Transportation (Medical): Yes    Lack of Transportation (Non-Medical): Yes  Physical Activity: Not on file  Stress: No Stress Concern Present (05/22/2023)   Received from [facility] Occupational Health - Occupational Stress Questionnaire    Feeling of Stress : Not at all  Social Connections: Moderately Integrated (04/07/2024)   Social Connection and Isolation Panel    Frequency of Communication with Friends and Family: Three times a week    Frequency of Social Gatherings with Friends and Family: Three times a week    Attends Religious Services: 1 to 4 times per year    Active Member of Clubs or Organizations: No    Attends Banker Meetings: Never    Marital Status: Married   Family History  Problem Relation Age of Onset   Heart disease Mother    Heart attack Sister 69   Scheduled Meds:  acetaminophen   1,000 mg Oral Q8H   apixaban   5 mg Oral BID   arformoterol   15 mcg Nebulization BID   And   umeclidinium bromide   1 puff Inhalation Daily   ARIPiprazole   5 mg Oral Daily   Chlorhexidine  Gluconate Cloth  6 each Topical Q0600   cinacalcet   30 mg Oral Q T,Th,Sa-HD   diclofenac  Sodium  2 g Topical QID  feeding supplement (NEPRO CARB STEADY)  237 mL Oral BID BM   lactulose   20 g Oral TID   lidocaine   1 patch Transdermal Q24H   melatonin  3 mg Oral Once   nicotine   14 mg Transdermal Q0600   sevelamer  carbonate  1,600 mg Oral TID WC   thiamine   100 mg Oral Daily   Continuous Infusions: PRN Meds:.albuterol , oxyCODONE , sodium chloride  flush Medications Prior to Admission:  Prior to Admission medications   Medication Sig Start Date End Date Taking? Authorizing Provider  acetaminophen  (TYLENOL ) 500 MG tablet Take 500-1,000 mg by mouth 2 (two) times daily as needed for moderate pain (pain score 4-6), fever or headache.   Yes [provider]  amLODipine  (NORVASC ) 10 MG tablet Take 1 tablet (10 mg total) by mouth daily. 01/01/18  Yes Pietro Redell RAMAN, MD  apixaban  (ELIQUIS ) 5 MG TABS tablet Take 1 tablet (5 mg total) by mouth 2 (two) times daily. 03/06/23  Yes Atway, Rayann N, DO  ARIPiprazole  (ABILIFY ) 5 MG tablet Take 1 tablet (5 mg  total) by mouth daily. 11/20/23  Yes Ghimire, Donalda HERO, MD  Ascorbic Acid (VITAMIN C PO) Take 1 tablet by mouth daily as needed (immune support).   Yes [provider]  carvedilol  (COREG ) 12.5 MG tablet Take 12.5 mg by mouth 2 (two) times daily.   Yes [provider]  escitalopram  (LEXAPRO ) 10 MG tablet Take 10 mg by mouth daily.   Yes [provider]  furosemide  (LASIX ) 80 MG tablet Take 80 mg by mouth See admin instructions. Take 1 tablet (80mg ) by mouth once daily on non-dialysis days only. (Sunday, Monday, Wednesday, Friday).   Yes [provider]  HYDROcodone -acetaminophen  (NORCO) 10-325 MG tablet Take 1 tablet by mouth every 6 (six) hours as needed for severe pain (pain score 7-10). 02/18/24  Yes [provider]  Ibuprofen -Acetaminophen  (ADVIL  DUAL ACTION PO) Take 3 tablets by mouth every 6 (six) hours as needed (pain, headache).   Yes [provider]  isosorbide  mononitrate (IMDUR ) 30 MG 24 hr tablet Take 30 mg by mouth daily.   Yes [provider]  lidocaine  (LIDODERM ) 5 % Place 1 patch onto the skin daily as needed (pain).   Yes [provider]  mirtazapine  (REMERON ) 15 MG tablet Take 7.5 mg by mouth at bedtime.   Yes [provider]  omeprazole  (PRILOSEC) 40 MG capsule Take 1 capsule (40 mg total) by mouth daily. Patient taking differently: Take 40 mg by mouth daily before breakfast. 06/06/22  Yes Kennedy-Demitria Hay, Colleen M, NP  ondansetron  (ZOFRAN -ODT) 8 MG disintegrating tablet Take 8 mg by mouth every 8 (eight) hours as needed for nausea or vomiting. 11/06/23  Yes [provider]  rosuvastatin  (CRESTOR ) 10 MG tablet Take 10 mg by mouth at bedtime.   Yes [provider]  sevelamer  carbonate (RENVELA ) 800 MG tablet Take 800-1,600 mg by mouth 2 (two) times daily with a meal. 01/06/23  Yes [provider]  sorbitol 70 % solution Take 30 mLs by mouth 3 (three) times daily as needed  (constipation). 01/20/23  Yes [provider]  tamsulosin  (FLOMAX ) 0.4 MG CAPS capsule Take 0.4 mg by mouth daily after breakfast.   Yes [provider]  Tiotropium Bromide-Olodaterol 2.5-2.5 MCG/ACT AERS Take 2 puffs by mouth See admin instructions. Inhale 2 puffs into the lungs every morning and every 6 hours as needed for wheezing and shortness of breath. 08/07/23  Yes [provider]  albuterol  (VENTOLIN  HFA)  108 (90 Base) MCG/ACT inhaler Inhale 2 puffs into the lungs every 6 (six) hours as needed (COPD). Patient not taking: Reported on 04/11/2024    [provider]  mometasone [facility]) 220 MCG/ACT inhaler Inhale 2 puffs into the lungs daily. Patient not taking: Reported on 04/11/2024    [provider]  oxyCODONE -acetaminophen  (PERCOCET/ROXICET) 5-325 MG tablet Take 1 tablet by mouth every 4 (four) hours as needed for severe pain (pain score 7-10). Patient not taking: Reported on 04/11/2024 04/06/24   Patsey Lot, MD   Allergies  Allergen Reactions   Neurontin  [Gabapentin ] Other (See Comments)    Encephalopathy Tremor    Vasotec  [Enalapril ] Other (See Comments)    Renal failure syndrome   Iodinated Contrast Media Nausea And Vomiting   Review of Systems  Unable to perform ROS: Mental status change    Physical Exam Vitals and nursing note reviewed.  Constitutional:      General: He is not in acute distress.    Appearance: He is ill-appearing.  Pulmonary:     Effort: No respiratory distress.  Skin:    General: Skin is warm and dry.  Neurological:     Mental Status: He is lethargic.     Motor: Weakness present.  Psychiatric:        Speech: Speech is delayed.        Behavior: Behavior is cooperative.     Vital Signs: BP 125/75 Comment: 89  Pulse 95   Temp 98.4 F (36.9 C) (Oral)   Resp 20   Ht 5' 11 (1.803 m)   Wt 52.1 kg   SpO2 96%   BMI 16.02 kg/m  Pain Scale: 0-10   Pain Score: 0-No pain   SpO2: SpO2: 96 % O2  Device:SpO2: 96 % O2 Flow Rate: .O2 Flow Rate (L/min): 2 L/min  IO: Intake/output summary:  Intake/Output Summary (Last 24 hours) at 04/13/2024 1050 Last data filed at 04/12/2024 1223 Gross per 24 hour  Intake --  Output 1500 ml  Net -1500 ml    LBM: Last BM Date : 04/13/24 Baseline Weight: Weight: 72 kg Most recent weight: Weight: 52.1 kg     Palliative Assessment/Data: PPS 50%     Time In: 1045 Time Out: 1150 Time Total: 65 minutes  Signed by: Jeoffrey CHRISTELLA Sharps, NP   Please contact Palliative Medicine Team phone at [phone] for questions and concerns.  For individual provider: See Amion  *Portions of this note are a verbal dictation therefore any spelling and/or grammatical errors are due to the Dragon Medical One system interpretation.

## 2024-04-13 NOTE — Plan of Care (Signed)

## 2024-04-13 NOTE — TOC Progression Note (Addendum)
 Transition of Care [facility]) - Progression Note    Patient Details  Name: Ronald Reeves MRN: [id number] Date of Birth: 06/08/1956  [facility] CM/SW Contact  Lauraine FORBES Saa, LCSWA Phone Number: 04/13/2024, 9:08 AM  Clinical Narrative:     9:08 AM CSW spoke with patient's spouse, Ronald Reeves, regarding SNF decision. Ronald Reeves stated that she has narrowed down the options to [facility] and [facility]. Ronald Reeves informed CSW that she would provide a decision later today. CSW answered questions regarding SNF insurance coverage and addressed concerns regarding patient's admission by relaying concerns to unit nursing director. Patient's PASRR remains pending. CSW will continue to follow.  11:13 AM Patient's spouse informed CSW of SNF bed decision [facility]). CSW informed SNF of bed acceptance. CSW inquired medical team of patient's expected discharge date. MD informed CSW, Palliative Care NP and bedside RN that patient is lucid and able to participate in medical decision making (this was a new development). MD resident stated that patient has expressed preference in discharging home with palliative or hospice and is to meet with Palliative Care NP and spouse to participate in GOC meeting. CSW made RNCM aware of patient's preferences. TOC will continue to follow and be available to assist.  11:49 AM Traill Must requested H&P for PASRR review. CSW submitted patient's H&P. PASRR remains pending.  2:44 PM CSW submitted patient's SNF insurance authorization which was automatically approved and is valid 04/14/2024-04/18/2024. PASRR remains pending. CSW informed MD resident that patient will need PASRR prior to discharging to SNF.  Expected Discharge Plan: Skilled Nursing Facility Barriers to Discharge: Continued Medical Work up, English as a second language teacher, Engineer, mining)               Expected Discharge Plan and Services In-house Referral: Clinical Social Work   Post  Acute Care Choice: Skilled Nursing Facility Living arrangements for the past 2 months: Single Family Home                                       Social Drivers of Health (SDOH) Interventions SDOH Screenings   Food Insecurity: No Food Insecurity (04/07/2024)  Housing: Low Risk  (04/07/2024)  Transportation Needs: Unmet Transportation Needs (04/07/2024)  Utilities: Not At Risk (04/07/2024)  Financial Resource Strain: Low Risk  (02/25/2024)  Social Connections: Moderately Integrated (04/07/2024)  Stress: No Stress Concern Present (05/22/2023)   Received from [facility]  Tobacco Use: High Risk (04/07/2024)  Health Literacy: Adequate Health Literacy (02/25/2024)    Readmission Risk Interventions     No data to display

## 2024-04-13 NOTE — Progress Notes (Signed)
 HD#5 SUBJECTIVE:  Patient Summary: Ronald Reeves is a 68 y.o. with a pertinent PMH of ESRD on HD TTS, HTN, paroxysmal a-fib on eliquis , HFrEF, prior CVA, and polysubstance use disorder, who presented with altered mental status and admitted for Toxic Metabolic Encephalopathy.   Overnight Events: No acute events overnight  Interim History: The pt was seen and evaluated at the bedside this morning. He was lethargic and difficult to arouse. He was oriented to self, but answered that he was at home. He was asking for his clothes and looking for his wife. Successfully re-oriented him and made sure he was comfortable before leaving the room.   OBJECTIVE:  Vital Signs: Vitals:   04/12/24 2020 04/13/24 0447 04/13/24 0454 04/13/24 0717  BP:  125/75    Pulse:  95    Resp:  20    Temp:  98.4 F (36.9 C)    TempSrc:  Oral    SpO2: 96% 96%  96%  Weight:   52.1 kg   Height:       Supplemental O2: Room Air SpO2: 96 % O2 Flow Rate (L/min): 2 L/min  Filed Weights   04/12/24 0852 04/12/24 1223 04/13/24 0454  Weight: 54.8 kg 56.9 kg 52.1 kg     Intake/Output Summary (Last 24 hours) at 04/13/2024 1141 Last data filed at 04/12/2024 1223 Gross per 24 hour  Intake --  Output 1500 ml  Net -1500 ml   Net IO Since Admission: -8,316.01 mL [04/13/24 1141]  Physical Exam: Const: Awake, alert in NAD, cachetic appearing HENT: Normocephalic, atraumatic, mucus membranes moist Card: RRR, No MRG, No pitting edema on LE's bilaterally  Resp: LCTAB, no increased work of breathing Abd: Distended and tympanic. NT Extremities: Warm, dry with skin tenting Neuro: No focal deficits noted   Patient Lines/Drains/Airways Status     Active Line/Drains/Airways     Name Placement date Placement time Site Days   Peripheral IV 20 G Anterior;Left;Upper Arm --  --  Arm  --   Fistula / Graft Right Upper arm Arteriovenous fistula 11/16/23  0000  Upper arm  144   Hemodialysis Catheter Right Internal jugular Double  lumen Permanent (Tunneled) 01/21/24  0853  Internal jugular  78            Pertinent labs and imaging:      Latest Ref Rng & Units 04/12/2024    8:30 AM 04/10/2024    9:57 AM 04/09/2024    3:45 AM  CBC  WBC 4.0 - 10.5 K/uL 6.4  5.3  5.5   Hemoglobin 13.0 - 17.0 g/dL 87.6  86.8  87.4   Hematocrit 39.0 - 52.0 % 36.5  38.8  36.9   Platelets 150 - 400 K/uL 115  122  129        Latest Ref Rng & Units 04/12/2024    7:49 PM 04/10/2024    9:57 AM 04/09/2024    3:45 AM  CMP  Glucose 70 - 99 mg/dL 892  865  98   BUN 8 - 23 mg/dL 44  46  62   Creatinine 0.61 - 1.24 mg/dL 6.11  5.66  4.31   Sodium 135 - 145 mmol/L 133  134  132   Potassium 3.5 - 5.1 mmol/L 3.9  4.4  5.5   Chloride 98 - 111 mmol/L 94  92  93   CO2 22 - 32 mmol/L 27  27  23    Calcium  8.9 - 10.3 mg/dL 9.0  9.3  9.5  Total Protein 6.5 - 8.1 g/dL 6.0     Total Bilirubin 0.0 - 1.2 mg/dL 0.7     Alkaline Phos 38 - 126 U/L 110     AST 15 - 41 U/L 21     ALT 0 - 44 U/L 18       No results found.  ASSESSMENT/PLAN:  Assessment: Principal Problem:   Acute metabolic encephalopathy Active Problems:   Essential hypertension   Alcohol use disorder, moderate, in sustained remission (HCC)   Cocaine  use disorder, severe, dependence (HCC)   ESRD (end stage renal disease) on dialysis TTS schedule (HCC)   HFrEF (heart failure with reduced ejection fraction) (HCC)   MDD (major depressive disorder), recurrent severe, without psychosis (HCC)   Paroxysmal atrial fibrillation (HCC)   Thrombocytopenia (HCC)   Ascites   Decreased mobility   Diarrhea   Plan: #Toxic metabolic encephalopathy #Hx of Cocaine  Use #?Hepatic encephalopathy  -The patient presented with increased encephalopathy. Per his wife, this has never happened before, however he does have intermittent confusion -He does use cocaine  daily, with his last use 8/27 --Initially concerned that acute encephalopathy was due to cocaine  withdrawal, however with prolonged  hospitalization and minimal improvement of symptoms, concern for other etiologies.  -VBG without hypercarbia -BC NGTD -Pt improved after lactulose  initiated and multiple large bowel movements.  -Pt was unable to take lactulose  yesterday due to being in dialysis for most of the day. Hopefully he will be able to receive his doses today with improvement in his mentation.   #Cirrhosis -Pt with documented hx of cirrhosis  -RUQ U/S with perihepatic ascites and patent portal venous system. -POCUS with very small pocket of ascitic fluid in LLQ.  -No acute concern for SBP at this time. -Continue lactulose   #Multisystem organ failure #Goals of Care -Pt continues to request palliative care consult to discuss options moving forward -He would like to become DNR with understanding of consequences in case of cardiac arrest and has good understanding of his multiple comorbidities.  -Palliative attempted to see today, however pt was too encephalopathic. They will attempt later today or tomorrow. Appreciate their assistance.   #ESRD on HD TTS #Anion gap metabolic acidosis #electrolyte derangements -Stable -Continue to follow nephrology's recommendations--continue TTS schedule  #HFrEF #HTN -Last echo from 1/25 shows LVEF 20-25% with decreased LV function and grade 3 diastolic dysfunction.  -Currently without exacerbation -Pt noncompliant to GDMT   #Paroxysmal a-fib -Pt in NSR today -Continue eliquis  5mg  BID  #Rigth arm weakness #Prior CVA -CT head this admission without acute abnormality. -No focal weakness noted on exam.   #Psychiatric disorder -Pt's mentation significantly improving.  -Continue abilify  5mg  BID  Best Practice: Diet: Renal diet IVF: Fluids: none, Rate: None VTE: eliquis  Code: Full  Disposition planning: Therapy Recs: None, DME: none DISPO: Anticipated discharge pending to Home pending clinical improvement.  Signature:  [name], DO [facility]  11:41 AM, 04/13/2024  On Call pager [phone]

## 2024-04-13 NOTE — Progress Notes (Signed)
 Physical Therapy Treatment Patient Details Name: Ronald Reeves MRN: [id number] DOB: 1955-11-06 Today's Date: 04/13/2024   History of Present Illness Pt is a 68 yr old male who presented 04/07/24 due to AMS and admitted due to toxic metabolic encephalopathy. PMH includes ESRD on HD, anemia, arthritis,COPD, CAD, HFrEF, polysubstance abuse, HTN, GERD, BPH, afib.    PT Comments  Continuing work on functional mobility and activity tolerance;  session focused on functional transfers, and we opted to use the stedy for work on sit to stand; performed 3 bouts of sit to stand within the stedy, tolerating up to 10 seconds of standing before fatigue and needing to sit; Assisted with peri-care during standing bouts; Due to pt current functional status, PLOF, home set up and available assistance at home recommending skilled physical therapy services< 3 hours/day on discharge from acute care hospital setting in order to decrease risk for falls, injury, immobility, skin break down and re-hospitalization.     If plan is discharge home, recommend the following: Two people to help with walking and/or transfers;Assist for transportation;Assistance with cooking/housework;Supervision due to cognitive status;Help with stairs or ramp for entrance   Can travel by private vehicle     No  Equipment Recommendations  Hospital bed;Hoyer lift    Recommendations for Other Services       Precautions / Restrictions       Mobility  Bed Mobility Overal bed mobility: Needs Assistance Bed Mobility: Supine to Sit                Transfers Overall transfer level: Needs assistance Equipment used: Ambulation equipment used Transfers: Sit to/from Stand, Bed to chair/wheelchair/BSC Sit to Stand: Mod assist, +2 physical assistance           General transfer comment: Improved bil knee mobility this session into full extension with increased time. Pt able to stand at stedy for peri care; decr Standing tolerance - able to  stand approx 10seconds before needing to sit down; 3 standing bouts; decr eccentric control of stand to sit Transfer via Lift Equipment: Stedy  Ambulation/Gait                   Stairs             Wheelchair Mobility     Tilt Bed    Modified Rankin (Stroke Patients Only)       Balance                                            Communication Communication Communication: Impaired Factors Affecting Communication: Difficulty expressing self;Reduced clarity of speech  Cognition                                        Cueing    Exercises      General Comments General comments (skin integrity, edema, etc.): Session conducted on Room Air; Noting incr use of accessory muscles to breathe and occasional cough; VSS      Pertinent Vitals/Pain      Home Living                          Prior Function            PT Goals (current goals can  now be found in the care plan section) Acute Rehab PT Goals PT Goal Formulation: With patient/family Time For Goal Achievement: 04/23/24 Potential to Achieve Goals: Fair Progress towards PT goals: Progressing toward goals    Frequency    Min 2X/week      PT Plan      Co-evaluation   Reason for Co-Treatment: Complexity of the patient's impairments (multi-system involvement)   OT goals addressed during session: ADL's and self-care      AM-PAC PT 6 Clicks Mobility   Outcome Measure  Help needed turning from your back to your side while in a flat bed without using bedrails?: A Lot Help needed moving from lying on your back to sitting on the side of a flat bed without using bedrails?: A Lot Help needed moving to and from a bed to a chair (including a wheelchair)?: A Lot Help needed standing up from a chair using your arms (e.g., wheelchair or bedside chair)?: Total Help needed to walk in hospital room?: Total Help needed climbing 3-5 steps with a railing? :  Total 6 Click Score: 9    End of Session Equipment Utilized During Treatment: Gait belt Activity Tolerance: Patient tolerated treatment well Patient left: in chair;with call bell/phone within reach;with chair alarm set;Other (comment) (lots of padding for comfort) Nurse Communication: Mobility status;Need for lift equipment (stedy) PT Visit Diagnosis: Unsteadiness on feet (R26.81);Other abnormalities of gait and mobility (R26.89);Muscle weakness (generalized) (M62.81);History of falling (Z91.81);Difficulty in walking, not elsewhere classified (R26.2)     Time: 9044-8975 PT Time Calculation (min) (ACUTE ONLY): 29 min  Charges:    $Therapeutic Activity: 8-22 mins PT General Charges $$ ACUTE PT VISIT: 1 Visit                     Silvano Currier, PT  Acute Rehabilitation Services Office [phone] Secure Chat welcomed    Silvano VEAR Currier 04/13/2024, 12:47 PM

## 2024-04-13 NOTE — Progress Notes (Signed)
 Occupational Therapy Treatment Patient Details Name: Ronald Reeves MRN: [id number] DOB: 1955/11/07 Today's Date: 04/13/2024   History of present illness Pt is a 68 yr old male who presented 04/07/24 due to AMS and admitted due to toxic metabolic encephalopathy. PMH includes ESRD on HD, anemia, arthritis,COPD, CAD, HFrEF, polysubstance abuse, HTN, GERD, BPH, afib.   OT comments  Pt presented in bed and noted to not want to talk to therapist much today. Pt completed bed mobility with mod x2 and sit to stand transfer with stedy with mod x2 x3 trials to be able to complete peri care with total assist while standing. Patient will benefit from continued inpatient follow up therapy, <3 hours/day.      If plan is discharge home, recommend the following:  Two people to help with walking and/or transfers;Two people to help with bathing/dressing/bathroom;Assistance with cooking/housework;Assist for transportation;Supervision due to cognitive status;Help with stairs or ramp for entrance   Equipment Recommendations  Other (comment) (TBD)    Recommendations for Other Services      Precautions / Restrictions Precautions Precautions: Fall Recall of Precautions/Restrictions: Impaired Restrictions Weight Bearing Restrictions Per Provider Order: No       Mobility Bed Mobility Overal bed mobility: Needs Assistance Bed Mobility: Supine to Sit     Supine to sit: Mod assist, +2 for physical assistance, +2 for safety/equipment          Transfers Overall transfer level: Needs assistance Equipment used: Ambulation equipment used Transfers: Sit to/from Stand, Bed to chair/wheelchair/BSC Sit to Stand: Mod assist, +2 physical assistance     Step pivot transfers: Mod assist, +2 physical assistance, +2 safety/equipment     General transfer comment: Improved bil knee mobility this session into full extension with increased time. Pt able to stand at stedy for peri care; decr Standing tolerance - able to  stand approx 10seconds before needing to sit down; 3 standing bouts; decr eccentric control of stand to sit Transfer via Lift Equipment: Stedy   Balance Overall balance assessment: Needs assistance Sitting-balance support: Bilateral upper extremity supported, Feet supported Sitting balance-Leahy Scale: Fair Sitting balance - Comments: CGA sitting EOB Postural control: Posterior lean Standing balance support: Bilateral upper extremity supported, Reliant on assistive device for balance Standing balance-Leahy Scale: Zero Standing balance comment: Requires 2 person Mod A to stay standing.                           ADL either performed or assessed with clinical judgement   ADL Overall ADL's : Needs assistance/impaired         Upper Body Bathing: Minimal assistance;Sitting   Lower Body Bathing: Maximal assistance;Total assistance;+2 for physical assistance;+2 for safety/equipment;Sitting/lateral leans;Sit to/from stand   Upper Body Dressing : Minimal assistance;Sitting   Lower Body Dressing: Maximal assistance;Total assistance;+2 for physical assistance;+2 for safety/equipment;Sit to/from stand       Toileting- Architect and Hygiene: Total assistance;+2 for physical assistance;+2 for safety/equipment;Sit to/from stand       Functional mobility during ADLs: Moderate assistance;+2 for physical assistance;+2 for safety/equipment;Rolling walker (2 wheels)      Extremity/Trunk Assessment Upper Extremity Assessment Upper Extremity Assessment: Generalized weakness   Lower Extremity Assessment Lower Extremity Assessment: Defer to PT evaluation        Vision       Perception     Praxis     Communication Communication Communication: Impaired Factors Affecting Communication: Difficulty expressing self;Reduced clarity of speech   Cognition Arousal:  Lethargic Behavior During Therapy: Flat affect Cognition: Cognition impaired   Orientation impairments:  Situation Awareness: Online awareness impaired, Intellectual awareness impaired Memory impairment (select all impairments): Short-term memory, Working memory Attention impairment (select first level of impairment): Selective attention Executive functioning impairment (select all impairments): Sequencing, Reasoning, Problem solving OT - Cognition Comments: Pt did not want to talk in session and just groaning besides saying name                 Following commands: Impaired Following commands impaired: Only follows one step commands consistently, Follows multi-step commands with increased time      Cueing   Cueing Techniques: Verbal cues, Gestural cues, Tactile cues  Exercises      Shoulder Instructions       General Comments      Pertinent Vitals/ Pain       Pain Assessment Pain Assessment: Faces Faces Pain Scale: Hurts a little bit Breathing: normal Negative Vocalization: occasional moan/groan, low speech, negative/disapproving quality Facial Expression: smiling or inexpressive Body Language: relaxed Consolability: no need to console PAINAD Score: 1 Pain Location: noted moving around in chair and bed to be comfortable Pain Descriptors / Indicators: Aching Pain Intervention(s): Limited activity within patient's tolerance, Monitored during session, Repositioned  Home Living                                          Prior Functioning/Environment              Frequency  Min 2X/week        Progress Toward Goals  OT Goals(current goals can now be found in the care plan section)  Progress towards OT goals: Progressing toward goals  Acute Rehab OT Goals Patient Stated Goal: none OT Goal Formulation: Patient unable to participate in goal setting Time For Goal Achievement: 04/23/24 Potential to Achieve Goals: Fair ADL Goals Pt Will Perform Grooming: with set-up;sitting Pt Will Perform Upper Body Bathing: with set-up;sitting Pt Will Perform  Lower Body Bathing: with mod assist Pt Will Perform Upper Body Dressing: with contact guard assist;sitting Pt Will Perform Lower Body Dressing: with mod assist;sit to/from stand Pt Will Transfer to Toilet: with max assist Pt Will Perform Toileting - Clothing Manipulation and hygiene: with max assist  Plan      Co-evaluation    PT/OT/SLP Co-Evaluation/Treatment: Yes Reason for Co-Treatment: Complexity of the patient's impairments (multi-system involvement)   OT goals addressed during session: ADL's and self-care      AM-PAC OT 6 Clicks Daily Activity     Outcome Measure   Help from another person eating meals?: A Little Help from another person taking care of personal grooming?: A Little Help from another person toileting, which includes using toliet, bedpan, or urinal?: Total Help from another person bathing (including washing, rinsing, drying)?: A Lot Help from another person to put on and taking off regular upper body clothing?: A Little Help from another person to put on and taking off regular lower body clothing?: A Lot 6 Click Score: 14    End of Session Equipment Utilized During Treatment: Gait belt  OT Visit Diagnosis: Unsteadiness on feet (R26.81);Other abnormalities of gait and mobility (R26.89);Repeated falls (R29.6);Muscle weakness (generalized) (M62.81);Pain Pain - part of body: Knee   Activity Tolerance Patient limited by fatigue   Patient Left in chair;with call bell/phone within reach;with chair alarm set   Nurse Communication Mobility  status        Time: 8995-8971 OT Time Calculation (min): 24 min  Charges: OT General Charges $OT Visit: 1 Visit OT Evaluation $OT Eval Low Complexity: 1 Low OT Treatments $Self Care/Home Management : 8-22 mins  Warrick POUR OTR/L  Acute Rehab Services  [phone] office number   [name] 04/13/2024, 12:16 PM

## 2024-04-13 NOTE — Progress Notes (Signed)
 Patient has had multiple loose bowel movements this shift with stool oozing out constantly at times. MD made aware and rectal tube requested by this nurse to prevent skin breakdown. MD informed this nurse rectal tube not advised.

## 2024-04-14 DIAGNOSIS — Z789 Other specified health status: Secondary | ICD-10-CM

## 2024-04-14 DIAGNOSIS — K7460 Unspecified cirrhosis of liver: Secondary | ICD-10-CM

## 2024-04-14 LAB — RENAL FUNCTION PANEL
Albumin: 2.1 g/dL — ABNORMAL LOW (ref 3.5–5.0)
Anion gap: 17 — ABNORMAL HIGH (ref 5–15)
BUN: 70 mg/dL — ABNORMAL HIGH (ref 8–23)
CO2: 28 mmol/L (ref 22–32)
Calcium: 8.9 mg/dL (ref 8.9–10.3)
Chloride: 91 mmol/L — ABNORMAL LOW (ref 98–111)
Creatinine, Ser: 5.59 mg/dL — ABNORMAL HIGH (ref 0.61–1.24)
GFR, Estimated: 11 mL/min — ABNORMAL LOW (ref >60)
Glucose, Bld: 148 mg/dL — ABNORMAL HIGH (ref 70–99)
Phosphorus: 4 mg/dL (ref 2.5–4.6)
Potassium: 4 mmol/L (ref 3.5–5.1)
Sodium: 136 mmol/L (ref 135–145)

## 2024-04-14 LAB — CBC
HCT: 36.2 % — ABNORMAL LOW (ref 39.0–52.0)
Hemoglobin: 12.2 g/dL — ABNORMAL LOW (ref 13.0–17.0)
MCH: 26.2 pg (ref 26.0–34.0)
MCHC: 33.7 g/dL (ref 30.0–36.0)
MCV: 77.8 fL — ABNORMAL LOW (ref 80.0–100.0)
Platelets: 161 K/uL (ref 150–400)
RBC: 4.65 MIL/uL (ref 4.22–5.81)
RDW: 17.2 % — ABNORMAL HIGH (ref 11.5–15.5)
WBC: 8 K/uL (ref 4.0–10.5)
nRBC: 0 % (ref 0.0–0.2)

## 2024-04-14 LAB — BENZODIAZEPINES,MS,WB/SP RFX
7-Aminoclonazepam: NEGATIVE ng/mL
Alprazolam: NEGATIVE ng/mL
Benzodiazepines Confirm: NEGATIVE
Chlordiazepoxide: NEGATIVE
Clonazepam: NEGATIVE ng/mL
Desalkylflurazepam: NEGATIVE ng/mL
Desmethylchlordiazepoxide: NEGATIVE
Desmethyldiazepam: NEGATIVE ng/mL
Diazepam: NEGATIVE ng/mL
Flurazepam: NEGATIVE ng/mL
Lorazepam: NEGATIVE ng/mL
Midazolam: NEGATIVE ng/mL
Oxazepam: NEGATIVE ng/mL
Temazepam: NEGATIVE ng/mL
Triazolam: NEGATIVE ng/mL

## 2024-04-14 LAB — GLUCOSE, CAPILLARY
Glucose-Capillary: 149 mg/dL — ABNORMAL HIGH (ref 70–99)
Glucose-Capillary: 140 mg/dL — ABNORMAL HIGH (ref 70–99)

## 2024-04-14 LAB — COCAINE,MS,WB/SP RFX
Benzoylecgonine: >1500 ng/mL
Cocaine Confirmation: POSITIVE
Cocaine: NEGATIVE ng/mL

## 2024-04-14 LAB — DRUG SCREEN 10 W/CONF, SERUM
Amphetamines, IA: NEGATIVE ng/mL
Barbiturates, IA: NEGATIVE ug/mL
Benzodiazepines, IA: NEGATIVE ng/mL
Cocaine & Metabolite, IA: POSITIVE ng/mL — AB
Methadone, IA: NEGATIVE ng/mL
Opiates, IA: NEGATIVE ng/mL
Oxycodones, IA: NEGATIVE ng/mL
Phencyclidine, IA: NEGATIVE ng/mL
Propoxyphene, IA: NEGATIVE ng/mL
THC(Marijuana) Metabolite, IA: POSITIVE ng/mL — AB

## 2024-04-14 LAB — THC,MS,WB/SP RFX
Cannabidiol: NEGATIVE
Cannabinoid Confirmation: POSITIVE
Carboxy-THC: 14.7 ng/mL
Hydroxy-THC: NEGATIVE ng/mL
Tetrahydrocannabinol(THC): NEGATIVE ng/mL

## 2024-04-14 LAB — MAGNESIUM: Magnesium: 2.1 mg/dL (ref 1.7–2.4)

## 2024-04-14 MED ORDER — HEPARIN SODIUM (PORCINE) 1000 UNIT/ML IJ SOLN
INTRAMUSCULAR | Status: AC
  Filled 2024-04-14: qty 4

## 2024-04-14 MED ORDER — LACTULOSE 10 GM/15ML PO SOLN
10.0000 g | Freq: Every day | ORAL | Status: DC
  Administered 2024-04-14 – 2024-04-15 (×2): 10 g via ORAL
  Filled 2024-04-14 (×2): qty 15

## 2024-04-14 MED ORDER — HEPARIN SODIUM (PORCINE) 1000 UNIT/ML DIALYSIS: 2000.0000 [IU] | Freq: Once | INTRAMUSCULAR | Status: DC

## 2024-04-14 NOTE — Progress Notes (Signed)
 [facility] Progress Note   Subjective:   Lots of hospice discussion this morning, then refusing HD today, but now agreeable to 2hr HD. No CP/dyspnea.  Objective Vitals:   04/13/24 1938 04/14/24 0512 04/14/24 0818 04/14/24 0835  BP:  106/71 123/74   Pulse:  91 88   Resp:  18 18   Temp:  98.1 F (36.7 C)    TempSrc:      SpO2: 95% 98% 99% 99%  Weight:      Height:       Physical Exam General: Frail man, NAD. Room air Heart: RRR Lungs: CTA anteriorly Abdomen: distended/tense Extremities: no LE edema Dialysis Access:  [facility]    Additional Objective Labs: Basic Metabolic Panel: Recent Labs  Lab 04/07/24 2015 04/08/24 0127 04/09/24 0345 04/10/24 0957 04/12/24 1949  NA 133* 134* 132* 134* 133*  K 4.9 4.8 5.5* 4.4 3.9  CL 94* 95* 93* 92* 94*  CO2 21* 22 23 27 27   GLUCOSE 52* 81 98 134* 107*  BUN 39* 44* 62* 46* 44*  CREATININE 4.07* 4.62* 5.68* 4.33* 3.88*  CALCIUM  9.1 8.9 9.5 9.3 9.0  PHOS 4.9* 6.0* 7.4*  --   --    Liver Function Tests: Recent Labs  Lab 04/07/24 1410 04/07/24 2015 04/08/24 0127 04/09/24 0345 04/12/24 1949  AST 25  --   --   --  21  ALT 25  --   --   --  18  ALKPHOS 113  --   --   --  110  BILITOT 0.7  --   --   --  0.7  PROT 6.5  --   --   --  6.0*  ALBUMIN  2.8*   < > 2.4* 2.4* 2.3*   < > = values in this interval not displayed.   CBC: Recent Labs  Lab 04/08/24 0127 04/09/24 0345 04/10/24 0957 04/12/24 0830 04/13/24 1841  WBC 4.2 5.5 5.3 6.4 10.8*  NEUTROABS 3.6  --  4.4  --   --   HGB 13.8 12.5* 13.1 12.3* 13.4  HCT 40.3 36.9* 38.8* 36.5* 39.6  MCV 78.3* 78.0* 78.1* 78.7* 78.9*  PLT 135* 129* 122* 115* 104*   Medications:   acetaminophen   1,000 mg Oral Q8H   apixaban   5 mg Oral BID   arformoterol   15 mcg Nebulization BID   And   umeclidinium bromide   1 puff Inhalation Daily   ARIPiprazole   5 mg Oral Daily   Chlorhexidine  Gluconate Cloth  6 each Topical Q0600   cinacalcet   30 mg Oral Q T,Th,Sa-HD    diclofenac  Sodium  2 g Topical QID   feeding supplement (NEPRO CARB STEADY)  237 mL Oral BID BM   lactulose   10 g Oral Daily   lidocaine   1 patch Transdermal Q24H   nicotine   14 mg Transdermal Q0600   sevelamer  carbonate  1,600 mg Oral TID WC   thiamine   100 mg Oral Daily    Dialysis Orders [facility] TTS 4:00 BFR 400 EDW 62 kg 2K/2Ca TDC - Heparin  4000 + 2000 midrun  - calcitriol  2.25 q HD, sensipar  30 q HD   Assessment/Plan: Hyperkalemia: S/p urgent HD on admit, improving. AMS: Waxes/wanes.Liver dz playing role. ESRD: Usual TTS schedule - HD today, he agrees for 2hr only. HTN/volume: BP good, now well prior prior EDW - lower on discharge. Anemia of ESRD: Hgb > 12, no ESA needed Secondary HPTH: CorrCa and Phos high - holding VDRA, continue sensipar  and binders (  sevelamer ) Nutrition: Alb low, continue supps. HFrEF pA-fib: On Eliquis  Diarrhea: Per primary. Substance abuse disorder Dispo: Agreeable to hospice services this AM, either home or SNF TBD.   Izetta Boehringer, PA-C 04/14/2024, 10:58 AM  [employer]

## 2024-04-14 NOTE — TOC Progression Note (Addendum)
 Transition of Care [facility]) - Progression Note    Patient Details  Name: Ronald Reeves MRN: [id number] Date of Birth: [birth year]  Transition of Care [facility]) CM/SW Contact  Lauraine FORBES Saa, LCSWA Phone Number: 04/14/2024, 10:06 AM  Clinical Narrative:     10:06 AM Per Palliative Care NP, patient expressed interest in discharging home with hospice, but family has stated that they may not be able to assist in providing care at home. Patient plans to receive outpatient HD with home hospice. Palliative Care NP informed TOC that [facility] is only home hospice agency that can admit patients with home hospice and outpatient HD. RNCM informed this CSW and Palliative Care NP that [facility] can accept patient. Nephrology PA, MD Resident, and bedside RN were made aware of patient's preferences and current disposition plan pending home support. GOC ongoing with medical team, patient's family, and patient as patient is fully oriented. TOC will continue to follow and be available to assist.  Expected Discharge Plan: Skilled Nursing Facility Barriers to Discharge: Continued Medical Work up, English as a second language teacher, Engineer, mining)               Expected Discharge Plan and Services In-house Referral: Clinical Social Work   Post Acute Care Choice: Skilled Nursing Facility Living arrangements for the past 2 months: Single Family Home                                       Social Drivers of Health (SDOH) Interventions SDOH Screenings   Food Insecurity: No Food Insecurity (04/07/2024)  Housing: Low Risk  (04/07/2024)  Transportation Needs: Unmet Transportation Needs (04/07/2024)  Utilities: Not At Risk (04/07/2024)  Financial Resource Strain: Low Risk  (02/25/2024)  Social Connections: Moderately Integrated (04/07/2024)  Stress: No Stress Concern Present (05/22/2023)   Received from [facility]  Tobacco Use: High Risk (04/07/2024)  Health Literacy: Adequate Health Literacy  (02/25/2024)    Readmission Risk Interventions     No data to display

## 2024-04-14 NOTE — Plan of Care (Signed)

## 2024-04-14 NOTE — Progress Notes (Signed)
 HD#6 SUBJECTIVE:  Patient Summary: Ronald Reeves is a 68 y.o. with a pertinent PMH of ESRD on HD TTS, HTN, paroxysmal a-fib on eliquis , HFrEF, prior CVA, and polysubstance use disorder, who presented with altered mental status and admitted for Toxic Metabolic Encephalopathy.   Overnight Events: No acute events overnight  Interim History: The pt was seen and evaluated at the bedside this morning. He was much more alert and requested for me to dial  his son's phone number for him. He is in less pain today and feeling much better.   OBJECTIVE:  Vital Signs: Vitals:   04/13/24 1938 04/14/24 0512 04/14/24 0818 04/14/24 0835  BP:  106/71 123/74   Pulse:  91 88   Resp:  18 18   Temp:  98.1 F (36.7 C)    TempSrc:      SpO2: 95% 98% 99% 99%  Weight:      Height:       Supplemental O2: Room Air SpO2: 99 % O2 Flow Rate (L/min): 2 L/min  Filed Weights   04/12/24 0852 04/12/24 1223 04/13/24 0454  Weight: 54.8 kg 56.9 kg 52.1 kg    No intake or output data in the 24 hours ending 04/14/24 1202  Net IO Since Admission: -8,316.01 mL [04/14/24 1202]  Physical Exam: Const: Awake, alert in NAD, cachetic appearing HENT: Normocephalic, atraumatic, mucus membranes moist Card: RRR, No MRG, No pitting edema on LE's bilaterally  Resp: LCTAB, no increased work of breathing Abd: Distended and tympanic. NT Extremities: Warm, dry with skin tenting Neuro: No focal deficits noted   Patient Lines/Drains/Airways Status     Active Line/Drains/Airways     Name Placement date Placement time Site Days   Peripheral IV 20 G Anterior;Left;Upper Arm --  --  Arm  --   Fistula / Graft Right Upper arm Arteriovenous fistula 11/16/23  0000  Upper arm  144   Hemodialysis Catheter Right Internal jugular Double lumen Permanent (Tunneled) 01/21/24  0853  Internal jugular  78            Pertinent labs and imaging:      Latest Ref Rng & Units 04/13/2024    6:41 PM 04/12/2024    8:30 AM 04/10/2024    9:57 AM   CBC  WBC 4.0 - 10.5 K/uL 10.8  6.4  5.3   Hemoglobin 13.0 - 17.0 g/dL 86.5  87.6  86.8   Hematocrit 39.0 - 52.0 % 39.6  36.5  38.8   Platelets 150 - 400 K/uL 104  115  122        Latest Ref Rng & Units 04/12/2024    7:49 PM 04/10/2024    9:57 AM 04/09/2024    3:45 AM  CMP  Glucose 70 - 99 mg/dL 892  865  98   BUN 8 - 23 mg/dL 44  46  62   Creatinine 0.61 - 1.24 mg/dL 6.11  5.66  4.31   Sodium 135 - 145 mmol/L 133  134  132   Potassium 3.5 - 5.1 mmol/L 3.9  4.4  5.5   Chloride 98 - 111 mmol/L 94  92  93   CO2 22 - 32 mmol/L 27  27  23    Calcium  8.9 - 10.3 mg/dL 9.0  9.3  9.5   Total Protein 6.5 - 8.1 g/dL 6.0     Total Bilirubin 0.0 - 1.2 mg/dL 0.7     Alkaline Phos 38 - 126 U/L 110     AST  15 - 41 U/L 21     ALT 0 - 44 U/L 18       No results found.  ASSESSMENT/PLAN:  Assessment: Principal Problem:   Acute metabolic encephalopathy Active Problems:   Essential hypertension   Alcohol use disorder, moderate, in sustained remission (HCC)   Cocaine  use disorder, severe, dependence (HCC)   ESRD (end stage renal disease) on dialysis TTS schedule (HCC)   HFrEF (heart failure with reduced ejection fraction) (HCC)   MDD (major depressive disorder), recurrent severe, without psychosis (HCC)   Paroxysmal atrial fibrillation (HCC)   Thrombocytopenia (HCC)   Ascites   Decreased mobility   Diarrhea   Plan: #Toxic metabolic encephalopathy #Hx of Cocaine  Use #?Hepatic encephalopathy  -The patient presented with increased encephalopathy. He does use cocaine  daily, with his last use 8/27 --Initially concerned that acute encephalopathy was due to cocaine  withdrawal, however with prolonged hospitalization and minimal improvement of symptoms, concern for other etiologies.  -BC NGTD -Pt continues to improve after lactulose  initiation. Today, he is A&Ox4. Continue lactulose  once daily  #Cirrhosis -Pt with documented hx of cirrhosis  -RUQ U/S with perihepatic ascites and patent portal  venous system. -POCUS with very small pocket of ascitic fluid in LLQ.  -No acute concern for SBP at this time. -Continue lactulose   #Multisystem organ failure #Goals of Care -Palliative care consulted and saw pt today, appreciate their assistance. The patient continues to desire going home and would not like to go to SNF. He would like to proceed with hospice, but while continuing hemodialysis. -Discussed with the family who have concerns about being able to care for him at home.  -The pt was accepted by [facility] home hospice. -Plan for family meeting tomorrow to discuss further plans moving forward.   #ESRD on HD TTS #Anion gap metabolic acidosis #electrolyte derangements -Stable -Continue to follow nephrology's recommendations--continue TTS schedule  #HFrEF #HTN -Last echo from 1/25 shows LVEF 20-25% with decreased LV function and grade 3 diastolic dysfunction.  -Currently without exacerbation -Pt noncompliant to GDMT   #Paroxysmal a-fib -Pt in NSR today -Continue eliquis  5mg  BID  #Rigth arm weakness #Prior CVA -CT head this admission without acute abnormality. -No focal weakness noted on exam.   #Psychiatric disorder -Pt's mentation significantly improving.  -Continue abilify  5mg  BID  Best Practice: Diet: Renal diet IVF: Fluids: none, Rate: None VTE: eliquis  Code: Full  Disposition planning: Therapy Recs: None, DME: none DISPO: Anticipated discharge pending to Home pending clinical improvement.  Signature:  [name], DO [facility]  12:02 PM, 04/14/2024  On Call pager [phone]

## 2024-04-14 NOTE — Progress Notes (Signed)
 Daily Progress Note   Patient Name: Ronald Reeves       Date: 04/14/2024 DOB: 1955-08-13  Age: 68 y.o. MRN#: [id number] Attending Physician: Shawn Sick, MD Primary Care Physician: [facility] Admit Date: 04/07/2024  Reason for Consultation/Follow-up: Establishing goals of care  Subjective: I have reviewed medical records including EPIC notes, MAR, and labs - BNP, CBC. Unable to receive report from primary RN.   Went to visit patient at bedside - no family/visitors present. Patient was lying in bed awake, alert, oriented x4, and able to participate in conversation. No signs or non-verbal gestures of pain or discomfort noted. No respiratory distress, increased work of breathing, or secretions noted. Patient is ill and chronically frail appearing.   Emotional support provided to patient. He has a clear understanding of his current acute medical situation in context of his multiorgan system failure. He is clear in his desire to return home with hospice. He does not want rehospitalization. His end of life wishes are to die at home surrounded by his family and his bulldog. He becomes tearful when explaining how important his family is to him. He tells me, if I'm going to die, I want to lay in my own bed and be with my wife and children.  Reviewed that most hospice organizations will not accept him while still receiving dialysis; however, could explore option of [facility]. Patient wants to continue dialysis as long as I can and is not ready to stop at this time. Due to this, he would not be a candidate for inpatient hospice.   Called patient's wife/Ronald Reeves and spoke with her via speakerphone with patient present. Ronald Reeves is hopeful for patient's discharge to SNF rehab so he can regain strength to  return home. Ronald Reeves is concerned she and family will not be able to provide the care he will need at home, even with hospice support. Discussed that patient will likely not benefit from rehab as it is expected he will continue to decline with high risk of rehospitalization. Patient is clear speaking to his wife, that he does not want discharge to rehab facility, he wishes to return home with hospice. He states, home will make me happier.  Prognosis reviewed with family to likely be weeks. They understand patient is at end of life.  Son/Ronald Reeves also joined phone call via  speakerphone (he was present with Ronald Reeves). Allowed space and time for family and patient to discuss information, patient's wishes, and family concerns.   There were still no resolution or decisions regarding discharge: patient wants to go home and family state they cannot support him. Offered family meeting pending when patient is discharged - if he is discharged this afternoon to rehab facility after HD as currently planned, will need outpatient Palliative Care referral for ongoing discussions.   Patient requests a pureed diet.  Planned to speak with TOC to see if home hospice + HD is even an option.    Discussed case with attending team, TOC, and primary RN at length. Per TOC, [facility] will accept patient for home hospice while receiving HD.   10:30 AM Notified by primary RN that patient's son was at bedside.  10:50 AM Returned to patient's room to try and continue discussions with patient and son, knowing home hospice is an option - son was not present. Attempted to call [name] without success.  11:07 AM Called wife/Ronald Reeves - she is currently on the way to the ED herself due to personal medical concerns - she is not able to continue discussions today.  Continued to discuss case with care team in detail regarding complex case. Patient cannot discharge to rehab if decisional and not agreeable. Attending team will visit  with patient tomorrow to discuss options of rehab vs home with hospice despite lack of family support.  Length of Stay: 6  Current Medications: Scheduled Meds:   acetaminophen   1,000 mg Oral Q8H   apixaban   5 mg Oral BID   arformoterol   15 mcg Nebulization BID   And   umeclidinium bromide   1 puff Inhalation Daily   ARIPiprazole   5 mg Oral Daily   Chlorhexidine  Gluconate Cloth  6 each Topical Q0600   cinacalcet   30 mg Oral Q T,Th,Sa-HD   diclofenac  Sodium  2 g Topical QID   feeding supplement (NEPRO CARB STEADY)  237 mL Oral BID BM   lidocaine   1 patch Transdermal Q24H   nicotine   14 mg Transdermal Q0600   sevelamer  carbonate  1,600 mg Oral TID WC   thiamine   100 mg Oral Daily    Continuous Infusions:   PRN Meds: albuterol , oxyCODONE , sodium chloride  flush  Physical Exam Vitals and nursing note reviewed.  Constitutional:      General: He is not in acute distress.    Appearance: He is cachectic. He is ill-appearing.  Pulmonary:     Effort: No respiratory distress.  Skin:    General: Skin is warm and dry.  Neurological:     Mental Status: He is alert and oriented to person, place, and time.     Motor: Weakness present.  Psychiatric:        Attention and Perception: Attention normal.        Mood and Affect: Affect is tearful.        Behavior: Behavior is cooperative.        Cognition and Memory: Cognition and memory normal.             Vital Signs: BP 123/74 (BP Location: Left Arm)   Pulse 88   Temp 98.1 F (36.7 C)   Resp 18   Ht 5' 11 (1.803 m)   Wt 52.1 kg   SpO2 99%   BMI 16.02 kg/m  SpO2: SpO2: 99 % O2 Device: O2 Device: Room Air O2 Flow Rate: O2 Flow Rate (L/min): 2 L/min  Intake/output  summary: No intake or output data in the 24 hours ending 04/14/24 0840 LBM: Last BM Date : 04/13/24 Baseline Weight: Weight: 72 kg Most recent weight: Weight: 52.1 kg       Palliative Assessment/Data: PPS 20%      Patient Active Problem List   Diagnosis  Date Noted   Diarrhea 04/10/2024   Ascites 04/09/2024   Decreased mobility 04/09/2024   Thrombocytopenia (HCC) 04/08/2024   Clotted renal dialysis AV graft (HCC) 12/09/2023   Pancytopenia (HCC) 12/09/2023   Hyperlipidemia 12/09/2023   Arteriovenous fistula occlusion (HCC) 12/07/2023   Acute on chronic systolic (congestive) heart failure (HCC) 11/16/2023   Suicidal ideation 11/16/2023   History of depression 11/16/2023   Paroxysmal atrial fibrillation (HCC) 11/16/2023   MDD (major depressive disorder), recurrent severe, without psychosis (HCC) 11/15/2023   Hepatitis B immune 09/07/2023   COPD exacerbation (HCC) 09/04/2023   Acute exacerbation of CHF (congestive heart failure) (HCC) 09/04/2023   Hypervolemia 07/08/2023   Acute hypoxic respiratory failure (HCC) 07/08/2023   Acute hypoxemic respiratory failure (HCC) 07/08/2023   Acute pulmonary edema (HCC) 07/08/2023   Gabapentin -induced toxicity 03/19/2023   ESRD (end stage renal disease) (HCC) 03/19/2023   Acute metabolic encephalopathy 03/14/2023   Elevated troponin 03/14/2023   HFrEF (heart failure with reduced ejection fraction) (HCC) 03/14/2023   Cocaine  use 03/14/2023   History of DVT (deep vein thrombosis) 03/14/2023   History of CVA (cerebrovascular accident) 03/14/2023   Displacement of vascular dialysis catheter (HCC) 03/05/2023   Acute on chronic HFrEF (heart failure with reduced ejection fraction) (HCC) 12/17/2022   Enterococcal bacteremia 12/17/2022   Orthostatic hypotension 12/06/2022   Polyp of sigmoid colon 08/20/2022   Diverticulosis of colon without hemorrhage 08/20/2022   Change in stool habits 08/20/2022   Gastritis and gastroduodenitis 08/20/2022   Nausea and vomiting 08/20/2022   GERD without esophagitis 08/20/2022   Failure to thrive in adult 06/17/2022   ESRD (end stage renal disease) on dialysis TTS schedule (HCC) 01/07/2022   Alcohol use disorder, moderate, in sustained remission (HCC) 10/14/2021    Cervicalgia 10/14/2021   Cocaine  use disorder, severe, dependence (HCC) 10/14/2021   Noncompliance with medication regimen 10/14/2021   Knee pain 10/14/2021   Osteoarthritis 10/14/2021   Tobacco abuse 10/14/2021   Acute upper respiratory infection, unspecified 10/14/2021   Age-related nuclear cataract, bilateral 10/14/2021   Benign prostatic hyperplasia 10/14/2021   Cannabis use disorder, severe, dependence (HCC) 10/14/2021   Combinations of drug dependence excluding opioid type drug, abuse (HCC) 10/14/2021   Coronary artery disease 10/14/2021   COVID-19 10/14/2021   SOB (shortness of breath) 10/14/2021   Elevated PSA 10/14/2021   Encounter for screening for malignant neoplasm of respiratory organs 10/14/2021   Injury, other and unspecified, finger 10/14/2021   Left ventricular hypertrophy 10/14/2021   Legal circumstance 10/14/2021   HLD (hyperlipidemia) 10/14/2021   Pain in right foot 10/14/2021   Peripheral edema 10/14/2021   Polysubstance abuse (HCC) 10/14/2021   Prediabetes 10/14/2021   Recurrent major depressive disorder (HCC) 10/14/2021   Thoracic or lumbosacral neuritis or radiculitis 10/14/2021   Vitamin D deficiency 10/14/2021   ARF (acute renal failure) (HCC) 07/18/2020   Anemia of chronic disease 07/18/2020   Proteinuria 01/25/2020   Chest pain 08/20/2017   Chronic back pain 08/20/2017   Cervical spondylosis without myelopathy 08/05/2017   Neural foraminal stenosis of cervical spine 08/05/2017   Peripheral neuropathy 05/30/2013   Chronic kidney disease 05/30/2013   Essential hypertension 05/30/2013   Cervical radiculopathy 05/30/2013  Acute exacerbation of chronic low back pain 05/30/2013   Dental caries 05/30/2013   Gastroesophageal reflux disease 08/12/2011    Palliative Care Assessment & Plan   Patient Profile: 68 y.o. male  with past medical history of ESRD on HD, cocaine  use disorder, recurrent DVT on Eliquis , HFrEF (EF 20-25%), prior CVA, cirrhosis,  unspecified psychiatric/mood disorder was admitted on 04/07/2024 s/p fall at home with toxic metabolic encephalopathy, hyperkalemia, anion gap metabolic acidosis, leukopenia.    Of note, patient has had 5 admissions and 14 ED visits in the last 6 months.  Assessment: Principal Problem:   Acute metabolic encephalopathy Active Problems:   Essential hypertension   Alcohol use disorder, moderate, in sustained remission (HCC)   Cocaine  use disorder, severe, dependence (HCC)   ESRD (end stage renal disease) on dialysis TTS schedule (HCC)   HFrEF (heart failure with reduced ejection fraction) (HCC)   MDD (major depressive disorder), recurrent severe, without psychosis (HCC)   Paroxysmal atrial fibrillation (HCC)   Thrombocytopenia (HCC)   Ascites   Decreased mobility   Diarrhea   Recommendations/Plan: Patient wishes to return home with hospice while continuing hemodialysis -  [facility] hosp[facility] will accept him while receiving HD Patient is adamant he does not want discharge to SNF rehab Family express concerns they are unable to provide the appropriate care for him at home Attending team plans to follow up with family tomorrow regarding possible discharge home with hospice Diet changed to Dys 1 per patient request PMT will continue to follow peripherally. If there are any imminent needs please call the service directly  Goals of Care and Additional Recommendations: Limitations on Scope of Treatment: Full Scope Treatment  Code Status:    Code Status Orders  (From admission, onward)           Start     Ordered   04/12/24 1409  Do not attempt resuscitation (DNR) Pre-Arrest Interventions Desired  (Code Status)  Continuous       Question Answer Comment  If pulseless and not breathing No CPR or chest compressions.   In Pre-Arrest Conditions (Patient Has Pulse and Is Breathing) May intubate, use advanced airway interventions and cardioversion/ACLS medications if appropriate or indicated.  May transfer to ICU.   Consent: Discussion documented in EHR or advanced directives reviewed      04/12/24 1409           Code Status History     Date Active Date Inactive Code Status Order ID Comments User Context   04/07/2024 1216 04/12/2024 1409 Full Code [id number]  Jolaine Pac, DO ED   12/09/2023 0815 12/10/2023 0256 Full Code [id number]  Claudene Maximino LABOR, MD ED   12/07/2023 1437 12/08/2023 0154 Full Code [id number]  Georgina Basket, MD ED   11/16/2023 0416 11/19/2023 1907 Full Code [id number]  Howerter, Eva NOVAK, DO ED   11/15/2023 1426 11/16/2023 0416 Full Code [id number]  Emil Share, DO ED   10/19/2023 0959 10/20/2023 0027 Full Code [id number]  Bethanie Donnice RIGGERS Inpatient   09/04/2023 0845 09/05/2023 0019 Full Code [id number]  Addie Perkins, DO ED   07/08/2023 1244 07/09/2023 0232 Full Code [id number]  Rosan Deward ORN, NP ED   07/08/2023 1037 07/08/2023 1244 Full Code [id number]  Kenn Pagan, DO ED   03/19/2023 0258 03/19/2023 2250 Full Code [id number]  Jolaine Pac, DO ED   03/14/2023 0326 03/14/2023 1430 Full Code [id number]  Lee Kingfisher, MD ED   03/05/2023 1139 03/06/2023 1005 Full Code [id number]  Fernand Prost, MD ED  12/18/2022 1624 12/20/2022 1334 Full Code [id number]  Kathrin Mignon DASEN, MD ED   12/17/2022 0222 12/18/2022 1437 Full Code [id number]  Marcene Eva NOVAK, DO ED   12/06/2022 2315 12/07/2022 1735 Full Code [id number]  Lonzell Emeline HERO, DO ED   07/18/2020 0342 07/18/2020 1701 Full Code [id number]  Franky Redia SAILOR, MD ED   08/20/2017 1620 08/22/2017 1652 Full Code [id number]  Alm Maxwell LABOR, MD Inpatient       Prognosis:  < 2 weeks  Discharge Planning: Home with Hospice  Care plan was discussed with primary RN, patient, patient's family, attending team, nephrology/Katheryn Stoval-PA, TOC  Thank you for allowing the Palliative Medicine Team to assist in the care of this patient.   Total Time 120 minutes Prolonged Time Billed  yes       Jeoffrey HERO Sharps, NP  Please contact Palliative  Medicine Team phone at [phone] for questions and concerns.   *Portions of this note are a verbal dictation therefore any spelling and/or grammatical errors are due to the Dragon [employer] One system interpretation.

## 2024-04-15 ENCOUNTER — Other Ambulatory Visit (HOSPITAL_COMMUNITY): Payer: Self-pay

## 2024-04-15 ENCOUNTER — Inpatient Hospital Stay (HOSPITAL_COMMUNITY)

## 2024-04-15 ENCOUNTER — Emergency Department (HOSPITAL_COMMUNITY)

## 2024-04-15 ENCOUNTER — Inpatient Hospital Stay (HOSPITAL_COMMUNITY)
Admission: EM | Admit: 2024-04-15 | Discharge: 2024-05-11 | DRG: 208 | Disposition: E | Attending: Pulmonary Disease | Admitting: Pulmonary Disease

## 2024-04-15 DIAGNOSIS — N186 End stage renal disease: Secondary | ICD-10-CM | POA: Diagnosis present

## 2024-04-15 DIAGNOSIS — R042 Hemoptysis: Secondary | ICD-10-CM | POA: Diagnosis present

## 2024-04-15 DIAGNOSIS — Z681 Body mass index (BMI) 19 or less, adult: Secondary | ICD-10-CM | POA: Diagnosis not present

## 2024-04-15 DIAGNOSIS — F1410 Cocaine abuse, uncomplicated: Secondary | ICD-10-CM

## 2024-04-15 DIAGNOSIS — I5020 Unspecified systolic (congestive) heart failure: Secondary | ICD-10-CM | POA: Diagnosis present

## 2024-04-15 DIAGNOSIS — E440 Moderate protein-calorie malnutrition: Secondary | ICD-10-CM | POA: Diagnosis present

## 2024-04-15 DIAGNOSIS — F1010 Alcohol abuse, uncomplicated: Secondary | ICD-10-CM | POA: Diagnosis present

## 2024-04-15 DIAGNOSIS — Z86718 Personal history of other venous thrombosis and embolism: Secondary | ICD-10-CM | POA: Diagnosis not present

## 2024-04-15 DIAGNOSIS — Z992 Dependence on renal dialysis: Secondary | ICD-10-CM

## 2024-04-15 DIAGNOSIS — Z8701 Personal history of pneumonia (recurrent): Secondary | ICD-10-CM

## 2024-04-15 DIAGNOSIS — I480 Paroxysmal atrial fibrillation: Secondary | ICD-10-CM | POA: Diagnosis present

## 2024-04-15 DIAGNOSIS — H5704 Mydriasis: Secondary | ICD-10-CM | POA: Diagnosis present

## 2024-04-15 DIAGNOSIS — I2510 Atherosclerotic heart disease of native coronary artery without angina pectoris: Secondary | ICD-10-CM | POA: Diagnosis present

## 2024-04-15 DIAGNOSIS — R0603 Acute respiratory distress: Principal | ICD-10-CM

## 2024-04-15 DIAGNOSIS — Z515 Encounter for palliative care: Secondary | ICD-10-CM | POA: Diagnosis not present

## 2024-04-15 DIAGNOSIS — E88A Wasting disease (syndrome) due to underlying condition: Secondary | ICD-10-CM | POA: Diagnosis not present

## 2024-04-15 DIAGNOSIS — I5022 Chronic systolic (congestive) heart failure: Secondary | ICD-10-CM | POA: Diagnosis present

## 2024-04-15 DIAGNOSIS — Z8673 Personal history of transient ischemic attack (TIA), and cerebral infarction without residual deficits: Secondary | ICD-10-CM

## 2024-04-15 DIAGNOSIS — K7682 Hepatic encephalopathy: Secondary | ICD-10-CM | POA: Diagnosis present

## 2024-04-15 DIAGNOSIS — R64 Cachexia: Secondary | ICD-10-CM | POA: Diagnosis present

## 2024-04-15 DIAGNOSIS — I959 Hypotension, unspecified: Secondary | ICD-10-CM | POA: Diagnosis not present

## 2024-04-15 DIAGNOSIS — Z8249 Family history of ischemic heart disease and other diseases of the circulatory system: Secondary | ICD-10-CM | POA: Diagnosis not present

## 2024-04-15 DIAGNOSIS — Z66 Do not resuscitate: Secondary | ICD-10-CM | POA: Diagnosis present

## 2024-04-15 DIAGNOSIS — E875 Hyperkalemia: Secondary | ICD-10-CM | POA: Diagnosis not present

## 2024-04-15 DIAGNOSIS — G928 Other toxic encephalopathy: Secondary | ICD-10-CM | POA: Diagnosis not present

## 2024-04-15 DIAGNOSIS — J9601 Acute respiratory failure with hypoxia: Secondary | ICD-10-CM | POA: Diagnosis not present

## 2024-04-15 DIAGNOSIS — F17210 Nicotine dependence, cigarettes, uncomplicated: Secondary | ICD-10-CM | POA: Diagnosis present

## 2024-04-15 DIAGNOSIS — F17290 Nicotine dependence, other tobacco product, uncomplicated: Secondary | ICD-10-CM | POA: Diagnosis present

## 2024-04-15 DIAGNOSIS — I2489 Other forms of acute ischemic heart disease: Secondary | ICD-10-CM | POA: Diagnosis present

## 2024-04-15 DIAGNOSIS — J449 Chronic obstructive pulmonary disease, unspecified: Secondary | ICD-10-CM | POA: Diagnosis present

## 2024-04-15 DIAGNOSIS — E162 Hypoglycemia, unspecified: Secondary | ICD-10-CM | POA: Diagnosis not present

## 2024-04-15 DIAGNOSIS — Z8616 Personal history of COVID-19: Secondary | ICD-10-CM | POA: Diagnosis not present

## 2024-04-15 DIAGNOSIS — D696 Thrombocytopenia, unspecified: Secondary | ICD-10-CM | POA: Diagnosis present

## 2024-04-15 DIAGNOSIS — K7031 Alcoholic cirrhosis of liver with ascites: Secondary | ICD-10-CM

## 2024-04-15 DIAGNOSIS — I132 Hypertensive heart and chronic kidney disease with heart failure and with stage 5 chronic kidney disease, or end stage renal disease: Secondary | ICD-10-CM | POA: Diagnosis present

## 2024-04-15 DIAGNOSIS — F1490 Cocaine use, unspecified, uncomplicated: Secondary | ICD-10-CM | POA: Diagnosis not present

## 2024-04-15 DIAGNOSIS — K219 Gastro-esophageal reflux disease without esophagitis: Secondary | ICD-10-CM | POA: Diagnosis present

## 2024-04-15 DIAGNOSIS — K7460 Unspecified cirrhosis of liver: Secondary | ICD-10-CM | POA: Diagnosis present

## 2024-04-15 DIAGNOSIS — I252 Old myocardial infarction: Secondary | ICD-10-CM

## 2024-04-15 DIAGNOSIS — R579 Shock, unspecified: Secondary | ICD-10-CM | POA: Diagnosis present

## 2024-04-15 DIAGNOSIS — R54 Age-related physical debility: Secondary | ICD-10-CM | POA: Diagnosis present

## 2024-04-15 DIAGNOSIS — J9600 Acute respiratory failure, unspecified whether with hypoxia or hypercapnia: Principal | ICD-10-CM

## 2024-04-15 LAB — CBC WITH DIFFERENTIAL/PLATELET
Basophils Absolute: 0 K/uL (ref 0.0–0.1)
Basophils Relative: 0 %
Eosinophils Absolute: 0 K/uL (ref 0.0–0.5)
Eosinophils Relative: 0 %
HCT: 37.8 % — ABNORMAL LOW (ref 39.0–52.0)
Hemoglobin: 12.6 g/dL — ABNORMAL LOW (ref 13.0–17.0)
Lymphocytes Relative: 1 %
Lymphs Abs: 0.1 K/uL — ABNORMAL LOW (ref 0.7–4.0)
MCH: 26.2 pg (ref 26.0–34.0)
MCHC: 33.3 g/dL (ref 30.0–36.0)
MCV: 78.6 fL — ABNORMAL LOW (ref 80.0–100.0)
Monocytes Absolute: 0.5 K/uL (ref 0.1–1.0)
Monocytes Relative: 7 %
Neutro Abs: 6.2 K/uL (ref 1.7–7.7)
Neutrophils Relative %: 92 %
Platelets: 162 K/uL (ref 150–400)
RBC: 4.81 MIL/uL (ref 4.22–5.81)
RDW: 17.2 % — ABNORMAL HIGH (ref 11.5–15.5)
WBC: 6.7 K/uL (ref 4.0–10.5)
nRBC: 0.3 % — ABNORMAL HIGH (ref 0.0–0.2)

## 2024-04-15 LAB — COMPREHENSIVE METABOLIC PANEL WITH GFR
ALT: 14 U/L (ref 0–44)
AST: 17 U/L (ref 15–41)
Albumin: 2 g/dL — ABNORMAL LOW (ref 3.5–5.0)
Alkaline Phosphatase: 107 U/L (ref 38–126)
Anion gap: 15 (ref 5–15)
BUN: 75 mg/dL — ABNORMAL HIGH (ref 8–23)
CO2: 26 mmol/L (ref 22–32)
Calcium: 9 mg/dL (ref 8.9–10.3)
Chloride: 93 mmol/L — ABNORMAL LOW (ref 98–111)
Creatinine, Ser: 5.58 mg/dL — ABNORMAL HIGH (ref 0.61–1.24)
GFR, Estimated: 11 mL/min — ABNORMAL LOW (ref >60)
Glucose, Bld: 86 mg/dL (ref 70–99)
Potassium: 4.7 mmol/L (ref 3.5–5.1)
Sodium: 134 mmol/L — ABNORMAL LOW (ref 135–145)
Total Bilirubin: 0.9 mg/dL (ref 0.0–1.2)
Total Protein: 6.2 g/dL — ABNORMAL LOW (ref 6.5–8.1)

## 2024-04-15 LAB — I-STAT ARTERIAL BLOOD GAS, ED
Acid-Base Excess: 4 mmol/L — ABNORMAL HIGH (ref 0.0–2.0)
Acid-Base Excess: 3 mmol/L — ABNORMAL HIGH (ref 0.0–2.0)
Bicarbonate: 28.4 mmol/L — ABNORMAL HIGH (ref 20.0–28.0)
Bicarbonate: 28.2 mmol/L — ABNORMAL HIGH (ref 20.0–28.0)
Calcium, Ion: 1.14 mmol/L — ABNORMAL LOW (ref 1.15–1.40)
Calcium, Ion: 1.16 mmol/L (ref 1.15–1.40)
HCT: 40 % (ref 39.0–52.0)
HCT: 40 % (ref 39.0–52.0)
Hemoglobin: 13.6 g/dL (ref 13.0–17.0)
Hemoglobin: 13.6 g/dL (ref 13.0–17.0)
O2 Saturation: 100 %
O2 Saturation: 100 %
Patient temperature: 98
Patient temperature: 98
Potassium: 4.6 mmol/L (ref 3.5–5.1)
Potassium: 4.6 mmol/L (ref 3.5–5.1)
Sodium: 131 mmol/L — ABNORMAL LOW (ref 135–145)
Sodium: 131 mmol/L — ABNORMAL LOW (ref 135–145)
TCO2: 30 mmol/L (ref 22–32)
TCO2: 30 mmol/L (ref 22–32)
pCO2 arterial: 41.7 mmHg (ref 32–48)
pCO2 arterial: 44.9 mmHg (ref 32–48)
pH, Arterial: 7.44 (ref 7.35–7.45)
pH, Arterial: 7.405 (ref 7.35–7.45)
pO2, Arterial: 248 mmHg — ABNORMAL HIGH (ref 83–108)
pO2, Arterial: 447 mmHg — ABNORMAL HIGH (ref 83–108)

## 2024-04-15 LAB — I-STAT CHEM 8, ED
BUN: 66 mg/dL — ABNORMAL HIGH (ref 8–23)
Calcium, Ion: 1.07 mmol/L — ABNORMAL LOW (ref 1.15–1.40)
Chloride: 97 mmol/L — ABNORMAL LOW (ref 98–111)
Creatinine, Ser: 5.1 mg/dL — ABNORMAL HIGH (ref 0.61–1.24)
Glucose, Bld: 91 mg/dL (ref 70–99)
HCT: 42 % (ref 39.0–52.0)
Hemoglobin: 14.3 g/dL (ref 13.0–17.0)
Potassium: 4.7 mmol/L (ref 3.5–5.1)
Sodium: 132 mmol/L — ABNORMAL LOW (ref 135–145)
TCO2: 26 mmol/L (ref 22–32)

## 2024-04-15 LAB — BASIC METABOLIC PANEL WITH GFR
Anion gap: 19 — ABNORMAL HIGH (ref 5–15)
BUN: 66 mg/dL — ABNORMAL HIGH (ref 8–23)
CO2: 25 mmol/L (ref 22–32)
Calcium: 9.4 mg/dL (ref 8.9–10.3)
Chloride: 92 mmol/L — ABNORMAL LOW (ref 98–111)
Creatinine, Ser: 5.42 mg/dL — ABNORMAL HIGH (ref 0.61–1.24)
GFR, Estimated: 11 mL/min — ABNORMAL LOW (ref >60)
Glucose, Bld: 110 mg/dL — ABNORMAL HIGH (ref 70–99)
Potassium: 4.7 mmol/L (ref 3.5–5.1)
Sodium: 136 mmol/L (ref 135–145)

## 2024-04-15 LAB — CBC
HCT: 38.4 % — ABNORMAL LOW (ref 39.0–52.0)
Hemoglobin: 13.1 g/dL (ref 13.0–17.0)
MCH: 26.4 pg (ref 26.0–34.0)
MCHC: 34.1 g/dL (ref 30.0–36.0)
MCV: 77.3 fL — ABNORMAL LOW (ref 80.0–100.0)
Platelets: 132 K/uL — ABNORMAL LOW (ref 150–400)
RBC: 4.97 MIL/uL (ref 4.22–5.81)
RDW: 17.2 % — ABNORMAL HIGH (ref 11.5–15.5)
WBC: 9.2 K/uL (ref 4.0–10.5)
nRBC: 0 % (ref 0.0–0.2)

## 2024-04-15 LAB — TROPONIN I (HIGH SENSITIVITY): Troponin I (High Sensitivity): 106 ng/L (ref <18)

## 2024-04-15 LAB — HEMOGLOBIN A1C
Hgb A1c MFr Bld: 6.7 % — ABNORMAL HIGH (ref 4.8–5.6)
Mean Plasma Glucose: 145.59 mg/dL

## 2024-04-15 LAB — GLUCOSE, CAPILLARY
Glucose-Capillary: 100 mg/dL — ABNORMAL HIGH (ref 70–99)
Glucose-Capillary: 114 mg/dL — ABNORMAL HIGH (ref 70–99)
Glucose-Capillary: 82 mg/dL (ref 70–99)

## 2024-04-15 LAB — AMMONIA: Ammonia: 32 umol/L (ref 9–35)

## 2024-04-15 LAB — MAGNESIUM: Magnesium: 1.8 mg/dL (ref 1.7–2.4)

## 2024-04-15 LAB — BRAIN NATRIURETIC PEPTIDE: B Natriuretic Peptide: >4500 pg/mL — ABNORMAL HIGH (ref 0.0–100.0)

## 2024-04-15 MED ORDER — ROCURONIUM BROMIDE 10 MG/ML (PF) SYRINGE
PREFILLED_SYRINGE | INTRAVENOUS | Status: AC | PRN
  Administered 2024-04-15: 50 mg via INTRAVENOUS

## 2024-04-15 MED ORDER — REVEFENACIN 175 MCG/3ML IN SOLN: 175.0000 ug | Freq: Every day | RESPIRATORY_TRACT | Status: DC

## 2024-04-15 MED ORDER — KETAMINE HCL 50 MG/5ML IJ SOSY
1.0000 mg/kg | PREFILLED_SYRINGE | Freq: Once | INTRAMUSCULAR | Status: DC
  Filled 2024-04-15: qty 10

## 2024-04-15 MED ORDER — NOREPINEPHRINE 4 MG/250ML-% IV SOLN
0.0000 ug/min | INTRAVENOUS | Status: DC
  Administered 2024-04-16: 12 ug/min via INTRAVENOUS
  Filled 2024-04-15 (×2): qty 250

## 2024-04-15 MED ORDER — NICOTINE 14 MG/24HR TD PT24
14.0000 mg | MEDICATED_PATCH | Freq: Every day | TRANSDERMAL | 0 refills | Status: DC
  Filled 2024-04-15: qty 30, 30d supply, fill #0

## 2024-04-15 MED ORDER — KETAMINE HCL 50 MG/5ML IJ SOSY
50.0000 mg | PREFILLED_SYRINGE | Freq: Once | INTRAMUSCULAR | Status: AC
  Administered 2024-04-15: 50 mg via INTRAVENOUS

## 2024-04-15 MED ORDER — PIPERACILLIN-TAZOBACTAM IN DEX 2-0.25 GM/50ML IV SOLN
2.2500 g | Freq: Three times a day (TID) | INTRAVENOUS | Status: DC
  Administered 2024-04-15 – 2024-04-16 (×2): 2.25 g via INTRAVENOUS
  Filled 2024-04-15 (×4): qty 50

## 2024-04-15 MED ORDER — DOCUSATE SODIUM 50 MG/5ML PO LIQD: 100.0000 mg | Freq: Two times a day (BID) | ORAL | Status: DC

## 2024-04-15 MED ORDER — LACTULOSE 10 GM/15ML PO SOLN
10.0000 g | Freq: Every day | ORAL | 0 refills | Status: DC | PRN
  Filled 2024-04-15: qty 120, 8d supply, fill #0

## 2024-04-15 MED ORDER — POLYETHYLENE GLYCOL 3350 17 G PO PACK: 17.0000 g | PACK | Freq: Every day | ORAL | Status: DC

## 2024-04-15 MED ORDER — BUDESONIDE 0.25 MG/2ML IN SUSP
0.2500 mg | Freq: Two times a day (BID) | RESPIRATORY_TRACT | Status: DC
  Administered 2024-04-15: 0.25 mg via RESPIRATORY_TRACT
  Filled 2024-04-15: qty 2

## 2024-04-15 MED ORDER — CHLORHEXIDINE GLUCONATE CLOTH 2 % EX PADS
6.0000 | MEDICATED_PAD | Freq: Every day | CUTANEOUS | Status: DC
  Administered 2024-04-15: 6 via TOPICAL

## 2024-04-15 MED ORDER — FENTANYL CITRATE PF 50 MCG/ML IJ SOSY
100.0000 ug | PREFILLED_SYRINGE | Freq: Once | INTRAMUSCULAR | Status: AC
  Administered 2024-04-15: 100 ug via INTRAVENOUS
  Filled 2024-04-15: qty 2

## 2024-04-15 MED ORDER — FENTANYL BOLUS VIA INFUSION
25.0000 ug | INTRAVENOUS | Status: DC | PRN
  Administered 2024-04-15: 100 ug via INTRAVENOUS
  Administered 2024-04-16: 75 ug via INTRAVENOUS

## 2024-04-15 MED ORDER — DICLOFENAC SODIUM 1 % EX GEL
4.0000 g | Freq: Four times a day (QID) | CUTANEOUS | 0 refills | Status: DC
  Filled 2024-04-15: qty 100, 8d supply, fill #0

## 2024-04-15 MED ORDER — ONDANSETRON HCL 4 MG PO TABS
4.0000 mg | ORAL_TABLET | Freq: Three times a day (TID) | ORAL | 0 refills | Status: DC | PRN
  Filled 2024-04-15: qty 20, 7d supply, fill #0

## 2024-04-15 MED ORDER — NOREPINEPHRINE 4 MG/250ML-% IV SOLN
0.0000 ug/min | INTRAVENOUS | Status: DC
  Administered 2024-04-15: 10 ug/min via INTRAVENOUS

## 2024-04-15 MED ORDER — FENTANYL 2500MCG IN NS 250ML (10MCG/ML) PREMIX INFUSION
0.0000 ug/h | INTRAVENOUS | Status: DC
  Administered 2024-04-15: 25 ug/h via INTRAVENOUS
  Filled 2024-04-15: qty 250

## 2024-04-15 MED ORDER — ONDANSETRON HCL 4 MG PO TABS
4.0000 mg | ORAL_TABLET | Freq: Once | ORAL | Status: DC
  Filled 2024-04-15: qty 1

## 2024-04-15 MED ORDER — SODIUM CHLORIDE 0.9 % IV SOLN: 250.0000 mL | INTRAVENOUS | Status: DC

## 2024-04-15 MED ORDER — ONDANSETRON HCL 4 MG/2ML IJ SOLN
4.0000 mg | Freq: Once | INTRAMUSCULAR | Status: AC
  Administered 2024-04-15: 4 mg via INTRAVENOUS

## 2024-04-15 MED ORDER — ORAL CARE MOUTH RINSE
15.0000 mL | OROMUCOSAL | Status: DC
  Administered 2024-04-16 (×3): 15 mL via OROMUCOSAL

## 2024-04-15 MED ORDER — PROPOFOL 1000 MG/100ML IV EMUL
0.0000 ug/kg/min | INTRAVENOUS | Status: DC
  Administered 2024-04-15: 5 ug/kg/min via INTRAVENOUS
  Filled 2024-04-15: qty 100

## 2024-04-15 MED ORDER — LIDOCAINE 5 % EX PTCH
1.0000 | MEDICATED_PATCH | CUTANEOUS | 0 refills | Status: DC
  Filled 2024-04-15: qty 30, 30d supply, fill #0

## 2024-04-15 MED ORDER — ARFORMOTEROL TARTRATE 15 MCG/2ML IN NEBU
15.0000 ug | INHALATION_SOLUTION | Freq: Two times a day (BID) | RESPIRATORY_TRACT | Status: DC
  Administered 2024-04-15: 15 ug via RESPIRATORY_TRACT
  Filled 2024-04-15: qty 2

## 2024-04-15 MED ORDER — INSULIN ASPART 100 UNIT/ML IJ SOLN: 0.0000 [IU] | INTRAMUSCULAR | Status: DC

## 2024-04-15 MED ORDER — ARIPIPRAZOLE 5 MG PO TABS
5.0000 mg | ORAL_TABLET | Freq: Every day | ORAL | 0 refills | Status: DC
  Filled 2024-04-15: qty 15, 15d supply, fill #0

## 2024-04-15 MED ORDER — ONDANSETRON 4 MG PO TBDP
ORAL_TABLET | ORAL | Status: AC
  Filled 2024-04-15: qty 1

## 2024-04-15 MED ORDER — ORAL CARE MOUTH RINSE
15.0000 mL | OROMUCOSAL | Status: DC | PRN
Start: 2024-04-15 — End: 2024-04-16

## 2024-04-15 MED ORDER — FENTANYL CITRATE PF 50 MCG/ML IJ SOSY
50.0000 ug | PREFILLED_SYRINGE | INTRAMUSCULAR | Status: DC | PRN
  Filled 2024-04-15: qty 1

## 2024-04-15 NOTE — ED Notes (Signed)
 MD notified about concerns of patient presentation and labored breathing. EDP asked to see patient at bedside.

## 2024-04-15 NOTE — ED Notes (Signed)
 This RN has asked MD about fentanyl /pain control at this time. Pt becoming intermittently restless. Awaiting new orders.

## 2024-04-15 NOTE — Progress Notes (Signed)
 Contacted by case management staff to be advised that pt will likely d/c to home with hospice today. Pt plans to continue with HD at d/c. Contacted [facility] to be made aware of pt's d/c today and that pt should resume care tomorrow. Clinic staff advised of hospice agency name and that they will allow pt to continue with HD at d/c. Update provided to renal NP and nephrologist.   Randine Mungo Dialysis Navigator [phone]

## 2024-04-15 NOTE — Plan of Care (Signed)
  Problem: Education: Goal: Knowledge of General Education information will improve Description: Including pain rating scale, medication(s)/side effects and non-pharmacologic comfort measures Outcome: Adequate for Discharge   Problem: Health Behavior/Discharge Planning: Goal: Ability to manage health-related needs will improve Outcome: Adequate for Discharge   Problem: Clinical Measurements: Goal: Ability to maintain clinical measurements within normal limits will improve Outcome: Adequate for Discharge Goal: Will remain free from infection Outcome: Adequate for Discharge Goal: Diagnostic test results will improve Outcome: Adequate for Discharge Goal: Respiratory complications will improve Outcome: Adequate for Discharge Goal: Cardiovascular complication will be avoided Outcome: Adequate for Discharge   Problem: Activity: Goal: Risk for activity intolerance will decrease Outcome: Adequate for Discharge   Problem: Nutrition: Goal: Adequate nutrition will be maintained Outcome: Adequate for Discharge   Problem: Coping: Goal: Level of anxiety will decrease Outcome: Adequate for Discharge   Problem: Elimination: Goal: Will not experience complications related to bowel motility Outcome: Adequate for Discharge Goal: Will not experience complications related to urinary retention Outcome: Adequate for Discharge   Problem: Pain Managment: Goal: General experience of comfort will improve and/or be controlled Outcome: Adequate for Discharge   Problem: Safety: Goal: Ability to remain free from injury will improve Outcome: Adequate for Discharge   Problem: Skin Integrity: Goal: Risk for impaired skin integrity will decrease Outcome: Adequate for Discharge   Problem: Safety: Goal: Non-violent Restraint(s) Outcome: Adequate for Discharge   Problem: Acute Rehab PT Goals(only PT should resolve) Goal: Pt Will Go Supine/Side To Sit Outcome: Adequate for Discharge Goal: Pt Will  Go Sit To Supine/Side Outcome: Adequate for Discharge Goal: Patient Will Transfer Sit To/From Stand Outcome: Adequate for Discharge Goal: Pt Will Transfer Bed To Chair/Chair To Bed Outcome: Adequate for Discharge Goal: Pt Will Ambulate Outcome: Adequate for Discharge Goal: Pt Will Go Up/Down Stairs Outcome: Adequate for Discharge   Problem: Acute Rehab OT Goals (only OT should resolve) Goal: Pt. Will Perform Grooming Outcome: Adequate for Discharge Goal: Pt. Will Perform Upper Body Bathing Outcome: Adequate for Discharge Goal: Pt. Will Perform Lower Body Bathing Outcome: Adequate for Discharge Goal: Pt. Will Perform Upper Body Dressing Outcome: Adequate for Discharge Goal: Pt. Will Perform Lower Body Dressing Outcome: Adequate for Discharge Goal: Pt. Will Transfer To Toilet Outcome: Adequate for Discharge Goal: Pt. Will Perform Toileting-Clothing Manipulation Outcome: Adequate for Discharge

## 2024-04-15 NOTE — ED Notes (Signed)
 Upon primary assessment of patient, patient showing labored breathing. Difficulty obtaining SpO2- reading at 84% on RA. Pt placed on NRB.

## 2024-04-15 NOTE — ED Notes (Signed)
 Message sent to critical care MD/attending with concerns about patient sedation/pain control due to patient becoming restless. Awaiting new orders.

## 2024-04-15 NOTE — Discharge Planning (Signed)
 [facility] Patient Discharge Orders- [facility]: [facility]. Patient is being discharged with home hospice BUT is continuing with hemodialysis.   Patient's name: Ronald Reeves Admit/DC Dates: 04/07/2024 - 04/15/2024  Discharge Diagnoses: Acute metabolic encephalopathy  Hyperkalemia - resolved with HD  Aranesp: Given: No    Last Hgb: 13.1 PRBC's Given: No  ESA dose for discharge: N/A IV Iron dose at discharge: N/A  Heparin  change: No  EDW Change: Yes New EDW: Lower to 52kg  Bath Change: No  Access intervention/Change: No   Calcitriol  change: No  Discharge Labs: Calcium  9.4  Phosphorus 4.0  Albumin  2.1  K+ 4.7  IV Antibiotics: No  On Coumadin ?: No. On Eliquis   OTHER/APPTS/LAB ORDERS:  Please note: Patient is being discharged with home hospice BUT is continuing with hemodialysis.      D/C Meds to be reconciled by nurse after every discharge.  Completed By: Charmaine Piety, NP   Reviewed by: MD:______ RN_______

## 2024-04-15 NOTE — ED Notes (Signed)
 Pt currently on bipap, awaiting RSI at this time. MD, pharmacy, RT, and secondary RN at bedside.

## 2024-04-15 NOTE — ED Provider Notes (Signed)
 [place] [facility]ERGENC[facility] DEPARTMENT AT [facility] Provider Note   CSN: [id number] Arrival date & time: 04/15/24  1810     History Chief Complaint  Patient presents with   Shortness of Breath    HPI: Ronald Reeves is a 68 y.o. male with history pertinent for essential hypertension, alcohol  use disorder, cocaine  use disorder, ESRD on IHD TTS, HFrEF, depression, A-fib, thrombocytopenia, ascites who presents complaining of respiratory distress. Patient arrivedfrom home accompanied by daughter history provided by patient and relative: Daughter.  No interpreter required during this encounter.  Patient reports that he is short of breath, and complains of severe nausea.  Daughter at bedside provides majority of history.  Reports that the patient was recently discharged from the hospital earlier today.  Reports that their mother is also hospitalized and is currently in the ICU.  Reports that the patient was reportedly discharged home on hospice, however she and her sisters had been unaware of that, and were unaware that he was getting discharged.  Reports that when he arrived home he had hemoptysis and respiratory distress.  Reports that they have not yet established care with hospice.  She called the hospice that they have been assigned to speak to told them that they could come to the emergency department if the patient wanted to come to the emergency department.  Patient's recorded medical, surgical, social, medication list and allergies were reviewed in the Snapshot window as part of the initial history.   Prior to Admission medications   Medication Sig Start Date End Date Taking? Authorizing Provider  acetaminophen  (TYLENOL ) 500 MG tablet Take 500-1,000 mg by mouth 2 (two) times daily as needed for moderate pain (pain score 4-6), fever or headache.    [provider]  albuterol  (VENTOLIN  HFA) 108 (90 Base) MCG/ACT inhaler Inhale 2 puffs into the lungs every 6 (six) hours as needed  (COPD). Patient not taking: Reported on 04/11/2024    [provider]  ARIPiprazole  (ABILIFY ) 5 MG tablet Take 1 tablet (5 mg total) by mouth daily. 04/15/24   King, Olivia, DO  diclofenac  Sodium (VOLTAREN ) 1 % GEL Apply 4 g topically 4 (four) times daily for 8 days. 04/15/24 04/23/24  Myrna Bitters, DO  lactulose  (CHRONULAC ) 10 GM/15ML solution Take 15 mLs (10 g total) by mouth daily as needed for mild constipation (If he becomes increasingly confused). 04/15/24 05/15/24  King, Olivia, DO  lidocaine  (LIDODERM ) 5 % Place 1 patch onto the skin daily. Remove & Discard patch within 12 hours or as directed by MD 04/15/24   Myrna Bitters, DO  nicotine  (NICODERM CQ  - DOSED IN MG/24 HOURS) 14 mg/24hr patch Place 1 patch (14 mg total) onto the skin daily at 6 (six) AM. 2024-05-09   Myrna Bitters, DO  ondansetron  (ZOFRAN ) 4 MG tablet Take 1 tablet (4 mg total) by mouth every 8 (eight) hours as needed for up to 30 doses for nausea or vomiting. 04/15/24   King, Olivia, DO  oxyCODONE -acetaminophen  (PERCOCET/ROXICET) 5-325 MG tablet Take 1 tablet by mouth every 4 (four) hours as needed for severe pain (pain score 7-10). Patient not taking: Reported on 04/11/2024 04/06/24   Patsey Lot, MD     Allergies: Neurontin  [gabapentin ], Vasotec  [enalapril ], and Iodinated contrast media   Review of Systems   ROS as per HPI  Physical Exam Updated Vital Signs BP 111/78   Pulse 87   Temp 98 F (36.7 C) (Oral)   Resp (!) 31   SpO2 (!) 84%  Physical  Exam Vitals and nursing note reviewed.  Constitutional:      General: He is in acute distress.     Appearance: He is ill-appearing and toxic-appearing.  HENT:     Head: Normocephalic and atraumatic.     Mouth/Throat:     Mouth: Mucous membranes are moist.     Comments: Coughing up blood Eyes:     Extraocular Movements: Extraocular movements intact.  Cardiovascular:     Rate and Rhythm: Normal rate and regular rhythm.  Pulmonary:     Effort: Tachypnea, accessory muscle  usage and respiratory distress present.     Breath sounds: Decreased breath sounds present.  Abdominal:     General: Bowel sounds are normal.     Palpations: Abdomen is soft.  Musculoskeletal:     Right lower leg: No tenderness. No edema.     Left lower leg: No tenderness. No edema.  Neurological:     Mental Status: He is alert and oriented to person, place, and time.     ED Course/ Medical Decision Making/ A&P    Procedures ARTERIAL LINE  Date/Time: 04/15/2024 7:10 PM  Performed by: Rogelia Jerilynn RAMAN, MD Authorized by: Rogelia Jerilynn RAMAN, MD   Consent:    Consent obtained:  Verbal and emergent situation   Consent given by:  Patient (Daughters at bedside)   Risks, benefits, and alternatives were discussed: yes     Risks discussed:  Bleeding, ischemia, pain and repeat procedure Universal protocol:    Site/side marked: yes     Immediately prior to procedure, a time out was called: yes     Patient identity confirmed:  Verbally with patient Indications:    Indications: hemodynamic monitoring and multiple ABGs   Pre-procedure details:    Skin preparation:  Chlorhexidine    Preparation: Patient was prepped and draped in sterile fashion   Sedation:    Sedation type:  None Anesthesia:    Anesthesia method:  Local infiltration   Local anesthetic:  Lidocaine  1% w/o epi Procedure details:    Location:  R radial   Allen's test performed: yes     Allen's test abnormal: no     Needle gauge:  22 G   Placement technique:  Seldinger and ultrasound guided   Number of attempts:  3   Transducer: waveform confirmed   Post-procedure details:    Post-procedure:  Secured with tape, sterile dressing applied and sutured   CMS:  Normal   Procedure completion:  Tolerated well, no immediate complications .Critical Care  Performed by: Rogelia Jerilynn RAMAN, MD Authorized by: Rogelia Jerilynn RAMAN, MD   Critical care provider statement:    Critical care time (minutes):  93   Critical care time  was exclusive of:  Teaching time and separately billable procedures and treating other patients   Critical care was necessary to treat or prevent imminent or life-threatening deterioration of the following conditions:  Respiratory failure, shock, circulatory failure and CNS failure or compromise   Critical care was time spent personally by me on the following activities:  Development of treatment plan with patient or surrogate, discussions with consultants, evaluation of patient's response to treatment, examination of patient, ordering and review of laboratory studies, ordering and review of radiographic studies, ordering and performing treatments and interventions, pulse oximetry, re-evaluation of patient's condition, review of old charts and obtaining history from patient or surrogate   I assumed direction of critical care for this patient from another provider in my specialty: no  Care discussed with: admitting provider   Procedure Name: Intubation Date/Time: 04/15/2024 7:33 PM  Performed by: Rogelia Jerilynn RAMAN, MDPre-anesthesia Checklist: Patient identified, Patient being monitored, Emergency Drugs available, Timeout performed and Suction available Preoxygenation: Pre-oxygenation with 100% oxygen (Preoxygenated with 100% oxygen via BiPAP) Induction Type: Rapid sequence Laryngoscope Size: Glidescope and 3 Grade View: Grade I Tube size: 7.5 mm Airway Equipment and Method: Video-laryngoscopy and Stylet Placement Confirmation: ETT inserted through vocal cords under direct vision, CO2 detector and Breath sounds checked- equal and bilateral Secured at: 25 cm Tube secured with: ETT holder Dental Injury: Teeth and Oropharynx as per pre-operative assessment        Medications Ordered in ED Medications  ondansetron  (ZOFRAN -ODT) 4 MG disintegrating tablet (has no administration in time range)  propofol  (DIPRIVAN ) 1000 MG/100ML infusion (5 mcg/kg/min  52.4 kg Intravenous New Bag/Given 04/15/24  2027)  Chlorhexidine  Gluconate Cloth 2 % PADS 6 each (6 each Topical Given 04/15/24 2329)  insulin  aspart (novoLOG ) injection 0-9 Units ( Subcutaneous Not Given 04/15/24 2330)  fentaNYL  (SUBLIMAZE ) injection 50 mcg (has no administration in time range)  docusate (COLACE) 50 MG/5ML liquid 100 mg (100 mg Per Tube Not Given 04/15/24 2331)  polyethylene glycol (MIRALAX  / GLYCOLAX ) packet 17 g (17 g Per Tube Not Given 04/15/24 2330)  fentaNYL  in NS (43mcg/ml) infusion-PREMIX (25 mcg/hr Intravenous New Bag/Given 04/15/24 2205)  fentaNYL  (SUBLIMAZE ) bolus via infusion 25-100 mcg (100 mcg Intravenous Bolus from Bag 04/15/24 2224)  revefenacin  (YUPELRI ) nebulizer solution 175 mcg (has no administration in time range)  budesonide  (PULMICORT ) nebulizer solution 0.25 mg (0.25 mg Nebulization Given 04/15/24 2315)  arformoterol  (BROVANA ) nebulizer solution 15 mcg (15 mcg Nebulization Given 04/15/24 2315)  piperacillin -tazobactam (ZOSYN ) IVPB 2.25 g (2.25 g Intravenous New Bag/Given 04/15/24 2328)  Oral care mouth rinse (has no administration in time range)  Oral care mouth rinse (has no administration in time range)  0.9 %  sodium chloride  infusion (has no administration in time range)  norepinephrine  (LEVOPHED ) 4mg  in (0.016 mg/mL) premix infusion (12 mcg/min Intravenous New Bag/Given 2024-05-03 0101)  ondansetron  (ZOFRAN ) injection 4 mg (4 mg Intravenous Given 04/15/24 1846)  ketamine  50 mg in normal saline 5 mL (10 mg/mL) syringe (50 mg Intravenous Given 04/15/24 1941)  fentaNYL  (SUBLIMAZE ) injection 100 mcg (100 mcg Intravenous Given 04/15/24 1950)  rocuronium  (ZEMURON ) injection (50 mg Intravenous Given 04/15/24 1944)    Medical Decision Making:   Yuvan Medinger is a 68 y.o. male who presents for respiratory distress as per above.  Physical exam is pertinent for shortness of breath.   The differential includes but is not limited to respiratory distress with ongoing hemoptysis, tachypnea, hypoxia, increased work  of breathing.  Independent historian: Relative: Daughters at bedside  External data reviewed: Notes: Reviewed patient's recent discharge summary from earlier today that he was discharged to hospice with multisystem organ failure after discussions with son  Labs: Ordered, Independent interpretation, and Details: CBG WNL.  CBC without leukocytosis, thrombocytopenia.  Stable anemia.  BMP with elevation of anion gap, creatinine, BUN similar in comparison to prior.  ABG with pH 7.4, hyperoxia on BiPAP, pCO2 WNL.  Ammonia WNL.  Magnesium  WNL, troponin elevated at 106 consistent with demand ischemia.  BNP undetectably elevated similar in comparison to prior  Radiology: Ordered, Independent interpretation, Details: Chest x-ray with appropriate placement of ET tube, no focal airspace opacification, cardiomediastinal silhouette derangement pneumothorax, pleural effusion, bony derangement, and All images reviewed independently.  Agree with radiology report at this time.  DG Chest Portable 1 View Result Date: 04/15/2024 EXAM: 1 VIEW(S) XRAY OF THE CHEST 04/15/2024 09:46:00 PM COMPARISON: Comparison to 04/07/2024. CLINICAL HISTORY: Shortness of breath. FINDINGS: LUNGS AND PLEURA: Patchy airspace opacities in the right mid lung and left lower lung are increased compared to 04/08/2023. No definite pleural effusion. No pneumothorax. HEART AND MEDIASTINUM: Stable cardiomegaly. LINES AND TUBES: Right IJ cvc tip in the right atrium. Endotracheal Tube tip in the intrathoracic trachea 3.0 cm from the carina. Subdiaphragmatic enteric tube. BONES AND SOFT TISSUES: Aortic atherosclerotic calcification. IMPRESSION: 1. Increased patchy airspace opacities in the right mid lung and left lower lung compared to 04/07/2024. 2. Stable cardiomegaly. 3. Lines and tubes as above. Electronically signed by: Norman Gatlin MD 04/15/2024 09:52 PM EDT RP Workstation: [id number]   EKG/Medicine tests: Ordered and Independent interpretation EKG  Interpretation: Sinus rhythm Atrial premature complex LAE, consider biatrial enlargement LVH with secondary repolarization abnormality Anterior infarct, old Wandering baseline Confirmed by Rogelia Satterfield ([id number]) on 05/15/2024 1:42:39 AM               Interventions: Zofran , fentanyl , ketamine , rocuronium , propofol  gtt., Levophed   See the EMR for full details regarding lab and imaging results.  On arrival of patient, patient hypoxic, tachypneic, having active hemoptysis.  Nursing alerted me and I presented immediately to bedside to evaluate the patient, who was in respiratory distress.  Daughters presented to bedside and provided additional history regarding patient's recent discharge.  States that they were unaware that he had made decision to convert to hospice care as well as to change his CODE STATUS to DNR.  Given respiratory distress, patient was initiated on nonrebreather, oxygen saturation not reliably reading.  IV access obtained by nursing, however unable to draw labs due to poor pullback on IVs.  Given unable to obtain reliable oxygen reading on pulse ox despite numerous anatomic locations attempting, ABG drawn by respiratory which demonstrates that patient is saturating appropriately on BiPAP.  Unfortunately, patient tolerating BiPAP poorly.  Discussed that in absence of BiPAP, patient would continue having significant respiratory distress, therefore options were de-escalation of care or intubation.  Patient stated that he would like to be intubated.  With regard to CODE STATUS, reconfirmed patient's CODE STATUS and patient initially states that he would like us  to perform CPR in the event of a cardiac arrest, however hesitates on answer.  Allowed daughters and patient time to discuss independently, and then removed family from the room to discuss independently with patient, and patient stated that he would like to be full code at this time.  Discussed with patient and daughters that intubation was  high risk, and there was a significant risk of cardiac arrest during intubation, and significant risk of inability to resuscitate should patient have cardiac arrest given his complex medical history.  Patient and family expressed understanding, and patient family was able to call him and wish him goodbye.  Given unreliable blood pressure readings and pulse oximetry, arterial line was placed in the right radial artery as per procedure note above, and did reveal that patient was hypotensive with MAP in the 50s.  Patient was initiated on Levophed .  Given patient's persistent tachypnea and increased work of breathing and poor tolerance of the BiPAP, proceeded with intubation as per procedure note above.  Given labs were unable to be drawn initially and his history of ESRD, rocuronium  used as well as ketamine  for DSI such that BiPAP could be continued to maintain positive pressure up until movement of intubation.  Patient intubated without acute complication.  Thereafter blood pressure stabilized on 5 mcg of Levophed , therefore initiated on propofol  for sedation.  Additional labs and imaging ordered, suspect that pulmonary embolism may be contributing to patient's presentation given his hemoptysis and acute respiratory distress.  Critical care consulted, patient was accepted to ICU for further management.  Additionally while in the ED, came to light that patient had a prior AV fistula in his right upper extremity, though he currently dialyzes through a PermCath.  Given arterial line had been placed in his right upper extremity under emergent circumstances, as well as bilateral upper extremities were being utilized for infusions, nephrology consulted, and apprised that these extremities were being utilized.  Nephrology expressed understanding, and state that they will follow patient while in hospital for dialysis needs.  Presentation is most consistent with acute life/limb-threatening illness and Current  presentation is complicated by underlying chronic conditions  Discussion of management or test interpretations with external provider(s): Critical care, nephrology  Risk Drugs:OTC drugs, Prescription drug management, Parenteral controlled substances, and Drug therapy requiring intensive monitoring for toxicity Treatment: Decision regarding hospitalization Critical Care: 92  Disposition: CRITICAL: The patient is critically-ill and requires intensive care and was admitted to ICU. Please see inpatient provider note for additional treatment plan details.   MDM generated using voice dictation software and may contain dictation errors.  Please contact me for any clarification or with any questions.  Clinical Impression:  1. Respiratory distress   2. Hemoptysis   3. Acute hypoxic respiratory failure (HCC)   4. Demand ischemia [facility])      Admit   Final Clinical Impression(s) / ED Diagnoses Final diagnoses:  Respiratory distress  Hemoptysis  Acute hypoxic respiratory failure (HCC)  Demand ischemia [facility])    Rx / DC Orders ED Discharge Orders     None        Rogelia Jerilynn RAMAN, MD May 05, 2024 [phone]

## 2024-04-15 NOTE — Progress Notes (Signed)
 Pt transported from ED room 1 to 3M03 on the ventilator without any complications.

## 2024-04-15 NOTE — Progress Notes (Signed)
 Informed by ER MD for routine ESRD consult. Patient just discharged earlier today to home hospice. Presents with AHRF/VDRF, now intubated, requiring pressors. DNR rescinded by patient prior to intubation. GOC to be re-addressed by primary service. No acute indications for CRRT at this time. Will see in AM. Please call with any questions/concerns/need for CRRT in the interim. Discussed with PCCM.  Ephriam Stank, MD [facility]

## 2024-04-15 NOTE — ED Notes (Signed)
 IV REMOVED from right side that was previously ultrasound placed. EDP made aware of situation. A-line remains intact.

## 2024-04-15 NOTE — ED Notes (Signed)
 Rapid Response for RSI begin at 1938.  Ketamine  @1941  Roc @1944   Intubation @1946 

## 2024-04-15 NOTE — H&P (Signed)
NAME:  Ronald Reeves, MRN:  [id number], DOB:  [birth year], LOS: 0 ADMISSION DATE:  04/15/2024, CONSULTATION DATE:  04/15/24  REFERRING MD: Rogelia CHIEF COMPLAINT:  Shortness of breath   History of Present Illness:  Patient is a 68 year old male with a significant past medical history of end-stage renal disease on IHD (TTS), A-fib, HFrEF, cirrhosis, COPD, alcohol use disorder, cocaine  use disorder, thrombocytopenia, and GERD who presents to [facility] ED on 04/15/2024 with complaints of respiratory distress.  Patient was recently hospitalized on 8/28 to 04/15/2024 due to acute metabolic encephalopathy, who was discharged earlier today on hospice but still pursue hemodialysis.  Patient arrived home by EMS, and began developing shortness of breath along with hemoptysis.  Per daughter at bedside, hemoptysis was minimal at first, sputum began blood-tinged and then patient began producing a couple of ounces of blood per cough.  Patient was transported back to the hospital for respiratory distress where he was initially placed on nonrebreather and transition to BiPAP.  Patient originally a DNR on arrival but rescinded his DNR per ED physician and ultimately agreed with intubation for respiratory distress and not being able to tolerate BiPAP.  PCCM consulted for admission and further management of patient.  Labs taking on morning of 04/15/2024 at 1149-sodium 136, potassium 4.7, chloride 92, CO2 is 25, glucose 110, BUN 66, fattening 5.42, calcium  9.4, anion gap 19  Last CBC on previous admission 9/3-WBC 8, hemoglobin 12.2, hematocrit 36.2, platelets 161  Current ED labs per i-STAT, 2044 sodium 132, potassium 4.7, chloride 97, glucose 91, BUN 66, creatinine 5.10, ionized calcium  1.07, hemoglobin 14.3, hematocrit 42  Chest x-ray, CT angio of chest to rule out PE, CT of head, pending as well as other labs  Upon initial assessment patient, patient severely cachectic and deconditioned.  Daughter and niece at bedside,  emotional.  Patient is on Levophed  infusion at 10 mcg/h, A-line placed.  Patient has right HD catheter, clean at the site. Discussed with daughter and niece at bedside, that unfortunately patient is acute on chronically ill, and suspect patient is unfortunately in the dying process.  Recommended to daughter and niece, that were discussed and wants the wife and family about transitioning patient back to DNR/DNI especially if patient worsens and proceeds into a cardiac arrest.  Currently, wife is also a patient here at [facility] within the ICU.  Daughter is concerned of mother's capacity to make decisions in regards to patient's goals of care and CODE STATUS.  Pertinent  Medical History   Past Medical History:  Diagnosis Date   Anemia    Anxiety    Arthritis    Back pain    Bronchitis    COPD (chronic obstructive pulmonary disease) (HCC)    Coronary artery disease    COVID    mild - flu like symptoms   Dyspnea    w/ exertion, uses inhaler   ESRD on hemodialysis (HCC)    dialysis on tues, thurs, sat at [facility]   GERD (gastroesophageal reflux disease)    not a current problem   Heart murmur    never has caused any problems   Hypertension    Myocardial infarction (HCC)    Pneumonia    x 1   Pre-diabetes    diet controlled, no meds, does not check blood sugar   Substance abuse (HCC)      Significant Hospital Events: Including procedures, antibiotic start and stop dates in addition to other pertinent events   9/5 discharge  1532, home with hospice but plan to still obtain hemodialysis 9/5 patient returns to ED in respiratory distress/hemoptysis, ultimately intubated, PCCM consulted for admission  Interim History / Subjective:  Ill-appearing cachectic adult male Intubated, responds to painful stimuli, enlarged pupils bilaterally-3+  Levophed  infusion Right HD cath PTA-clean at site  Right radial A-line  Objective    Blood pressure 111/78, pulse 87, temperature 98 F (36.7  C), temperature source Oral, resp. rate 18, SpO2 (!) 84%.    Vent Mode: PRVC FiO2 (%):  [100 %] 100 % Set Rate:  [15 bmp-18 bmp] 18 bmp Vt Set:  [570 mL] 570 mL PEEP:  [5 cmH20] 5 cmH20 Plateau Pressure:  [28 cmH20] 28 cmH20  No intake or output data in the 24 hours ending 04/15/24 2043 There were no vitals filed for this visit.  Examination: General: Ill-appearing, cachectic adult male lying in ED stretcher on ventilator HENT: Normocephalic, pupils dolhhpdy/ipojuzi-6+ bilateral, poor dentition, pink MM Lungs: Diminished, NAD on ventilator, O2 sats 98% but FiO2 100% Cardiovascular: S1/S2, RRR, no MRG, no JVD Abdomen: Bowel sounds, hypoactive, ascites Extremities: Cachectic, frail, responds to pain in all extremities Neuro: RASS -3, painful stimuli, cough gag flex sluggish GU: Foley intact-urine 10/white color  Resolved problem list   Assessment and Plan  Acute respiratory failure/respiratory distress- Hemoptysis-appears to be short-lived, upon suctioning patient no hemoptysis noted Unclear etiology respiratory distress-possibly CHF exacerbation, COPD exacerbation, PE-however less likely because patient was taking Eliquis  5 mg daily but will for A-fib history but discharge summary-order patient to stop taking because of hospice care COPD hx P:  Continue ventilator support and lung protective strategies  Continue LTVV  Wean PEEP and Fio2 requirements to sat goal of >92%  HOB > 30 degrees Plat < 30  Aim for Driving pressures < 15  Intermittent Chest X-ray and ABGS Obtain and follow cultures-blood and tracheal aspirate VAP and PAD protocols in place  Initiated on propofol  within the emergency department continue for now, attempt to perform wake up assessment in a.m. Chest x-ray pending will need to follow-up with CTA pending to rule out PE will need to follow-up with Placed on triple therapy bronchodilators Check CBC, RSV/COVID/flu, lactic acid, BNP, obtain blood  cultures Admit to ICU for further hemodynamic monitoring Continue Levophed  greater than 65 MAP goal  Hypotension - Suspect this could be related to sedation but cannot rule out shock at this time-sepsis versus cardiogenic P: Continue Levophed  infusion Continue to monitor blood pressure per A-line  HFrEF A-fib history-on Eliquis  prior to 9/5 Currently- sinus tach Last echo obtained on 09/04/2023 EF was noted to be at that time 20 to 25% with decreased function, global hypokinesis, grade 3 diastolic dysfunction RV function also moderately reduced, elevated pulmonary arterial systolic pressure P: Place on cardiac telemetry Monitor electrolytes Obtain routine echo in a.m. 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  Last HD run was for about 2 hours on 9/3 P: Continue to trend renal function daily  Continue to monitor and optimize electrolytes daily Continue to monitor urine output Continue strict  I/Os Continue Adequate renal perfusion  Avoid nephrotoxic agents  If patient remains full code and family decides aggressive measures, will need nephrology consult in a.m. for HD  GOC Recent discharge on hospice care-9/5 Discharge summary noted multisystem organ failure secondary to HFrEF, cirrhosis, end-stage renal disease on HD.  Per discharge note-patient desired to go home on 9/2 with home hospice but continuing hemodialysis Palliative care was also involved with goals of care  discussions on previous admission with patient and also/son in regards to his poor prognosis and end-of-life care Per discharge note patient was stable upon discharge and this was discussed with son, where son in agreement with honoring father's wishes of going home with hospice and taking care of him along with his sister P: Will need palliative care consult-STAT in AM to revisit goals of care/CODE STATUS along with wife and family since patient cannot make decisions on his own at this time  Labs   CBC: Recent Labs  Lab 04/10/24 0957 04/12/24 0830 04/13/24 1841 04/14/24 1611 04/15/24 0822 04/15/24 1902  WBC 5.3 6.4 10.8* 8.0 9.2  --   NEUTROABS 4.4  --   --   --   --   --   HGB 13.1 12.3* 13.4 12.2* 13.1 13.6  HCT 38.8* 36.5* 39.6 36.2* 38.4* 40.0  MCV 78.1* 78.7* 78.9* 77.8* 77.3*  --   PLT 122* 115* 104* 161 132*  --     Basic Metabolic Panel: Recent Labs  Lab 04/09/24 0345 04/10/24 0957 04/12/24 1949 04/14/24 0459 04/14/24 1611 04/15/24 1149 04/15/24 1902  NA 132* 134* 133*  --  136 136 131*  K 5.5* 4.4 3.9  --  4.0 4.7 4.6  CL 93* 92* 94*  --  91* 92*  --   CO2 23 27 27   --  28 25  --   GLUCOSE 98 134* 107*  --  148* 110*  --   BUN 62* 46* 44*  --  70* 66*  --   CREATININE 5.68* 4.33* 3.88*  --  5.59* 5.42*  --   CALCIUM  9.5 9.3 9.0  --  8.9 9.4  --   MG  --   --   --  2.1  --   --   --   PHOS 7.4*  --   --   --  4.0  --   --    GFR: Estimated Creatinine Clearance: 9.9 mL/min (A) (by C-G formula based on SCr of 5.42 mg/dL (H)). Recent Labs  Lab 04/12/24 0830 04/13/24 1841 04/14/24 1611 04/15/24 0822  WBC 6.4 10.8* 8.0 9.2    Liver Function Tests: Recent Labs  Lab 04/09/24 0345 04/12/24 1949 04/14/24 1611  AST  --  21  --   ALT  --  18  --   ALKPHOS  --  110  --   BILITOT  --  0.7  --   PROT  --  6.0*  --   ALBUMIN  2.4* 2.3* 2.1*   No results for input(s): LIPASE, AMYLASE in the last 168 hours. No results for input(s): AMMONIA in the last  168 hours.  ABG    Component Value Date/Time   PHART 7.440 04/15/2024 1902   PCO2ART 41.7 04/15/2024 1902   PO2ART 248 (H) 04/15/2024 1902   HCO3 28.4 (H) 04/15/2024 1902   TCO2 30 04/15/2024 1902   ACIDBASEDEF 2.0 03/17/2024 0456   O2SAT 100 04/15/2024 1902     Coagulation Profile: No results for input(s): INR, PROTIME in  the last 168 hours.  Cardiac Enzymes: No results for input(s): CKTOTAL, CKMB, CKMBINDEX, TROPONINI in the last 168 hours.  HbA1C: Hgb A1c MFr Bld  Date/Time Value Ref Range Status  03/14/2023 05:38 AM 5.8 (H) 4.8 - 5.6 % Final    Comment:    (NOTE) Pre diabetes:          5.7%-6.4%  Diabetes:              >6.4%  Glycemic control for   <7.0% adults with diabetes     CBG: Recent Labs  Lab 04/13/24 0733 04/14/24 0802 04/14/24 1152 04/15/24 0806 04/15/24 1206  GLUCAP 123* 149* 140* 100* 114*    Review of Systems:   See HPI   Past Medical History:  He,  has a past medical history of Anemia, Anxiety, Arthritis, Back pain, Bronchitis, COPD (chronic obstructive pulmonary disease) (HCC), Coronary artery disease, COVID, Dyspnea, ESRD on hemodialysis (HCC), GERD (gastroesophageal reflux disease), Heart murmur, Hypertension, Myocardial infarction (HCC), Pneumonia, Pre-diabetes, and Substance abuse (HCC).   Surgical History:   Past Surgical History:  Procedure Laterality Date   A/V FISTULAGRAM Right 06/17/2023   Procedure: A/V Fistulagram;  Surgeon: Lanis Fonda BRAVO, MD;  Location[facility] INVASIVE CV LAB;  Service: Cardiovascular;  Laterality: Right;   A/V FISTULAGRAM Right 07/17/2023   Procedure: A/V Fistulagram;  Surgeon: Magda Debby SAILOR, MD;  Location: [facility] INVASIVE CV LAB;  Service: Cardiovascular;  Laterality: Right;   A/V SHUNT INTERVENTION N/A 12/10/2023   Procedure: A/V SHUNT INTERVENTION;  Surgeon: Tobie Gordy POUR, MD;  Location: [facility] INVASIVE CV LAB;  Service: Cardiovascular;  Laterality: N/A;   AV FISTULA PLACEMENT Left 04/12/2021    Procedure: LEFT ARM ARTERIOVENOUS (AV) FISTULA CREATION;  Surgeon: Gretta [name] PARAS, MD;  Location: [facility] OR;  Service: Vascular;  Laterality: Left;   AV FISTULA PLACEMENT Left 10/18/2021   Procedure: LEFT ARM BRACHIOBASILIC FISTULA CREATION FIRST STAGE;  Surgeon: Gretta [name] PARAS, MD;  Location: [facility] OR;  Service: Vascular;  Laterality: Left;   AV FISTULA PLACEMENT Right 11/28/2022   Procedure: RIGHT ARM FIRST STAGE BRACHIOBASILIC FISTULA CREATION;  Surgeon: Gretta [name] PARAS, MD;  Location: [facility] OR;  Service: Vascular;  Laterality: Right;   AV FISTULA PLACEMENT Right 10/19/2023   Procedure: RIGHT ARM ARTERIOVENOUS GRAFT CREATION;  Surgeon: Magda Debby SAILOR, MD;  Location: [facility] OR;  Service: Vascular;  Laterality: Right;   BACK SURGERY     BASCILIC VEIN TRANSPOSITION Left 01/27/2022   Procedure: LEFT SECOND STAGE BASILIC VEIN TRANSPOSITION;  Surgeon: Gretta [name] PARAS, MD;  Location: [facility] OR;  Service: Vascular;  Laterality: Left;   BASCILIC VEIN TRANSPOSITION Right 02/18/2023   Procedure: SECOND STAGE RIGHT ARM BASILIC VEIN TRANSPOSITION;  Surgeon: Gretta [name] PARAS, MD;  Location: [facility] OR;  Service: Vascular;  Laterality: Right;   BIOPSY  08/20/2022   Procedure: BIOPSY;  Surgeon: San Sandor GAILS, DO;  Location: [facility] ENDOSCOPY;  Service: Gastroenterology;;   COLONOSCOPY WITH PROPOFOL  N/A 08/20/2022   Procedure: COLONOSCOPY WITH PROPOFOL ;  Surgeon: San Sandor GAILS, DO;  Location: [facility];  Service: Gastroenterology;  Laterality: N/A;   DIALYSIS/PERMA CATHETER INSERTION Right 03/05/2023   Procedure: DIALYSIS/PERMA CATHETER INSERTION;  Surgeon: Gretta [name] PARAS, MD;  Location: [facility] INVASIVE CV LAB;  Service: Cardiovascular;  Laterality: Right;   DIALYSIS/PERMA CATHETER INSERTION  12/10/2023   Procedure: DIALYSIS/PERMA CATHETER INSERTION;  Surgeon: Tobie Gordy POUR, MD;  Location: [facility] INVASIVE CV LAB;  Service: Cardiovascular;;   DIALYSIS/PERMA CATHETER INSERTION N/A 01/21/2024   Procedure:  DIALYSIS/PERMA CATHETER INSERTION;  Surgeon: Serene Gaile ORN, MD;  Location: [facility];  Service: Cardiovascular;  Laterality: N/A;   ESOPHAGOGASTRODUODENOSCOPY (EGD) WITH PROPOFOL  N/A 08/20/2022   Procedure: ESOPHAGOGASTRODUODENOSCOPY (EGD) WITH PROPOFOL ;  Surgeon: San Sandor GAILS, DO;  Location: [facility];  Service: Gastroenterology;  Laterality: N/A;   INSERTION OF DIALYSIS CATHETER Right 05/01/2022   Procedure: INSERTION OF DIALYSIS CATHETER;  Surgeon: Magda Debby SAILOR, MD;  Location: [facility];  Service: Vascular;  Laterality: Right;   INSERTION OF DIALYSIS CATHETER Right 07/10/2023   Procedure: ULTRASOUNDED GUIDED INSERTION OF TUNNELED  DIALYSIS CATHETER TO RIGHT FEMORAL VEIN;  Surgeon: Gretta [name] PARAS, MD;  Location: [facility] OR;  Service: Vascular;  Laterality: Right;   IR FLUORO GUIDE CV LINE RIGHT  12/07/2023   IR FLUORO GUIDE CV LINE RIGHT  12/09/2023   IR REMOVAL TUN CV CATH W/O FL  12/19/2022   IR REMOVAL TUN CV CATH W/O FL  11/18/2023   IR US  GUIDE VASC ACCESS RIGHT  12/07/2023   LIGATION OF COMPETING BRANCHES OF ARTERIOVENOUS FISTULA Left 07/15/2021   Procedure: LIGATION OF COMPETING BRANCHES OF LEFT RADIOCEPHALIC ARTERIOVENOUS FISTULA TIMES TWO;  Surgeon: Gretta [name] PARAS, MD;  Location: [facility] OR;  Service: Vascular;  Laterality: Left;   PERIPHERAL VASCULAR BALLOON ANGIOPLASTY  07/17/2023   Procedure: PERIPHERAL VASCULAR BALLOON ANGIOPLASTY;  Surgeon: Magda Debby SAILOR, MD;  Location: [facility] INVASIVE CV LAB;  Service: Cardiovascular;;   PERIPHERAL VASCULAR INTERVENTION  06/17/2023   Procedure: PERIPHERAL VASCULAR INTERVENTION;  Surgeon: Lanis Fonda BRAVO, MD;  Location: [facility] INVASIVE CV LAB;  Service: Cardiovascular;;   POLYPECTOMY  08/20/2022   Procedure: POLYPECTOMY;  Surgeon: San Sandor GAILS, DO;  Location: [facility];  Service: Gastroenterology;;   REVISON OF ARTERIOVENOUS FISTULA Left 07/15/2021   Procedure: REVISON OF LEFT ARTERIOVENOUS FISTULA;  Surgeon: Gretta [name] PARAS, MD;   Location: [facility] OR;  Service: Vascular;  Laterality: Left;   REVISON OF ARTERIOVENOUS FISTULA Left 05/01/2022   Procedure: ARTERIOVENOUS FISTULA WASHOUT OF ARM HEMATOMA;  Surgeon: Magda Debby SAILOR, MD;  Location: [facility] OR;  Service: Vascular;  Laterality: Left;   TUNNELLED CATHETER EXCHANGE N/A 10/23/2023   Procedure: TUNNELLED CATHETER EXCHANGE;  Surgeon: Norine Manuelita LABOR, MD;  Location: [facility] INVASIVE CV LAB;  Service: Cardiovascular;  Laterality: N/A;   UPPER EXTREMITY VENOGRAPHY Bilateral 08/28/2023   Procedure: UPPER EXTREMITY VENOGRAPHY;  Surgeon: Magda Debby SAILOR, MD;  Location: [facility] INVASIVE CV LAB;  Service: Cardiovascular;  Laterality: Bilateral;     Social History:   reports that he has been smoking cigars and cigarettes. He has been exposed to tobacco smoke. He has never used smokeless tobacco. He reports that he does not currently use alcohol after a past usage of about 14.0 standard drinks of alcohol per week. He reports current drug use. Frequency: 1.00 time per week. Drugs: Marijuana and Cocaine .   Family History:  His family history includes Heart attack (age of onset: 19) in his sister; Heart disease in his mother.   Allergies Allergies  Allergen Reactions   Neurontin  [Gabapentin ] Other (See Comments)    Encephalopathy Tremor    Vasotec  [Enalapril ] Other (See Comments)    Renal failure syndrome   Iodinated Contrast Media Nausea And Vomiting     Home Medications  Prior to Admission medications   Medication Sig Start Date End Date Taking? Authorizing Provider  acetaminophen  (TYLENOL ) 500 MG tablet Take 500-1,000 mg by mouth 2 (two) times daily as needed for moderate pain (pain score 4-6), fever or headache.  [provider]  albuterol  (VENTOLIN  HFA) 108 (90 Base) MCG/ACT inhaler Inhale 2 puffs into the lungs every 6 (six) hours as needed (COPD). Patient not taking: Reported on 04/11/2024    [provider]  ARIPiprazole  (ABILIFY ) 5 MG tablet Take 1 tablet (5 mg  total) by mouth daily. 04/15/24   King, Olivia, DO  diclofenac  Sodium (VOLTAREN ) 1 % GEL Apply 4 g topically 4 (four) times daily for 8 days. 04/15/24 04/23/24  Myrna Bitters, DO  lactulose  (CHRONULAC ) 10 GM/15ML solution Take 15 mLs (10 g total) by mouth daily as needed for mild constipation (If he becomes increasingly confused). 04/15/24 05/15/24  Myrna Bitters, DO  lidocaine  (LIDODERM ) 5 % Place 1 patch onto the skin daily. Remove & Discard patch within 12 hours or as directed by MD 04/15/24   Myrna Bitters, DO  nicotine  (NICODERM CQ  - DOSED IN MG/24 HOURS) 14 mg/24hr patch Place 1 patch (14 mg total) onto the skin daily at 6 (six) AM. 04/26/2024   Myrna Bitters, DO  ondansetron  (ZOFRAN ) 4 MG tablet Take 1 tablet (4 mg total) by mouth every 8 (eight) hours as needed for up to 30 doses for nausea or vomiting. 04/15/24   King, Olivia, DO  oxyCODONE -acetaminophen  (PERCOCET/ROXICET) 5-325 MG tablet Take 1 tablet by mouth every 4 (four) hours as needed for severe pain (pain score 7-10). Patient not taking: Reported on 04/11/2024 04/06/24   Patsey Lot, MD     Critical care time: 50 mins     [facility] 04/15/2024, 8:43 PM  Please see [email or web address] for pager details.  From 7A-7P if no response, please call [phone]. After hours, please call ELink [phone].

## 2024-04-15 NOTE — Progress Notes (Signed)
 [facility] Progress Note   Subjective:    Seen and examined patient at bedside. On RA but very frail. Mumbling a few words this morning. Informed by RN of patient being discharged home with hospice likely today BUT will continue hemodialysis. Next HD 9/6 in outpatient if he leaves today. Otherwise, will resume HD here only if he remains inpatient. Noted he agreed to run HD for 2hrs yesterday. was removed.  Objective Vitals:   04/15/24 0336 04/15/24 0511 04/15/24 0748 04/15/24 0803  BP:  96/73  114/72  Pulse: 85 85  77  Resp: 16     Temp:    98 F (36.7 C)  TempSrc:      SpO2:  (!) 88% 92%   Weight:      Height:       Physical Exam General: Frail man, NAD. Room air Heart: RRR Lungs: CTA anteriorly Abdomen: distended/tense Extremities: no LE edema Dialysis Access:  [facility]  Filed Weights   04/13/24 0454 04/14/24 1615 04/14/24 1730  Weight: 52.1 kg 52 kg 52.4 kg    Intake/Output Summary (Last 24 hours) at 04/15/2024 1151 Last data filed at 04/14/2024 1745 Gross per 24 hour  Intake --  Output 600 ml  Net -600 ml    Additional Objective Labs: Basic Metabolic Panel: Recent Labs  Lab 04/09/24 0345 04/10/24 0957 04/12/24 1949 04/14/24 1611  NA 132* 134* 133* 136  K 5.5* 4.4 3.9 4.0  CL 93* 92* 94* 91*  CO2 23 27 27 28   GLUCOSE 98 134* 107* 148*  BUN 62* 46* 44* 70*  CREATININE 5.68* 4.33* 3.88* 5.59*  CALCIUM  9.5 9.3 9.0 8.9  PHOS 7.4*  --   --  4.0   Liver Function Tests: Recent Labs  Lab 04/09/24 0345 04/12/24 1949 04/14/24 1611  AST  --  21  --   ALT  --  18  --   ALKPHOS  --  110  --   BILITOT  --  0.7  --   PROT  --  6.0*  --   ALBUMIN  2.4* 2.3* 2.1*   No results for input(s): LIPASE, AMYLASE in the last 168 hours. CBC: Recent Labs  Lab 04/10/24 0957 04/12/24 0830 04/13/24 1841 04/14/24 1611 04/15/24 0822  WBC 5.3 6.4 10.8* 8.0 9.2  NEUTROABS 4.4  --   --   --   --   HGB 13.1 12.3* 13.4 12.2* 13.1  HCT 38.8* 36.5*  39.6 36.2* 38.4*  MCV 78.1* 78.7* 78.9* 77.8* 77.3*  PLT 122* 115* 104* 161 132*   Blood Culture    Component Value Date/Time   SDES BLOOD LEFT ANTECUBITAL 04/11/2024 1118   SPECREQUEST  04/11/2024 1118    BOTTLES DRAWN AEROBIC ONLY Blood Culture results may not be optimal due to an inadequate volume of blood received in culture bottles   CULT  04/11/2024 1118    NO GROWTH 4 DAYS Performed at [facility], [address], [place], [place] [address]    REPTSTATUS PENDING 04/11/2024 1118    Cardiac Enzymes: No results for input(s): CKTOTAL, CKMB, CKMBINDEX, TROPONINI in the last 168 hours. CBG: Recent Labs  Lab 04/11/24 0908 04/13/24 0733 04/14/24 0802 04/14/24 1152 04/15/24 0806  GLUCAP 127* 123* 149* 140* 100*   Iron Studies: No results for input(s): IRON, TIBC, TRANSFERRIN, FERRITIN in the last 72 hours. Lab Results  Component Value Date   INR 1.5 (H) 01/24/2024   INR 1.3 (H) 03/18/2023   INR 1.2  03/13/2023   Studies/Results: No results found.  Medications:   acetaminophen   1,000 mg Oral Q8H   apixaban   5 mg Oral BID   arformoterol   15 mcg Nebulization BID   And   umeclidinium bromide   1 puff Inhalation Daily   ARIPiprazole   5 mg Oral Daily   Chlorhexidine  Gluconate Cloth  6 each Topical Q0600   cinacalcet   30 mg Oral Q T,Th,Sa-HD   diclofenac  Sodium  2 g Topical QID   feeding supplement (NEPRO CARB STEADY)  237 mL Oral BID BM   lactulose   10 g Oral Daily   lidocaine   1 patch Transdermal Q24H   nicotine   14 mg Transdermal Q0600   ondansetron   4 mg Oral Once   sevelamer  carbonate  1,600 mg Oral TID WC   thiamine   100 mg Oral Daily    Dialysis Orders: [facility] TTS 4:00 BFR 400 EDW 62 kg 2K/2Ca TDC - Heparin  4000 + 2000 midrun  - calcitriol  2.25 q HD, sensipar  30 q HD  Assessment/Plan: Hyperkalemia: S/p urgent HD on admit, improving. AMS: Waxes/wanes.Liver dz playing role. ESRD: Usual TTS schedule - Next HD 9/6 in outpatient if he I  discharged today. Noted he agreed to run dialysis for 2hrs yesterday. HTN/volume: BP good, now well prior prior EDW - lower on discharge. Anemia of ESRD: Hgb > 12, no ESA needed Secondary HPTH: CorrCa and Phos high - holding VDRA, continue sensipar  and binders (sevelamer ) Nutrition: Alb low, continue supps. HFrEF pA-fib: On Eliquis  Diarrhea: Per primary. Substance abuse disorder Dispo: RN informs me patient will be going home with hospice (likely today) BUT will continue HD. Next HD tomorrow in outpatient if he is discharged today. Noted he refused to go to a SNF. If he doesn't leave today, will order HD here for tomorrow.  Charmaine Piety, NP [facility] 04/15/2024,11:51 AM  LOS: 7 days

## 2024-04-15 NOTE — Progress Notes (Signed)
 Pharmacy Antibiotic Note  Ronald Reeves is a 68 y.o. male admitted on 04/15/2024 with aspiration pneumonia.  Pharmacy has been consulted for piperacillin /tazobactam  dosing. Note patient is on dialysis.   Plan: piperacillin nadine 2.25g q8hr EI Monitor cultures, narrow abx as able and f/u duration      Temp (24hrs), Avg:98 F (36.7 C), Min:98 F (36.7 C), Max:98 F (36.7 C)  Recent Labs  Lab 04/12/24 0830 04/12/24 1949 04/13/24 1841 04/14/24 1611 04/15/24 0822 04/15/24 1149 04/15/24 2042 04/15/24 2044  WBC 6.4  --  10.8* 8.0 9.2  --  6.7  --   CREATININE  --  3.88*  --  5.59*  --  5.42* 5.58* 5.10*    Estimated Creatinine Clearance: 10.6 mL/min (A) (by C-G formula based on SCr of 5.1 mg/dL (H)).    Allergies  Allergen Reactions   Neurontin  [Gabapentin ] Other (See Comments)    Encephalopathy Tremor    Vasotec  [Enalapril ] Other (See Comments)    Renal failure syndrome   Iodinated Contrast Media Nausea And Vomiting    Antimicrobials this admission: piptazo 9/5 >>    Microbiology results: 9/5 BCx:  9/5  Sputum:   9/5 MRSA PCR:   Thank you for allowing pharmacy to be a part of this patient's care.  Jinnie Door, PharmD, BCPS, BCCP Clinical Pharmacist  Please check AMION for all [facility] Pharmacy phone numbers After 10:00 PM, call Main Pharmacy [phone]

## 2024-04-15 NOTE — ED Triage Notes (Signed)
 Pt to ED via EMS with c/o SOB and hemoptysis. EMS states pt was discharged from hospital today to hospice at home. Pt A&Ox4.

## 2024-04-15 NOTE — Progress Notes (Addendum)
 eLink Physician-Brief Progress Note Patient Name: Ronald Reeves DOB: 04-Jan-1956 MRN: [id number]   Date of Service  04/15/2024  HPI/Events of Note  Rescinded home hospice and returned to the hospital, got intubated, now on vasopressors Norepinephrine  ordered at central dosing, but has been running peripherally at 10+ micrograms  eICU Interventions  Request current team evaluation for potential central line Added peripheral norepinephrine  precautions with elevated ceiling   0248 - restraints as needed for safety  0547 -low-dose dextrose  drip due to hypoglycemia.   Intervention Category Minor Interventions: Routine modifications to care plan (e.g. PRN medications for pain, fever)  Eda Magnussen 04/15/2024, 11:58 PM

## 2024-04-15 NOTE — Discharge Summary (Signed)
 Name: Shabazz Mckey MRN: [id number] DOB: 07-30-1956 68 y.o. PCP: [facility]  Date of Admission: 04/07/2024  8:10 AM Date of Discharge: 04/15/2024 Attending Physician: Dr. Jone Dauphin  Discharge Diagnosis: 1. Principal Problem:   Acute metabolic encephalopathy Active Problems:   Essential hypertension   Alcohol use disorder, moderate, in sustained remission (HCC)   Cocaine  use disorder, severe, dependence (HCC)   ESRD (end stage renal disease) on dialysis TTS schedule (HCC)   HFrEF (heart failure with reduced ejection fraction) (HCC)   MDD (major depressive disorder), recurrent severe, without psychosis (HCC)   Paroxysmal atrial fibrillation (HCC)   Thrombocytopenia (HCC)   Ascites   Decreased mobility   Diarrhea   Discharge Medications: Allergies as of 04/15/2024       Reactions   Neurontin  [gabapentin ] Other (See Comments)   Encephalopathy Tremor    Vasotec  [enalapril ] Other (See Comments)   Renal failure syndrome   Iodinated Contrast Media Nausea And Vomiting        Medication List     STOP taking these medications    ADVIL  DUAL ACTION PO   amLODipine  10 MG tablet Commonly known as: NORVASC    apixaban  5 MG Tabs tablet Commonly known as: ELIQUIS    carvedilol  12.5 MG tablet Commonly known as: COREG    escitalopram  10 MG tablet Commonly known as: LEXAPRO    furosemide  80 MG tablet Commonly known as: LASIX    HYDROcodone -acetaminophen  10-325 MG tablet Commonly known as: NORCO   isosorbide  mononitrate 30 MG 24 hr tablet Commonly known as: IMDUR    mirtazapine  15 MG tablet Commonly known as: REMERON    mometasone 220 MCG/ACT inhaler Commonly known as: ASMANEX   omeprazole  40 MG capsule Commonly known as: PRILOSEC   ondansetron  8 MG disintegrating tablet Commonly known as: ZOFRAN -ODT   rosuvastatin  10 MG tablet Commonly known as: CRESTOR    sevelamer  carbonate 800 MG tablet Commonly known as: RENVELA    sorbitol 70 % solution    tamsulosin  0.4 MG Caps capsule Commonly known as: FLOMAX    Tiotropium Bromide-Olodaterol 2.5-2.5 MCG/ACT Aers   VITAMIN C PO       TAKE these medications    acetaminophen  500 MG tablet Commonly known as: TYLENOL  Take 500-1,000 mg by mouth 2 (two) times daily as needed for moderate pain (pain score 4-6), fever or headache.   albuterol  108 (90 Base) MCG/ACT inhaler Commonly known as: VENTOLIN  HFA Inhale 2 puffs into the lungs every 6 (six) hours as needed (COPD).   ARIPiprazole  5 MG tablet Commonly known as: ABILIFY  Take 1 tablet (5 mg total) by mouth daily.   Constulose  10 GM/15ML solution Generic drug: lactulose  Take 15 mLs (10 g total) by mouth daily as needed for mild constipation (If he becomes increasingly confused).   diclofenac  Sodium 1 % Gel Commonly known as: VOLTAREN  Apply 4 g topically 4 (four) times daily for 8 days.   lidocaine  5 % Commonly known as: LIDODERM  Place 1 patch onto the skin daily. Remove & Discard patch within 12 hours or as directed by MD What changed:  when to take this reasons to take this additional instructions   nicotine  14 mg/24hr patch Commonly known as: NICODERM CQ  - dosed in mg/24 hours Place 1 patch (14 mg total) onto the skin daily at 6 (six) AM. Start taking on: April 16, 2024   ondansetron  4 MG tablet Commonly known as: ZOFRAN  Take 1 tablet (4 mg total) by mouth every 8 (eight) hours as needed for up to 30 doses for nausea or  vomiting.   oxyCODONE -acetaminophen  5-325 MG tablet Commonly known as: PERCOCET/ROXICET Take 1 tablet by mouth every 4 (four) hours as needed for severe pain (pain score 7-10).        Disposition and follow-up:   Mr.Torey Eves was discharged from [facility] in Stable condition.  At the hospital follow up visit please address:  Comfort--the patient was discharged with home hospice. Ensure the patient is comfortable and taking his pain medication as needed.  Hepatic  encephalopathy--the patient was treated with lactulose  for encephalopathy. If the patient becomes encephalopathic while at home, he can be given lactulose  as needed  Follow-up Appointments:  Follow-up Information     [facility] Call.   Contact information: [address] [facility] [place] [address] [phone]                  Hospital Course by problem list: Ascher Schroepfer is a 68 y.o. person living with a history of ESRD with on HD, hypertension, paroxysmal A-fib, heart failure with a reduced ejection fraction, prior CVA, and polysubstance use who presented with encephalopathy and admitted for toxic metabolic encephalopathy now being discharged on hospital day 7 with the following pertinent hospital course:  Toxic metabolic encephalopathy Polysubstance abuse Hepatic encephalopathy Patient was admitted on 8/28 after being brought to the ER by his wife for increasing confusion.  He does have quite an extensive medical history including ESRD on HD for which he is intermittently noncompliant and polysubstance use including daily cocaine  use.  Upon presentation he was increasingly somnolent and incoherent when talking to staff.  It was thought that this was due to missed dialysis session due to electrolyte abnormalities and increased BUN.  He did undergo urgent hemodialysis that day. He was evaluated with minimal improvement to his encephalopathy.  He was initiated on Abilify  and nightly Seroquel  as needed for acute agitation.  He was also hypoglycemic and treated accordingly.  On 8/30 he was evaluated in the morning and was lucid and alert and oriented x 4.  However later that afternoon he was seen with increasing confusion once again.  At this time he was initiated on lactulose  due to history of cirrhosis for treatment of possible hepatic encephalopathy.  Over the next few days he did have a pattern of waxing and waning mentation.  This was likely due to partly to  hepatic encephalopathy due to improvement with lactulose  but also hospital-acquired delirium.   Multisystem organ failure End-of-life care The patient does have a long history of multisystem organ failure including heart failure with reduced ejection fraction, cirrhosis, and ESRD on HD.  He was seen on 9/2 where he was alert and oriented and desired to continue dialysis but go home on hospice. He did verbalize an understanding of his health including multiple system organ failure and his poor prognosis overall.  At this time palliative care was consulted.  Palliative care did see the patient on 9/4 and discussed with him, his wife, and his son about the patient's prognosis and beginning end-of-life care.  The patient was adamant that he would like to go home on home hospice and not go to SNF.  Discussed with the son on 9/5 who agreed that it was the patient's desire to go home he would like to honor those wishes and would be home to take care of him along with his sister.  The patient was deemed stable for discharge and accepted by [facility].   Subjective The patient was seen  and examined at the bedside this morning.  He was alert and oriented x 4 and stated that he would like to go home today with his son.  He told me his son's phone number and I could call him to confirm.  He denied any pain or acute distress.  Discharge Exam:   BP 114/72   Pulse 77   Temp 98 F (36.7 C)   Resp 16   Ht 5' 11 (1.803 m)   Wt 52.4 kg   SpO2 92%   BMI 16.11 kg/m  Discharge exam:  Const: Awake, alert in NAD HENT: Normocephalic, atraumatic, mucus membranes moist Card: regular rate, pericardial friction rub hear along left sternal border Resp: LCTAB, no increased work of breathing Abd: firm, distended and tympanic. Non-tender to palpation Extremities: Warm, pink   Pertinent Labs, Studies, and Procedures:     Latest Ref Rng & Units 04/15/2024    8:22 AM 04/14/2024    4:11 PM 04/13/2024    6:41 PM  CBC   WBC 4.0 - 10.5 K/uL 9.2  8.0  10.8   Hemoglobin 13.0 - 17.0 g/dL 86.8  87.7  86.5   Hematocrit 39.0 - 52.0 % 38.4  36.2  39.6   Platelets 150 - 400 K/uL 132  161  104        Latest Ref Rng & Units 04/15/2024   11:49 AM 04/14/2024    4:11 PM 04/12/2024    7:49 PM  CMP  Glucose 70 - 99 mg/dL 889  851  892   BUN 8 - 23 mg/dL 66  70  44   Creatinine 0.61 - 1.24 mg/dL 4.57  4.40  6.11   Sodium 135 - 145 mmol/L 136  136  133   Potassium 3.5 - 5.1 mmol/L 4.7  4.0  3.9   Chloride 98 - 111 mmol/L 92  91  94   CO2 22 - 32 mmol/L 25  28  27    Calcium  8.9 - 10.3 mg/dL 9.4  8.9  9.0   Total Protein 6.5 - 8.1 g/dL   6.0   Total Bilirubin 0.0 - 1.2 mg/dL   0.7   Alkaline Phos 38 - 126 U/L   110   AST 15 - 41 U/L   21   ALT 0 - 44 U/L   18     US  ABDOMEN LIMITED WITH LIVER DOPPLER Result Date: 04/08/2024 CLINICAL DATA:  Ascites. EXAM: DUPLEX ULTRASOUND OF LIVER TECHNIQUE: Color and duplex Doppler ultrasound was attempted to evaluate the hepatic in-flow and out-flow vessels. Patient terminated the exam prior to completion. COMPARISON:  None Available. FINDINGS: Liver: Not well assessed on the current exam. Main Portal Vein size: Not assessed on the current exam. Portal Vein Velocities Patient terminated the exam prior to assessment. Hepatic Vein Velocities Right:  53 cm/sec Middle:  36.8 cm/sec Left: Not assessed, patient terminated the exam prior to assessment. IVC: Not assessed, patient terminated the exam prior to assessment. Hepatic Artery Velocity: Not assessed, patient terminated the exam prior to assessment. Splenic Vein Velocity: Not assessed, patient terminated the exam prior to assessment. Spleen: Not assessed. Portal Vein Occlusion/Thrombus: Not assessed. Splenic Vein Occlusion/Thrombus: Not assessed. Ascites: Present in the right upper quadrant. Varices: Not assessed. IMPRESSION: 1. Right upper quadrant ascites. 2. Patent right and middle hepatic veins. Patient terminated the exam prior to  interrogation of the remaining vasculature. Electronically Signed   By: Andrea Gasman M.D.   On: 04/08/2024 21:30   DG Chest  Portable 1 View Result Date: 04/07/2024 EXAM: 1 VIEW XRAY OF THE CHEST 04/07/2024 11:04:35 AM COMPARISON: 04/06/2024 CLINICAL HISTORY: SOB, dialysis. Per chart - Pt here for a unwitnessed fall. Pt unsure if he hit his head. Denies LOC. No lacerations or abrasions noted to head. Pt is on eliquis  and plavix. Axox4. Pts last full dialysis treatment was yesterday per pt. Pt states he did cocaine  last night. FINDINGS: LUNGS AND PLEURA: Left basilar airspace opacities are improving. No pleural effusion. No pneumothorax. HEART AND MEDIASTINUM: The heart is enlarged. Atherosclerotic changes are again noted at the aortic arch. BONES AND SOFT TISSUES: No acute osseous abnormality. LINES AND TUBES: A right IJ dialysis catheter is stable and in appropriate position. IMPRESSION: 1. Improving left basilar airspace opacities. 2. Enlarged heart. 3. Atherosclerotic changes at the aortic arch. Electronically signed by: [name] Necessary MD 04/07/2024 11:13 AM EDT RP Workstation: [id number]   CT Head Wo Contrast Result Date: 04/07/2024 CLINICAL DATA:  Head trauma, minor (Age >= 65y) EXAM: CT HEAD WITHOUT CONTRAST TECHNIQUE: Contiguous axial images were obtained from the base of the skull through the vertex without intravenous contrast. RADIATION DOSE REDUCTION: This exam was performed according to the departmental dose-optimization program which includes automated exposure control, adjustment of the mA and/or kV according to patient size and/or use of iterative reconstruction technique. COMPARISON:  None Available. FINDINGS: Brain: No evidence of acute infarction, hemorrhage, hydrocephalus, extra-axial collection or mass lesion/mass effect. Patchy white matter hypodensities, compatible with chronic microvascular ischemic disease. Vascular: Calcific atherosclerosis. Skull: No acute fracture.  Sinuses/Orbits: Mostly clear sinuses.  No orbital fracture. IMPRESSION: No evidence of acute intracranial abnormality. Electronically Signed   By: Gilmore GORMAN Molt M.D.   On: 04/07/2024 09:53     Discharge Instructions: Discharge Instructions     Diet - low sodium heart healthy   Complete by: As directed    Discharge instructions   Complete by: As directed    Thank you for allowing us  to be part of your care. You were hospitalized for Confusion. We treated you with dialysis, lactulose , and medications.   You are going home with hospice and have elected to continue dialysis.  *For your comfort -We have continued you on these following medications:  - Nicotine  patch-Place 1 patch on the skin per day.  - Zofran  4 mg-take 1 pill as needed up to every 8 hours for nausea.  - Voltaren  gel-apply to area that is causing pain for up to 4 times a day  -Lidocaine  patch-Place 1 patch onto the skin over the area that is causing pain once per day  *For your confusion -We have STARTED you on these following medications:  - Lactulose -take 15 mL daily.   - This medication is for something called hepatic encephalopathy which we believe was contributing to his confusion while in the hospital.  This medication works by allowing the patient to have bowel movements which raise the body of toxins which cause confusion.  We recommend taking this medication as needed.  If you notice him becoming increasingly confused or agitated you may give it to him and hopefully notice improvement within the next 24 hours   -Your father has elected to go home on hospice while understanding that this means that he is in the process of dying.  The primary goal should be keeping him comfortable and without pain.  If you notice issues or have concerns you may contact the hospice agency who can answer many questions and assist you.  Please make  sure to continue your dialysis as long as you wish  Please call your PCP or our clinic  if you have any questions or concerns, we may be able to help and keep you from a long and expensive emergency room wait. Our clinic and after hours phone number is [phone]. The best time to call is Monday through Friday 9 am to 4 pm but there is always someone available 24/7 if you have an emergency. If you need medication refills please notify your pharmacy one week in advance and they will send us  a request.   We are glad you are feeling better,  [name], DO Internal Medicine Inpatient Teaching Service at [facility]   Increase activity slowly   Complete by: As directed        Signed: Novak Schuyler, DO 04/15/2024, 1:28 PM

## 2024-04-15 NOTE — ED Notes (Signed)
 Arterial line placed by Rogelia, MD. RT at bedside.

## 2024-04-15 NOTE — Progress Notes (Signed)
 Pt taken off unit via stretcher.  Discharged to home with hospice in stable condition with all belongings.

## 2024-04-15 NOTE — TOC Transition Note (Signed)
 Transition of Care [facility]) - Discharge Note   Patient Details  Name: Ronald Reeves MRN: [id number] Date of Birth: Sep 23, 1955  [facility]) CM/SW Contact:  Roxie KANDICE Stain, RN Phone Number: 04/15/2024, 12:29 PM   Clinical Narrative:    Patient stable to discharge home with hospice.  [name] with [facility]  home hospice can accept patient on dialysis.Randine , renal navigator, is aware of discharge and need for dialysis tomorrow. DME will be delivered by 1400. PTAR notified of discharge.  Spoke to daughter, [name], and son [name] regarding discharge.  Address, Phone number and PCP verified.   Final next level of care: Home w Hospice Care Barriers to Discharge: Barriers Resolved   Patient Goals and CMS Choice Patient states their goals for this hospitalization and ongoing recovery are:: get home CMS Medicare.gov Compare Post Acute Care list provided to:: Patient Choice offered to / list presented to : Patient      Discharge Placement  Home                     Discharge Plan and Services Additional resources added to the After Visit Summary for   In-house Referral: Clinical Social Work   Post Acute Care Choice: Skilled Nursing Facility                      [facility] Agency: Other - See comment [facility]) Date HH Agency Contacted: 04/15/24 Time HH Agency Contacted: 0930 Representative spoke with at [facility] Agency: [name]  Social Drivers of Health (SDOH) Interventions SDOH Screenings   Food Insecurity: No Food Insecurity (04/07/2024)  Housing: Low Risk  (04/07/2024)  Transportation Needs: Unmet Transportation Needs (04/07/2024)  Utilities: Not At Risk (04/07/2024)  Financial Resource Strain: Low Risk  (02/25/2024)  Social Connections: Moderately Integrated (04/07/2024)  Stress: No Stress Concern Present (05/22/2023)   Received from [facility]  Tobacco Use: High Risk (04/07/2024)  Health Literacy: Adequate Health Literacy (02/25/2024)      Readmission Risk Interventions     No data to display

## 2024-04-15 NOTE — ED Notes (Signed)
 Patient blood pressures becoming unstable. Pt transport to ICU at this time.

## 2024-04-15 NOTE — ED Notes (Signed)
Soft wrist restraints placed on patient.

## 2024-04-15 NOTE — ED Notes (Signed)
 Transfer to ICU delayed. Waiting on portable XR.

## 2024-04-15 NOTE — ED Notes (Signed)
 Levophed  started at 10mcg per verbal order from Rogelia, MD at bedside.

## 2024-04-16 ENCOUNTER — Inpatient Hospital Stay (HOSPITAL_COMMUNITY)

## 2024-04-16 DIAGNOSIS — R042 Hemoptysis: Secondary | ICD-10-CM | POA: Insufficient documentation

## 2024-04-16 DIAGNOSIS — Z515 Encounter for palliative care: Secondary | ICD-10-CM

## 2024-04-16 DIAGNOSIS — J9601 Acute respiratory failure with hypoxia: Secondary | ICD-10-CM

## 2024-04-16 DIAGNOSIS — I5020 Unspecified systolic (congestive) heart failure: Secondary | ICD-10-CM

## 2024-04-16 DIAGNOSIS — R579 Shock, unspecified: Secondary | ICD-10-CM | POA: Insufficient documentation

## 2024-04-16 DIAGNOSIS — K7460 Unspecified cirrhosis of liver: Secondary | ICD-10-CM

## 2024-04-16 LAB — GLUCOSE, CAPILLARY
Glucose-Capillary: 62 mg/dL — ABNORMAL LOW (ref 70–99)
Glucose-Capillary: 97 mg/dL (ref 70–99)

## 2024-04-16 LAB — BASIC METABOLIC PANEL WITH GFR
Anion gap: 19 — ABNORMAL HIGH (ref 5–15)
BUN: 76 mg/dL — ABNORMAL HIGH (ref 8–23)
CO2: 23 mmol/L (ref 22–32)
Calcium: 8.7 mg/dL — ABNORMAL LOW (ref 8.9–10.3)
Chloride: 94 mmol/L — ABNORMAL LOW (ref 98–111)
Creatinine, Ser: 5.45 mg/dL — ABNORMAL HIGH (ref 0.61–1.24)
GFR, Estimated: 11 mL/min — ABNORMAL LOW (ref >60)
Glucose, Bld: 95 mg/dL (ref 70–99)
Potassium: 5 mmol/L (ref 3.5–5.1)
Sodium: 136 mmol/L (ref 135–145)

## 2024-04-16 LAB — MAGNESIUM: Magnesium: 1.8 mg/dL (ref 1.7–2.4)

## 2024-04-16 LAB — BLOOD CULTURE ID PANEL (REFLEXED) - BCID2

## 2024-04-16 LAB — CBC
HCT: 36.2 % — ABNORMAL LOW (ref 39.0–52.0)
Hemoglobin: 12.3 g/dL — ABNORMAL LOW (ref 13.0–17.0)
MCH: 26.2 pg (ref 26.0–34.0)
MCHC: 34 g/dL (ref 30.0–36.0)
MCV: 77 fL — ABNORMAL LOW (ref 80.0–100.0)
Platelets: 154 K/uL (ref 150–400)
RBC: 4.7 MIL/uL (ref 4.22–5.81)
RDW: 17 % — ABNORMAL HIGH (ref 11.5–15.5)
WBC: 5.1 K/uL (ref 4.0–10.5)
nRBC: 0 % (ref 0.0–0.2)

## 2024-04-16 LAB — CULTURE, BLOOD (ROUTINE X 2)
Culture: NO GROWTH
Culture: NO GROWTH

## 2024-04-16 LAB — MRSA NEXT GEN BY PCR, NASAL: MRSA by PCR Next Gen: NOT DETECTED

## 2024-04-16 LAB — PHOSPHORUS: Phosphorus: 4.2 mg/dL (ref 2.5–4.6)

## 2024-04-16 LAB — TROPONIN I (HIGH SENSITIVITY): Troponin I (High Sensitivity): 103 ng/L (ref <18)

## 2024-04-16 LAB — TRIGLYCERIDES: Triglycerides: 50 mg/dL (ref <150)

## 2024-04-16 MED ORDER — POLYVINYL ALCOHOL 1.4 % OP SOLN: 1.0000 [drp] | Freq: Four times a day (QID) | OPHTHALMIC | Status: DC | PRN

## 2024-04-16 MED ORDER — GLYCOPYRROLATE 0.2 MG/ML IJ SOLN
0.2000 mg | INTRAMUSCULAR | Status: DC | PRN
  Administered 2024-04-16: 0.2 mg via INTRAVENOUS
  Filled 2024-04-16: qty 1

## 2024-04-16 MED ORDER — GLYCOPYRROLATE 1 MG PO TABS: 1.0000 mg | ORAL_TABLET | ORAL | Status: DC | PRN

## 2024-04-16 MED ORDER — LORAZEPAM 2 MG/ML IJ SOLN
INTRAMUSCULAR | Status: AC
  Filled 2024-04-16: qty 1

## 2024-04-16 MED ORDER — GLYCOPYRROLATE 0.2 MG/ML IJ SOLN: 0.2000 mg | INTRAMUSCULAR | Status: DC | PRN

## 2024-04-16 MED ORDER — ACETAMINOPHEN 650 MG RE SUPP: 650.0000 mg | Freq: Four times a day (QID) | RECTAL | Status: DC | PRN

## 2024-04-16 MED ORDER — MORPHINE 100MG IN NS 100ML (1MG/ML) PREMIX INFUSION
0.0000 mg/h | INTRAVENOUS | Status: DC
  Administered 2024-04-16: 5 mg/h via INTRAVENOUS
  Filled 2024-04-16: qty 100

## 2024-04-16 MED ORDER — LORAZEPAM 2 MG/ML IJ SOLN
2.0000 mg | INTRAMUSCULAR | Status: DC | PRN
  Administered 2024-04-16: 2 mg via INTRAVENOUS

## 2024-04-16 MED ORDER — MORPHINE BOLUS VIA INFUSION
5.0000 mg | INTRAVENOUS | Status: DC | PRN
  Administered 2024-04-16 (×6): 5 mg via INTRAVENOUS

## 2024-04-16 MED ORDER — SODIUM CHLORIDE 0.9 % IV SOLN: INTRAVENOUS | Status: DC

## 2024-04-16 MED ORDER — ACETAMINOPHEN 325 MG PO TABS
650.0000 mg | ORAL_TABLET | Freq: Four times a day (QID) | ORAL | Status: DC | PRN
Start: 2024-04-16 — End: 2024-04-16

## 2024-04-16 MED ORDER — DEXTROSE 10 % IV SOLN: INTRAVENOUS | Status: DC

## 2024-04-18 LAB — CULTURE, BLOOD (ROUTINE X 2)

## 2024-04-21 LAB — CULTURE, BLOOD (ROUTINE X 2)

## 2024-05-11 NOTE — Progress Notes (Signed)
 Discussed with PCCM. Plan to transition to comfort care, compassionate extubation planned for today. Will d/c consult. Please call with any questions/concerns in the interim.  Ephriam Stank, MD [facility]

## 2024-05-11 NOTE — Accreditation Note (Signed)
 Death within 24 hours of discontinuation of bilateral soft wrist restraints logged on 04/29/2024 at 0947 by Valentin Lai RN

## 2024-05-11 NOTE — Consult Note (Signed)
 Consultation Note Date: Apr 23, 2024   Patient Name: Ronald Reeves  DOB: 1956/03/04  MRN: [id number]  Age / Sex: 68 y.o., male  PCP: [facility] Referring Physician: Kassie Acquanetta Bradley, MD  Reason for Consultation:  GOC- patient was discharged on 9/5 afternoon with hospice Patient [facility] ED on 9/5 with respiratory distress resends DNR>full code, now intubated  HPI/Patient Profile: 68 y.o. male  with past medical history of ESRD on HD, cocaine  use, recurrent DVT, HFrEF - 20-25%, prior CVA, cirrhosis admitted on 04/15/2024 with respiratory failure after being discharged home with hospice. He rescinded DNR and hospice in ED and was intubated. Palliative consulted for GOC.    Primary Decision Maker NEXT OF KIN  Discussion: Chart reviewed including labs, progress notes, imaging from this and previous encounters.  Per discussion with attending team patient was extubated to comfort measures only this morning. His spouse is in ICU also and was able to be with him last night.  Met with patient's three daughters at bedside. Emotional support provided.  On evaluation patient appeared comfortable, actively dying with periods of apnea.  Answered daughter's questions related to planning for after patient's death.  No other needs at this time.     SUMMARY OF RECOMMENDATIONS -Continue current interventions- comfort measures only    Code Status/Advance Care Planning:   Code Status: Do not attempt resuscitation (DNR) - Comfort care    Prognosis:   Hours - Days  Discharge Planning: Anticipated Hospital Death  Primary Diagnoses: Present on Admission:  Acute respiratory failure (HCC)  ESRD (end stage renal disease) (HCC)  HFrEF (heart failure with reduced ejection fraction) (HCC)  Paroxysmal atrial fibrillation (HCC)  Thrombocytopenia (HCC)  Moderate protein-calorie malnutrition (HCC)   Review of  Systems  Unable to perform ROS: Mental status change    Physical Exam Vitals and nursing note reviewed.  Constitutional:      Comments: Frail, cachectic  Pulmonary:     Comments: Periods of apnea Neurological:     Comments: unresponsive     Vital Signs: BP 103/63   Pulse 87   Temp 98.4 F (36.9 C)   Resp (!) 0   SpO2 (!) 84%  Pain Scale: CPOT       SpO2: SpO2:  (unable to obtain sats, MD are aware) O2 Device:SpO2:  (unable to obtain sats, MD are aware) O2 Flow Rate: .   IO: Intake/output summary:  Intake/Output Summary (Last 24 hours) at 04-23-24 1331 Last data filed at 04/23/24 1125 Gross per 24 hour  Intake 708.5 ml  Output --  Net 708.5 ml    LBM: Last BM Date : 04/15/24 Baseline Weight:   Most recent weight:         Thank you for this consult. Palliative medicine will continue to follow and assist as needed.   Signed by: Cassondra Stain, AGNP-C Palliative Medicine  Time includes:   Preparing to see the patient (e.g., review of tests) Obtaining and/or reviewing separately obtained history Performing a medically necessary appropriate examination and/or  evaluation Counseling and educating the patient/family/caregiver Ordering medications, tests, or procedures Referring and communicating with other health care professionals (when not reported separately) Documenting clinical information in the electronic or other health record Independently interpreting results (not reported separately) and communicating results to the patient/family/caregiver Care coordination (not reported separately) Clinical documentation   Please contact Palliative Medicine Team phone at [phone] for questions and concerns.  For individual provider: See Tracey

## 2024-05-11 NOTE — Progress Notes (Signed)
 Compassionate extubation performed at this time. Family at bedside.

## 2024-05-11 NOTE — Progress Notes (Signed)
 Chaplain responded to page request for prayer. Wife (also a pt), 2 daughters, son and RN present. Also created space for some initial life story sharing.  Chaplains remain available as further needs arise.

## 2024-05-11 NOTE — Progress Notes (Signed)
 Patient asystole on monitor. No breath sounds or heart sounds auscultated for one minute by this RN and Harlene Lombard, RN. Slater Staff, MD made aware. Family at the bedside.   TOD: 1125

## 2024-05-11 NOTE — IPAL (Signed)
  Interdisciplinary Goals of Care Family Meeting   Date carried out:: 04-27-24  Location of the meeting: Unit  Member's involved: Nurse Practitioner and Family Member or next of kin- Children of patient: Pamela,Christopher, Roselind  Wife also admitted in ICU, unable to make GOC for patient at this time due to also being critically ill.   Durable Power of Attorney or acting medical decision maker: [name] (oldest) child- daughter   Discussion: We discussed goals of care for Ronald Reeves. Discussed with [name] and family that unfortunately patient is requiring increased titrations of levophed  due to patient's hypotension. Patient was originally discharged on hospice yesterday on 04/15/24. Patient came back to ED on evening of 9/5 for respiratory distress. Patient rescinded DNR to become full code, ultimately patient having to be intubated within the ED. Discussed with [name] and family the concern of patient further decompensating and that there is a high chance that the patient may cardiac arrest despite mechanical ventilatory and vasopressor forms of life support. Recommended at least DNR at this time. [name] and family in agreement with full DNR. Did also explain to [name] and family that it appears the patient is in the process of actively dying. If patient continue to worsen, recommend transitioning to comfort measures to prevent further suffering/pain.   Went to wife of patient's ICU room with Pamela/family upon family's request and notified wife that she needed to go see her husband. She asked why and family asked to explain to her the reason. Discussed with wife that unfortunately her husband was on multiple forms of life support and high risk that her husband may unfortunately die. Wife in denial but wanting to see her husband.   Code status: Full DNR  Disposition: Continue current acute care  Allow wife to visit Allow for open visitation   If patient continues to worsen, recommend discussing  with family in regards to transitioning to comfort measures.    Time spent for the meeting: 35 mins     Christian Versia Mignogna AGACNP-BC   [facility] 27-Apr-2024, 1:40 AM  Please see [email or web address] for pager details.  From 7A-7P if no response, please call [phone]. After hours, please call ELink [phone].

## 2024-05-11 NOTE — Progress Notes (Signed)
 NAME:  Ronald Reeves, MRN:  [id number], DOB:  [birth year], LOS: 1 ADMISSION DATE:  04/15/2024, CONSULTATION DATE:  04/15/24  REFERRING MD: Rogelia CHIEF COMPLAINT:  Shortness of breath   History of Present Illness:  Patient is a 68 year old male with a significant past medical history of end-stage renal disease on IHD (TTS), A-fib, HFrEF, cirrhosis, COPD, alcohol  use disorder, cocaine  use disorder, thrombocytopenia, and GERD who presents to [facility] ED on 04/15/2024 with complaints of respiratory distress.  Patient was recently hospitalized on 8/28 to 04/15/2024 due to acute metabolic encephalopathy, who was discharged earlier today on hospice but still pursue hemodialysis.  Patient arrived home by EMS, and began developing shortness of breath along with hemoptysis.  Per daughter at bedside, hemoptysis was minimal at first, sputum began blood-tinged and then patient began producing a couple of ounces of blood per cough.  Patient was transported back to the hospital for respiratory distress where he was initially placed on nonrebreather and transition to BiPAP.  Patient originally a DNR on arrival but rescinded his DNR per ED physician and ultimately agreed with intubation for respiratory distress and not being able to tolerate BiPAP.  PCCM consulted for admission and further management of patient.  Labs taking on morning of 04/15/2024 at 1149-sodium 136, potassium 4.7, chloride 92, CO2 is 25, glucose 110, BUN 66, fattening 5.42, calcium  9.4, anion gap 19  Last CBC on previous admission 9/3-WBC 8, hemoglobin 12.2, hematocrit 36.2, platelets 161  Current ED labs per i-STAT, 2044 sodium 132, potassium 4.7, chloride 97, glucose 91, BUN 66, creatinine 5.10, ionized calcium  1.07, hemoglobin 14.3, hematocrit 42  Chest x-ray, CT angio of chest to rule out PE, CT of head, pending as well as other labs  Upon initial assessment patient, patient severely cachectic and deconditioned.  Daughter and niece at bedside,  emotional.  Patient is on Levophed  infusion at 10 mcg/h, A-line placed.  Patient has right HD catheter, clean at the site. Discussed with daughter and niece at bedside, that unfortunately patient is acute on chronically ill, and suspect patient is unfortunately in the dying process.  Recommended to daughter and niece, that were discussed and wants the wife and family about transitioning patient back to DNR/DNI especially if patient worsens and proceeds into a cardiac arrest.  Currently, wife is also a patient here at [facility] within the ICU.  Daughter is concerned of mother's capacity to make decisions in regards to patient's goals of care and CODE STATUS.  Pertinent  Medical History   Past Medical History:  Diagnosis Date   Anemia    Anxiety    Arthritis    Back pain    Bronchitis    COPD (chronic obstructive pulmonary disease) (HCC)    Coronary artery disease    COVID    mild - flu like symptoms   Dyspnea    w/ exertion, uses inhaler   ESRD on hemodialysis (HCC)    dialysis on tues, thurs, sat at [facility]   GERD (gastroesophageal reflux disease)    not a current problem   Heart murmur    never has caused any problems   Hypertension    Myocardial infarction (HCC)    Pneumonia    x 1   Pre-diabetes    diet controlled, no meds, does not check blood sugar   Substance abuse (HCC)      Significant Hospital Events: Including procedures, antibiotic start and stop dates in addition to other pertinent events   9/5 discharge  1532, home with hospice but plan to still obtain hemodialysis 9/5 patient returns to ED in respiratory distress/hemoptysis, ultimately intubated, PCCM consulted for admission  Interim History / Subjective:   Remains on peripheral levophed  Family in room Discussed GOC with family. Plan for transition to comfort care when remaining family arrives today  Objective    Blood pressure 132/66, pulse 87, temperature 99.5 F (37.5 C), resp. rate (!) 26, SpO2 (!)  84%.    Vent Mode: PRVC FiO2 (%):  [40 %-100 %] 40 % Set Rate:  [15 bmp-18 bmp] 18 bmp Vt Set:  [570 mL] 570 mL PEEP:  [5 cmH20] 5 cmH20 Plateau Pressure:  [22 cmH20-28 cmH20] 22 cmH20   Intake/Output Summary (Last 24 hours) at 04-22-24 0727 Last data filed at 04-22-2024 0600 Gross per 24 hour  Intake 470.19 ml  Output --  Net 470.19 ml   There were no vitals filed for this visit.  Physical Exam: General: Critically ill-appearing, cachectic HENT: [place], AT, ETT in place Eyes: EOMI, no scleral icterus Respiratory: Mildly tachypneic. Diminished to auscultation bilaterally.  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  Per discharge note-patient desired to go home on 9/2 with home hospice but continuing hemodialysis Palliative care was also involved with goals of care discussions on previous admission with patient and also/son in regards to his poor prognosis and end-of-life care Per discharge note patient was stable upon discharge and this was discussed with son, where son in agreement with honoring father's wishes of going home with hospice and taking care of him  along with his sister P: Plan to transition to comfort care with compassionate extubation when family arrives   Critical care time: 30 mins    The patient is critically ill with multiple organ systems failure and requires high complexity decision making for assessment and support, frequent evaluation and titration of therapies, application of advanced monitoring technologies and extensive interpretation of multiple databases.  Independent Critical Care Time: 30 Minutes.   Slater Staff, M.D. [facility] 2024/04/22 7:27 AM   Please see Amion for pager number to reach on-call Pulmonary and Critical Care Team.

## 2024-05-11 NOTE — Progress Notes (Signed)
 Late Note Entry- April 18, 2024  Noted pt passed away on 15-May-2024. Contacted [facility] to be advised of pt's passing.   Randine Mungo Dialysis Navigator [phone]

## 2024-05-11 NOTE — Progress Notes (Signed)
  Interdisciplinary Goals of Care Family Meeting   Date carried out:: May 15, 2024  Location of the meeting: Bedside  Member's involved: Physician, Nurse Practitioner, and Family Member or next of kin  Durable Power of Attorney or acting medical decision maker: [name] (oldest daughter)   Discussion: We discussed goals of care for Ronald Reeves .  Came to bedside with MD Kassie to discuss with [name] and other children of patient in regards to further goals of care.  Their mother/wife of patient, (also a patient within the ICU), able to visit the patient at her request as well as the children's request early this morning until now.  Unfortunately, the wife of the patient unable to make medical decisions for the patient/husband due to her critical illness. And therefore Pamela/family having to make medical decisions upon her behalf in regards to the patient/their father. The goal of allowing the wife of patient to have meaningful time with the patient has been accomplished. [name], and family agree that it is time for their mother to return to her room and rest. As well as began transitioning the patient to comfort care early today when the rest of the family has arrived.   Code status: Full DNR  Disposition: Continue current acute care- Planning to transition to comfort when other family members arrives  Return wife of patient back to her ICU room  Continue to provide emotional and spiritual support to wife as well as children during this difficult time  Allow for open visitation  Prepare for comfort    Time spent for the meeting: 15 mins   Christian Claudene AGACNP-BC   [facility] May 15, 2024, 7:39 AM  Please see [email or web address] for pager details.  From 7A-7P if no response, please call [phone]. After hours, please call ELink [phone].

## 2024-05-11 NOTE — Death Summary Note (Signed)
 DEATH SUMMARY   Patient Details  Name: Ronald Reeves MRN: [id number] DOB: 1956-07-11  Admission/Discharge Information   Admit Date:  05-14-24  Date of Death: Date of Death: 2024/05/15  Time of Death: Time of Death: 1125  Length of Stay: 1  Referring Physician: [facility]   Reason(s) for Hospitalization  Hemoptysis  Diagnoses  Preliminary cause of death: End stage renal disease Secondary Diagnoses (including complications and co-morbidities):  Principal Problem:   Acute respiratory failure (HCC) Active Problems:   HFrEF (heart failure with reduced ejection fraction) (HCC)   ESRD (end stage renal disease) (HCC)   Paroxysmal atrial fibrillation (HCC)   Thrombocytopenia (HCC)   Moderate protein-calorie malnutrition (HCC)   Hemoptysis   Shock (HCC)   Cirrhosis (HCC)  Brief Hospital Course (including significant findings, care, treatment, and services provided and events leading to death)  Ronald Reeves is a 68 y.o. year old male with ESRD, HFrEF, cirrhosis, Afib, COPD, lcohol use disorder, cocaine  use disorder, thrombocytopenia, and GERD who presents to [facility] ED on May 14, 2024 with complaints of respiratory distress. Patient was recently hospitalized on 8/28 to 14-May-2024 due to acute metabolic encephalopathy, who was discharged on 05-14-24 to hospice with plans to continue dialysis. Patient arrived home by EMS, and began developing shortness of breath along with hemoptysis. Per daughter at bedside, hemoptysis was minimal at first, sputum began blood-tinged and then patient began producing a couple of ounces of blood per cough. Patient was transported back to the hospital for respiratory distress where he was initially placed on nonrebreather and transition to BiPAP. Patient originally a DNR on arrival but rescinded his DNR per ED physician and ultimately agreed with intubation for respiratory distress.   PCCM consulted. Discussed with daughter and niece at bedside, that unfortunately  patient is acute on chronically ill, and suspect patient is unfortunately in the dying process. Decision made by family to pursue comfort care. Patient was compassionately extubated and expired at 11:25 am on 05-15-2024.  Pertinent Labs and Studies  Significant Diagnostic Studies DG Chest Portable 1 View Result Date: 05-14-2024 EXAM: 1 VIEW(S) XRAY OF THE CHEST 05-14-2024 09:46:00 PM COMPARISON: Comparison to 04/07/2024. CLINICAL HISTORY: Shortness of breath. FINDINGS: LUNGS AND PLEURA: Patchy airspace opacities in the right mid lung and left lower lung are increased compared to 04/08/2023. No definite pleural effusion. No pneumothorax. HEART AND MEDIASTINUM: Stable cardiomegaly. LINES AND TUBES: Right IJ cvc tip in the right atrium. Endotracheal Tube tip in the intrathoracic trachea 3.0 cm from the carina. Subdiaphragmatic enteric tube. BONES AND SOFT TISSUES: Aortic atherosclerotic calcification. IMPRESSION: 1. Increased patchy airspace opacities in the right mid lung and left lower lung compared to 04/07/2024. 2. Stable cardiomegaly. 3. Lines and tubes as above. Electronically signed by: Norman Gatlin MD May 14, 2024 09:52 PM EDT RP Workstation: [id number]   US  ABDOMEN LIMITED WITH LIVER DOPPLER Result Date: 04/11/2024 CLINICAL DATA:  Cirrhosis. EXAM: DUPLEX ULTRASOUND OF LIVER TECHNIQUE: Color and duplex Doppler ultrasound was performed to evaluate the hepatic in-flow and out-flow vessels. COMPARISON:  CT abdomen 05/21/2023 and limited duplex exam from 04/08/2024 FINDINGS: Liver: Perihepatic ascites. Liver contour is mildly irregular without gross nodularity. Ultrasound findings are not definitive for cirrhosis. There may be a small amount of adherent sludge in the gallbladder. No definite stones. Main Portal Vein size: 0.9 cm Portal Vein Velocities Main Prox:  43 cm/sec Main Mid: 58 cm/sec Main Dist:  44 cm/sec Right: 33 cm/sec Left: 48 cm/sec Hepatic Vein Velocities Right:  123 cm/sec Middle:  59  cm/sec Left:   21 cm/sec IVC: Present and patent with normal respiratory phasicity. Hepatic Artery Velocity:  135 cm/sec Splenic Vein Velocity:  13 cm/sec Spleen: 8.3 cm x 3.9 cm x 3.5 cm with a total volume of 60 cm^3 (411 cm^3 is upper limit normal) Portal Vein Occlusion/Thrombus: No Splenic Vein Occlusion/Thrombus: No Ascites: Present Varices: None Normal hepatopetal flow in the portal veins. Normal hepatofugal flow in the hepatic veins. No biliary dilatation. Common bile duct measures 0.6 cm. IMPRESSION: 1. Portal venous system is patent with normal direction of flow. 2. Ascites. Electronically Signed   By: Juliene Balder M.D.   On: 04/11/2024 08:08   US  ABDOMEN LIMITED WITH LIVER DOPPLER Result Date: 04/08/2024 CLINICAL DATA:  Ascites. EXAM: DUPLEX ULTRASOUND OF LIVER TECHNIQUE: Color and duplex Doppler ultrasound was attempted to evaluate the hepatic in-flow and out-flow vessels. Patient terminated the exam prior to completion. COMPARISON:  None Available. FINDINGS: Liver: Not well assessed on the current exam. Main Portal Vein size: Not assessed on the current exam. Portal Vein Velocities Patient terminated the exam prior to assessment. Hepatic Vein Velocities Right:  53 cm/sec Middle:  36.8 cm/sec Left: Not assessed, patient terminated the exam prior to assessment. IVC: Not assessed, patient terminated the exam prior to assessment. Hepatic Artery Velocity: Not assessed, patient terminated the exam prior to assessment. Splenic Vein Velocity: Not assessed, patient terminated the exam prior to assessment. Spleen: Not assessed. Portal Vein Occlusion/Thrombus: Not assessed. Splenic Vein Occlusion/Thrombus: Not assessed. Ascites: Present in the right upper quadrant. Varices: Not assessed. IMPRESSION: 1. Right upper quadrant ascites. 2. Patent right and middle hepatic veins. Patient terminated the exam prior to interrogation of the remaining vasculature. Electronically Signed   By: Andrea Gasman M.D.   On: 04/08/2024 21:30    DG Chest Portable 1 View Result Date: 04/07/2024 EXAM: 1 VIEW XRAY OF THE CHEST 04/07/2024 11:04:35 AM COMPARISON: 04/06/2024 CLINICAL HISTORY: SOB, dialysis. Per chart - Pt here for a unwitnessed fall. Pt unsure if he hit his head. Denies LOC. No lacerations or abrasions noted to head. Pt is on eliquis  and plavix. Axox4. Pts last full dialysis treatment was yesterday per pt. Pt states he did cocaine  last night. FINDINGS: LUNGS AND PLEURA: Left basilar airspace opacities are improving. No pleural effusion. No pneumothorax. HEART AND MEDIASTINUM: The heart is enlarged. Atherosclerotic changes are again noted at the aortic arch. BONES AND SOFT TISSUES: No acute osseous abnormality. LINES AND TUBES: A right IJ dialysis catheter is stable and in appropriate position. IMPRESSION: 1. Improving left basilar airspace opacities. 2. Enlarged heart. 3. Atherosclerotic changes at the aortic arch. Electronically signed by: [name] Necessary MD 04/07/2024 11:13 AM EDT RP Workstation: [id number]   CT Head Wo Contrast Result Date: 04/07/2024 CLINICAL DATA:  Head trauma, minor (Age >= 65y) EXAM: CT HEAD WITHOUT CONTRAST TECHNIQUE: Contiguous axial images were obtained from the base of the skull through the vertex without intravenous contrast. RADIATION DOSE REDUCTION: This exam was performed according to the departmental dose-optimization program which includes automated exposure control, adjustment of the mA and/or kV according to patient size and/or use of iterative reconstruction technique. COMPARISON:  None Available. FINDINGS: Brain: No evidence of acute infarction, hemorrhage, hydrocephalus, extra-axial collection or mass lesion/mass effect. Patchy white matter hypodensities, compatible with chronic microvascular ischemic disease. Vascular: Calcific atherosclerosis. Skull: No acute fracture. Sinuses/Orbits: Mostly clear sinuses.  No orbital fracture. IMPRESSION: No evidence of acute intracranial abnormality.  Electronically Signed   By: Gilmore GORMAN Molt M.D.   On:  04/07/2024 09:53   CT Head Wo Contrast Result Date: 04/06/2024 CLINICAL DATA:  Head trauma, minor (Age >= 65y); Neck trauma (Age >= 65y) Fall. EXAM: CT HEAD WITHOUT CONTRAST CT CERVICAL SPINE WITHOUT CONTRAST TECHNIQUE: Multidetector CT imaging of the head and cervical spine was performed following the standard protocol without intravenous contrast. Multiplanar CT image reconstructions of the cervical spine were also generated. RADIATION DOSE REDUCTION: This exam was performed according to the departmental dose-optimization program which includes automated exposure control, adjustment of the mA and/or kV according to patient size and/or use of iterative reconstruction technique. COMPARISON:  None Available. FINDINGS: CT HEAD FINDINGS Brain: Patchy and confluent areas of decreased attenuation are noted throughout the deep and periventricular white matter of the cerebral hemispheres bilaterally, compatible with chronic microvascular ischemic disease. No evidence of large-territorial acute infarction. No parenchymal hemorrhage. No mass lesion. No extra-axial collection. No mass effect or midline shift. No hydrocephalus. Basilar cisterns are patent. Vascular: No hyperdense vessel. Atherosclerotic calcifications are present within the cavernous internal carotid arteries. Skull: No acute fracture or focal lesion. Chronic right nasal bone fracture. Sinuses/Orbits: Paranasal sinuses and mastoid air cells are clear. The orbits are unremarkable. Other: None. CT CERVICAL SPINE FINDINGS Alignment: Normal. Skull base and vertebrae: Limited evaluation due to motion artifact. Multilevel severe degenerative change of the spine. Moderate to severe C3-C4, C4-C5, C5-C6, and C7-T1 osseous neural foraminal stenosis. No acute fracture. No aggressive appearing focal osseous lesion or focal pathologic process. Soft tissues and spinal canal: No prevertebral fluid or swelling. No  visible canal hematoma. Upper chest: Paraseptal and centrilobular emphysematous changes. Other: None. IMPRESSION: 1. No acute intracranial abnormality. 2. No acute displaced fracture or traumatic listhesis of the cervical spine. Limited evaluation due to motion artifact. Electronically Signed   By: Morgane  Naveau M.D.   On: 04/06/2024 22:44   CT Cervical Spine Wo Contrast Result Date: 04/06/2024 CLINICAL DATA:  Head trauma, minor (Age >= 65y); Neck trauma (Age >= 65y) Fall. EXAM: CT HEAD WITHOUT CONTRAST CT CERVICAL SPINE WITHOUT CONTRAST TECHNIQUE: Multidetector CT imaging of the head and cervical spine was performed following the standard protocol without intravenous contrast. Multiplanar CT image reconstructions of the cervical spine were also generated. RADIATION DOSE REDUCTION: This exam was performed according to the departmental dose-optimization program which includes automated exposure control, adjustment of the mA and/or kV according to patient size and/or use of iterative reconstruction technique. COMPARISON:  None Available. FINDINGS: CT HEAD FINDINGS Brain: Patchy and confluent areas of decreased attenuation are noted throughout the deep and periventricular white matter of the cerebral hemispheres bilaterally, compatible with chronic microvascular ischemic disease. No evidence of large-territorial acute infarction. No parenchymal hemorrhage. No mass lesion. No extra-axial collection. No mass effect or midline shift. No hydrocephalus. Basilar cisterns are patent. Vascular: No hyperdense vessel. Atherosclerotic calcifications are present within the cavernous internal carotid arteries. Skull: No acute fracture or focal lesion. Chronic right nasal bone fracture. Sinuses/Orbits: Paranasal sinuses and mastoid air cells are clear. The orbits are unremarkable. Other: None. CT CERVICAL SPINE FINDINGS Alignment: Normal. Skull base and vertebrae: Limited evaluation due to motion artifact. Multilevel severe  degenerative change of the spine. Moderate to severe C3-C4, C4-C5, C5-C6, and C7-T1 osseous neural foraminal stenosis. No acute fracture. No aggressive appearing focal osseous lesion or focal pathologic process. Soft tissues and spinal canal: No prevertebral fluid or swelling. No visible canal hematoma. Upper chest: Paraseptal and centrilobular emphysematous changes. Other: None. IMPRESSION: 1. No acute intracranial abnormality. 2. No acute displaced fracture  or traumatic listhesis of the cervical spine. Limited evaluation due to motion artifact. Electronically Signed   By: Morgane  Naveau M.D.   On: 04/06/2024 22:44   DG Shoulder Right Result Date: 04/06/2024 CLINICAL DATA:  Fall EXAM: RIGHT SHOULDER - 2+ VIEW COMPARISON:  None FINDINGS: Right-sided central venous catheter with tip at the right atrium. Advanced degenerative changes at the [facility] joint. Limited Y-view to assess for dislocation. No definitive fracture. Mild to moderate glenohumeral degenerative change. High-riding humeral head consistent with rotator cuff disease. IMPRESSION: 1. Limited Y-view to assess for dislocation. No definitive fracture. 2. Degenerative changes. High-riding humeral head consistent with rotator cuff disease. Electronically Signed   By: Luke Bun M.D.   On: 04/06/2024 20:58   DG Chest Portable 1 View Result Date: 04/06/2024 CLINICAL DATA:  Fall. EXAM: PORTABLE CHEST 1 VIEW COMPARISON:  Chest radiograph dated 04/01/2024. FINDINGS: Dialysis catheter in similar position. There is cardiomegaly with mild central vascular congestion. No focal consolidation, pleural effusion, or pneumothorax. Atherosclerotic calcification of the aorta. Degenerative changes of the spine. No acute osseous pathology. IMPRESSION: Cardiomegaly with mild central vascular congestion. No focal consolidation. Electronically Signed   By: Vanetta Chou M.D.   On: 04/06/2024 20:29   DG Chest Portable 1 View Result Date: 04/01/2024 CLINICAL DATA:  Short  of breath EXAM: PORTABLE CHEST 1 VIEW COMPARISON:  None Available. FINDINGS: Large-bore central venous line with tip in the RIGHT atrium. Stable large cardiac silhouette. No effusion, infiltrate, or pneumothorax. IMPRESSION: 1. No acute cardiopulmonary process. 2. Large-bore central venous line with tip in the RIGHT atrium. Electronically Signed   By: Jackquline Boxer M.D.   On: 04/01/2024 14:13   CT Head Wo Contrast Result Date: 04/01/2024 CLINICAL DATA:  Syncope/presyncope. Cerebrovascular cause suspected. EXAM: CT HEAD WITHOUT CONTRAST TECHNIQUE: Contiguous axial images were obtained from the base of the skull through the vertex without intravenous contrast. RADIATION DOSE REDUCTION: This exam was performed according to the departmental dose-optimization program which includes automated exposure control, adjustment of the mA and/or kV according to patient size and/or use of iterative reconstruction technique. COMPARISON:  01/24/2024 FINDINGS: Brain: No focal abnormality affecting the brainstem or cerebellum. Chronic small-vessel ischemic changes of the cerebral hemispheric white matter. No sign of acute infarction, mass lesion, hemorrhage, hydrocephalus or extra-axial collection. Vascular: There is atherosclerotic calcification of the major vessels at the base of the brain. Skull: No skull fracture. Sinuses/Orbits: Clear except for a retention cyst in the right division of the sphenoid sinus. Orbits negative. Other: Right frontal scalp swelling. IMPRESSION: 1. No acute intracranial finding. Chronic small-vessel ischemic changes of the cerebral hemispheric white matter. 2. Right frontal scalp swelling. No skull fracture. Electronically Signed   By: Oneil Officer M.D.   On: 04/01/2024 13:27    Microbiology Recent Results (from the past 240 hours)  Blood culture (routine x 2)     Status: None   Collection Time: 04/11/24 10:19 AM   Specimen: BLOOD LEFT ARM  Result Value Ref Range Status   Specimen  Description BLOOD LEFT ARM  Final   Special Requests   Final    BOTTLES DRAWN AEROBIC AND ANAEROBIC Blood Culture results may not be optimal due to an inadequate volume of blood received in culture bottles   Culture   Final    NO GROWTH 5 DAYS Performed at [facility], [address] [place], [place] [address]    Report Status 2024-05-15 FINAL  Final  Blood culture (routine x 2)  Status: None   Collection Time: 04/11/24 11:18 AM   Specimen: BLOOD  Result Value Ref Range Status   Specimen Description BLOOD LEFT ANTECUBITAL  Final   Special Requests   Final    BOTTLES DRAWN AEROBIC ONLY Blood Culture results may not be optimal due to an inadequate volume of blood received in culture bottles   Culture   Final    NO GROWTH 5 DAYS Performed at [facility], [address], [place], [place] [address]    Report Status 2024-05-09 FINAL  Final  C Difficile Quick Screen (NO PCR Reflex)     Status: None   Collection Time: 04/11/24 11:53 AM   Specimen: STOOL  Result Value Ref Range Status   C Diff antigen NEGATIVE NEGATIVE Final   C Diff toxin NEGATIVE NEGATIVE Final   C Diff interpretation No C. difficile detected.  Final    Comment: Performed at [facility], [address], [place], [place] [address]  MRSA Next Gen by PCR, Nasal     Status: None   Collection Time: 04/15/24 11:00 PM   Specimen: Nasal Mucosa; Nasal Swab  Result Value Ref Range Status   MRSA by PCR Next Gen NOT DETECTED NOT DETECTED Final    Comment: (NOTE) The GeneXpert MRSA Assay (FDA approved for NASAL specimens only), is one component of a comprehensive MRSA colonization surveillance program. It is not intended to diagnose MRSA infection nor to guide or monitor treatment for MRSA infections. Test performance is not FDA approved in patients less than 74 years old. Performed at [facility], [address], [place], [place] [address]   Culture, blood (Routine X 2) w Reflex to ID Panel      Status: None (Preliminary result)   Collection Time: 04/15/24 11:30 PM   Specimen: BLOOD LEFT HAND  Result Value Ref Range Status   Specimen Description BLOOD LEFT HAND  Final   Special Requests   Final    BOTTLES DRAWN AEROBIC AND ANAEROBIC Blood Culture results may not be optimal due to an inadequate volume of blood received in culture bottles   Culture  Setup Time   Final    GRAM NEGATIVE RODS IN BOTH AEROBIC AND ANAEROBIC BOTTLES Organism ID to follow Performed at [facility], [address], [place], [place] [address]    Culture GRAM NEGATIVE RODS  Final   Report Status PENDING  Incomplete  Culture, blood (Routine X 2) w Reflex to ID Panel     Status: None (Preliminary result)   Collection Time: 04/15/24 11:34 PM   Specimen: BLOOD LEFT HAND  Result Value Ref Range Status   Specimen Description BLOOD LEFT HAND  Final   Special Requests   Final    BOTTLES DRAWN AEROBIC ONLY Blood Culture results may not be optimal due to an inadequate volume of blood received in culture bottles   Culture   Final    NO GROWTH < 12 HOURS Performed at [facility], [address], [place], [place] [address]    Report Status PENDING  Incomplete    Lab Basic Metabolic Panel: Recent Labs  Lab 04/12/24 1949 04/14/24 0459 04/14/24 1611 04/15/24 1149 04/15/24 1902 04/15/24 2042 04/15/24 2044 04/15/24 2051 04/15/24 2311  NA 133*  --  136 136 131* 134* 132* 131* 136  K 3.9  --  4.0 4.7 4.6 4.7 4.7 4.6 5.0  CL 94*  --  91* 92*  --  93*  97*  --  94*  CO2 27  --  28 25  --  26  --   --  23  GLUCOSE 107*  --  148* 110*  --  86 91  --  95  BUN 44*  --  70* 66*  --  75* 66*  --  76*  CREATININE 3.88*  --  5.59* 5.42*  --  5.58* 5.10*  --  5.45*  CALCIUM  9.0  --  8.9 9.4  --  9.0  --   --  8.7*  MG  --  2.1  --   --   --  1.8  --   --  1.8  PHOS  --   --  4.0  --   --   --   --   --  4.2   Liver Function Tests: Recent Labs  Lab 04/12/24 1949 04/14/24 1611 04/15/24 2042  AST 21   --  17  ALT 18  --  14  ALKPHOS 110  --  107  BILITOT 0.7  --  0.9  PROT 6.0*  --  6.2*  ALBUMIN  2.3* 2.1* 2.0*   No results for input(s): LIPASE, AMYLASE in the last 168 hours. Recent Labs  Lab 04/15/24 2042  AMMONIA 32   CBC: Recent Labs  Lab 04/10/24 0957 04/12/24 0830 04/13/24 1841 04/14/24 1611 04/15/24 0822 04/15/24 1902 04/15/24 2042 04/15/24 2044 04/15/24 2051 04/15/24 2311  WBC 5.3   < > 10.8* 8.0 9.2  --  6.7  --   --  5.1  NEUTROABS 4.4  --   --   --   --   --  6.2  --   --   --   HGB 13.1   < > 13.4 12.2* 13.1 13.6 12.6* 14.3 13.6 12.3*  HCT 38.8*   < > 39.6 36.2* 38.4* 40.0 37.8* 42.0 40.0 36.2*  MCV 78.1*   < > 78.9* 77.8* 77.3*  --  78.6*  --   --  77.0*  PLT 122*   < > 104* 161 132*  --  162  --   --  154   < > = values in this interval not displayed.   Cardiac Enzymes: No results for input(s): CKTOTAL, CKMB, CKMBINDEX, TROPONINI in the last 168 hours. Sepsis Labs: Recent Labs  Lab 04/14/24 1611 04/15/24 0822 04/15/24 2042 04/15/24 2311  WBC 8.0 9.2 6.7 5.1    Procedures/Operations  Intubation   Ronald Reeves Slater Staff 04/20/2024, 12:47 PM

## 2024-05-11 NOTE — Progress Notes (Signed)
 In preparation of emergent ED intubation ketamine  ordered 1mg /kg (~52mg ) EDP gave verbal ok to change the order to 50mg  for ease of dosing. The orders did not cross over into pyxis and a positive 1 syringe discrepancy was created. Only 1 syringe was pulled from ED-GRN pyxis, and no waste was created. Discrepancy in Pyxis resolved with ED RN and narcotic technician [place] D).  Koren Or, PharmD Clinical Pharmacist 04-22-2024 5:11 PM Please check AMION for all [facility] numbers

## 2024-05-11 DEATH — deceased

## 2024-05-24 ENCOUNTER — Encounter (HOSPITAL_COMMUNITY)

## 2024-05-24 ENCOUNTER — Ambulatory Visit: Admitting: Vascular Surgery
